# Patient Record
Sex: Female | Born: 1955 | Race: White | Hispanic: No | Marital: Married | Smoking: Former smoker
Health system: Southern US, Community
[De-identification: ages and names within clinical notes are randomized; demographics above are authoritative.]

## PROBLEM LIST (undated history)

## (undated) DIAGNOSIS — D219 Benign neoplasm of connective and other soft tissue, unspecified: Secondary | ICD-10-CM

## (undated) DIAGNOSIS — Z789 Other specified health status: Secondary | ICD-10-CM

## (undated) DIAGNOSIS — Z923 Personal history of irradiation: Secondary | ICD-10-CM

## (undated) DIAGNOSIS — Z808 Family history of malignant neoplasm of other organs or systems: Secondary | ICD-10-CM

## (undated) DIAGNOSIS — I10 Essential (primary) hypertension: Secondary | ICD-10-CM

## (undated) DIAGNOSIS — Z8049 Family history of malignant neoplasm of other genital organs: Secondary | ICD-10-CM

## (undated) DIAGNOSIS — Z800 Family history of malignant neoplasm of digestive organs: Secondary | ICD-10-CM

## (undated) DIAGNOSIS — R42 Dizziness and giddiness: Secondary | ICD-10-CM

## (undated) DIAGNOSIS — Z460 Encounter for fitting and adjustment of spectacles and contact lenses: Secondary | ICD-10-CM

## (undated) DIAGNOSIS — Z9889 Other specified postprocedural states: Secondary | ICD-10-CM

## (undated) DIAGNOSIS — R197 Diarrhea, unspecified: Secondary | ICD-10-CM

## (undated) DIAGNOSIS — R112 Nausea with vomiting, unspecified: Secondary | ICD-10-CM

## (undated) DIAGNOSIS — N83201 Unspecified ovarian cyst, right side: Secondary | ICD-10-CM

## (undated) DIAGNOSIS — C50911 Malignant neoplasm of unspecified site of right female breast: Secondary | ICD-10-CM

## (undated) HISTORY — PX: BLADDER SUSPENSION: SHX72

## (undated) HISTORY — PX: COLONOSCOPY: SHX174

## (undated) HISTORY — PX: TUBAL LIGATION: SHX77

## (undated) HISTORY — DX: Benign neoplasm of connective and other soft tissue, unspecified: D21.9

## (undated) HISTORY — DX: Diarrhea, unspecified: R19.7

## (undated) HISTORY — DX: Family history of malignant neoplasm of other organs or systems: Z80.8

## (undated) HISTORY — PX: CHOLECYSTECTOMY: SHX55

## (undated) HISTORY — DX: Family history of malignant neoplasm of digestive organs: Z80.0

## (undated) HISTORY — DX: Essential (primary) hypertension: I10

## (undated) HISTORY — DX: Unspecified ovarian cyst, right side: N83.201

## (undated) HISTORY — DX: Family history of malignant neoplasm of other genital organs: Z80.49

## (undated) HISTORY — DX: Dizziness and giddiness: R42

## (undated) HISTORY — PX: BREAST SURGERY: SHX581

---

## 1998-10-07 ENCOUNTER — Other Ambulatory Visit: Admission: RE | Admit: 1998-10-07 | Discharge: 1998-10-07 | Payer: Self-pay | Admitting: Obstetrics and Gynecology

## 1999-10-10 ENCOUNTER — Other Ambulatory Visit: Admission: RE | Admit: 1999-10-10 | Discharge: 1999-10-10 | Payer: Self-pay | Admitting: Obstetrics and Gynecology

## 1999-10-22 ENCOUNTER — Ambulatory Visit (HOSPITAL_COMMUNITY): Admission: RE | Admit: 1999-10-22 | Discharge: 1999-10-22 | Payer: Self-pay | Admitting: Obstetrics and Gynecology

## 2000-10-29 ENCOUNTER — Other Ambulatory Visit: Admission: RE | Admit: 2000-10-29 | Discharge: 2000-10-29 | Payer: Self-pay | Admitting: Obstetrics and Gynecology

## 2001-11-07 ENCOUNTER — Other Ambulatory Visit: Admission: RE | Admit: 2001-11-07 | Discharge: 2001-11-07 | Payer: Self-pay | Admitting: Obstetrics and Gynecology

## 2002-06-12 ENCOUNTER — Encounter: Admission: RE | Admit: 2002-06-12 | Discharge: 2002-06-12 | Payer: Self-pay | Admitting: Family Medicine

## 2002-06-12 ENCOUNTER — Encounter: Payer: Self-pay | Admitting: Family Medicine

## 2002-11-21 ENCOUNTER — Other Ambulatory Visit: Admission: RE | Admit: 2002-11-21 | Discharge: 2002-11-21 | Payer: Self-pay | Admitting: Obstetrics and Gynecology

## 2003-07-23 ENCOUNTER — Ambulatory Visit (HOSPITAL_BASED_OUTPATIENT_CLINIC_OR_DEPARTMENT_OTHER): Admission: RE | Admit: 2003-07-23 | Discharge: 2003-07-23 | Payer: Self-pay | Admitting: Urology

## 2003-07-23 ENCOUNTER — Observation Stay (HOSPITAL_COMMUNITY): Admission: EM | Admit: 2003-07-23 | Discharge: 2003-07-24 | Payer: Self-pay | Admitting: Urology

## 2003-08-13 ENCOUNTER — Emergency Department (HOSPITAL_COMMUNITY): Admission: EM | Admit: 2003-08-13 | Discharge: 2003-08-13 | Payer: Self-pay | Admitting: Emergency Medicine

## 2003-12-03 ENCOUNTER — Other Ambulatory Visit: Admission: RE | Admit: 2003-12-03 | Discharge: 2003-12-03 | Payer: Self-pay | Admitting: Obstetrics and Gynecology

## 2004-12-23 ENCOUNTER — Other Ambulatory Visit: Admission: RE | Admit: 2004-12-23 | Discharge: 2004-12-23 | Payer: Self-pay | Admitting: Obstetrics and Gynecology

## 2005-06-29 ENCOUNTER — Emergency Department (HOSPITAL_COMMUNITY): Admission: EM | Admit: 2005-06-29 | Discharge: 2005-06-30 | Payer: Self-pay | Admitting: Emergency Medicine

## 2005-06-29 IMAGING — CR DG FINGER THUMB 2+V*L*
3 series · 3 of 3 positions shown · non-contrast
Comparison: none

CLINICAL DATA: Horse bit thumb.
 LEFT THUMB ? 3 VIEW:
 There is no evidence of fracture or dislocation.  There is no evidence of arthropathy or other focal bone abnormality.  Soft tissues are unremarkable.

[view not recorded (1 of 3)]
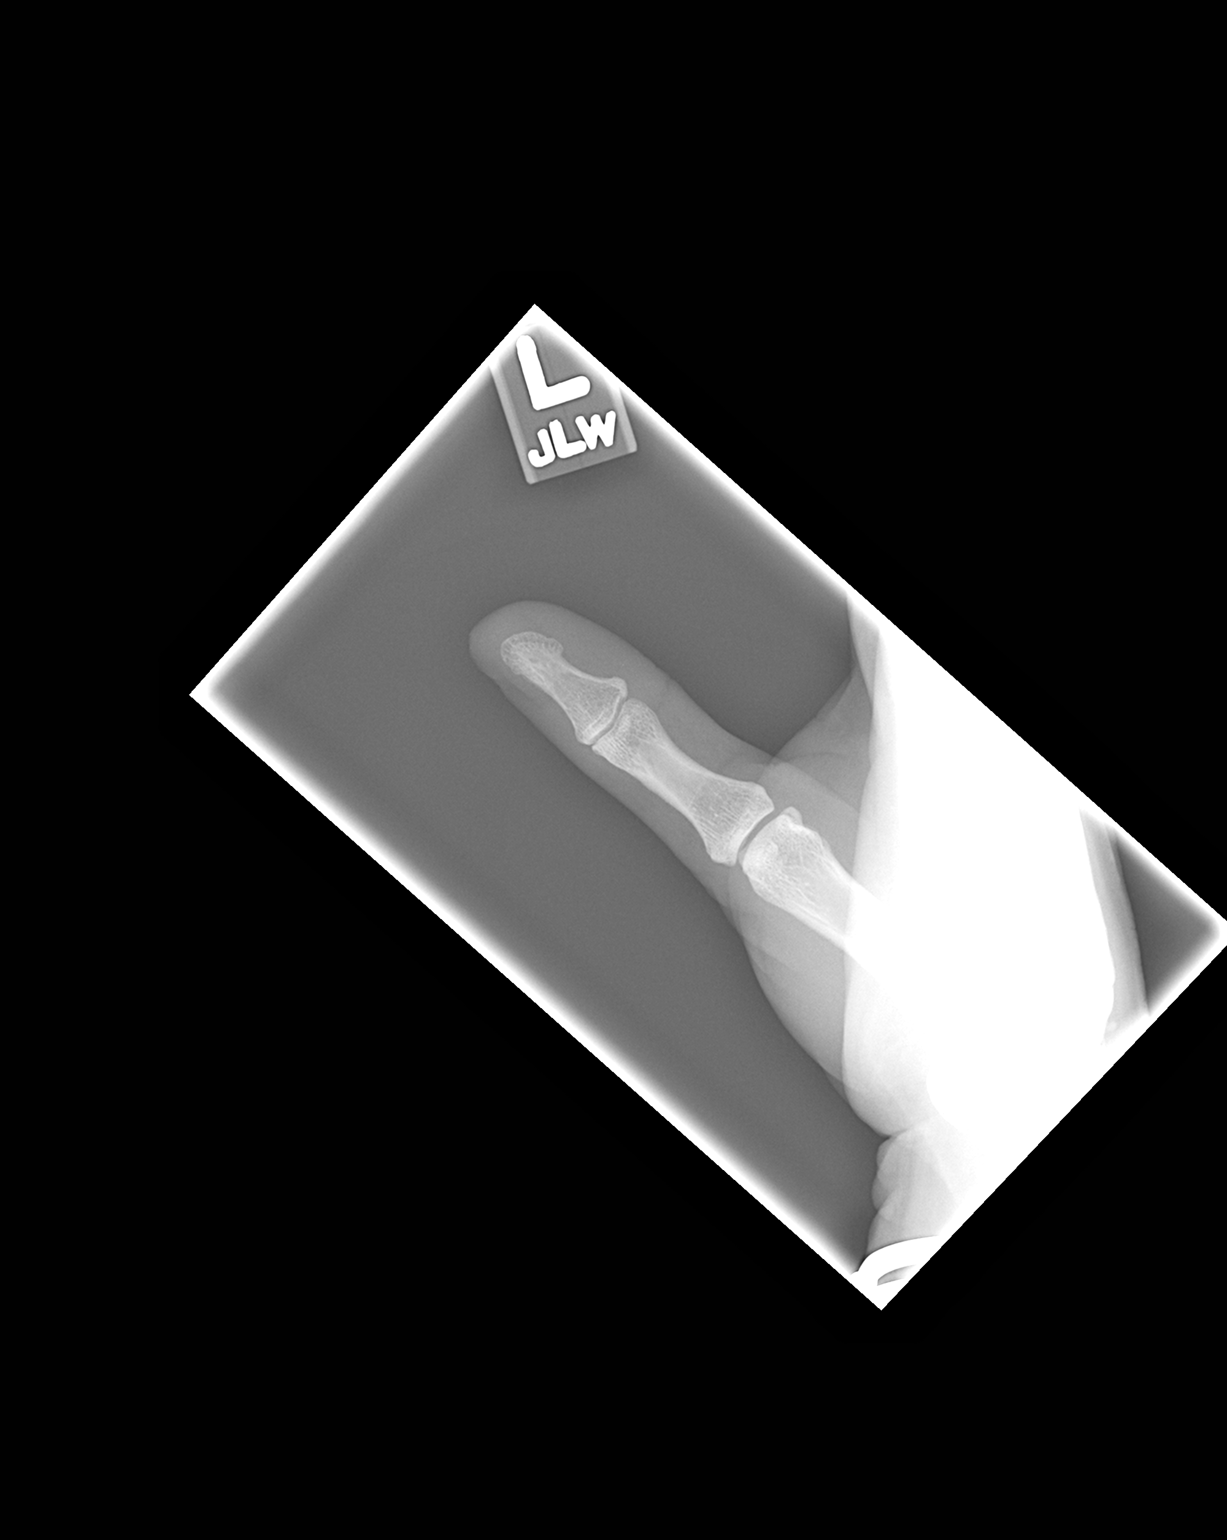

[view not recorded (2 of 3)]
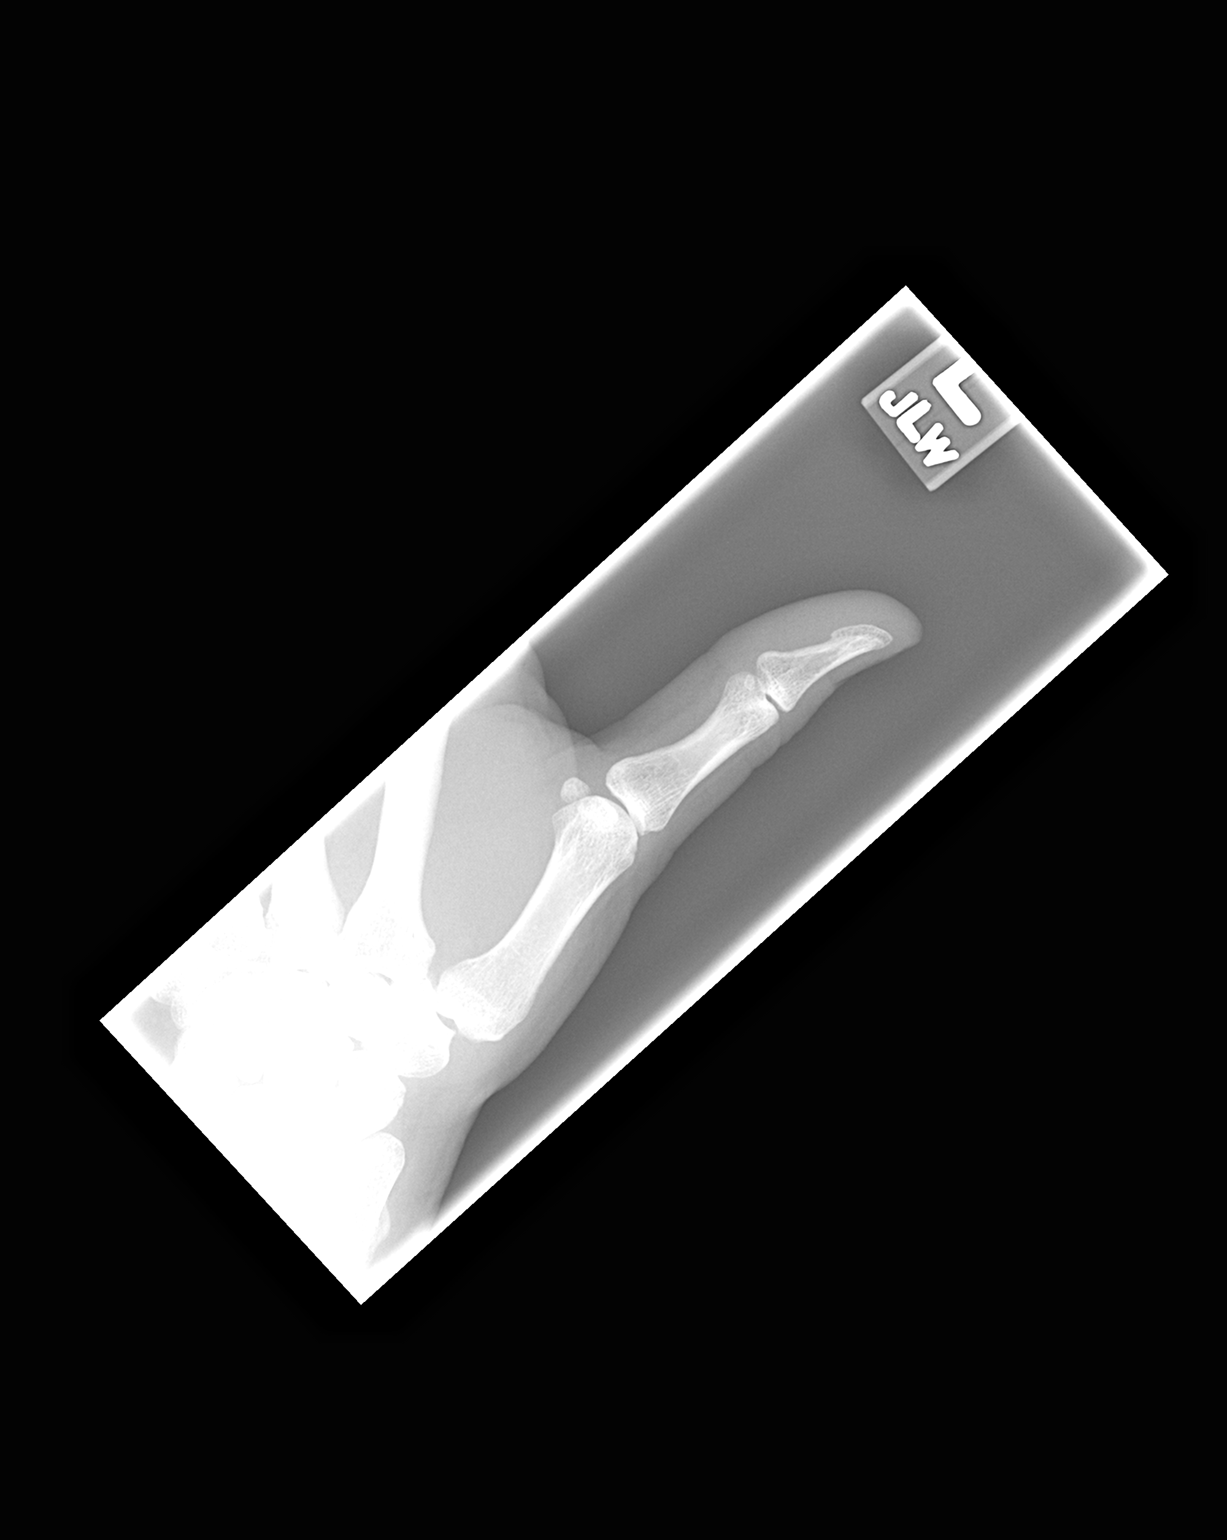

[view not recorded (3 of 3)]
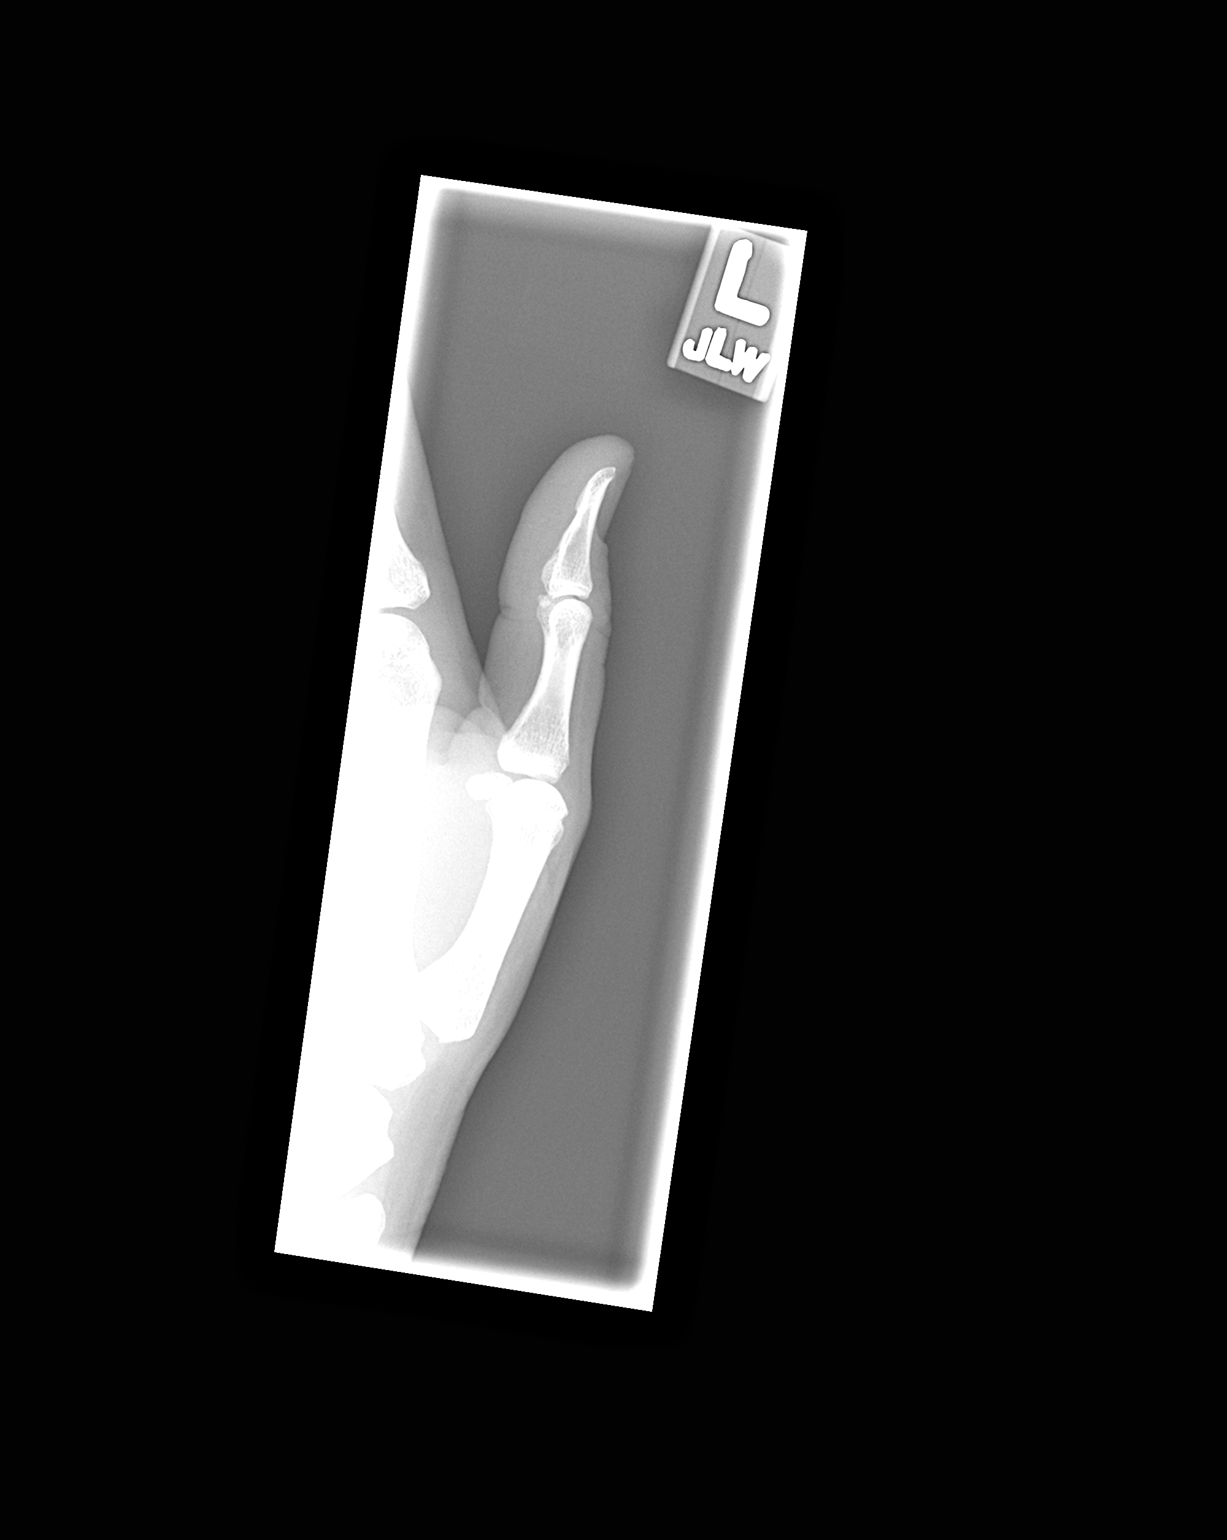

[3 of 3 positions shown; findings below may reference images not displayed]

IMPRESSION: Negative.

## 2006-12-13 ENCOUNTER — Ambulatory Visit: Payer: Self-pay | Admitting: Vascular Surgery

## 2007-01-18 ENCOUNTER — Ambulatory Visit: Payer: Self-pay | Admitting: Vascular Surgery

## 2007-01-25 ENCOUNTER — Ambulatory Visit: Payer: Self-pay | Admitting: Vascular Surgery

## 2007-12-01 ENCOUNTER — Ambulatory Visit: Payer: Self-pay | Admitting: Vascular Surgery

## 2008-12-19 IMAGING — CR DG HAND COMPLETE 3+V*R*
3 series · 3 of 3 positions shown · non-contrast
Comparison: None

CLINICAL DATA: Right hand injury.  Kicked by a horse.

RIGHT HAND - COMPLETE 3+ VIEW

[view not recorded (1 of 3)]
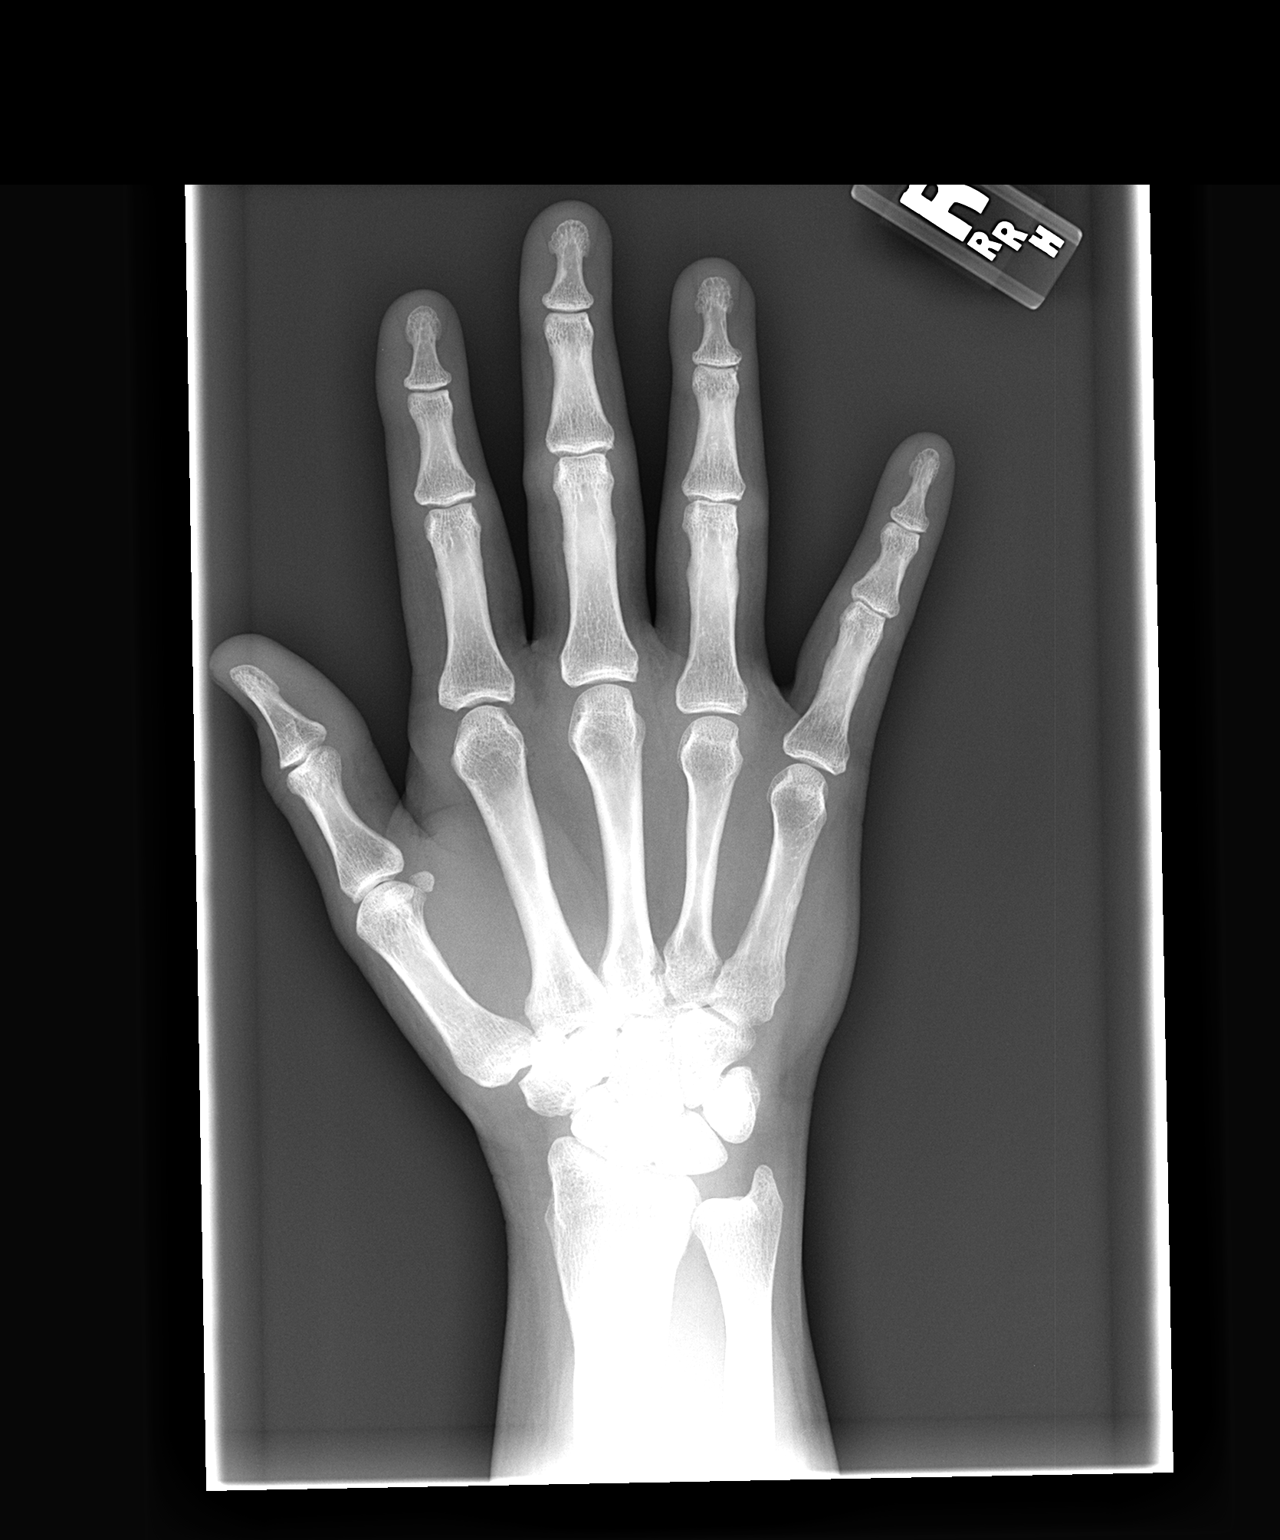

[view not recorded (2 of 3)]
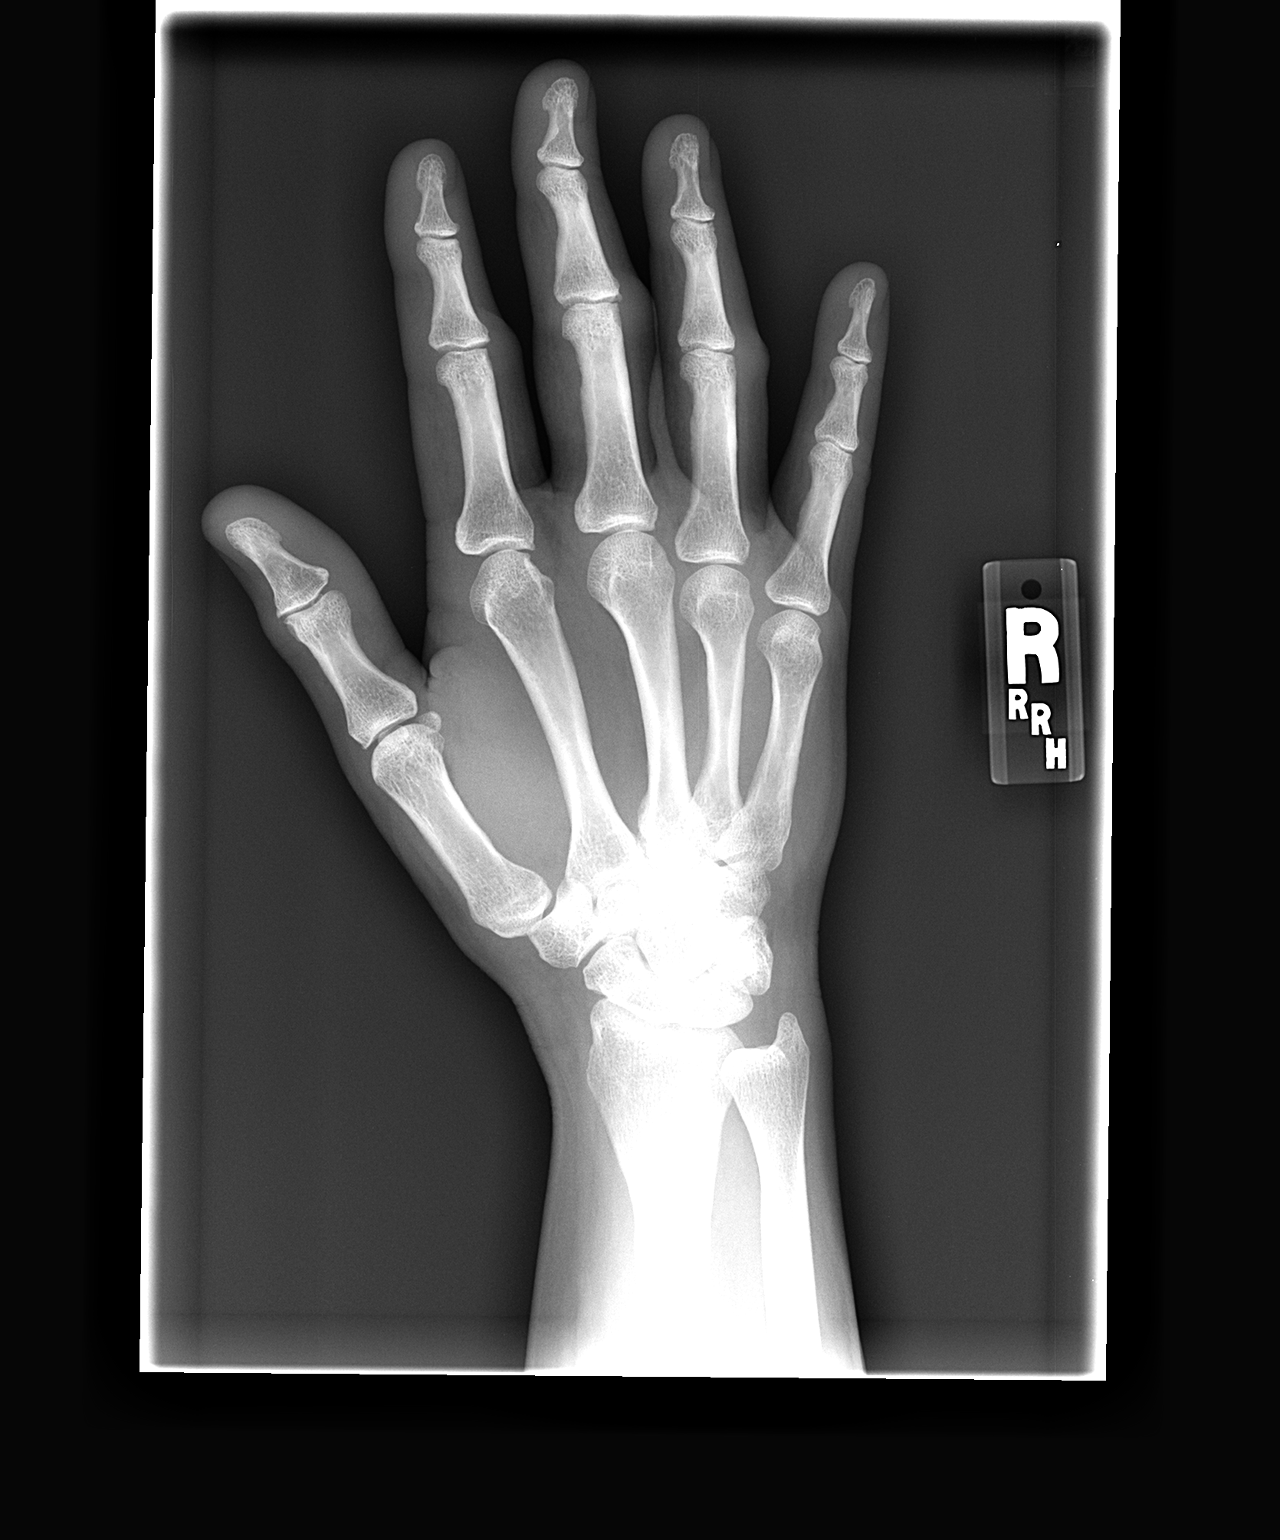

[view not recorded (3 of 3)]
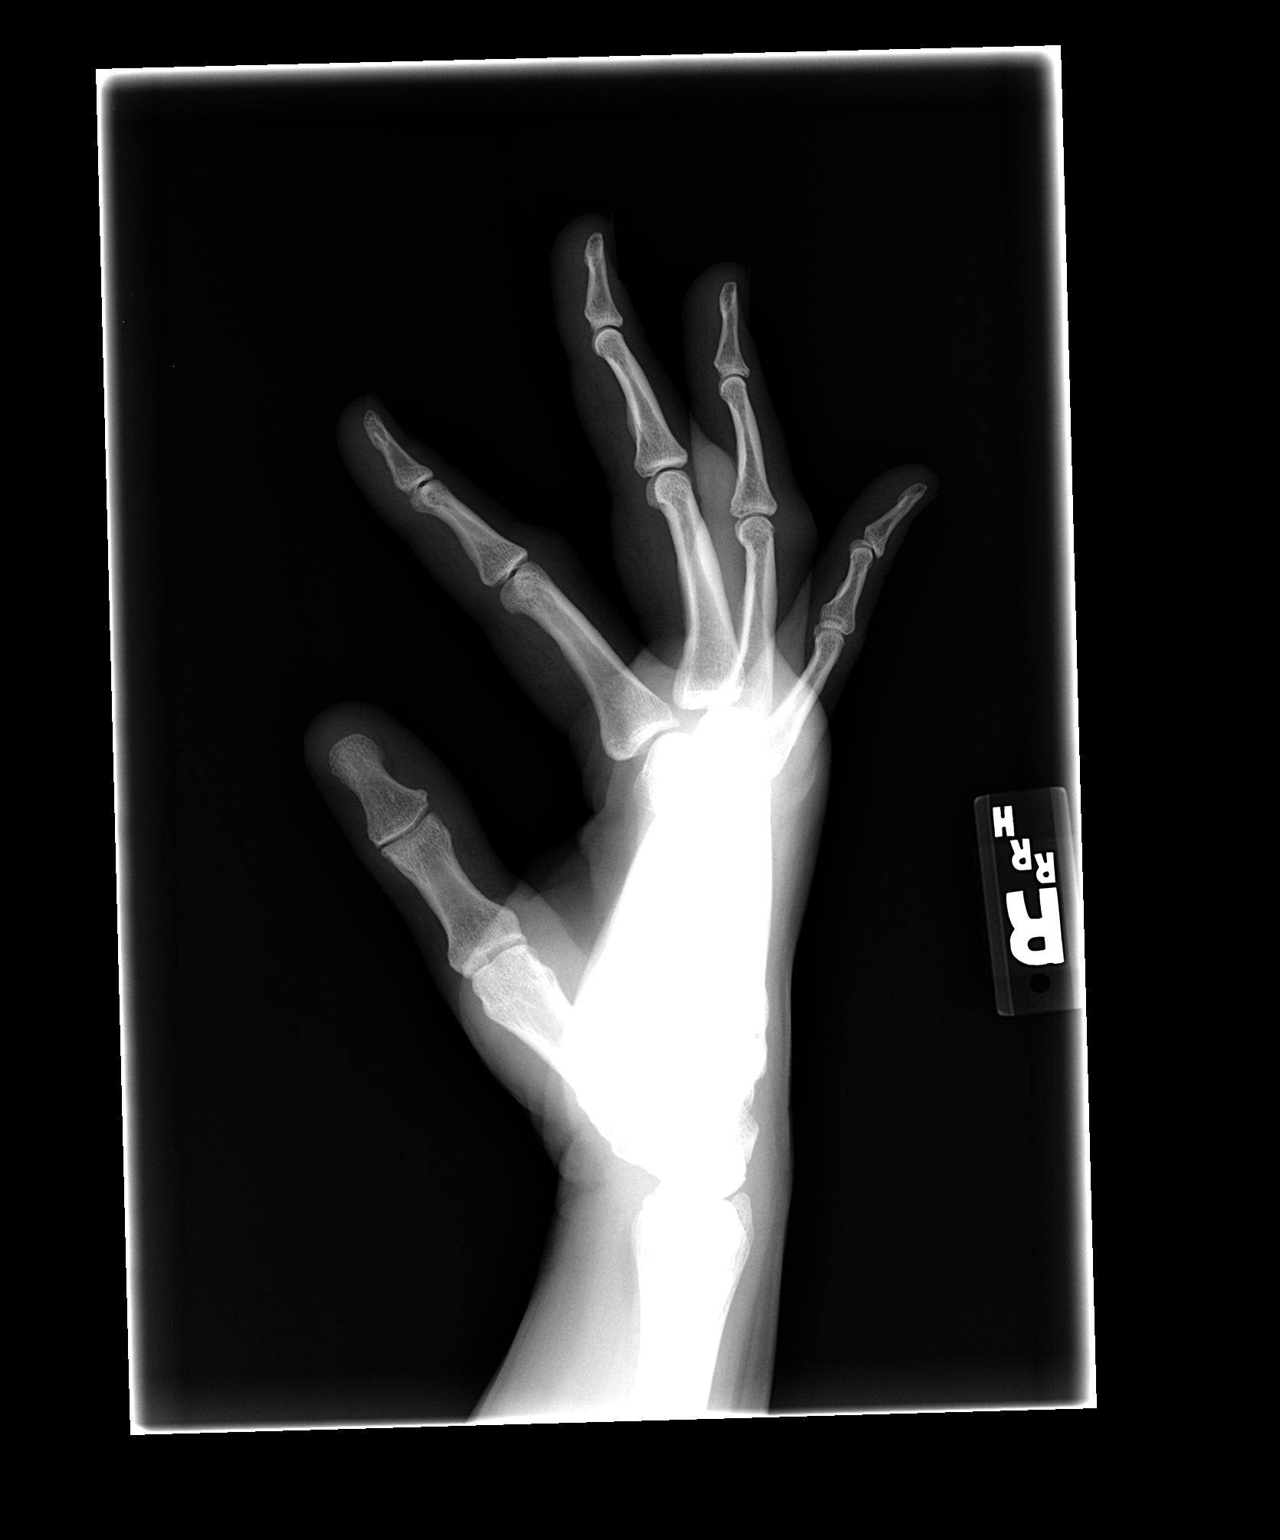

[3 of 3 positions shown; findings below may reference images not displayed]

FINDINGS: No fracture or subluxation.
IMPRESSION: Negative right hand.

## 2009-08-26 ENCOUNTER — Emergency Department (HOSPITAL_COMMUNITY): Admission: EM | Admit: 2009-08-26 | Discharge: 2009-08-26 | Payer: Self-pay | Admitting: Emergency Medicine

## 2011-01-16 NOTE — Op Note (Signed)
[facility]  Patient:    Theresa [name], Theresa [name]                        MRN: [id number] Proc. Date: 10/22/99 Adm. Date:  [id number] Attending:  Osborn Coho                           Operative Report  PREOPERATIVE DIAGNOSIS:       Patient desires permanent sterilization.  POSTOPERATIVE DIAGNOSIS:      Patient desires permanent sterilization.  OPERATION:                    Bilateral tubal ligation, application of Hulka clips.  SURGEON:                      Mark E. Dareen Piano, M.D.  ASSISTANT:  ANESTHESIA:                   General endotracheal anesthesia.  ANTIBIOTICS:                  Ancef 1 gram.  COMPLICATIONS:                None.  SPECIMENS:                    None.  ESTIMATED BLOOD LOSS:         Minimal.  FINDINGS:                     The patient has a normal appearing liver and gallbladder.  Appendix was not visualized.  There was no evidence of any adhesions or endometriosis in the pelvis or abdomen.  The patient had normal fallopian tubes and ovaries bilaterally.  Anterior and posterior cul-de-sac appeared to be normal. 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  DESCRIPTION OF PROCEDURE:     The patient was taken to the operating room where she was placed in the dorsal supine position.  A general anesthesia was administered without complications.  She was then placed in the dorsal lithotomy position and prepped with Hibiclens.  Her bladder was drained with a red rubber catheter.  A  Hulka tenaculum was applied to the anterior cervical lip.  The patient was then  draped in the usual fashion for this procedure.  The umbilicus was then injected with 0.25% Marcaine.  A vertical skin incision was made in the umbilicus.  The Veress needle was placed in the peritoneal cavity and 2 liters of carbon dioxide used to insufflate the abdomen.  The 12 mm trocar was the placed in the peritoneal cavity.  The scope  was then placed.  Examination of the abdominal and pelvic contents was then undertaken with findings as noted above.  A Hulka clip was then applied to the isthmic portion of the right fallopian tube.  The entire tube appeared to be within the clasp.  The clasp appeared to be perpendicular to the  tube.  A similar procedure was performed on the opposite side.  At this point, he Kleppinger was then placed through the scope and base of the pedunculated fibroids all cauterized.  At this point the procedure was concluded.  The instruments were removed, the pneumoperitoneum released.  The fascia was closed with interrupted 0 Vicryl suture.  The skin incision was closed with 4-0 Vicryl suture in  interrupted fashion.  The patient tolerated the procedure well.  She was taken to the recovery room in stable condition.  Sponge, needle, and instrument counts were correct x 1. DD:  10/22/99 TD:  10/22/99 Job: [id number]

## 2011-01-16 NOTE — Op Note (Signed)
NAME:  Theresa [name], Theresa [name]                           ACCOUNT NO.:  [id number]   MEDICAL RECORD NO.:  [id number]                   PATIENT TYPE:  AMB   LOCATION:  [facility]                                 FACILITY:  [facility]   PHYSICIAN:  Maretta Bees. Vonita Moss, M.D.             DATE OF BIRTH:  1956-03-22   DATE OF PROCEDURE:  07/23/2003  DATE OF DISCHARGE:                                 OPERATIVE REPORT   PREOPERATIVE DIAGNOSIS:  Stress urinary incontinence.   POSTOPERATIVE DIAGNOSIS:  Stress urinary incontinence.   PROCEDURE:  SPARC sling insertion.   SURGEON:  Maretta Bees. Vonita Moss, M.D.   ANESTHESIA:  General.   INDICATIONS:  This 55 year old white female has had a long history of stress  incontinence after delivery of a 7-pound boy when she was 55 years old.  She  wears pads and gets incontinence with exercise and it interferes with her  activities.  Urodynamics showed no uninhibited bladder contraction.  She had  high-pressure leakage consistent with SUI.  She is not in need of a  hysterectomy and has no significant cystocele or urethrocele.  She was  counseled about the therapeutic options including the [facility] sling, and  advised about the risks of hemorrhage, infection, erosion, or retention.   DESCRIPTION OF PROCEDURE:  The patient was brought to the operating room and  placed in lithotomy position.  External genitalia were prepped and draped in  the usual fashion.  The lower abdomen was also prepped.  A Foley catheter  was inserted and the bladder drained.  Xylocaine with epinephrine was  injected suburethrally after the Foley catheter was placed.  A midline  incision was made in the midurethra.  The vaginal mucosa was elevated on  each side of the urethra to allow dissection out to the endopelvic fascia.  She did have more bleeding from this area than usual and did require some  fulguration of small bleeders and some pressure put on the wound.  Suprapubic incisions were made on each side of  the midline and the SPARC  needle was passed, marching back down the back of the symphysis pubis on  each side of the bladder neck laterally.  The needle came out the endopelvic  fascia and brought it alongside the urethra.  She was then cystoscoped and  there was no evidence of injury or damage to the bladder.  The sling was  then placed in position by snapping it on the needles and bring it up  through the retropubic area.  With the Foley catheter back in place the  sling was placed in midurethra with a hemostat between the urethra and the  sling to put it in good position.  She had some more bleeding at this point  and I put some pelvic pressure on, which controlled that.  At this point  with hemostasis under control, the vaginal incision was closed with running  2-0 Vicryl.  Two cloth packs infiltrated with estrogen cream and antibiotic  ointment were placed in the vaginal canal, the Foley catheter was connected  to closed drainage, and the suprapubic wounds were covered with Ethibond.  I  feel appropriate leaving the pack overnight, and she lives [place] of the city  and has a child, and I think it best she stay overnight in 23-hour  observation for some decreased activity and ability and leaving the pack  overnight.                                               Maretta Bees. Vonita Moss, M.D.   LJP/MEDQ  D:  07/23/2003  T:  07/23/2003  Job:  [id number]

## 2012-08-29 ENCOUNTER — Ambulatory Visit (INDEPENDENT_AMBULATORY_CARE_PROVIDER_SITE_OTHER): Payer: BC Managed Care – PPO | Admitting: General Surgery

## 2012-08-29 ENCOUNTER — Encounter (INDEPENDENT_AMBULATORY_CARE_PROVIDER_SITE_OTHER): Payer: Self-pay | Admitting: General Surgery

## 2012-08-29 ENCOUNTER — Other Ambulatory Visit (INDEPENDENT_AMBULATORY_CARE_PROVIDER_SITE_OTHER): Payer: Self-pay | Admitting: General Surgery

## 2012-08-29 VITALS — BP 130/62 | HR 88 | Temp 97.6°F | Resp 16 | Ht 65.0 in | Wt 150.4 lb

## 2012-08-29 DIAGNOSIS — N63 Unspecified lump in unspecified breast: Secondary | ICD-10-CM

## 2012-08-29 NOTE — Progress Notes (Signed)
Patient ID: Theresa [name], female   DOB: [birth year], 56 y.o.   MRN: [id number]  Chief Complaint  Patient presents with  . New Evaluation    eval chest wall nodule    HPI Theresa [name] is a 56 y.o. female.   HPI  She is referred by Dr. Ouida Sills for evaluation of a left chest wall nodule. She is also noted a mass in the right lateral chest wall area. She noticed a mass in the superior aspect of her left breast/chest wall earlier this month. It has not changed in size. She also noticed a mass in the right lateral chest wall area that has not changed in size. These masses did not cause her any discomfort. She has had 2 benign breast biopsies in the past-fibrocystic changes. She is on hormone replacement therapy.  History reviewed. No pertinent past medical history.  Past Surgical History  Procedure Date  . Cholecystectomy   . Bladder suspension   . Breast surgery     lumpectomy x2    Family History  Problem Relation Age of Onset  . Cancer Father     colon    Social History History  Substance Use Topics  . Smoking status: Never Smoker   . Smokeless tobacco: Never Used  . Alcohol Use: No    No Known Allergies  Current Outpatient Prescriptions  Medication Sig Dispense Refill  . clonazePAM (KLONOPIN) 0.5 MG tablet       . OVER THE COUNTER MEDICATION Vitamin C, B complex & quercetin      . progesterone (PROMETRIUM) 100 MG capsule       . VIVELLE-DOT 0.05 MG/24HR         Review of Systems Review of Systems  Constitutional: Negative.   Respiratory: Negative.   Cardiovascular: Negative.   Gastrointestinal: Negative.   Genitourinary: Negative.   Neurological: Negative.   Hematological: Negative.     Blood pressure 130/62, pulse 88, temperature 97.6 F (36.4 C), temperature source Temporal, resp. rate 16, height 5\' 5"  (1.651 m), weight 150 lb 6.4 oz (68.221 kg).  Physical Exam Physical Exam  Constitutional: She appears well-developed and well-nourished. No distress.  HENT:   Head: Normocephalic and atraumatic.  Pulmonary/Chest:       Bilateral breast scars are present. At the 11:30 to 12:00 position of the left upper chest wall is 1.5 cm mass that is minimally mobile. No skin changes are present.   In the right lateral chest wall, there is a 3 cm soft mobile mass in the subcutaneous tissue.    Data Reviewed none  Assessment    1. Left breast/upper chest wall mass-by her report a mammogram done at [facility] was okay.  2. Newly discovered subcutaneous soft tissue mass right lateral chest wall    Plan    Obtain mammogram report from [place]. Monitor right lateral chest wall soft tissue mass at the become symptomatic or gets larger removal. Planned excision of left breast mass. The procedure and risks of the excision were explained to her and her husband. The risks include but are not but to bleeding, infection, wound healing problems, reaction to anesthesia. They seem to understand this and agree with the plan.       Theresa [name] 08/29/2012, 9:55 AM

## 2012-08-29 NOTE — Patient Instructions (Signed)
Monitor the size of the soft tissue mass in the right chest wall area.

## 2012-09-06 ENCOUNTER — Encounter (HOSPITAL_BASED_OUTPATIENT_CLINIC_OR_DEPARTMENT_OTHER): Payer: Self-pay | Admitting: *Deleted

## 2012-09-06 ENCOUNTER — Encounter (HOSPITAL_BASED_OUTPATIENT_CLINIC_OR_DEPARTMENT_OTHER)
Admission: RE | Admit: 2012-09-06 | Discharge: 2012-09-06 | Disposition: A | Discharge status: Home / Self Care | Payer: BC Managed Care – PPO | Source: Ambulatory Visit | Attending: General Surgery | Admitting: General Surgery

## 2012-09-06 LAB — COMPREHENSIVE METABOLIC PANEL
BUN: 10 mg/dL (ref 6–23)
Calcium: 9.2 mg/dL (ref 8.4–10.5)
GFR calc Af Amer: >90 mL/min (ref >90)
Glucose, Bld: 166 mg/dL — ABNORMAL HIGH (ref 70–99)
Sodium: 137 mEq/L (ref 135–145)
Total Protein: 6.9 g/dL (ref 6.0–8.3)

## 2012-09-06 LAB — CBC WITH DIFFERENTIAL/PLATELET
Eosinophils Absolute: 0.1 10*3/uL (ref 0.0–0.7)
Eosinophils Relative: 1 % (ref 0–5)
Lymphs Abs: 2.7 10*3/uL (ref 0.7–4.0)
MCH: 30.7 pg (ref 26.0–34.0)
MCHC: 33.3 g/dL (ref 30.0–36.0)
MCV: 92.2 fL (ref 78.0–100.0)
Platelets: 203 10*3/uL (ref 150–400)
RBC: 3.97 MIL/uL (ref 3.87–5.11)

## 2012-09-06 LAB — PROTIME-INR: Prothrombin Time: 12.6 seconds (ref 11.6–15.2)

## 2012-09-06 NOTE — Progress Notes (Signed)
To come in for [facility] lab orders

## 2012-09-08 ENCOUNTER — Ambulatory Visit (HOSPITAL_BASED_OUTPATIENT_CLINIC_OR_DEPARTMENT_OTHER)
Admission: RE | Admit: 2012-09-08 | Discharge: 2012-09-08 | Disposition: A | Discharge status: Home / Self Care | Payer: BC Managed Care – PPO | Source: Ambulatory Visit | Attending: General Surgery | Admitting: General Surgery

## 2012-09-08 ENCOUNTER — Encounter (HOSPITAL_BASED_OUTPATIENT_CLINIC_OR_DEPARTMENT_OTHER)
Admission: RE | Disposition: A | Discharge status: Home / Self Care | Payer: Self-pay | Source: Ambulatory Visit | Attending: General Surgery

## 2012-09-08 ENCOUNTER — Ambulatory Visit (HOSPITAL_BASED_OUTPATIENT_CLINIC_OR_DEPARTMENT_OTHER): Payer: BC Managed Care – PPO | Admitting: Anesthesiology

## 2012-09-08 ENCOUNTER — Encounter (HOSPITAL_BASED_OUTPATIENT_CLINIC_OR_DEPARTMENT_OTHER): Payer: Self-pay | Admitting: Anesthesiology

## 2012-09-08 ENCOUNTER — Encounter (HOSPITAL_BASED_OUTPATIENT_CLINIC_OR_DEPARTMENT_OTHER): Payer: Self-pay | Admitting: *Deleted

## 2012-09-08 DIAGNOSIS — F3289 Other specified depressive episodes: Secondary | ICD-10-CM | POA: Insufficient documentation

## 2012-09-08 DIAGNOSIS — N6009 Solitary cyst of unspecified breast: Secondary | ICD-10-CM | POA: Insufficient documentation

## 2012-09-08 DIAGNOSIS — N641 Fat necrosis of breast: Secondary | ICD-10-CM

## 2012-09-08 DIAGNOSIS — Z01812 Encounter for preprocedural laboratory examination: Secondary | ICD-10-CM | POA: Insufficient documentation

## 2012-09-08 DIAGNOSIS — F329 Major depressive disorder, single episode, unspecified: Secondary | ICD-10-CM | POA: Insufficient documentation

## 2012-09-08 HISTORY — PX: BREAST BIOPSY: SHX20

## 2012-09-08 HISTORY — DX: Other specified health status: Z78.9

## 2012-09-08 HISTORY — DX: Encounter for fitting and adjustment of spectacles and contact lenses: Z46.0

## 2012-09-08 LAB — POCT HEMOGLOBIN-HEMACUE: Hemoglobin: 12.5 g/dL (ref 12.0–15.0)

## 2012-09-08 SURGERY — BREAST BIOPSY
Anesthesia: General | Site: Breast | Laterality: Left | Wound class: Clean

## 2012-09-08 MED ORDER — OXYCODONE HCL 5 MG/5ML PO SOLN: 5.0000 mg | Freq: Once | ORAL | Status: DC | PRN

## 2012-09-08 MED ORDER — PROPOFOL 10 MG/ML IV BOLUS
INTRAVENOUS | Status: DC | PRN
  Administered 2012-09-08: 180 mg via INTRAVENOUS

## 2012-09-08 MED ORDER — ONDANSETRON HCL 4 MG/2ML IJ SOLN
INTRAMUSCULAR | Status: DC | PRN
  Administered 2012-09-08: 4 mg via INTRAVENOUS

## 2012-09-08 MED ORDER — FENTANYL CITRATE 0.05 MG/ML IJ SOLN: 50.0000 ug | Freq: Once | INTRAMUSCULAR | Status: DC

## 2012-09-08 MED ORDER — FENTANYL CITRATE 0.05 MG/ML IJ SOLN: 25.0000 ug | INTRAMUSCULAR | Status: DC | PRN

## 2012-09-08 MED ORDER — MIDAZOLAM HCL 5 MG/5ML IJ SOLN
INTRAMUSCULAR | Status: DC | PRN
  Administered 2012-09-08 (×2): 1 mg via INTRAVENOUS

## 2012-09-08 MED ORDER — PROMETHAZINE HCL 25 MG/ML IJ SOLN: 6.2500 mg | INTRAMUSCULAR | Status: DC | PRN

## 2012-09-08 MED ORDER — HYDROCODONE-ACETAMINOPHEN 5-325 MG PO TABS: 1.0000 | ORAL_TABLET | ORAL | Status: DC | PRN

## 2012-09-08 MED ORDER — CEFAZOLIN SODIUM-DEXTROSE 2-3 GM-% IV SOLR
2.0000 g | INTRAVENOUS | Status: AC
  Administered 2012-09-08: 2 g via INTRAVENOUS

## 2012-09-08 MED ORDER — SCOPOLAMINE 1 MG/3DAYS TD PT72
MEDICATED_PATCH | TRANSDERMAL | Status: DC | PRN
  Administered 2012-09-08: 1 via TRANSDERMAL

## 2012-09-08 MED ORDER — OXYCODONE HCL 5 MG PO TABS: 5.0000 mg | ORAL_TABLET | Freq: Once | ORAL | Status: DC | PRN

## 2012-09-08 MED ORDER — BUPIVACAINE HCL (PF) 0.5 % IJ SOLN
INTRAMUSCULAR | Status: DC | PRN
  Administered 2012-09-08: 9 mL

## 2012-09-08 MED ORDER — DEXAMETHASONE SODIUM PHOSPHATE 4 MG/ML IJ SOLN
INTRAMUSCULAR | Status: DC | PRN
  Administered 2012-09-08: 10 mg via INTRAVENOUS

## 2012-09-08 MED ORDER — MIDAZOLAM HCL 2 MG/2ML IJ SOLN: 1.0000 mg | INTRAMUSCULAR | Status: DC | PRN

## 2012-09-08 MED ORDER — FENTANYL CITRATE 0.05 MG/ML IJ SOLN
INTRAMUSCULAR | Status: DC | PRN
  Administered 2012-09-08: 50 ug via INTRAVENOUS
  Administered 2012-09-08 (×2): 25 ug via INTRAVENOUS

## 2012-09-08 MED ORDER — LACTATED RINGERS IV SOLN
INTRAVENOUS | Status: DC
  Administered 2012-09-08 (×2): via INTRAVENOUS

## 2012-09-08 MED ORDER — LIDOCAINE HCL (CARDIAC) 20 MG/ML IV SOLN
INTRAVENOUS | Status: DC | PRN
  Administered 2012-09-08: 60 mg via INTRAVENOUS

## 2012-09-08 SURGICAL SUPPLY — 44 items
BENZOIN TINCTURE PRP APPL 2/3 (GAUZE/BANDAGES/DRESSINGS) ×2 IMPLANT
BINDER BREAST LRG (GAUZE/BANDAGES/DRESSINGS) IMPLANT
BINDER BREAST MEDIUM (GAUZE/BANDAGES/DRESSINGS) IMPLANT
BINDER BREAST XLRG (GAUZE/BANDAGES/DRESSINGS) IMPLANT
BINDER BREAST XXLRG (GAUZE/BANDAGES/DRESSINGS) IMPLANT
BLADE SURG 10 STRL SS (BLADE) ×2 IMPLANT
CANISTER SUCTION 1200CC (MISCELLANEOUS) IMPLANT
CHLORAPREP W/TINT 26ML (MISCELLANEOUS) ×2 IMPLANT
CLOTH BEACON ORANGE TIMEOUT ST (SAFETY) ×2 IMPLANT
COVER MAYO STAND STRL (DRAPES) ×2 IMPLANT
COVER TABLE BACK 60X90 (DRAPES) ×2 IMPLANT
DECANTER SPIKE VIAL GLASS SM (MISCELLANEOUS) IMPLANT
DEVICE DUBIN W/COMP PLATE 8390 (MISCELLANEOUS) IMPLANT
DRAPE PED LAPAROTOMY (DRAPES) ×2 IMPLANT
DRAPE UTILITY XL STRL (DRAPES) ×2 IMPLANT
DRSG TEGADERM 4X4.75 (GAUZE/BANDAGES/DRESSINGS) ×2 IMPLANT
ELECT COATED BLADE 2.86 ST (ELECTRODE) ×2 IMPLANT
ELECT REM PT RETURN 9FT ADLT (ELECTROSURGICAL) ×2
ELECTRODE REM PT RTRN 9FT ADLT (ELECTROSURGICAL) ×1 IMPLANT
GAUZE SPONGE 4X4 12PLY STRL LF (GAUZE/BANDAGES/DRESSINGS) ×2 IMPLANT
GLOVE BIO SURGEON STRL SZ8 (GLOVE) ×2 IMPLANT
GLOVE BIOGEL PI IND STRL 8 (GLOVE) ×1 IMPLANT
GLOVE BIOGEL PI IND STRL 8.5 (GLOVE) ×2 IMPLANT
GLOVE BIOGEL PI INDICATOR 8 (GLOVE) ×1
GLOVE BIOGEL PI INDICATOR 8.5 (GLOVE) ×2
GLOVE ECLIPSE 8.0 STRL XLNG CF (GLOVE) ×4 IMPLANT
GOWN PREVENTION PLUS XLARGE (GOWN DISPOSABLE) ×4 IMPLANT
GOWN PREVENTION PLUS XXLARGE (GOWN DISPOSABLE) ×2 IMPLANT
NEEDLE HYPO 25X1 1.5 SAFETY (NEEDLE) ×2 IMPLANT
NS IRRIG 1000ML POUR BTL (IV SOLUTION) ×2 IMPLANT
PACK BASIN DAY SURGERY FS (CUSTOM PROCEDURE TRAY) ×2 IMPLANT
PENCIL BUTTON HOLSTER BLD 10FT (ELECTRODE) ×2 IMPLANT
SLEEVE SCD COMPRESS KNEE MED (MISCELLANEOUS) ×2 IMPLANT
SPONGE GAUZE 4X4 12PLY (GAUZE/BANDAGES/DRESSINGS) ×2 IMPLANT
STRIP CLOSURE SKIN 1/2X4 (GAUZE/BANDAGES/DRESSINGS) ×2 IMPLANT
SUT MON AB 4-0 PC3 18 (SUTURE) ×2 IMPLANT
SUT SILK 2 0 FS (SUTURE) ×2 IMPLANT
SUT VICRYL 3-0 CR8 SH (SUTURE) ×2 IMPLANT
SYR CONTROL 10ML LL (SYRINGE) ×2 IMPLANT
TOWEL OR 17X24 6PK STRL BLUE (TOWEL DISPOSABLE) ×4 IMPLANT
TOWEL OR NON WOVEN STRL DISP B (DISPOSABLE) ×2 IMPLANT
TUBE CONNECTING 20X1/4 (TUBING) IMPLANT
WATER STERILE IRR 1000ML POUR (IV SOLUTION) IMPLANT
YANKAUER SUCT BULB TIP NO VENT (SUCTIONS) IMPLANT

## 2012-09-08 NOTE — H&P (View-Only) (Signed)
Patient ID: [name], female   DOB: 02/15/1956, 56 y.o.   MRN: [id number]  Chief Complaint  Patient presents with  . New Evaluation    eval chest wall nodule    HPI Theresa [name] is a 56 y.o. female.   HPI  She is referred by Dr. Fagan for evaluation of a left chest wall nodule. She is also noted a mass in the right lateral chest wall area. She noticed a mass in the superior aspect of her left breast/chest wall earlier this month. It has not changed in size. She also noticed a mass in the right lateral chest wall area that has not changed in size. These masses did not cause her any discomfort. She has had 2 benign breast biopsies in the past-fibrocystic changes. She is on hormone replacement therapy.  History reviewed. No pertinent past medical history.  Past Surgical History  Procedure Date  . Cholecystectomy   . Bladder suspension   . Breast surgery     lumpectomy x2    Family History  Problem Relation Age of Onset  . Cancer Father     colon    Social History History  Substance Use Topics  . Smoking status: Never Smoker   . Smokeless tobacco: Never Used  . Alcohol Use: No    No Known Allergies  Current Outpatient Prescriptions  Medication Sig Dispense Refill  . clonazePAM (KLONOPIN) 0.5 MG tablet       . OVER THE COUNTER MEDICATION Vitamin C, B complex & quercetin      . progesterone (PROMETRIUM) 100 MG capsule       . VIVELLE-DOT 0.05 MG/24HR         Review of Systems Review of Systems  Constitutional: Negative.   Respiratory: Negative.   Cardiovascular: Negative.   Gastrointestinal: Negative.   Genitourinary: Negative.   Neurological: Negative.   Hematological: Negative.     Blood pressure 130/62, pulse 88, temperature 97.6 F (36.4 C), temperature source Temporal, resp. rate 16, height 5' 5" (1.651 m), weight 150 lb 6.4 oz (68.221 kg).  Physical Exam Physical Exam  Constitutional: She appears well-developed and well-nourished. No distress.  HENT:   Head: Normocephalic and atraumatic.  Pulmonary/Chest:       Bilateral breast scars are present. At the 11:30 to 12:00 position of the left upper chest wall is 1.5 cm mass that is minimally mobile. No skin changes are present.   In the right lateral chest wall, there is a 3 cm soft mobile mass in the subcutaneous tissue.    Data Reviewed none  Assessment    1. Left breast/upper chest wall mass-by her report a mammogram done at [facility] was okay.  2. Newly discovered subcutaneous soft tissue mass right lateral chest wall    Plan    Obtain mammogram report from [facility]. Monitor right lateral chest wall soft tissue mass at the become symptomatic or gets larger removal. Planned excision of left breast mass. The procedure and risks of the excision were explained to her and her husband. The risks include but are not but to bleeding, infection, wound healing problems, reaction to anesthesia. They seem to understand this and agree with the plan.       Lexus Shampine J 08/29/2012, 9:55 AM    

## 2012-09-08 NOTE — Anesthesia Procedure Notes (Signed)
Procedure Name: LMA Insertion Date/Time: 09/08/2012 7:41 AM Performed by: Katyana Trolinger D Pre-anesthesia Checklist: Patient identified, Emergency Drugs available, Suction available and Patient being monitored Patient Re-evaluated:Patient Re-evaluated prior to inductionOxygen Delivery Method: Circle System Utilized Preoxygenation: Pre-oxygenation with 100% oxygen Intubation Type: IV induction Ventilation: Mask ventilation without difficulty LMA: LMA inserted LMA Size: 4.0 Number of attempts: 1 Airway Equipment and Method: bite block Placement Confirmation: positive ETCO2 Tube secured with: Tape Dental Injury: Teeth and Oropharynx as per pre-operative assessment

## 2012-09-08 NOTE — Transfer of Care (Signed)
Immediate Anesthesia Transfer of Care Note  Patient: Theresa [name]  Procedure(s) Performed: Procedure(s) (LRB) with comments: BREAST BIOPSY (Left) - remove left breast mass  Patient Location: PACU  Anesthesia Type:General  Level of Consciousness: awake, alert , oriented and patient cooperative  Airway & Oxygen Therapy: Patient Spontanous Breathing and Patient connected to face mask oxygen  Post-op Assessment: Report given to PACU RN and Post -op Vital signs reviewed and stable  Post vital signs: Reviewed and stable  Complications: No apparent anesthesia complications

## 2012-09-08 NOTE — Anesthesia Preprocedure Evaluation (Signed)
Anesthesia Evaluation  Patient identified by MRN, date of birth, ID band Patient awake    Reviewed: Allergy & Precautions, H&P , NPO status , Patient's Chart, lab work & pertinent test results  Airway Mallampati: I TM Distance: >3 FB Neck ROM: Full    Dental   Pulmonary  breath sounds clear to auscultation        Cardiovascular Rhythm:Regular Rate:Normal     Neuro/Psych Depression    GI/Hepatic   Endo/Other    Renal/GU      Musculoskeletal   Abdominal   Peds  Hematology   Anesthesia Other Findings   Reproductive/Obstetrics                           Anesthesia Physical Anesthesia Plan  ASA: II  Anesthesia Plan: General   Post-op Pain Management:    Induction: Intravenous  Airway Management Planned: LMA  Additional Equipment:   Intra-op Plan:   Post-operative Plan: Extubation in OR  Informed Consent: I have reviewed the patients History and Physical, chart, labs and discussed the procedure including the risks, benefits and alternatives for the proposed anesthesia with the patient or authorized representative who has indicated his/her understanding and acceptance.     Plan Discussed with: CRNA and Surgeon  Anesthesia Plan Comments:         Anesthesia Quick Evaluation

## 2012-09-08 NOTE — Interval H&P Note (Signed)
History and Physical Interval Note:  09/08/2012 7:33 AM  Theresa [name]  has presented today for surgery, with the diagnosis of left breast mass  The various methods of treatment have been discussed with the patient and family. After consideration of risks, benefits and other options for treatment, the patient has consented to  Procedure(s) (LRB) with comments: BREAST BIOPSY (Left) - remove left breast mass as a surgical intervention .  The patient's history has been reviewed, patient examined, no change in status, stable for surgery.  I have reviewed the patient's chart and labs.  Questions were answered to the patient's satisfaction.     Manuella Blackson [place]la [place]

## 2012-09-08 NOTE — Op Note (Signed)
Operative Note  CELES DEDIC female 57 y.o. 09/08/2012  PREOPERATIVE DX:  Left breast mass  POSTOPERATIVE DX:  Same  PROCEDURE:  Excision of left breast mass         Surgeon: Adolph Pollack   Assistants: none  Anesthesia: General LMA anesthesia  Indications: This is a 57 year old female who developed a superior left breast mass that detectable by imaging studies but is present clinical exam. It is quite deep. She now presents for excisional biopsy. The procedure, risks, and aftercare were discussed with her preoperatively.    Procedure Detail:  She was seen in the holding area in the left breast marked with my initials. She is brought to the operating room placed supine on the operating table and a general anesthetic was administered. The left breast was sterilely prepped and draped. In the superior aspect of the breast a curvilinear incision was made through the skin and subcutaneous tissues. Skin flaps were raised in all directions. Using sharp dissection the mass was excised. Part of it was a cystic mass and some fluid escaped from the cyst capsule. Superior to the cystic mass was a solid mass. Both these areas were completely excised. The specimen was then sent to pathology.  The wound was inspected and bleeding was controlled with electrocautery. Once hemostasis was adequate local anesthetic consisting of 0.5% plain Marcaine was injected into the wound. The subcutaneous tissues were then reapproximated with interrupted 3-0 Vicryl sutures. The skin was closed with a 4-0 Monocryl subcuticular stitch. Steri-Strips and sterile dressings were applied.  She tolerated the procedure well without any apparent complications and was taken to the recovery room in satisfactory condition.    Findings: Solid and cystic mass  Estimated Blood Loss:  less than 100 mL         Drains: none  Blood Given: none          Specimens: Left breast mass        Complications:  * No complications  entered in OR log *         Disposition: PACU - hemodynamically stable.         Condition: stable

## 2012-09-08 NOTE — Progress Notes (Signed)
Patient states she was thrown off horse on Monday. Complains of pain on left side. Did not seek medical evaluation.

## 2012-09-08 NOTE — Anesthesia Postprocedure Evaluation (Signed)
  Anesthesia Post-op Note  Patient: Theresa [name]  Procedure(s) Performed: Procedure(s) (LRB) with comments: BREAST BIOPSY (Left) - remove left breast mass  Patient Location: PACU  Anesthesia Type:General  Level of Consciousness: awake and alert   Airway and Oxygen Therapy: Patient Spontanous Breathing  Post-op Pain: mild  Post-op Assessment: Post-op Vital signs reviewed, Patient's Cardiovascular Status Stable, Respiratory Function Stable, Patent Airway, No signs of Nausea or vomiting, Adequate PO intake and Pain level controlled  Post-op Vital Signs: stable  Complications: No apparent anesthesia complications

## 2012-09-09 ENCOUNTER — Encounter (HOSPITAL_BASED_OUTPATIENT_CLINIC_OR_DEPARTMENT_OTHER): Payer: Self-pay | Admitting: General Surgery

## 2012-09-09 ENCOUNTER — Encounter (INDEPENDENT_AMBULATORY_CARE_PROVIDER_SITE_OTHER): Payer: Self-pay | Admitting: General Surgery

## 2012-09-09 NOTE — Progress Notes (Signed)
Patient ID: Theresa [name], female   DOB: 1956/07/25, 57 y.o.   MRN: [id number] Her pathology demonstrates a benign cystic lesion with surrounding fatty necrosis. No evidence of malignancy. This was discussed with her today.

## 2012-09-26 ENCOUNTER — Ambulatory Visit (INDEPENDENT_AMBULATORY_CARE_PROVIDER_SITE_OTHER): Payer: BC Managed Care – PPO | Admitting: General Surgery

## 2012-09-26 ENCOUNTER — Encounter (INDEPENDENT_AMBULATORY_CARE_PROVIDER_SITE_OTHER): Payer: Self-pay | Admitting: General Surgery

## 2012-09-26 VITALS — BP 110/70 | HR 84 | Temp 98.3°F | Resp 18 | Ht 65.0 in | Wt 155.0 lb

## 2012-09-26 DIAGNOSIS — Z9889 Other specified postprocedural states: Secondary | ICD-10-CM

## 2012-09-26 NOTE — Patient Instructions (Signed)
Call if you have any problems with the incision. 

## 2012-09-26 NOTE — Progress Notes (Signed)
Procedure:  Excision of left breast mass  Date:  09/08/2012  Pathology:  Benign cystic lesion with fatty necrosis  History:  She is here for her first postoperative visit. She is aware of her pathology. She has had no problems with the incision.  Exam: General- Is in NAD. Left breast-incision is clean and intact.  Assessment:  Pathology is benign and the wound is healing well.  Plan:  Return visit as needed.

## 2013-03-13 ENCOUNTER — Encounter: Payer: Self-pay | Admitting: Obstetrics and Gynecology

## 2013-03-13 ENCOUNTER — Ambulatory Visit: Payer: Self-pay | Admitting: Obstetrics and Gynecology

## 2013-03-15 ENCOUNTER — Ambulatory Visit (INDEPENDENT_AMBULATORY_CARE_PROVIDER_SITE_OTHER): Payer: BC Managed Care – PPO | Admitting: Obstetrics and Gynecology

## 2013-03-15 ENCOUNTER — Encounter: Payer: Self-pay | Admitting: Obstetrics and Gynecology

## 2013-03-15 VITALS — BP 118/62 | Ht 63.75 in | Wt 158.0 lb

## 2013-03-15 DIAGNOSIS — Z0000 Encounter for general adult medical examination without abnormal findings: Secondary | ICD-10-CM

## 2013-03-15 DIAGNOSIS — N390 Urinary tract infection, site not specified: Secondary | ICD-10-CM

## 2013-03-15 DIAGNOSIS — Z01419 Encounter for gynecological examination (general) (routine) without abnormal findings: Secondary | ICD-10-CM

## 2013-03-15 LAB — POCT URINALYSIS DIPSTICK
Glucose, UA: NEGATIVE
Nitrite, UA: NEGATIVE
Urobilinogen, UA: NEGATIVE

## 2013-03-15 LAB — HEMOGLOBIN, FINGERSTICK: Hemoglobin, fingerstick: 12.8 g/dL (ref 12.0–16.0)

## 2013-03-15 MED ORDER — CLONAZEPAM 0.5 MG PO TABS: 0.5000 mg | ORAL_TABLET | Freq: Every evening | ORAL | Status: DC | PRN

## 2013-03-15 MED ORDER — PROGESTERONE MICRONIZED 100 MG PO CAPS: 100.0000 mg | ORAL_CAPSULE | Freq: Every day | ORAL | Status: DC

## 2013-03-15 MED ORDER — NITROFURANTOIN MONOHYD MACRO 100 MG PO CAPS: 100.0000 mg | ORAL_CAPSULE | Freq: Two times a day (BID) | ORAL | Status: DC

## 2013-03-15 MED ORDER — ESTRADIOL 0.05 MG/24HR TD PTTW: 1.0000 | MEDICATED_PATCH | TRANSDERMAL | Status: DC

## 2013-03-15 NOTE — Patient Instructions (Addendum)

## 2013-03-15 NOTE — Progress Notes (Signed)
57 y.o.   Married    Caucasian   female   G1P1001   here for annual exam.  Pt thinks she may have a UTI.  She has bladder spasms with urination and lower abd pain. Pt states she has them about once a year.  After sex, about 48 hours later, she sometimes will get a UTI.  She had sex about 6 days ago, and then sx's started last night.  Offered post coital prophylaxis and she accepted.  Pt states HRT has helped pain with sex, so now with post coital UTI, she doesn't want sex again.  Pt's sister has interstitial cystitis.    Patient's last menstrual period was 09/01/2007.          Sexually active: yes  The current method of family planning is tubal ligation.    Exercising: Horse back riding 5 days a  week, yoga 1 day a week Last mammogram:  03/02/12 neg Last pap smear:03/02/12 neg History of abnormal pap: no Smoking: quit  Smoking 17 years ago Alcohol: occ wine or beer Last colonoscopy: 05/2012 (2) polyps, repeat in 3 years. Tubulovillous adenoma Last Bone Density:  never Last tetanus shot:2010 Last cholesterol check: 2012 normal  Hgb:   12.8             Urine :Positive for tr pro and small leuk   Family History  Problem Relation Age of Onset  . Cancer Father     colon  . Hypertension Father   . Depression Mother   . Thyroid disease Sister     Patient Active Problem List   Diagnosis Date Noted  . Breast mass in female-left at 12:00-benign 08/29/2012    Past Medical History  Diagnosis Date  . No pertinent past medical history   . Contact lens/glasses fitting     wears contacts or glasses    Past Surgical History  Procedure Laterality Date  . Cholecystectomy    . Bladder suspension    . Tubal ligation    . Colonoscopy    . Breast surgery      lumpectomy x2  . Breast biopsy  09/08/2012    Procedure: BREAST BIOPSY;  Surgeon: Adolph Pollack, MD;  Location: [facility];  Service: General;  Laterality: Left;  remove left breast mass    Allergies: Review of  patient's allergies indicates no known allergies.  Current Outpatient Prescriptions  Medication Sig Dispense Refill  . clonazePAM (KLONOPIN) 0.5 MG tablet 0.5 mg at bedtime as needed.       Marland Kitchen OVER THE COUNTER MEDICATION Vitamin C, B complex & quercetin      . progesterone (PROMETRIUM) 100 MG capsule daily.       Marland Kitchen VIVELLE-DOT 0.05 MG/24HR 2 (two) times a week.        No current facility-administered medications for this visit.    ROS: Pertinent items are noted in HPI.  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    Exam:    BP 118/62  Ht 5' 3.75" (1.619 m)  Wt 158 lb (71.668 kg)  BMI 27.34 kg/m2  LMP 09/01/2007  Ht down 1 inch and wt up 2 pounds from last year Wt Readings from Last 3 Encounters:  03/15/13 158 lb (71.668 kg)  09/26/12 155 lb (70.308 kg)  09/08/12 155 lb 2 oz (70.364 kg)     Ht Readings from Last 3 Encounters:  03/15/13 5' 3.75" (1.619 m)  09/26/12  5\' 5"  (1.651 m)  09/08/12 5\' 5"  (1.651 m)    General appearance: alert, cooperative and appears stated age Head: Normocephalic, without obvious abnormality, atraumatic Neck: no adenopathy, supple, symmetrical, trachea midline and thyroid not enlarged, symmetric, no tenderness/mass/nodules Lungs: clear to auscultation bilaterally Breasts: Inspection negative, No nipple retraction or dimpling, No nipple discharge or bleeding, No axillary or supraclavicular adenopathy, Normal to palpation without dominant masses Heart: regular rate and rhythm Abdomen: soft, non-tender; bowel sounds normal; no masses,  no organomegaly Extremities: extremities normal, atraumatic, no cyanosis or edema Skin: Skin color, texture, turgor normal. No rashes or lesions Lymph nodes: Cervical, supraclavicular, and axillary nodes normal. No abnormal inguinal nodes palpated Neurologic: Grossly normal   Pelvic: External genitalia:  no lesions              Urethra:  normal appearing urethra with no  masses, tenderness or lesions              Bartholins and Skenes: normal                 Vagina: normal appearing vagina with normal color and discharge, no lesions              Cervix: normal appearance              Pap taken: no        Bimanual Exam:  Uterus:  uterus is normal size, shape, consistency and nontender                                      Adnexa: normal adnexa in size, nontender and no masses                                      Rectovaginal: Confirms                                      Anus:  normal sphincter tone, no lesions  A: normal menopausal exam, on HRT     Fh colon cancer in father     S/p three benign breast biopsies     P: mammogram counseled on breast self exam, mammography screening, adequate intake of calcium and vitamin D, diet and exercise return annually or prn       Pap due 2015 Discussed with pt that having 3 breast biopsies does put her at increased risk for breast cancer, and that the HRT may also put her at increased risk, and she states that the HRT helps her feel so much better, she'd rather accept the risk than have to stop the HRT.  RX's refilled. Discussed UTI and instructed in taking macrobid bid for 7 days, the one post coitally.     An After Visit Summary was printed and given to the patient.

## 2013-03-17 LAB — URINE CULTURE: Colony Count: NO GROWTH

## 2013-03-31 ENCOUNTER — Other Ambulatory Visit: Payer: Self-pay | Admitting: Certified Nurse Midwife

## 2013-10-16 ENCOUNTER — Other Ambulatory Visit: Payer: Self-pay | Admitting: Obstetrics and Gynecology

## 2013-10-16 NOTE — Telephone Encounter (Signed)
Last refilled: 03/15/13 #30/5 refills  Last AEX: 03/15/13  No current AEX scheduled  Okay to refill?  Please Advise.

## 2013-10-19 ENCOUNTER — Other Ambulatory Visit: Payer: Self-pay | Admitting: Obstetrics and Gynecology

## 2013-12-13 ENCOUNTER — Encounter: Payer: Self-pay | Admitting: Obstetrics and Gynecology

## 2014-03-07 ENCOUNTER — Other Ambulatory Visit: Payer: Self-pay | Admitting: Obstetrics & Gynecology

## 2014-03-08 NOTE — Telephone Encounter (Signed)
Last AEX: 03/15/13 Last refill: 10/16/2013 #30, 2 refill Current AEX: 03/30/14  Please advise

## 2014-03-12 ENCOUNTER — Other Ambulatory Visit: Payer: Self-pay | Admitting: *Deleted

## 2014-03-12 NOTE — Telephone Encounter (Signed)
I need pt's paper chart.  Doesn't look like she's been here in at least 18 months.

## 2014-03-12 NOTE — Telephone Encounter (Signed)
Can you call pharmacy and see who did last RF.  I can't see where it was [place] and the RF says not to exceed three before 8/15.  Thanks.

## 2014-03-12 NOTE — Telephone Encounter (Signed)
Incoming fax from [facility] requesting Clonazepam 0.5 mg.  - E-scribe encounter created by pharmacy routed to provider Dr. Sabra Heck. Rx still pending.

## 2014-03-14 NOTE — Telephone Encounter (Signed)
Rx faxed to [facility] Encounter closed

## 2014-03-14 NOTE — Telephone Encounter (Signed)
[name] last refill was on 11/28/13. The rx was written by Dr. Sabra Heck on 10/16/13. Theresa [name] stated they don't have the message stating not to exceed 3 before 04/14/14.

## 2014-03-16 ENCOUNTER — Ambulatory Visit: Payer: BC Managed Care – PPO | Admitting: Obstetrics and Gynecology

## 2014-03-22 ENCOUNTER — Telehealth: Payer: Self-pay | Admitting: Obstetrics and Gynecology

## 2014-03-22 NOTE — Telephone Encounter (Signed)
Need to reschedule patients appt

## 2014-03-22 NOTE — Telephone Encounter (Signed)
Patient called back and was upset when i told her we had to reschedule her appt. She stated she have rescheduled her appt multiple times already. i apologized to the reschedules. i got her scheduled to her next available time 04/27/14 at 11:00

## 2014-03-22 NOTE — Telephone Encounter (Signed)
Thank you I understand

## 2014-03-22 NOTE — Telephone Encounter (Signed)
I don't understand why the patient's appointment for 03/30/14 was rescheduled. Can you help me to understand?

## 2014-03-22 NOTE — Telephone Encounter (Signed)
We were told to reschedule a lot of the aex for that day by sally

## 2014-03-30 ENCOUNTER — Ambulatory Visit: Payer: BC Managed Care – PPO | Admitting: Obstetrics and Gynecology

## 2014-04-03 ENCOUNTER — Telehealth: Payer: Self-pay | Admitting: *Deleted

## 2014-04-03 MED ORDER — ESTRADIOL 0.05 MG/24HR TD PTTW: 1.0000 | MEDICATED_PATCH | TRANSDERMAL | Status: DC

## 2014-04-03 NOTE — Telephone Encounter (Signed)
[facility].05 mg  Last Refilled: 03/15/13 #8 patches with 12 refills by Dr. Joan Flores Last Mammogram; 05/09/13 Bi-rads 2 Aex Scheduled: 04/27/14 with Dr. Quincy Simmonds  Estradiol 0.05 mg #8 patches with no refills sent to pharmacy to last patient until AEX.  Routed to provider for review, encounter closed.

## 2014-04-05 ENCOUNTER — Other Ambulatory Visit: Payer: Self-pay | Admitting: *Deleted

## 2014-04-05 MED ORDER — PROGESTERONE MICRONIZED 100 MG PO CAPS: 100.0000 mg | ORAL_CAPSULE | Freq: Every day | ORAL | Status: DC

## 2014-04-05 NOTE — Telephone Encounter (Signed)
Faxed refill request received from [facility] for PROGESTERONE Last filled by MD on 03/15/13, #30 X 12 Last AEX - 03/15/13 Last MMG - 05/09/13, normal Next AEX - 04/27/14 RX sent until next AEX.

## 2014-04-20 ENCOUNTER — Telehealth: Payer: Self-pay | Admitting: Obstetrics and Gynecology

## 2014-04-20 NOTE — Telephone Encounter (Signed)
Confirming pts appt °

## 2014-04-27 ENCOUNTER — Ambulatory Visit (INDEPENDENT_AMBULATORY_CARE_PROVIDER_SITE_OTHER): Payer: BC Managed Care – PPO | Admitting: Obstetrics and Gynecology

## 2014-04-27 ENCOUNTER — Encounter: Payer: Self-pay | Admitting: Obstetrics and Gynecology

## 2014-04-27 VITALS — BP 140/80 | HR 70 | Resp 14 | Ht 64.5 in | Wt 155.4 lb

## 2014-04-27 DIAGNOSIS — Z01419 Encounter for gynecological examination (general) (routine) without abnormal findings: Secondary | ICD-10-CM

## 2014-04-27 DIAGNOSIS — Z0000 Encounter for general adult medical examination without abnormal findings: Secondary | ICD-10-CM

## 2014-04-27 DIAGNOSIS — N841 Polyp of cervix uteri: Secondary | ICD-10-CM

## 2014-04-27 LAB — POCT URINALYSIS DIPSTICK
BILIRUBIN UA: NEGATIVE
Blood, UA: NEGATIVE
GLUCOSE UA: NEGATIVE
Ketones, UA: NEGATIVE
LEUKOCYTES UA: NEGATIVE
NITRITE UA: NEGATIVE
PH UA: 5
Protein, UA: NEGATIVE
Urobilinogen, UA: NEGATIVE

## 2014-04-27 MED ORDER — CLONAZEPAM 0.5 MG PO TABS: ORAL_TABLET | ORAL | Status: DC

## 2014-04-27 MED ORDER — ESTRADIOL 0.05 MG/24HR TD PTTW: 1.0000 | MEDICATED_PATCH | TRANSDERMAL | Status: DC

## 2014-04-27 MED ORDER — PROGESTERONE MICRONIZED 100 MG PO CAPS: 100.0000 mg | ORAL_CAPSULE | Freq: Every day | ORAL | Status: DC

## 2014-04-27 NOTE — Progress Notes (Signed)
Patient ID: Theresa [name], female   DOB: 02/16/1956, 58 y.o.   MRN: [id number] GYNECOLOGY VISIT  PCP:   [name], MD  Referring provider:   HPI: 58 y.o.   Married  Caucasian  female   G1P1001 with Patient's last menstrual period was 09/01/2007.   here for   AEX.  Decreased libido.  Occasional hot flash.  Taking HRT Vivelle Dot and Prometrium.  Has fibrocystic breasts.   Has post coital UTIs and takes Macrobid for it.  Still has several at home. Will call if runs out.  Not sexually active very often.    Having vertigo and saw PCP.  Received Rx for Diazepam.  Never took it.  Having left ear pain.  Does not mix diazepam and clonazepam together.  Takes clonazepam for sleep prn.   Has plantar fascitis. Difficult to exercise.   Dr. Collene Mares treating diarrhea.   Not enjoying retirement.   Hgb:     PCP Urine:   Neg  GYNECOLOGIC HISTORY: Patient's last menstrual period was 09/01/2007. Sexually active:  yes Partner preference: female Contraception: Tubal   Menopausal hormone therapy:  Vivelle Dot 0.05mg  and Prometrium. DES exposure:  no  Blood transfusions:  no  Sexually transmitted diseases:  no  GYN procedures and prior surgeries:  Tubal ligation, left breast lumpectomy-benign, bladder suspension Last mammogram:  05-04-13 dense breasts, otherwise normal:[facility].  Scheduled for Sept. 4, 2015.           Last pap and high risk HPV testing: 03-02-12 wnl  And negative HR HPV.  History of abnormal pap smear:  no   OB History   Grav Para Term Preterm Abortions TAB SAB Ect Mult Living   1 1 1       1        LIFESTYLE: Exercise:     Walking,yoga           OTHER HEALTH MAINTENANCE: Tetanus/TDap: 2010 HPV:                 n/a Influenza:          never   Bone density:   n/a Colonoscopy:   05/2012 polyps.  Next colonoscopy due10/2016 with Dr. Collene Mares.  Cholesterol check: 2years ago normal with PCP  Family History  Problem Relation Age of Onset  . Cancer Father     colon  .  Hypertension Father   . Depression Mother   . Thyroid disease Sister     Patient Active Problem List   Diagnosis Date Noted  . Breast mass in female-left at 12:00-benign 08/29/2012   Past Medical History  Diagnosis Date  . No pertinent past medical history   . Contact lens/glasses fitting     wears contacts or glasses    Past Surgical History  Procedure Laterality Date  . Cholecystectomy    . Bladder suspension    . Tubal ligation    . Colonoscopy    . Breast surgery      lumpectomy x2  . Breast biopsy  09/08/2012    Procedure: BREAST BIOPSY;  Surgeon: Odis Hollingshead, MD;  Location: [facility];  Service: General;  Laterality: Left;  remove left breast mass    ALLERGIES: Review of patient's allergies indicates no known allergies.  Current Outpatient Prescriptions  Medication Sig Dispense Refill  . clonazePAM (KLONOPIN) 0.5 MG tablet TAKE 1 TABLET BY MOUTH AT BEDTIME AS NEEDED  30 tablet  1  . estradiol (VIVELLE-DOT) 0.05 MG/24HR patch Place 1  patch (0.05 mg total) onto the skin 2 (two) times a week.  8 patch  0  . nitrofurantoin, macrocrystal-monohydrate, (MACROBID) 100 MG capsule Take 1 capsule (100 mg total) by mouth 2 (two) times daily. Then 1 po after sex.  30 capsule  1  . OVER THE COUNTER MEDICATION Vitamin C, B complex & quercetin      . progesterone (PROMETRIUM) 100 MG capsule Take 1 capsule (100 mg total) by mouth daily.  30 capsule  0   No current facility-administered medications for this visit.     ROS:  Pertinent items are noted in HPI.  History   Social History  . Marital Status: Married    Spouse Name: N/A    Number of Children: N/A  . Years of Education: N/A   Occupational History  . Not on file.   Social History Main Topics  . Smoking status: Former Smoker    Types: Cigarettes    Quit date: 03/15/1996  . Smokeless tobacco: Never Used  . Alcohol Use: Yes  . Drug Use: No  . Sexual Activity: Yes    Partners: Male    Birth  Control/ Protection: Post-menopausal, Surgical     Comment: BTL   Other Topics Concern  . Not on file   Social History Narrative  . No narrative on file    PHYSICAL EXAMINATION:    BP 140/80  Pulse 70  Resp 14  Ht 5' 4.5" (1.638 m)  Wt 155 lb 6.4 oz (70.489 kg)  BMI 26.27 kg/m2  LMP 09/01/2007   Wt Readings from Last 3 Encounters:  04/27/14 155 lb 6.4 oz (70.489 kg)  03/15/13 158 lb (71.668 kg)  09/26/12 155 lb (70.308 kg)     Ht Readings from Last 3 Encounters:  04/27/14 5' 4.5" (1.638 m)  03/15/13 5' 3.75" (1.619 m)  09/26/12 5\' 5"  (1.651 m)    General appearance: alert, cooperative and appears stated age Head: Normocephalic, without obvious abnormality, atraumatic Neck: no adenopathy, supple, symmetrical, trachea midline and thyroid not enlarged, symmetric, no tenderness/mass/nodules Lungs: clear to auscultation bilaterally Breasts: Inspection negative on right, left breast scattered scars, No nipple retraction or dimpling, No nipple discharge or bleeding, No axillary or supraclavicular adenopathy, Normal to palpation without dominant masses Heart: regular rate and rhythm Abdomen: soft, non-tender; no masses,  no organomegaly Extremities: extremities normal, atraumatic, no cyanosis or edema Skin: Skin color, texture, turgor normal. No rashes or lesions Lymph nodes: Cervical, supraclavicular, and axillary nodes normal. No abnormal inguinal nodes palpated Neurologic: Grossly normal  Pelvic: External genitalia:  no lesions              Urethra:  normal appearing urethra with no masses, tenderness or lesions              Bartholins and Skenes: normal                 Vagina: normal appearing vagina with normal color and discharge, no lesions              Cervix: normal appearance. Cervical polyp noted and removed with ring forceps after verbal permission. Sent to pathology.  Minimal EBL.  No complications.               Pap and high risk HPV testing done: No.         Bimanual Exam:  Uterus:  uterus is retroverted and 8 week size, shape, consistency and nontender  Adnexa: normal adnexa in size, nontender and no masses                                      Rectovaginal:  Yes.                                        Confirms above.                                      Anus:  normal sphincter tone, no lesions  ASSESSMENT  Normal gynecologic exam. Uterine fibroids.  HRT patient.  Cervical poly removed.  Vertigo and left ear pain.  Sleep disturbance.  Post coital UTIs.  PLAN  Mammogram recommended yearly starting at age 65. Pap smear and high risk HPV testing as above. Counseled on self breast exam, Calcium and vitamin D intake, exercise. See lab orders: Yes.  polyp to pathology.  Refill Vivelle Dot and Prometrium.  See orders.  Discussed risks of DTV, PE, MI, stroke, and breast cancer.  Refill clonazepam #30, RF 1. Patient will contact her PCP to have referral to ENT.  Continue Macrobid post coital.  Has Rx.  Return annually or prn   An After Visit Summary was printed and given to the patient.

## 2014-04-27 NOTE — Patient Instructions (Signed)

## 2014-05-01 LAB — IPS OTHER TISSUE BIOPSY

## 2014-07-02 ENCOUNTER — Encounter: Payer: Self-pay | Admitting: Obstetrics and Gynecology

## 2014-07-31 ENCOUNTER — Other Ambulatory Visit: Payer: Self-pay | Admitting: Obstetrics and Gynecology

## 2014-07-31 NOTE — Telephone Encounter (Signed)
Patient is asking for a refill of clonazePAM (KLONOPIN) 0.5 MG tablet. Pharmacy on file.

## 2014-07-31 NOTE — Telephone Encounter (Signed)
Last AEX and refill  04/27/14 #30/ 1Refills Next appt 05/02/15  Please advise.

## 2014-08-02 MED ORDER — CLONAZEPAM 0.5 MG PO TABS: ORAL_TABLET | ORAL | Status: DC

## 2014-08-02 NOTE — Telephone Encounter (Signed)
Phone note closed in error. Patient calling to check on status of refill. Please advise?

## 2014-08-02 NOTE — Telephone Encounter (Signed)
rx has been faxed 

## 2014-12-11 ENCOUNTER — Other Ambulatory Visit: Payer: Self-pay | Admitting: Obstetrics and Gynecology

## 2014-12-11 NOTE — Telephone Encounter (Signed)
Medication refill request: Klonopin Last AEX:  04/27/14 Dr. Quincy Simmonds Next AEX: 05/02/15 Dr. Quincy Simmonds Last MMG (if hormonal medication request): 05/11/14 BIRADS2:Benign  Refill authorized: 08/02/14 #30/1R. Today please advise.  Routed to Dr. Sabra Heck

## 2014-12-13 ENCOUNTER — Telehealth: Payer: Self-pay | Admitting: Obstetrics and Gynecology

## 2014-12-13 NOTE — Telephone Encounter (Signed)
Pt checking on refill request

## 2014-12-14 ENCOUNTER — Telehealth: Payer: Self-pay | Admitting: Cardiovascular Disease

## 2014-12-14 NOTE — Telephone Encounter (Signed)
Spoke to patient to advise refill was faxed on 12/13/14.  She states she received automated call from pharmacy yesterday to let her know RX was ready.

## 2014-12-18 NOTE — Telephone Encounter (Signed)
4/16 - Patient returned call and verified appointment.

## 2014-12-19 NOTE — Progress Notes (Signed)
Cardiology Office Note   Date:  12/19/2014   ID:  Theresa [name], DOB 1955/12/28, MRN [id number]  PCP:  [name], MD  Cardiologist:   Jenkins Rouge, MD   No chief complaint on file.     History of Present Illness: Theresa [name] is a 59 y.o. female who presents for abnormal ECG and need for clearance for job.  Previous smoker quit in 97  Previous left breast biopsy And excision of benign mass by Dr Zella Richer in 2014  No HTN, DM and Cholesterol status unknown Denies cardiac history Reviewed ECG from physical And it was poor quality with lots of artifact and read as possible old anterior MI just due to poor R wave progression  She is active retired Garment/textile technologist.  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  Has horses at home and is very active with no cardiac symptoms   Labs reviewed 10/21/14  LDL 67  K 4.7  Cr .61  Hct 40.4  HDL 60      Past Medical History  Diagnosis Date  . No pertinent past medical history   . Contact lens/glasses fitting     wears contacts or glasses  . Vertigo   . Diarrhea     --post gallbladder surgery    Past Surgical History  Procedure Laterality Date  . Cholecystectomy    . Bladder suspension    . Tubal ligation    . Colonoscopy    . Breast surgery      lumpectomy x2  . Breast biopsy  09/08/2012    Procedure: BREAST BIOPSY;  Surgeon: Odis Hollingshead, MD;  Location: [facility];  Service: General;  Laterality: Left;  remove left breast mass     Current Outpatient Prescriptions  Medication Sig Dispense Refill  . cholestyramine (QUESTRAN) 4 G packet     . clonazePAM (KLONOPIN) 0.5 MG tablet TAKE 1 TABLET BY MOUTH AT BEDTIME AS NEEDED 30 tablet 1  . diazepam (VALIUM) 2 MG tablet     . estradiol (VIVELLE-DOT) 0.05 MG/24HR patch Place 1 patch (0.05 mg total) onto the skin 2 (two) times a week. 8 patch 11  . nitrofurantoin, macrocrystal-monohydrate, (MACROBID) 100 MG capsule Take 1 capsule (100 mg total) by mouth 2 (two) times daily.  Then 1 po after sex. 30 capsule 1  . OVER THE COUNTER MEDICATION Vitamin C, B complex & quercetin    . progesterone (PROMETRIUM) 100 MG capsule Take 1 capsule (100 mg total) by mouth daily. 30 capsule 11   No current facility-administered medications for this visit.    Allergies:   Review of patient's allergies indicates no known allergies.    Social History:  The patient  reports that she quit smoking about 18 years ago. Her smoking use included Cigarettes. She has never used smokeless tobacco. She reports that she drinks alcohol. She reports that she does not use illicit drugs.   Family History:  The patient's family history includes Cancer in her father; Depression in her mother; Hypertension in her father; Thyroid disease in her sister.    ROS:  Please see the history of present illness.   Otherwise, review of systems are positive for none.   All other systems are reviewed and negative.    PHYSICAL EXAM: VS:  LMP 09/01/2007 , BMI There is no weight on file to calculate BMI. Affect appropriate Healthy:  appears stated age 72: normal Neck supple with no adenopathy JVP normal no bruits no thyromegaly  Lungs clear with no wheezing and good diaphragmatic motion Heart:  S1/S2 no murmur, no rub, gallop or click PMI normal Abdomen: benighn, BS positve, no tenderness, no AAA no bruit.  No HSM or HJR Distal pulses intact with no bruits No edema Neuro non-focal Skin warm and dry No muscular weakness    EKG:  SR artifact poor R wave progression 10/12/14  Repeat today 12/20/14 no charge to patient  SR     Recent Labs: No results found for requested labs within last 365 days.    Lipid Panel No results found for: CHOL, TRIG, HDL, CHOLHDL, VLDL, LDLCALC, LDLDIRECT    Wt Readings from Last 3 Encounters:  04/27/14 155 lb 6.4 oz (70.489 kg)  03/15/13 158 lb (71.668 kg)  09/26/12 155 lb (70.308 kg)      Other studies Reviewed: Additional studies/ records that were reviewed  today include: Dr Willey Blade office notes.    ASSESSMENT AND PLAN:  1.  Abnormal ECG:  Repeat ECG today essentially normal with no artifact and better R wave progression due to better lead placement.  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  The patient does not have concerns regarding medicines.  The following changes have been made:  no change  Labs/ tests ordered today include: Echo  No orders of the defined types were placed in this encounter.     Disposition:   FU with [facility] PRN       Signed, Jenkins Rouge, MD  12/19/2014 9:46 PM    [facility] [address] [facility], [place], [place]  [address] Phone: [phone]; Fax: [phone]

## 2014-12-20 ENCOUNTER — Ambulatory Visit (INDEPENDENT_AMBULATORY_CARE_PROVIDER_SITE_OTHER): Payer: BLUE CROSS/BLUE SHIELD | Admitting: Cardiovascular Disease

## 2014-12-20 ENCOUNTER — Encounter: Payer: Self-pay | Admitting: Cardiovascular Disease

## 2014-12-20 VITALS — BP 136/80 | HR 88 | Ht 65.0 in | Wt 156.0 lb

## 2014-12-20 DIAGNOSIS — Z136 Encounter for screening for cardiovascular disorders: Secondary | ICD-10-CM | POA: Diagnosis not present

## 2014-12-20 NOTE — Patient Instructions (Signed)
Your physician recommends that you schedule a follow-up appointment in: As needed  Your physician recommends that you continue on your current medications as directed. Please refer to the Current Medication list given to you today.  Your physician has requested that you have an echocardiogram. Echocardiography is a painless test that uses sound waves to create images of your heart. It provides your doctor with information about the size and shape of your heart and how well your heart's chambers and valves are working. This procedure takes approximately one hour. There are no restrictions for this procedure.   Thank you for choosing [facility]!

## 2014-12-21 ENCOUNTER — Ambulatory Visit (HOSPITAL_COMMUNITY)
Admission: RE | Admit: 2014-12-21 | Discharge: 2014-12-21 | Disposition: A | Discharge status: Home / Self Care | Payer: BLUE CROSS/BLUE SHIELD | Source: Ambulatory Visit | Attending: Cardiovascular Disease | Admitting: Cardiovascular Disease

## 2014-12-21 DIAGNOSIS — Z136 Encounter for screening for cardiovascular disorders: Secondary | ICD-10-CM | POA: Insufficient documentation

## 2014-12-21 DIAGNOSIS — I340 Nonrheumatic mitral (valve) insufficiency: Secondary | ICD-10-CM | POA: Diagnosis not present

## 2014-12-21 NOTE — Progress Notes (Signed)
  Echocardiogram 2D Echocardiogram has been performed.  Samuel Germany 12/21/2014, 11:26 AM

## 2014-12-25 ENCOUNTER — Telehealth: Payer: Self-pay | Admitting: Cardiovascular Disease

## 2014-12-25 NOTE — Telephone Encounter (Signed)
Will forward to Dr.Nishan. 

## 2014-12-25 NOTE — Telephone Encounter (Signed)
Results of echo and needs a "fit to workBarista  / tg

## 2014-12-25 NOTE — Telephone Encounter (Signed)
Patient notified, will provide patient with this note

## 2014-12-25 NOTE — Telephone Encounter (Signed)
Ok to write note that she is clear for any job with no restriction Echo normal

## 2014-12-25 NOTE — Telephone Encounter (Signed)
PT ALREADY  AWARE  OF  RESPONSE PER  PT  Theresa [name]  HAD  CALLED .Theresa [name]

## 2015-04-08 ENCOUNTER — Other Ambulatory Visit: Payer: Self-pay | Admitting: Obstetrics and Gynecology

## 2015-04-08 NOTE — Telephone Encounter (Signed)
Medication refill request: Macrobid  Last AEX:  04-27-14 Next AEX: 05-02-15 Last MMG (if hormonal medication request): 05-01-14 WNL  Refill authorized: please advise

## 2015-04-18 ENCOUNTER — Other Ambulatory Visit: Payer: Self-pay | Admitting: Obstetrics and Gynecology

## 2015-04-18 ENCOUNTER — Other Ambulatory Visit: Payer: Self-pay | Admitting: Obstetrics & Gynecology

## 2015-04-18 NOTE — Telephone Encounter (Signed)
Medication refill request: Estradiol 0.05 mg  Last AEX:  04/27/14 with BS Next AEX: 05/12/2015 with BS  Last MMG (if hormonal medication request): 05/11/14 breast density c; bi-rads 2; benign Refill authorized: #8/1 rfs

## 2015-04-19 NOTE — Telephone Encounter (Signed)
Medication refill request: Clonazepam 0.5 mg  Last AEX:  04/27/14 with BS Next AEX: 05/02/2015 with BS Last refilled: 12/03/14 #30/1 rfs by Dr. Sabra Heck Refill authorized: #30/1 rfs?

## 2015-04-22 MED ORDER — CLONAZEPAM 0.5 MG PO TABS: 0.5000 mg | ORAL_TABLET | Freq: Every evening | ORAL | Status: DC | PRN

## 2015-04-22 NOTE — Addendum Note (Signed)
Addended by: Alfonzo Feller on: 04/22/2015 04:40 PM   Modules accepted: Orders

## 2015-04-22 NOTE — Telephone Encounter (Signed)
[facility] faxed to [facility].  

## 2015-05-02 ENCOUNTER — Other Ambulatory Visit: Payer: Self-pay | Admitting: Obstetrics and Gynecology

## 2015-05-02 ENCOUNTER — Encounter: Payer: Self-pay | Admitting: Obstetrics and Gynecology

## 2015-05-02 ENCOUNTER — Ambulatory Visit (INDEPENDENT_AMBULATORY_CARE_PROVIDER_SITE_OTHER): Payer: BLUE CROSS/BLUE SHIELD | Admitting: Obstetrics and Gynecology

## 2015-05-02 VITALS — BP 118/70 | HR 70 | Resp 16 | Ht 64.5 in | Wt 156.6 lb

## 2015-05-02 DIAGNOSIS — Z01419 Encounter for gynecological examination (general) (routine) without abnormal findings: Secondary | ICD-10-CM | POA: Diagnosis not present

## 2015-05-02 DIAGNOSIS — R300 Dysuria: Secondary | ICD-10-CM | POA: Diagnosis not present

## 2015-05-02 DIAGNOSIS — Z0000 Encounter for general adult medical examination without abnormal findings: Secondary | ICD-10-CM | POA: Diagnosis not present

## 2015-05-02 LAB — POCT URINALYSIS DIPSTICK
BILIRUBIN UA: NEGATIVE
GLUCOSE UA: NEGATIVE
KETONES UA: NEGATIVE
NITRITE UA: NEGATIVE
PH UA: 5
Protein, UA: NEGATIVE
Urobilinogen, UA: NEGATIVE

## 2015-05-02 MED ORDER — PROGESTERONE MICRONIZED 100 MG PO CAPS: 100.0000 mg | ORAL_CAPSULE | Freq: Every day | ORAL | Status: DC

## 2015-05-02 MED ORDER — ESTRADIOL 0.05 MG/24HR TD PTTW: 1.0000 | MEDICATED_PATCH | TRANSDERMAL | Status: DC

## 2015-05-02 MED ORDER — ESTROGENS, CONJUGATED 0.625 MG/GM VA CREA: TOPICAL_CREAM | VAGINAL | Status: DC

## 2015-05-02 NOTE — Patient Instructions (Signed)

## 2015-05-02 NOTE — Progress Notes (Signed)
Patient ID: Theresa [name], female   DOB: 1955/11/14, 59 y.o.   MRN: [id number] 59 y.o. G54P1001 Married Caucasian female here for annual exam.    Working part time again.  Is a [employer]arry manager.   Patient is on HRT.  Vivelle Dot and Prometrium.  Has dyspareunia and dryness of vagina.  Patient recently treated for UTI by her PCP and she is on day 7 of Bactrim.  Patient states feeling some better, but still with some lower pressure. This was a postcoital UTI.  Patient is feeling better now but still has pressure in the urethra. This occurs when she has cystitis.   Does labs with PCP.   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  Told the EKG was abnormal when it was OK  Saw Dr. Cecile Sheerer, cardiology, who did an ECHO which was normal.   PCP:  [name], MD   Patient's last menstrual period was 09/01/2007.          Sexually active: Yes.  female  The current method of family planning is tubal ligation--postmenopausal.    Exercising: Yes.    Barn work,ride horses, yoga and walking. Smoker:  former  Health Maintenance: Pap:  03-02-12 Neg:Neg HR HPV History of abnormal Pap:  no MMG:  05-11-14 Density Cat.C/benign nodule Left breast.  No significant change/BiRads2:[facility] Colonoscopy:  05/2012 polyps with Dr. Collene Mares.  Patient due 06/2015. BMD:   N/a  Result  n/a TDaP:  2010 Screening Labs:  Hb today: PCP, Urine today: 1+WBCs,Tr RBCs -- patient currently on Bactrim for UTI(day 7)   reports that she quit smoking about 19 years ago. Her smoking use included Cigarettes. She has never used smokeless tobacco. She reports that she does not drink alcohol or use illicit drugs.  Past Medical History  Diagnosis Date  . No pertinent past medical history   . Contact lens/glasses fitting     wears contacts or glasses  . Vertigo   . Diarrhea     --post gallbladder surgery    Past Surgical History  Procedure Laterality Date  . Cholecystectomy    . Bladder suspension    . Tubal ligation    . Colonoscopy     . Breast surgery      lumpectomy x2  . Breast biopsy  09/08/2012    Procedure: BREAST BIOPSY;  Surgeon: Odis Hollingshead, MD;  Location: [facility];  Service: General;  Laterality: Left;  remove left breast mass    Current Outpatient Prescriptions  Medication Sig Dispense Refill  . cholestyramine (QUESTRAN) 4 G packet     . clonazePAM (KLONOPIN) 0.5 MG tablet Take 1 tablet (0.5 mg total) by mouth at bedtime as needed. 30 tablet 0  . estradiol (VIVELLE-DOT) 0.05 MG/24HR patch PLACE 1 PATCH (0.05 MG TOTAL) ONTO THE SKIN 2 (TWO) TIMES A WEEK. 8 patch 0  . nitrofurantoin, macrocrystal-monohydrate, (MACROBID) 100 MG capsule Take 1 capsule (100 mg total) by mouth once. Take with intercourse. 30 capsule 0  . OVER THE COUNTER MEDICATION Vitamin C, B complex & quercetin    . progesterone (PROMETRIUM) 100 MG capsule Take 1 capsule (100 mg total) by mouth daily. 30 capsule 11  . sulfamethoxazole-trimethoprim (BACTRIM DS,SEPTRA DS) 800-160 MG per tablet Take 1 tablet by mouth 2 (two) times daily.  0   No current facility-administered medications for this visit.    Family History  Problem Relation Age of Onset  . Cancer Father     colon  .  Hypertension Father   . Depression Mother   . Thyroid disease Sister     ROS:  Pertinent items are noted in HPI.  Otherwise, a comprehensive ROS was negative.  Exam:   BP 118/70 mmHg  Pulse 70  Resp 16  Ht 5' 4.5" (1.638 m)  Wt 156 lb 9.6 oz (71.033 kg)  BMI 26.47 kg/m2  LMP 09/01/2007    General appearance: alert, cooperative and appears stated age Head: Normocephalic, without obvious abnormality, atraumatic Neck: no adenopathy, supple, symmetrical, trachea midline and thyroid normal to inspection and palpation Lungs: clear to auscultation bilaterally Breasts: normal appearance, no masses or tenderness, Inspection negative, No nipple retraction or dimpling, No nipple discharge or bleeding, No axillary or supraclavicular  adenopathy Heart: regular rate and rhythm Abdomen: soft, non-tender; bowel sounds normal; no masses,  no organomegaly Extremities: extremities normal, atraumatic, no cyanosis or edema Skin: Skin color, texture, turgor normal. No rashes or lesions Lymph nodes: Cervical, supraclavicular, and axillary nodes normal. No abnormal inguinal nodes palpated Neurologic: Grossly normal  Pelvic: External genitalia:  no lesions              Urethra:  normal appearing urethra with no masses, tenderness or lesions              Bartholins and Skenes: normal                 Vagina: normal appearing vagina with normal color and discharge, no lesions              Cervix: no lesions.  Sessile polyp.  Bleeds with pap.               Pap taken: Yes.   Bimanual Exam:  Uterus:  normal size, contour, position, consistency, mobility, non-tender.  Retroverted uterus.               Adnexa: normal adnexa and no mass, fullness, tenderness              Rectovaginal: Yes.  .  Confirms.              Anus:  normal sphincter tone, no lesions  Chaperone was present for exam.  Assessment:   Well woman visit with normal exam. Post coital UTIs.  Recent treatment with some continuation of symptoms. Atrophic vaginal symptoms. Small sessile polyp of cervix.  HRT patient.   Plan: Yearly mammogram recommended after age 89.  Recommended self breast exam.  Pap and HR HPV as above. Discussed Calcium, Vitamin D, regular exercise program including cardiovascular and weight bearing exercise. Labs performed.  Yes.  .   See orders.  UC sent.  Refills given on medications.  Yes.  .  See orders. Vivelle Dot and Prometrium.  Will start Premarin vaginal cream 1/2 gram pv at hs for 2 weeks and then twice per week.  See orders.   I discussed risks of DVT, PE, MI, stroke, and breast cancer.  Discussed lubricants - H20 based and oil based. Follow up annually and prn.     After visit summary provided.

## 2015-05-03 LAB — URINE CULTURE
COLONY COUNT: NO GROWTH
Organism ID, Bacteria: NO GROWTH

## 2015-05-04 ENCOUNTER — Other Ambulatory Visit: Payer: Self-pay | Admitting: Obstetrics and Gynecology

## 2015-05-07 LAB — IPS PAP TEST WITH HPV

## 2015-05-07 NOTE — Telephone Encounter (Signed)
Medication refill request: Progesterone Last AEX:  05-02-15  Next AEX: 05-15-16 Last MMG (if hormonal medication request): 05-11-14 WNL  Refill authorized: please advise

## 2015-05-08 ENCOUNTER — Telehealth: Payer: Self-pay | Admitting: Emergency Medicine

## 2015-05-08 NOTE — Telephone Encounter (Signed)
-----   Message from Nunzio Cobbs, MD sent at 05/05/2015  1:18 PM EDT ----- Please report negative urine culture to patient.   Cc- Marisa Sprinkles

## 2015-05-08 NOTE — Telephone Encounter (Signed)
Patient returned call.  She is given message from Dr. Quincy Simmonds and reports she does feel improved.   She states she has concerns about cost of Premarin cream, $185.00 for 60 grams. Patient does have coverage for Premarin cream with her [employer]. Patient is advised to request that only one 30 gram tube be dispensed from pharmacy and that should last at least 3 months. Also advised of savings card, patient will try to download online first, if cannot get card to work she will call back.   Routing to provider for final review. Patient agreeable to disposition. Will close encounter.

## 2015-05-08 NOTE — Telephone Encounter (Signed)
Message left to return call to Beronica Lansdale at [phone].    

## 2015-08-09 ENCOUNTER — Other Ambulatory Visit: Payer: Self-pay | Admitting: Obstetrics & Gynecology

## 2015-08-09 NOTE — Telephone Encounter (Signed)
Medication refill request: Klonopin 0.5 mg Last AEX:  05/02/2015 Dr. Quincy Simmonds Next AEX: 05/15/2016 Dr. Quincy Simmonds Last MMG (if hormonal medication request):05/11/2014 Refill authorized: 04/22/2015 Klonopin 0.5 mg #30 tabs 0 Refills  Today: Same?

## 2015-10-11 ENCOUNTER — Other Ambulatory Visit: Payer: Self-pay | Admitting: Obstetrics and Gynecology

## 2015-10-11 NOTE — Telephone Encounter (Signed)
Medication refill request: klonopin  Last AEX:  05/02/15 Dr. Quincy Simmonds Next AEX: 05/15/16 Dr. Quincy Simmonds  Last MMG (if hormonal medication request): 09/20/15 BIRADS1 : neg Refill authorized: 08/09/15 #30tabs/0R. Today please advise.

## 2015-10-11 NOTE — Telephone Encounter (Signed)
Faxed Rx for Clonazepam 0.5mg  #30, 1[facility].

## 2016-01-30 HISTORY — PX: BUNIONECTOMY: SHX129

## 2016-05-09 ENCOUNTER — Other Ambulatory Visit: Payer: Self-pay | Admitting: Obstetrics and Gynecology

## 2016-05-11 ENCOUNTER — Other Ambulatory Visit: Payer: Self-pay | Admitting: Obstetrics and Gynecology

## 2016-05-11 NOTE — Telephone Encounter (Signed)
Medication refill request: Estradiol (Vivelle-Dot) Last AEX:  05/02/15 BS Next AEX: [id number] BS Last MMG (if hormonal medication request): 09/20/15 3D BIRADS1, Density C, [facility] Refill authorized: 05/02/15 #8 Patch 11R. Please advise. Thank you.

## 2016-05-11 NOTE — Telephone Encounter (Signed)
Medication refill request: Progesterone (Prometrium) Last AEX:  05/02/15 BS Next AEX: 05/15/16 BS Last MMG (if hormonal medication request): 09/20/15 BIRADS1, Density C, 3D-[facility] Refill authorized: 05/07/15 #30 11R. Please advise. Thank you

## 2016-05-14 NOTE — Progress Notes (Signed)
60 y.o. G27P1001 Married Caucasian female here for annual exam.    Patient is on HRT. No hot flashes.  Wants to continue.   Not sexually active due to ED of partner.   Had foot surgery for a hammar toe. Gained weight from inactivity.   Asking for Klonipin.  Mother did not recognize her yesterday at nursing home.  This was the first time.  Has dementia.  Patient is very upset.   PCP:   Dr. Willey Blade.    Patient's last menstrual period was 09/01/2007.           Sexually active: No. - occasional The current method of family planning is tubal ligation/postmenopausal.    Exercising: Yes.    walking Smoker:  former  Health Maintenance: Pap:  05-02-15 Neg:Neg HR HPV History of abnormal Pap:  no MMG:  09-20-15 3D/Density C/Neg/BiRads1:[facility] Colonoscopy:2016;normal per patient;  [id number] polyps with Dr. Collene Mares. BMD:   n/a  Result  n/a TDaP:  2010 Gardasil:   N/A HIV:  With PCP. Hep C:  With PCP.  Screening Labs:  Hb today: PCP, Urine today: 2+ WBCs; 8.0pH - patient states that she has no urinary or vaginal symptoms   reports that she quit smoking about 20 years ago. Her smoking use included Cigarettes. She has never used smokeless tobacco. She reports that she does not drink alcohol or use drugs.  Past Medical History:  Diagnosis Date  . Contact lens/glasses fitting    wears contacts or glasses  . Diarrhea    --post gallbladder surgery  . No pertinent past medical history   . Vertigo     Past Surgical History:  Procedure Laterality Date  . BLADDER SUSPENSION    . BREAST BIOPSY  09/08/2012   Procedure: BREAST BIOPSY;  Surgeon: Odis Hollingshead, MD;  Location: [facility];  Service: General;  Laterality: Left;  remove left breast mass  . BREAST SURGERY     lumpectomy x2  . CHOLECYSTECTOMY    . COLONOSCOPY    . TUBAL LIGATION      Current Outpatient Prescriptions  Medication Sig Dispense Refill  . cholestyramine (QUESTRAN) 4 G packet     . clonazePAM (KLONOPIN)  0.5 MG tablet TAKE 1 TABLET AT BEDTIME AS NEEDED 30 tablet 1  . conjugated estrogens (PREMARIN) vaginal cream Use 1/2 g vaginally every night at bed time for the first 2 weeks, then use 1/2 g vaginally two or three times per week. 60 g 3  . estradiol (VIVELLE-DOT) 0.05 MG/24HR patch PLACE 1 PATCH (0.05 MG TOTAL) ONTO THE SKIN 2 (TWO) TIMES A WEEK. 8 patch 0  . nitrofurantoin, macrocrystal-monohydrate, (MACROBID) 100 MG capsule Take 1 capsule (100 mg total) by mouth once. Take with intercourse. 30 capsule 0  . OVER THE COUNTER MEDICATION Vitamin C, B complex & quercetin    . progesterone (PROMETRIUM) 100 MG capsule TAKE ONE CAPSULE BY MOUTH EVERY DAY 30 capsule 0  . sulfamethoxazole-trimethoprim (BACTRIM DS,SEPTRA DS) 800-160 MG per tablet Take 1 tablet by mouth 2 (two) times daily.  0   No current facility-administered medications for this visit.     Family History  Problem Relation Age of Onset  . Cancer Father     colon  . Hypertension Father   . Depression Mother   . Thyroid disease Sister     ROS:  Pertinent items are noted in HPI.  Otherwise, a comprehensive ROS was negative.  Exam:   LMP 09/01/2007  General appearance: alert, cooperative and appears stated age Head: Normocephalic, without obvious abnormality, atraumatic Neck: no adenopathy, supple, symmetrical, trachea midline and thyroid normal to inspection and palpation Lungs: clear to auscultation bilaterally Breasts: normal appearance, no masses or tenderness, No nipple retraction or dimpling, No nipple discharge or bleeding, No axillary or supraclavicular adenopathy Heart: regular rate and rhythm Abdomen: soft, non-tender; no masses, no organomegaly Extremities: extremities normal, atraumatic, no cyanosis or edema Skin: Skin color, texture, turgor normal. No rashes or lesions Lymph nodes: Cervical, supraclavicular, and axillary nodes normal. No abnormal inguinal nodes palpated Neurologic: Grossly normal  Pelvic:  External genitalia:  no lesions              Urethra:  normal appearing urethra with no masses, tenderness or lesions              Bartholins and Skenes: normal                 Vagina: normal appearing vagina with normal color and discharge, no lesions              Cervix: no lesions              Pap taken: No. Bimanual Exam:  Uterus:  normal size, contour, position, consistency, mobility, non-tender              Adnexa: no mass, fullness, tenderness              Rectal exam: Yes.  .  Confirms.              Anus:  normal sphincter tone, no lesions  Chaperone was present for exam.  Assessment:   Well woman visit with normal exam. Situational stress.  HRT.   Plan: Yearly mammogram recommended after age 77.  Recommended self breast exam.  Pap and HR HPV as above. Discussed Calcium, Vitamin D, regular exercise program including cardiovascular and weight bearing exercise. Rx for Vivelle Dot 0.05 mg twice weekly and Prometrium 100 md daily for one year.  Discussed WHI and benefits and risks of HRT.  Risks include DVT, PE, MI, stroke, and breast cancer.  Patient wishes to continue. Limited Rx for Klonopin 0.5 mg tablet at bed time prn.   We talked about medication for prevention of anxiety if she has need for ongoing Rx for Klonopin.  I am not planning to refill this chronically.  We discussed the addictive nature of benzodiazipines.  Follow up annually and prn.       After visit summary provided.

## 2016-05-15 ENCOUNTER — Ambulatory Visit (INDEPENDENT_AMBULATORY_CARE_PROVIDER_SITE_OTHER): Payer: BLUE CROSS/BLUE SHIELD | Admitting: Obstetrics and Gynecology

## 2016-05-15 ENCOUNTER — Encounter: Payer: Self-pay | Admitting: Obstetrics and Gynecology

## 2016-05-15 VITALS — BP 122/70 | HR 72 | Resp 16 | Ht 65.0 in | Wt 164.0 lb

## 2016-05-15 DIAGNOSIS — Z658 Other specified problems related to psychosocial circumstances: Secondary | ICD-10-CM

## 2016-05-15 DIAGNOSIS — Z79890 Hormone replacement therapy (postmenopausal): Secondary | ICD-10-CM

## 2016-05-15 DIAGNOSIS — Z0000 Encounter for general adult medical examination without abnormal findings: Secondary | ICD-10-CM | POA: Diagnosis not present

## 2016-05-15 DIAGNOSIS — Z01419 Encounter for gynecological examination (general) (routine) without abnormal findings: Secondary | ICD-10-CM

## 2016-05-15 DIAGNOSIS — F439 Reaction to severe stress, unspecified: Secondary | ICD-10-CM

## 2016-05-15 LAB — POCT URINALYSIS DIPSTICK
BILIRUBIN UA: NEGATIVE
GLUCOSE UA: NEGATIVE
Ketones, UA: NEGATIVE
NITRITE UA: NEGATIVE
Protein, UA: NEGATIVE
RBC UA: NEGATIVE
UROBILINOGEN UA: NEGATIVE
pH, UA: 8

## 2016-05-15 MED ORDER — CLONAZEPAM 0.5 MG PO TABS: 0.5000 mg | ORAL_TABLET | Freq: Every evening | ORAL | 0 refills | Status: DC | PRN

## 2016-05-15 MED ORDER — PROGESTERONE MICRONIZED 100 MG PO CAPS: 100.0000 mg | ORAL_CAPSULE | Freq: Every day | ORAL | 11 refills | Status: DC

## 2016-05-15 MED ORDER — ESTRADIOL 0.05 MG/24HR TD PTTW: 1.0000 | MEDICATED_PATCH | TRANSDERMAL | 11 refills | Status: DC

## 2016-05-15 NOTE — Patient Instructions (Signed)

## 2016-10-16 DIAGNOSIS — M5127 Other intervertebral disc displacement, lumbosacral region: Secondary | ICD-10-CM | POA: Diagnosis not present

## 2016-10-16 DIAGNOSIS — M25571 Pain in right ankle and joints of right foot: Secondary | ICD-10-CM | POA: Diagnosis not present

## 2016-10-16 DIAGNOSIS — M25572 Pain in left ankle and joints of left foot: Secondary | ICD-10-CM | POA: Diagnosis not present

## 2016-10-16 DIAGNOSIS — M9901 Segmental and somatic dysfunction of cervical region: Secondary | ICD-10-CM | POA: Diagnosis not present

## 2016-10-20 DIAGNOSIS — M25572 Pain in left ankle and joints of left foot: Secondary | ICD-10-CM | POA: Diagnosis not present

## 2016-10-20 DIAGNOSIS — M9901 Segmental and somatic dysfunction of cervical region: Secondary | ICD-10-CM | POA: Diagnosis not present

## 2016-10-20 DIAGNOSIS — M25571 Pain in right ankle and joints of right foot: Secondary | ICD-10-CM | POA: Diagnosis not present

## 2016-10-20 DIAGNOSIS — M5127 Other intervertebral disc displacement, lumbosacral region: Secondary | ICD-10-CM | POA: Diagnosis not present

## 2016-11-02 DIAGNOSIS — G4700 Insomnia, unspecified: Secondary | ICD-10-CM | POA: Diagnosis not present

## 2016-11-02 DIAGNOSIS — Z79899 Other long term (current) drug therapy: Secondary | ICD-10-CM | POA: Diagnosis not present

## 2016-11-10 DIAGNOSIS — Z0001 Encounter for general adult medical examination with abnormal findings: Secondary | ICD-10-CM | POA: Diagnosis not present

## 2016-11-10 DIAGNOSIS — J069 Acute upper respiratory infection, unspecified: Secondary | ICD-10-CM | POA: Diagnosis not present

## 2016-11-10 DIAGNOSIS — Z6828 Body mass index (BMI) 28.0-28.9, adult: Secondary | ICD-10-CM | POA: Diagnosis not present

## 2016-11-10 DIAGNOSIS — R197 Diarrhea, unspecified: Secondary | ICD-10-CM | POA: Diagnosis not present

## 2017-02-10 DIAGNOSIS — M79672 Pain in left foot: Secondary | ICD-10-CM | POA: Diagnosis not present

## 2017-02-10 DIAGNOSIS — M9903 Segmental and somatic dysfunction of lumbar region: Secondary | ICD-10-CM | POA: Diagnosis not present

## 2017-02-10 DIAGNOSIS — M9902 Segmental and somatic dysfunction of thoracic region: Secondary | ICD-10-CM | POA: Diagnosis not present

## 2017-02-10 DIAGNOSIS — M79671 Pain in right foot: Secondary | ICD-10-CM | POA: Diagnosis not present

## 2017-02-10 DIAGNOSIS — M9904 Segmental and somatic dysfunction of sacral region: Secondary | ICD-10-CM | POA: Diagnosis not present

## 2017-02-10 DIAGNOSIS — M722 Plantar fascial fibromatosis: Secondary | ICD-10-CM | POA: Diagnosis not present

## 2017-02-12 DIAGNOSIS — M9904 Segmental and somatic dysfunction of sacral region: Secondary | ICD-10-CM | POA: Diagnosis not present

## 2017-02-12 DIAGNOSIS — M9903 Segmental and somatic dysfunction of lumbar region: Secondary | ICD-10-CM | POA: Diagnosis not present

## 2017-02-12 DIAGNOSIS — M722 Plantar fascial fibromatosis: Secondary | ICD-10-CM | POA: Diagnosis not present

## 2017-02-12 DIAGNOSIS — M9902 Segmental and somatic dysfunction of thoracic region: Secondary | ICD-10-CM | POA: Diagnosis not present

## 2017-02-17 DIAGNOSIS — M9903 Segmental and somatic dysfunction of lumbar region: Secondary | ICD-10-CM | POA: Diagnosis not present

## 2017-02-17 DIAGNOSIS — M9904 Segmental and somatic dysfunction of sacral region: Secondary | ICD-10-CM | POA: Diagnosis not present

## 2017-02-17 DIAGNOSIS — M9902 Segmental and somatic dysfunction of thoracic region: Secondary | ICD-10-CM | POA: Diagnosis not present

## 2017-02-17 DIAGNOSIS — M722 Plantar fascial fibromatosis: Secondary | ICD-10-CM | POA: Diagnosis not present

## 2017-02-24 DIAGNOSIS — M9903 Segmental and somatic dysfunction of lumbar region: Secondary | ICD-10-CM | POA: Diagnosis not present

## 2017-02-24 DIAGNOSIS — M9902 Segmental and somatic dysfunction of thoracic region: Secondary | ICD-10-CM | POA: Diagnosis not present

## 2017-02-24 DIAGNOSIS — M722 Plantar fascial fibromatosis: Secondary | ICD-10-CM | POA: Diagnosis not present

## 2017-02-24 DIAGNOSIS — M9904 Segmental and somatic dysfunction of sacral region: Secondary | ICD-10-CM | POA: Diagnosis not present

## 2017-06-06 ENCOUNTER — Other Ambulatory Visit: Payer: Self-pay | Admitting: Obstetrics and Gynecology

## 2017-06-07 NOTE — Progress Notes (Signed)
61 y.o. G41P1001 Married Caucasian female here for annual exam.    On HRT for 5 years.  Not always good about changing her patch.  Has varicose veins.   Occasionally has brown discharge once every moon.  No red or pink. Hx fibroids.   ROS - night time urination. Drinks coffee, tea, and carbonated beverages.  Diarrhea following GB surgery.   PCP:  [name], MD    Patient's last menstrual period was 09/01/2007 (exact date).           Sexually active: No.  The current method of family planning is tubal ligation.    Exercising: Yes.    walking Smoker:  Former  Health Maintenance: Pap: 05-02-15 Neg:Neg HR HPV History of abnormal Pap:  no MMG: 06-04-17 Denstiy C/Neg/Birads1:[facility] Colonoscopy: :2016;normal per patient; [id number] polyps with Dr. Collene Mares. BMD:   n/a  Result  n/a TDaP: 2010 Gardasil:   no MEQ:ASTMHD Hep C: Unsure Screening Labs:  Hb today: PCP, Urine today: not done   reports that she quit smoking about 21 years ago. Her smoking use included Cigarettes. She has never used smokeless tobacco. She reports that she does not drink alcohol or use drugs.  Past Medical History:  Diagnosis Date  . Contact lens/glasses fitting    wears contacts or glasses  . Diarrhea    --post gallbladder surgery  . No pertinent past medical history   . Vertigo     Past Surgical History:  Procedure Laterality Date  . BLADDER SUSPENSION    . BREAST BIOPSY  09/08/2012   Procedure: BREAST BIOPSY;  Surgeon: Odis Hollingshead, MD;  Location: [facility];  Service: General;  Laterality: Left;  remove left breast mass  . BREAST SURGERY     lumpectomy x2  . BUNIONECTOMY Right 01/2016  . CHOLECYSTECTOMY    . COLONOSCOPY    . TUBAL LIGATION      Current Outpatient Prescriptions  Medication Sig Dispense Refill  . estradiol (VIVELLE-DOT) 0.05 MG/24HR patch Place 1 patch (0.05 mg total) onto the skin 2 (two) times a week. 8 patch 11  . OVER THE COUNTER MEDICATION Vitamin C, B  complex & quercetin    . progesterone (PROMETRIUM) 100 MG capsule Take 1 capsule (100 mg total) by mouth daily. 30 capsule 11  . conjugated estrogens (PREMARIN) vaginal cream Use 1/2 g vaginally every night at bed time for the first 2 weeks, then use 1/2 g vaginally two or three times per week. (Patient not taking: Reported on 06/09/2017) 60 g 3   No current facility-administered medications for this visit.     Family History  Problem Relation Age of Onset  . Cancer Father        colon  . Hypertension Father   . Depression Mother   . Dementia Mother   . Thyroid disease Sister   . Heart disease Sister     ROS:  Pertinent items are noted in HPI.  Otherwise, a comprehensive ROS was negative.  Exam:   BP 140/82 (BP Location: Right Arm, Patient Position: Sitting, Cuff Size: Normal)   Pulse 76   Resp 14   Ht 5\' 5"  (1.651 m)   Wt 165 lb 9.6 oz (75.1 kg)   LMP 09/01/2007 (Exact Date)   BMI 27.56 kg/m     General appearance: alert, cooperative and appears stated age Head: Normocephalic, without obvious abnormality, atraumatic Neck: no adenopathy, supple, symmetrical, trachea midline and thyroid normal to inspection and palpation Lungs: clear to  auscultation bilaterally Breasts: normal appearance, no masses or tenderness, No nipple retraction or dimpling, No nipple discharge or bleeding, No axillary or supraclavicular adenopathy Heart: regular rate and rhythm Abdomen: soft, non-tender; no masses, no organomegaly Extremities: extremities normal, atraumatic, no cyanosis or edema Skin: Skin color, texture, turgor normal. No rashes or lesions Lymph nodes: Cervical, supraclavicular, and axillary nodes normal. No abnormal inguinal nodes palpated Neurologic: Grossly normal  Pelvic: External genitalia:  no lesions              Urethra:  normal appearing urethra with no masses, tenderness or lesions              Bartholins and Skenes: normal                 Vagina: normal appearing vagina  with normal color and discharge, no lesions              Cervix: no lesions.  Small sessile polyp noted.               Pap taken: Yes.   Bimanual Exam:  Uterus:   6 week size retroverted uterus.               Adnexa: no mass, fullness, tenderness              Rectal exam: Yes.  .  Confirms.              Anus:  normal sphincter tone, no lesions  Chaperone was present for exam.  Assessment:   Well woman visit with normal exam. HRT.  Sessile cervical polyp. Postmenopausal bleeding.  Hx fibroids. Urinary frequency.   Plan: Mammogram screening discussed. Recommended self breast awareness. Pap and HR HPV as above. Guidelines for Calcium, Vitamin D, regular exercise program including cardiovascular and weight bearing exercise. Will wean off HRT.  Will reduce to Minivelle 0.0375 mg twice weekly and Prometrium 100 mg daily for 3 moths and then stop. Discussed WHI and increased risks of stroke, MI, DVT, PE, and breast cancer. Labs with PCP.  Reduce bladder irritants. Follow up annually and prn.   Additional counseling given.  Yes.  . __10_____ minutes face to face time of which over 50% was spent in counseling regarding postmenopausal bleeding.  We reviewed etiologies including HRT, polyps, infection and cancer.  I discussed sonohysterogram and endometrial biopsy for which she will return to complete.  After visit summary provided.

## 2017-06-07 NOTE — Telephone Encounter (Signed)
Medication refill request: Vivelle dot  Last AEX:  05/15/16 Dr. Quincy Simmonds  Next AEX: 06/09/17 Dr. Quincy Simmonds  Last MMG (if hormonal medication request): 09/20/15 BIRADS1:neg  Refill authorized: 05/18/16 #8patch/11R. Today please advise.

## 2017-06-08 ENCOUNTER — Other Ambulatory Visit: Payer: Self-pay | Admitting: Obstetrics and Gynecology

## 2017-06-08 NOTE — Telephone Encounter (Signed)
Medication refill request: progesterone  Last AEX:  05/15/16 Dr. Quincy Simmonds  Next AEX: tomorrow  Last MMG (if hormonal medication request): 09/20/15  Refill authorized: 05/18/16 #8patch/11R.  Called patient states she has enough refills to last until her appt tomorrow. Also, she had MMG done last Friday. [facility], they will fax report.

## 2017-06-09 ENCOUNTER — Encounter: Payer: Self-pay | Admitting: Obstetrics and Gynecology

## 2017-06-09 ENCOUNTER — Ambulatory Visit (INDEPENDENT_AMBULATORY_CARE_PROVIDER_SITE_OTHER): Payer: Commercial Managed Care - PPO | Admitting: Obstetrics and Gynecology

## 2017-06-09 ENCOUNTER — Other Ambulatory Visit (HOSPITAL_COMMUNITY)
Admission: RE | Admit: 2017-06-09 | Discharge: 2017-06-09 | Disposition: A | Discharge status: Home / Self Care | Payer: Commercial Managed Care - PPO | Source: Ambulatory Visit | Attending: Obstetrics and Gynecology | Admitting: Obstetrics and Gynecology

## 2017-06-09 VITALS — BP 140/82 | HR 76 | Resp 14 | Ht 65.0 in | Wt 165.6 lb

## 2017-06-09 DIAGNOSIS — Z01419 Encounter for gynecological examination (general) (routine) without abnormal findings: Secondary | ICD-10-CM | POA: Diagnosis not present

## 2017-06-09 DIAGNOSIS — N950 Postmenopausal bleeding: Secondary | ICD-10-CM | POA: Diagnosis not present

## 2017-06-09 MED ORDER — PROGESTERONE MICRONIZED 100 MG PO CAPS: 100.0000 mg | ORAL_CAPSULE | Freq: Every day | ORAL | 2 refills | Status: DC

## 2017-06-09 MED ORDER — ESTRADIOL 0.0375 MG/24HR TD PTTW: 1.0000 | MEDICATED_PATCH | TRANSDERMAL | 2 refills | Status: DC

## 2017-06-09 NOTE — Patient Instructions (Addendum)
EXERCISE AND DIET:  We recommended that you start or continue a regular exercise program for good health. Regular exercise means any activity that makes your heart beat faster and makes you sweat.  We recommend exercising at least 30 minutes per day at least 3 days a week, preferably 4 or 5.  We also recommend a diet low in fat and sugar.  Inactivity, poor dietary choices and obesity can cause diabetes, heart attack, stroke, and kidney damage, among others.    ALCOHOL AND SMOKING:  Women should limit their alcohol intake to no more than 7 drinks/beers/glasses of wine (combined, not each!) per week. Moderation of alcohol intake to this level decreases your risk of breast cancer and liver damage. And of course, no recreational drugs are part of a healthy lifestyle.  And absolutely no smoking or even second hand smoke. Most people know smoking can cause heart and lung diseases, but did you know it also contributes to weakening of your bones? Aging of your skin?  Yellowing of your teeth and nails?  CALCIUM AND VITAMIN D:  Adequate intake of calcium and Vitamin D are recommended.  The recommendations for exact amounts of these supplements seem to change often, but generally speaking 600 mg of calcium (either carbonate or citrate) and 800 units of Vitamin D per day seems prudent. Certain women may benefit from higher intake of Vitamin D.  If you are among these women, your doctor will have told you during your visit.    PAP SMEARS:  Pap smears, to check for cervical cancer or precancers,  have traditionally been done yearly, although recent scientific advances have shown that most women can have pap smears less often.  However, every woman still should have a physical exam from her gynecologist every year. It will include a breast check, inspection of the vulva and vagina to check for abnormal growths or skin changes, a visual exam of the cervix, and then an exam to evaluate the size and shape of the uterus and  ovaries.  And after 60 years of age, a rectal exam is indicated to check for rectal cancers. We will also provide age appropriate advice regarding health maintenance, like when you should have certain vaccines, screening for sexually transmitted diseases, bone density testing, colonoscopy, mammograms, etc.   MAMMOGRAMS:  All women over 40 years old should have a yearly mammogram. Many facilities now offer a "3D" mammogram, which may cost around $50 extra out of pocket. If possible,  we recommend you accept the option to have the 3D mammogram performed.  It both reduces the number of women who will be called back for extra views which then turn out to be normal, and it is better than the routine mammogram at detecting truly abnormal areas.    COLONOSCOPY:  Colonoscopy to screen for colon cancer is recommended for all women at age 50.  We know, you hate the idea of the prep.  We agree, BUT, having colon cancer and not knowing it is worse!!  Colon cancer so often starts as a polyp that can be seen and removed at colonscopy, which can quite literally save your life!  And if your first colonoscopy is normal and you have no family history of colon cancer, most women don't have to have it again for 10 years.  Once every ten years, you can do something that may end up saving your life, right?  We will be happy to help you get it scheduled when you are ready.    Be sure to check your insurance coverage so you understand how much it will cost.  It may be covered as a preventative service at no cost, but you should check your particular policy.     Postmenopausal Bleeding Postmenopausal bleeding is any bleeding a woman has after she has entered into menopause. Menopause is the end of a woman's fertile years. After menopause, a woman no longer ovulates or has menstrual periods. Postmenopausal bleeding can be caused by various things. Any type of postmenopausal bleeding, even if it appears to be a typical menstrual  period, is concerning. This should be evaluated by your health care provider. Any treatment will depend on the cause of the bleeding. Follow these instructions at home: Monitor your condition for any changes. The following actions may help to alleviate any discomfort you are experiencing:  Avoid the use of tampons and douches as directed by your health care provider.  Change your pads frequently.  Get regular pelvic exams and Pap tests.  Keep all follow-up appointments for diagnostic tests as directed by your health care provider.  Contact a health care provider if:  Your bleeding lasts more than 1 week.  You have abdominal pain.  You have bleeding with sexual intercourse. Get help right away if:  You have a fever, chills, headache, dizziness, muscle aches, and bleeding.  You have severe pain with bleeding.  You are passing blood clots.  You have bleeding and need more than 1 pad an hour.  You feel faint. This information is not intended to replace advice given to you by your health care provider. Make sure you discuss any questions you have with your health care provider. Document Released: 11/25/2005 Document Revised: 01/23/2016 Document Reviewed: 03/16/2013 Elsevier Interactive Patient Education  [employer].

## 2017-06-14 LAB — CYTOLOGY - PAP
Diagnosis: NEGATIVE
HPV (WINDOPATH): NOT DETECTED

## 2017-06-15 ENCOUNTER — Encounter: Payer: Self-pay | Admitting: Obstetrics and Gynecology

## 2017-06-21 ENCOUNTER — Telehealth: Payer: Self-pay | Admitting: Obstetrics and Gynecology

## 2017-06-21 NOTE — Telephone Encounter (Signed)
Patient cancelled ultrasound for 12/28/2022. Her mother passed away this morning and she will call to reschedule.

## 2017-06-22 NOTE — Telephone Encounter (Signed)
Please keep patient in Imaging Hold.  She needs an ultrasound for postmenopausal bleeding.  We will need to contact her back in 2 weeks to get her scheduled.  I am so sorry to hear of her mother's passing.   Cc- Lamont Snowball, Para March

## 2017-06-22 NOTE — Telephone Encounter (Signed)
Will defer for two weeks and call patient if she has not called.  CC: Theresa [name]. Please defer order in work que for two weeks.  Encounter closed.

## 2017-06-24 ENCOUNTER — Other Ambulatory Visit: Payer: Commercial Managed Care - PPO | Admitting: Obstetrics and Gynecology

## 2017-06-24 ENCOUNTER — Other Ambulatory Visit: Payer: Commercial Managed Care - PPO

## 2017-07-07 ENCOUNTER — Telehealth: Payer: Self-pay | Admitting: Obstetrics and Gynecology

## 2017-07-07 NOTE — Telephone Encounter (Signed)
Call placed to patient to reschedule sonohysterogram and endometrial biopsy, that was cancelled due to the patients mother passing. Patient advised she is at work and will need to call back to our office to schedule.   cc: Dr Quincy Simmonds  cc: Lamont Snowball, RN

## 2017-07-13 NOTE — Telephone Encounter (Signed)
Call placed to patient to follow up on rescheduling recommended sonohysterogram and endometrial biopsy. Patient scheduled 08/05/17 with Dr Quincy Simmonds. Patient had questions regarding the procedures and past lab results. Call routed to  Reesa Chew, RN to address questions.  Routing to [employer], RN  cc: Dr Quincy Simmonds

## 2017-07-13 NOTE — Telephone Encounter (Signed)
Please reach out to patient regarding recommended evaluation for postmenopausal bleeding on HRT.  I would at recommend starting at least with a pelvic ultrasound.  She cancelled her appointment due to her mother's death.  I am so sorry for her loss.  I do want to be sure she is getting her proper GYN care.   Cc- Lerry Liner

## 2017-07-13 NOTE — Telephone Encounter (Signed)
Spoke with patient. All questions answered. Appointment for [facility] and EMB scheduled for 08/05/2017 at 9:30 am with 10 am consult with Dr.Silva. Patient is agreeable to date and time. Advised to take 800 mg of Ibuprofen 1 hour before her appointment to reduce cramping with EMB. Patient is agreeable.  Routing to provider for final review. Patient agreeable to disposition. Will close encounter.

## 2017-08-05 ENCOUNTER — Other Ambulatory Visit: Payer: Self-pay

## 2017-08-05 ENCOUNTER — Other Ambulatory Visit: Payer: Self-pay | Admitting: Obstetrics and Gynecology

## 2017-08-05 ENCOUNTER — Ambulatory Visit (INDEPENDENT_AMBULATORY_CARE_PROVIDER_SITE_OTHER): Payer: Commercial Managed Care - PPO | Admitting: Obstetrics and Gynecology

## 2017-08-05 ENCOUNTER — Encounter: Payer: Self-pay | Admitting: Obstetrics and Gynecology

## 2017-08-05 ENCOUNTER — Ambulatory Visit: Payer: Commercial Managed Care - PPO

## 2017-08-05 VITALS — BP 122/66 | HR 70 | Ht 65.0 in | Wt 163.0 lb

## 2017-08-05 DIAGNOSIS — N950 Postmenopausal bleeding: Secondary | ICD-10-CM

## 2017-08-05 DIAGNOSIS — Z79890 Hormone replacement therapy (postmenopausal): Secondary | ICD-10-CM

## 2017-08-05 DIAGNOSIS — N760 Acute vaginitis: Secondary | ICD-10-CM

## 2017-08-05 DIAGNOSIS — D219 Benign neoplasm of connective and other soft tissue, unspecified: Secondary | ICD-10-CM | POA: Diagnosis not present

## 2017-08-05 IMAGING — US US SONOHYSTEROGRAM
1 series · 14 of 19 positions shown · non-contrast
Comparison: none

[Series 1: us sonohysterogram · 0.32mm/px · 14 of 19 slices shown]
[im 1/19]
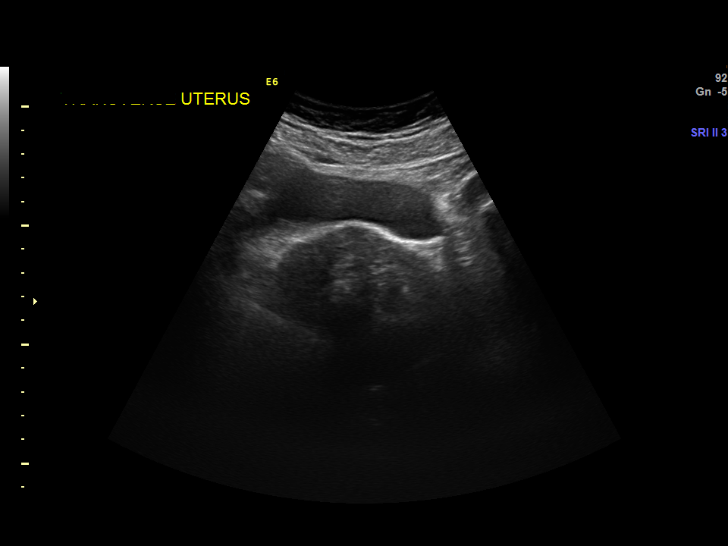
[im 3/19]
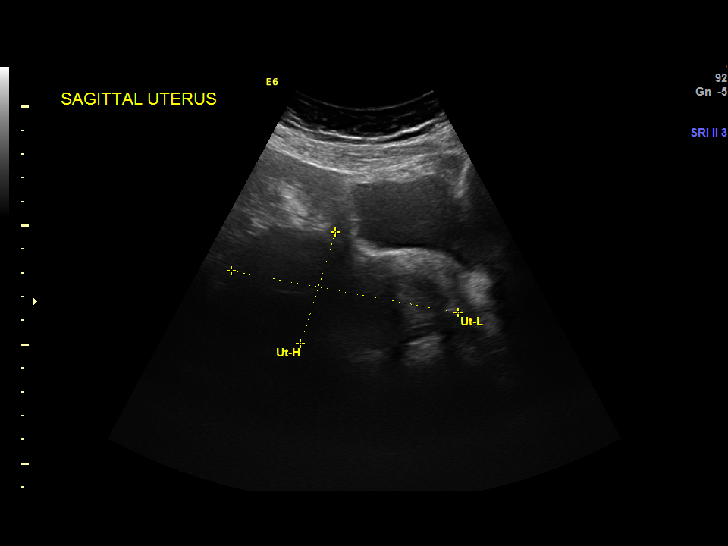
[im 4/19]
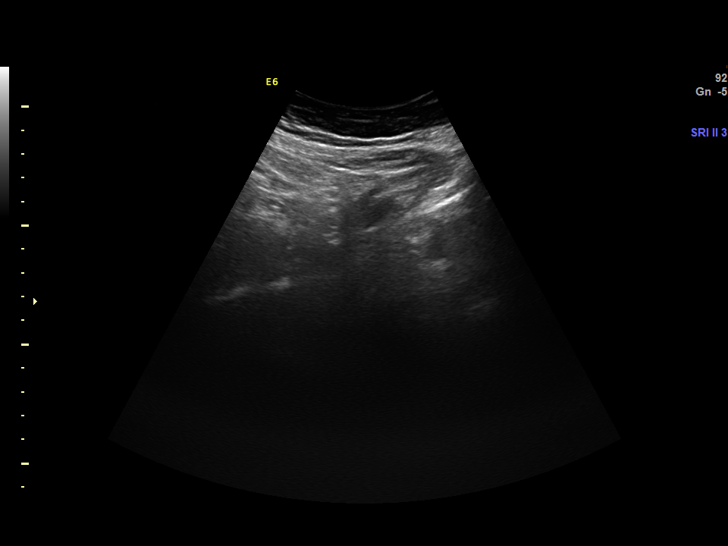
[im 5/19]
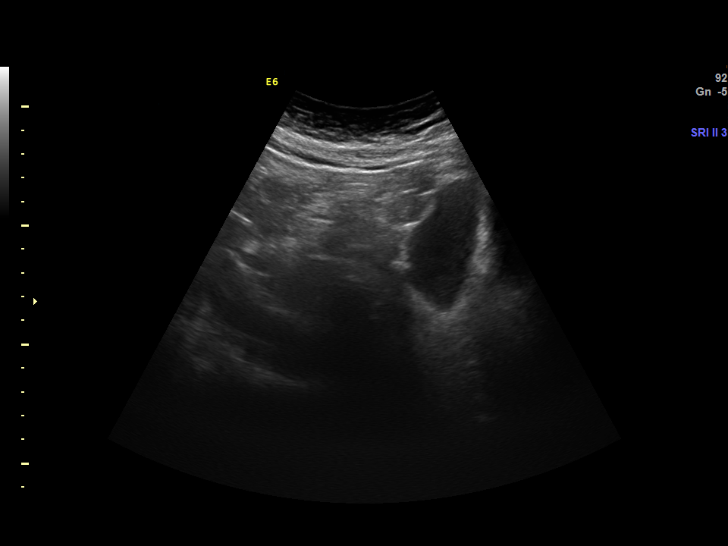
[im 7/19]
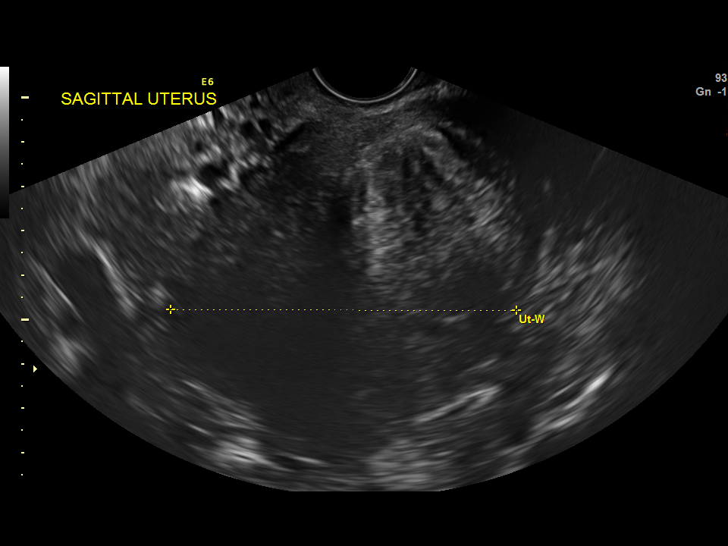
[im 8/19]
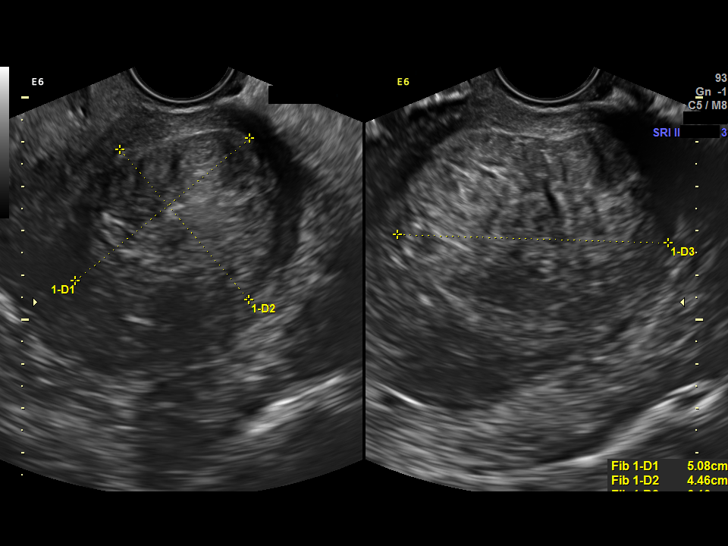
[im 9/19]
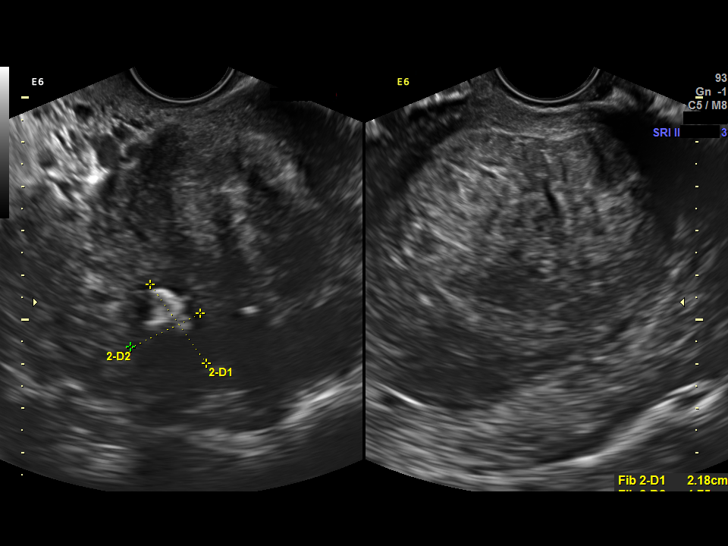
[im 11/19]
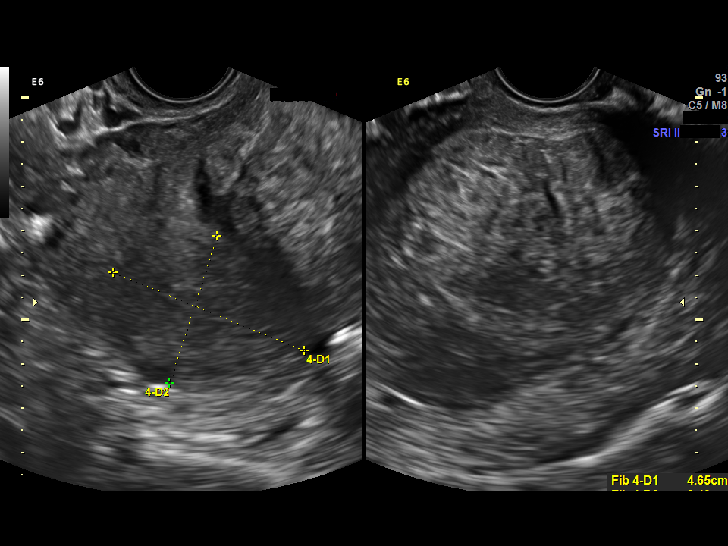
[im 12/19]
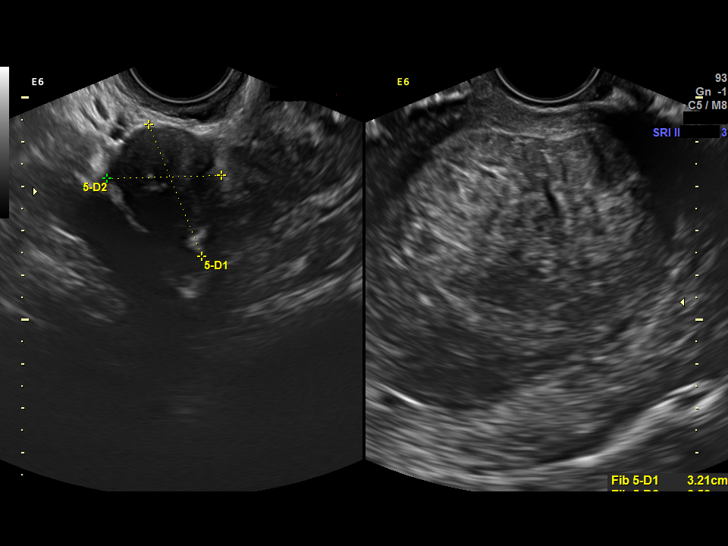
[im 13/19]
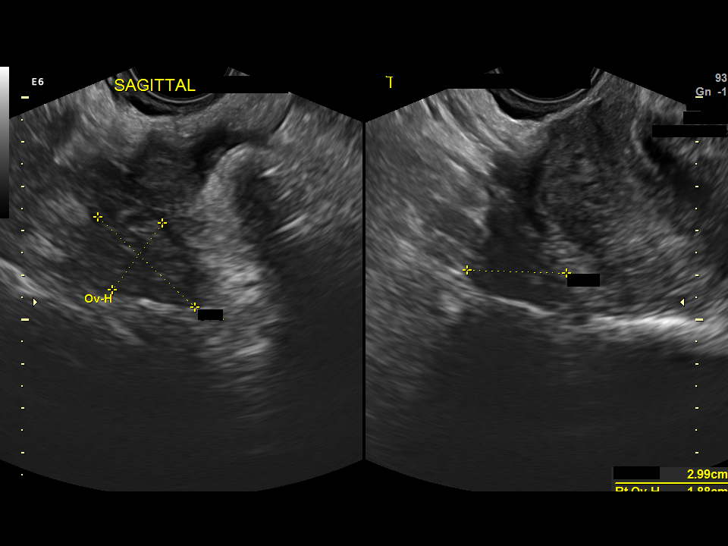
[im 15/19]
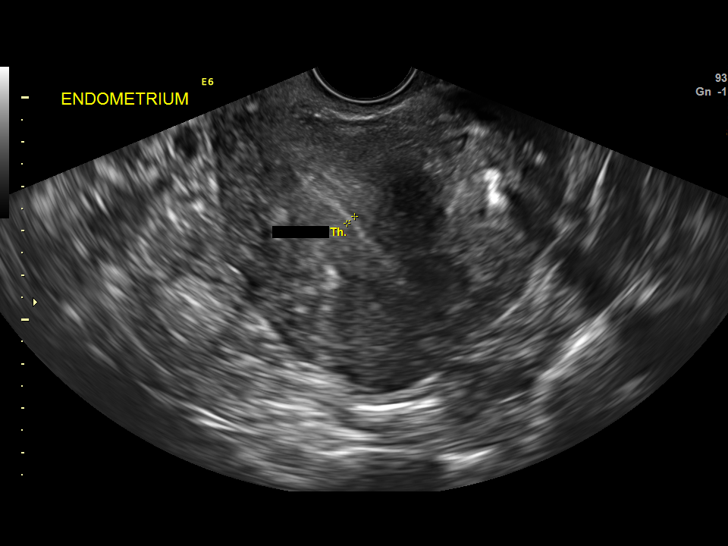
[im 16/19]
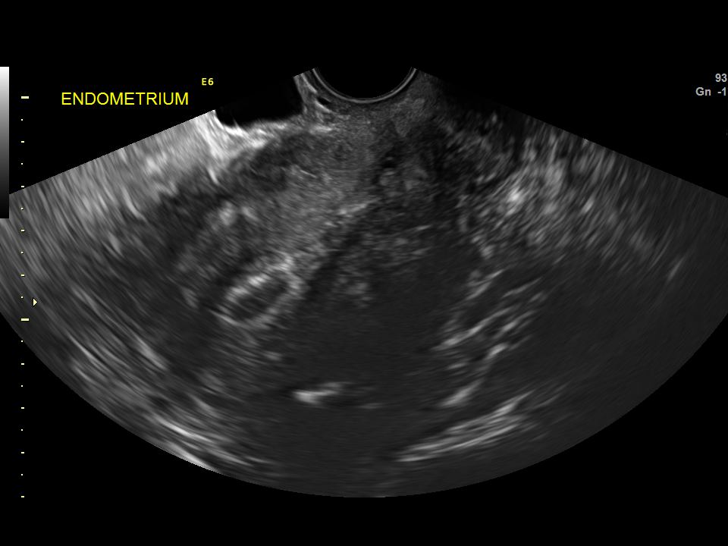
[im 17/19]
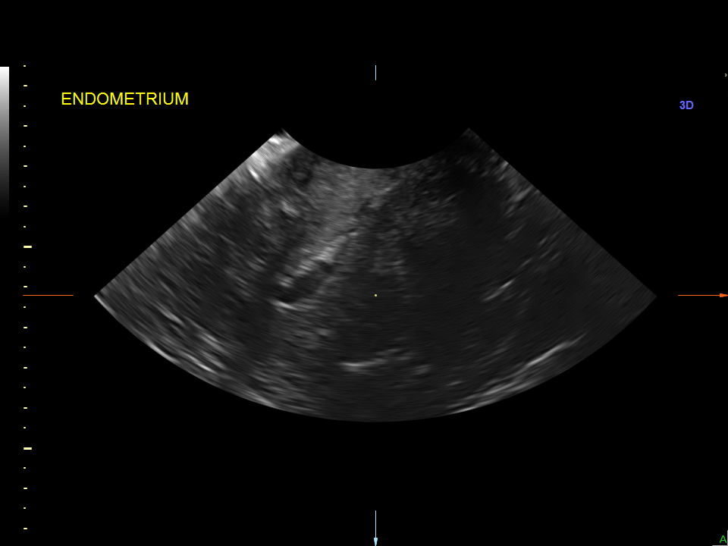
[im 19/19]
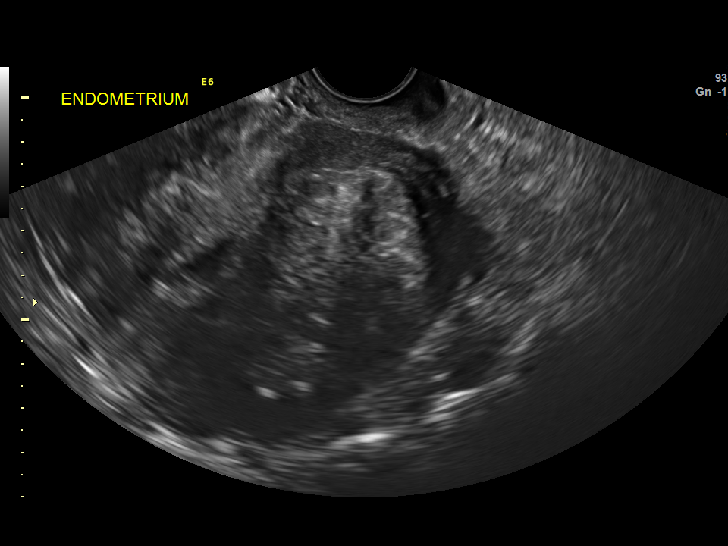

[14 of 19 positions shown; findings below may reference images not displayed]

Canned report from images found in remote index.

Refer to host system for actual result text.

## 2017-08-05 MED ORDER — FLUCONAZOLE 150 MG PO TABS: 150.0000 mg | ORAL_TABLET | Freq: Once | ORAL | 0 refills | Status: AC

## 2017-08-05 NOTE — Progress Notes (Signed)
Patient ID: Theresa [name], female   DOB: [birth year], 61 y.o.   MRN: [id number] GYNECOLOGY  VISIT   HPI: 61 y.o.   Married  Caucasian  female   G1P1001 with Patient's last menstrual period was 09/01/2007 (exact date).   here for pelvic ultrasound for postmenopausal bleeding.    Does forget to change her estrogen patch.  Experiences postmenopausal bleeding "once in a blue moon."  Reducing HRT dosage.  Some vulvar itching and vaginal discharge.   GYNECOLOGIC HISTORY: Patient's last menstrual period was 09/01/2007 (exact date). Contraception: Tubal Menopausal hormone therapy: Minivelle and Premarin cream, Prometrium.  Last mammogram:  06-04-17 Denstiy C/Neg/Birads1:[facility] Last pap smear:  06-09-17 Neg:Neg HR HPV, 05-02-15 Neg:Neg HR HPV        OB History    Gravida Para Term Preterm AB Living   1 1 1     1    SAB TAB Ectopic Multiple Live Births                     Patient Active Problem List   Diagnosis Date Noted  . Breast mass in female-left at 12:00-benign 08/29/2012    Past Medical History:  Diagnosis Date  . Contact lens/glasses fitting    wears contacts or glasses  . Diarrhea    --post gallbladder surgery  . No pertinent past medical history   . Vertigo     Past Surgical History:  Procedure Laterality Date  . BLADDER SUSPENSION    . BREAST BIOPSY  09/08/2012   Procedure: BREAST BIOPSY;  Surgeon: Odis Hollingshead, MD;  Location: [facility];  Service: General;  Laterality: Left;  remove left breast mass  . BREAST SURGERY     lumpectomy x2  . BUNIONECTOMY Right 01/2016  . CHOLECYSTECTOMY    . COLONOSCOPY    . TUBAL LIGATION      Current Outpatient Medications  Medication Sig Dispense Refill  . conjugated estrogens (PREMARIN) vaginal cream Use 1/2 g vaginally every night at bed time for the first 2 weeks, then use 1/2 g vaginally two or three times per week. 60 g 3  . estradiol (MINIVELLE) 0.0375 MG/24HR Place 1 patch onto the skin 2 (two) times a  week. Apply anywhere on lower abdomen. 8 patch 2  . OVER THE COUNTER MEDICATION Vitamin C, B complex & quercetin    . progesterone (PROMETRIUM) 100 MG capsule Take 1 capsule (100 mg total) by mouth daily. 30 capsule 2  . diclofenac (VOLTAREN) 75 MG EC tablet Take 1 tablet by mouth daily.  0  . fluconazole (DIFLUCAN) 150 MG tablet Take 1 tablet (150 mg total) by mouth once for 1 dose. Take one tablet.  Repeat in 72 hours if symptoms are not completely resolved. 2 tablet 0  . methylPREDNISolone (MEDROL DOSEPAK) 4 MG TBPK tablet See admin instructions.  0   No current facility-administered medications for this visit.      ALLERGIES: Patient has no known allergies.  Family History  Problem Relation Age of Onset  . Cancer Father        colon  . Hypertension Father   . Depression Mother   . Dementia Mother   . Thyroid disease Sister   . Heart disease Sister     Social History   Socioeconomic History  . Marital status: Married    Spouse name: Not on file  . Number of children: Not on file  . Years of education: Not on file  . Highest  education level: Not on file  Social Needs  . Financial resource strain: Not on file  . Food insecurity - worry: Not on file  . Food insecurity - inability: Not on file  . Transportation needs - medical: Not on file  . Transportation needs - non-medical: Not on file  Occupational History  . Not on file  Tobacco Use  . Smoking status: Former Smoker    Types: Cigarettes    Last attempt to quit: 03/15/1996    Years since quitting: 21.4  . Smokeless tobacco: Never Used  Substance and Sexual Activity  . Alcohol use: No    Alcohol/week: 0.0 oz  . Drug use: No  . Sexual activity: Not Currently    Partners: Male    Birth control/protection: Post-menopausal, Surgical    Comment: BTL  Other Topics Concern  . Not on file  Social History Narrative  . Not on file    ROS:  Pertinent items are noted in HPI.  PHYSICAL EXAMINATION:    BP 122/66 (BP  Location: Right Arm, Patient Position: Sitting, Cuff Size: Normal)   Pulse 70   Ht 5\' 5"  (1.651 m)   Wt 163 lb (73.9 kg)   LMP 09/01/2007 (Exact Date)   BMI 27.12 kg/m     General appearance: alert, cooperative and appears stated age   Pelvic US: 5 fibroids - 2 - 5.2 cm.  Large fibroid is adjacent to endometrium but not encroaching.  EMS 2.3 mm. Normal ovaries.  No free fluid.  Sonohysterogram/EMB: Consent for procedures.  Mild erythema of the vulva. Clumpy white discharge noted in the vagina. Sterile prep with Hibiclens.  Cannula passed with the assistance of ring forceps and tenaculum on anterior cervical lip. Sterile NS injected.  No filling defects.  Reprep with Hibiclens.  Pipelle passed to 8 cm x 2.  Tissue to pathology.  Minimal EBL.  No complications.   Chaperone was present for exam.  ASSESSMENT  Fibroids.  Postmenopausal bleeding.   Vulvovaginitis.   PLAN  Discussed fibroids and natural life history.  Follow up EMB results.  Instructions and precautions given.  OK to continue on HRT.  She is working to change her transdermal estrogen in a regular manner. Diflucan 150 ng po x 1.  May repeat in 72 hours.   An After Visit Summary was printed and given to the patient.  __15____ minutes face to face time of which over 50% was spent in counseling.

## 2017-08-05 NOTE — Progress Notes (Signed)
Encounter reviewed by Dr. Adrijana Haros Amundson C. Silva.  

## 2017-08-05 NOTE — Patient Instructions (Signed)

## 2017-08-06 DIAGNOSIS — D219 Benign neoplasm of connective and other soft tissue, unspecified: Secondary | ICD-10-CM | POA: Insufficient documentation

## 2017-09-07 ENCOUNTER — Other Ambulatory Visit: Payer: Self-pay | Admitting: Obstetrics and Gynecology

## 2017-09-07 NOTE — Telephone Encounter (Signed)
Message left to return call to Aliahna Statzer at [phone].    

## 2017-09-07 NOTE — Telephone Encounter (Signed)
Patient returned call. Message given to patient as seen below from Dr. Talbert Nan. Patient states after her discussion with Dr. Quincy Simmonds at last OV, patient plans to stop all HRT. States she has been taking the prometrium daily.   Routing to provider for final review. Patient agreeable to disposition. Will close encounter.

## 2017-09-07 NOTE — Telephone Encounter (Signed)
At the patient's annual exam she and Dr Quincy Simmonds discussed her coming off HRT. She had a negative evaluation in 12/18 for PMP bleeding. At her appointment in 12/18 it sounded like she was going to HRT. Please check on the patient's plans, if she is planning to continue the hrt until her next annual with Dr Quincy Simmonds, then please send in a refill to get her through to that appointment (check if she wants 1 or 3 months at a time). She should theoretically be due for a new prometrium script as well, please make sure she is taking the prometrium daily. Thanks

## 2017-09-07 NOTE — Telephone Encounter (Signed)
Medication refill request: minivelle  Last AEX:  06/09/17 BS  Next AEX: 06/17/18  Last MMG (if hormonal medication request): 06/04/17 BIRADS 1 negative  Refill authorized: 06/10/17 #8, 2 RF. Today, please advise.   Routing to covering provider.

## 2017-09-09 ENCOUNTER — Other Ambulatory Visit: Payer: Self-pay | Admitting: Obstetrics and Gynecology

## 2017-09-16 ENCOUNTER — Other Ambulatory Visit: Payer: Self-pay | Admitting: Obstetrics and Gynecology

## 2017-09-16 MED ORDER — ESTRADIOL 0.0375 MG/24HR TD PTTW: 1.0000 | MEDICATED_PATCH | TRANSDERMAL | 0 refills | Status: DC

## 2017-09-16 MED ORDER — PROGESTERONE MICRONIZED 100 MG PO CAPS: 100.0000 mg | ORAL_CAPSULE | Freq: Every day | ORAL | 0 refills | Status: DC

## 2017-09-16 NOTE — Telephone Encounter (Signed)
Patient is asking for 1 refill of her hormone prescription. Confirmed pharmacy on file.

## 2017-09-16 NOTE — Telephone Encounter (Signed)
Patient requesting refill on Vivelle Dot and Prometrium.  Spoke with patient and she states Dr.Silva had wanted her to try to go off HRT. Patient states she is going on vacation to [place] and would like 1 more refill and then will go off of medications. She is requesting refill on Vivelle Dot 0.0375mg  and Progesterone 100mg . Routed to [facility].  Last AEX 06-09-17 Next AEX 06-17-18 Last MMG 06-04-17 Neg, BiRads1

## 2017-10-12 ENCOUNTER — Other Ambulatory Visit: Payer: Self-pay | Admitting: Obstetrics and Gynecology

## 2017-11-09 ENCOUNTER — Telehealth: Payer: Self-pay | Admitting: Obstetrics and Gynecology

## 2017-11-09 NOTE — Telephone Encounter (Signed)
Left message to call Kaitlyn at [phone]. 

## 2017-11-09 NOTE — Telephone Encounter (Signed)
Patient called requesting to speak with the nurse. She said she is not doing HRT anymore and she is experiencing some symptoms she has questions about.

## 2017-11-12 NOTE — Telephone Encounter (Signed)
Patient returned call to Kaitlyn. °

## 2017-11-12 NOTE — Telephone Encounter (Signed)
Return call to patient. Left message to ask for triage nurse.

## 2017-11-12 NOTE — Telephone Encounter (Signed)
Left message to call Vonda Harth at [phone]. 

## 2017-11-15 NOTE — Telephone Encounter (Signed)
Spoke with patient. Patient states that since stopping HRT she has been having increase hot flashes, night sweats, vaginal irritation and itching. Vaginal irritation and itching are external. Used Monistat 1 with relief for 1 week. Reports symptoms returned 11/12/2017. Used Premarin cream 1/2 gram vaginally Friday, Saturday, and Sunday. Patient is concerned with ongoing symptoms. Patient is available for an appointment today before 2:30 pm and Wednesday morning. Advised will review with Dr.Silva and return call.  Dr.Silva, please review schedule as there are no appointment openings.

## 2017-11-16 NOTE — Telephone Encounter (Signed)
Spoke with patient. Offered appointment for 3/20, 3/21, and 3/22. Appointment scheduled for 3/21 at 10 am with Dr.Silva. Patient is agreeable to date and time. Will close encounter.

## 2017-11-16 NOTE — Telephone Encounter (Signed)
How at 12:00 on 11/19/17 for an appointment with me?

## 2017-11-18 ENCOUNTER — Encounter: Payer: Self-pay | Admitting: Obstetrics and Gynecology

## 2017-11-18 ENCOUNTER — Other Ambulatory Visit: Payer: Self-pay

## 2017-11-18 ENCOUNTER — Ambulatory Visit (INDEPENDENT_AMBULATORY_CARE_PROVIDER_SITE_OTHER): Payer: Commercial Managed Care - PPO | Admitting: Obstetrics and Gynecology

## 2017-11-18 VITALS — BP 118/70 | HR 80 | Resp 16 | Ht 65.0 in | Wt 167.0 lb

## 2017-11-18 DIAGNOSIS — N760 Acute vaginitis: Secondary | ICD-10-CM

## 2017-11-18 MED ORDER — ESTROGENS, CONJUGATED 0.625 MG/GM VA CREA: TOPICAL_CREAM | VAGINAL | 2 refills | Status: DC

## 2017-11-18 MED ORDER — TRIAMCINOLONE ACETONIDE 0.025 % EX OINT: 1.0000 "application " | TOPICAL_OINTMENT | Freq: Two times a day (BID) | CUTANEOUS | 0 refills | Status: DC

## 2017-11-18 MED ORDER — NYSTATIN 100000 UNIT/GM EX OINT: 1.0000 "application " | TOPICAL_OINTMENT | Freq: Two times a day (BID) | CUTANEOUS | 0 refills | Status: DC

## 2017-11-18 NOTE — Patient Instructions (Signed)

## 2017-11-18 NOTE — Progress Notes (Signed)
GYNECOLOGY  VISIT   HPI: 62 y.o.   Married  Caucasian  female   G1P1001 with Patient's last menstrual period was 09/01/2007 (exact date).   here for vaginal irritation since November 01, 2017; patient has used Monistat cream as well as Premarin Cream since March 15. Patient states that premarin has helped.  Stopped HRT end of Jan/beginning of Feb.  Hot flashes and night sweats returned.   Did have intercourse and developed vulvar irritation 2 -3 weeks later.  No problem with intercourse. Some burning and itching.   Monistat helped.   Outside vulvar area is raw again.   Now using Premarin cream on vulva 1/2 gram every night for 2 weeks. Has completed only one month.  Symptoms are 85 - 90% better.   Wearing a pad constantly.   Working part time now.    GYNECOLOGIC HISTORY: Patient's last menstrual period was 09/01/2007 (exact date). Contraception:  Tuabl ligation Menopausal hormone therapy:  Minivelle and premarin, Prometrium Last mammogram:  10-5-18Den[facility] C/Neg/Birads1:[facility] Last pap smear:   06-09-17 Neg:Neg HR HPV, 05-02-15 Neg:Neg HR HPV         OB History    Gravida  1   Para  1   Term  1   Preterm      AB      Living  1     SAB      TAB      Ectopic      Multiple      Live Births                 Patient Active Problem List   Diagnosis Date Noted  . Fibroids 08/06/2017  . Breast mass in female-left at 12:00-benign 08/29/2012    Past Medical History:  Diagnosis Date  . Contact lens/glasses fitting    wears contacts or glasses  . Diarrhea    --post gallbladder surgery  . No pertinent past medical history   . Vertigo     Past Surgical History:  Procedure Laterality Date  . BLADDER SUSPENSION    . BREAST BIOPSY  09/08/2012   Procedure: BREAST BIOPSY;  Surgeon: Odis Hollingshead, MD;  Location: [facility];  Service: General;  Laterality: Left;  remove left breast mass  . BREAST SURGERY     lumpectomy x2  . BUNIONECTOMY  Right 01/2016  . CHOLECYSTECTOMY    . COLONOSCOPY    . TUBAL LIGATION      Current Outpatient Medications  Medication Sig Dispense Refill  . conjugated estrogens (PREMARIN) vaginal cream Use 1/2 g vaginally every night at bed time for the first 2 weeks, then use 1/2 g vaginally two or three times per week. 60 g 3  . OVER THE COUNTER MEDICATION Vitamin C, B complex & quercetin     No current facility-administered medications for this visit.      ALLERGIES: Patient has no known allergies.  Family History  Problem Relation Age of Onset  . Cancer Father        colon  . Hypertension Father   . Depression Mother   . Dementia Mother   . Thyroid disease Sister   . Heart disease Sister     Social History   Socioeconomic History  . Marital status: Married    Spouse name: Not on file  . Number of children: Not on file  . Years of education: Not on file  . Highest education level: Not on file  Occupational History  .  Not on file  Social Needs  . Financial resource strain: Not on file  . Food insecurity:    Worry: Not on file    Inability: Not on file  . Transportation needs:    Medical: Not on file    Non-medical: Not on file  Tobacco Use  . Smoking status: Former Smoker    Types: Cigarettes    Last attempt to quit: 03/15/1996    Years since quitting: 21.6  . Smokeless tobacco: Never Used  Substance and Sexual Activity  . Alcohol use: No    Alcohol/week: 0.0 oz  . Drug use: No  . Sexual activity: Yes    Partners: Male    Birth control/protection: Post-menopausal, Surgical    Comment: BTL  Lifestyle  . Physical activity:    Days per week: Not on file    Minutes per session: Not on file  . Stress: Not on file  Relationships  . Social connections:    Talks on phone: Not on file    Gets together: Not on file    Attends religious service: Not on file    Active member of club or organization: Not on file    Attends meetings of clubs or organizations: Not on file     Relationship status: Not on file  . Intimate partner violence:    Fear of current or ex partner: Not on file    Emotionally abused: Not on file    Physically abused: Not on file    Forced sexual activity: Not on file  Other Topics Concern  . Not on file  Social History Narrative  . Not on file    ROS:  Pertinent items are noted in HPI.  PHYSICAL EXAMINATION:    BP 118/70 (BP Location: Right Arm, Patient Position: Sitting, Cuff Size: Normal)   Pulse 80   Resp 16   Wt 167 lb (75.8 kg)   LMP 09/01/2007 (Exact Date)   BMI 27.79 kg/m     General appearance: alert, cooperative and appears stated age  Pelvic: External genitalia:  Rosy pink change to the entire vulva.              Urethra:  normal appearing urethra with no masses, tenderness or lesions              Bartholins and Skenes: normal                 Vagina: normal appearing vagina with normal color and discharge, no lesions.  Some cream noted.               Cervix: no lesions                Bimanual Exam:  Uterus:  8 week size.              Adnexa: no mass, fullness, tenderness           Chaperone was present for exam.  ASSESSMENT  Vulvovaginitis.  Probably multifactorial.  Pad use, atrophy, possible yeast.  Recent DC of HRT.  Known fibroids.  PLAN  Affirm.  Triamcinolone and Nystatin ointment 1/1 Mix bid x 7 day.  Premarin vaginal cream.  Coupon given.  Avoid pad use.  I discussed SSRIs and SNRIs for vasomotor symptoms if needed. Follow up prn.    An After Visit Summary was printed and given to the patient.  ___15___ minutes face to face time of which over 50% was spent in counseling.

## 2017-11-19 LAB — VAGINITIS/VAGINOSIS, DNA PROBE
Candida Species: NEGATIVE
Gardnerella vaginalis: POSITIVE — AB
Trichomonas vaginosis: NEGATIVE

## 2017-11-22 ENCOUNTER — Other Ambulatory Visit: Payer: Self-pay | Admitting: *Deleted

## 2017-11-22 MED ORDER — METRONIDAZOLE 500 MG PO TABS: 500.0000 mg | ORAL_TABLET | Freq: Two times a day (BID) | ORAL | 0 refills | Status: DC

## 2018-01-03 ENCOUNTER — Telehealth: Payer: Self-pay | Admitting: Obstetrics and Gynecology

## 2018-01-03 NOTE — Telephone Encounter (Signed)
Returned patient's call. She wanted name of antibiotic given to treat BV in March. Advised patient she was given Flagyl.

## 2018-01-03 NOTE — Telephone Encounter (Signed)
Patient would like to know the name of the antibiotic dr Quincy Simmonds prescribed for her in March. Ok to leave a detailed message on cell voicemail.

## 2018-03-24 DIAGNOSIS — C44319 Basal cell carcinoma of skin of other parts of face: Secondary | ICD-10-CM | POA: Diagnosis not present

## 2018-04-26 DIAGNOSIS — C4401 Basal cell carcinoma of skin of lip: Secondary | ICD-10-CM | POA: Diagnosis not present

## 2018-06-16 NOTE — Progress Notes (Signed)
62 y.o. G57P1001 Married Caucasian female here for annual exam.    No further vaginal bleeding.   Stopped her HRT.  Having hot flashes.  Not sleeping well. Taking black cohosh and Estroven.  Is still using Premarin cream.  Saw Dr. Collene Mares for diarrhea after she took Flagyl. She was treated for C difficile.  Labs with PCP.   PCP:  [name], MD    Patient's last menstrual period was 09/01/2007 (exact date).           Sexually active: No.  The current method of family planning is tubal ligation.    Exercising: Yes.    cleans horses and stall Smoker:  Former  Health Maintenance: Pap:06-09-17 Neg:Neg HR HPV, 05-02-15 Neg:Neg HR HPV History of abnormal Pap:  no MMG:  06-04-17 3D/Neg/Density C/BiRads1 Colonoscopy: 2016 normal per pt., 05/2012 polyps Dr.Mann BMD:  n/a  Result  n/a TDaP:  2010 Gardasil:   no HIV: recommended.  Thinks she may have done this in the past.  Hep C: recommended Screening Labs:  ---   reports that she quit smoking about 22 years ago. Her smoking use included cigarettes. She has never used smokeless tobacco. She reports that she does not drink alcohol or use drugs.  Past Medical History:  Diagnosis Date  . Contact lens/glasses fitting    wears contacts or glasses  . Diarrhea    --post gallbladder surgery  . No pertinent past medical history   . Vertigo     Past Surgical History:  Procedure Laterality Date  . BLADDER SUSPENSION    . BREAST BIOPSY  09/08/2012   Procedure: BREAST BIOPSY;  Surgeon: Odis Hollingshead, MD;  Location: [place];  Service: General;  Laterality: Left;  remove left breast mass  . BREAST SURGERY     lumpectomy x2  . BUNIONECTOMY Right 01/2016  . CHOLECYSTECTOMY    . COLONOSCOPY    . TUBAL LIGATION      Current Outpatient Medications  Medication Sig Dispense Refill  . conjugated estrogens (PREMARIN) vaginal cream Use 1/2 g vaginally every night at bed time for the first 2 weeks, then use 1/2 g vaginally  two or three times per week. 30 g 2  . nystatin ointment (MYCOSTATIN) Apply 1 application topically 2 (two) times daily. Apply to affected area for up to 7 days. 30 g 0  . OVER THE COUNTER MEDICATION Vitamin C, B complex & quercetin    . triamcinolone (KENALOG) 0.025 % ointment Apply 1 application topically 2 (two) times daily. Use for one week. 30 g 0   No current facility-administered medications for this visit.     Family History  Problem Relation Age of Onset  . Cancer Father        colon  . Hypertension Father   . Depression Mother   . Dementia Mother   . Thyroid disease Sister   . Heart disease Sister     Review of Systems  All other systems reviewed and are negative.   Exam:   BP (!) 148/90   Pulse 75   Wt 166 lb (75.3 kg)   LMP 09/01/2007 (Exact Date)   SpO2 98%   BMI 27.62 kg/m     General appearance: alert, cooperative and appears stated age Head: Normocephalic, without obvious abnormality, atraumatic Neck: no adenopathy, supple, symmetrical, trachea midline and thyroid normal to inspection and palpation Lungs: clear to auscultation bilaterally Breasts: normal appearance, no masses or tenderness, No nipple retraction or dimpling,  No nipple discharge or bleeding, No axillary or supraclavicular adenopathy Heart: regular rate and rhythm Abdomen: soft, non-tender; no masses, no organomegaly Extremities: extremities normal, atraumatic, no cyanosis or edema Skin: Skin color, texture, turgor normal. No rashes or lesions Lymph nodes: Cervical, supraclavicular, and axillary nodes normal. No abnormal inguinal nodes palpated Neurologic: Grossly normal  Pelvic: External genitalia:  no lesions              Urethra:  normal appearing urethra with no masses, tenderness or lesions              Bartholins and Skenes: normal                 Vagina: normal appearing vagina with normal color and discharge, no lesions              Cervix: no lesions              Pap taken:  No. Bimanual Exam:  Uterus:  6 - 7 week size.              Adnexa: no mass, fullness, tenderness              Rectal exam: Yes.  .  Confirms.              Anus:  normal sphincter tone, no lesions  Chaperone was present for exam.  Assessment:   Well woman visit with normal exam. Fibroids. On HRT. Hx C Difficile following Flagyl. FH colon cancer.   Plan: Mammogram screening. Recommended self breast awareness. Pap and HR HPV as above. Guidelines for Calcium, Vitamin D, regular exercise program including cardiovascular and weight bearing exercise. Refills of vaginal estrogen.   We discussed potential effect on breast cancer.  She will check in her next colonoscopy date.  Flu vaccine recommended.  She will check her BP at work periodically.  Follow up annually and prn.   After visit summary provided.

## 2018-06-17 ENCOUNTER — Encounter: Payer: Self-pay | Admitting: Obstetrics and Gynecology

## 2018-06-17 ENCOUNTER — Ambulatory Visit (INDEPENDENT_AMBULATORY_CARE_PROVIDER_SITE_OTHER): Payer: Commercial Managed Care - PPO | Admitting: Obstetrics and Gynecology

## 2018-06-17 VITALS — BP 148/90 | HR 75 | Wt 166.0 lb

## 2018-06-17 DIAGNOSIS — Z01419 Encounter for gynecological examination (general) (routine) without abnormal findings: Secondary | ICD-10-CM

## 2018-06-17 DIAGNOSIS — Z8619 Personal history of other infectious and parasitic diseases: Secondary | ICD-10-CM | POA: Insufficient documentation

## 2018-06-17 MED ORDER — ESTROGENS, CONJUGATED 0.625 MG/GM VA CREA: TOPICAL_CREAM | VAGINAL | 0 refills | Status: DC

## 2018-06-17 NOTE — Patient Instructions (Signed)

## 2019-06-23 ENCOUNTER — Other Ambulatory Visit: Payer: Self-pay

## 2019-06-23 ENCOUNTER — Ambulatory Visit (INDEPENDENT_AMBULATORY_CARE_PROVIDER_SITE_OTHER): Payer: Commercial Managed Care - PPO | Admitting: Obstetrics and Gynecology

## 2019-06-23 ENCOUNTER — Encounter: Payer: Self-pay | Admitting: Obstetrics and Gynecology

## 2019-06-23 ENCOUNTER — Other Ambulatory Visit (HOSPITAL_COMMUNITY)
Admission: RE | Admit: 2019-06-23 | Discharge: 2019-06-23 | Disposition: A | Discharge status: Home / Self Care | Payer: Commercial Managed Care - PPO | Source: Ambulatory Visit | Attending: Obstetrics and Gynecology | Admitting: Obstetrics and Gynecology

## 2019-06-23 VITALS — BP 132/70 | HR 76 | Temp 97.6°F | Resp 12 | Ht 64.0 in | Wt 141.2 lb

## 2019-06-23 DIAGNOSIS — Z01419 Encounter for gynecological examination (general) (routine) without abnormal findings: Secondary | ICD-10-CM | POA: Diagnosis not present

## 2019-06-23 DIAGNOSIS — N950 Postmenopausal bleeding: Secondary | ICD-10-CM

## 2019-06-23 DIAGNOSIS — N898 Other specified noninflammatory disorders of vagina: Secondary | ICD-10-CM

## 2019-06-23 NOTE — Patient Instructions (Signed)

## 2019-06-23 NOTE — Progress Notes (Signed)
63 y.o. G37P1001 Married Caucasian female here for annual exam.    States she has dark brown spotting periodically, a couple of times a week and then disappears. No pain or odor associated with this.   She had evaluation for PMB in December 2018.  She has fibroids.  Sonohysterergram showed no filling defects. EMB was benign atrophy.   Off HRT.  Having hot flashes which are tolerable. Using black cohosh.   Saw a urologist for "recurrent UTIs" but her cultures were negative. She went to pelvic floor therapy.  Feeling better now.  No pain.   Has lost weight.  Lost her horse and her dog this spring.   PCP: [name], MD     Patient's last menstrual period was 09/01/2007 (exact date).           Sexually active: No.  The current method of family planning is tubal ligation.    Exercising: Yes.    walking Smoker:  Former  Health Maintenance: Pap:  06-09-17 Neg:Neg HR HPV History of abnormal Pap:  no MMG:  06/14/19 -- [facility] to fax report -- patient to have right breast biopsy July 03, 2019 Colonoscopy:  2016 normal per pt., 05/2012 polyps Dr.Mann BMD:   none  Result  none TDaP:  2010 Gardasil:   no HIV: unsure if done in the past Hep C: unsure if done in the past Screening Labs: PCP   reports that she quit smoking about 23 years ago. Her smoking use included cigarettes. She has never used smokeless tobacco. She reports that she does not drink alcohol or use drugs.  Past Medical History:  Diagnosis Date  . Contact lens/glasses fitting    wears contacts or glasses  . Diarrhea    --post gallbladder surgery  . No pertinent past medical history   . Vertigo     Past Surgical History:  Procedure Laterality Date  . BLADDER SUSPENSION    . BREAST BIOPSY  09/08/2012   Procedure: BREAST BIOPSY;  Surgeon: Odis Hollingshead, MD;  Location: [place] [facility];  Service: General;  Laterality: Left;  remove left breast mass  . BREAST SURGERY     lumpectomy x2  .  BUNIONECTOMY Right 01/2016  . CHOLECYSTECTOMY    . COLONOSCOPY    . TUBAL LIGATION      Current Outpatient Medications  Medication Sig Dispense Refill  . BLACK COHOSH EXTRACT PO Take by mouth.    . conjugated estrogens (PREMARIN) vaginal cream Use 1/2 g vaginally two or three times per week. 30 g 0  . CRANBERRY PO Take by mouth.    . nystatin ointment (MYCOSTATIN) Apply 1 application topically 2 (two) times daily. Apply to affected area for up to 7 days. 30 g 0  . OVER THE COUNTER MEDICATION Vitamin C, B complex & quercetin    . triamcinolone (KENALOG) 0.025 % ointment Apply 1 application topically 2 (two) times daily. Use for one week. 30 g 0  . valsartan (DIOVAN) 80 MG tablet Take 80 mg by mouth at bedtime.     No current facility-administered medications for this visit.     Family History  Problem Relation Age of Onset  . Cancer Father        colon  . Hypertension Father   . Depression Mother   . Dementia Mother   . Thyroid disease Sister   . Heart disease Sister     Review of Systems  Constitutional: Negative.   HENT: Negative.  Eyes: Negative.   Respiratory: Negative.   Cardiovascular: Negative.   Gastrointestinal: Negative.   Endocrine: Negative.   Genitourinary: Positive for vaginal discharge.  Musculoskeletal: Negative.   Skin: Negative.   Allergic/Immunologic: Negative.   Neurological: Negative.   Hematological: Negative.   Psychiatric/Behavioral: Negative.     Exam:   BP 132/70 (BP Location: Right Arm, Patient Position: Sitting, Cuff Size: Normal)   Pulse 76   Temp 97.6 F (36.4 C) (Temporal)   Resp 12   Ht 5\' 4"  (1.626 m)   Wt 141 lb 3.2 oz (64 kg)   LMP 09/01/2007 (Exact Date)   BMI 24.24 kg/m     General appearance: alert, cooperative and appears stated age Head: normocephalic, without obvious abnormality, atraumatic Neck: no adenopathy, supple, symmetrical, trachea midline and thyroid normal to inspection and palpation Lungs: clear to  auscultation bilaterally Breasts: normal appearance, no masses or tenderness, No nipple retraction or dimpling, No nipple discharge or bleeding, No axillary adenopathy Heart: regular rate and rhythm Abdomen: soft, non-tender; no masses, no organomegaly Extremities: extremities normal, atraumatic, no cyanosis or edema Skin: skin color, texture, turgor normal. No rashes or lesions Lymph nodes: cervical, supraclavicular, and axillary nodes normal. Neurologic: grossly normal  Pelvic: External genitalia:  no lesions              No abnormal inguinal nodes palpated.              Urethra:  normal appearing urethra with no masses, tenderness or lesions              Bartholins and Skenes: normal                 Vagina: normal appearing vagina with normal color and discharge, no lesions              Cervix: no lesions              Pap taken: Yes.   Bimanual Exam:  Uterus:  6 - 7 week size with posterior LUS fibroid noted.              Adnexa: no mass, fullness, tenderness              Rectal exam: Yes.  .  Confirms.              Anus:  normal sphincter tone, no lesions  Chaperone was present for exam.  Assessment:   Well woman visit with normal exam. Right breast mass.  Fibroids. Postmenopausal bleeding.  Recurrent.  ?vaginal discharge? Using vaginal estrogen cream.  Hx C Difficile following Flagyl. FH colon cancer.   Plan: Mammogram screening discussed. Self breast awareness reviewed. Right breast bx planned. Pap and HR HPV as above. Guidelines for Calcium, Vitamin D, regular exercise program including cardiovascular and weight bearing exercise. Stop vaginal estrogen.  Nuswab sent.  We discussed postmenopausal bleeding and potential dx of atrophy, fibroids, polyp and cancer.  Return for sonohysterogram and EMB. Follow up annually and prn.   After visit summary provided.

## 2019-06-25 LAB — NUSWAB VAGINITIS PLUS (VG+)
Candida albicans, NAA: NEGATIVE
Candida glabrata, NAA: NEGATIVE
Chlamydia trachomatis, NAA: NEGATIVE
Neisseria gonorrhoeae, NAA: NEGATIVE
Trich vag by NAA: NEGATIVE

## 2019-06-27 LAB — CYTOLOGY - PAP
Comment: NEGATIVE
Diagnosis: NEGATIVE
Diagnosis: REACTIVE
High risk HPV: NEGATIVE

## 2019-06-28 ENCOUNTER — Other Ambulatory Visit: Payer: Self-pay | Admitting: *Deleted

## 2019-06-28 DIAGNOSIS — N950 Postmenopausal bleeding: Secondary | ICD-10-CM

## 2019-07-03 ENCOUNTER — Other Ambulatory Visit: Payer: Self-pay | Admitting: Radiology

## 2019-07-03 ENCOUNTER — Encounter: Payer: Self-pay | Admitting: Obstetrics and Gynecology

## 2019-07-04 ENCOUNTER — Other Ambulatory Visit: Payer: Self-pay

## 2019-07-06 ENCOUNTER — Ambulatory Visit (INDEPENDENT_AMBULATORY_CARE_PROVIDER_SITE_OTHER): Payer: Commercial Managed Care - PPO | Admitting: Obstetrics and Gynecology

## 2019-07-06 ENCOUNTER — Encounter: Payer: Self-pay | Admitting: *Deleted

## 2019-07-06 ENCOUNTER — Other Ambulatory Visit: Payer: Self-pay | Admitting: Obstetrics and Gynecology

## 2019-07-06 ENCOUNTER — Telehealth: Payer: Self-pay | Admitting: Hematology

## 2019-07-06 ENCOUNTER — Other Ambulatory Visit: Payer: Self-pay

## 2019-07-06 ENCOUNTER — Encounter: Payer: Self-pay | Admitting: Obstetrics and Gynecology

## 2019-07-06 ENCOUNTER — Ambulatory Visit (INDEPENDENT_AMBULATORY_CARE_PROVIDER_SITE_OTHER): Payer: Commercial Managed Care - PPO

## 2019-07-06 ENCOUNTER — Other Ambulatory Visit: Payer: Self-pay | Admitting: *Deleted

## 2019-07-06 VITALS — BP 146/78 | HR 76 | Temp 97.8°F | Ht 64.0 in | Wt 139.4 lb

## 2019-07-06 DIAGNOSIS — Z170 Estrogen receptor positive status [ER+]: Secondary | ICD-10-CM | POA: Insufficient documentation

## 2019-07-06 DIAGNOSIS — N83201 Unspecified ovarian cyst, right side: Secondary | ICD-10-CM

## 2019-07-06 DIAGNOSIS — N950 Postmenopausal bleeding: Secondary | ICD-10-CM

## 2019-07-06 DIAGNOSIS — D219 Benign neoplasm of connective and other soft tissue, unspecified: Secondary | ICD-10-CM | POA: Diagnosis not present

## 2019-07-06 DIAGNOSIS — C50311 Malignant neoplasm of lower-inner quadrant of right female breast: Secondary | ICD-10-CM | POA: Insufficient documentation

## 2019-07-06 NOTE — Telephone Encounter (Signed)
Spoke with patient to confirm afternoon [facility] appointment for 11/11, packet mailed to patient

## 2019-07-06 NOTE — Progress Notes (Signed)
GYNECOLOGY  VISIT   HPI: 63 y.o.   Married  Caucasian  female   G1P1001 with Patient's last menstrual period was 09/01/2007 (exact date).   here for pelvic ultrasound due to periodic dark brown vaginal spotting.   She had evaluation for PMB in December 2018.  She has fibroids.  Sonohysterergram showed no filling defects. EMB was benign atrophy.   New dx right breast cancer.   GYNECOLOGIC HISTORY: Patient's last menstrual period was 09/01/2007 (exact date). Contraception: Tubal Menopausal hormone therapy: none Last mammogram:  06-14-19   Last pap smear:06-09-17 Neg:Neg HR HPV,05-02-15 Neg:Neg HR Neg        OB History    Gravida  1   Para  1   Term  1   Preterm      AB      Living  1     SAB      TAB      Ectopic      Multiple      Live Births                 Patient Active Problem List   Diagnosis Date Noted  . Malignant neoplasm of lower-inner quadrant of right breast of female, estrogen receptor positive ([place]) 07/06/2019  . History of Clostridioides difficile infection 06/17/2018  . Fibroids 08/06/2017  . Breast mass in female-left at 12:00-benign 08/29/2012    Past Medical History:  Diagnosis Date  . Contact lens/glasses fitting    wears contacts or glasses  . Diarrhea    --post gallbladder surgery  . No pertinent past medical history   . Vertigo     Past Surgical History:  Procedure Laterality Date  . BLADDER SUSPENSION    . BREAST BIOPSY  09/08/2012   Procedure: BREAST BIOPSY;  Surgeon: Odis Hollingshead, MD;  Location: [place];  Service: General;  Laterality: Left;  remove left breast mass  . BREAST SURGERY     lumpectomy x2  . BUNIONECTOMY Right 01/2016  . CHOLECYSTECTOMY    . COLONOSCOPY    . TUBAL LIGATION      Current Outpatient Medications  Medication Sig Dispense Refill  . BLACK COHOSH EXTRACT PO Take by mouth.    . CRANBERRY PO Take by mouth.    . mupirocin ointment (BACTROBAN) 2 % APPLY TO AFFECTED AREA  EVERY DAY    . nystatin ointment (MYCOSTATIN) Apply 1 application topically 2 (two) times daily. Apply to affected area for up to 7 days. 30 g 0  . OVER THE COUNTER MEDICATION Vitamin C, B complex & quercetin    . triamcinolone (KENALOG) 0.025 % ointment Apply 1 application topically 2 (two) times daily. Use for one week. 30 g 0  . valsartan (DIOVAN) 80 MG tablet Take 80 mg by mouth at bedtime.     No current facility-administered medications for this visit.      ALLERGIES: Flagyl [metronidazole]  Family History  Problem Relation Age of Onset  . Cancer Father        colon  . Hypertension Father   . Depression Mother   . Dementia Mother   . Thyroid disease Sister   . Heart disease Sister     Social History   Socioeconomic History  . Marital status: Married    Spouse name: Not on file  . Number of children: Not on file  . Years of education: Not on file  . Highest education level: Not on file  Occupational History  .  Not on file  Social Needs  . Financial resource strain: Not on file  . Food insecurity    Worry: Not on file    Inability: Not on file  . Transportation needs    Medical: Not on file    Non-medical: Not on file  Tobacco Use  . Smoking status: Former Smoker    Types: Cigarettes    Quit date: 03/15/1996    Years since quitting: 23.3  . Smokeless tobacco: Never Used  Substance and Sexual Activity  . Alcohol use: No    Alcohol/week: 0.0 standard drinks  . Drug use: No  . Sexual activity: Not Currently    Partners: Male    Birth control/protection: Post-menopausal, Surgical    Comment: BTL  Lifestyle  . Physical activity    Days per week: Not on file    Minutes per session: Not on file  . Stress: Not on file  Relationships  . Social Herbalist on phone: Not on file    Gets together: Not on file    Attends religious service: Not on file    Active member of club or organization: Not on file    Attends meetings of clubs or organizations:  Not on file    Relationship status: Not on file  . Intimate partner violence    Fear of current or ex partner: Not on file    Emotionally abused: Not on file    Physically abused: Not on file    Forced sexual activity: Not on file  Other Topics Concern  . Not on file  Social History Narrative  . Not on file    Review of Systems  All other systems reviewed and are negative.   PHYSICAL EXAMINATION:    BP (!) 146/78   Pulse 76   Temp 97.8 F (36.6 C) (Temporal)   Ht 5\' 4"  (1.626 m)   Wt 139 lb 6.4 oz (63.2 kg)   LMP 09/01/2007 (Exact Date)   BMI 23.93 kg/m     General appearance: alert, cooperative and appears stated age   Pelvic US Uterus with fibroids - largest 49 mm.  EMS 2.90.  Distorted due to fibroids.   Left ovary normal. Right ovary 27 x 23 mm cyst with calcifications and shadowing.  No abnormal blood flow.  No free fluid.    Sonohysterogram/EMB Consent for procedures.  Sterile prep with Hibiclens.  Tenaculum to anterior cervical lip.  Cannula placed and and sterile NS injected.  No filling defects.  Repeat sterile prep of cervix.  EMB performed to 7 cm x 2.  Tissue to pathology.  No complications.  Minimal EBL.  Chaperone was present for exam.  ASSESSMENT  Postmenopausal bleeding.  Uterine fibroids.  Right ovarian cyst.  Right breast cancer.    PLAN  We discussed postmenopausal bleeding and potential etiologies.  We reviewed fibroids and ovarian cysts.  Will check CA125.  Post procedure instructions given. Support for new dx of breast cancer.    An After Visit Summary was printed and given to the patient.  ___25___ minutes face to face time of which over 50% was spent in counseling.

## 2019-07-06 NOTE — Patient Instructions (Signed)

## 2019-07-07 LAB — CA 125: Cancer Antigen (CA) 125: 7.8 U/mL (ref 0.0–38.1)

## 2019-07-09 DIAGNOSIS — N83201 Unspecified ovarian cyst, right side: Secondary | ICD-10-CM | POA: Insufficient documentation

## 2019-07-09 DIAGNOSIS — N950 Postmenopausal bleeding: Secondary | ICD-10-CM | POA: Insufficient documentation

## 2019-07-11 ENCOUNTER — Telehealth: Payer: Self-pay | Admitting: *Deleted

## 2019-07-11 NOTE — Telephone Encounter (Signed)
Received message that patient's husband just received positive COVID results and she is scheduled for clinic tomorrow 11/11.  I spoke with the patient and she was thinking she was going to die in the next 6 months and was planning her funeral. After a long discussion I was able to ease her anxiety and fears concerning her diagnosis and treatment.  [facility] physicians are willing to do virtual visits tomorrow but patient may want to be seen in person on 11/25 after her quarantine is over.  I informed her I would call her after tumor board tomorrow morning with a proposed plan and then she would make a decision on what to do.

## 2019-07-12 ENCOUNTER — Other Ambulatory Visit: Payer: Self-pay

## 2019-07-12 ENCOUNTER — Inpatient Hospital Stay: Payer: Commercial Managed Care - PPO

## 2019-07-12 ENCOUNTER — Inpatient Hospital Stay: Payer: Commercial Managed Care - PPO | Admitting: Hematology

## 2019-07-12 ENCOUNTER — Ambulatory Visit: Payer: Commercial Managed Care - PPO | Admitting: Physical Therapy

## 2019-07-12 ENCOUNTER — Ambulatory Visit
Admission: RE | Admit: 2019-07-12 | Discharge: 2019-07-12 | Disposition: A | Discharge status: Home / Self Care | Payer: Commercial Managed Care - PPO | Source: Ambulatory Visit | Attending: Radiation Oncology | Admitting: Radiation Oncology

## 2019-07-12 ENCOUNTER — Telehealth: Payer: Self-pay | Admitting: *Deleted

## 2019-07-12 DIAGNOSIS — Z20822 Contact with and (suspected) exposure to covid-19: Secondary | ICD-10-CM

## 2019-07-12 NOTE — Progress Notes (Signed)
error 

## 2019-07-12 NOTE — Telephone Encounter (Signed)
Spoke with patient about her [facility] appointment for today.  Informed patient of proposed plan.  Patient states her husband is feeling worse from his COVID and she feels like she could not grasp all of the information trying to take care of him and she is also not computer user friendly.  She will be getting tested today as well.  She would prefer to wait to a later date for clinic when she is cleared or go to see the surgeon 1st.  She will call me with her COVID results and then we can make appointments when is appropriate.

## 2019-07-13 ENCOUNTER — Encounter: Payer: Self-pay | Admitting: Obstetrics and Gynecology

## 2019-07-13 NOTE — Addendum Note (Signed)
Addended by: Yisroel Ramming, Dietrich Pates E on: 07/13/2019 04:38 PM   Modules accepted: Orders

## 2019-07-15 LAB — NOVEL CORONAVIRUS, NAA: SARS-CoV-2, NAA: NOT DETECTED

## 2019-07-18 ENCOUNTER — Telehealth: Payer: Self-pay | Admitting: *Deleted

## 2019-07-18 NOTE — Telephone Encounter (Signed)
Spoke with patient to follow up regarding [place] appt. For 11/25.  She states she tested negative for COVID and I do see negative results in Epic.  She states she has been quarantined at home and staying away from her husband as well who she states is doing much better.  I doubled checked policy regarding her coming to clinic.  Per Dr. Tammi Klippel patient can come to clinic on 11/25.  Confirmed appointment with patient for 8:15am.  Instructions given.

## 2019-07-19 ENCOUNTER — Other Ambulatory Visit: Payer: Self-pay | Admitting: *Deleted

## 2019-07-19 DIAGNOSIS — C50311 Malignant neoplasm of lower-inner quadrant of right female breast: Secondary | ICD-10-CM

## 2019-07-25 NOTE — Progress Notes (Signed)
[name]  [phone] [phone]     ID: AMELIYAH SARNO DOB: [birth year]  MR#: [id number]  SNK#:[id number]  Patient Care Team: [name], MD as PCP - General (Internal Medicine) Mauro Kaufmann, RN as Oncology Nurse Navigator Rockwell Germany, RN as Oncology Nurse Navigator Stark Klein, MD as Consulting Physician (General Surgery) Magrinat, Virgie Dad, MD as Consulting Physician (Oncology) Eppie Gibson, MD as Attending Physician (Radiation Oncology) Nunzio Cobbs, MD as Consulting Physician (Obstetrics and Gynecology) Rolm Bookbinder, MD as Consulting Physician (Dermatology) Ardis Hughs, MD as Consulting Physician (Urology) Juanita Craver, MD as Consulting Physician (Gastroenterology) Chauncey Cruel, MD OTHER MD:  CHIEF COMPLAINT: Estrogen receptor positive breast cancer  CURRENT TREATMENT: Awaiting definitive surgery   HISTORY OF CURRENT ILLNESS: Theresa [name] has a history of prior left breast excision on 09/08/2012. Pathology from this ([id number]) showed: cyst with features of rupture and associated fat necrosis; no evidence of malignancy.  She had routine screening mammography on 06/14/2019 showing a possible abnormality in the right breast. She then underwent right diagnostic mammography with tomography and right breast ultrasonography at [facility] on 06/21/2019 showing: breast density category C; 1.1 cm irregular mass in the right breast at 3:30.; adjacent smaller 6 mm mass in the right breast at 3:30; no suspicious right axillary lymph nodes.  Accordingly on 07/03/2019 she proceeded to biopsy of the right breast areas in question. The pathology from this procedure ([id number]) showed: invasive mammary carcinoma, grade 2, e-cadherin positive. Prognostic indicators significant for: estrogen receptor, 95% positive with strong staining intensity, progesterone receptor, 0% negative. Proliferation marker Ki67 at 5%. HER2 equivocal by  immunohistochemistry (2+), but negative by fluorescent in situ hybridization with a signals ratio 1.58 and number per cell 2.60.  The patient's subsequent history is as detailed below.   INTERVAL HISTORY: Theresa [name] was evaluated in the multidisciplinary breast cancer clinic on 07/26/2019 .  Her husband Theresa [name] participated via speaker phone.  Her case was also presented at the multidisciplinary breast cancer conference on the same day. At that time a preliminary plan was proposed: Given breast density and possible multifocality, breast MRI; Oncotype testing, adjuvant radiation, antiestrogens  She recently underwent coronavirus testing on 07/12/2019, which was positive.   REVIEW OF SYSTEMS: Theresa [name] reports frequent night sweats and breast pain related to her biopsy. There were no specific symptoms leading to the original mammogram, which was routinely scheduled. The patient denies unusual headaches, visual changes, nausea, vomiting, stiff neck, dizziness, or gait imbalance. There has been no cough, phlegm production, or pleurisy, no chest pain or pressure, and no change in bowel or bladder habits. The patient denies fever, rash, bleeding, unexplained fatigue or unexplained weight loss.  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  A detailed review of systems was otherwise negative.   PAST MEDICAL HISTORY: Past Medical History:  Diagnosis Date  . Contact lens/glasses fitting    wears contacts or glasses  . Diarrhea    --post gallbladder surgery  . Family history of bone cancer   . Family history of brain cancer   . Family history of stomach cancer   . Family history of uterine cancer   . Fibroids   . Hypertension   . No pertinent past medical history   . Right ovarian cyst   . 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   PAST SURGICAL HISTORY: Past Surgical History:  Procedure Laterality Date  .  BLADDER SUSPENSION    . BREAST BIOPSY  09/08/2012    Procedure: BREAST BIOPSY;  Surgeon: Theresa Hollingshead, MD;  Location: [facility];  Service: General;  Laterality: Left;  remove left breast mass  . BREAST SURGERY     lumpectomy x2  . BUNIONECTOMY Right 01/2016  . CHOLECYSTECTOMY    . COLONOSCOPY    . TUBAL LIGATION      FAMILY HISTORY: Family History  Problem Relation Age of Onset  . Hypertension Father   . Stomach cancer Father        may have been colon, diagnosed in his 62s  . Depression Mother   . Dementia Mother   . Thyroid disease Sister   . Heart disease Sister   . Brain cancer Maternal Aunt 80  . Cancer Maternal Grandmother        undetermined type, diagnosed in her 62s  . Bone cancer Paternal Grandfather 56  . Uterine cancer Paternal Aunt 76  . Uterine cancer Cousin        paternal 1st cousin, diagnosed in her late 62s  . Uterine cancer Cousin 52       paternal 28st cousin   Patient's father was 77 years old when he died.  He had "stomach cancer", she thinks possibly colon.  Patient's mother died from complications from a fall at age 33. The patient denies a family hx of breast or ovarian cancer. She had no brothers, 2 sisters, 1 of whom died from sepsis.  1 grandmother, a former Armed forces logistics/support/administrative officer, died from what may have been lung cancer.  She also reports uterine cancer in a cousin and possibly an aunt.   GYNECOLOGIC HISTORY:  Patient's last menstrual period was 09/01/2007 (exact date). Menarche: 63 years old Age at first live birth: 63 years old (the patient has a retroverted uterus, likely explaining her difficulty in getting pregnant) [place] P 1 LMP around age 60 Contraceptive used from [id number], no complications HRT used for 5 years, 2013 until 01 /2202 , no complications Hysterectomy? no BSO? no Of note, she has a history of postmenopausal bleeding, and is status post several benign cervical polyps and benign endometrial biopsies; she has a right ovarian cyst followed by Dr. Quincy Simmonds.  Most recent  endometrial biopsy 07/06/2019 was benign, as was CA-125.  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 Her husband Theresa [name] is a Librarian, academic for an Geophysicist/field seismologist. She lives at home with husband Theresa [name]. Son Luisa Hart, age 59, is studying criminal justice at [employer] in [place].     ADVANCED DIRECTIVES: In the absence of any documentation to the contrary, the patient's spouse is their HCPOA.   HEALTH MAINTENANCE: Social History   Tobacco Use  . Smoking status: Former Smoker    Types: Cigarettes    Quit date: 03/15/1996    Years since quitting: 23.3  . Smokeless tobacco: Never Used  Substance Use Topics  . Alcohol use: No    Alcohol/week: 0.0 standard drinks  . Drug use: No     Colonoscopy: date unsure, possibly 2014  PAP: 06/2019, negative  Bone density: never done   Allergies  Allergen Reactions  . Flagyl [Metronidazole]     C-Diff    Current Outpatient Medications  Medication Sig Dispense Refill  . BLACK COHOSH EXTRACT PO Take by mouth.    . CRANBERRY PO Take by mouth.    . Lactobacillus Rhamnosus, GG, (CULTURELLE PO) Take by mouth.    Marland Kitchen  Magnesium 100 MG TABS Take by mouth.    . Melatonin 10 MG CAPS Take by mouth.    Marland Kitchen OVER THE COUNTER MEDICATION Vitamin C, B complex & quercetin    . Pumpkin Seed-Soy Germ (AZO BLADDER CONTROL/GO-LESS PO) Take by mouth.    Marland Kitchen UNABLE TO FIND Tumero XL    . valsartan (DIOVAN) 80 MG tablet Take 80 mg by mouth at bedtime.    . mupirocin ointment (BACTROBAN) 2 % APPLY TO AFFECTED AREA EVERY DAY    . nystatin ointment (MYCOSTATIN) Apply 1 application topically 2 (two) times daily. Apply to affected area for up to 7 days. (Patient not taking: Reported on 07/26/2019) 30 g 0  . tamoxifen (NOLVADEX) 20 MG tablet Take 1 tablet (20 mg total) by mouth daily. 90 tablet 12  . triamcinolone (KENALOG) 0.025 % ointment Apply 1 application topically 2 (two) times daily. Use for one week. (Patient not taking: Reported on  07/26/2019) 30 g 0  . venlafaxine XR (EFFEXOR-XR) 37.5 MG 24 hr capsule Take 1 capsule (37.5 mg total) by mouth daily with breakfast. 90 capsule 4   No current facility-administered medications for this visit.     OBJECTIVE: Middle-aged white woman who appears younger than stated age  36:   07/26/19 0902  BP: (!) 150/72  Pulse: 76  Resp: 17  Temp: 97.8 F (36.6 C)  SpO2: 100%     Body mass index is 23.98 kg/m.   Wt Readings from Last 3 Encounters:  07/26/19 139 lb 11.2 oz (63.4 kg)  07/06/19 139 lb 6.4 oz (63.2 kg)  06/23/19 141 lb 3.2 oz (64 kg)      ECOG FS:1 - Symptomatic but completely ambulatory  Ocular: Sclerae unicteric, pupils round and equal Ear-nose-throat: Wearing a mask Lymphatic: No cervical or supraclavicular adenopathy Lungs no rales or rhonchi Heart regular rate and rhythm Abd soft, nontender, positive bowel sounds MSK no focal spinal tenderness, no joint edema Neuro: non-focal, well-oriented, appropriate affect Breasts: The right breast is status post recent biopsy.  There is a moderate ecchymosis.  I do not palpate a mass.  There are no other skin or nipple changes of concern.  The left breast is benign.  Both axillae are benign.   LAB RESULTS:  CMP     Component Value Date/Time   NA 140 07/26/2019 0830   K 4.3 07/26/2019 0830   CL 104 07/26/2019 0830   CO2 26 07/26/2019 0830   GLUCOSE 107 (H) 07/26/2019 0830   BUN 16 07/26/2019 0830   CREATININE 0.73 07/26/2019 0830   CALCIUM 9.5 07/26/2019 0830   PROT 7.0 07/26/2019 0830   ALBUMIN 4.4 07/26/2019 0830   AST 17 07/26/2019 0830   ALT 19 07/26/2019 0830   ALKPHOS 49 07/26/2019 0830   BILITOT 0.6 07/26/2019 0830   GFRNONAA >60 07/26/2019 0830   GFRAA >60 07/26/2019 0830    No results found for: TOTALPROTELP, ALBUMINELP, A1GS, A2GS, BETS, BETA2SER, GAMS, MSPIKE, SPEI  No results found for: KPAFRELGTCHN, LAMBDASER, KAPLAMBRATIO  Lab Results  Component Value Date   WBC 7.9 07/26/2019    NEUTROABS 4.9 07/26/2019   HGB 13.2 07/26/2019   HCT 39.9 07/26/2019   MCV 92.8 07/26/2019   PLT 230 07/26/2019    _0 @  No results found for: LABCA2  No components found for: ELFYBO175  No results for input(s): INR in the last 168 hours.  No results found for: LABCA2  No results found for: ZWC585  Lab Results  Component  Value Date   CAN125 7.8 07/06/2019    No results found for: TRZ735  No results found for: CA2729  No components found for: HGQUANT  No results found for: CEA1 / No results found for: CEA1   No results found for: AFPTUMOR  No results found for: CHROMOGRNA  No results found for: PSA1  Appointment on 07/26/2019  Component Date Value Ref Range Status  . WBC Count 07/26/2019 7.9  4.0 - 10.5 K/uL Final  . RBC 07/26/2019 4.30  3.87 - 5.11 MIL/uL Final  . Hemoglobin 07/26/2019 13.2  12.0 - 15.0 g/dL Final  . HCT 07/26/2019 39.9  36.0 - 46.0 % Final  . MCV 07/26/2019 92.8  80.0 - 100.0 fL Final  . MCH 07/26/2019 30.7  26.0 - 34.0 pg Final  . MCHC 07/26/2019 33.1  30.0 - 36.0 g/dL Final  . RDW 07/26/2019 12.3  11.5 - 15.5 % Final  . Platelet Count 07/26/2019 230  150 - 400 K/uL Final  . nRBC 07/26/2019 0.0  0.0 - 0.2 % Final  . Neutrophils Relative % 07/26/2019 62  % Final  . Neutro Abs 07/26/2019 4.9  1.7 - 7.7 K/uL Final  . Lymphocytes Relative 07/26/2019 29  % Final  . Lymphs Abs 07/26/2019 2.3  0.7 - 4.0 K/uL Final  . Monocytes Relative 07/26/2019 8  % Final  . Monocytes Absolute 07/26/2019 0.6  0.1 - 1.0 K/uL Final  . Eosinophils Relative 07/26/2019 1  % Final  . Eosinophils Absolute 07/26/2019 0.1  0.0 - 0.5 K/uL Final  . Basophils Relative 07/26/2019 0  % Final  . Basophils Absolute 07/26/2019 0.0  0.0 - 0.1 K/uL Final  . Immature Granulocytes 07/26/2019 0  % Final  . Abs Immature Granulocytes 07/26/2019 0.02  0.00 - 0.07 K/uL Final   Performed at [facility], [place] [address], [place], [place] [address]   . Sodium 07/26/2019 140  135 - 145 mmol/L Final  . Potassium 07/26/2019 4.3  3.5 - 5.1 mmol/L Final  . Chloride 07/26/2019 104  98 - 111 mmol/L Final  . CO2 07/26/2019 26  22 - 32 mmol/L Final  . Glucose, Bld 07/26/2019 107* 70 - 99 mg/dL Final  . BUN 07/26/2019 16  8 - 23 mg/dL Final  . Creatinine 07/26/2019 0.73  0.44 - 1.00 mg/dL Final  . Calcium 07/26/2019 9.5  8.9 - 10.3 mg/dL Final  . Total Protein 07/26/2019 7.0  6.5 - 8.1 g/dL Final  . Albumin 07/26/2019 4.4  3.5 - 5.0 g/dL Final  . AST 07/26/2019 17  15 - 41 U/L Final  . ALT 07/26/2019 19  0 - 44 U/L Final  . Alkaline Phosphatase 07/26/2019 49  38 - 126 U/L Final  . Total Bilirubin 07/26/2019 0.6  0.3 - 1.2 mg/dL Final  . GFR, Est Non Af Am 07/26/2019 >60  >60 mL/min Final  . GFR, Est AFR Am 07/26/2019 >60  >60 mL/min Final  . Anion gap 07/26/2019 10  5 - 15 Final   Performed at [facility], [place] [address] [place],  [address]    (this displays the last labs from the last 3 days)  No results found for: TOTALPROTELP, ALBUMINELP, A1GS, A2GS, BETS, BETA2SER, GAMS, MSPIKE, SPEI (this displays SPEP labs)  No results found for: KPAFRELGTCHN, LAMBDASER, KAPLAMBRATIO (kappa/lambda light chains)  No results found for: HGBA, HGBA2QUANT, HGBFQUANT, HGBSQUAN (Hemoglobinopathy evaluation)   No results found for: LDH  No results found for:  IRON, TIBC, IRONPCTSAT (Iron and TIBC)  No results found for: FERRITIN  Urinalysis    Component Value Date/Time   BILIRUBINUR n 05/15/2016 0902   PROTEINUR n 05/15/2016 0902   UROBILINOGEN negative 05/15/2016 0902   NITRITE n 05/15/2016 0902   LEUKOCYTESUR moderate (2+) (A) 05/15/2016 0902     STUDIES: US Pelvis Complete  Result Date: 07/06/2019 SEE PROGRESS NOTE  [place] Sonohysterogram  Result Date: 07/06/2019 SEE PROGRESS NOTE   ELIGIBLE FOR AVAILABLE RESEARCH PROTOCOL: no  ASSESSMENT: 63 y.o. [place] woman status post right lower inner  quadrant biopsy 07/03/2019 for a clinicaly multiple T1c N0, stage IA invasive ductal carcinoma, grade 2, E-cadherin positive, strongly estrogen receptor positive but progesterone receptor negative and HER-2 not amplified, with an MIB-1 of 5%  (1) tamoxifen started neoadjuvantly 07/26/2019  (2) definitive surgery pending  (3) Oncotype to be obtained from the definitive surgical sample  (4) adjuvant radiation as appropriate  PLAN: I spent approximately 60 minutes face to face with Tirza with more than 50% of that time spent in counseling and coordination of care. Specifically we reviewed the biology of the patient's diagnosis and the specifics of her situation.  We first reviewed the fact that cancer is not one disease but more than 100 different diseases and that it is important to keep them separate-- otherwise when friends and relatives discuss their own cancer experiences with Phil confusion can result. Similarly we explained that if breast cancer spreads to the bone or liver, the patient would not have bone cancer or liver cancer, but breast cancer in the bone and breast cancer in the liver: one cancer in three places-- not 3 different cancers which otherwise would have to be treated in 3 different ways.  We discussed the difference between local and systemic therapy. In terms of loco-regional treatment, lumpectomy plus radiation is equivalent to mastectomy as far as survival is concerned. For this reason, and because the cosmetic results are generally superior, we recommend breast conserving surgery assuming this will be cosmetically feasible.  Amaya is being scheduled for a breast MRI to evaluate this further.  We also noted that in terms of sequencing of treatments, whether systemic therapy or surgery is done first does not affect the ultimate outcome.  We are starting tamoxifen neoadjuvantly as discussed below in light of possible surgical delays.  We then discussed the rationale for systemic  therapy. There is some risk that this cancer may have already spread to other parts of her body. Patients frequently ask at this point about bone scans, CAT scans and PET scans to find out if they have occult breast cancer somewhere else. The problem is that in early stage disease we are much more likely to find false positives then true cancers and this would expose the patient to unnecessary procedures as well as unnecessary radiation. Scans cannot answer the question the patient really would like to know, which is whether she has microscopic disease elsewhere in her body. For those reasons we do not recommend them.  Of course we would proceed to aggressive evaluation of any symptoms that might suggest metastatic disease, but that is not the case here.  Next we went over the options for systemic therapy which are anti-estrogens, anti-HER-2 immunotherapy, and chemotherapy. Ebany does not meet criteria for anti-HER-2 immunotherapy. She is a good candidate for anti-estrogens.  The question of chemotherapy is more complicated. Chemotherapy is most effective in rapidly growing, aggressive tumors. It is much less effective in low-grade, slow growing  cancers, like Anaia 's. For that reason we are going to request an Oncotype from the definitive surgical sample, as suggested by NCCN guidelines. That will help [place] make a definitive decision regarding chemotherapy in this case.  Given the fact that the holidays are coming on, we have an MRI pending, and then I may need to consider mastectomy and reconstruction options, all of which may delay her definitive treatment, we discussed starting tamoxifen now.  She has a good understanding of the possible toxicities side effects and complications of this agent as well as the possible benefits.  She is very concerned about hot flashes which are already a nuisance to her even before starting tamoxifen.  We discussed venlafaxine and I went ahead and placed a prescription for  her however I asked her to start the tamoxifen first and then only if the hot flashes become more of an issue add the venlafaxine.  Ayshia has a good understanding of the overall plan. She agrees with it. She knows the goal of treatment in her case is cure. She will call with any problems that may develop before her next visit here.   Chauncey Cruel, MD   07/27/2019 11:12 AM Medical Oncology and Hematology [facility] [address] [place], [place] [address] Tel. [phone]    Fax. [phone]   This document serves as a record of services personally performed by Lurline Del, MD. It was created on his behalf by Wilburn Mylar, a trained medical scribe. The creation of this record is based on the scribe's personal observations and the provider's statements to them.   I, Lurline Del MD, have reviewed the above documentation for accuracy and completeness, and I agree with the above.

## 2019-07-26 ENCOUNTER — Encounter: Payer: Self-pay | Admitting: Oncology

## 2019-07-26 ENCOUNTER — Ambulatory Visit (HOSPITAL_BASED_OUTPATIENT_CLINIC_OR_DEPARTMENT_OTHER): Payer: Commercial Managed Care - PPO | Admitting: Genetic Counselor

## 2019-07-26 ENCOUNTER — Encounter: Payer: Self-pay | Admitting: Radiation Oncology

## 2019-07-26 ENCOUNTER — Ambulatory Visit
Admission: RE | Admit: 2019-07-26 | Discharge: 2019-07-26 | Disposition: A | Discharge status: Home / Self Care | Payer: Commercial Managed Care - PPO | Source: Ambulatory Visit | Attending: Radiation Oncology | Admitting: Radiation Oncology

## 2019-07-26 ENCOUNTER — Other Ambulatory Visit: Payer: Self-pay

## 2019-07-26 ENCOUNTER — Inpatient Hospital Stay: Payer: Commercial Managed Care - PPO | Attending: Oncology | Admitting: Oncology

## 2019-07-26 ENCOUNTER — Inpatient Hospital Stay: Payer: Commercial Managed Care - PPO

## 2019-07-26 ENCOUNTER — Encounter: Payer: Self-pay | Admitting: Genetic Counselor

## 2019-07-26 ENCOUNTER — Other Ambulatory Visit: Payer: Self-pay | Admitting: *Deleted

## 2019-07-26 VITALS — BP 150/72 | HR 76 | Temp 97.8°F | Resp 17 | Ht 64.0 in | Wt 139.7 lb

## 2019-07-26 DIAGNOSIS — C50311 Malignant neoplasm of lower-inner quadrant of right female breast: Secondary | ICD-10-CM | POA: Diagnosis present

## 2019-07-26 DIAGNOSIS — Z808 Family history of malignant neoplasm of other organs or systems: Secondary | ICD-10-CM

## 2019-07-26 DIAGNOSIS — Z79899 Other long term (current) drug therapy: Secondary | ICD-10-CM

## 2019-07-26 DIAGNOSIS — Z8049 Family history of malignant neoplasm of other genital organs: Secondary | ICD-10-CM | POA: Diagnosis not present

## 2019-07-26 DIAGNOSIS — Z800 Family history of malignant neoplasm of digestive organs: Secondary | ICD-10-CM | POA: Diagnosis not present

## 2019-07-26 DIAGNOSIS — Z170 Estrogen receptor positive status [ER+]: Secondary | ICD-10-CM

## 2019-07-26 DIAGNOSIS — Z8249 Family history of ischemic heart disease and other diseases of the circulatory system: Secondary | ICD-10-CM | POA: Diagnosis not present

## 2019-07-26 DIAGNOSIS — Z79810 Long term (current) use of selective estrogen receptor modulators (SERMs): Secondary | ICD-10-CM | POA: Diagnosis not present

## 2019-07-26 DIAGNOSIS — I10 Essential (primary) hypertension: Secondary | ICD-10-CM | POA: Insufficient documentation

## 2019-07-26 DIAGNOSIS — Z87891 Personal history of nicotine dependence: Secondary | ICD-10-CM

## 2019-07-26 LAB — CBC WITH DIFFERENTIAL (CANCER CENTER ONLY)
Abs Immature Granulocytes: 0.02 10*3/uL (ref 0.00–0.07)
Basophils Absolute: 0 10*3/uL (ref 0.0–0.1)
Basophils Relative: 0 %
Eosinophils Absolute: 0.1 10*3/uL (ref 0.0–0.5)
Eosinophils Relative: 1 %
HCT: 39.9 % (ref 36.0–46.0)
Hemoglobin: 13.2 g/dL (ref 12.0–15.0)
Immature Granulocytes: 0 %
Lymphocytes Relative: 29 %
Lymphs Abs: 2.3 10*3/uL (ref 0.7–4.0)
MCH: 30.7 pg (ref 26.0–34.0)
MCHC: 33.1 g/dL (ref 30.0–36.0)
MCV: 92.8 fL (ref 80.0–100.0)
Monocytes Absolute: 0.6 10*3/uL (ref 0.1–1.0)
Monocytes Relative: 8 %
Neutro Abs: 4.9 10*3/uL (ref 1.7–7.7)
Neutrophils Relative %: 62 %
Platelet Count: 230 10*3/uL (ref 150–400)
RBC: 4.3 MIL/uL (ref 3.87–5.11)
RDW: 12.3 % (ref 11.5–15.5)
WBC Count: 7.9 10*3/uL (ref 4.0–10.5)
nRBC: 0 % (ref 0.0–0.2)

## 2019-07-26 LAB — CMP (CANCER CENTER ONLY)
ALT: 19 U/L (ref 0–44)
AST: 17 U/L (ref 15–41)
Albumin: 4.4 g/dL (ref 3.5–5.0)
Alkaline Phosphatase: 49 U/L (ref 38–126)
Anion gap: 10 (ref 5–15)
BUN: 16 mg/dL (ref 8–23)
CO2: 26 mmol/L (ref 22–32)
Calcium: 9.5 mg/dL (ref 8.9–10.3)
Chloride: 104 mmol/L (ref 98–111)
Creatinine: 0.73 mg/dL (ref 0.44–1.00)
GFR, Est AFR Am: >60 mL/min (ref >60)
GFR, Estimated: >60 mL/min (ref >60)
Glucose, Bld: 107 mg/dL — ABNORMAL HIGH (ref 70–99)
Potassium: 4.3 mmol/L (ref 3.5–5.1)
Sodium: 140 mmol/L (ref 135–145)
Total Bilirubin: 0.6 mg/dL (ref 0.3–1.2)
Total Protein: 7 g/dL (ref 6.5–8.1)

## 2019-07-26 MED ORDER — VENLAFAXINE HCL ER 37.5 MG PO CP24: 37.5000 mg | ORAL_CAPSULE | Freq: Every day | ORAL | 4 refills | Status: DC

## 2019-07-26 MED ORDER — TAMOXIFEN CITRATE 20 MG PO TABS: 20.0000 mg | ORAL_TABLET | Freq: Every day | ORAL | 12 refills | Status: AC

## 2019-07-26 NOTE — Progress Notes (Signed)
REFERRING PROVIDER: Chauncey Cruel, MD [address] [place],  [place] [address]  PRIMARY PROVIDER:  [name], MD  PRIMARY REASON FOR VISIT:  1. Malignant neoplasm of lower-inner quadrant of right breast of female, estrogen receptor positive ([place])   2. Family history of uterine cancer   3. Family history of stomach cancer   4. Family history of bone cancer   5. Family history of brain cancer      I connected with [name] on 07/26/2019 at 11:20 am EDT by Webex video conference and verified that I am speaking with the correct person using two identifiers.   Patient location: clinic Provider location: office  HISTORY OF PRESENT ILLNESS:   [name], a 63 y.o. female, was seen for a Theresa [name] cancer genetics consultation at the request of Dr. Jana [name] due to a personal history of breast cancer and a family history of stomach and uterine cancer.  [name] presents to clinic today to discuss the possibility of a hereditary predisposition to cancer, genetic testing, and to further clarify her future cancer risks, as well as potential cancer risks for family members.   In 2020, at the age of 66, 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  CANCER HISTORY:  Oncology History   No history exists.     RISK FACTORS:  Menarche was at age 60.  First live birth at age 43.  OCP use for approximately 15-16 years.  Ovaries intact: yes.  Hysterectomy: no.  Menopausal status: postmenopausal, 53.  HRT use: 5-6 years. Colonoscopy: yes; 1-2 polyps, per patient. Mammogram within the last year: yes. Number of breast biopsies: 3-4. Any excessive radiation exposure in the past: no  Past Medical History:  Diagnosis Date  . Contact lens/glasses fitting    wears contacts or glasses  . Diarrhea    --post gallbladder surgery  . Family history of bone cancer   . Family history of brain cancer   . Family history of  stomach cancer   . Family history of uterine cancer   . Fibroids   . Hypertension   . No pertinent past medical history   . Right ovarian cyst   . Vertigo     Past Surgical History:  Procedure Laterality Date  . BLADDER SUSPENSION    . BREAST BIOPSY  09/08/2012   Procedure: BREAST BIOPSY;  Surgeon: Odis Hollingshead, MD;  Location: [facility];  Service: General;  Laterality: Left;  remove left breast mass  . BREAST SURGERY     lumpectomy x2  . BUNIONECTOMY Right 01/2016  . CHOLECYSTECTOMY    . COLONOSCOPY    . TUBAL LIGATION      Social History   Socioeconomic History  . Marital status: Married    Spouse name: Not on file  . Number of children: Not on file  . Years of education: Not on file  . Highest education level: Not on file  Occupational History  . Not on file  Social Needs  . Financial resource strain: Not on file  . Food insecurity    Worry: Not on file    Inability: Not on file  . Transportation needs    Medical: Not on file    Non-medical: Not on file  Tobacco Use  . Smoking status: Former Smoker    Types: Cigarettes    Quit date: 03/15/1996    Years since quitting: 23.3  . Smokeless  tobacco: Never Used  Substance and Sexual Activity  . Alcohol use: No    Alcohol/week: 0.0 standard drinks  . Drug use: No  . Sexual activity: Not Currently    Partners: Male    Birth control/protection: Post-menopausal, Surgical    Comment: BTL  Lifestyle  . Physical activity    Days per week: Not on file    Minutes per session: Not on file  . Stress: Not on file  Relationships  . Social Herbalist on phone: Not on file    Gets together: Not on file    Attends religious service: Not on file    Active member of club or organization: Not on file    Attends meetings of clubs or organizations: Not on file    Relationship status: Not on file  Other Topics Concern  . Not on file  Social History Narrative  . Not on file     FAMILY HISTORY:   We obtained a detailed, 4-generation family history.  Significant diagnoses are listed below: Family History  Problem Relation Age of Onset  . Hypertension Father   . Stomach cancer Father        may have been colon, diagnosed in his 32s  . Depression Mother   . Dementia Mother   . Thyroid disease Sister   . Heart disease Sister   . Brain cancer Maternal Aunt 80  . Cancer Maternal Grandmother        undetermined type, diagnosed in her 63s  . Bone cancer Paternal Grandfather 61  . Uterine cancer Paternal Aunt 36  . Uterine cancer Cousin        paternal 1st cousin, diagnosed in her late 91s  . Uterine cancer Cousin 39       paternal 1st cousin   [name] has one son who is currently 38. She has two sisters, one of whom passed away at the age of 77. None of these individuals have had cancer.  [name] mother died at the age of 22 and did not have cancer. She had one maternal aunt who died from brain cancer in her 63s. Her maternal grandmother died of an unspecified type of cancer at age 45, and her grandfather died in his 32s from pneumonia. She does not know if any cousins on the maternal side of the family have had cancer.  [name] father is deceased and had a history of either stomach cancer or colon cancer diagnosed in his 61s. She had five paternal uncles and five paternal aunts. One of her aunts had uterine cancer diagnosed when she was 54. She also has two paternal first cousins who have had uterine cancer - one diagnosed in her late 41s and one diagnosed around age 61. Her paternal grandmother died at age 26, and her paternal grandfather died at age 73 from bone cancer.  [name] is unaware of previous family history of genetic testing for hereditary cancer risks.   GENETIC COUNSELING ASSESSMENT: [name] is a 63 y.o. female with a family history of stomach and uterine cancer, which is somewhat suggestive of a hereditary cancer syndrome and predisposition to cancer.  We, therefore, discussed and recommended the following at today's visit.   DISCUSSION: We discussed that approximately 5-10% of all cancer is hereditary.  Most cases of hereditary uterine cancer are associated with Lynch syndrome, a genetic condition that increases the risk for colon and uterine cancer, among other cancers (including stomach cancer).  There  are hereditary breast cancer syndromes as well, although Ms. Suchy's personal and family history of cancer is not suggestive of a hereditary breast cancer syndrome.  We discussed that testing is beneficial for several reasons including knowing about other cancer risks, identifying potential screening and risk-reduction options that may be appropriate, and to understand if other family members could be at risk for cancer and allow them to undergo genetic testing.   We reviewed the characteristics, features and inheritance patterns of hereditary cancer syndromes. We also discussed genetic testing, including the appropriate family members to test, the process of testing, insurance coverage and turn-around-time for results. We discussed the implications of a negative, positive and/or variant of uncertain significant result. In order to get genetic test results in a timely manner so that Ms. Ranker can use these genetic test results for surgical decisions, we recommended [name] pursue genetic testing for the [facility] Breast Cancer STAT panel. Once complete, we recommend [name] pursue reflex genetic testing to the Common Hereditary Cancers panel.   The STAT Breast cancer panel offered by [facility] includes sequencing and rearrangement analysis for the following 9 genes:  ATM, BRCA1, BRCA2, CDH1, CHEK2, PALB2, PTEN, STK11 and TP53.     The Common Hereditary Cancers Panel offered by [facility] includes sequencing and/or deletion duplication testing of the following 48 genes: APC, ATM, AXIN2, BARD1, BMPR1A, BRCA1, BRCA2, BRIP1, CDH1, CDK4, CDKN2A (p14ARF),  CDKN2A (p16INK4a), CHEK2, CTNNA1, DICER1, EPCAM (Deletion/duplication testing only), GREM1 (promoter region deletion/duplication testing only), KIT, MEN1, MLH1, MSH2, MSH3, MSH6, MUTYH, NBN, NF1, NHTL1, PALB2, PDGFRA, PMS2, POLD1, POLE, PTEN, RAD50, RAD51C, RAD51D, RNF43, SDHB, SDHC, SDHD, SMAD4, SMARCA4. STK11, TP53, TSC1, TSC2, and VHL.  The following genes were evaluated for sequence changes only: SDHA and HOXB13 c.251G>A variant only.   Based on Ms. Viereck's family history of cancer, she meets medical criteria for genetic testing. Despite that she meets criteria, she may still have an out of pocket cost.   PLAN: After considering the risks, benefits, and limitations, [name] provided informed consent to pursue genetic testing and the blood sample was sent to [facility] for analysis of the Breast Cancer STAT panel + Common Hereditary Cancers panel. Results should be available within approximately one-two weeks' time, at which point they will be disclosed by telephone to Ms. Barrientez, as will any additional recommendations warranted by these results. Ms. Tisby will receive a summary of her genetic counseling visit and a copy of her results once available. This information will also be available in Epic.   Ms. Mentel questions were answered to her satisfaction today. Our contact information was provided should additional questions or concerns arise. Thank you for the referral and allowing [place] to share in the care of your patient.   Clint Guy, MS, [facility] Certified Genetic Counselor [email or web address] Phone: [phone]  The patient was seen for a total of 25 minutes in face-to-face genetic counseling.  This patient was discussed with Drs. Magrinat, Lindi Adie and/or Burr Medico who agrees with the above.    _______________________________________________________________________ For Office Staff:  Number of people involved in session: 1 Was an Intern/ student involved with case: no

## 2019-07-26 NOTE — Progress Notes (Signed)
Radiation Oncology         ([phone]) [phone] ________________________________  Initial outpatient Consultation in person  Name: Theresa [name] MRN: [id number]  Date: 07/26/2019  DOB: [birth year]  [id number], Carloyn Manner, MD  Stark Klein, MD   REFERRING PHYSICIAN: Stark Klein, MD  DIAGNOSIS:    ICD-10-CM   1. Malignant neoplasm of lower-inner quadrant of right breast of female, estrogen receptor positive ([place])  C50.311    Z17.0      Cancer Staging Malignant neoplasm of lower-inner quadrant of right breast of female, estrogen receptor positive ([place]) Staging form: Breast, AJCC 8th Edition - Clinical stage from 07/26/2019: Stage IA (cT1c, cN0, cM0, G2, ER+, PR-, HER2-) - Unsigned   CHIEF COMPLAINT: Here to discuss management of right breast cancer  HISTORY OF PRESENT ILLNESS::Theresa [name] is a 63 y.o. female who is being seen weeks after previously scheduled due to previous positive Covid-19 testing in her husband.  She has remained healthy while caring for him.  She presented with breast abnormality on the following imaging: screening mammogram on the date of 06/14/2019.  Symptoms, if any, at that time, were: none.   Ultrasound of breast on 06/21/2019 revealed: 1.1 cm right breast mass at 3:30; 6 mm mass adjacent to 1.1 cm mass.   Biopsy on date of 07/03/2019 showed invasive and in situ mammary carcinoma in both masses, e-cadherin positive.  ER status: positive; PR status negative, Her2 status negative by FISH; Grade 2.  MRI breasts pending.  Genetic testing pending.  She rides her horse to decompress from stress.  PREVIOUS RADIATION THERAPY: No  PAST MEDICAL HISTORY:  has a past medical history of Contact lens/glasses fitting, Diarrhea, Family history of bone cancer, Family history of brain cancer, Family history of stomach cancer, Family history of uterine cancer, Fibroids, Hypertension, No pertinent past medical history, Right ovarian cyst, and Vertigo.    PAST SURGICAL HISTORY: Past  Surgical History:  Procedure Laterality Date  . BLADDER SUSPENSION    . BREAST BIOPSY  09/08/2012   Procedure: BREAST BIOPSY;  Surgeon: Odis Hollingshead, MD;  Location: [facility];  Service: General;  Laterality: Left;  remove left breast mass  . BREAST SURGERY     lumpectomy x2  . BUNIONECTOMY Right 01/2016  . CHOLECYSTECTOMY    . COLONOSCOPY    . TUBAL LIGATION      FAMILY HISTORY: family history includes Bone cancer (age of onset: 42) in her paternal grandfather; Brain cancer (age of onset: 25) in her maternal aunt; Cancer in her maternal grandmother; Dementia in her mother; Depression in her mother; Heart disease in her sister; Hypertension in her father; Stomach cancer in her father; Thyroid disease in her sister; Uterine cancer in her cousin; Uterine cancer (age of onset: 13) in her cousin; Uterine cancer (age of onset: 25) in her paternal aunt.  SOCIAL HISTORY:  reports that she quit smoking about 23 years ago. Her smoking use included cigarettes. She has never used smokeless tobacco. She reports that she does not drink alcohol or use drugs.  ALLERGIES: Flagyl [metronidazole]  MEDICATIONS:  Current Outpatient Medications  Medication Sig Dispense Refill  . BLACK COHOSH EXTRACT PO Take by mouth.    . CRANBERRY PO Take by mouth.    . Lactobacillus Rhamnosus, GG, (CULTURELLE PO) Take by mouth.    . Magnesium 100 MG TABS Take by mouth.    . Melatonin 10 MG CAPS Take by mouth.    . mupirocin ointment (BACTROBAN) 2 %  APPLY TO AFFECTED AREA EVERY DAY    . nystatin ointment (MYCOSTATIN) Apply 1 application topically 2 (two) times daily. Apply to affected area for up to 7 days. (Patient not taking: Reported on 07/26/2019) 30 g 0  . OVER THE COUNTER MEDICATION Vitamin C, B complex & quercetin    . Pumpkin Seed-Soy Germ (AZO BLADDER CONTROL/GO-LESS PO) Take by mouth.    . tamoxifen (NOLVADEX) 20 MG tablet Take 1 tablet (20 mg total) by mouth daily. 90 tablet 12  .  triamcinolone (KENALOG) 0.025 % ointment Apply 1 application topically 2 (two) times daily. Use for one week. (Patient not taking: Reported on 07/26/2019) 30 g 0  . UNABLE TO FIND Tumero XL    . valsartan (DIOVAN) 80 MG tablet Take 80 mg by mouth at bedtime.    Marland Kitchen venlafaxine XR (EFFEXOR-XR) 37.5 MG 24 hr capsule Take 1 capsule (37.5 mg total) by mouth daily with breakfast. 90 capsule 4   No current facility-administered medications for this encounter.     REVIEW OF SYSTEMS: As above   PHYSICAL EXAM:   Vitals with BMI 07/26/2019 07/06/2019 06/23/2019  Height 5' 4"  5' 4"  5' 4"   Weight 139 lbs 11 oz 139 lbs 6 oz 141 lbs 3 oz  BMI 23.97 69.62 95.28  Systolic 413 244 010  Diastolic 72 78 70  Pulse 76 76 76   General: Alert and oriented, tearful at times. Skin: No concerning lesions. Musculoskeletal: symmetric strength and muscle tone throughout. Neurologic: Cranial nerves II through XII are grossly intact. No obvious focalities. Speech is fluent. Coordination is intact. Psychiatric: Judgment and insight are intact. Affect is appropriate. Tearful at times Breasts: post biopsy firmness, 2cm, 4:00 right breast. Skin w/ biopsy bruising in this region . No other palpable masses appreciated in the breasts or axillae bilaterally  .   ECOG = 0  0 - Asymptomatic (Fully active, able to carry on all predisease activities without restriction)  1 - Symptomatic but completely ambulatory (Restricted in physically strenuous activity but ambulatory and able to carry out work of a light or sedentary nature. For example, light housework, office work)  2 - Symptomatic, <50% in bed during the day (Ambulatory and capable of all self care but unable to carry out any work activities. Up and about more than 50% of waking hours)  3 - Symptomatic, >50% in bed, but not bedbound (Capable of only limited self-care, confined to bed or chair 50% or more of waking hours)  4 - Bedbound (Completely disabled. Cannot  carry on any self-care. Totally confined to bed or chair)  5 - Death   Eustace Pen MM, Creech RH, Tormey DC, et al. [phone]). "Toxicity and response criteria of the [facility]". [place] Oncol. 5 (6): 649-55   LABORATORY DATA:  Lab Results  Component Value Date   WBC 7.9 07/26/2019   HGB 13.2 07/26/2019   HCT 39.9 07/26/2019   MCV 92.8 07/26/2019   PLT 230 07/26/2019   CMP     Component Value Date/Time   NA 140 07/26/2019 0830   K 4.3 07/26/2019 0830   CL 104 07/26/2019 0830   CO2 26 07/26/2019 0830   GLUCOSE 107 (H) 07/26/2019 0830   BUN 16 07/26/2019 0830   CREATININE 0.73 07/26/2019 0830   CALCIUM 9.5 07/26/2019 0830   PROT 7.0 07/26/2019 0830   ALBUMIN 4.4 07/26/2019 0830   AST 17 07/26/2019 0830   ALT 19 07/26/2019 0830   ALKPHOS 49  07/26/2019 0830   BILITOT 0.6 07/26/2019 0830   GFRNONAA >60 07/26/2019 0830   GFRAA >60 07/26/2019 0830         RADIOGRAPHY: US Pelvis Complete  Result Date: 07/06/2019 SEE PROGRESS NOTE  [place] Sonohysterogram  Result Date: 07/06/2019 SEE PROGRESS NOTE     IMPRESSION/PLAN: Right breast cancer   MRI of breasts  and genetic testing are pending.  She is interested in breast conserving surgery.  It was a pleasure meeting the patient today. We discussed the risks, benefits, and side effects of radiotherapy. I recommend radiotherapy to the right breast after breast conserving surgery to reduce her risk of locoregional recurrence by 2/3.  We discussed that radiation would take approximately 4 weeks to complete and that I would give the patient a few weeks to heal following surgery before starting treatment planning.  If chemotherapy were to be given, this would precede radiotherapy. We spoke about acute effects including skin irritation and fatigue as well as much less common late effects including internal organ injury or irritation. We spoke about the latest technology that is used to minimize the risk of late effects  for patients undergoing radiotherapy to the breast or chest wall. No guarantees of treatment were given. The patient is enthusiastic about proceeding with treatment. I look forward to participating in the patient's care.  I will await her referral back to me for postoperative follow-up and eventual CT simulation/treatment planning.  I wished her the best until I see her for follow-up.   __________________________________________   Eppie Gibson, MD   This document serves as a record of services personally performed by Eppie Gibson, MD. It was created on her behalf by Wilburn Mylar, a trained medical scribe. The creation of this record is based on the scribe's personal observations and the provider's statements to them. This document has been checked and approved by the attending provider.

## 2019-07-28 ENCOUNTER — Telehealth: Payer: Self-pay | Admitting: Oncology

## 2019-07-28 LAB — GENETIC SCREENING ORDER

## 2019-07-28 NOTE — Telephone Encounter (Signed)
I talk with patient regarding schedule  

## 2019-08-03 ENCOUNTER — Telehealth: Payer: Self-pay | Admitting: *Deleted

## 2019-08-03 NOTE — Telephone Encounter (Signed)
Spoke with patient from [facility] to assess navigation needs.  She is anxious about upcoming MRI and she's been reading a lot and it's making her more anxious.  Encouraged her to try to just take one thing at a time and not get overwhelmed and start jumping to conclusions.  She verbalized understanding.

## 2019-08-04 ENCOUNTER — Ambulatory Visit (HOSPITAL_COMMUNITY): Admission: RE | Admit: 2019-08-04 | Payer: Commercial Managed Care - PPO | Source: Ambulatory Visit

## 2019-08-04 DIAGNOSIS — Z1379 Encounter for other screening for genetic and chromosomal anomalies: Secondary | ICD-10-CM | POA: Insufficient documentation

## 2019-08-07 ENCOUNTER — Ambulatory Visit: Payer: Self-pay | Admitting: Genetic Counselor

## 2019-08-07 ENCOUNTER — Encounter: Payer: Self-pay | Admitting: Genetic Counselor

## 2019-08-07 ENCOUNTER — Telehealth: Payer: Self-pay | Admitting: Genetic Counselor

## 2019-08-07 ENCOUNTER — Ambulatory Visit (HOSPITAL_COMMUNITY)
Admission: RE | Admit: 2019-08-07 | Discharge: 2019-08-07 | Disposition: A | Discharge status: Home / Self Care | Payer: Commercial Managed Care - PPO | Source: Ambulatory Visit | Attending: General Surgery | Admitting: General Surgery

## 2019-08-07 ENCOUNTER — Other Ambulatory Visit: Payer: Self-pay

## 2019-08-07 DIAGNOSIS — C50311 Malignant neoplasm of lower-inner quadrant of right female breast: Secondary | ICD-10-CM

## 2019-08-07 DIAGNOSIS — Z170 Estrogen receptor positive status [ER+]: Secondary | ICD-10-CM | POA: Insufficient documentation

## 2019-08-07 DIAGNOSIS — Z1379 Encounter for other screening for genetic and chromosomal anomalies: Secondary | ICD-10-CM

## 2019-08-07 IMAGING — MR MR BREAST BILAT WO/W CM
6 of 14 series · 18 of 48 positions shown · IV contrast (Yes)
Comparison: Previous exam(s).

CLINICAL DATA: 63-year-old female with 2 sites of newly diagnosed
right breast invasive mammary carcinoma.

LABS:  None performed today and site.
EXAM:
BILATERAL BREAST MRI WITH AND WITHOUT CONTRAST
TECHNIQUE: Multiplanar, multisequence MR images of both breasts were obtained
prior to and following the intravenous administration of 6 ml of
Gadavist.

[Series 1: 3 plane loc · axial · 7.0mm · 1.56mm/px · 1 of 25 slices shown]
[im 1/25]
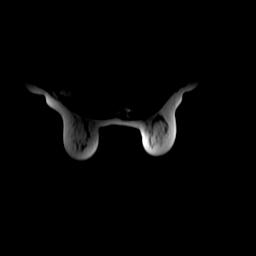

[Series 3: T1 · axial · non-contrast · 1.8mm · 0.59mm/px · z∈[-144,+75]mm · 5 of 244 slices shown]
[im 1/244]
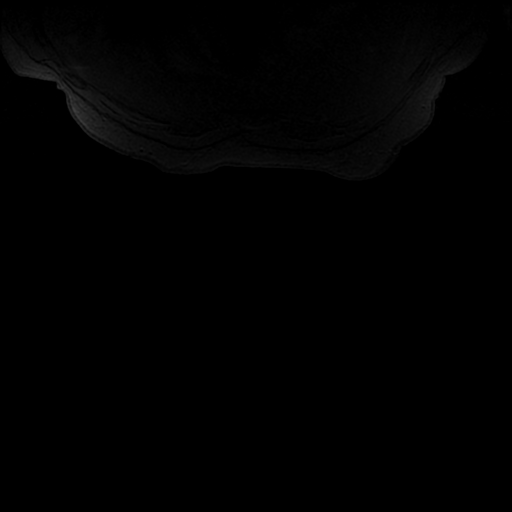
[im 61/244]
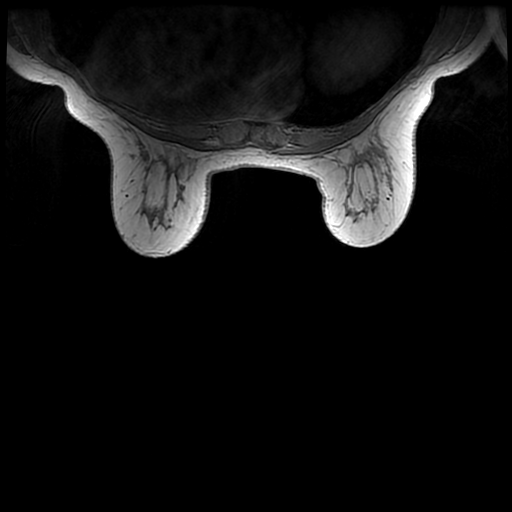
[im 122/244]
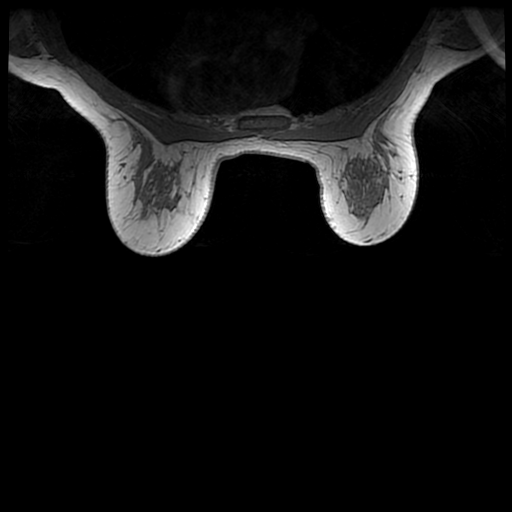
[im 183/244]
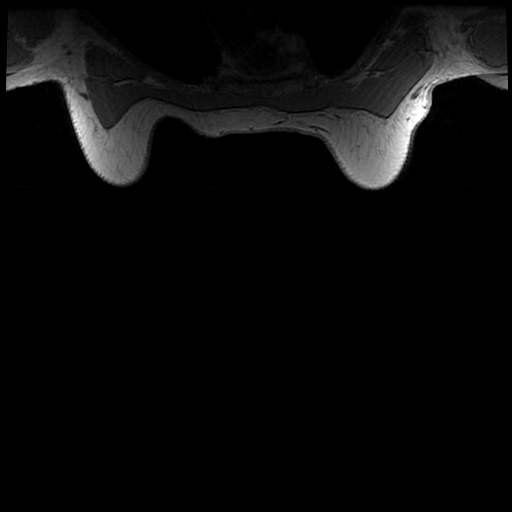
[im 244/244]
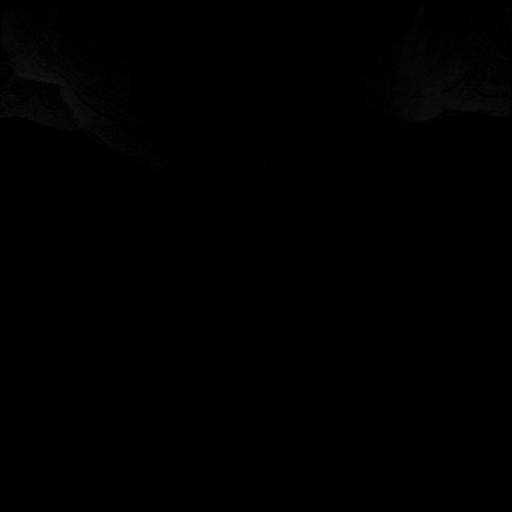

[Series 6: T2 · axial · 3.0mm · 0.59mm/px · 1 of 74 slices shown]
[im 1/74]
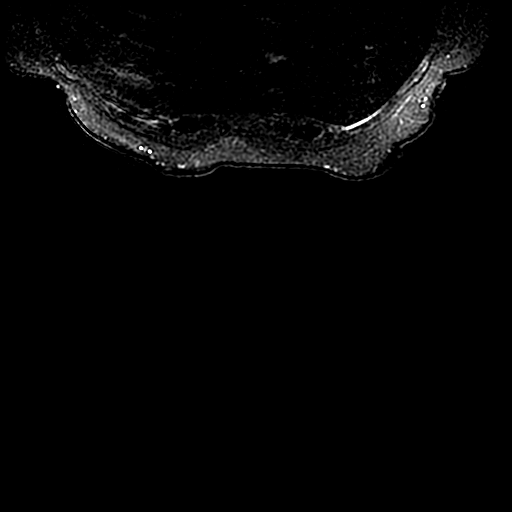

[Series 800: T1 fat-sat · axial · 1.8mm · 0.59mm/px · z∈[-144,+75]mm · 5 of 244 slices shown (1 of 3)]
[im 1/244]
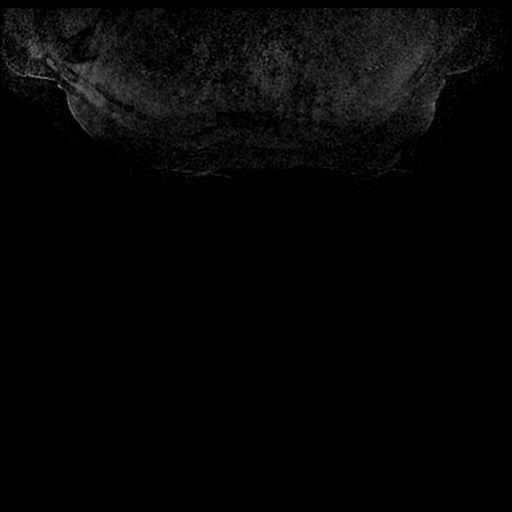
[im 61/244]
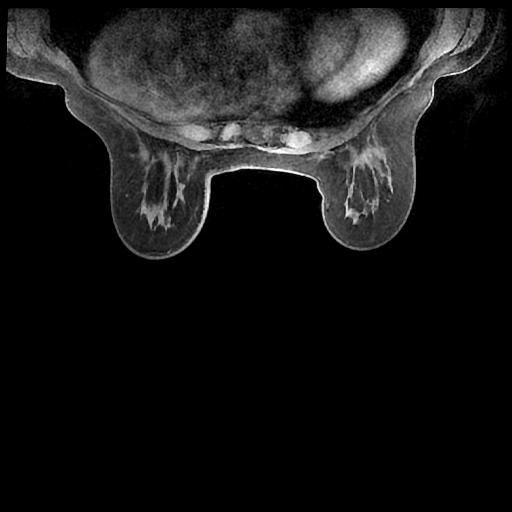
[im 122/244]
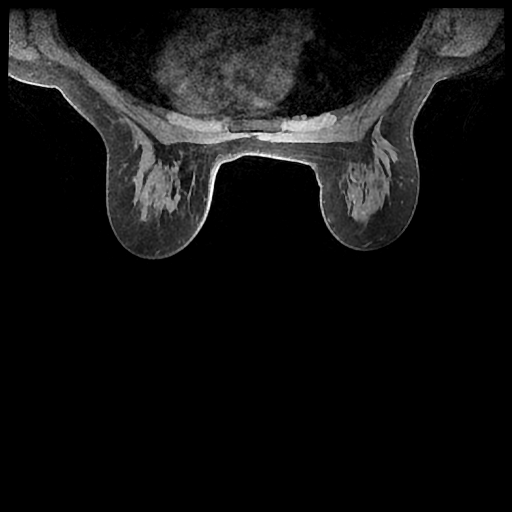
[im 183/244]
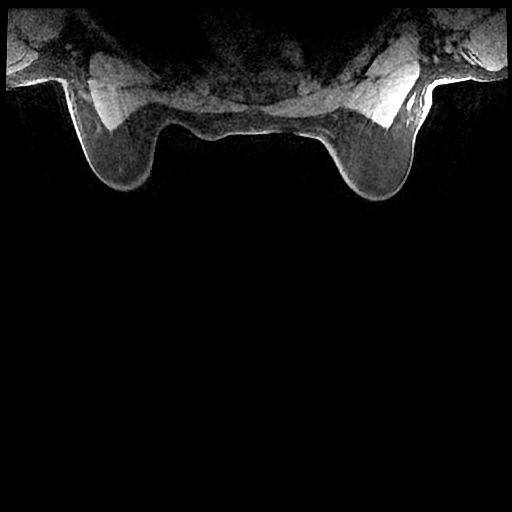
[im 244/244]
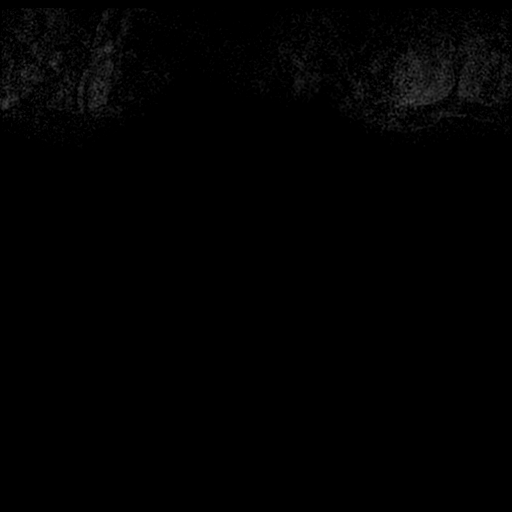

[Series 801: T1 fat-sat · axial · 1.8mm · 0.59mm/px · z∈[-144,+75]mm · 5 of 244 slices shown (2 of 3)]
[im 1/244]
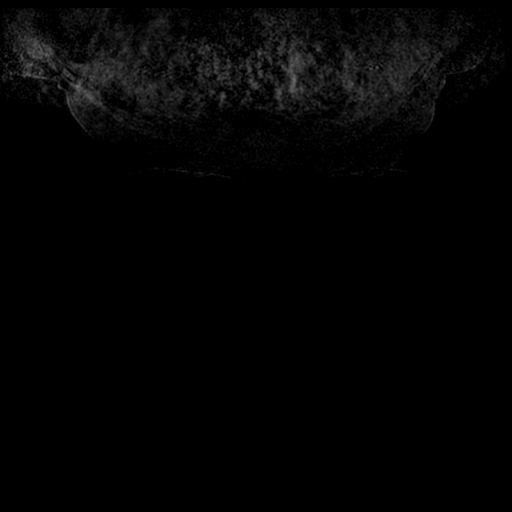
[im 61/244]
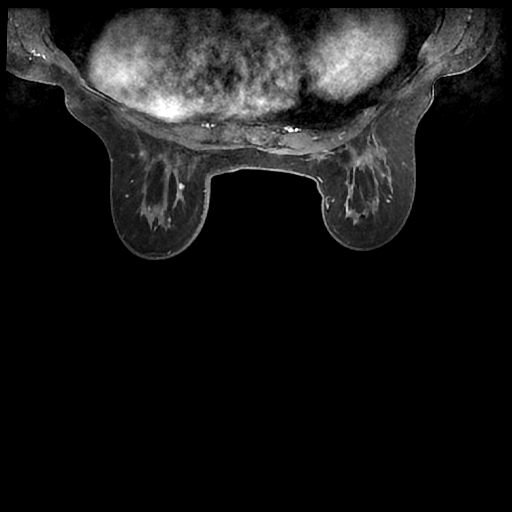
[im 122/244]
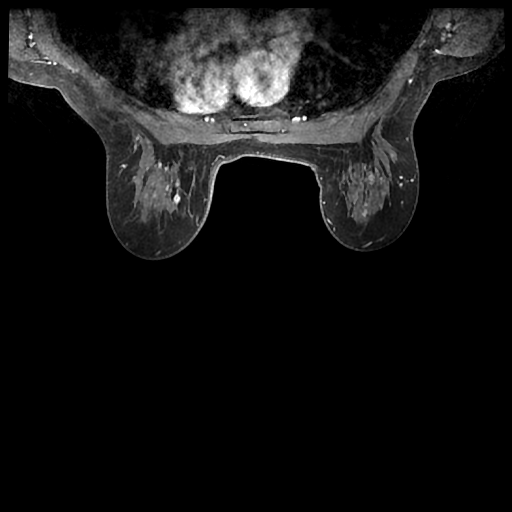
[im 183/244]
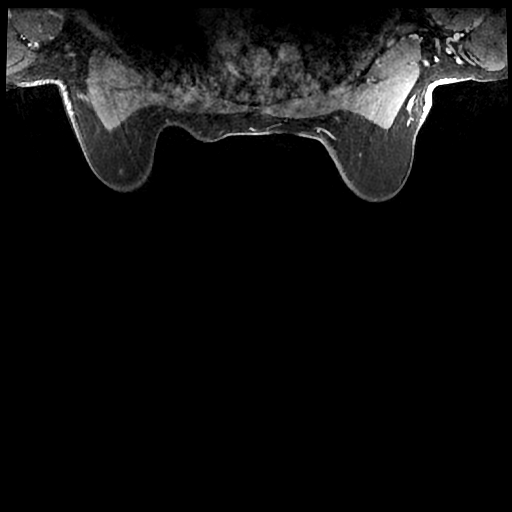
[im 244/244]
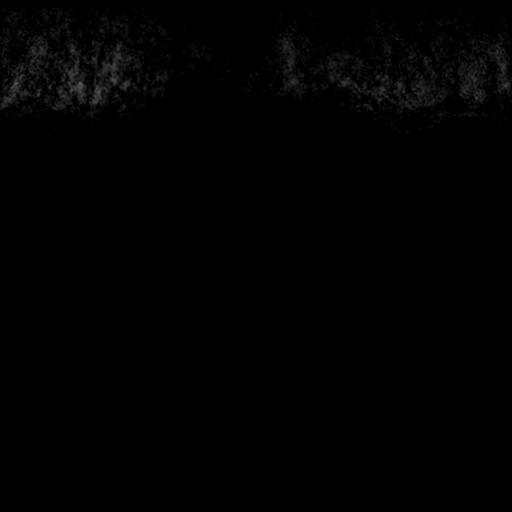

[Series 802: T1 fat-sat · axial · 1.8mm · 0.59mm/px · 1 of 244 slices shown (3 of 3)]
[im 1/244]
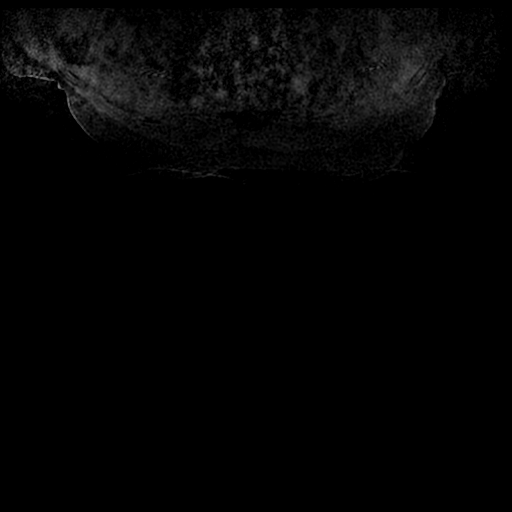

[18 of 48 positions shown; findings below may reference images not displayed]

Three-dimensional MR images were rendered by post-processing of the
original MR data on an independent workstation. The
three-dimensional MR images were interpreted, and findings are
reported in the following complete MRI report for this study. Three
dimensional images were evaluated at the independent [employer]
workstation
FINDINGS: Breast composition: c. Heterogeneous fibroglandular tissue.

Background parenchymal enhancement: Mild.

Right breast: Susceptibility artifact from 2 post biopsy clips is
demonstrated in the lower inner right breast at posterior depth
(series [K8], image 169/244). These are consistent with the
patient's 2 sites of biopsy-proven malignancy. There is an
irregular, enhancing mass in association with the 2 post biopsy
clips with areas of surrounding non mass enhancement. Overall, the
entire enhancing conglomerate measures approximately 3.1 x 1.7 x
cm (AP by transverse by craniocaudal dimensions). The non mass
enhancement extends approximately 2 cm anterior to the anterior-most
biopsy clip.

An additional irregular, enhancing mass is identified in the
superior central aspect at far posterior depth (series [K8], image
130/244). It measures 6 x 5 x 7 mm.

Left breast: An enhancing mass is identified in the upper inner
quadrant at middle depth (series [K8], image 122/244). It measures
6 x 5 x 5 mm. No other mass or abnormal enhancement.

Lymph nodes: No abnormal appearing lymph nodes.

Ancillary findings:  None.
IMPRESSION: 1. Irregular, enhancing mass in the lower inner quadrant of the
right breast consistent with the patient's biopsy-proven sites of
malignancy. There is surrounding non mass enhancement, with the
entire conglomerate measuring approximately 3.1 x 1.7 x 2.7 cm. Non
mass enhancement extends approximately 2 cm anterior to the
anterior-most biopsy clip. Recommendation is for additional MRI
biopsy of the anterior extent of non mass enhancement if breast
conservation therapy is a consideration.
2. Suspicious, 7 mm enhancing mass in the superior central right
breast at far posterior depth. Recommendation is for MRI guided
biopsy if breast conservation therapy is a consideration.
3. Indeterminate 6 mm enhancing mass in the upper inner left breast
at middle depth. Recommendation is for MRI guided biopsy.
4. No suspicious lymphadenopathy.

RECOMMENDATION:
1. Two area MRI guided biopsy of the right breast if breast
conservation therapy is a consideration.
2. Single area MRI guided biopsy of the left breast.

BI-RADS CATEGORY  4: Suspicious.

## 2019-08-07 MED ORDER — GADOBUTROL 1 MMOL/ML IV SOLN
6.0000 mL | Freq: Once | INTRAVENOUS | Status: AC | PRN
  Administered 2019-08-07: 6 mL via INTRAVENOUS

## 2019-08-07 NOTE — Progress Notes (Signed)
Please let patient know we will need to get several additional MRI biopsies.  Then order 2 MR guided right breast biopsies and one left breast MR guided biopsy.

## 2019-08-07 NOTE — Telephone Encounter (Signed)
Revealed negative genetic testing.  Discussed that we do not know why she has breast cancer or why there is cancer in the family.  It could be due to a different gene that we are not testing, or our current technology may not be able detect something.  It is also possible that there is a hereditary cause for the colon and uterine cancer on her father's side of the family that she did not inherit.  It will be a good idea for her to keep in contact with genetics to keep up with whether additional testing may be appropriate in the future.

## 2019-08-07 NOTE — Telephone Encounter (Signed)
LVM that her genetic test results are available and requested that she call back to discuss them.  

## 2019-08-07 NOTE — Progress Notes (Signed)
HPI:  [name] was previously seen in the [place] clinic due to a personal history of breast cancer as well as a family history of colon and uterine cancer, and concerns regarding a hereditary predisposition to cancer. Please refer to our prior cancer genetics clinic note for more information regarding our discussion, assessment and recommendations, at the time. [name] recent genetic test results were disclosed to her, as were recommendations warranted by these results. These results and recommendations are discussed in more detail below.  CANCER HISTORY:  Oncology History  Malignant neoplasm of lower-inner quadrant of right breast of female, estrogen receptor positive ([place])  07/06/2019 Initial Diagnosis   Malignant neoplasm of lower-inner quadrant of right breast of female, estrogen receptor positive ([place])   08/04/2019 Genetic Testing   Negative genetic testing:  No pathogenic variants detected on the [facility] Breast Cancer STAT panel or the Common Hereditary Cancers panel. The report date is 08/04/2019.  The STAT Breast cancer panel offered by [facility] includes sequencing and rearrangement analysis for the following 9 genes:  ATM, BRCA1, BRCA2, CDH1, CHEK2, PALB2, PTEN, STK11 and TP53.  The Common Hereditary Cancers Panel offered by [facility] includes sequencing and/or deletion duplication testing of the following 48 genes: APC, ATM, AXIN2, BARD1, BMPR1A, BRCA1, BRCA2, BRIP1, CDH1, CDK4, CDKN2A (p14ARF), CDKN2A (p16INK4a), CHEK2, CTNNA1, DICER1, EPCAM (Deletion/duplication testing only), GREM1 (promoter region deletion/duplication testing only), KIT, MEN1, MLH1, MSH2, MSH3, MSH6, MUTYH, NBN, NF1, NHTL1, PALB2, PDGFRA, PMS2, POLD1, POLE, PTEN, RAD50, RAD51C, RAD51D, RNF43, SDHB, SDHC, SDHD, SMAD4, SMARCA4. STK11, TP53, TSC1, TSC2, and VHL.  The following genes were evaluated for sequence changes only: SDHA and HOXB13 c.251G>A variant only.      FAMILY HISTORY:  We obtained a  detailed, 4-generation family history.  Significant diagnoses are listed below: Family History  Problem Relation Age of Onset  . Hypertension Father   . Stomach cancer Father        may have been colon, diagnosed in his 16s  . Depression Mother   . Dementia Mother   . Thyroid disease Sister   . Heart disease Sister   . Brain cancer Maternal Aunt 80  . Cancer Maternal Grandmother        undetermined type, diagnosed in her 62s  . Bone cancer Paternal Grandfather 59  . Uterine cancer Paternal Aunt 23  . Uterine cancer Cousin        paternal 1st cousin, diagnosed in her late 49s  . Uterine cancer Cousin 62       paternal 1st cousin    [name] has one son who is currently 59. She has two sisters, one of whom passed away at the age of 66. None of these individuals have had cancer.  [name] mother died at the age of 28 and did not have cancer. She had one maternal aunt who died from brain cancer in her 6s. Her maternal grandmother died of an unspecified type of cancer at age 71, and her grandfather died in his 60s from pneumonia. She does not know if any cousins on the maternal side of the family have had cancer.  [name] father is deceased and had a history of either stomach cancer or colon cancer diagnosed in his 63s. She had five paternal uncles and five paternal aunts. One of her aunts had uterine cancer diagnosed when she was 54. She also has two paternal first cousins who have had uterine cancer - one diagnosed in her late 64s and  one diagnosed around age 58. Her paternal grandmother died at age 39, and her paternal grandfather died at age 88 from bone cancer.  [name] is unaware of previous family history of genetic testing for hereditary cancer risks.   GENETIC TEST RESULTS: Genetic testing reported out on 08/04/2019 through the [facility] Breast Cancer STAT panel + Common Hereditary Cancers panel, which detected no pathogenic variants.  The STAT Breast cancer panel  offered by [facility] includes sequencing and rearrangement analysis for the following 9 genes:  ATM, BRCA1, BRCA2, CDH1, CHEK2, PALB2, PTEN, STK11 and TP53.  The Common Hereditary Cancers Panel offered by [facility] includes sequencing and/or deletion duplication testing of the following 48 genes: APC, ATM, AXIN2, BARD1, BMPR1A, BRCA1, BRCA2, BRIP1, CDH1, CDK4, CDKN2A (p14ARF), CDKN2A (p16INK4a), CHEK2, CTNNA1, DICER1, EPCAM (Deletion/duplication testing only), GREM1 (promoter region deletion/duplication testing only), KIT, MEN1, MLH1, MSH2, MSH3, MSH6, MUTYH, NBN, NF1, NHTL1, PALB2, PDGFRA, PMS2, POLD1, POLE, PTEN, RAD50, RAD51C, RAD51D, RNF43, SDHB, SDHC, SDHD, SMAD4, SMARCA4. STK11, TP53, TSC1, TSC2, and VHL.  The following genes were evaluated for sequence changes only: SDHA and HOXB13 c.251G>A variant only. The test report will be scanned into EPIC and located under the Molecular Pathology section of the Results Review tab.  A portion of the result report is included below for reference.     We discussed with [name] that because current genetic testing is not perfect, it is possible there may be a gene mutation in one of these genes that current testing cannot detect, but that chance is small.  We also discussed, that there could be another gene that has not yet been discovered, or that we have not yet tested, that is responsible for the cancer diagnoses in the family. It is also possible there is a hereditary cause for the cancer in the family that [name] did not inherit and therefore was not identified in her testing.  Therefore, it is important to remain in touch with cancer genetics in the future so that we can continue to offer [name] the most up to date genetic testing.   CANCER SCREENING RECOMMENDATIONS: [name] test result is considered negative (normal).  This means that we have not identified a hereditary cause for her personal and family history of cancer at this time. Most cancers  happen by chance and this negative test suggests that her personal and family of cancer may fall into this category.    While reassuring, this does not definitively rule out a hereditary predisposition to cancer. It is still possible that there could be genetic mutations that are undetectable by current technology. There could be genetic mutations in genes that have not been tested or identified to increase cancer risk.  Therefore, it is recommended she continue to follow the cancer management and screening guidelines provided by her oncology and primary healthcare provider.   An individual's cancer risk and medical management are not determined by genetic test results alone. Overall cancer risk assessment incorporates additional factors, including personal medical history, family history, and any available genetic information that may result in a personalized plan for cancer prevention and surveillance.  RECOMMENDATIONS FOR FAMILY MEMBERS:  Individuals in this family might be at some increased risk of developing cancer, over the general population risk, simply due to the family history of cancer.  We recommended women in this family have a yearly mammogram beginning at age 46, or 60 years younger than the earliest onset of cancer, an annual clinical breast exam, and perform  monthly breast self-exams. Women in this family should also have a gynecological exam as recommended by their primary provider. All family members should have a colonoscopy by age 25.  FOLLOW-UP: Lastly, we discussed with [name] that cancer genetics is a rapidly advancing field and it is possible that new genetic tests will be appropriate for her and/or her family members in the future. We encouraged her to remain in contact with cancer genetics on an annual basis so we can update her personal and family histories and let her know of advances in cancer genetics that may benefit this family.   Our contact number was provided. Ms.  Theresa [name] questions were answered to her satisfaction, and she knows she is welcome to call us at anytime with additional questions or concerns.   Clint Guy, MS, [facility] Certified Genetic Counselor [email or web address] Phone: [phone]

## 2019-08-08 ENCOUNTER — Telehealth: Payer: Self-pay | Admitting: *Deleted

## 2019-08-08 NOTE — Telephone Encounter (Signed)
Received call from patient in a panic about a call she received yesterday stating she needs additional biopsies.  Discussed the reasons needed for additional biopsies and patient felt better after explanation and she now understands.  Encouraged her to call with any other questions or concerns.

## 2019-08-09 ENCOUNTER — Other Ambulatory Visit: Payer: Self-pay | Admitting: General Surgery

## 2019-08-09 ENCOUNTER — Telehealth: Payer: Self-pay

## 2019-08-09 DIAGNOSIS — R9389 Abnormal findings on diagnostic imaging of other specified body structures: Secondary | ICD-10-CM

## 2019-08-09 NOTE — Telephone Encounter (Signed)
Nutrition Assessment  Reason for Assessment:  Pt attended Breast Clinic on 07/26/2019 and was given by nurse navigator.   ASSESSMENT:   63 year old female with new diagnosis of breast cancer.  Patient has undergone MRI and needing more biopsies at this time.  Surgery to be determined and oncotype, followed by radiation and antiestrogens.    Spoke with patient via phone this am to introduce self and service at [facility].  Patient reports that she is feeling overwhelmed with everything going on.   Medications:  reviewed  Labs: reviewed  Anthropometrics:   Height: 64 inches Weight: 139 lb 11.2 oz BMI: 23   NUTRITION DIAGNOSIS: Food and nutrition related knowledge deficit related to new diagnosis of breast cancer as evidenced by no prior need for nutrition related information.  INTERVENTION:  Offered support services of [place] and Education officer, museum to patient and she declined at this time.  Discussed and provided packet of information regarding nutritional tips for breast cancer patients.  No questions at this time. Contact information provided and patient knows to contact me with questions/concerns.    MONITORING, EVALUATION, and GOAL: Pt will consume a healthy plant based diet to maintain lean body mass throughout treatment.   Theresa [name] B. Theresa [name], [place], [place] Registered Dietitian [phone] (pager)

## 2019-08-21 ENCOUNTER — Ambulatory Visit
Admission: RE | Admit: 2019-08-21 | Discharge: 2019-08-21 | Disposition: A | Discharge status: Home / Self Care | Payer: Commercial Managed Care - PPO | Source: Ambulatory Visit | Attending: General Surgery | Admitting: General Surgery

## 2019-08-21 ENCOUNTER — Other Ambulatory Visit: Payer: Self-pay

## 2019-08-21 DIAGNOSIS — R9389 Abnormal findings on diagnostic imaging of other specified body structures: Secondary | ICD-10-CM

## 2019-08-21 IMAGING — MR MR BREAST BX W LOC DEV 1ST LESION IMAGE BX SPEC MR GUIDE*L*
7 of 10 series · 29 of 48 positions shown · IV contrast (6 ml Gadavist)
Comparison: Previous exams.
COMPARISON: Previous exams.

Addendum:
CLINICAL DATA: 63-year-old female with recently diagnosed right
breast cancer presents for MRI guided biopsy of an enhancing mass in
the upper inner quadrant of the left breast, an enhancing mass in
the central posterior right breast and linear non mass enhancement
extending anteriorly from the dominant mass in the lower inner right
breast.

EXAM:
MRI GUIDED CORE NEEDLE BIOPSY OF THE BILATERAL BREAST
TECHNIQUE: Multiplanar, multisequence MR imaging of the bilateral breasts was
performed both before and after administration of intravenous
contrast.
CONTRAST:  6 mL Gadavist.

[Series 3: fiducial bilateral · sagittal · 2.0mm · 1.33mm/px · 4 of 144 slices shown]
[im 1/144]
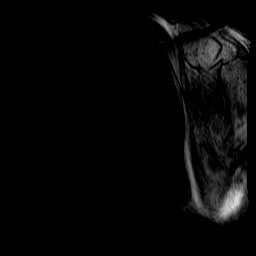
[im 48/144]
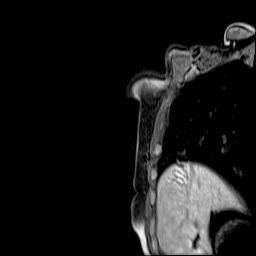
[im 96/144]
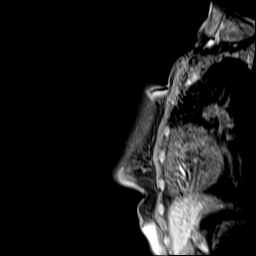
[im 144/144]
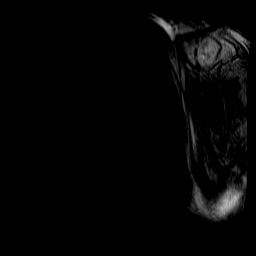

[Series 5: dynamic pre · axial · non-contrast · 1.3mm · 0.73mm/px · z∈[-78,+108]mm · 4 of 144 slices shown]
[im 1/144]
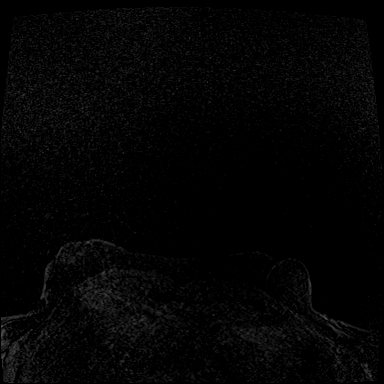
[im 48/144]
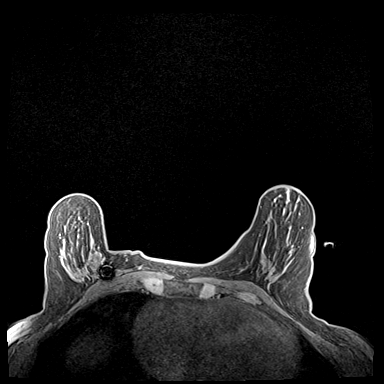
[im 96/144]
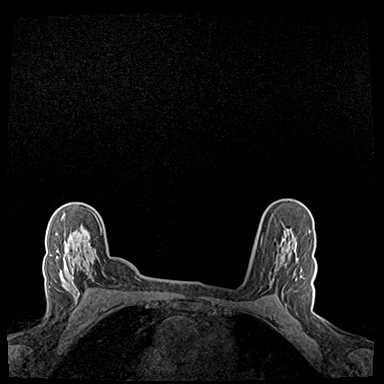
[im 144/144]
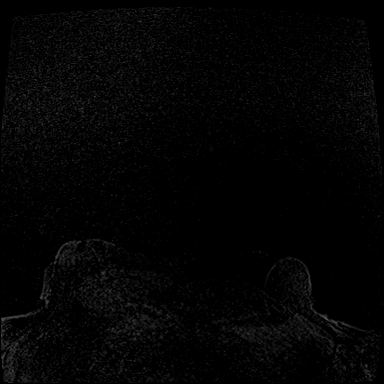

[Series 6: dynamic post 20 · axial · 1.3mm · 0.73mm/px · z∈[-78,+108]mm · 5 of 144 slices shown (1 of 2)]
[im 1/144]
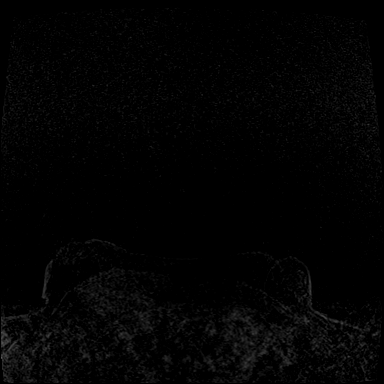
[im 36/144]
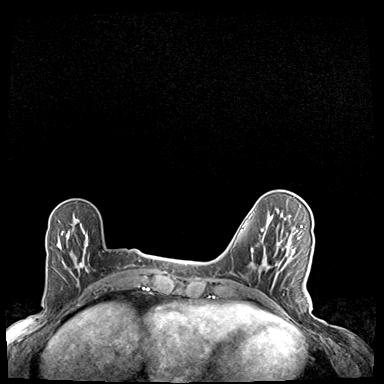
[im 72/144]
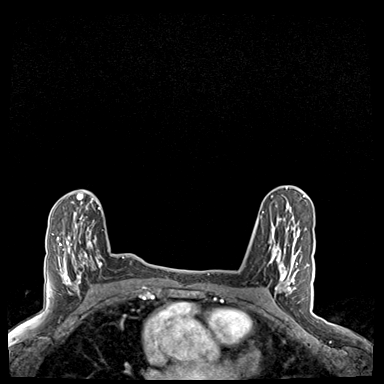
[im 108/144]
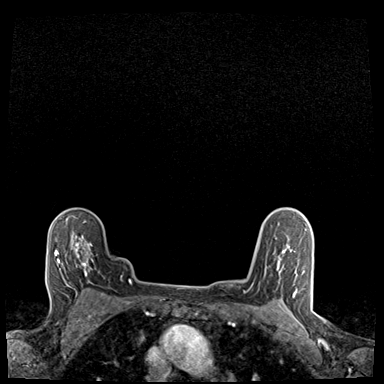
[im 144/144]
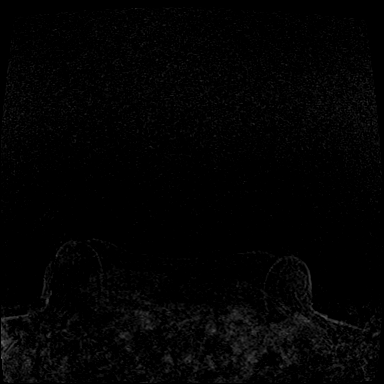

[Series 7: dynamic post 20 · axial · 1.3mm · 0.73mm/px · z∈[-78,+108]mm · 5 of 144 slices shown (2 of 2)]
[im 1/144]
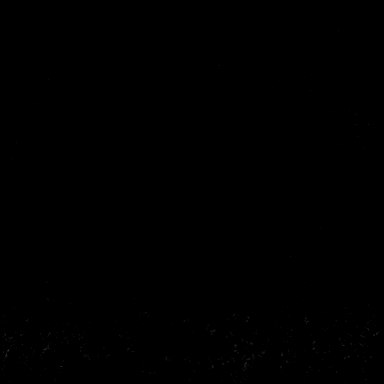
[im 36/144]
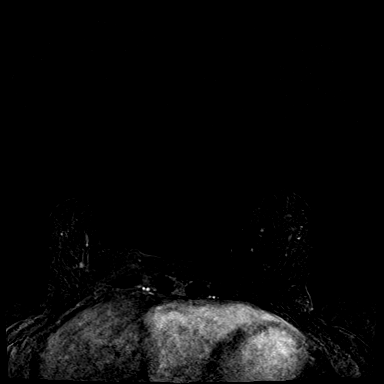
[im 72/144]
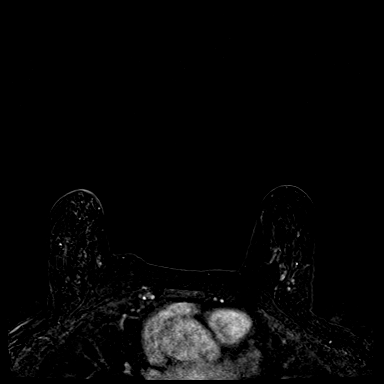
[im 108/144]
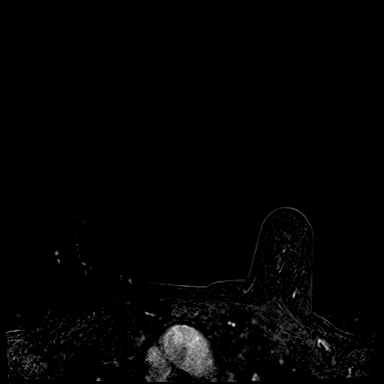
[im 144/144]
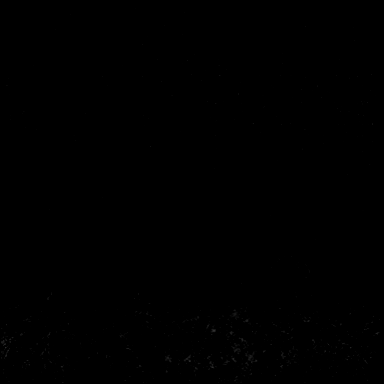

[Series 8: dynamic post 3 · axial · 1.3mm · 0.73mm/px · z∈[-78,+108]mm · 5 of 144 slices shown (1 of 2)]
[im 1/144]
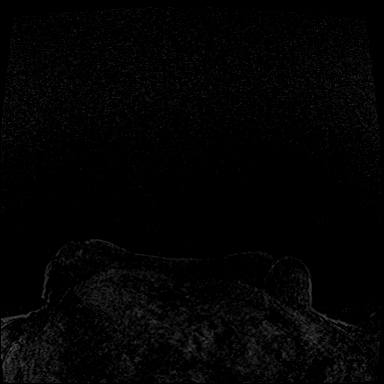
[im 36/144]
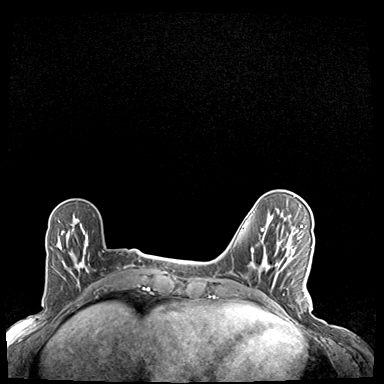
[im 72/144]
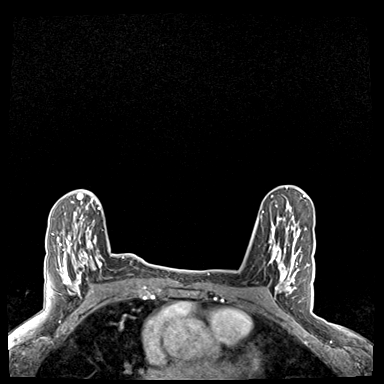
[im 108/144]
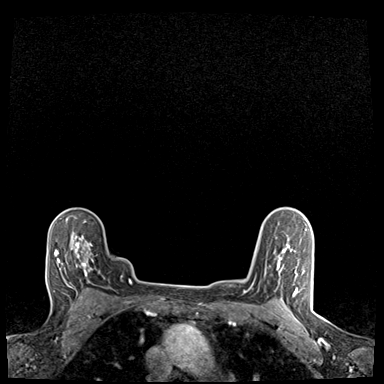
[im 144/144]
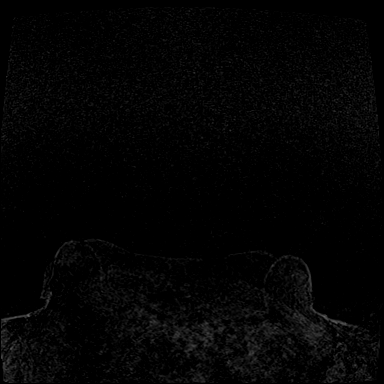

[Series 9: dynamic post 3 · axial · 1.3mm · 0.73mm/px · z∈[-78,+108]mm · 5 of 144 slices shown (2 of 2)]
[im 1/144]
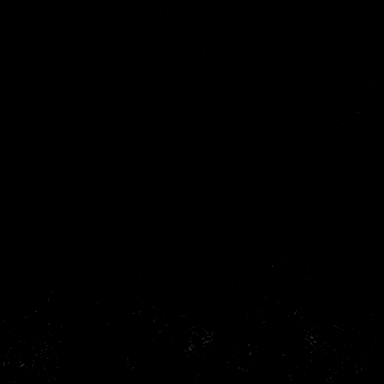
[im 36/144]
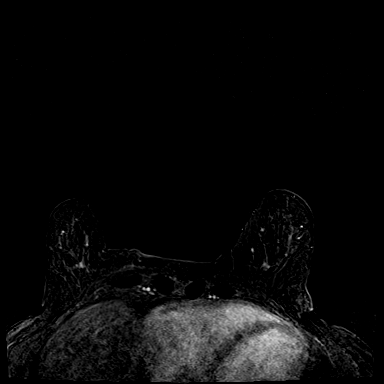
[im 72/144]
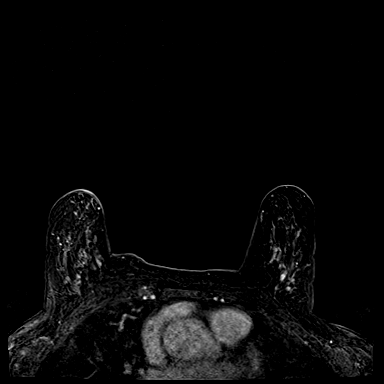
[im 108/144]
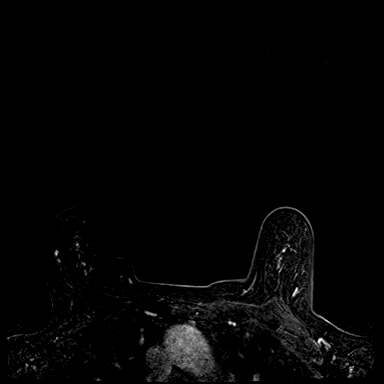
[im 144/144]
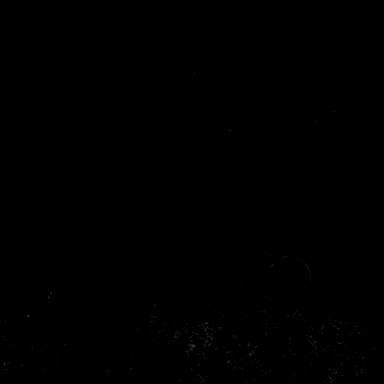

[Series 10: needle confirmation · axial · 1.3mm · 0.73mm/px · 1 of 144 slices shown]
[im 1/144]
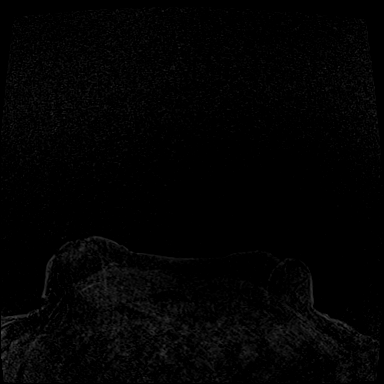

[29 of 48 positions shown; findings below may reference images not displayed]

FINDINGS: I met with the patient, and we discussed the procedure of MRI guided
biopsy, including risks, benefits, and alternatives. Specifically,
we discussed the risks of infection, bleeding, tissue injury, clip
migration, and inadequate sampling. Informed, written consent was
given. The usual time out protocol was performed immediately prior
to the procedure.

SITE 1: LEFT BREAST UPPER INNER QUADRANT: ENHANCING MASS: Using
sterile technique, 1% Lidocaine, MRI guidance, and a 9 gauge vacuum
assisted device, biopsy was performed of the enhancing mass in the
upper inner quadrant of the left breast using a lateral to medial
approach. At the conclusion of the procedure, a dumbbell shaped
tissue marker clip was deployed into the biopsy cavity. Follow-up
2-view mammogram was performed and dictated separately.

SITE 2: RIGHT BREAST UPPER CENTRAL POSTERIOR: ENHANCING MASS: Using
sterile technique, 1% Lidocaine, MRI guidance, and a 9 gauge vacuum
assisted device, biopsy was performed of enhancing mass in the upper
central posterior right breast using a lateral to medial approach.
At the conclusion of the procedure, a dumbbell shaped tissue marker
clip was deployed into the biopsy cavity. Follow-up 2-view mammogram
was performed and dictated separately.

SITE 3: RIGHT BREAST LOWER INNER NON MASS ENHANCEMENT: Using sterile
technique, 1% Lidocaine, MRI guidance, and a 9 gauge vacuum assisted
device, biopsy was performed of the non mass enhancement in the
lower inner right breast extending anteriorly from the dominant mass
using a lateral to medial approach. At the conclusion of the
procedure, a cylindrical shaped tissue marker clip was deployed into
the biopsy cavity. Follow-up 2-view mammogram was performed and
dictated separately.
IMPRESSION: 1. MRI guided biopsy of the enhancing mass in the upper inner
quadrant of the left breast, at site of dumbbell shaped biopsy
marking clip.

2. MRI guided biopsy of the enhancing mass in the upper central
posterior right breast, at site of dumbbell shaped biopsy marking
clip.

3. MRI guided biopsy of the non mass enhancement in the lower inner
quadrant of the right mass, at site of cylindrical shaped biopsy
marking clip.

ADDENDUM:
Pathology revealed FIBROCYSTIC CHANGES WITH USUAL DUCTAL HYPERPLASIA
AND CALCIFICATIONS of the LEFT breast, upper inner quadrant. This
was found to be concordant by Dr. LUECK.

Pathology revealed FIBROCYSTIC CHANGES WITH USUAL DUCTAL HYPERPLASIA
AND CALCIFICATIONS. FIBROADENOMA of the RIGHT breast, upper central
posterior. This was found to be concordant by Dr. LUECK.

Pathology revealed FIBROCYSTIC CHANGES of the RIGHT breast, lower
inner non mass enhancement. This was found to be discordant by Dr.
LUECK, with excision recommended.

Pathology results were discussed with the patient by telephone. The
patient reported doing well after the biopsies with tenderness at
the sites. Post biopsy instructions and care were reviewed and
questions were answered. The patient was encouraged to call The

Dr. LUECK Nurse LUECK was made aware of these results. She will
communicate Dr. LUECK instructions with patient.

Pathology results reported by LUECK, RN on [DATE].

*** End of Addendum ***
FINDINGS: I met with the patient, and we discussed the procedure of MRI guided
biopsy, including risks, benefits, and alternatives. Specifically,
we discussed the risks of infection, bleeding, tissue injury, clip
migration, and inadequate sampling. Informed, written consent was
given. The usual time out protocol was performed immediately prior
to the procedure.

SITE 1: LEFT BREAST UPPER INNER QUADRANT: ENHANCING MASS: Using
sterile technique, 1% Lidocaine, MRI guidance, and a 9 gauge vacuum
assisted device, biopsy was performed of the enhancing mass in the
upper inner quadrant of the left breast using a lateral to medial
approach. At the conclusion of the procedure, a dumbbell shaped
tissue marker clip was deployed into the biopsy cavity. Follow-up
2-view mammogram was performed and dictated separately.

SITE 2: RIGHT BREAST UPPER CENTRAL POSTERIOR: ENHANCING MASS: Using
sterile technique, 1% Lidocaine, MRI guidance, and a 9 gauge vacuum
assisted device, biopsy was performed of enhancing mass in the upper
central posterior right breast using a lateral to medial approach.
At the conclusion of the procedure, a dumbbell shaped tissue marker
clip was deployed into the biopsy cavity. Follow-up 2-view mammogram
was performed and dictated separately.

SITE 3: RIGHT BREAST LOWER INNER NON MASS ENHANCEMENT: Using sterile
technique, 1% Lidocaine, MRI guidance, and a 9 gauge vacuum assisted
device, biopsy was performed of the non mass enhancement in the
lower inner right breast extending anteriorly from the dominant mass
using a lateral to medial approach. At the conclusion of the
procedure, a cylindrical shaped tissue marker clip was deployed into
the biopsy cavity. Follow-up 2-view mammogram was performed and
dictated separately.
IMPRESSION: 1. MRI guided biopsy of the enhancing mass in the upper inner
quadrant of the left breast, at site of dumbbell shaped biopsy
marking clip.

2. MRI guided biopsy of the enhancing mass in the upper central
posterior right breast, at site of dumbbell shaped biopsy marking
clip.

3. MRI guided biopsy of the non mass enhancement in the lower inner
quadrant of the right mass, at site of cylindrical shaped biopsy
marking clip.

## 2019-08-21 IMAGING — MR MR BREAST BX W/ LOC DEV EA ADD LESION IMAGE BX SPEC MR GUIDE*R*
7 of 10 series · 29 of 48 positions shown · IV contrast (6 ml Gadavist)
Comparison: Previous exams.
COMPARISON: Previous exams.

Addendum:
CLINICAL DATA: 63-year-old female with recently diagnosed right
breast cancer presents for MRI guided biopsy of an enhancing mass in
the upper inner quadrant of the left breast, an enhancing mass in
the central posterior right breast and linear non mass enhancement
extending anteriorly from the dominant mass in the lower inner right
breast.

EXAM:
MRI GUIDED CORE NEEDLE BIOPSY OF THE BILATERAL BREAST
TECHNIQUE: Multiplanar, multisequence MR imaging of the bilateral breasts was
performed both before and after administration of intravenous
contrast.
CONTRAST:  6 mL Gadavist.

[Series 3: fiducial bilateral · sagittal · 2.0mm · 1.33mm/px · 4 of 144 slices shown]
[im 1/144]
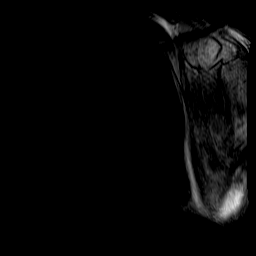
[im 48/144]
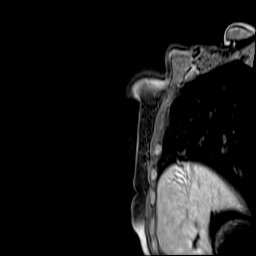
[im 96/144]
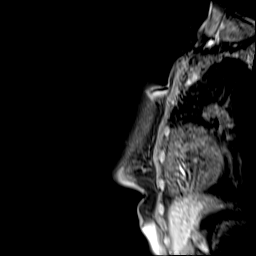
[im 144/144]
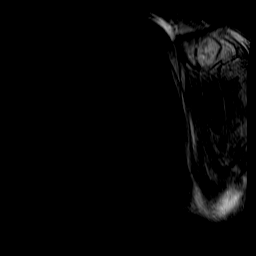

[Series 4: dynamic pre · axial · non-contrast · 1.3mm · 0.73mm/px · z∈[-78,+108]mm · 4 of 144 slices shown (1 of 2)]
[im 1/144]
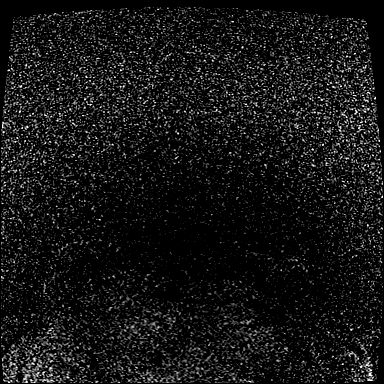
[im 48/144]
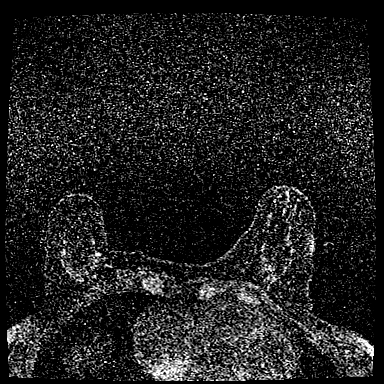
[im 96/144]
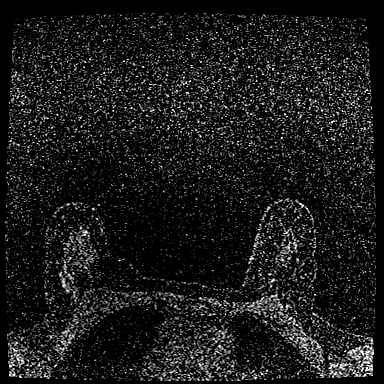
[im 144/144]
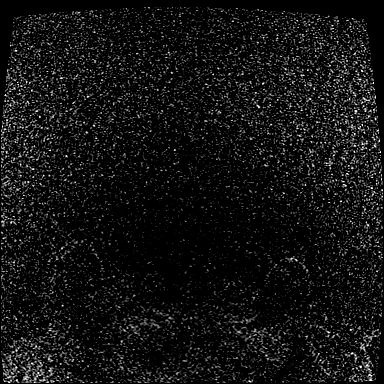

[Series 5: dynamic pre · axial · non-contrast · 1.3mm · 0.73mm/px · z∈[-78,+108]mm · 5 of 144 slices shown (2 of 2)]
[im 1/144]
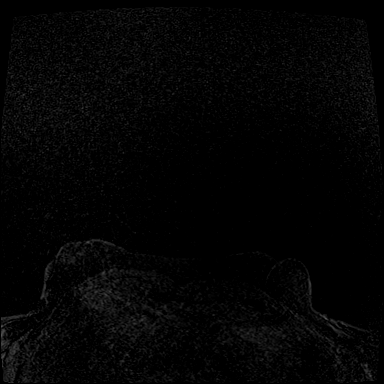
[im 36/144]
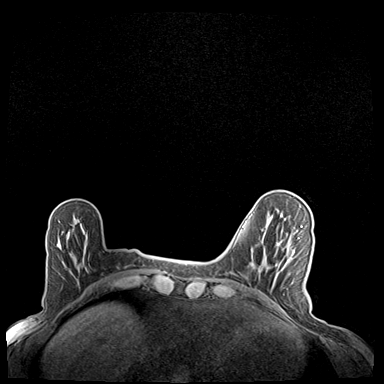
[im 72/144]
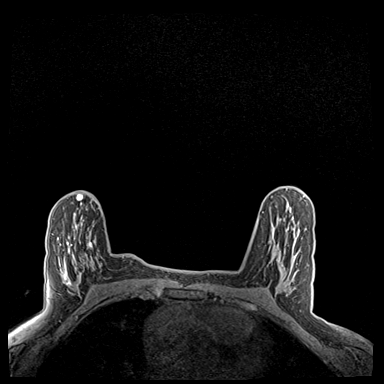
[im 108/144]
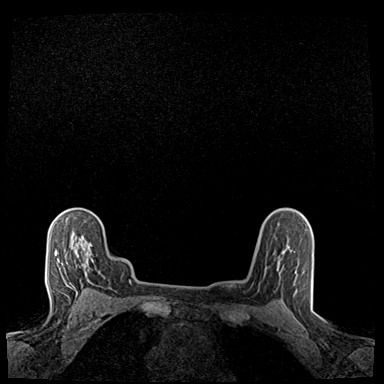
[im 144/144]
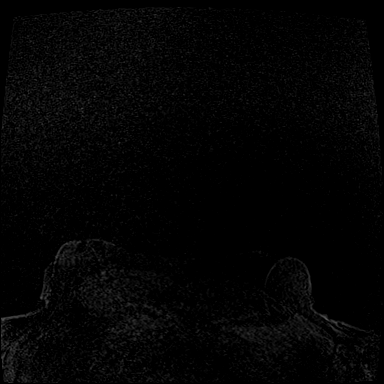

[Series 6: dynamic post 20 · axial · 1.3mm · 0.73mm/px · z∈[-78,+108]mm · 5 of 144 slices shown (1 of 2)]
[im 1/144]
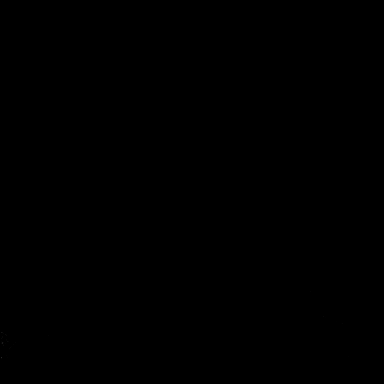
[im 36/144]
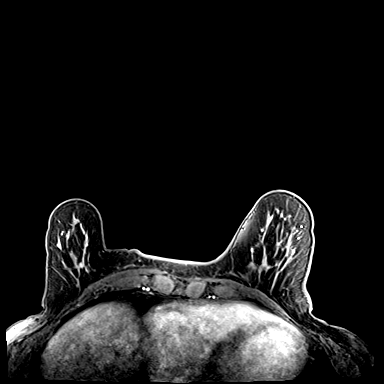
[im 72/144]
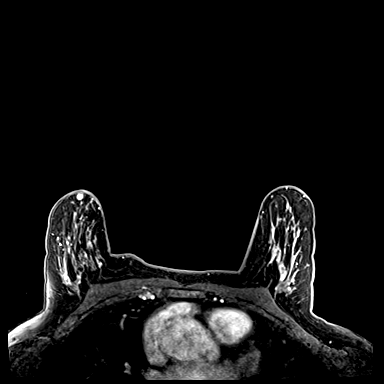
[im 108/144]
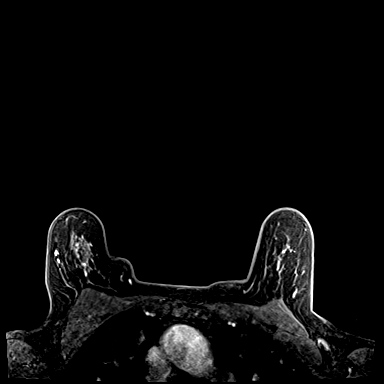
[im 144/144]
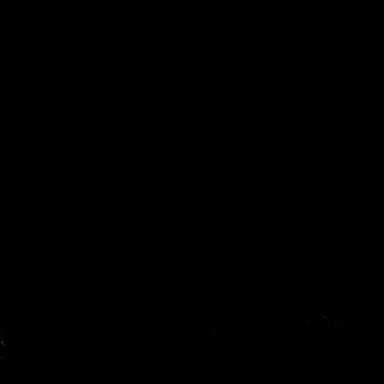

[Series 7: dynamic post 20 · axial · 1.3mm · 0.73mm/px · z∈[-78,+108]mm · 5 of 144 slices shown (2 of 2)]
[im 1/144]
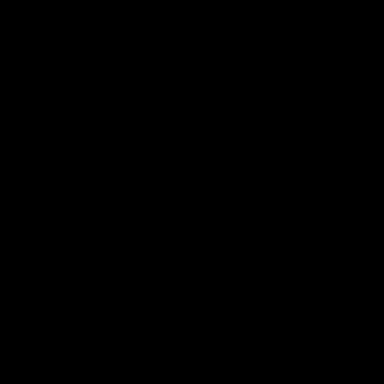
[im 36/144]
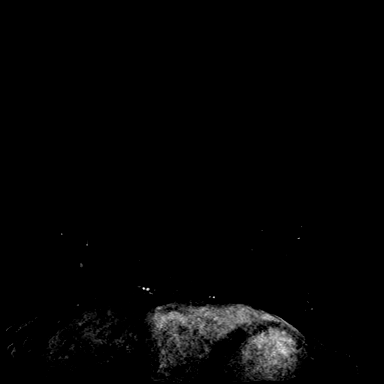
[im 72/144]
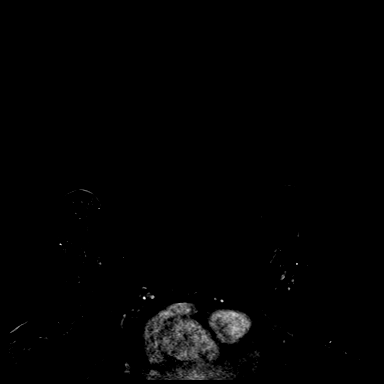
[im 108/144]
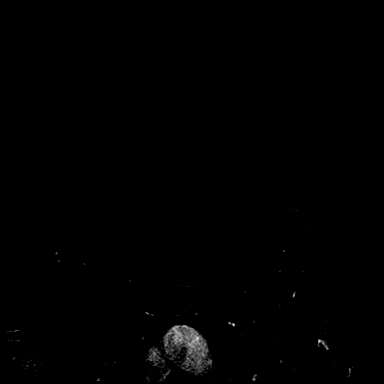
[im 144/144]
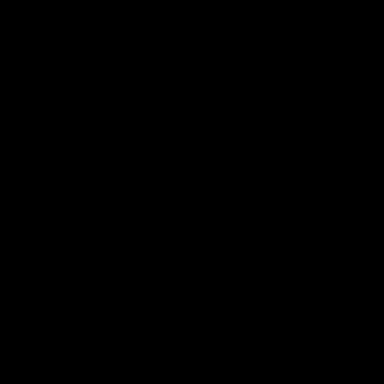

[Series 8: dynamic post 3 · axial · 1.3mm · 0.73mm/px · z∈[-78,+108]mm · 5 of 144 slices shown (1 of 2)]
[im 1/144]
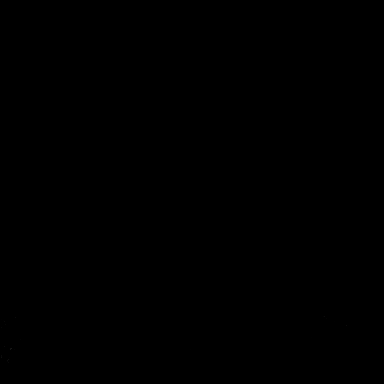
[im 36/144]
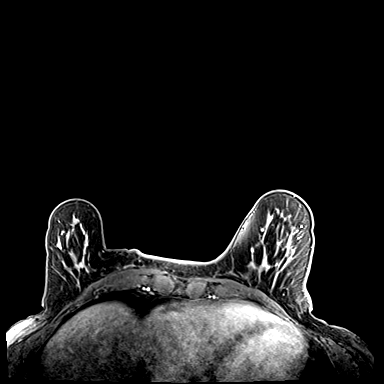
[im 72/144]
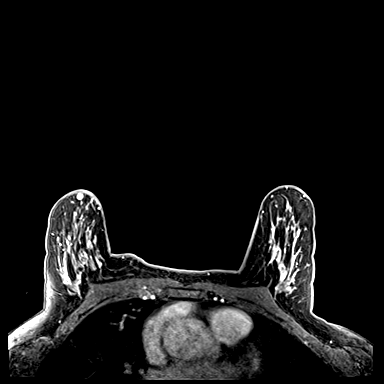
[im 108/144]
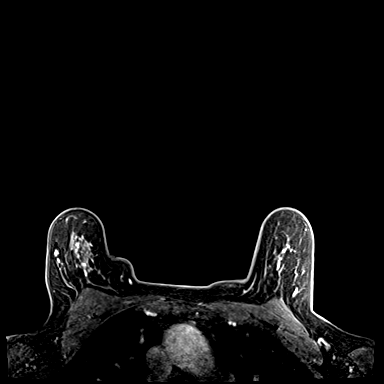
[im 144/144]
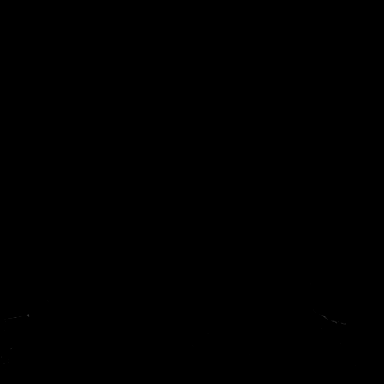

[Series 9: dynamic post 3 · axial · 1.3mm · 0.73mm/px · 1 of 144 slices shown (2 of 2)]
[im 1/144]
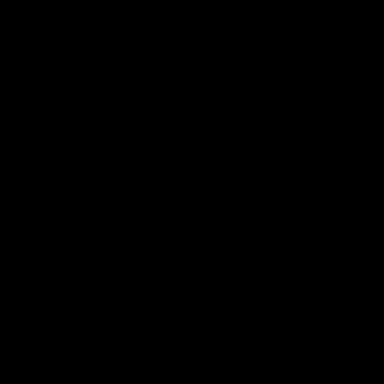

[29 of 48 positions shown; findings below may reference images not displayed]

FINDINGS: I met with the patient, and we discussed the procedure of MRI guided
biopsy, including risks, benefits, and alternatives. Specifically,
we discussed the risks of infection, bleeding, tissue injury, clip
migration, and inadequate sampling. Informed, written consent was
given. The usual time out protocol was performed immediately prior
to the procedure.

SITE 1: LEFT BREAST UPPER INNER QUADRANT: ENHANCING MASS: Using
sterile technique, 1% Lidocaine, MRI guidance, and a 9 gauge vacuum
assisted device, biopsy was performed of the enhancing mass in the
upper inner quadrant of the left breast using a lateral to medial
approach. At the conclusion of the procedure, a dumbbell shaped
tissue marker clip was deployed into the biopsy cavity. Follow-up
2-view mammogram was performed and dictated separately.

SITE 2: RIGHT BREAST UPPER CENTRAL POSTERIOR: ENHANCING MASS: Using
sterile technique, 1% Lidocaine, MRI guidance, and a 9 gauge vacuum
assisted device, biopsy was performed of enhancing mass in the upper
central posterior right breast using a lateral to medial approach.
At the conclusion of the procedure, a dumbbell shaped tissue marker
clip was deployed into the biopsy cavity. Follow-up 2-view mammogram
was performed and dictated separately.

SITE 3: RIGHT BREAST LOWER INNER NON MASS ENHANCEMENT: Using sterile
technique, 1% Lidocaine, MRI guidance, and a 9 gauge vacuum assisted
device, biopsy was performed of the non mass enhancement in the
lower inner right breast extending anteriorly from the dominant mass
using a lateral to medial approach. At the conclusion of the
procedure, a cylindrical shaped tissue marker clip was deployed into
the biopsy cavity. Follow-up 2-view mammogram was performed and
dictated separately.
IMPRESSION: 1. MRI guided biopsy of the enhancing mass in the upper inner
quadrant of the left breast, at site of dumbbell shaped biopsy
marking clip.

2. MRI guided biopsy of the enhancing mass in the upper central
posterior right breast, at site of dumbbell shaped biopsy marking
clip.

3. MRI guided biopsy of the non mass enhancement in the lower inner
quadrant of the right mass, at site of cylindrical shaped biopsy
marking clip.

ADDENDUM:
Pathology revealed FIBROCYSTIC CHANGES WITH USUAL DUCTAL HYPERPLASIA
AND CALCIFICATIONS of the LEFT breast, upper inner quadrant. This
was found to be concordant by Dr. LUECK.

Pathology revealed FIBROCYSTIC CHANGES WITH USUAL DUCTAL HYPERPLASIA
AND CALCIFICATIONS. FIBROADENOMA of the RIGHT breast, upper central
posterior. This was found to be concordant by Dr. LUECK.

Pathology revealed FIBROCYSTIC CHANGES of the RIGHT breast, lower
inner non mass enhancement. This was found to be discordant by Dr.
LUECK, with excision recommended.

Pathology results were discussed with the patient by telephone. The
patient reported doing well after the biopsies with tenderness at
the sites. Post biopsy instructions and care were reviewed and
questions were answered. The patient was encouraged to call The

Dr. LUECK Nurse LUECK was made aware of these results. She will
communicate Dr. LUECK instructions with patient.

Pathology results reported by LUECK, RN on [DATE].

*** End of Addendum ***
FINDINGS: I met with the patient, and we discussed the procedure of MRI guided
biopsy, including risks, benefits, and alternatives. Specifically,
we discussed the risks of infection, bleeding, tissue injury, clip
migration, and inadequate sampling. Informed, written consent was
given. The usual time out protocol was performed immediately prior
to the procedure.

SITE 1: LEFT BREAST UPPER INNER QUADRANT: ENHANCING MASS: Using
sterile technique, 1% Lidocaine, MRI guidance, and a 9 gauge vacuum
assisted device, biopsy was performed of the enhancing mass in the
upper inner quadrant of the left breast using a lateral to medial
approach. At the conclusion of the procedure, a dumbbell shaped
tissue marker clip was deployed into the biopsy cavity. Follow-up
2-view mammogram was performed and dictated separately.

SITE 2: RIGHT BREAST UPPER CENTRAL POSTERIOR: ENHANCING MASS: Using
sterile technique, 1% Lidocaine, MRI guidance, and a 9 gauge vacuum
assisted device, biopsy was performed of enhancing mass in the upper
central posterior right breast using a lateral to medial approach.
At the conclusion of the procedure, a dumbbell shaped tissue marker
clip was deployed into the biopsy cavity. Follow-up 2-view mammogram
was performed and dictated separately.

SITE 3: RIGHT BREAST LOWER INNER NON MASS ENHANCEMENT: Using sterile
technique, 1% Lidocaine, MRI guidance, and a 9 gauge vacuum assisted
device, biopsy was performed of the non mass enhancement in the
lower inner right breast extending anteriorly from the dominant mass
using a lateral to medial approach. At the conclusion of the
procedure, a cylindrical shaped tissue marker clip was deployed into
the biopsy cavity. Follow-up 2-view mammogram was performed and
dictated separately.
IMPRESSION: 1. MRI guided biopsy of the enhancing mass in the upper inner
quadrant of the left breast, at site of dumbbell shaped biopsy
marking clip.

2. MRI guided biopsy of the enhancing mass in the upper central
posterior right breast, at site of dumbbell shaped biopsy marking
clip.

3. MRI guided biopsy of the non mass enhancement in the lower inner
quadrant of the right mass, at site of cylindrical shaped biopsy
marking clip.

## 2019-08-21 IMAGING — MG MM BREAST LOCALIZATION CLIP
4 series · 4 of 12 positions shown · non-contrast
Comparison: Previous exam(s).

CLINICAL DATA: Post MRI guided biopsy of an enhancing mass in the
upper inner quadrant of the left breast, an enhancing mass in the
upper central posterior right breast and non mass enhancement in the
lower inner right breast.

EXAM:
DIAGNOSTIC BILATERAL MAMMOGRAM POST MRI BIOPSY

[L ML synth-2D]
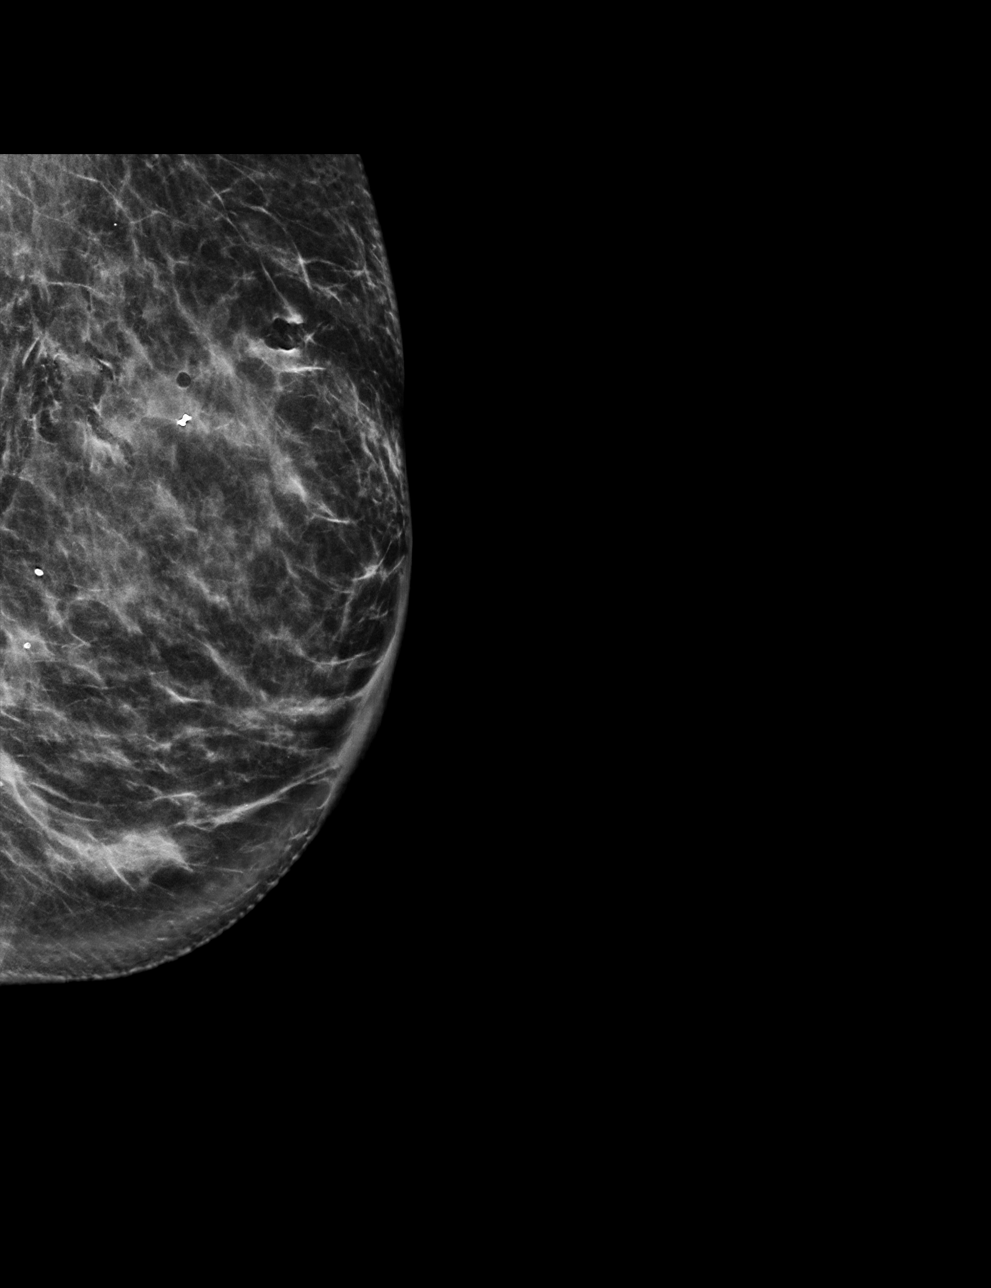

[L CC synth-2D]
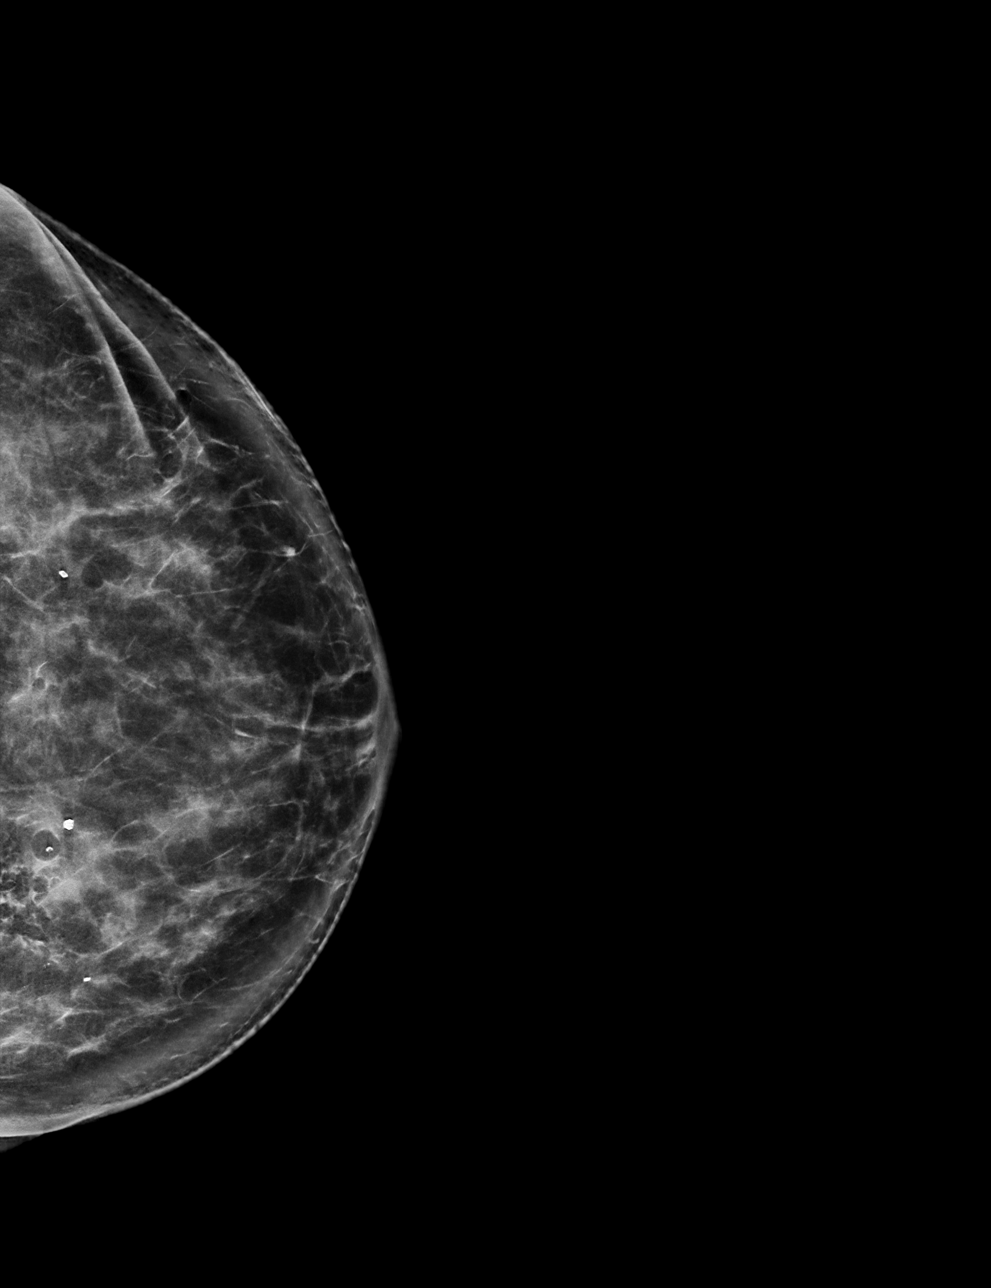

[L CC tomo · tomo slice 39/76.0]
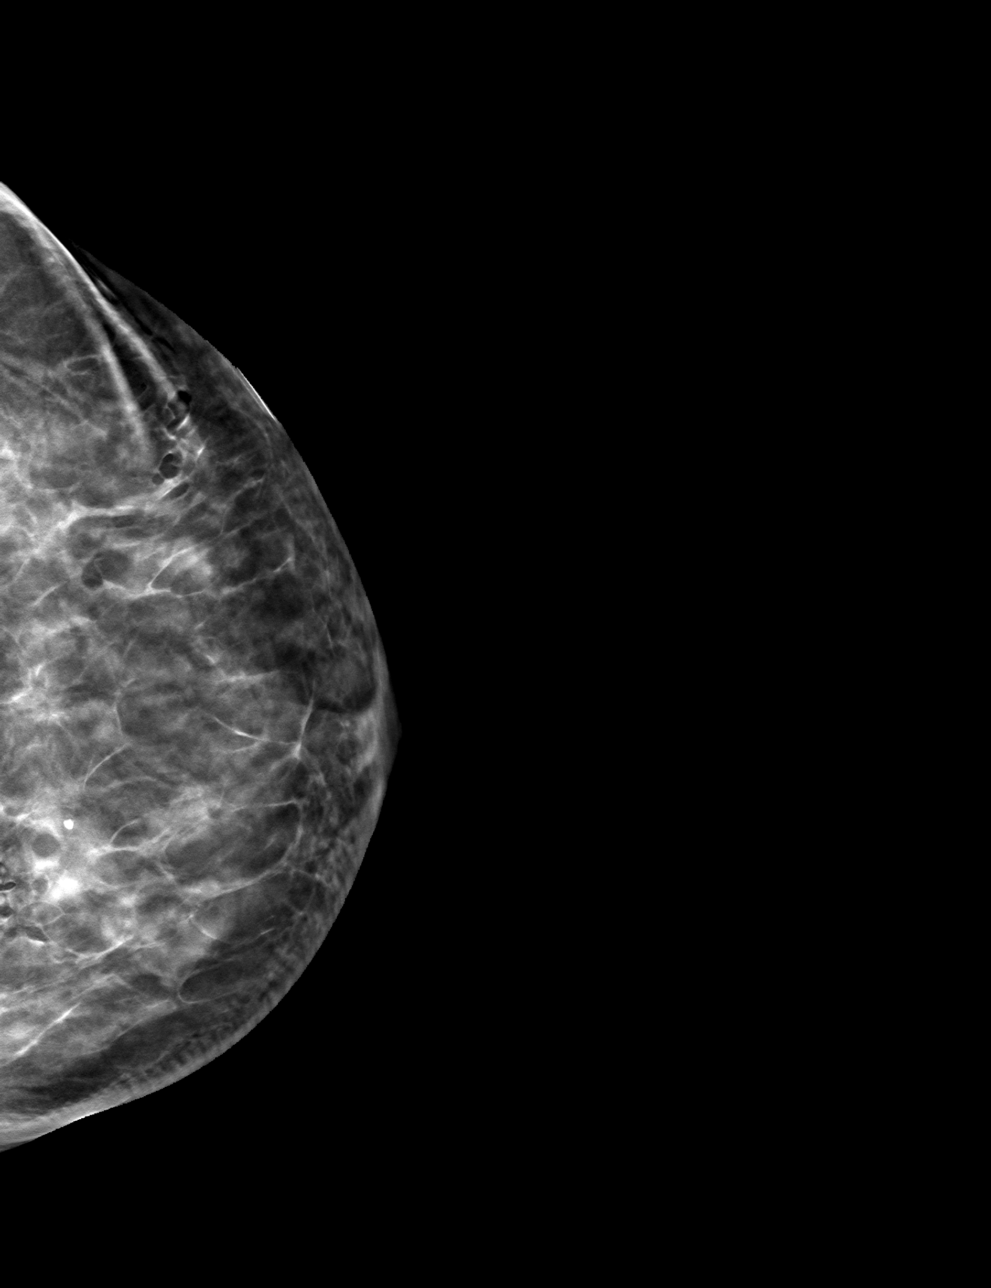

[L ML tomo · tomo slice 38/75.0]
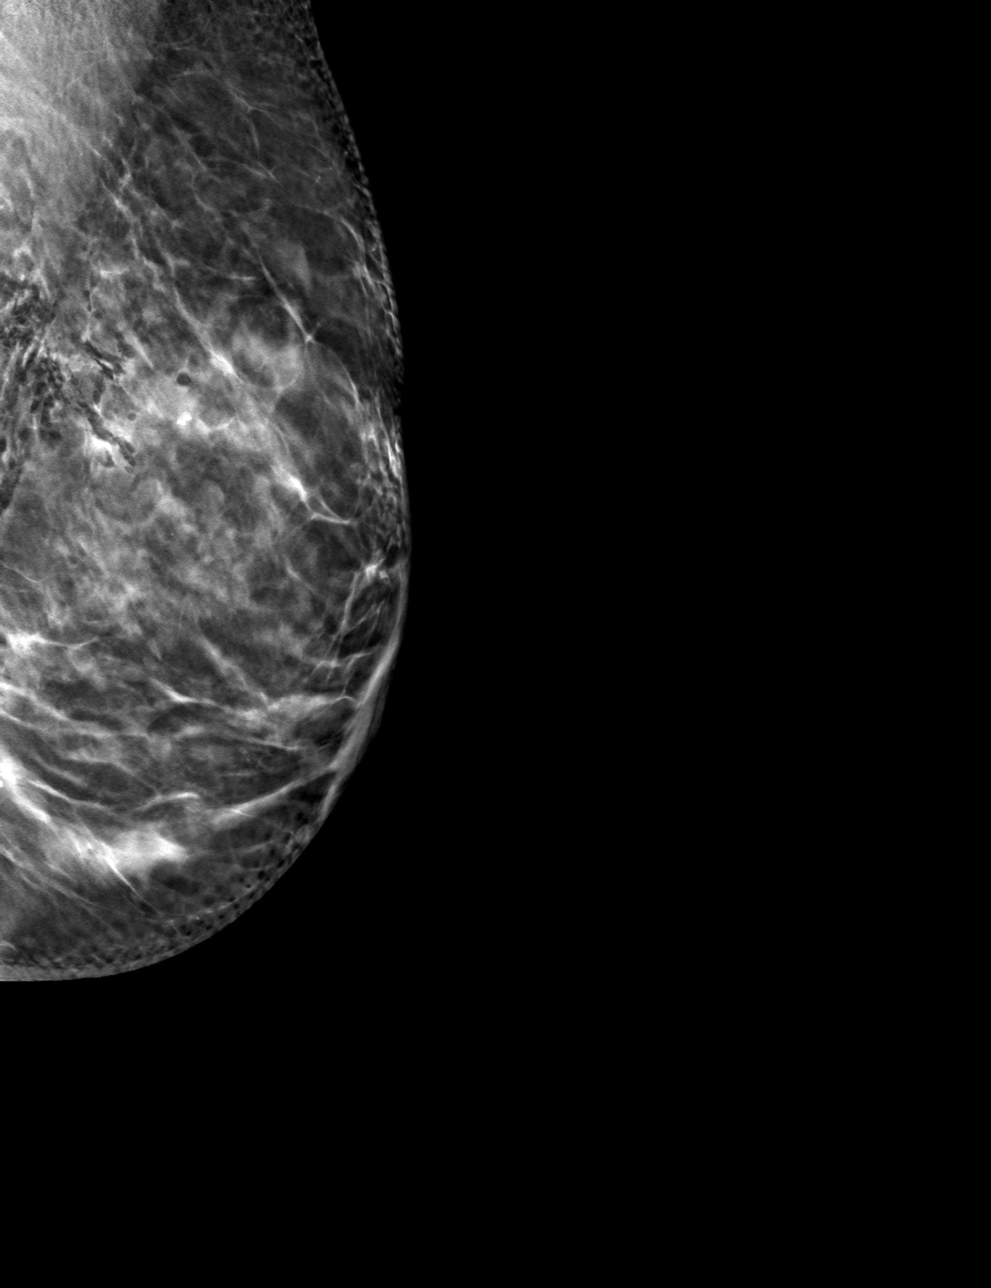

[4 of 12 positions shown; findings below may reference images not displayed]

FINDINGS: Mammographic images were obtained following MRI guided biopsy of an
enhancing mass in the upper inner quadrant of the left breast, an
enhancing mass in the upper central posterior right breast and non
mass enhancement in the lower inner right breast. A dumbbell shaped
biopsy marking clip is present at the site of the biopsied enhancing
mass in the upper central posterior right breast. A cylindrical
shaped biopsy marking clip is present at the site of the non mass
enhancement within the lower inner right breast. A dumbbell shaped
biopsy marking clip is present at the site of the biopsied enhancing
mass in the upper inner left breast.
IMPRESSION: 1. Dumbbell shaped biopsy marking clip at site of biopsied enhancing
mass in the upper inner left breast.

2. Dumbbell shaped biopsy marking clip at site of biopsied enhancing
mass in the upper central posterior right breast.

3. Cylindrical shaped biopsy marking clip at site of biopsied non
mass enhancement in the lower inner right breast.

Final Assessment: Post Procedure Mammograms for Marker Placement

## 2019-08-21 IMAGING — MR MR BREAST BX W/ LOC DEV 1ST LEASION IMAGE BX SPEC MR GUIDE*R*
7 of 11 series · 29 of 48 positions shown · IV contrast (6 ml Gadavist)
Comparison: Previous exams.
COMPARISON: Previous exams.

Addendum:
CLINICAL DATA: 63-year-old female with recently diagnosed right
breast cancer presents for MRI guided biopsy of an enhancing mass in
the upper inner quadrant of the left breast, an enhancing mass in
the central posterior right breast and linear non mass enhancement
extending anteriorly from the dominant mass in the lower inner right
breast.

EXAM:
MRI GUIDED CORE NEEDLE BIOPSY OF THE BILATERAL BREAST
TECHNIQUE: Multiplanar, multisequence MR imaging of the bilateral breasts was
performed both before and after administration of intravenous
contrast.
CONTRAST:  6 mL Gadavist.

[Series 3: fiducial bilateral · sagittal · 2.0mm · 1.33mm/px · 5 of 144 slices shown]
[im 1/144]
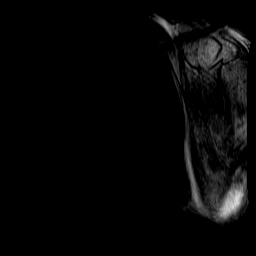
[im 36/144]
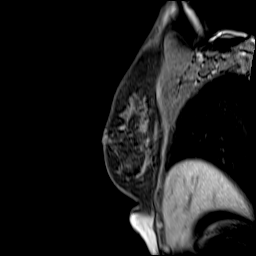
[im 72/144]
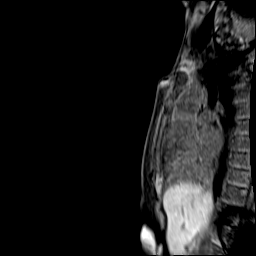
[im 108/144]
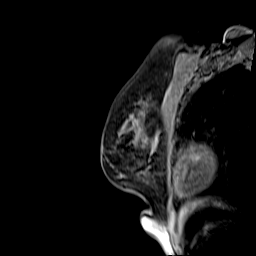
[im 144/144]
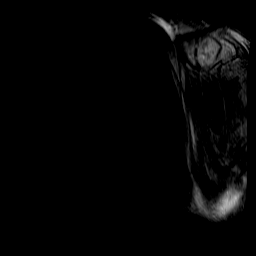

[Series 4: dynamic pre · axial · non-contrast · 1.3mm · 0.73mm/px · z∈[-78,+108]mm · 5 of 144 slices shown (1 of 2)]
[im 1/144]
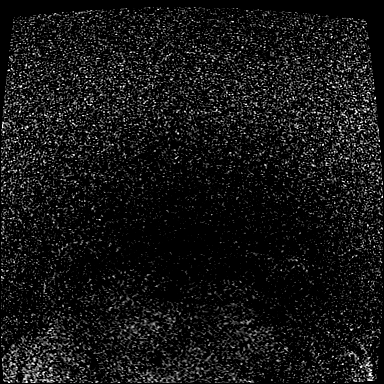
[im 36/144]
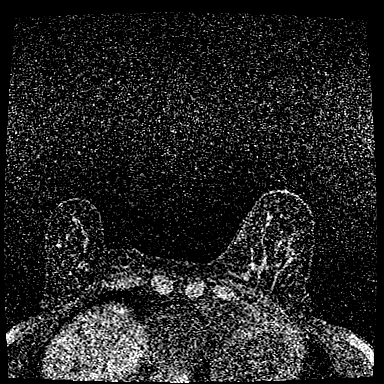
[im 72/144]
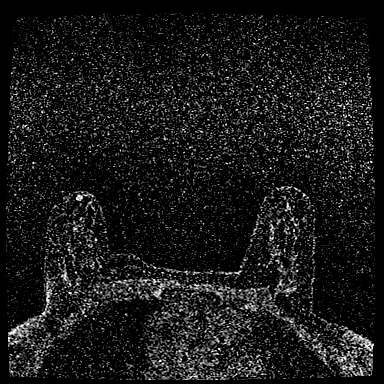
[im 108/144]
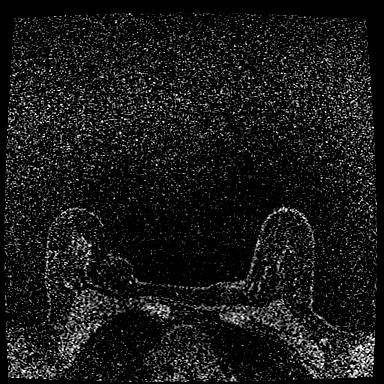
[im 144/144]
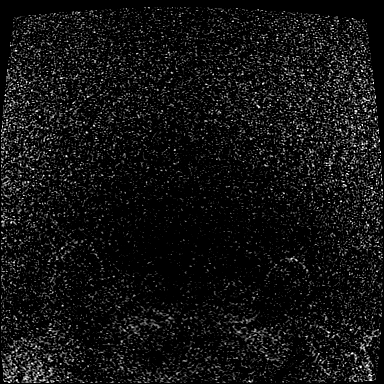

[Series 5: dynamic pre · axial · non-contrast · 1.3mm · 0.73mm/px · z∈[-78,+108]mm · 5 of 144 slices shown (2 of 2)]
[im 1/144]
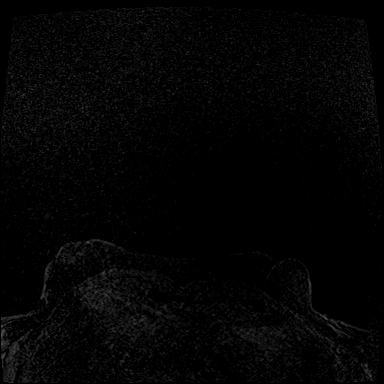
[im 36/144]
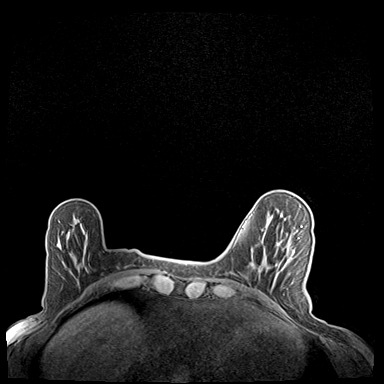
[im 72/144]
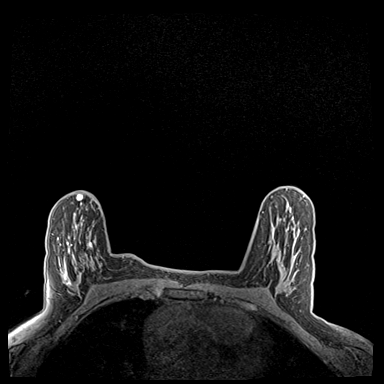
[im 108/144]
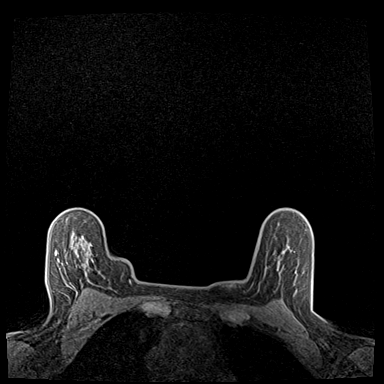
[im 144/144]
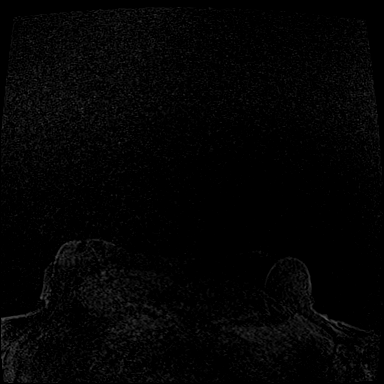

[Series 6: dynamic post 20 · axial · 1.3mm · 0.73mm/px · z∈[-78,+108]mm · 5 of 144 slices shown (1 of 2)]
[im 1/144]
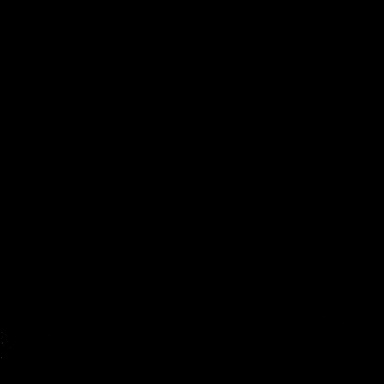
[im 36/144]
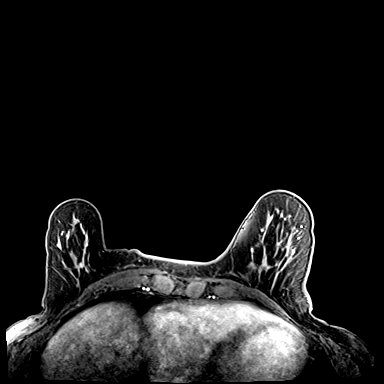
[im 72/144]
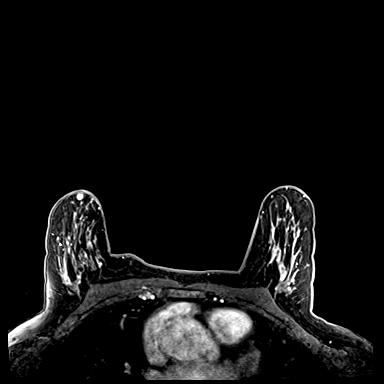
[im 108/144]
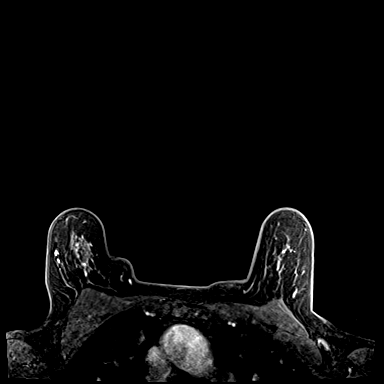
[im 144/144]
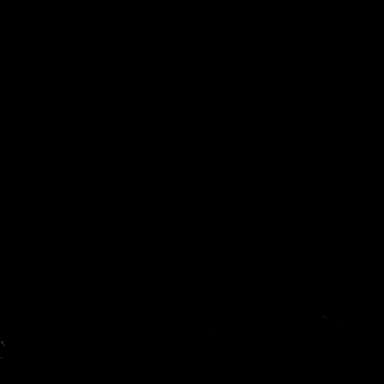

[Series 7: dynamic post 20 · axial · 1.3mm · 0.73mm/px · z∈[-78,+108]mm · 4 of 144 slices shown (2 of 2)]
[im 1/144]
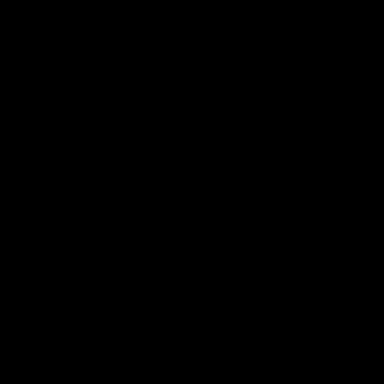
[im 48/144]
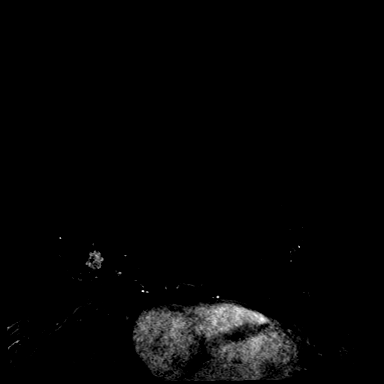
[im 96/144]
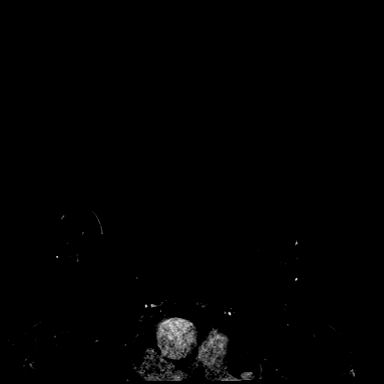
[im 144/144]
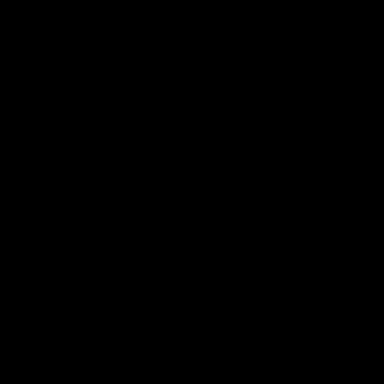

[Series 8: dynamic post 3 · axial · 1.3mm · 0.73mm/px · z∈[-78,+108]mm · 4 of 144 slices shown (1 of 2)]
[im 1/144]
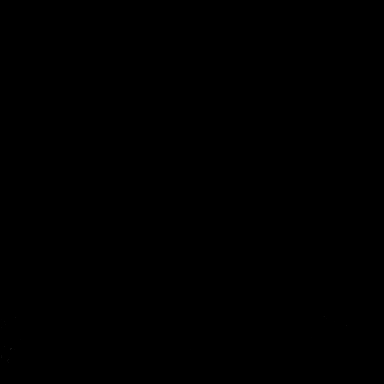
[im 48/144]
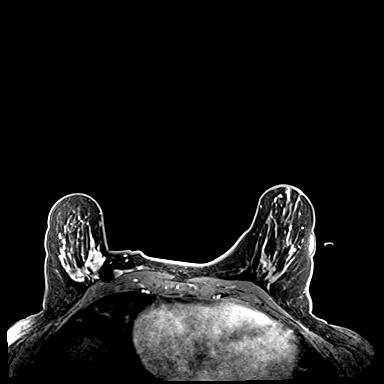
[im 96/144]
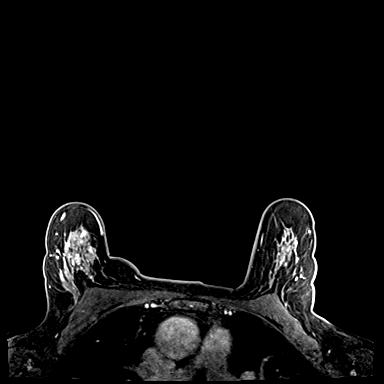
[im 144/144]
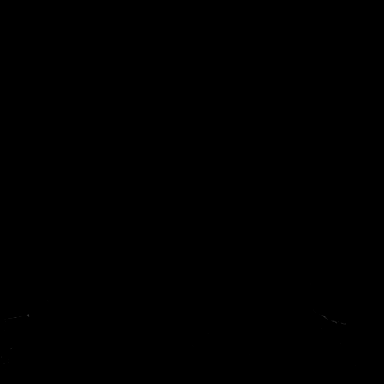

[Series 9: dynamic post 3 · axial · 1.3mm · 0.73mm/px · 1 of 144 slices shown (2 of 2)]
[im 1/144]
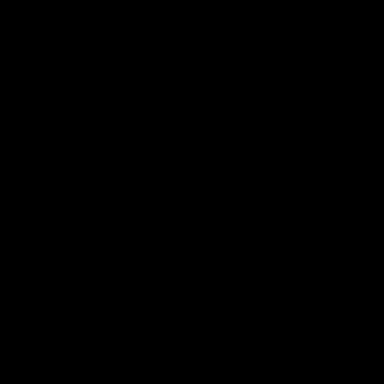

[29 of 48 positions shown; findings below may reference images not displayed]

FINDINGS: I met with the patient, and we discussed the procedure of MRI guided
biopsy, including risks, benefits, and alternatives. Specifically,
we discussed the risks of infection, bleeding, tissue injury, clip
migration, and inadequate sampling. Informed, written consent was
given. The usual time out protocol was performed immediately prior
to the procedure.

SITE 1: LEFT BREAST UPPER INNER QUADRANT: ENHANCING MASS: Using
sterile technique, 1% Lidocaine, MRI guidance, and a 9 gauge vacuum
assisted device, biopsy was performed of the enhancing mass in the
upper inner quadrant of the left breast using a lateral to medial
approach. At the conclusion of the procedure, a dumbbell shaped
tissue marker clip was deployed into the biopsy cavity. Follow-up
2-view mammogram was performed and dictated separately.

SITE 2: RIGHT BREAST UPPER CENTRAL POSTERIOR: ENHANCING MASS: Using
sterile technique, 1% Lidocaine, MRI guidance, and a 9 gauge vacuum
assisted device, biopsy was performed of enhancing mass in the upper
central posterior right breast using a lateral to medial approach.
At the conclusion of the procedure, a dumbbell shaped tissue marker
clip was deployed into the biopsy cavity. Follow-up 2-view mammogram
was performed and dictated separately.

SITE 3: RIGHT BREAST LOWER INNER NON MASS ENHANCEMENT: Using sterile
technique, 1% Lidocaine, MRI guidance, and a 9 gauge vacuum assisted
device, biopsy was performed of the non mass enhancement in the
lower inner right breast extending anteriorly from the dominant mass
using a lateral to medial approach. At the conclusion of the
procedure, a cylindrical shaped tissue marker clip was deployed into
the biopsy cavity. Follow-up 2-view mammogram was performed and
dictated separately.
IMPRESSION: 1. MRI guided biopsy of the enhancing mass in the upper inner
quadrant of the left breast, at site of dumbbell shaped biopsy
marking clip.

2. MRI guided biopsy of the enhancing mass in the upper central
posterior right breast, at site of dumbbell shaped biopsy marking
clip.

3. MRI guided biopsy of the non mass enhancement in the lower inner
quadrant of the right mass, at site of cylindrical shaped biopsy
marking clip.

ADDENDUM:
Pathology revealed FIBROCYSTIC CHANGES WITH USUAL DUCTAL HYPERPLASIA
AND CALCIFICATIONS of the LEFT breast, upper inner quadrant. This
was found to be concordant by Dr. LUECK.

Pathology revealed FIBROCYSTIC CHANGES WITH USUAL DUCTAL HYPERPLASIA
AND CALCIFICATIONS. FIBROADENOMA of the RIGHT breast, upper central
posterior. This was found to be concordant by Dr. LUECK.

Pathology revealed FIBROCYSTIC CHANGES of the RIGHT breast, lower
inner non mass enhancement. This was found to be discordant by Dr.
LUECK, with excision recommended.

Pathology results were discussed with the patient by telephone. The
patient reported doing well after the biopsies with tenderness at
the sites. Post biopsy instructions and care were reviewed and
questions were answered. The patient was encouraged to call The

Dr. LUECK Nurse LUECK was made aware of these results. She will
communicate Dr. LUECK instructions with patient.

Pathology results reported by LUECK, RN on [DATE].

*** End of Addendum ***
FINDINGS: I met with the patient, and we discussed the procedure of MRI guided
biopsy, including risks, benefits, and alternatives. Specifically,
we discussed the risks of infection, bleeding, tissue injury, clip
migration, and inadequate sampling. Informed, written consent was
given. The usual time out protocol was performed immediately prior
to the procedure.

SITE 1: LEFT BREAST UPPER INNER QUADRANT: ENHANCING MASS: Using
sterile technique, 1% Lidocaine, MRI guidance, and a 9 gauge vacuum
assisted device, biopsy was performed of the enhancing mass in the
upper inner quadrant of the left breast using a lateral to medial
approach. At the conclusion of the procedure, a dumbbell shaped
tissue marker clip was deployed into the biopsy cavity. Follow-up
2-view mammogram was performed and dictated separately.

SITE 2: RIGHT BREAST UPPER CENTRAL POSTERIOR: ENHANCING MASS: Using
sterile technique, 1% Lidocaine, MRI guidance, and a 9 gauge vacuum
assisted device, biopsy was performed of enhancing mass in the upper
central posterior right breast using a lateral to medial approach.
At the conclusion of the procedure, a dumbbell shaped tissue marker
clip was deployed into the biopsy cavity. Follow-up 2-view mammogram
was performed and dictated separately.

SITE 3: RIGHT BREAST LOWER INNER NON MASS ENHANCEMENT: Using sterile
technique, 1% Lidocaine, MRI guidance, and a 9 gauge vacuum assisted
device, biopsy was performed of the non mass enhancement in the
lower inner right breast extending anteriorly from the dominant mass
using a lateral to medial approach. At the conclusion of the
procedure, a cylindrical shaped tissue marker clip was deployed into
the biopsy cavity. Follow-up 2-view mammogram was performed and
dictated separately.
IMPRESSION: 1. MRI guided biopsy of the enhancing mass in the upper inner
quadrant of the left breast, at site of dumbbell shaped biopsy
marking clip.

2. MRI guided biopsy of the enhancing mass in the upper central
posterior right breast, at site of dumbbell shaped biopsy marking
clip.

3. MRI guided biopsy of the non mass enhancement in the lower inner
quadrant of the right mass, at site of cylindrical shaped biopsy
marking clip.

## 2019-08-21 IMAGING — MG MM BREAST LOCALIZATION CLIP
4 series · 4 of 12 positions shown · non-contrast
Comparison: Previous exam(s).

CLINICAL DATA: Post MRI guided biopsy of an enhancing mass in the
upper inner quadrant of the left breast, an enhancing mass in the
upper central posterior right breast and non mass enhancement in the
lower inner right breast.

EXAM:
DIAGNOSTIC BILATERAL MAMMOGRAM POST MRI BIOPSY

[R CC synth-2D]
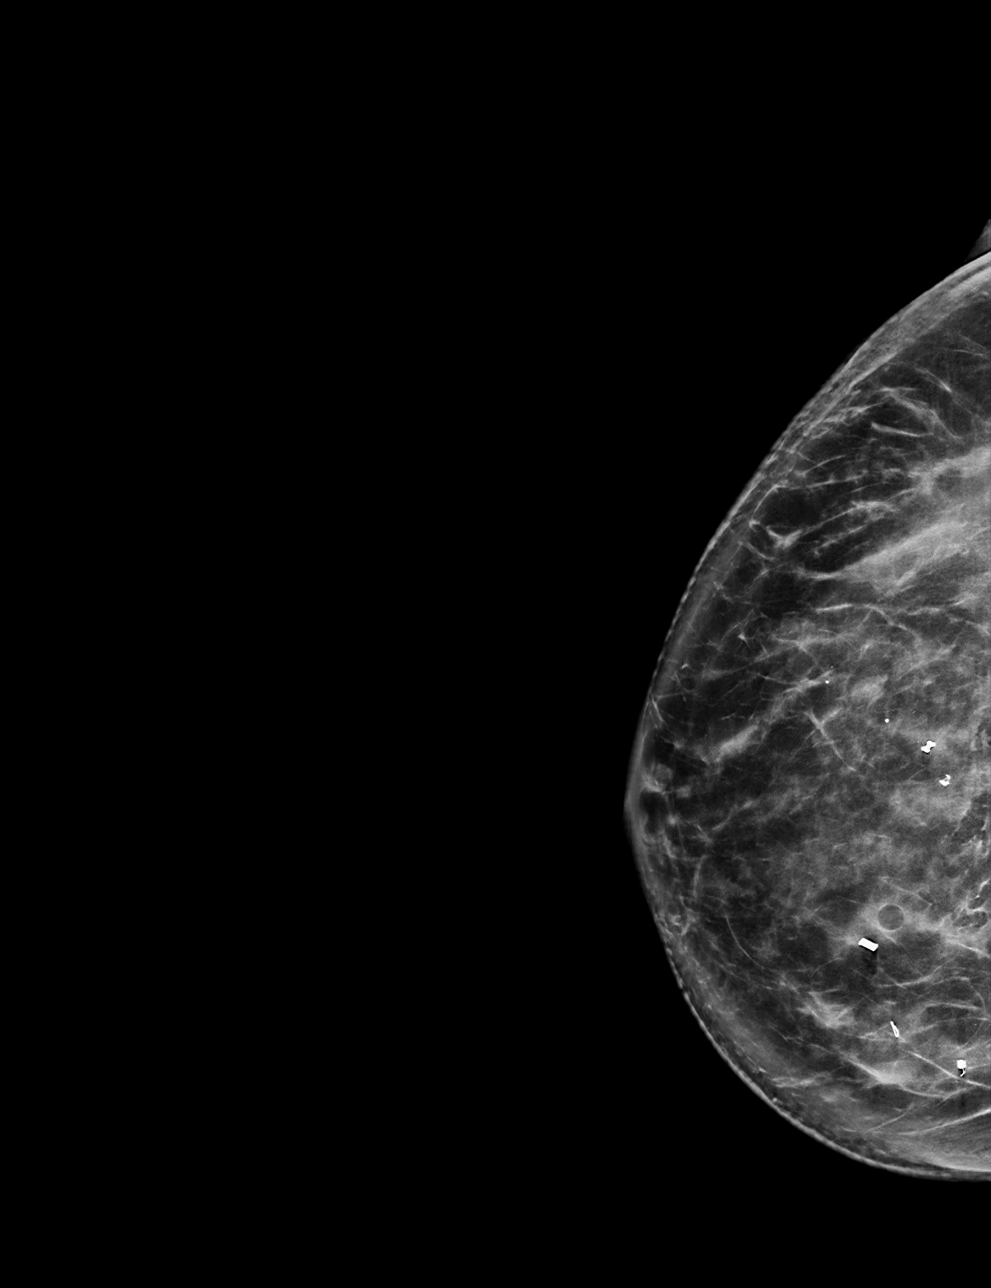

[R ML synth-2D]
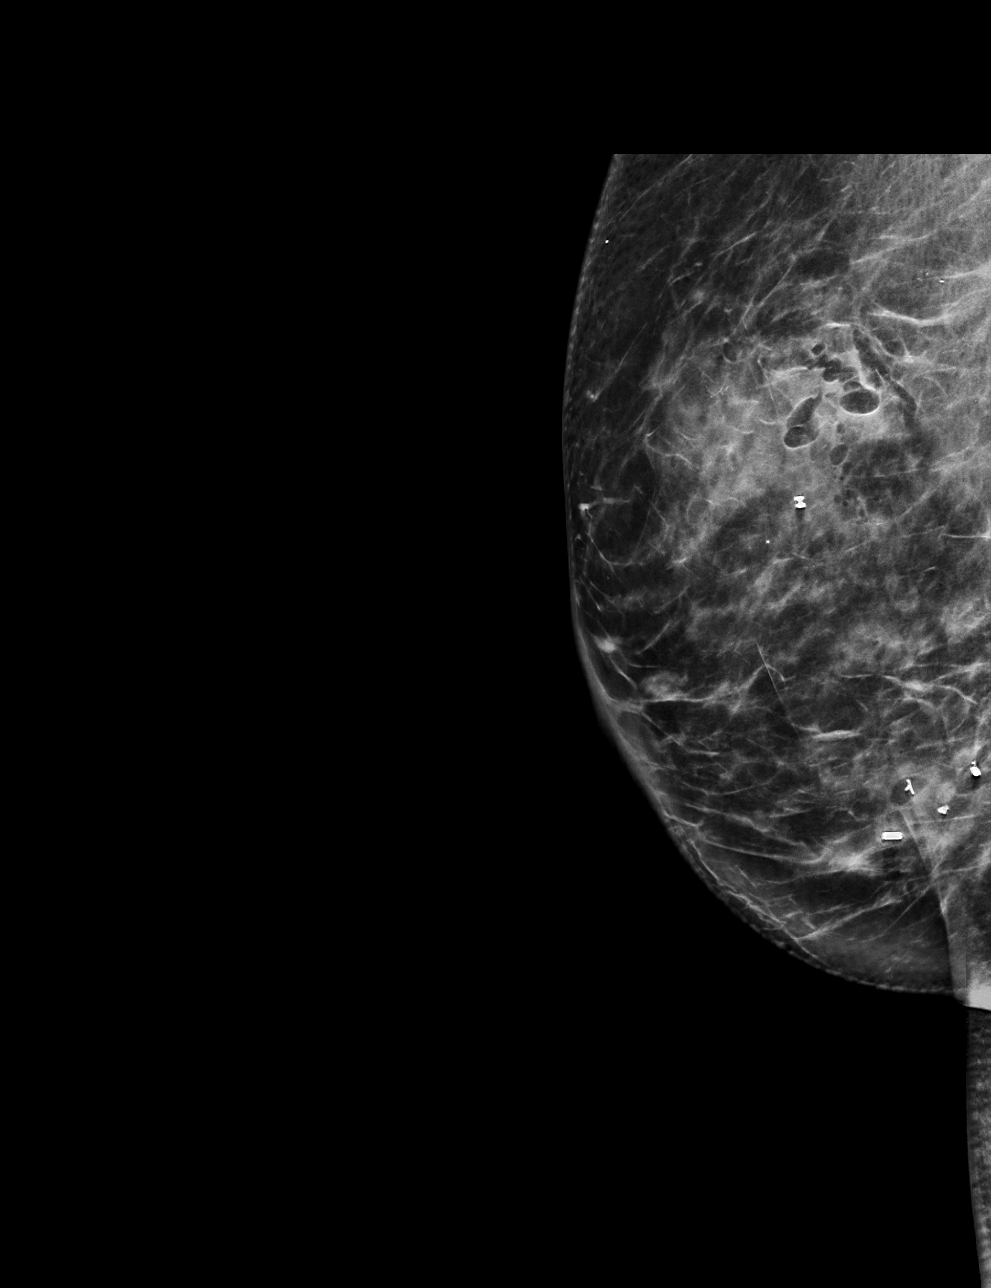

[R CC tomo · tomo slice 43/84.0]
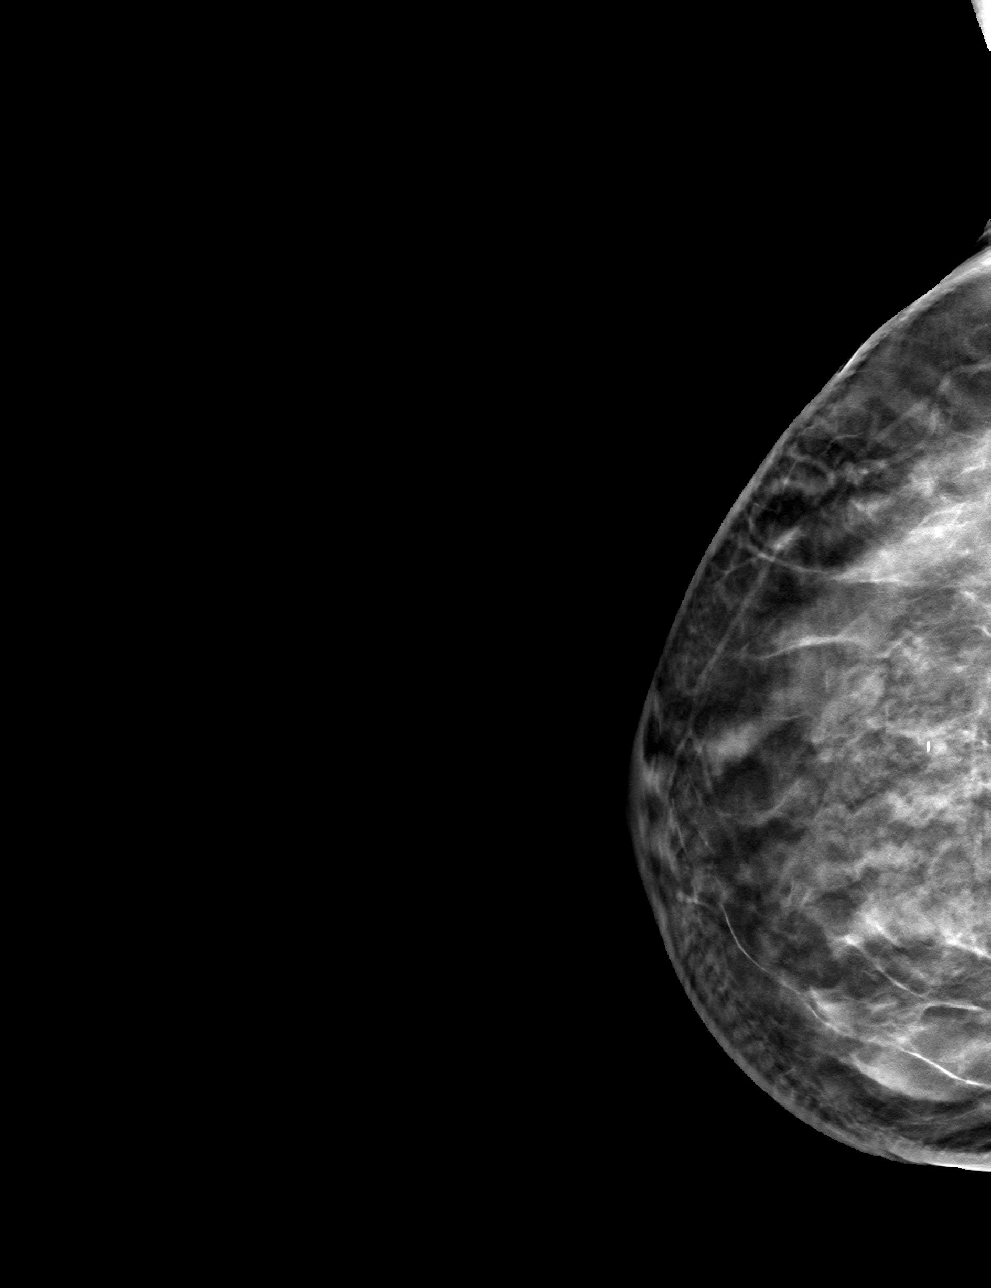

[R ML tomo · tomo slice 38/75.0]
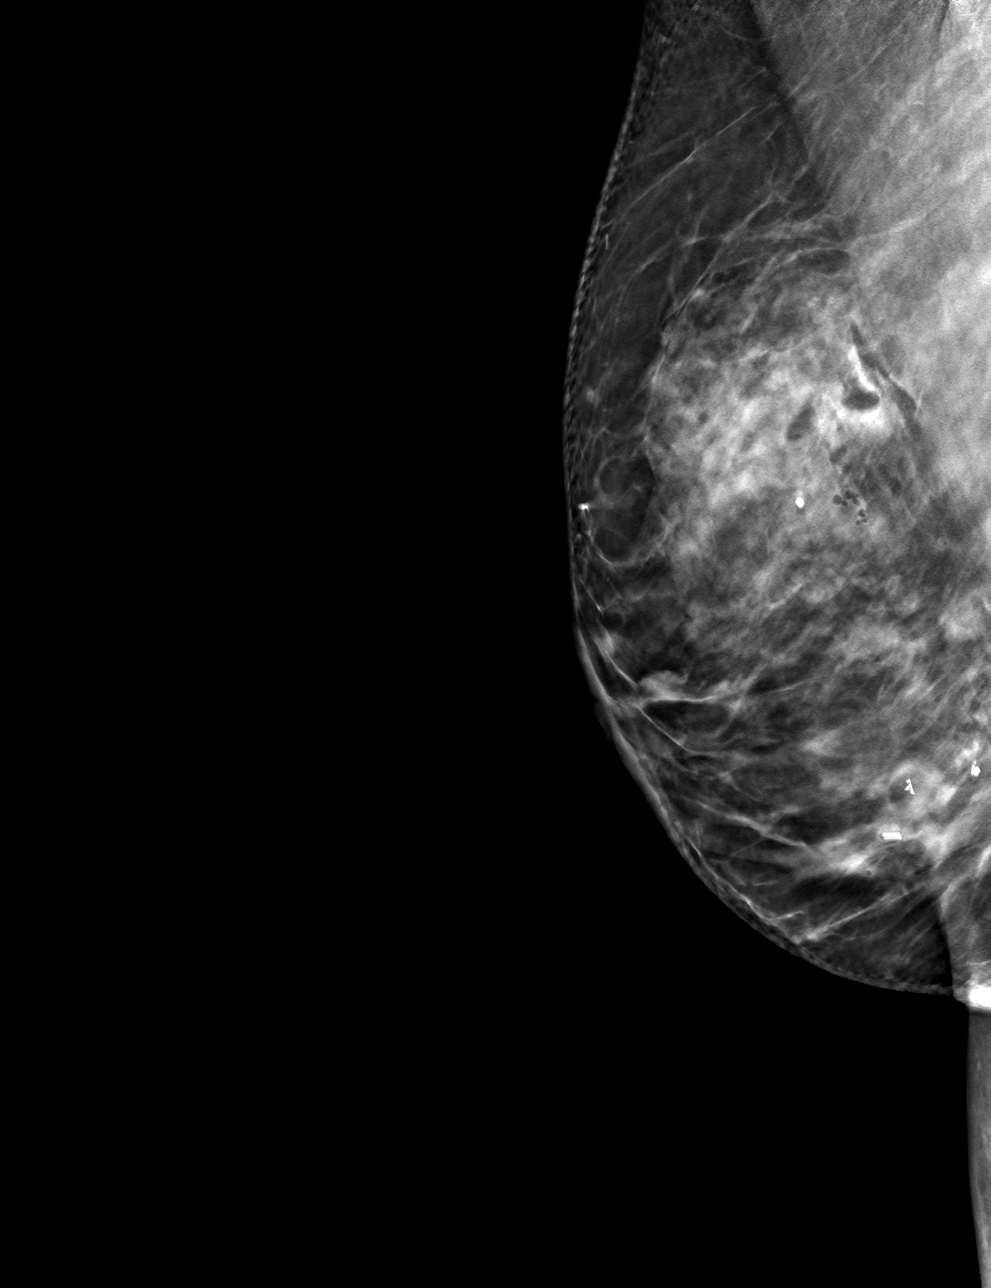

[4 of 12 positions shown; findings below may reference images not displayed]

FINDINGS: Mammographic images were obtained following MRI guided biopsy of an
enhancing mass in the upper inner quadrant of the left breast, an
enhancing mass in the upper central posterior right breast and non
mass enhancement in the lower inner right breast. A dumbbell shaped
biopsy marking clip is present at the site of the biopsied enhancing
mass in the upper central posterior right breast. A cylindrical
shaped biopsy marking clip is present at the site of the non mass
enhancement within the lower inner right breast. A dumbbell shaped
biopsy marking clip is present at the site of the biopsied enhancing
mass in the upper inner left breast.
IMPRESSION: 1. Dumbbell shaped biopsy marking clip at site of biopsied enhancing
mass in the upper inner left breast.

2. Dumbbell shaped biopsy marking clip at site of biopsied enhancing
mass in the upper central posterior right breast.

3. Cylindrical shaped biopsy marking clip at site of biopsied non
mass enhancement in the lower inner right breast.

Final Assessment: Post Procedure Mammograms for Marker Placement

## 2019-08-21 MED ORDER — GADOBUTROL 1 MMOL/ML IV SOLN
6.0000 mL | Freq: Once | INTRAVENOUS | Status: AC | PRN
  Administered 2019-08-21: 6 mL via INTRAVENOUS

## 2019-08-28 ENCOUNTER — Other Ambulatory Visit: Payer: Self-pay | Admitting: General Surgery

## 2019-08-28 DIAGNOSIS — C50311 Malignant neoplasm of lower-inner quadrant of right female breast: Secondary | ICD-10-CM

## 2019-08-28 DIAGNOSIS — R928 Other abnormal and inconclusive findings on diagnostic imaging of breast: Secondary | ICD-10-CM

## 2019-09-05 ENCOUNTER — Encounter: Payer: Self-pay | Admitting: *Deleted

## 2019-09-06 ENCOUNTER — Encounter: Payer: Self-pay | Admitting: *Deleted

## 2019-09-06 ENCOUNTER — Telehealth: Payer: Self-pay | Admitting: General Surgery

## 2019-09-06 NOTE — Telephone Encounter (Signed)
-----   Message from April A Staton sent at 09/05/2019 10:34 AM EST ----- Regarding: update Tonia Ghent & spoke with Hinton Dyer today. We will have her talk with Dr. Barry Dienes tomorrow around 1:30 by phone, after discussion we can see how to move forward.   Dr. Barry Dienes,   I will have this added to your outlook and to allscripts. You have provided sx orders for right breast seed bracketed lumpectomy, right breast seed loc ex biopsy, right SLN bx. Just keep me in the loop after your phone call.  Thanks ladies! April

## 2019-09-06 NOTE — Telephone Encounter (Signed)
Reviewed films with radiology.  We could still get this with two seeds and total amount would be around 3.5 cm of tissue for around 20% of breast.    Will refer to plastics to review what options are available if she has significantly smaller affected breast or if we have significant positive margins and want to transition to mastectomy.

## 2019-09-07 ENCOUNTER — Ambulatory Visit: Payer: Commercial Managed Care - PPO | Admitting: Obstetrics and Gynecology

## 2019-09-07 ENCOUNTER — Other Ambulatory Visit: Payer: Self-pay

## 2019-09-07 ENCOUNTER — Encounter: Payer: Self-pay | Admitting: Obstetrics and Gynecology

## 2019-09-07 ENCOUNTER — Ambulatory Visit (INDEPENDENT_AMBULATORY_CARE_PROVIDER_SITE_OTHER): Payer: Commercial Managed Care - PPO

## 2019-09-07 VITALS — BP 120/76 | HR 70 | Temp 97.3°F | Ht 64.0 in | Wt 138.6 lb

## 2019-09-07 DIAGNOSIS — N83201 Unspecified ovarian cyst, right side: Secondary | ICD-10-CM | POA: Diagnosis not present

## 2019-09-07 DIAGNOSIS — D219 Benign neoplasm of connective and other soft tissue, unspecified: Secondary | ICD-10-CM

## 2019-09-07 NOTE — Progress Notes (Signed)
Encounter reviewed by Dr. Molly Savarino Amundson C. Silva.  

## 2019-09-07 NOTE — Progress Notes (Signed)
GYNECOLOGY  VISIT   HPI: 64 y.o.   Married  Caucasian  female   G1P1001 with Patient's last menstrual period was 09/01/2007 (exact date).   here for follow up pelvic ultrasound.   She has a 27 x 23 mm calcified right ovarian cyst noted at her [name] on 07/06/19, done for postmenopausal bleeding occurring off and on. Her left ovary was normal. She also had multiple fibroids, the largest 49 mm.  Her EMS was 2.9 mm. Saline [name] showed no filling defects.   CA125 was 7.8 on 07/06/19.  Her EMB showed benign atrophy.  She is facing lumpectomy surgery for a new dx of right breast cancer.  It is not scheduled yet.  She is feeling quite overwhelmed about all of the options for her breast cancer care. She already started on Tamoxifen.   She has not started Effexor XR.  GYNECOLOGIC HISTORY: Patient's last menstrual period was 09/01/2007 (exact date). Contraception:  Tubal Menopausal hormone therapy: none Last mammogram: 08-07-19 MRI --see Epic Last pap smear: 06-09-17 Neg:Neg HR HPV,05-02-15 Neg:Neg HR Neg        OB History    Gravida  1   Para  1   Term  1   Preterm      AB      Living  1     SAB      TAB      Ectopic      Multiple      Live Births                 Patient Active Problem List   Diagnosis Date Noted  . Genetic testing 08/04/2019  . Family history of uterine cancer   . Family history of stomach cancer   . Family history of bone cancer   . Family history of brain cancer   . Postmenopausal bleeding 07/09/2019  . Right ovarian cyst 07/09/2019  . Malignant neoplasm of lower-inner quadrant of right breast of female, estrogen receptor positive ([place]) 07/06/2019  . History of Clostridioides difficile infection 06/17/2018  . Fibroids 08/06/2017  . Breast mass in female-left at 12:00-benign 08/29/2012    Past Medical History:  Diagnosis Date  . Contact lens/glasses fitting    wears contacts or glasses  . Diarrhea    --post gallbladder surgery  . Family  history of bone cancer   . Family history of brain cancer   . Family history of stomach cancer   . Family history of uterine cancer   . Fibroids   . Hypertension   . No pertinent past medical history   . Right ovarian cyst   . Vertigo     Past Surgical History:  Procedure Laterality Date  . BLADDER SUSPENSION    . BREAST BIOPSY  09/08/2012   Procedure: BREAST BIOPSY;  Surgeon: Odis Hollingshead, MD;  Location: [facility];  Service: General;  Laterality: Left;  remove left breast mass  . BREAST SURGERY     lumpectomy x2  . BUNIONECTOMY Right 01/2016  . CHOLECYSTECTOMY    . COLONOSCOPY    . TUBAL LIGATION      Current Outpatient Medications  Medication Sig Dispense Refill  . BLACK COHOSH EXTRACT PO Take by mouth.    . CRANBERRY PO Take by mouth.    . Lactobacillus Rhamnosus, GG, (CULTURELLE PO) Take by mouth.    . Magnesium 100 MG TABS Take by mouth.    . Melatonin 10 MG CAPS Take by mouth.    Marland Kitchen  mupirocin ointment (BACTROBAN) 2 % APPLY TO AFFECTED AREA EVERY DAY    . nystatin ointment (MYCOSTATIN) Apply 1 application topically 2 (two) times daily. Apply to affected area for up to 7 days. 30 g 0  . OVER THE COUNTER MEDICATION Vitamin C, B complex & quercetin    . Pumpkin Seed-Soy Germ (AZO BLADDER CONTROL/GO-LESS PO) Take by mouth.    . tamoxifen (NOLVADEX) 20 MG tablet Take 20 mg by mouth daily.    Marland Kitchen triamcinolone (KENALOG) 0.025 % ointment Apply 1 application topically 2 (two) times daily. Use for one week. 30 g 0  . UNABLE TO FIND Tumero XL    . valsartan (DIOVAN) 80 MG tablet Take 80 mg by mouth at bedtime.    Marland Kitchen venlafaxine XR (EFFEXOR-XR) 37.5 MG 24 hr capsule Take 1 capsule (37.5 mg total) by mouth daily with breakfast. (Patient not taking: Reported on 09/07/2019) 90 capsule 4   No current facility-administered medications for this visit.     ALLERGIES: Flagyl [metronidazole]  Family History  Problem Relation Age of Onset  . Hypertension Father   .  Stomach cancer Father        may have been colon, diagnosed in his 14s  . Depression Mother   . Dementia Mother   . Thyroid disease Sister   . Heart disease Sister   . Brain cancer Maternal Aunt 80  . Cancer Maternal Grandmother        undetermined type, diagnosed in her 7s  . Bone cancer Paternal Grandfather 59  . Uterine cancer Paternal Aunt 42  . Uterine cancer Cousin        paternal 1st cousin, diagnosed in her late 64s  . Uterine cancer Cousin 61       paternal 1st cousin    Social History   Socioeconomic History  . Marital status: Married    Spouse name: Not on file  . Number of children: Not on file  . Years of education: Not on file  . Highest education level: Not on file  Occupational History  . Not on file  Tobacco Use  . Smoking status: Former Smoker    Types: Cigarettes    Quit date: 03/15/1996    Years since quitting: 23.4  . Smokeless tobacco: Never Used  Substance and Sexual Activity  . Alcohol use: No    Alcohol/week: 0.0 standard drinks  . Drug use: No  . Sexual activity: Not Currently    Partners: Male    Birth control/protection: Post-menopausal, Surgical    Comment: BTL  Other Topics Concern  . Not on file  Social History Narrative  . Not on file   Social Determinants of Health   Financial Resource Strain:   . Difficulty of Paying Living Expenses: Not on file  Food Insecurity:   . Worried About Charity fundraiser in the Last Year: Not on file  . Ran Out of Food in the Last Year: Not on file  Transportation Needs:   . Lack of Transportation (Medical): Not on file  . Lack of Transportation (Non-Medical): Not on file  Physical Activity:   . Days of Exercise per Week: Not on file  . Minutes of Exercise per Session: Not on file  Stress:   . Feeling of Stress : Not on file  Social Connections:   . Frequency of Communication with Friends and Family: Not on file  . Frequency of Social Gatherings with Friends and Family: Not on file  .  Attends  Religious Services: Not on file  . Active Member of Clubs or Organizations: Not on file  . Attends Archivist Meetings: Not on file  . Marital Status: Not on file  Intimate Partner Violence:   . Fear of Current or Ex-Partner: Not on file  . Emotionally Abused: Not on file  . Physically Abused: Not on file  . Sexually Abused: Not on file    Review of Systems  All other systems reviewed and are negative.   PHYSICAL EXAMINATION:    BP 120/76   Pulse 70   Temp (!) 97.3 F (36.3 C) (Temporal)   Ht 5\' 4"  (1.626 m)   Wt 138 lb 9.6 oz (62.9 kg)   LMP 09/01/2007 (Exact Date)   BMI 23.79 kg/m     General appearance: alert, cooperative and appears stated age.  Tearful at times during the visit.   Pelvic [name] Multiple fibroids, largest 4.93 cm.  EMS 3.61 mm.  Right ovary with calcifications and shadowing - ovarian fibroma versus dermoid cyst.  No change from previous [name].  Left ovary normal.  No free fluid.  ASSESSMENT  Uterine fibroids.  Right ovarian dermoid versus fibroma.  Normal CA125.  Recurrent postmenopausal bleeding.  Atrophy on EMB. Breast cancer.  Negative genetic testing.  On Tamoxifen.  PLAN  We discussed her right ovarian cyst and indication for laparoscopic bilateral salpingo-oophorectomy with pelvic washings versus laparoscopic hysterectomy with bilateral salpingo-oophorectomy with pelvic washings.  We also reviewed Tamoxifen therapy and risk of endometrial cancer.  She opts for hysterectomy in addition to removal of tubes and ovaries.   She would like to coordinate care with her breast surgeon if possible.   ACOG HO on hysterectomy to patient.    An After Visit Summary was printed and given to the patient.  __25____ minutes face to face time of which over 50% was spent in counseling.

## 2019-09-08 ENCOUNTER — Other Ambulatory Visit: Payer: Self-pay | Admitting: General Surgery

## 2019-09-08 ENCOUNTER — Telehealth: Payer: Self-pay | Admitting: *Deleted

## 2019-09-08 NOTE — Telephone Encounter (Signed)
Spoke with patient to follow up from her appointment with Dr. Iran Planas.  She states she does not want any type of reconstruction at this time.  She is ready to move forward with lumpectomy. I will let Dr. Marlowe Aschoff office know.  She did see herr OB/GYN as well and has some fibroids and will have a total hysterectomy.  Dr. Elza Rafter office is getting in touch with Dr. Barry Dienes to see if surgeries can be done at the same time.  I will send a message to Dr. Marlowe Aschoff office as well.

## 2019-09-11 ENCOUNTER — Telehealth: Payer: Self-pay | Admitting: Obstetrics and Gynecology

## 2019-09-11 NOTE — Telephone Encounter (Signed)
Left message to call Hardin Hardenbrook at [phone]. 

## 2019-09-11 NOTE — Telephone Encounter (Signed)
Patient calling to speak with surgery scheduler regarding surgery with [facility].

## 2019-09-12 NOTE — Telephone Encounter (Signed)
Spoke with patient. Advised patient we are working with [facility] to try to coordinate surgery with Dr.Byerly and Dr.Silva on the same day. Patient states that she would like to proceed with combined case if this can be completed before March. If not would like to separate the cases and only proceed with breast surgery at this time. Advised will speak with [facility] and return call with further information. Patient is agreeable.

## 2019-09-12 NOTE — Telephone Encounter (Signed)
Patient returned call

## 2019-09-13 ENCOUNTER — Other Ambulatory Visit: Payer: Self-pay | Admitting: General Surgery

## 2019-09-13 ENCOUNTER — Encounter: Payer: Self-pay | Admitting: *Deleted

## 2019-09-13 DIAGNOSIS — C50311 Malignant neoplasm of lower-inner quadrant of right female breast: Secondary | ICD-10-CM

## 2019-09-13 NOTE — Telephone Encounter (Signed)
Spoke with patient. Advised of message as seen below from [place]. Patient is requesting that our surgery be performed before her surgery with Dr.Byerly on 10/04/2019. Asking if she could have this done potentially in January. Has many concerns about if our surgery is put on hold depending on her breast care after surgery on 10/04/2019 that this will further extend her recovery. Advised will review with Dr.Silva and review surgery scheduling and return call. Patient is agreeable.

## 2019-09-13 NOTE — Telephone Encounter (Signed)
Spoke with patient. Patient is aware I have spoken with [facility]. Unable to coordinate combined surgery. [facility] will call to schedule the patient at this time to proceed with her breast surgery scheduling as this is highest priority. Aware we will proceed with our surgery following her completion of her breast surgery which is scheduled on 10/04/2019. Patient is understandable and agreeable. Patient would like to know how long she will need to wait between her breast surgery and proceeding with surgery with Dr.Silva. Advised will review with Dr.Silva and return call.

## 2019-09-13 NOTE — Telephone Encounter (Signed)
We will need to let her oncology team define her post operative care before we can proceed with her gynecologic surgery.  For example, if she needs daily radiation therapy, this may to be completed first.

## 2019-09-14 ENCOUNTER — Telehealth: Payer: Self-pay | Admitting: Oncology

## 2019-09-14 NOTE — Telephone Encounter (Signed)
Scheduled per 1/13 sch msg. Called and left msg. Mailing printout  °

## 2019-09-14 NOTE — Telephone Encounter (Signed)
Spoke with patient. Advised I have spoken with Dr.Silva who does not recommend that we proceed with her [place] prior to her breast surgery. We do not want to risk any complications that may delay her breast surgery and care. Ensured the patient that while we understand this is very difficult it will be temporary and that we will do everything we can to help. Reassured patient. Patient verbalizes understanding and will be in touch following her breast surgery once a plan has been made with radiology and oncology.  Routing to provider and will close encounter.

## 2019-09-14 NOTE — Telephone Encounter (Signed)
Patient is asking to talk with [name] again regarding her surgery.

## 2019-09-18 ENCOUNTER — Encounter: Payer: Self-pay | Admitting: *Deleted

## 2019-09-19 ENCOUNTER — Telehealth: Payer: Self-pay | Admitting: Obstetrics and Gynecology

## 2019-09-19 NOTE — Telephone Encounter (Signed)
Spoke with patient. Patient states that her surgery with Dr.Byerly is set to proceed on 10/04/2019. Patient is unable to see oncology until 10/26/2019. Patient spoke with Dr.Byerly's nurse April who advised the patient that Dr.Byerly is okay with the patient proceeding with surgery with Dr.Silva following her breast surgery on 10/04/2019 and before her appointment with oncology. Patient would like to proceed in the 3 weeks between her surgery with Dr.Byerly and her consult with oncology so that she is not further delaying her care for 3 more weeks. Advised will review with Dr.Silva and return call. Patient is agreeable.

## 2019-09-19 NOTE — Telephone Encounter (Signed)
Ok to proceed with precerting and scheduling a total laparoscopic hysterectomy with bilateral salpingo-oophorectomy and collection of pelvic washings.   She has postmenopausal bleeding, fibroids, and a right ovarian cyst.   She will need a preop visit with me.

## 2019-09-19 NOTE — Telephone Encounter (Signed)
Thank you for the update!

## 2019-09-19 NOTE — Telephone Encounter (Signed)
Left detailed message for patient, okay per ROI. Advised reviewed with Dr.Silva and will proceed with precert for surgery. Advised she will receive a call to discuss and then we will proceed with scheduling. Advised to return call with any additional questions.

## 2019-09-19 NOTE — Telephone Encounter (Signed)
Patient calling to discuss surgery.

## 2019-09-21 ENCOUNTER — Ambulatory Visit: Payer: Commercial Managed Care - PPO | Admitting: Oncology

## 2019-09-21 ENCOUNTER — Other Ambulatory Visit: Payer: Commercial Managed Care - PPO

## 2019-09-21 NOTE — Telephone Encounter (Signed)
Call placed to patient to review benefits for recommended surgery. Patient acknowledges understanding of information presented. Patient is aware benefits presented are the professional benefits only, once surgery is scheduled she will be contacted by hospital with their benefit information. Patient is aware of the surgery cancellation policy. Patient is ready to moved forward with scheduled. Fowarding to Conservation officer, historic buildings for scheduling.  Routing to [employer], Therapist, sports

## 2019-09-21 NOTE — Telephone Encounter (Signed)
Spoke with patient. Advised reviewed surgery scheduling with Breast Navigator for [facility] who states proceeding with [facility] BSO will not delay breast care. Patient would like to proceed on 10/23/2019. Advised will request surgery at [facility] on 10/23/2019 and will return call with confirmation and surgery instructions. Patient is agreeable.

## 2019-09-22 NOTE — Telephone Encounter (Signed)
Spoke with patient. Advised surgery is scheduled for 10/23/2019 at [facility] 0730. Pre op scheduled for 10/12/2019 at 11:30 am with Dr.Silva. 1 week post op scheduled for 10/30/2019 at 2 pm with Dr.Silva. 6 week post op scheduled for 12/04/2019 at 2 pm with Dr.Silva. COVID testing scheduled for 10/19/2019 at 11 am at [facility] location. Patient is aware she will need to quarantine after COVID testing. Patient is agreeable to all dates and times. Surgery instructions reviewed and mailed to patient. Patient verbalizes understanding.  Cc: Lerry Liner  Routing to provider and will close encounter.

## 2019-09-26 ENCOUNTER — Other Ambulatory Visit: Payer: Self-pay

## 2019-09-26 ENCOUNTER — Encounter (HOSPITAL_BASED_OUTPATIENT_CLINIC_OR_DEPARTMENT_OTHER): Payer: Self-pay | Admitting: General Surgery

## 2019-09-27 ENCOUNTER — Encounter (HOSPITAL_BASED_OUTPATIENT_CLINIC_OR_DEPARTMENT_OTHER)
Admission: RE | Admit: 2019-09-27 | Discharge: 2019-09-27 | Disposition: A | Discharge status: Home / Self Care | Payer: Commercial Managed Care - PPO | Source: Ambulatory Visit | Attending: General Surgery | Admitting: General Surgery

## 2019-09-27 ENCOUNTER — Other Ambulatory Visit: Payer: Self-pay

## 2019-09-27 DIAGNOSIS — Z01812 Encounter for preprocedural laboratory examination: Secondary | ICD-10-CM | POA: Diagnosis not present

## 2019-09-27 NOTE — Progress Notes (Signed)

## 2019-09-27 NOTE — Progress Notes (Signed)
EKG from 2016 and today presented to Dr Lissa Hoard for overview. Will proceed with surgery as planned.

## 2019-09-30 ENCOUNTER — Other Ambulatory Visit (HOSPITAL_COMMUNITY)
Admission: RE | Admit: 2019-09-30 | Discharge: 2019-09-30 | Disposition: A | Discharge status: Home / Self Care | Payer: Commercial Managed Care - PPO | Source: Ambulatory Visit | Attending: General Surgery | Admitting: General Surgery

## 2019-09-30 DIAGNOSIS — Z01812 Encounter for preprocedural laboratory examination: Secondary | ICD-10-CM | POA: Insufficient documentation

## 2019-09-30 DIAGNOSIS — Z20822 Contact with and (suspected) exposure to covid-19: Secondary | ICD-10-CM | POA: Insufficient documentation

## 2019-09-30 LAB — SARS CORONAVIRUS 2 (TAT 6-24 HRS): SARS Coronavirus 2: NEGATIVE

## 2019-10-02 HISTORY — PX: BREAST LUMPECTOMY: SHX2
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
## 2019-10-03 ENCOUNTER — Ambulatory Visit
Admission: RE | Admit: 2019-10-03 | Discharge: 2019-10-03 | Disposition: A | Discharge status: Home / Self Care | Payer: Commercial Managed Care - PPO | Source: Ambulatory Visit | Attending: General Surgery | Admitting: General Surgery

## 2019-10-03 ENCOUNTER — Other Ambulatory Visit: Payer: Self-pay

## 2019-10-03 DIAGNOSIS — C50311 Malignant neoplasm of lower-inner quadrant of right female breast: Secondary | ICD-10-CM

## 2019-10-03 DIAGNOSIS — R928 Other abnormal and inconclusive findings on diagnostic imaging of breast: Secondary | ICD-10-CM

## 2019-10-03 IMAGING — MG MM PLC BREAST LOC DEV 1ST LESION INC*R*
8 of 9 series · 8 of 9 positions shown · non-contrast
Comparison: Previous exam(s).

CLINICAL DATA: Patient presents for seed localization of the RIGHT
breast prior to lumpectomy.

EXAM:
MAMMOGRAPHIC GUIDED RADIOACTIVE SEED LOCALIZATION OF THE RIGHT
BREAST x3 site

[R CC (1 of 3)]
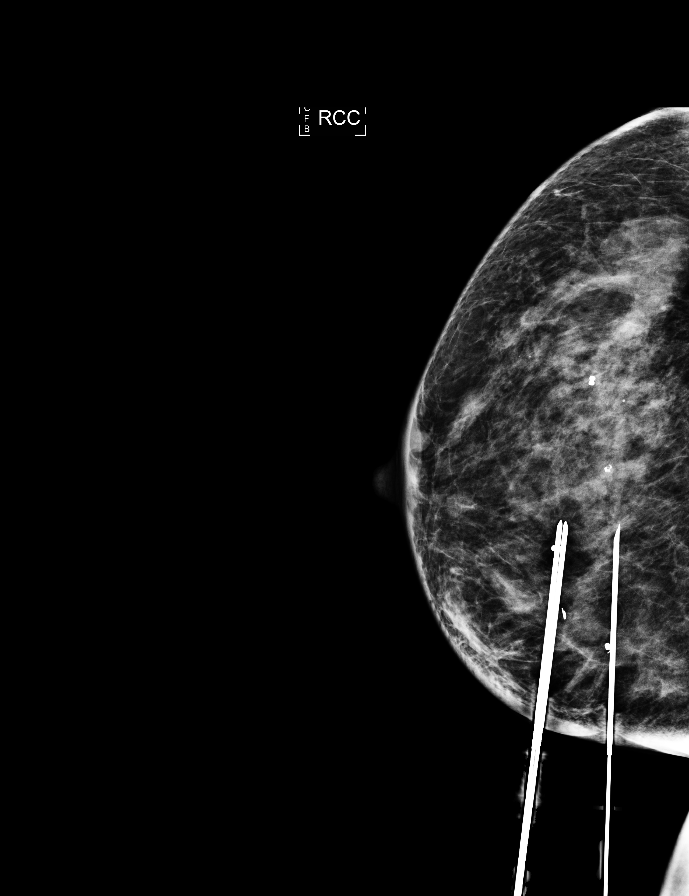

[R ML (1 of 5)]
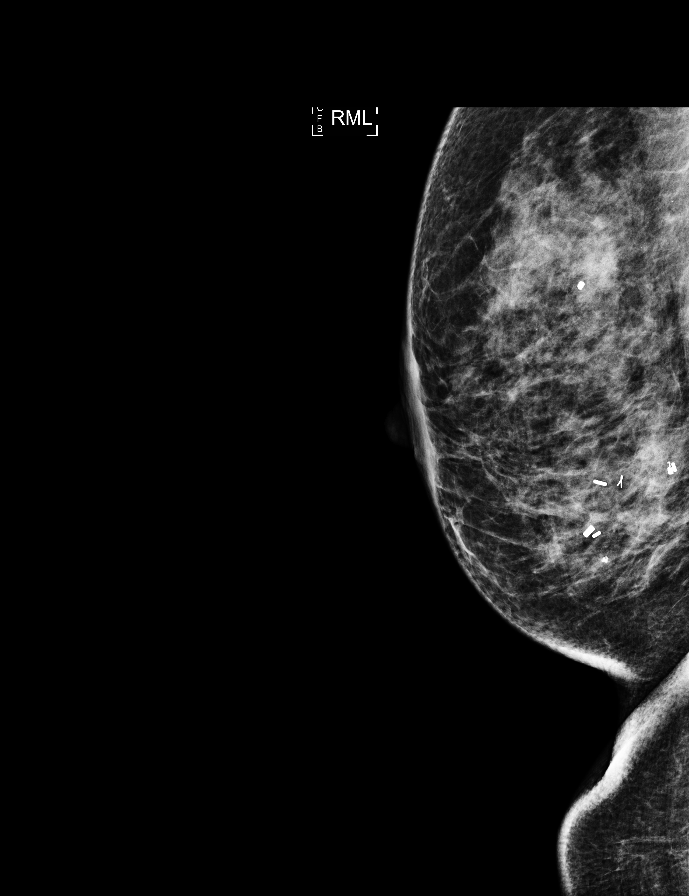

[R ML (2 of 5)]
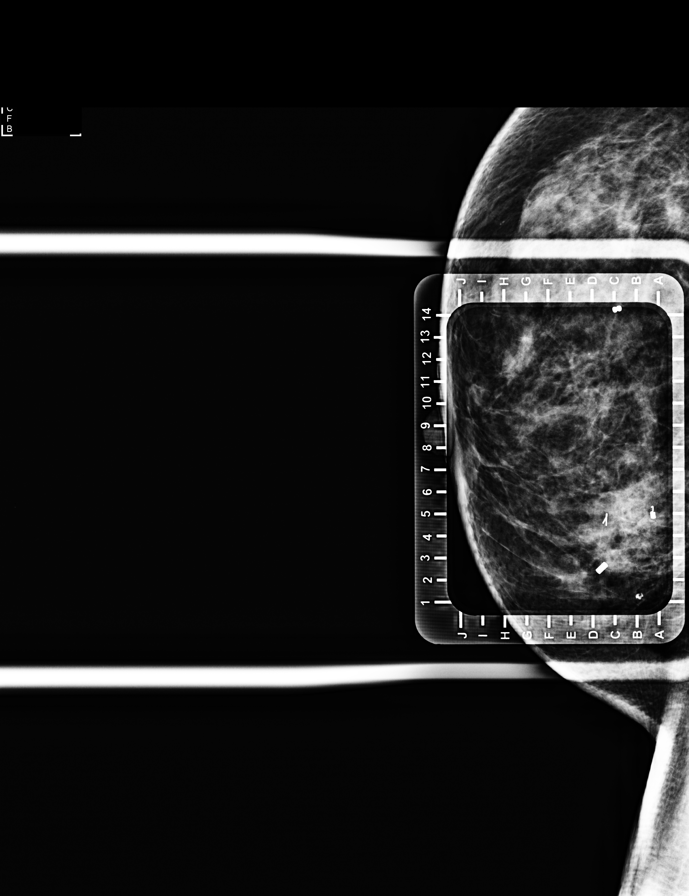

[R ML (3 of 5)]
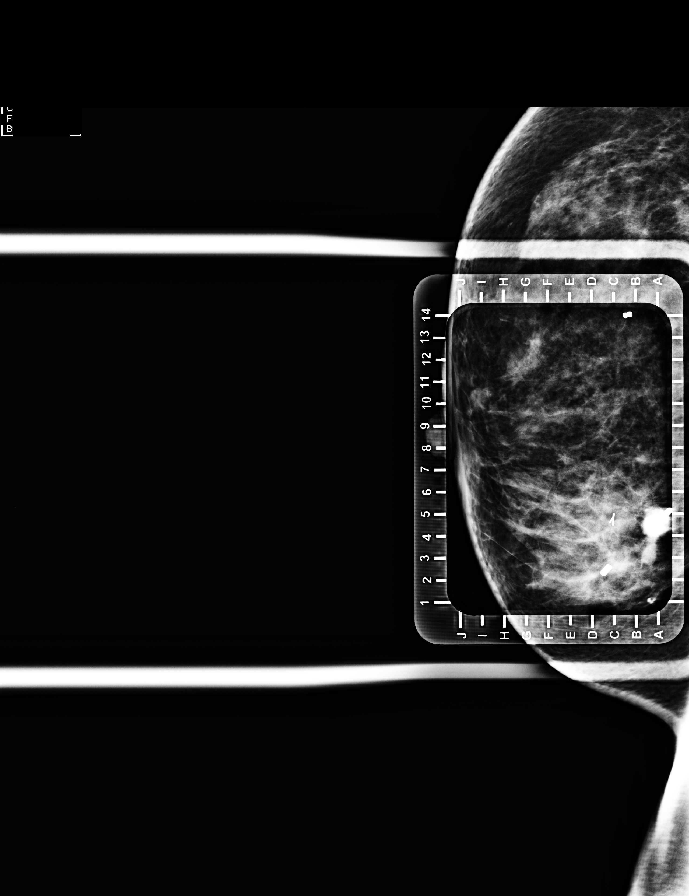

[R ML (4 of 5)]
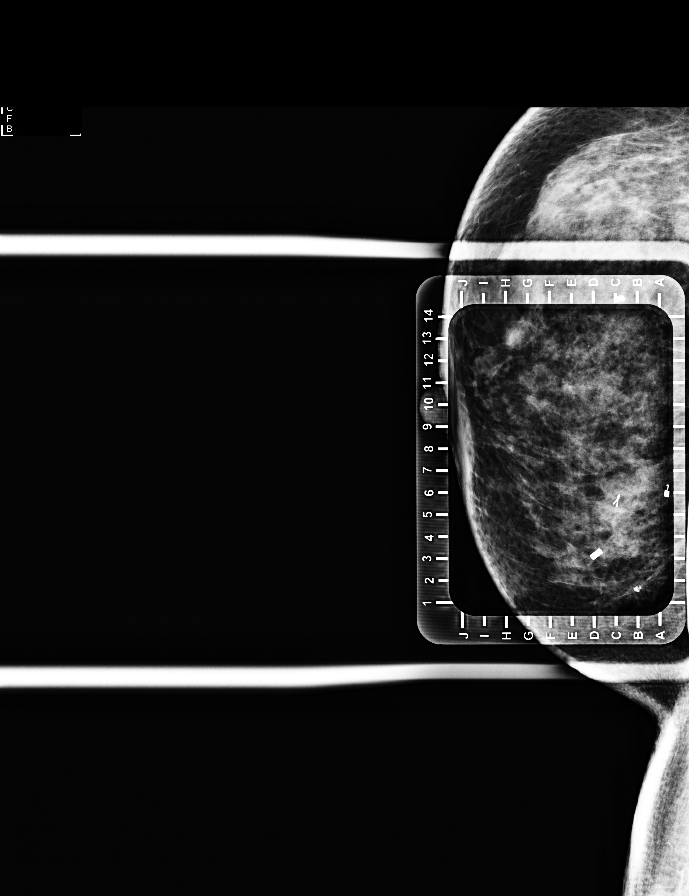

[R CC (2 of 3)]
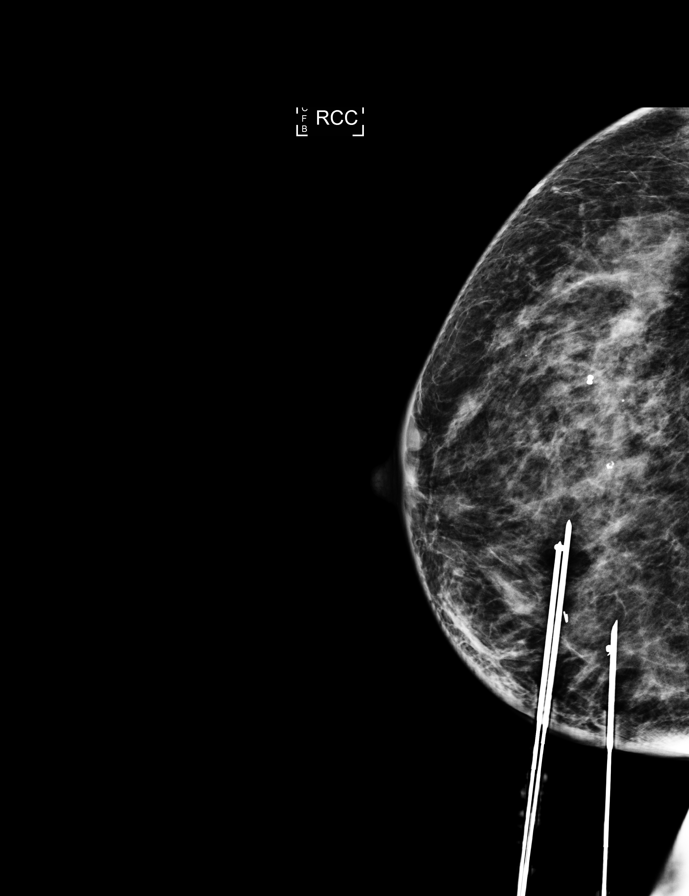

[R ML (5 of 5)]
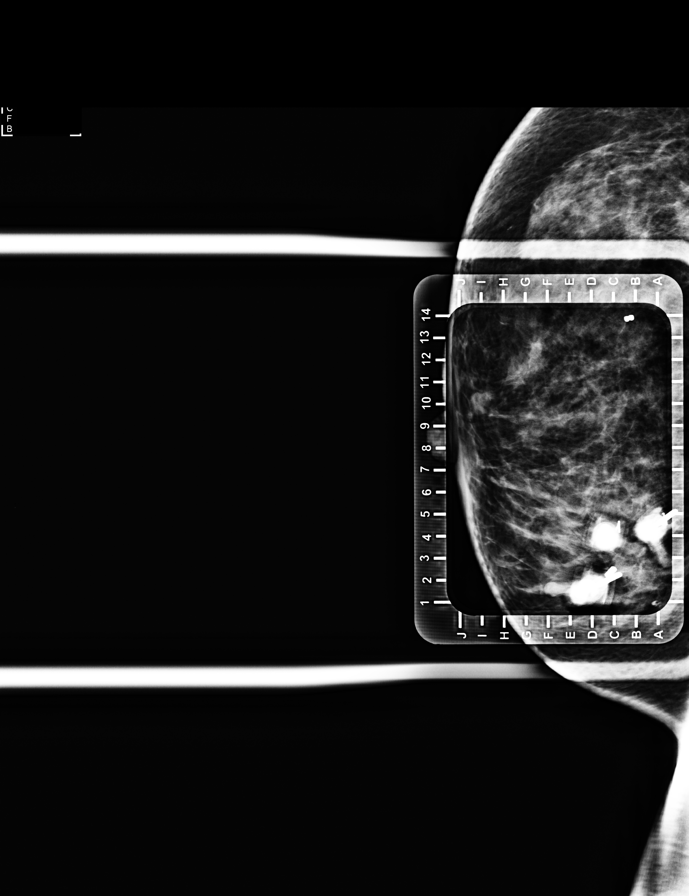

[R CC (3 of 3)]
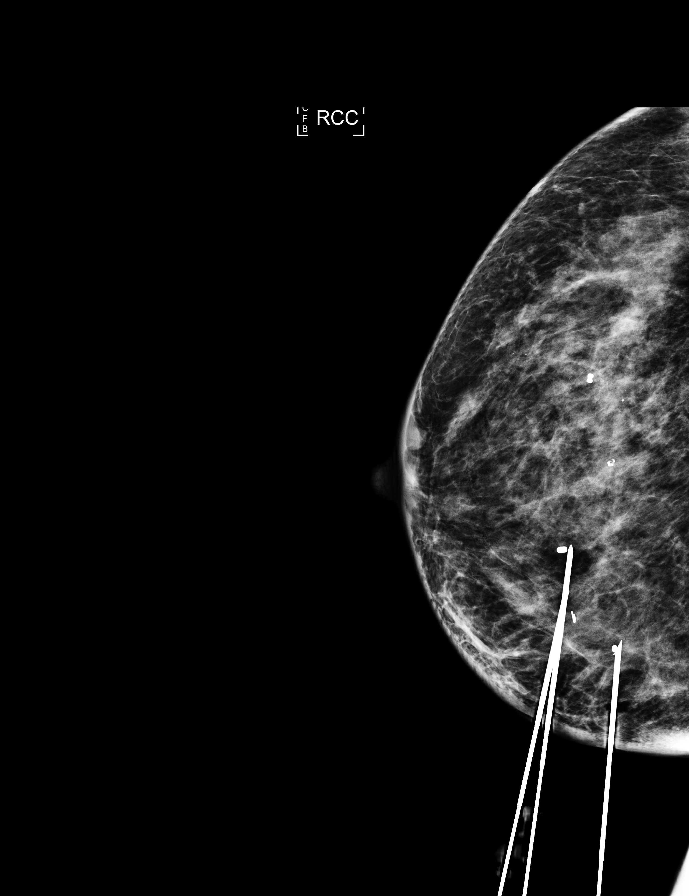

[8 of 9 positions shown; findings below may reference images not displayed]

FINDINGS: Patient presents for radioactive seed localization prior to
lumpectomy. I met with the patient and we discussed the procedure of
seed localization including benefits and alternatives. We discussed
the high likelihood of a successful procedure. We discussed the
risks of the procedure including infection, bleeding, tissue injury
and further surgery. We discussed the low dose of radioactivity
involved in the procedure. Informed, written consent was given.

The usual time-out protocol was performed immediately prior to the
procedure.

Using mammographic guidance, sterile technique, 1% lidocaine and an
[6I] radioactive seed, the coil shaped clip in the posterior LOWER
INNER QUADRANT of the RIGHT breast was localized using a MEDIAL to
LATERAL approach.

Follow-up survey of the patient confirms presence of the radioactive
seed.

Order number of [6I] seed:  [PHONE_NUMBER].

Total activity:  0.243 millicuries reference Date: [DATE]

Using mammographic guidance, sterile technique, 1% lidocaine and an
[6I] radioactive seed, ribbon shaped clip in the posterior LOWER
INNER QUADRANT of the RIGHT breast was localized using a MEDIAL to
LATERAL approach.

Follow-up survey of the patient confirms presence of the radioactive
seed.

Order number of [6I] seed:  [PHONE_NUMBER].

Total activity:  0.243 millicuries reference Date: [DATE]

Using mammographic guidance, sterile technique, 1% lidocaine and an
[6I] radioactive seed, the cylinder-shaped clip in the LOWER INNER
QUADRANT of the RIGHT breast was localized using a MEDIAL to LATERAL
approach.

Follow-up survey of the patient confirms presence of the radioactive
seed.

Order number of [6I] seed:  [PHONE_NUMBER].

Total activity:  0.243 millicuries reference Date: [DATE]

The follow-up mammogram images confirm the seed in the expected
location and were marked for Dr. CIS.

The patient tolerated the procedure well and was released from the
[REDACTED]. She was given instructions regarding seed removal.
IMPRESSION: Radioactive seed localization of 3 sites in the right breast. No
apparent complications.

## 2019-10-04 ENCOUNTER — Ambulatory Visit
Admission: RE | Admit: 2019-10-04 | Discharge: 2019-10-04 | Disposition: A | Discharge status: Home / Self Care | Payer: Commercial Managed Care - PPO | Source: Ambulatory Visit | Attending: General Surgery | Admitting: General Surgery

## 2019-10-04 ENCOUNTER — Encounter (HOSPITAL_BASED_OUTPATIENT_CLINIC_OR_DEPARTMENT_OTHER)
Admission: RE | Disposition: A | Discharge status: Home / Self Care | Payer: Self-pay | Source: Home / Self Care | Attending: General Surgery

## 2019-10-04 ENCOUNTER — Encounter (HOSPITAL_BASED_OUTPATIENT_CLINIC_OR_DEPARTMENT_OTHER): Payer: Self-pay | Admitting: General Surgery

## 2019-10-04 ENCOUNTER — Ambulatory Visit (HOSPITAL_BASED_OUTPATIENT_CLINIC_OR_DEPARTMENT_OTHER): Payer: Commercial Managed Care - PPO | Admitting: Certified Registered"

## 2019-10-04 ENCOUNTER — Ambulatory Visit (HOSPITAL_COMMUNITY)
Admission: RE | Admit: 2019-10-04 | Discharge: 2019-10-04 | Disposition: A | Discharge status: Home / Self Care | Payer: Commercial Managed Care - PPO | Source: Ambulatory Visit | Attending: General Surgery | Admitting: General Surgery

## 2019-10-04 ENCOUNTER — Ambulatory Visit (HOSPITAL_BASED_OUTPATIENT_CLINIC_OR_DEPARTMENT_OTHER)
Admission: RE | Admit: 2019-10-04 | Discharge: 2019-10-04 | Disposition: A | Discharge status: Home / Self Care | Payer: Commercial Managed Care - PPO | Attending: General Surgery | Admitting: General Surgery

## 2019-10-04 ENCOUNTER — Encounter: Payer: Self-pay | Admitting: Oncology

## 2019-10-04 DIAGNOSIS — C50311 Malignant neoplasm of lower-inner quadrant of right female breast: Secondary | ICD-10-CM

## 2019-10-04 DIAGNOSIS — C773 Secondary and unspecified malignant neoplasm of axilla and upper limb lymph nodes: Secondary | ICD-10-CM | POA: Diagnosis not present

## 2019-10-04 DIAGNOSIS — I10 Essential (primary) hypertension: Secondary | ICD-10-CM | POA: Insufficient documentation

## 2019-10-04 DIAGNOSIS — Z170 Estrogen receptor positive status [ER+]: Secondary | ICD-10-CM | POA: Insufficient documentation

## 2019-10-04 DIAGNOSIS — Z800 Family history of malignant neoplasm of digestive organs: Secondary | ICD-10-CM | POA: Diagnosis not present

## 2019-10-04 DIAGNOSIS — Z85820 Personal history of malignant melanoma of skin: Secondary | ICD-10-CM | POA: Insufficient documentation

## 2019-10-04 DIAGNOSIS — Z87891 Personal history of nicotine dependence: Secondary | ICD-10-CM | POA: Diagnosis not present

## 2019-10-04 DIAGNOSIS — I491 Atrial premature depolarization: Secondary | ICD-10-CM | POA: Diagnosis not present

## 2019-10-04 DIAGNOSIS — Z79899 Other long term (current) drug therapy: Secondary | ICD-10-CM | POA: Diagnosis not present

## 2019-10-04 DIAGNOSIS — Z86010 Personal history of colonic polyps: Secondary | ICD-10-CM | POA: Diagnosis not present

## 2019-10-04 HISTORY — DX: Nausea with vomiting, unspecified: R11.2

## 2019-10-04 HISTORY — DX: Other specified postprocedural states: Z98.890

## 2019-10-04 HISTORY — PX: BREAST LUMPECTOMY WITH RADIOACTIVE SEED AND SENTINEL LYMPH NODE BIOPSY: SHX6550

## 2019-10-04 HISTORY — PX: MASTOPEXY: SHX5358

## 2019-10-04 IMAGING — DX MM BREAST SURGICAL SPECIMEN
1 series · 2 of 2 positions shown · non-contrast
Comparison: Previous exam(s).

CLINICAL DATA: Status post seed localized lumpectomy of 3 sites in
the RIGHT breast.

EXAM:
SPECIMEN RADIOGRAPH OF THE RIGHT BREAST

[Series 2: specimen digital x-ray, derived · right · 0.10mm/px · 2 of 2 slices shown]
[im 1/2]
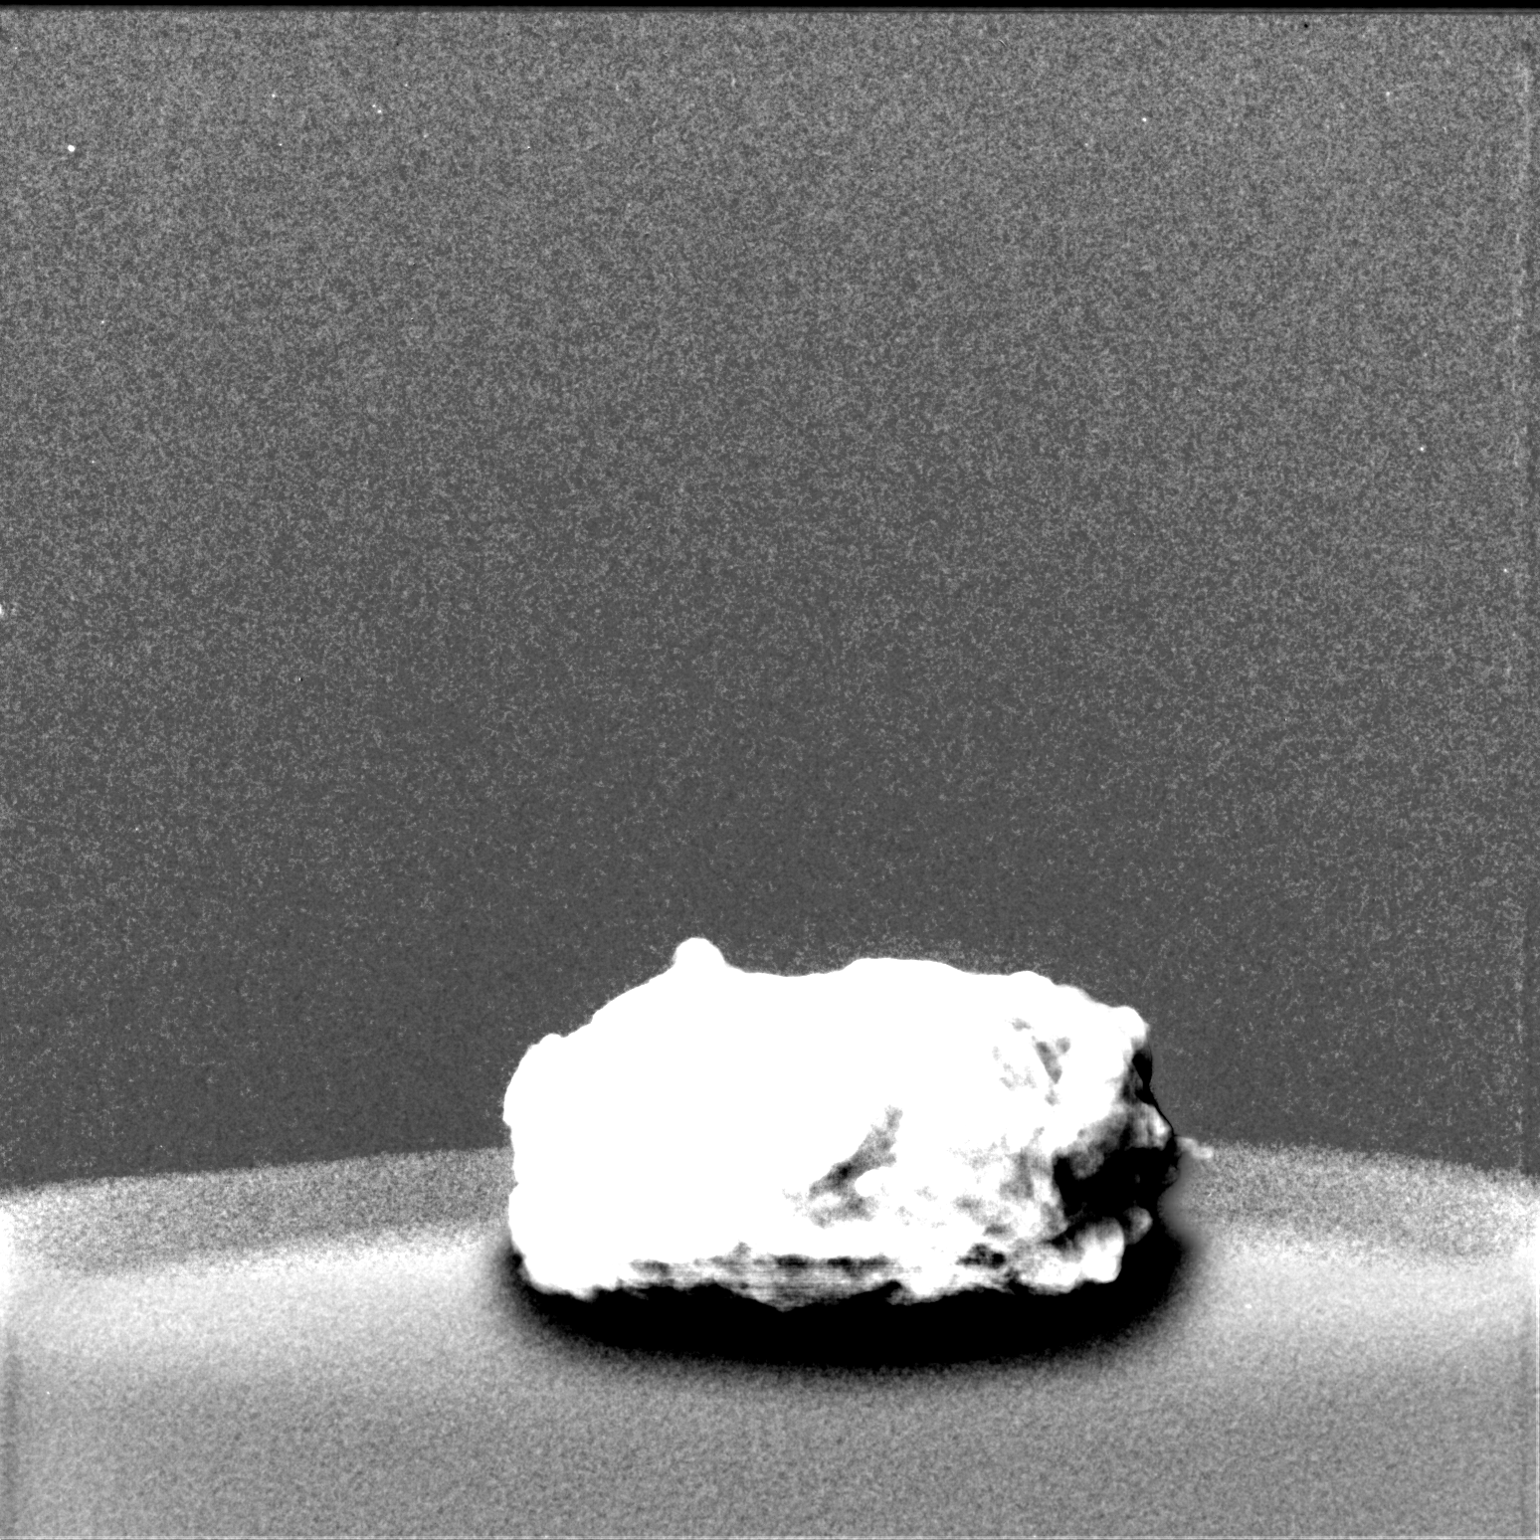
[im 2/2]
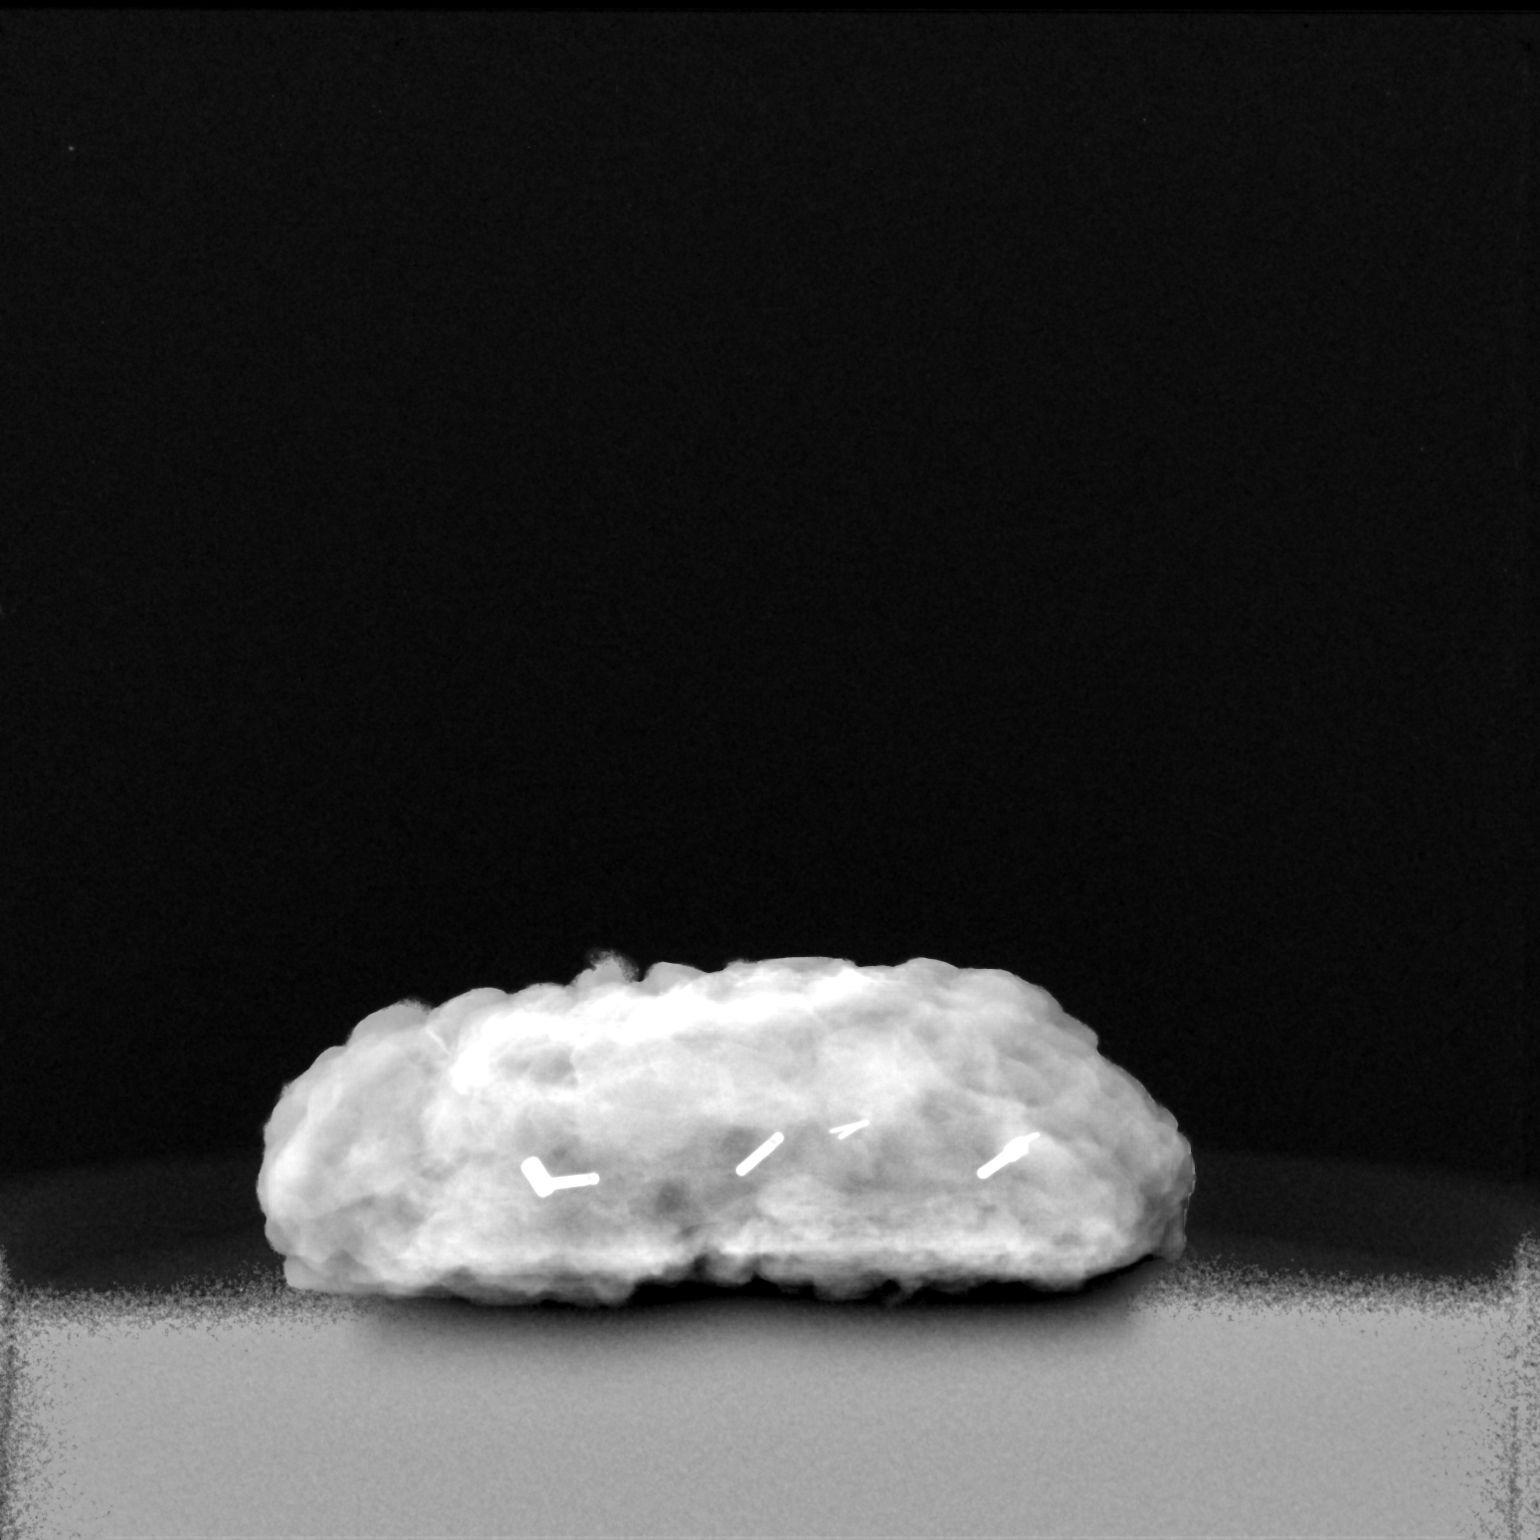

[2 of 2 positions shown; findings below may reference images not displayed]

FINDINGS: Status post excision of the right breast. Three radioactive seeds
are present. Cylinder, coil, and ribbon shaped clips are present.
The findings are discussed with the operating room nurse at the time
of interpretation.
IMPRESSION: Specimen radiograph of the right breast.

## 2019-10-04 SURGERY — BREAST LUMPECTOMY WITH RADIOACTIVE SEED AND SENTINEL LYMPH NODE BIOPSY
Anesthesia: General | Site: Breast | Laterality: Right

## 2019-10-04 MED ORDER — FENTANYL CITRATE (PF) 100 MCG/2ML IJ SOLN
INTRAMUSCULAR | Status: AC
  Filled 2019-10-04: qty 2

## 2019-10-04 MED ORDER — PROPOFOL 10 MG/ML IV BOLUS
INTRAVENOUS | Status: DC | PRN
  Administered 2019-10-04: 150 mg via INTRAVENOUS

## 2019-10-04 MED ORDER — OXYCODONE HCL 5 MG/5ML PO SOLN: 5.0000 mg | Freq: Once | ORAL | Status: AC | PRN

## 2019-10-04 MED ORDER — SCOPOLAMINE 1 MG/3DAYS TD PT72
MEDICATED_PATCH | TRANSDERMAL | Status: AC
  Filled 2019-10-04: qty 1

## 2019-10-04 MED ORDER — BUPIVACAINE HCL (PF) 0.25 % IJ SOLN
INTRAMUSCULAR | Status: DC | PRN
  Administered 2019-10-04: 15 mL

## 2019-10-04 MED ORDER — LACTATED RINGERS IV SOLN: INTRAVENOUS | Status: DC

## 2019-10-04 MED ORDER — ACETAMINOPHEN 500 MG PO TABS
ORAL_TABLET | ORAL | Status: AC
  Filled 2019-10-04: qty 2

## 2019-10-04 MED ORDER — ONDANSETRON HCL 4 MG/2ML IJ SOLN
INTRAMUSCULAR | Status: AC
  Filled 2019-10-04: qty 2

## 2019-10-04 MED ORDER — LIDOCAINE-EPINEPHRINE 0.5 %-1:200000 IJ SOLN
INTRAMUSCULAR | Status: DC | PRN
  Administered 2019-10-04: 15 mL

## 2019-10-04 MED ORDER — OXYCODONE HCL 5 MG PO TABS
5.0000 mg | ORAL_TABLET | Freq: Once | ORAL | Status: AC | PRN
  Administered 2019-10-04: 5 mg via ORAL

## 2019-10-04 MED ORDER — GABAPENTIN 100 MG PO CAPS
ORAL_CAPSULE | ORAL | Status: AC
  Filled 2019-10-04: qty 1

## 2019-10-04 MED ORDER — METHYLENE BLUE 0.5 % INJ SOLN
INTRAVENOUS | Status: AC
  Filled 2019-10-04: qty 10

## 2019-10-04 MED ORDER — CHLORHEXIDINE GLUCONATE CLOTH 2 % EX PADS: 6.0000 | MEDICATED_PAD | Freq: Once | CUTANEOUS | Status: DC

## 2019-10-04 MED ORDER — DEXAMETHASONE SODIUM PHOSPHATE 10 MG/ML IJ SOLN
INTRAMUSCULAR | Status: DC | PRN
  Administered 2019-10-04: 5 mg via INTRAVENOUS

## 2019-10-04 MED ORDER — PHENYLEPHRINE 40 MCG/ML (10ML) SYRINGE FOR IV PUSH (FOR BLOOD PRESSURE SUPPORT)
PREFILLED_SYRINGE | INTRAVENOUS | Status: DC | PRN
  Administered 2019-10-04: 40 ug via INTRAVENOUS
  Administered 2019-10-04: 80 ug via INTRAVENOUS

## 2019-10-04 MED ORDER — GABAPENTIN 100 MG PO CAPS
100.0000 mg | ORAL_CAPSULE | ORAL | Status: AC
  Administered 2019-10-04: 100 mg via ORAL

## 2019-10-04 MED ORDER — CEFAZOLIN SODIUM-DEXTROSE 2-4 GM/100ML-% IV SOLN
2.0000 g | INTRAVENOUS | Status: AC
  Administered 2019-10-04: 2 g via INTRAVENOUS

## 2019-10-04 MED ORDER — SCOPOLAMINE 1 MG/3DAYS TD PT72
1.0000 | MEDICATED_PATCH | TRANSDERMAL | Status: DC
  Administered 2019-10-04: 1.5 mg via TRANSDERMAL

## 2019-10-04 MED ORDER — OXYCODONE HCL 5 MG PO TABS
ORAL_TABLET | ORAL | Status: AC
  Filled 2019-10-04: qty 1

## 2019-10-04 MED ORDER — FENTANYL CITRATE (PF) 100 MCG/2ML IJ SOLN: 25.0000 ug | INTRAMUSCULAR | Status: DC | PRN

## 2019-10-04 MED ORDER — CEFAZOLIN SODIUM-DEXTROSE 2-4 GM/100ML-% IV SOLN
INTRAVENOUS | Status: AC
  Filled 2019-10-04: qty 100

## 2019-10-04 MED ORDER — ONDANSETRON HCL 4 MG/2ML IJ SOLN
INTRAMUSCULAR | Status: DC | PRN
  Administered 2019-10-04: 4 mg via INTRAVENOUS

## 2019-10-04 MED ORDER — LIDOCAINE 2% (20 MG/ML) 5 ML SYRINGE
INTRAMUSCULAR | Status: DC | PRN
  Administered 2019-10-04: 1000 mg via INTRAVENOUS

## 2019-10-04 MED ORDER — ENSURE PRE-SURGERY PO LIQD: 296.0000 mL | Freq: Once | ORAL | Status: DC

## 2019-10-04 MED ORDER — BUPIVACAINE-EPINEPHRINE (PF) 0.5% -1:200000 IJ SOLN
INTRAMUSCULAR | Status: DC | PRN
  Administered 2019-10-04: 30 mL

## 2019-10-04 MED ORDER — OXYCODONE HCL 5 MG PO TABS: 5.0000 mg | ORAL_TABLET | Freq: Four times a day (QID) | ORAL | 0 refills | Status: DC | PRN

## 2019-10-04 MED ORDER — MIDAZOLAM HCL 2 MG/2ML IJ SOLN
INTRAMUSCULAR | Status: AC
  Filled 2019-10-04: qty 2

## 2019-10-04 MED ORDER — FENTANYL CITRATE (PF) 100 MCG/2ML IJ SOLN
50.0000 ug | INTRAMUSCULAR | Status: DC | PRN
  Administered 2019-10-04: 25 ug via INTRAVENOUS
  Administered 2019-10-04: 50 ug via INTRAVENOUS

## 2019-10-04 MED ORDER — PROPOFOL 500 MG/50ML IV EMUL
INTRAVENOUS | Status: DC | PRN
  Administered 2019-10-04: 25 ug/kg/min via INTRAVENOUS

## 2019-10-04 MED ORDER — ACETAMINOPHEN 500 MG PO TABS
1000.0000 mg | ORAL_TABLET | ORAL | Status: AC
  Administered 2019-10-04: 1000 mg via ORAL

## 2019-10-04 MED ORDER — EPHEDRINE SULFATE 50 MG/ML IJ SOLN
INTRAMUSCULAR | Status: DC | PRN
  Administered 2019-10-04: 15 mg via INTRAVENOUS
  Administered 2019-10-04: 5 mg via INTRAVENOUS

## 2019-10-04 MED ORDER — PROMETHAZINE HCL 25 MG/ML IJ SOLN: 6.2500 mg | INTRAMUSCULAR | Status: DC | PRN

## 2019-10-04 MED ORDER — MIDAZOLAM HCL 2 MG/2ML IJ SOLN
1.0000 mg | INTRAMUSCULAR | Status: DC | PRN
  Administered 2019-10-04: 2 mg via INTRAVENOUS

## 2019-10-04 MED ORDER — FENTANYL CITRATE (PF) 100 MCG/2ML IJ SOLN
25.0000 ug | INTRAMUSCULAR | Status: DC | PRN
  Administered 2019-10-04: 50 ug via INTRAVENOUS

## 2019-10-04 MED ORDER — SODIUM CHLORIDE (PF) 0.9 % IJ SOLN
INTRAMUSCULAR | Status: AC
  Filled 2019-10-04: qty 10

## 2019-10-04 SURGICAL SUPPLY — 62 items
BINDER BREAST LRG (GAUZE/BANDAGES/DRESSINGS) IMPLANT
BINDER BREAST MEDIUM (GAUZE/BANDAGES/DRESSINGS) IMPLANT
BLADE SURG 10 STRL SS (BLADE) ×2 IMPLANT
BLADE SURG 15 STRL LF DISP TIS (BLADE) ×2 IMPLANT
BLADE SURG 15 STRL SS (BLADE) ×2
BNDG COHESIVE 4X5 TAN STRL (GAUZE/BANDAGES/DRESSINGS) ×2 IMPLANT
CANISTER SUC SOCK COL 7IN (MISCELLANEOUS) IMPLANT
CANISTER SUCT 1200ML W/VALVE (MISCELLANEOUS) ×2 IMPLANT
CHLORAPREP W/TINT 26 (MISCELLANEOUS) ×2 IMPLANT
CLIP VESOCCLUDE LG 6/CT (CLIP) ×2 IMPLANT
CLIP VESOCCLUDE MED 6/CT (CLIP) ×2 IMPLANT
COVER BACK TABLE 60X90IN (DRAPES) ×2 IMPLANT
COVER MAYO STAND STRL (DRAPES) ×4 IMPLANT
COVER PROBE W GEL 5X96 (DRAPES) ×2 IMPLANT
DECANTER SPIKE VIAL GLASS SM (MISCELLANEOUS) IMPLANT
DERMABOND ADVANCED (GAUZE/BANDAGES/DRESSINGS) ×1
DERMABOND ADVANCED .7 DNX12 (GAUZE/BANDAGES/DRESSINGS) ×1 IMPLANT
DRAPE UTILITY XL STRL (DRAPES) ×2 IMPLANT
DRSG PAD ABDOMINAL 8X10 ST (GAUZE/BANDAGES/DRESSINGS) IMPLANT
ELECT BLADE 4.0 EZ CLEAN MEGAD (MISCELLANEOUS) ×2
ELECT REM PT RETURN 9FT ADLT (ELECTROSURGICAL) ×2
ELECTRODE BLDE 4.0 EZ CLN MEGD (MISCELLANEOUS) ×1 IMPLANT
ELECTRODE REM PT RTRN 9FT ADLT (ELECTROSURGICAL) ×1 IMPLANT
GAUZE SPONGE 4X4 12PLY STRL LF (GAUZE/BANDAGES/DRESSINGS) IMPLANT
GLOVE BIO SURGEON STRL SZ 6 (GLOVE) ×2 IMPLANT
GLOVE BIO SURGEON STRL SZ 6.5 (GLOVE) ×4 IMPLANT
GLOVE BIOGEL PI IND STRL 6.5 (GLOVE) ×3 IMPLANT
GLOVE BIOGEL PI INDICATOR 6.5 (GLOVE) ×3
GOWN STRL REUS W/ TWL LRG LVL3 (GOWN DISPOSABLE) ×2 IMPLANT
GOWN STRL REUS W/TWL 2XL LVL3 (GOWN DISPOSABLE) ×2 IMPLANT
GOWN STRL REUS W/TWL LRG LVL3 (GOWN DISPOSABLE) ×2
KIT MARKER MARGIN INK (KITS) ×2 IMPLANT
LIGHT WAVEGUIDE WIDE FLAT (MISCELLANEOUS) ×2 IMPLANT
NDL SAFETY ECLIPSE 18X1.5 (NEEDLE) IMPLANT
NEEDLE HYPO 18GX1.5 SHARP (NEEDLE)
NEEDLE HYPO 25X1 1.5 SAFETY (NEEDLE) ×2 IMPLANT
NS IRRIG 1000ML POUR BTL (IV SOLUTION) ×2 IMPLANT
PACK BASIN DAY SURGERY FS (CUSTOM PROCEDURE TRAY) ×2 IMPLANT
PACK UNIVERSAL I (CUSTOM PROCEDURE TRAY) ×2 IMPLANT
PENCIL SMOKE EVACUATOR (MISCELLANEOUS) ×2 IMPLANT
SLEEVE SCD COMPRESS KNEE MED (MISCELLANEOUS) ×2 IMPLANT
SPONGE LAP 18X18 RF (DISPOSABLE) ×2 IMPLANT
STAPLER VISISTAT 35W (STAPLE) IMPLANT
STOCKINETTE IMPERVIOUS LG (DRAPES) ×2 IMPLANT
STRIP CLOSURE SKIN 1/2X4 (GAUZE/BANDAGES/DRESSINGS) IMPLANT
SUT ETHILON 2 0 FS 18 (SUTURE) IMPLANT
SUT MNCRL AB 4-0 PS2 18 (SUTURE) ×4 IMPLANT
SUT MON AB 4-0 PC3 18 (SUTURE) IMPLANT
SUT MON AB 5-0 PS2 18 (SUTURE) IMPLANT
SUT SILK 2 0 SH (SUTURE) IMPLANT
SUT VIC AB 2-0 SH 18 (SUTURE) IMPLANT
SUT VIC AB 2-0 SH 27 (SUTURE)
SUT VIC AB 2-0 SH 27XBRD (SUTURE) IMPLANT
SUT VIC AB 3-0 SH 27 (SUTURE)
SUT VIC AB 3-0 SH 27X BRD (SUTURE) IMPLANT
SUT VICRYL 3-0 CR8 SH (SUTURE) ×4 IMPLANT
SYR BULB 3OZ (MISCELLANEOUS) IMPLANT
SYR CONTROL 10ML LL (SYRINGE) ×2 IMPLANT
TOWEL GREEN STERILE FF (TOWEL DISPOSABLE) ×2 IMPLANT
TRAY FAXITRON CT DISP (TRAY / TRAY PROCEDURE) ×2 IMPLANT
TUBE CONNECTING 20X1/4 (TUBING) ×2 IMPLANT
YANKAUER SUCT BULB TIP NO VENT (SUCTIONS) ×2 IMPLANT

## 2019-10-04 NOTE — Anesthesia Procedure Notes (Signed)
Procedure Name: LMA Insertion Date/Time: 10/04/2019 10:44 AM Performed by: Gwyndolyn Saxon, CRNA Pre-anesthesia Checklist: Patient identified, Emergency Drugs available, Suction available and Patient being monitored Patient Re-evaluated:Patient Re-evaluated prior to induction Oxygen Delivery Method: Circle System Utilized Preoxygenation: Pre-oxygenation with 100% oxygen Induction Type: IV induction Ventilation: Mask ventilation without difficulty LMA: LMA inserted LMA Size: 4.0 Number of attempts: 1 Airway Equipment and Method: Bite block Placement Confirmation: positive ETCO2 Tube secured with: Tape Dental Injury: Teeth and Oropharynx as per pre-operative assessment

## 2019-10-04 NOTE — Anesthesia Postprocedure Evaluation (Signed)
Anesthesia Post Note  Patient: ZERELDA PTACEK  Procedure(s) Performed: RIGHT BREAST LUMPECTOMY WITH BRACKETED RADIOACTIVE SEEDS, RIGHT BREAST RADIOACTIVE SEED GUIDED EXCISION BIOPSY, AND RIGHT SENTINEL LYMPH NODE BIOPSY (Right Breast) RIGHT BREAST MASTOPEXY (Right Breast)     Patient location during evaluation: PACU Anesthesia Type: General Level of consciousness: awake and alert and oriented Pain management: pain level controlled Vital Signs Assessment: post-procedure vital signs reviewed and stable Respiratory status: spontaneous breathing, nonlabored ventilation and respiratory function stable Cardiovascular status: blood pressure returned to baseline Postop Assessment: no apparent nausea or vomiting Anesthetic complications: no    Last Vitals:  Vitals:   10/04/19 1245 10/04/19 1300  BP: 111/65 113/65  Pulse: 70 62  Resp: 14 14  Temp:    SpO2: 94% 96%    Last Pain:  Vitals:   10/04/19 1300  TempSrc:   PainSc: 3                  Brennan Bailey

## 2019-10-04 NOTE — Anesthesia Preprocedure Evaluation (Signed)
Anesthesia Evaluation  Patient identified by MRN, date of birth, ID band Patient awake    Reviewed: Allergy & Precautions, NPO status , Patient's Chart, lab work & pertinent test results  History of Anesthesia Complications (+) PONV  Airway Mallampati: I  TM Distance: >3 FB Neck ROM: Full    Dental   Pulmonary former smoker,    Pulmonary exam normal        Cardiovascular hypertension, Pt. on medications Normal cardiovascular exam     Neuro/Psych    GI/Hepatic   Endo/Other    Renal/GU      Musculoskeletal   Abdominal   Peds  Hematology   Anesthesia Other Findings   Reproductive/Obstetrics                             Anesthesia Physical Anesthesia Plan  ASA: II  Anesthesia Plan: General   Post-op Pain Management:  Regional for Post-op pain   Induction: Intravenous  PONV Risk Score and Plan: Ondansetron, Dexamethasone, Midazolam and Scopolamine patch - Pre-op  Airway Management Planned: LMA  Additional Equipment:   Intra-op Plan:   Post-operative Plan: Extubation in OR  Informed Consent: I have reviewed the patients History and Physical, chart, labs and discussed the procedure including the risks, benefits and alternatives for the proposed anesthesia with the patient or authorized representative who has indicated his/her understanding and acceptance.       Plan Discussed with: CRNA and Surgeon  Anesthesia Plan Comments:         Anesthesia Quick Evaluation

## 2019-10-04 NOTE — Op Note (Signed)
Right Breast Radioactive seed bracketed lumpectomy, mastopexy, right breast seed localized excisional biopsy and sentinel lymph node biopsy  Indications: This patient presents with history of right breast cancer, and abnormal right breast MR with discordant biopsy  Pre-operative Diagnosis: right breast cancer, cT1-2 N0, LIQ, invasive mammary carcinoma (ductal phenotype), +/-/-, Ki 67 5%.  Mammogram/ultrasound demonstrated two cancers close together, but on MRI, the masses appeared confluent  Post-operative Diagnosis: right breast cancer, as above  Surgeon: Stark Klein   Asst:  Carlena Hurl, PA-C  Anesthesia: General endotracheal anesthesia  ASA Class: 2  Procedure Details  The patient was seen in the Holding Room. The risks, benefits, complications, treatment options, and expected outcomes were discussed with the patient. The possibilities of bleeding, infection, the need for additional procedures, failure to diagnose a condition, and creating a complication requiring transfusion or operation were discussed with the patient. The patient concurred with the proposed plan, giving informed consent.  The site of surgery properly noted/marked. The patient was taken to Operating Room # 8, identified, and the procedure verified as right breast seed bracketed lumpectomy, right breast seed localized excisional biopsy, and sentinel lymph node biopsy. A Time Out was held and the above information confirmed.  The right arm, breast, and chest were prepped and draped in standard fashion. The lumpectomy was performed by creating an inferomedial circumareolar incision near the previously placed radioactive seeds.  Dissection was carried down to around the point of maximum signal intensity. The cautery was used to perform the dissection.  All three seeds were close enough to take together.  Hemostasis was achieved with cautery. The edges of the cavity were marked with large clips.   The specimen was inked with the  margin marker paint kit.    Specimen radiography confirmed inclusion of the mammographic lesion, the clip, and the seed.  The background signal in the breast was zero.  She had a significant defect in the breast medially where it was quite apparent.  A mastopexy was performed by rearranging the adjacent breast tissue to help fill in the defect.  This was performed with cautery and then 2-0 vicryl sutures.  The wound was irrigated and closed with 3-0 vicryl in layers and 4-0 monocryl subcuticular suture.    Using a hand-held gamma probe, right axillary sentinel nodes were identified transcutaneously.  An oblique incision was created below the axillary hairline.  Dissection was carried through the clavipectoral fascia.  Three deep  level 2 axillary sentinel nodes were removed.  Counts per second were listed below.    The background count was 5 cps.  The wound was irrigated.  Hemostasis was achieved with cautery.  The axillary incision was closed with a 3-0 vicryl deep dermal interrupted sutures and a 4-0 monocryl subcuticular closure.    Sterile dressings were applied. At the end of the operation, all sponge, instrument, and needle counts were correct.  Findings: grossly clear surgical margins and no adenopathy.  Anterior margin is skin, posterior margin is pectoralis, medial margin is sternal border, inferior margin is inframammary fold.  SLN #1 cps 2700, SLN #2 cps 61, SLN #2 cps 150.  Background count is 5.    Estimated Blood Loss:  min         Specimens: right breast lumpectomy and three right axillary sentinel lymph nodes.             Complications:  None; patient tolerated the procedure well.         Disposition: PACU - hemodynamically  stable.         Condition: stable

## 2019-10-04 NOTE — Transfer of Care (Signed)
Immediate Anesthesia Transfer of Care Note  Patient: Theresa [name]  Procedure(s) Performed: RIGHT BREAST LUMPECTOMY WITH BRACKETED RADIOACTIVE SEEDS, RIGHT BREAST RADIOACTIVE SEED GUIDED EXCISION BIOPSY, AND RIGHT SENTINEL LYMPH NODE BIOPSY (Right Breast) RIGHT BREAST MASTOPEXY (Right Breast)  Patient Location: PACU  Anesthesia Type:General and Regional  Level of Consciousness: drowsy  Airway & Oxygen Therapy: Patient Spontanous Breathing and Patient connected to face mask oxygen  Post-op Assessment: Report given to RN and Post -op Vital signs reviewed and stable  Post vital signs: Reviewed and stable  Last Vitals:  Vitals Value Taken Time  BP 104/63 10/04/19 1206  Temp    Pulse 74 10/04/19 1209  Resp 19 10/04/19 1209  SpO2 100 % 10/04/19 1209  Vitals shown include unvalidated device data.  Last Pain:  Vitals:   10/04/19 0917  TempSrc: Temporal  PainSc: 0-No pain         Complications: No apparent anesthesia complications

## 2019-10-04 NOTE — Discharge Instructions (Addendum)
[place] Office Phone Number [phone]  BREAST BIOPSY/ PARTIAL MASTECTOMY: POST OP INSTRUCTIONS  Always review your discharge instruction sheet given to you by the facility where your surgery was performed.  IF YOU HAVE DISABILITY OR FAMILY LEAVE FORMS, YOU MUST BRING THEM TO THE OFFICE FOR PROCESSING.  DO NOT GIVE THEM TO YOUR DOCTOR.  1. A prescription for pain medication may be given to you upon discharge.  Take your pain medication as prescribed, if needed.  If narcotic pain medicine is not needed, then you may take acetaminophen (Tylenol) or ibuprofen (Advil) as needed. 2. Take your usually prescribed medications unless otherwise directed 3. If you need a refill on your pain medication, please contact your pharmacy.  They will contact our office to request authorization.  Prescriptions will not be filled after 5pm or on week-ends. 4. You should eat very light the first 24 hours after surgery, such as soup, crackers, pudding, etc.  Resume your normal diet the day after surgery. 5. Most patients will experience some swelling and bruising in the breast.  Ice packs and a good support bra will help.  Swelling and bruising can take several days to resolve.  6. It is common to experience some constipation if taking pain medication after surgery.  Increasing fluid intake and taking a stool softener will usually help or prevent this problem from occurring.  A mild laxative (Milk of Magnesia or Miralax) should be taken according to package directions if there are no bowel movements after 48 hours. 7. Unless discharge instructions indicate otherwise, you may remove your bandages 48 hours after surgery, and you may shower at that time.  You may have steri-strips (small skin tapes) in place directly over the incision.  These strips should be left on the skin for 7-10 days.   Any sutures or staples will be removed at the office during your follow-up visit. 8. ACTIVITIES:  You may resume  regular daily activities (gradually increasing) beginning the next day.  Wearing a good support bra or sports bra (or the breast binder) minimizes pain and swelling.  You may have sexual intercourse when it is comfortable. a. You may drive when you no longer are taking prescription pain medication, you can comfortably wear a seatbelt, and you can safely maneuver your car and apply brakes. b. RETURN TO WORK:  __________1 week_______________ 9. You should see your doctor in the office for a follow-up appointment approximately two weeks after your surgery.  Your doctors nurse will typically make your follow-up appointment when she calls you with your pathology report.  Expect your pathology report 2-3 business days after your surgery.  You may call to check if you do not hear from [place] after three days.   WHEN TO CALL YOUR DOCTOR: 1. Fever over 101.0 2. Nausea and/or vomiting. 3. Extreme swelling or bruising. 4. Continued bleeding from incision. 5. Increased pain, redness, or drainage from the incision.  The clinic staff is available to answer your questions during regular business hours.  Please dont hesitate to call and ask to speak to one of the nurses for clinical concerns.  If you have a medical emergency, go to the nearest emergency room or call 911.  A surgeon from [facility] is always on call at the hospital.  For further questions, please visit [email or web address]   No Tylenol until 4pm   Post Anesthesia Home Care Instructions  Activity: Get plenty of rest for the remainder of the day. A responsible individual must  stay with you for 24 hours following the procedure.  For the next 24 hours, DO NOT: -Drive a car -Paediatric nurse -Drink alcoholic beverages -Take any medication unless instructed by your physician -Make any legal decisions or sign important papers.  Meals: Start with liquid foods such as gelatin or soup. Progress to regular foods as tolerated.  Avoid greasy, spicy, heavy foods. If nausea and/or vomiting occur, drink only clear liquids until the nausea and/or vomiting subsides. Call your physician if vomiting continues.  Special Instructions/Symptoms: Your throat may feel dry or sore from the anesthesia or the breathing tube placed in your throat during surgery. If this causes discomfort, gargle with warm salt water. The discomfort should disappear within 24 hours.  If you had a scopolamine patch placed behind your ear for the management of post- operative nausea and/or vomiting:  1. The medication in the patch is effective for 72 hours, after which it should be removed.  Wrap patch in a tissue and discard in the trash. Wash hands thoroughly with soap and water. 2. You may remove the patch earlier than 72 hours if you experience unpleasant side effects which may include dry mouth, dizziness or visual disturbances. 3. Avoid touching the patch. Wash your hands with soap and water after contact with the patch.

## 2019-10-04 NOTE — Interval H&P Note (Signed)
History and Physical Interval Note:  10/04/2019 9:24 AM  Theresa [name]  has presented today for surgery, with the diagnosis of RIGHT BREAST CANCER AND ABNORMAL RIGHT BREAST MRI.  The various methods of treatment have been discussed with the patient and family. After consideration of risks, benefits and other options for treatment, the patient has consented to  Procedure(s): RIGHT BREAST LUMPECTOMY WITH BRACKETED RADIOACTIVE SEEDS AND RIGHT SENTINEL LYMPH NODE BIOPSY (Right) RADIOACTIVE SEED GUIDED EXCISIONAL RIGHT BREAST BIOPSY (Right) as a surgical intervention.  The patient's history has been reviewed, patient examined, no change in status, stable for surgery.  I have reviewed the patient's chart and labs.  Questions were answered to the patient's satisfaction.     Stark Klein

## 2019-10-04 NOTE — Progress Notes (Signed)
Assisted Dr. Ossey with right, ultrasound guided, pectoralis block. Side rails up, monitors on throughout procedure. See vital signs in flow sheet. Tolerated Procedure well. 

## 2019-10-04 NOTE — Anesthesia Procedure Notes (Signed)
Anesthesia Regional Block: Pectoralis block   Pre-Anesthetic Checklist: ,, timeout performed, Correct Patient, Correct Site, Correct Laterality, Correct Procedure, Correct Position, site marked, Risks and benefits discussed,  Surgical consent,  Pre-op evaluation,  At surgeon's request and post-op pain management  Laterality: Right  Prep: chloraprep       Needles:  Injection technique: Single-shot     Needle Length: 9cm  Needle Gauge: 21     Additional Needles:   Narrative:  Start time: 10/04/2019 10:06 AM End time: 10/04/2019 10:16 AM Injection made incrementally with aspirations every 5 mL.  Performed by: Personally  Anesthesiologist: Lillia Abed, MD  Additional Notes: Monitors applied. Patient sedated. Sterile prep and drape,hand hygiene and sterile gloves were used. Relevant anatomy identified.Needle position confirmed.Local anesthetic injected incrementally after negative aspiration. Local anesthetic spread visualized. Vascular puncture avoided. No complications. Image printed for medical record.The patient tolerated the procedure well.

## 2019-10-10 ENCOUNTER — Other Ambulatory Visit: Payer: Self-pay | Admitting: General Surgery

## 2019-10-10 ENCOUNTER — Telehealth: Payer: Self-pay | Admitting: General Surgery

## 2019-10-10 ENCOUNTER — Encounter: Payer: Self-pay | Admitting: *Deleted

## 2019-10-10 NOTE — Telephone Encounter (Signed)
Discussed pathology with patient.   Will need reexcision of superior margin. Have messaged oncology about mammaprint/oncotype.  Will set up after this result is back in order to also place port if needed.

## 2019-10-11 ENCOUNTER — Telehealth: Payer: Self-pay | Admitting: *Deleted

## 2019-10-11 ENCOUNTER — Other Ambulatory Visit: Payer: Self-pay

## 2019-10-11 NOTE — Progress Notes (Signed)
GYNECOLOGY  VISIT   HPI: 64 y.o.   Married  Caucasian  female   G1P1001 with Patient's last menstrual period was 09/01/2007 (exact date).   here for surgical consult.   She has a 27 x 23 mm calcified right ovarian cyst noted at her [place] on 07/06/19, done for postmenopausal bleeding occurring off and on. Her left ovary was normal. She also had multiple fibroids, the largest 49 mm.  Her EMS was 2.9 mm. Saline US showed no filling defects.   CA125 was 7.8 on 07/06/19.  Her EMB showed benign atrophy.  New dx of invasive breast cancer with positive margins and positive lymph nodes. She was started on tamoxifen prior to her breast surgery.  She is expecting future surgery and potential port placement.  She has an appointment with medical oncology on 10/26/19. She is asking me to check her incisions on her breast and axillary region.  Patient is feeling sad with the diagnosis of her breast cancer.   GYNECOLOGIC HISTORY: Patient's last menstrual period was 09/01/2007 (exact date). Contraception:  Tubal Menopausal hormone therapy:  none Last mammogram: 08-07-19 MRI --see Epic Last pap smear: 06-23-19 Neg:Neg HR HPV,06-09-17 Neg:Neg HR HPV,05-02-15 Neg:Neg HRNeg        OB History    Gravida  1   Para  1   Term  1   Preterm      AB      Living  1     SAB      TAB      Ectopic      Multiple      Live Births                 Patient Active Problem List   Diagnosis Date Noted  . Genetic testing 08/04/2019  . Family history of uterine cancer   . Family history of stomach cancer   . Family history of bone cancer   . Family history of brain cancer   . Postmenopausal bleeding 07/09/2019  . Right ovarian cyst 07/09/2019  . Malignant neoplasm of lower-inner quadrant of right breast of female, estrogen receptor positive ([place]) 07/06/2019  . History of Clostridioides difficile infection 06/17/2018  . Fibroids 08/06/2017  . Breast mass in female-left at 12:00-benign  08/29/2012    Past Medical History:  Diagnosis Date  . Contact lens/glasses fitting    wears contacts or glasses  . Diarrhea    --post gallbladder surgery  . Family history of bone cancer   . Family history of brain cancer   . Family history of stomach cancer   . Family history of uterine cancer   . Fibroids   . Hypertension   . No pertinent past medical history   . PONV (postoperative nausea and vomiting)   . Right ovarian cyst   . Vertigo     Past Surgical History:  Procedure Laterality Date  . BLADDER SUSPENSION    . BREAST BIOPSY  09/08/2012   Procedure: BREAST BIOPSY;  Surgeon: Odis Hollingshead, MD;  Location: [place];  Service: General;  Laterality: Left;  remove left breast mass  . BREAST LUMPECTOMY WITH RADIOACTIVE SEED AND SENTINEL LYMPH NODE BIOPSY Right 10/04/2019   Procedure: RIGHT BREAST LUMPECTOMY WITH BRACKETED RADIOACTIVE SEEDS, RIGHT BREAST RADIOACTIVE SEED GUIDED EXCISION BIOPSY, AND RIGHT SENTINEL LYMPH NODE BIOPSY;  Surgeon: Stark Klein, MD;  Location: [place];  Service: General;  Laterality: Right;  . BREAST SURGERY     lumpectomy  x2  . BUNIONECTOMY Right 01/2016  . CHOLECYSTECTOMY    . COLONOSCOPY    . MASTOPEXY Right 10/04/2019   Procedure: RIGHT BREAST MASTOPEXY;  Surgeon: Stark Klein, MD;  Location: [place];  Service: General;  Laterality: Right;  . TUBAL LIGATION      Current Outpatient Medications  Medication Sig Dispense Refill  . CRANBERRY PO Take by mouth.    . Lactobacillus Rhamnosus, GG, (CULTURELLE PO) Take by mouth.    . loperamide (IMODIUM) 2 MG capsule Take 2 mg by mouth daily.    . Magnesium 100 MG TABS Take by mouth.    . Melatonin 10 MG CAPS Take by mouth.    . mupirocin ointment (BACTROBAN) 2 % APPLY TO AFFECTED AREA EVERY DAY    . nystatin ointment (MYCOSTATIN) Apply 1 application topically 2 (two) times daily. Apply to affected area for up to 7 days. 30 g 0  . OVER THE COUNTER  MEDICATION Vitamin C, B complex & quercetin    . Pumpkin Seed-Soy Germ (AZO BLADDER CONTROL/GO-LESS PO) Take by mouth.    . tamoxifen (NOLVADEX) 20 MG tablet Take 20 mg by mouth daily.    Marland Kitchen triamcinolone (KENALOG) 0.025 % ointment Apply 1 application topically 2 (two) times daily. Use for one week. 30 g 0  . UNABLE TO FIND Turmeric XL Liquid: takes daily    . valsartan (DIOVAN) 80 MG tablet Take 80 mg by mouth at bedtime.    Marland Kitchen VITAMIN D PO Take by mouth.     No current facility-administered medications for this visit.     ALLERGIES: Flagyl [metronidazole]  Family History  Problem Relation Age of Onset  . Hypertension Father   . Stomach cancer Father        may have been colon, diagnosed in his 78s  . Depression Mother   . Dementia Mother   . Thyroid disease Sister   . Heart disease Sister   . Brain cancer Maternal Aunt 80  . Cancer Maternal Grandmother        undetermined type, diagnosed in her 31s  . Bone cancer Paternal Grandfather 57  . Uterine cancer Paternal Aunt 74  . Uterine cancer Cousin        paternal 1st cousin, diagnosed in her late 47s  . Uterine cancer Cousin 76       paternal 1st cousin    Social History   Socioeconomic History  . Marital status: Married    Spouse name: Not on file  . Number of children: Not on file  . Years of education: Not on file  . Highest education level: Not on file  Occupational History  . Not on file  Tobacco Use  . Smoking status: Former Smoker    Types: Cigarettes    Quit date: 03/15/1996    Years since quitting: 23.5  . Smokeless tobacco: Never Used  Substance and Sexual Activity  . Alcohol use: No    Alcohol/week: 0.0 standard drinks  . Drug use: No  . Sexual activity: Not Currently    Partners: Male    Birth control/protection: Post-menopausal, Surgical    Comment: BTL  Other Topics Concern  . Not on file  Social History Narrative  . Not on file   Social Determinants of Health   Financial Resource Strain:    . Difficulty of Paying Living Expenses: Not on file  Food Insecurity:   . Worried About Charity fundraiser in the Last Year: Not on  file  . [place] in the Last Year: Not on file  Transportation Needs:   . Lack of Transportation (Medical): Not on file  . Lack of Transportation (Non-Medical): Not on file  Physical Activity:   . Days of Exercise per Week: Not on file  . Minutes of Exercise per Session: Not on file  Stress:   . Feeling of Stress : Not on file  Social Connections:   . Frequency of Communication with Friends and Family: Not on file  . Frequency of Social Gatherings with Friends and Family: Not on file  . Attends Religious Services: Not on file  . Active Member of Clubs or Organizations: Not on file  . Attends Archivist Meetings: Not on file  . Marital Status: Not on file  Intimate Partner Violence:   . Fear of Current or Ex-Partner: Not on file  . Emotionally Abused: Not on file  . Physically Abused: Not on file  . Sexually Abused: Not on file    Review of Systems  All other systems reviewed and are negative.   PHYSICAL EXAMINATION:    BP 120/78   Pulse 64   Temp (!) 97.5 F (36.4 C) (Temporal)   Ht 5\' 4"  (1.626 m)   Wt 140 lb 9.6 oz (63.8 kg)   LMP 09/01/2007 (Exact Date)   BMI 24.13 kg/m     General appearance: alert, cooperative and appears stated age Head: Normocephalic, without obvious abnormality, atraumatic Neck: no adenopathy, supple, symmetrical, trachea midline and thyroid normal to inspection and palpation Lungs: clear to auscultation bilaterally Breasts: right breast with scar healing.  Right axillary area with incision showing very slight separation at posterior apex. Dermabond peeling off.   Heart: regular rate and rhythm Abdomen: soft, non-tender, no masses,  no organomegaly Extremities: extremities normal, atraumatic, no cyanosis or edema Skin: Skin color, texture, turgor normal. No rashes or lesions Lymph nodes:  Cervical, supraclavicular, and axillary nodes normal. No abnormal inguinal nodes palpated Neurologic: Grossly normal  Pelvic: External genitalia:  no lesions              Urethra:  normal appearing urethra with no masses, tenderness or lesions              Bartholins and Skenes: normal                 Vagina: normal appearing vagina with normal color and discharge, no lesions              Cervix: no lesions                Bimanual Exam:  Uterus:  9 week size and nontender.              Adnexa: no mass, fullness, tenderness             Chaperone was present for exam.  ASSESSMENT  Hx recurrent postmenopausal bleeding.  Uterine fibroids.  Benign endometrial biopsy.  Right ovarian cyst.  Normal CA125.  Status post recent lumpectomy of right breast with positive margin and positive nodes.   PLAN  I discussed total laparoscopic hysterectomy with bilateral salpingo-oophorectomy.  We discussed vaginal morcellation in an Alexis bag.   I reviewed risks, benefits, and alternatives.  Risks include but are not limited to bleeding, infection, damage to surrounding organs, pneumonia, reaction to anesthesia, DVT, PE, death, need for reoperation, hernia formation, vaginal cuff dehiscence, neuropathy, need to convert to a traditional laparotomy incision to complete  the procedure.   She will receive Lovenox for DVT/PE prophylaxis. Stop herbal treatments and vitamin supplement.   Surgical expectations and recovery discussed.    The patient wishes very much to proceed with surgery, but we discussed the importance of reaching out to her breast surgeon prior to proceeding forward.  Her breast cancer care is our priority for her right now.  I have reassured her that we do not think she has malignancy of her reproductive organs.  Her hysterectomy can be delayed if necessary.   Emotional support given.   __30____ minutes face to face time of which over 50% was spent in counseling.

## 2019-10-11 NOTE — Telephone Encounter (Signed)
Received order for Mammaprint testing. Requisition faxed to pathology and [facility]. Received by Varney Biles.

## 2019-10-12 ENCOUNTER — Telehealth: Payer: Self-pay | Admitting: Obstetrics and Gynecology

## 2019-10-12 ENCOUNTER — Ambulatory Visit (INDEPENDENT_AMBULATORY_CARE_PROVIDER_SITE_OTHER): Payer: Commercial Managed Care - PPO | Admitting: Obstetrics and Gynecology

## 2019-10-12 ENCOUNTER — Encounter: Payer: Self-pay | Admitting: Obstetrics and Gynecology

## 2019-10-12 VITALS — BP 120/78 | HR 64 | Temp 97.5°F | Ht 64.0 in | Wt 140.6 lb

## 2019-10-12 DIAGNOSIS — N83201 Unspecified ovarian cyst, right side: Secondary | ICD-10-CM | POA: Diagnosis not present

## 2019-10-12 DIAGNOSIS — D219 Benign neoplasm of connective and other soft tissue, unspecified: Secondary | ICD-10-CM

## 2019-10-12 NOTE — Telephone Encounter (Signed)
Spoke with Kennyth Lose nurse in triage at Dr.Byerly's office. Kennyth Lose is sending a message to [facility] and will return call directly to the office to discuss proceeding with surgery on 10/23/2019.

## 2019-10-12 NOTE — Telephone Encounter (Signed)
Please contact Dr. Marlowe Aschoff office to see if it is ok for me to proceed with the patient's total laparoscopic hysterectomy with bilateral salpingectomy on 10/23/19.   I understand she may be needing additional excisional surgery on her breast due to her cancer.

## 2019-10-13 NOTE — Telephone Encounter (Signed)
Thank you for the update!

## 2019-10-13 NOTE — Telephone Encounter (Signed)
Routing to Kaitlyn Sprague, RN °

## 2019-10-13 NOTE — Telephone Encounter (Signed)
Call to Dr.Byerly's office. Nurse to review with Dr.Byerly and return call.

## 2019-10-13 NOTE — Telephone Encounter (Signed)
Patient is calling to speak with nurse about surgery. Sh is scheduled to have another breast surgery a few days after.

## 2019-10-13 NOTE — Telephone Encounter (Signed)
Spoke with April at Dr.Byerly's office who states Dr.Byerly will not be in the office until 10/17/2019 and she will return call to the office then to discuss.  Spoke with the patient who has decided she would rather cancel surgery with Dr.Silva at this time to be safe. Does not want to risk delay in future breast surgery.  Advised I will cancel her surgery and we can make future plans for rescheduling after she meets with the Breast Care team following her second surgery with Dr.Byerly. Advised her breast care comes first. Patient is agreeable.  Call to Dr.Byerly's office. Spoke with April advised surgery for 10/23/2019 has been cancelled after speaking with the patient. April verbalizes understanding.  Patient will reach out to the office after making plans with her breast care team following surgery on 10/31/2019 with Dr.Byerly.

## 2019-10-13 NOTE — Telephone Encounter (Signed)
Spoke with patient. Patient states she is scheduled for additional surgery with Dr.Byerly on 10/31/2019. Advised I have reached out to Dr.Byerly's office to review surgery dates and concerns.  Patient is aware surgery on 10/23/2019 may need to be cancelled as her breast care is the top priority at this time. Advised will contact Dr.Byerly's office to obtain an update and return call to her as soon as possible.

## 2019-10-18 ENCOUNTER — Encounter (HOSPITAL_COMMUNITY): Admission: RE | Admit: 2019-10-18 | Payer: Commercial Managed Care - PPO | Source: Ambulatory Visit

## 2019-10-19 ENCOUNTER — Other Ambulatory Visit (HOSPITAL_COMMUNITY): Payer: Self-pay

## 2019-10-19 ENCOUNTER — Encounter (HOSPITAL_COMMUNITY): Payer: Commercial Managed Care - PPO

## 2019-10-20 ENCOUNTER — Telehealth: Payer: Self-pay | Admitting: *Deleted

## 2019-10-20 DIAGNOSIS — Z170 Estrogen receptor positive status [ER+]: Secondary | ICD-10-CM

## 2019-10-20 DIAGNOSIS — C50311 Malignant neoplasm of lower-inner quadrant of right female breast: Secondary | ICD-10-CM

## 2019-10-20 NOTE — Telephone Encounter (Signed)
Received mammaprint results of low risk.  Patient is aware and overwhelmed with joy.  Referral placed for an appointment with Dr. Isidore Moos.

## 2019-10-23 ENCOUNTER — Other Ambulatory Visit: Payer: Self-pay

## 2019-10-23 ENCOUNTER — Ambulatory Visit (HOSPITAL_BASED_OUTPATIENT_CLINIC_OR_DEPARTMENT_OTHER): Admit: 2019-10-23 | Payer: Commercial Managed Care - PPO | Admitting: Obstetrics and Gynecology

## 2019-10-23 ENCOUNTER — Encounter (HOSPITAL_BASED_OUTPATIENT_CLINIC_OR_DEPARTMENT_OTHER): Payer: Self-pay

## 2019-10-23 ENCOUNTER — Encounter (HOSPITAL_BASED_OUTPATIENT_CLINIC_OR_DEPARTMENT_OTHER): Payer: Self-pay | Admitting: General Surgery

## 2019-10-23 SURGERY — HYSTERECTOMY, TOTAL, LAPAROSCOPIC, WITH BILATERAL SALPINGO-OOPHORECTOMY
Anesthesia: General

## 2019-10-24 ENCOUNTER — Encounter: Payer: Self-pay | Admitting: Oncology

## 2019-10-25 ENCOUNTER — Other Ambulatory Visit: Payer: Self-pay | Admitting: *Deleted

## 2019-10-25 DIAGNOSIS — N63 Unspecified lump in unspecified breast: Secondary | ICD-10-CM

## 2019-10-25 DIAGNOSIS — C50311 Malignant neoplasm of lower-inner quadrant of right female breast: Secondary | ICD-10-CM

## 2019-10-25 LAB — SURGICAL PATHOLOGY

## 2019-10-25 NOTE — Progress Notes (Signed)
[facility]  [phone] [phone]     ID: Theresa [name] DOB: 1961-08-29  MR#: [id number]  UVO#:[id number]  Patient Care Team: [name], MD as PCP - General (Internal Medicine) Mauro Kaufmann, RN as Oncology Nurse Navigator Rockwell Germany, RN as Oncology Nurse Navigator Stark Klein, MD as Consulting Physician (General Surgery) Chidiebere Wynn, Virgie Dad, MD as Consulting Physician (Oncology) Eppie Gibson, MD as Attending Physician (Radiation Oncology) Nunzio Cobbs, MD as Consulting Physician (Obstetrics and Gynecology) Rolm Bookbinder, MD as Consulting Physician (Dermatology) Ardis Hughs, MD as Consulting Physician (Urology) Juanita Craver, MD as Consulting Physician (Gastroenterology) Chauncey Cruel, MD OTHER MD:  CHIEF COMPLAINT: Estrogen receptor positive breast cancer  CURRENT TREATMENT:    INTERVAL HISTORY: Angline returns today for follow up of her estrogen receptor positive breast cancer. She was evaluated in the multidisciplinary breast cancer clinic on 07/26/2019.  Her genetics testing returned negative.  Since consultation, she underwent breast MRI on 08/07/2019 showing: breast composition C; biopsy-proven malignancy in lower-inner right breast, noted with surrounding non-mass enhancement for a total measurement of 3.1 cm, which extends 2 cm anterior to the anterior-most biopsy clip; suspicious 7 mm enhancing mass in superior central right breast; indeterminate 6 mm enhancing mass in upper-inner left breast; no suspicious lymphadenopathy.  She proceeded to biopsies of the three areas in question on 08/21/2019. Pathology from the procedure ([id number]) showed no malignancy in all areas.  She opted to proceed with right lumpectomy and right sentinel lymph node biopsy on 10/04/2019 under Dr. Barry Dienes. Pathology from the procedure ([id number]) revealed: invasive ductal carcinoma, grade 2, 2.8 cm, extends to inked superior  margin; lymphovascular space invasion present; fibrocystic changes with calcifications.  Out of the three biopsied lymph nodes, two returned positive for metastatic carcinoma (2/3).  1 of visit was in micro deposit.  Mammaprint was ordered on the final surgical sample and showed low risk.  Her case was represented on 10/18/2019 at the multidisciplinary breast cancer clinic.  At that point it was decided that she would have additional surgery and radiation.  She is scheduled for re-excision of the positive margin on 10/31/2019.  She will start her radiation 3 or 4 weeks afterwards.  Of note, she is also planning to undergo hysterectomy with BSO under Dr. Quincy Simmonds in the near future for uterine fibroids, postmenopausal bleeding, and right ovarian cyst. The breast cancer is taking priority, and since it is likely the hysterectomy will show benign pathology, this has been delayed.   REVIEW OF SYSTEMS: Rania feels like she is gone through an incredible ordeal and once she heard that she had positive lymph node she started planning her funeral.  She of course needs to have that margin cleared and that is going to happen next week.  She was already scheduled to have her hysterectomy and bilateral salpingo-oophorectomy next week as well but she is postponed that.  She is having some burning feelings in the right axilla from the prior surgery, but she has had no bleeding or frank pain.  She is tolerating the tamoxifen without any side effects that she is aware of.  She says she was having hot flashes before starting it and they really are not worse although there may be a little bit more frequent on tamoxifen.  A detailed review of systems was otherwise stable.   HISTORY OF CURRENT ILLNESS: From the original intake note:  Theresa [name] has a history of prior left breast excision on 09/08/2012.  Pathology from this ([id number]) showed: cyst with features of rupture and associated fat necrosis; no evidence of malignancy.  She  had routine screening mammography on 06/14/2019 showing a possible abnormality in the right breast. She then underwent right diagnostic mammography with tomography and right breast ultrasonography at [facility] on 06/21/2019 showing: breast density category C; 1.1 cm irregular mass in the right breast at 3:30.; adjacent smaller 6 mm mass in the right breast at 3:30; no suspicious right axillary lymph nodes.  Accordingly on 07/03/2019 she proceeded to biopsy of the right breast areas in question. The pathology from this procedure ([id number]) showed: invasive mammary carcinoma, grade 2, e-cadherin positive. Prognostic indicators significant for: estrogen receptor, 95% positive with strong staining intensity, progesterone receptor, 0% negative. Proliferation marker Ki67 at 5%. HER2 equivocal by immunohistochemistry (2+), but negative by fluorescent in situ hybridization with a signals ratio 1.58 and number per cell 2.60.  The patient's subsequent history is as detailed below.   PAST MEDICAL HISTORY: Past Medical History:  Diagnosis Date  . Contact lens/glasses fitting    wears contacts or glasses  . Diarrhea    --post gallbladder surgery  . Family history of bone cancer   . Family history of brain cancer   . Family history of stomach cancer   . Family history of uterine cancer   . Fibroids   . Hypertension   . No pertinent past medical history   . PONV (postoperative nausea and vomiting)   . Right ovarian cyst   . 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   PAST SURGICAL HISTORY: Past Surgical History:  Procedure Laterality Date  . BLADDER SUSPENSION    . BREAST BIOPSY  09/08/2012   Procedure: BREAST BIOPSY;  Surgeon: Odis Hollingshead, MD;  Location: [place];  Service: General;  Laterality: Left;  remove left breast mass  . BREAST LUMPECTOMY WITH RADIOACTIVE SEED AND SENTINEL LYMPH NODE BIOPSY Right 10/04/2019   Procedure: RIGHT BREAST LUMPECTOMY WITH BRACKETED RADIOACTIVE  SEEDS, RIGHT BREAST RADIOACTIVE SEED GUIDED EXCISION BIOPSY, AND RIGHT SENTINEL LYMPH NODE BIOPSY;  Surgeon: Stark Klein, MD;  Location: [facility];  Service: General;  Laterality: Right;  . BREAST SURGERY     lumpectomy x2  . BUNIONECTOMY Right 01/2016  . CHOLECYSTECTOMY    . COLONOSCOPY    . MASTOPEXY Right 10/04/2019   Procedure: RIGHT BREAST MASTOPEXY;  Surgeon: Stark Klein, MD;  Location: [facility];  Service: General;  Laterality: Right;  . TUBAL LIGATION      FAMILY HISTORY: Family History  Problem Relation Age of Onset  . Hypertension Father   . Stomach cancer Father        may have been colon, diagnosed in his 25s  . Depression Mother   . Dementia Mother   . Thyroid disease Sister   . Heart disease Sister   . Brain cancer Maternal Aunt 80  . Cancer Maternal Grandmother        undetermined type, diagnosed in her 35s  . Bone cancer Paternal Grandfather 65  . Uterine cancer Paternal Aunt 25  . Uterine cancer Cousin        paternal 1st cousin, diagnosed in her late 22s  . Uterine cancer Cousin 54       paternal 48st cousin   Patient's father was 73 years old when he died.  He had "stomach cancer", she thinks possibly colon.  Patient's mother died from complications from a fall at age 22.  The patient denies a family hx of breast or ovarian cancer. She had no brothers, 2 sisters, 1 of whom died from sepsis.  1 grandmother, a former Armed forces logistics/support/administrative officer, died from what may have been lung cancer.  She also reports uterine cancer in a cousin and possibly an aunt.   GYNECOLOGIC HISTORY:  Patient's last menstrual period was 09/01/2007 (exact date). Menarche: 64 years old Age at first live birth: 64 years old (the patient has a retroverted uterus, likely explaining her difficulty in getting pregnant) [place] P 1 LMP around age 76 Contraceptive used from 8413-2440, no complications HRT used for 5 years, 2013 until 01 /1027 , no complications Hysterectomy? no  BSO? no Of note, she has a history of postmenopausal bleeding, and is status post several benign cervical polyps and benign endometrial biopsies; she has a right ovarian cyst followed by Dr. Quincy Simmonds.  Most recent endometrial biopsy 07/06/2019 was benign, as was CA-125.   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 Her husband Coralyn Pear is a Librarian, academic for an Geophysicist/field seismologist. She lives at home with husband Daryl.  They have some horses.  Son Luisa Hart, age 83, is studying criminal justice at [employer] in [place].     ADVANCED DIRECTIVES: In the absence of any documentation to the contrary, the patient's spouse is their HCPOA.   HEALTH MAINTENANCE: Social History   Tobacco Use  . Smoking status: Former Smoker    Types: Cigarettes    Quit date: 03/15/1996    Years since quitting: 23.6  . Smokeless tobacco: Never Used  Substance Use Topics  . Alcohol use: No    Alcohol/week: 0.0 standard drinks  . Drug use: No     Colonoscopy: date unsure, possibly 2014  PAP: 06/2019, negative  Bone density: never done   Allergies  Allergen Reactions  . Flagyl [Metronidazole]     C-Diff  . Other     Dermabond - redness and burning    Current Outpatient Medications  Medication Sig Dispense Refill  . CRANBERRY PO Take by mouth.    . Lactobacillus Rhamnosus, GG, (CULTURELLE PO) Take by mouth.    . loperamide (IMODIUM) 2 MG capsule Take 2 mg by mouth daily.    . Magnesium 100 MG TABS Take by mouth.    . Melatonin 10 MG CAPS Take by mouth.    . mupirocin ointment (BACTROBAN) 2 % APPLY TO AFFECTED AREA EVERY DAY    . nystatin ointment (MYCOSTATIN) Apply 1 application topically 2 (two) times daily. Apply to affected area for up to 7 days. 30 g 0  . OVER THE COUNTER MEDICATION Vitamin C, B complex & quercetin    . Pumpkin Seed-Soy Germ (AZO BLADDER CONTROL/GO-LESS PO) Take by mouth.    . tamoxifen (NOLVADEX) 20 MG tablet Take 20 mg by mouth daily.    Marland Kitchen  triamcinolone (KENALOG) 0.025 % ointment Apply 1 application topically 2 (two) times daily. Use for one week. 30 g 0  . UNABLE TO FIND Turmeric XL Liquid: takes daily    . valsartan (DIOVAN) 80 MG tablet Take 80 mg by mouth at bedtime.    Marland Kitchen VITAMIN D PO Take by mouth.     No current facility-administered medications for this visit.    OBJECTIVE: Middle-aged white woman who appears younger than stated age  64:   10/26/19 1310  BP: (!) 148/71  Pulse: 78  Resp: 20  Temp: 98.5 F (36.9 C)  SpO2: 100%  Body mass index is 23.82 kg/m.   Wt Readings from Last 3 Encounters:  10/26/19 138 lb 12.8 oz (63 kg)  10/12/19 140 lb 9.6 oz (63.8 kg)  10/04/19 138 lb 14.2 oz (63 kg)      ECOG FS:1 - Symptomatic but completely ambulatory  Sclerae unicteric, EOMs intact Wearing a mask No cervical or supraclavicular adenopathy Lungs no rales or rhonchi Heart regular rate and rhythm Abd soft, nontender, positive bowel sounds MSK no focal spinal tenderness, no upper extremity lymphedema Neuro: nonfocal, well oriented, appropriate affect Breasts: The right breast is status post recent lumpectomy.  The cosmetic result is excellent.  There is some erythema associated with both scars and some scar tissue associated with the axillary scare but no dehiscence and no swelling.  Left breast benign.  Left axilla is benign.   LAB RESULTS:  CMP     Component Value Date/Time   NA 139 10/26/2019 1245   K 4.4 10/26/2019 1245   CL 103 10/26/2019 1245   CO2 28 10/26/2019 1245   GLUCOSE 107 (H) 10/26/2019 1245   BUN 12 10/26/2019 1245   CREATININE 0.72 10/26/2019 1245   CALCIUM 9.2 10/26/2019 1245   PROT 6.9 10/26/2019 1245   ALBUMIN 4.0 10/26/2019 1245   AST 17 10/26/2019 1245   ALT 14 10/26/2019 1245   ALKPHOS 45 10/26/2019 1245   BILITOT 0.3 10/26/2019 1245   GFRNONAA >60 10/26/2019 1245   GFRAA >60 10/26/2019 1245    No results found for: TOTALPROTELP, ALBUMINELP, A1GS, A2GS, BETS,  BETA2SER, GAMS, MSPIKE, SPEI  No results found for: KPAFRELGTCHN, LAMBDASER, KAPLAMBRATIO  Lab Results  Component Value Date   WBC 7.1 10/26/2019   NEUTROABS 3.5 10/26/2019   HGB 12.0 10/26/2019   HCT 36.9 10/26/2019   MCV 94.4 10/26/2019   PLT 219 10/26/2019    No results found for: LABCA2  No components found for: NMMHWK088  No results for input(s): INR in the last 168 hours.  No results found for: LABCA2  No results found for: CAN199  Lab Results  Component Value Date   CAN125 7.8 07/06/2019    No results found for: PJS315  No results found for: CA2729  No components found for: HGQUANT  No results found for: CEA1 / No results found for: CEA1   No results found for: AFPTUMOR  No results found for: CHROMOGRNA  No results found for: HGBA, HGBA2QUANT, HGBFQUANT, HGBSQUAN (Hemoglobinopathy evaluation)   No results found for: LDH  No results found for: IRON, TIBC, IRONPCTSAT (Iron and TIBC)  No results found for: FERRITIN  Urinalysis    Component Value Date/Time   BILIRUBINUR n 05/15/2016 0902   PROTEINUR n 05/15/2016 0902   UROBILINOGEN negative 05/15/2016 0902   NITRITE n 05/15/2016 0902   LEUKOCYTESUR moderate (2+) (A) 05/15/2016 0902    STUDIES: NM Sentinel Node Inj-No Rpt (Breast)  Result Date: 10/04/2019 Sulfur colloid was injected by the nuclear medicine technologist for melanoma sentinel node.   MM Breast Surgical Specimen  Result Date: 10/04/2019 CLINICAL DATA:  Status post seed localized lumpectomy of 3 sites in the RIGHT breast. EXAM: SPECIMEN RADIOGRAPH OF THE RIGHT BREAST COMPARISON:  Previous exam(s). FINDINGS: Status post excision of the right breast. Three radioactive seeds are present. Cylinder, coil, and ribbon shaped clips are present. The findings are discussed with the operating room nurse at the time of interpretation. IMPRESSION: Specimen radiograph of the right breast. Electronically Signed   By: Nolon Nations M.D.   On:  10/04/2019 11:18   MM RT RADIOACTIVE SEED LOC MAMMO GUIDE  Result Date: 10/03/2019 CLINICAL DATA:  Patient presents for seed localization of the RIGHT breast prior to lumpectomy. EXAM: MAMMOGRAPHIC GUIDED RADIOACTIVE SEED LOCALIZATION OF THE RIGHT BREAST x3 site COMPARISON:  Previous exam(s). FINDINGS: Patient presents for radioactive seed localization prior to lumpectomy. I met with the patient and we discussed the procedure of seed localization including benefits and alternatives. We discussed the high likelihood of a successful procedure. We discussed the risks of the procedure including infection, bleeding, tissue injury and further surgery. We discussed the low dose of radioactivity involved in the procedure. Informed, written consent was given. The usual time-out protocol was performed immediately prior to the procedure. Using mammographic guidance, sterile technique, 1% lidocaine and an I-125 radioactive seed, the coil shaped clip in the posterior LOWER INNER QUADRANT of the RIGHT breast was localized using a MEDIAL to LATERAL approach. Follow-up survey of the patient confirms presence of the radioactive seed. Order number of I-125 seed:  [id number]. Total activity:  9.485 millicuries reference Date: 08/23/2019 Using mammographic guidance, sterile technique, 1% lidocaine and an I-125 radioactive seed, ribbon shaped clip in the posterior LOWER INNER QUADRANT of the RIGHT breast was localized using a MEDIAL to LATERAL approach. Follow-up survey of the patient confirms presence of the radioactive seed. Order number of I-125 seed:  [id number]. Total activity:  9.381 millicuries reference Date: 08/23/2019 Using mammographic guidance, sterile technique, 1% lidocaine and an I-125 radioactive seed, the cylinder-shaped clip in the [place] of the RIGHT breast was localized using a MEDIAL to LATERAL approach. Follow-up survey of the patient confirms presence of the radioactive seed. Order number of I-125  seed:  [id number]. Total activity:  6.789 millicuries reference Date: 08/23/2019 The follow-up mammogram images confirm the seed in the expected location and were marked for Dr. Barry Dienes. The patient tolerated the procedure well and was released from the Breast Center. She was given instructions regarding seed removal. IMPRESSION: Radioactive seed localization of 3 sites in the right breast. No apparent complications. Electronically Signed   By: Nolon Nations M.D.   On: 10/03/2019 14:15   MM RT RADIO SEED EA ADD LESION LOC MAMMO  Result Date: 10/03/2019 CLINICAL DATA:  Patient presents for seed localization of the RIGHT breast prior to lumpectomy. EXAM: MAMMOGRAPHIC GUIDED RADIOACTIVE SEED LOCALIZATION OF THE RIGHT BREAST x3 site COMPARISON:  Previous exam(s). FINDINGS: Patient presents for radioactive seed localization prior to lumpectomy. I met with the patient and we discussed the procedure of seed localization including benefits and alternatives. We discussed the high likelihood of a successful procedure. We discussed the risks of the procedure including infection, bleeding, tissue injury and further surgery. We discussed the low dose of radioactivity involved in the procedure. Informed, written consent was given. The usual time-out protocol was performed immediately prior to the procedure. Using mammographic guidance, sterile technique, 1% lidocaine and an I-125 radioactive seed, the coil shaped clip in the posterior LOWER INNER QUADRANT of the RIGHT breast was localized using a MEDIAL to LATERAL approach. Follow-up survey of the patient confirms presence of the radioactive seed. Order number of I-125 seed:  [id number]. Total activity:  2.585 millicuries reference Date: 08/23/2019 Using mammographic guidance, sterile technique, 1% lidocaine and an I-125 radioactive seed, ribbon shaped clip in the posterior LOWER INNER QUADRANT of the RIGHT breast was localized using a MEDIAL to LATERAL approach. Follow-up  survey of the patient confirms presence of the radioactive seed. Order number of I-125  seed:  [id number]. Total activity:  6.659 millicuries reference Date: 08/23/2019 Using mammographic guidance, sterile technique, 1% lidocaine and an I-125 radioactive seed, the cylinder-shaped clip in the [place] of the RIGHT breast was localized using a MEDIAL to LATERAL approach. Follow-up survey of the patient confirms presence of the radioactive seed. Order number of I-125 seed:  [id number]. Total activity:  3.903 millicuries reference Date: 08/23/2019 The follow-up mammogram images confirm the seed in the expected location and were marked for Dr. Barry Dienes. The patient tolerated the procedure well and was released from the Breast Center. She was given instructions regarding seed removal. IMPRESSION: Radioactive seed localization of 3 sites in the right breast. No apparent complications. Electronically Signed   By: Nolon Nations M.D.   On: 10/03/2019 14:15   MM RT RADIO SEED EA ADD LESION LOC MAMMO  Result Date: 10/03/2019 CLINICAL DATA:  Patient presents for seed localization of the RIGHT breast prior to lumpectomy. EXAM: MAMMOGRAPHIC GUIDED RADIOACTIVE SEED LOCALIZATION OF THE RIGHT BREAST x3 site COMPARISON:  Previous exam(s). FINDINGS: Patient presents for radioactive seed localization prior to lumpectomy. I met with the patient and we discussed the procedure of seed localization including benefits and alternatives. We discussed the high likelihood of a successful procedure. We discussed the risks of the procedure including infection, bleeding, tissue injury and further surgery. We discussed the low dose of radioactivity involved in the procedure. Informed, written consent was given. The usual time-out protocol was performed immediately prior to the procedure. Using mammographic guidance, sterile technique, 1% lidocaine and an I-125 radioactive seed, the coil shaped clip in the posterior LOWER INNER QUADRANT  of the RIGHT breast was localized using a MEDIAL to LATERAL approach. Follow-up survey of the patient confirms presence of the radioactive seed. Order number of I-125 seed:  [id number]. Total activity:  6.226 millicuries reference Date: 08/23/2019 Using mammographic guidance, sterile technique, 1% lidocaine and an I-125 radioactive seed, ribbon shaped clip in the posterior LOWER INNER QUADRANT of the RIGHT breast was localized using a MEDIAL to LATERAL approach. Follow-up survey of the patient confirms presence of the radioactive seed. Order number of I-125 seed:  [id number]. Total activity:  6.389 millicuries reference Date: 08/23/2019 Using mammographic guidance, sterile technique, 1% lidocaine and an I-125 radioactive seed, the cylinder-shaped clip in the [place] of the RIGHT breast was localized using a MEDIAL to LATERAL approach. Follow-up survey of the patient confirms presence of the radioactive seed. Order number of I-125 seed:  [id number]. Total activity:  1.157 millicuries reference Date: 08/23/2019 The follow-up mammogram images confirm the seed in the expected location and were marked for Dr. Barry Dienes. The patient tolerated the procedure well and was released from the Breast Center. She was given instructions regarding seed removal. IMPRESSION: Radioactive seed localization of 3 sites in the right breast. No apparent complications. Electronically Signed   By: Nolon Nations M.D.   On: 10/03/2019 14:15    ELIGIBLE FOR AVAILABLE RESEARCH PROTOCOL: no  ASSESSMENT: 64 y.o. [place] woman status post right lower inner quadrant biopsy 07/03/2019 for a clinicaly multiple T1c N0, stage IA invasive ductal carcinoma, grade 2, E-cadherin positive, strongly estrogen receptor positive but progesterone receptor negative and HER-2 not amplified, with an MIB-1 of 5%  (a) breast MRI 08/07/2019 showed 2 additional suspicious areas in the right breast and 1 in the left breast   (b) biopsy of all 3 areas  08/21/2019 showed no malignancy  (1) tamoxifen started neoadjuvantly 07/26/2019  (2) right lumpectomy and  sentinel lymph node sampling 10/04/2019 showed a pT2 pN1, stage IIB invasive ductal carcinoma, grade 2, with positive lymphovascular invasion and a positive superior margin  (a) 3 sentinel lymph nodes removed, one with macro metastatic deposit, 1 with a micrometastatic deposit  (b) additional surgery for margin clearance  (3) MammaPrint on the 10/04/2019 sample shows a low risk luminal A predicting a 5-year metastasis free survival of 96% without chemotherapy, and a chemotherapy benefit of less than 1.5%  (4) adjuvant radiation pending  (5) genetics testing 08/04/2019 through the STAT Breast cancer panel offered by [facility] found no deleterious mutations in ATM, BRCA1, BRCA2, CDH1, CHEK2, PALB2, PTEN, STK11 and TP53.  The Common Hereditary Cancers Panel offered by [facility] includes sequencing and/or deletion duplication testing of the following 48 genes: APC, ATM, AXIN2, BARD1, BMPR1A, BRCA1, BRCA2, BRIP1, CDH1, CDK4, CDKN2A (p14ARF), CDKN2A (p16INK4a), CHEK2, CTNNA1, DICER1, EPCAM (Deletion/duplication testing only), GREM1 (promoter region deletion/duplication testing only), KIT, MEN1, MLH1, MSH2, MSH3, MSH6, MUTYH, NBN, NF1, NHTL1, PALB2, PDGFRA, PMS2, POLD1, POLE, PTEN, RAD50, RAD51C, RAD51D, RNF43, SDHB, SDHC, SDHD, SMAD4, SMARCA4. STK11, TP53, TSC1, TSC2, and VHL.  The following genes were evaluated for sequence changes only: SDHA and HOXB13 c.251G>A variant only.   (6) hysterectomy and bilateral salpingo-oophorectomy planned   (a) endometrial biopsy 07/06/2019, shows no malignancy  PLAN: Katti was understandably concerned that the news of nodal positivity and certainly not that many years ago she would have been content to chemotherapy because of that anatomy.  However now that we have the MammaPrint a good half to two thirds of patients like her can safely avoid chemotherapy.  I explained  to her that her tumor has a luminal a signature and that these tumors generally do well long-term and did not benefit from chemotherapy.  She understands given her node positivity if she does not receive chemo but only tamoxifen for 5 years she has a 96% chance of being alive with no evidence of metastatic disease 5 years from now.  By the time 5 years past we will have longer-term data from the original study on which MammaPrint dispensed.  At this point though we are planning on tamoxifen for 10 years.  I have encouraged her to exercise regularly.  She will have her margins clear next week.  She will proceed to radiation after that.  She can have her Covid vaccine whenever she can obtain it.  She will then have her oncologic surgery most likely late May or early June.  Note that her insurance turns over in July so she really would want it to be before then.  She will see me again in late June.  She knows to call for any other issue that may develop before that visit.  Total encounter time 30 minutes.Chauncey Cruel, MD   10/26/2019 1:41 PM Medical Oncology and Hematology [facility] [place], [place] [address] Tel. [phone]    Fax. [phone]   This document serves as a record of services personally performed by Lurline Del, MD. It was created on his behalf by Wilburn Mylar, a trained medical scribe. The creation of this record is based on the scribe's personal observations and the provider's statements to them.   I, Lurline Del MD, have reviewed the above documentation for accuracy and completeness, and I agree with the above.   *Total Encounter Time as defined by the Centers for Medicare and Medicaid Services includes, in addition to the face-to-face time of a patient  visit (documented in the note above) non-face-to-face time: obtaining and reviewing outside history, ordering and reviewing medications, tests or procedures, care coordination  (communications with other health care professionals or caregivers) and documentation in the medical record.

## 2019-10-26 ENCOUNTER — Inpatient Hospital Stay: Payer: Commercial Managed Care - PPO

## 2019-10-26 ENCOUNTER — Inpatient Hospital Stay: Payer: Commercial Managed Care - PPO | Attending: Oncology | Admitting: Oncology

## 2019-10-26 ENCOUNTER — Other Ambulatory Visit: Payer: Self-pay

## 2019-10-26 VITALS — BP 148/71 | HR 78 | Temp 98.5°F | Resp 20 | Ht 64.0 in | Wt 138.8 lb

## 2019-10-26 DIAGNOSIS — Z79810 Long term (current) use of selective estrogen receptor modulators (SERMs): Secondary | ICD-10-CM | POA: Insufficient documentation

## 2019-10-26 DIAGNOSIS — Z8349 Family history of other endocrine, nutritional and metabolic diseases: Secondary | ICD-10-CM | POA: Insufficient documentation

## 2019-10-26 DIAGNOSIS — Z8049 Family history of malignant neoplasm of other genital organs: Secondary | ICD-10-CM | POA: Insufficient documentation

## 2019-10-26 DIAGNOSIS — Z8249 Family history of ischemic heart disease and other diseases of the circulatory system: Secondary | ICD-10-CM | POA: Insufficient documentation

## 2019-10-26 DIAGNOSIS — Z170 Estrogen receptor positive status [ER+]: Secondary | ICD-10-CM | POA: Diagnosis not present

## 2019-10-26 DIAGNOSIS — I10 Essential (primary) hypertension: Secondary | ICD-10-CM | POA: Insufficient documentation

## 2019-10-26 DIAGNOSIS — N83201 Unspecified ovarian cyst, right side: Secondary | ICD-10-CM

## 2019-10-26 DIAGNOSIS — Z79899 Other long term (current) drug therapy: Secondary | ICD-10-CM | POA: Diagnosis not present

## 2019-10-26 DIAGNOSIS — Z87891 Personal history of nicotine dependence: Secondary | ICD-10-CM | POA: Diagnosis not present

## 2019-10-26 DIAGNOSIS — N63 Unspecified lump in unspecified breast: Secondary | ICD-10-CM

## 2019-10-26 DIAGNOSIS — Z800 Family history of malignant neoplasm of digestive organs: Secondary | ICD-10-CM | POA: Diagnosis not present

## 2019-10-26 DIAGNOSIS — C50311 Malignant neoplasm of lower-inner quadrant of right female breast: Secondary | ICD-10-CM

## 2019-10-26 DIAGNOSIS — Z808 Family history of malignant neoplasm of other organs or systems: Secondary | ICD-10-CM | POA: Diagnosis not present

## 2019-10-26 LAB — CMP (CANCER CENTER ONLY)
ALT: 14 U/L (ref 0–44)
AST: 17 U/L (ref 15–41)
Albumin: 4 g/dL (ref 3.5–5.0)
Alkaline Phosphatase: 45 U/L (ref 38–126)
Anion gap: 8 (ref 5–15)
BUN: 12 mg/dL (ref 8–23)
CO2: 28 mmol/L (ref 22–32)
Calcium: 9.2 mg/dL (ref 8.9–10.3)
Chloride: 103 mmol/L (ref 98–111)
Creatinine: 0.72 mg/dL (ref 0.44–1.00)
GFR, Est AFR Am: >60 mL/min (ref >60)
GFR, Estimated: >60 mL/min (ref >60)
Glucose, Bld: 107 mg/dL — ABNORMAL HIGH (ref 70–99)
Potassium: 4.4 mmol/L (ref 3.5–5.1)
Sodium: 139 mmol/L (ref 135–145)
Total Bilirubin: 0.3 mg/dL (ref 0.3–1.2)
Total Protein: 6.9 g/dL (ref 6.5–8.1)

## 2019-10-26 LAB — CBC WITH DIFFERENTIAL (CANCER CENTER ONLY)
Abs Immature Granulocytes: 0.01 10*3/uL (ref 0.00–0.07)
Basophils Absolute: 0.1 10*3/uL (ref 0.0–0.1)
Basophils Relative: 1 %
Eosinophils Absolute: 0 10*3/uL (ref 0.0–0.5)
Eosinophils Relative: 1 %
HCT: 36.9 % (ref 36.0–46.0)
Hemoglobin: 12 g/dL (ref 12.0–15.0)
Immature Granulocytes: 0 %
Lymphocytes Relative: 42 %
Lymphs Abs: 3 10*3/uL (ref 0.7–4.0)
MCH: 30.7 pg (ref 26.0–34.0)
MCHC: 32.5 g/dL (ref 30.0–36.0)
MCV: 94.4 fL (ref 80.0–100.0)
Monocytes Absolute: 0.5 10*3/uL (ref 0.1–1.0)
Monocytes Relative: 7 %
Neutro Abs: 3.5 10*3/uL (ref 1.7–7.7)
Neutrophils Relative %: 49 %
Platelet Count: 219 10*3/uL (ref 150–400)
RBC: 3.91 MIL/uL (ref 3.87–5.11)
RDW: 12.2 % (ref 11.5–15.5)
WBC Count: 7.1 10*3/uL (ref 4.0–10.5)
nRBC: 0 % (ref 0.0–0.2)

## 2019-10-27 ENCOUNTER — Telehealth: Payer: Self-pay | Admitting: Oncology

## 2019-10-27 ENCOUNTER — Other Ambulatory Visit (HOSPITAL_COMMUNITY)
Admission: RE | Admit: 2019-10-27 | Discharge: 2019-10-27 | Disposition: A | Discharge status: Home / Self Care | Payer: Commercial Managed Care - PPO | Source: Ambulatory Visit | Attending: General Surgery | Admitting: General Surgery

## 2019-10-27 DIAGNOSIS — Z01812 Encounter for preprocedural laboratory examination: Secondary | ICD-10-CM | POA: Diagnosis not present

## 2019-10-27 DIAGNOSIS — Z20822 Contact with and (suspected) exposure to covid-19: Secondary | ICD-10-CM | POA: Diagnosis not present

## 2019-10-27 LAB — SARS CORONAVIRUS 2 (TAT 6-24 HRS): SARS Coronavirus 2: NEGATIVE

## 2019-10-27 NOTE — Telephone Encounter (Signed)
I talk with patient regarding schedule  

## 2019-10-27 NOTE — Progress Notes (Signed)

## 2019-10-30 ENCOUNTER — Ambulatory Visit: Payer: Commercial Managed Care - PPO | Admitting: Obstetrics and Gynecology

## 2019-10-31 ENCOUNTER — Ambulatory Visit (HOSPITAL_BASED_OUTPATIENT_CLINIC_OR_DEPARTMENT_OTHER)
Admission: RE | Admit: 2019-10-31 | Discharge: 2019-10-31 | Disposition: A | Discharge status: Home / Self Care | Payer: Commercial Managed Care - PPO | Attending: General Surgery | Admitting: General Surgery

## 2019-10-31 ENCOUNTER — Encounter (HOSPITAL_BASED_OUTPATIENT_CLINIC_OR_DEPARTMENT_OTHER): Payer: Self-pay | Admitting: General Surgery

## 2019-10-31 ENCOUNTER — Encounter (HOSPITAL_BASED_OUTPATIENT_CLINIC_OR_DEPARTMENT_OTHER)
Admission: RE | Disposition: A | Discharge status: Home / Self Care | Payer: Self-pay | Source: Home / Self Care | Attending: General Surgery

## 2019-10-31 ENCOUNTER — Ambulatory Visit (HOSPITAL_BASED_OUTPATIENT_CLINIC_OR_DEPARTMENT_OTHER): Payer: Commercial Managed Care - PPO | Admitting: Certified Registered"

## 2019-10-31 ENCOUNTER — Other Ambulatory Visit: Payer: Self-pay

## 2019-10-31 DIAGNOSIS — Z79899 Other long term (current) drug therapy: Secondary | ICD-10-CM | POA: Insufficient documentation

## 2019-10-31 DIAGNOSIS — Z87891 Personal history of nicotine dependence: Secondary | ICD-10-CM | POA: Insufficient documentation

## 2019-10-31 DIAGNOSIS — I10 Essential (primary) hypertension: Secondary | ICD-10-CM | POA: Insufficient documentation

## 2019-10-31 DIAGNOSIS — C50911 Malignant neoplasm of unspecified site of right female breast: Secondary | ICD-10-CM | POA: Insufficient documentation

## 2019-10-31 HISTORY — PX: RE-EXCISION OF BREAST LUMPECTOMY: SHX6048

## 2019-10-31 SURGERY — EXCISION, LESION, BREAST
Anesthesia: General | Site: Breast | Laterality: Right

## 2019-10-31 MED ORDER — CHLORHEXIDINE GLUCONATE CLOTH 2 % EX PADS: 6.0000 | MEDICATED_PAD | Freq: Once | CUTANEOUS | Status: DC

## 2019-10-31 MED ORDER — ACETAMINOPHEN 500 MG PO TABS
ORAL_TABLET | ORAL | Status: AC
  Filled 2019-10-31: qty 2

## 2019-10-31 MED ORDER — DEXAMETHASONE SODIUM PHOSPHATE 10 MG/ML IJ SOLN
INTRAMUSCULAR | Status: AC
  Filled 2019-10-31: qty 1

## 2019-10-31 MED ORDER — MIDAZOLAM HCL 2 MG/2ML IJ SOLN: 0.5000 mg | INTRAMUSCULAR | Status: DC | PRN

## 2019-10-31 MED ORDER — BUPIVACAINE HCL (PF) 0.25 % IJ SOLN
INTRAMUSCULAR | Status: DC | PRN
  Administered 2019-10-31: 10 mL

## 2019-10-31 MED ORDER — CEFAZOLIN SODIUM-DEXTROSE 2-4 GM/100ML-% IV SOLN
INTRAVENOUS | Status: AC
  Filled 2019-10-31: qty 100

## 2019-10-31 MED ORDER — ACETAMINOPHEN 500 MG PO TABS
1000.0000 mg | ORAL_TABLET | ORAL | Status: AC
Start: 2019-10-31 — End: 2019-10-31
  Administered 2019-10-31: 1000 mg via ORAL

## 2019-10-31 MED ORDER — OXYCODONE HCL 5 MG PO TABS
5.0000 mg | ORAL_TABLET | Freq: Once | ORAL | Status: AC
  Administered 2019-10-31: 5 mg via ORAL

## 2019-10-31 MED ORDER — MIDAZOLAM HCL 5 MG/5ML IJ SOLN
INTRAMUSCULAR | Status: DC | PRN
  Administered 2019-10-31: 2 mg via INTRAVENOUS

## 2019-10-31 MED ORDER — ONDANSETRON HCL 4 MG/2ML IJ SOLN
INTRAMUSCULAR | Status: AC
  Filled 2019-10-31: qty 2

## 2019-10-31 MED ORDER — OXYCODONE HCL 5 MG PO TABS
ORAL_TABLET | ORAL | Status: AC
  Filled 2019-10-31: qty 1

## 2019-10-31 MED ORDER — LIDOCAINE-EPINEPHRINE 0.5 %-1:200000 IJ SOLN
INTRAMUSCULAR | Status: DC | PRN
Start: 2019-10-31 — End: 2019-10-31
  Administered 2019-10-31: 10 mL

## 2019-10-31 MED ORDER — FENTANYL CITRATE (PF) 100 MCG/2ML IJ SOLN
INTRAMUSCULAR | Status: AC
  Filled 2019-10-31: qty 2

## 2019-10-31 MED ORDER — MIDAZOLAM HCL 2 MG/2ML IJ SOLN
INTRAMUSCULAR | Status: AC
  Filled 2019-10-31: qty 2

## 2019-10-31 MED ORDER — SCOPOLAMINE 1 MG/3DAYS TD PT72
1.0000 | MEDICATED_PATCH | Freq: Once | TRANSDERMAL | Status: DC
  Administered 2019-10-31: 1.5 mg via TRANSDERMAL

## 2019-10-31 MED ORDER — ACETAMINOPHEN 500 MG PO TABS
ORAL_TABLET | ORAL | Status: AC
  Filled 2019-10-31: qty 1

## 2019-10-31 MED ORDER — MIDAZOLAM HCL 2 MG/2ML IJ SOLN: 1.0000 mg | INTRAMUSCULAR | Status: DC | PRN

## 2019-10-31 MED ORDER — SCOPOLAMINE 1 MG/3DAYS TD PT72
MEDICATED_PATCH | TRANSDERMAL | Status: AC
  Filled 2019-10-31: qty 1

## 2019-10-31 MED ORDER — PROPOFOL 10 MG/ML IV BOLUS
INTRAVENOUS | Status: DC | PRN
  Administered 2019-10-31: 170 mg via INTRAVENOUS

## 2019-10-31 MED ORDER — FENTANYL CITRATE (PF) 250 MCG/5ML IJ SOLN
INTRAMUSCULAR | Status: DC | PRN
  Administered 2019-10-31 (×2): 25 ug via INTRAVENOUS
  Administered 2019-10-31: 50 ug via INTRAVENOUS

## 2019-10-31 MED ORDER — ONDANSETRON HCL 4 MG/2ML IJ SOLN
INTRAMUSCULAR | Status: DC | PRN
  Administered 2019-10-31: 4 mg via INTRAVENOUS

## 2019-10-31 MED ORDER — CEFAZOLIN SODIUM-DEXTROSE 2-4 GM/100ML-% IV SOLN
2.0000 g | INTRAVENOUS | Status: AC
  Administered 2019-10-31: 2 g via INTRAVENOUS

## 2019-10-31 MED ORDER — LACTATED RINGERS IV SOLN: INTRAVENOUS | Status: DC

## 2019-10-31 MED ORDER — LIDOCAINE 2% (20 MG/ML) 5 ML SYRINGE
INTRAMUSCULAR | Status: DC | PRN
  Administered 2019-10-31: 80 mg via INTRAVENOUS

## 2019-10-31 MED ORDER — EPHEDRINE SULFATE-NACL 50-0.9 MG/10ML-% IV SOSY
PREFILLED_SYRINGE | INTRAVENOUS | Status: DC | PRN
  Administered 2019-10-31 (×2): 10 mg via INTRAVENOUS

## 2019-10-31 SURGICAL SUPPLY — 48 items
BINDER BREAST 3XL (GAUZE/BANDAGES/DRESSINGS) IMPLANT
BINDER BREAST LRG (GAUZE/BANDAGES/DRESSINGS) ×1 IMPLANT
BINDER BREAST MEDIUM (GAUZE/BANDAGES/DRESSINGS) IMPLANT
BINDER BREAST XLRG (GAUZE/BANDAGES/DRESSINGS) IMPLANT
BINDER BREAST XXLRG (GAUZE/BANDAGES/DRESSINGS) IMPLANT
BLADE SURG 10 STRL SS (BLADE) ×3 IMPLANT
BLADE SURG 15 STRL LF DISP TIS (BLADE) ×2 IMPLANT
BLADE SURG 15 STRL SS (BLADE) ×3
CANISTER SUCT 1200ML W/VALVE (MISCELLANEOUS) ×3 IMPLANT
CHLORAPREP W/TINT 26 (MISCELLANEOUS) ×3 IMPLANT
CLIP VESOCCLUDE LG 6/CT (CLIP) ×3 IMPLANT
COVER BACK TABLE 60X90IN (DRAPES) ×3 IMPLANT
COVER MAYO STAND STRL (DRAPES) ×3 IMPLANT
DRAPE C-ARM 42X72 X-RAY (DRAPES) ×3 IMPLANT
DRAPE LAPAROSCOPIC ABDOMINAL (DRAPES) ×3 IMPLANT
DRAPE UTILITY XL STRL (DRAPES) ×3 IMPLANT
DRSG TEGADERM 4X4.75 (GAUZE/BANDAGES/DRESSINGS) IMPLANT
ELECT COATED BLADE 2.86 ST (ELECTRODE) ×3 IMPLANT
ELECT REM PT RETURN 9FT ADLT (ELECTROSURGICAL) ×3
ELECTRODE REM PT RTRN 9FT ADLT (ELECTROSURGICAL) ×2 IMPLANT
GAUZE SPONGE 4X4 12PLY STRL (GAUZE/BANDAGES/DRESSINGS) IMPLANT
GAUZE SPONGE 4X4 12PLY STRL LF (GAUZE/BANDAGES/DRESSINGS) ×3 IMPLANT
GLOVE BIO SURGEON STRL SZ 6 (GLOVE) ×3 IMPLANT
GLOVE BIO SURGEON STRL SZ 6.5 (GLOVE) ×2 IMPLANT
GLOVE BIOGEL PI IND STRL 6.5 (GLOVE) ×2 IMPLANT
GLOVE BIOGEL PI IND STRL 7.0 (GLOVE) IMPLANT
GLOVE BIOGEL PI INDICATOR 6.5 (GLOVE) ×1
GLOVE BIOGEL PI INDICATOR 7.0 (GLOVE) ×2
GOWN STRL REUS W/ TWL LRG LVL3 (GOWN DISPOSABLE) ×2 IMPLANT
GOWN STRL REUS W/TWL 2XL LVL3 (GOWN DISPOSABLE) ×3 IMPLANT
GOWN STRL REUS W/TWL LRG LVL3 (GOWN DISPOSABLE) ×6
KIT MARKER MARGIN INK (KITS) ×3 IMPLANT
NDL HYPO 25X1 1.5 SAFETY (NEEDLE) ×2 IMPLANT
NEEDLE HYPO 25X1 1.5 SAFETY (NEEDLE) ×3 IMPLANT
NS IRRIG 1000ML POUR BTL (IV SOLUTION) ×3 IMPLANT
PACK BASIN DAY SURGERY FS (CUSTOM PROCEDURE TRAY) ×3 IMPLANT
PENCIL SMOKE EVACUATOR (MISCELLANEOUS) ×3 IMPLANT
SLEEVE SCD COMPRESS KNEE MED (MISCELLANEOUS) ×3 IMPLANT
SPONGE LAP 18X18 RF (DISPOSABLE) ×3 IMPLANT
STRIP CLOSURE SKIN 1/2X4 (GAUZE/BANDAGES/DRESSINGS) ×3 IMPLANT
SUT MNCRL AB 4-0 PS2 18 (SUTURE) ×3 IMPLANT
SUT VIC AB 3-0 SH 27 (SUTURE) ×3
SUT VIC AB 3-0 SH 27X BRD (SUTURE) ×2 IMPLANT
SYR BULB 3OZ (MISCELLANEOUS) ×3 IMPLANT
SYR CONTROL 10ML LL (SYRINGE) ×3 IMPLANT
TOWEL GREEN STERILE FF (TOWEL DISPOSABLE) ×3 IMPLANT
TUBE CONNECTING 20X1/4 (TUBING) ×3 IMPLANT
YANKAUER SUCT BULB TIP NO VENT (SUCTIONS) ×3 IMPLANT

## 2019-10-31 NOTE — Anesthesia Procedure Notes (Signed)
Procedure Name: LMA Insertion Date/Time: 10/31/2019 9:59 AM Performed by: Myna Bright, CRNA Pre-anesthesia Checklist: Patient identified, Emergency Drugs available, Suction available and Patient being monitored Patient Re-evaluated:Patient Re-evaluated prior to induction Oxygen Delivery Method: Circle system utilized Preoxygenation: Pre-oxygenation with 100% oxygen Induction Type: IV induction Ventilation: Mask ventilation without difficulty LMA: LMA inserted LMA Size: 4.0 Tube type: Oral Number of attempts: 1 Placement Confirmation: positive ETCO2 and breath sounds checked- equal and bilateral Tube secured with: Tape Dental Injury: Teeth and Oropharynx as per pre-operative assessment

## 2019-10-31 NOTE — Anesthesia Preprocedure Evaluation (Addendum)
Anesthesia Evaluation  Patient identified by MRN, date of birth, ID band  Reviewed: Allergy & Precautions, NPO status , Patient's Chart, lab work & pertinent test results  History of Anesthesia Complications (+) PONV  Airway Mallampati: II  TM Distance: >3 FB     Dental   Pulmonary former smoker,    breath sounds clear to auscultation       Cardiovascular hypertension,  Rhythm:Regular Rate:Normal     Neuro/Psych    GI/Hepatic negative GI ROS, Neg liver ROS,   Endo/Other  negative endocrine ROS  Renal/GU negative Renal ROS     Musculoskeletal   Abdominal   Peds  Hematology   Anesthesia Other Findings   Reproductive/Obstetrics                             Anesthesia Physical Anesthesia Plan  ASA: II  Anesthesia Plan: General   Post-op Pain Management:    Induction: Intravenous  PONV Risk Score and Plan: 4 or greater and Ondansetron, Dexamethasone and Midazolam  Airway Management Planned: LMA  Additional Equipment:   Intra-op Plan:   Post-operative Plan:   Informed Consent: I have reviewed the patients History and Physical, chart, labs and discussed the procedure including the risks, benefits and alternatives for the proposed anesthesia with the patient or authorized representative who has indicated his/her understanding and acceptance.     Dental advisory given  Plan Discussed with: CRNA and Anesthesiologist  Anesthesia Plan Comments:         Anesthesia Quick Evaluation

## 2019-10-31 NOTE — Anesthesia Postprocedure Evaluation (Signed)
Anesthesia Post Note  Patient: Theresa [name]  Procedure(s) Performed: RIGHT RE-EXCISION OF BREAST LUMPECTOMY (Right Breast)     Patient location during evaluation: PACU Anesthesia Type: General Level of consciousness: awake Pain management: pain level controlled Vital Signs Assessment: post-procedure vital signs reviewed and stable Respiratory status: spontaneous breathing Cardiovascular status: stable Postop Assessment: no apparent nausea or vomiting Anesthetic complications: no    Last Vitals:  Vitals:   10/31/19 1100 10/31/19 1220  BP: 109/60 123/64  Pulse: 66 68  Resp: 14   Temp:  36.6 C  SpO2: 100% 100%    Last Pain:  Vitals:   10/31/19 1220  TempSrc: Oral  PainSc: 2                  Philamena Kramar

## 2019-10-31 NOTE — Interval H&P Note (Signed)
History and Physical Interval Note:  10/31/2019 9:36 AM  Theresa [name]  has presented today for surgery, with the diagnosis of RIGHT BREAST CANCER.  The various methods of treatment have been discussed with the patient and family. After consideration of risks, benefits and other options for treatment, the patient has consented to  Procedure(s): RIGHT RE-EXCISION OF BREAST LUMPECTOMY (Right) as a surgical intervention.  The patient's history has been reviewed, patient examined, no change in status, stable for surgery.  I have reviewed the patient's chart and labs.  Questions were answered to the patient's satisfaction.     Stark Klein

## 2019-10-31 NOTE — Discharge Instructions (Addendum)
[place] Office Phone Number [phone]  BREAST BIOPSY/ PARTIAL MASTECTOMY: POST OP INSTRUCTIONS  Always review your discharge instruction sheet given to you by the facility where your surgery was performed.  IF YOU HAVE DISABILITY OR FAMILY LEAVE FORMS, YOU MUST BRING THEM TO THE OFFICE FOR PROCESSING.  DO NOT GIVE THEM TO YOUR DOCTOR.  1. A prescription for pain medication may be given to you upon discharge.  Take your pain medication as prescribed, if needed.  If narcotic pain medicine is not needed, then you may take acetaminophen (Tylenol) or ibuprofen (Advil) as needed. 2. Take your usually prescribed medications unless otherwise directed 3. If you need a refill on your pain medication, please contact your pharmacy.  They will contact our office to request authorization.  Prescriptions will not be filled after 5pm or on week-ends. 4. You should eat very light the first 24 hours after surgery, such as soup, crackers, pudding, etc.  Resume your normal diet the day after surgery. 5. Most patients will experience some swelling and bruising in the breast.  Ice packs and a good support bra will help.  Swelling and bruising can take several days to resolve.  6. It is common to experience some constipation if taking pain medication after surgery.  Increasing fluid intake and taking a stool softener will usually help or prevent this problem from occurring.  A mild laxative (Milk of Magnesia or Miralax) should be taken according to package directions if there are no bowel movements after 48 hours. 7. Unless discharge instructions indicate otherwise, you may remove your bandages 48 hours after surgery, and you may shower at that time.  You may have steri-strips (small skin tapes) in place directly over the incision.  These strips should be left on the skin for 7-10 days.   Any sutures or staples will be removed at the office during your follow-up visit. 8. ACTIVITIES:  You may resume  regular daily activities (gradually increasing) beginning the next day.  Wearing a good support bra or sports bra (or the breast binder) minimizes pain and swelling.  You may have sexual intercourse when it is comfortable. a. You may drive when you no longer are taking prescription pain medication, you can comfortably wear a seatbelt, and you can safely maneuver your car and apply brakes. b. RETURN TO WORK:  __________1 week_______________ 9. You should see your doctor in the office for a follow-up appointment approximately two weeks after your surgery.  Your doctor's nurse will typically make your follow-up appointment when she calls you with your pathology report.  Expect your pathology report 2-3 business days after your surgery.  You may call to check if you do not hear from [place] after three days.   WHEN TO CALL YOUR DOCTOR: 1. Fever over 101.0 2. Nausea and/or vomiting. 3. Extreme swelling or bruising. 4. Continued bleeding from incision. 5. Increased pain, redness, or drainage from the incision.  The clinic staff is available to answer your questions during regular business hours.  Please don't hesitate to call and ask to speak to one of the nurses for clinical concerns.  If you have a medical emergency, go to the nearest emergency room or call 911.  A surgeon from [facility] is always on call at the hospital.  For further questions, please visit [email or web address]     Post Anesthesia Home Care Instructions  Activity: Get plenty of rest for the remainder of the day. A responsible individual must stay with you for  24 hours following the procedure.  For the next 24 hours, DO NOT: -Drive a car -Paediatric nurse -Drink alcoholic beverages -Take any medication unless instructed by your physician -Make any legal decisions or sign important papers.  Meals: Start with liquid foods such as gelatin or soup. Progress to regular foods as tolerated. Avoid greasy, spicy,  heavy foods. If nausea and/or vomiting occur, drink only clear liquids until the nausea and/or vomiting subsides. Call your physician if vomiting continues.  Special Instructions/Symptoms: Your throat may feel dry or sore from the anesthesia or the breathing tube placed in your throat during surgery. If this causes discomfort, gargle with warm salt water. The discomfort should disappear within 24 hours.  If you had a scopolamine patch placed behind your ear for the management of post- operative nausea and/or vomiting:  1. The medication in the patch is effective for 72 hours, after which it should be removed.  Wrap patch in a tissue and discard in the trash. Wash hands thoroughly with soap and water. 2. You may remove the patch earlier than 72 hours if you experience unpleasant side effects which may include dry mouth, dizziness or visual disturbances. 3. Avoid touching the patch. Wash your hands with soap and water after contact with the patch.  No tylenol until after 3:00pm today.

## 2019-10-31 NOTE — Transfer of Care (Signed)
Immediate Anesthesia Transfer of Care Note  Patient: Theresa [name]  Procedure(s) Performed: RIGHT RE-EXCISION OF BREAST LUMPECTOMY (Right Breast)  Patient Location: PACU  Anesthesia Type:General  Level of Consciousness: sedated and responds to stimulation  Airway & Oxygen Therapy: Patient Spontanous Breathing and Patient connected to face mask oxygen  Post-op Assessment: Report given to RN, Post -op Vital signs reviewed and stable and Patient moving all extremities  Post vital signs: Reviewed and stable  Last Vitals:  Vitals Value Taken Time  BP    Temp    Pulse 71 10/31/19 1037  Resp 13 10/31/19 1037  SpO2 100 % 10/31/19 1037  Vitals shown include unvalidated device data.  Last Pain:  Vitals:   10/31/19 0904  TempSrc: Oral  PainSc: 0-No pain      Patients Stated Pain Goal: 3 ([id number])  Complications: No apparent anesthesia complications

## 2019-10-31 NOTE — Op Note (Signed)
Re-excisional right Breast Lumpectomy   Indications: This patient presents with history of positive superior margins after partial mastectomy for right breast cancer   Pre-operative Diagnosis: right breast cancer   Post-operative Diagnosis: right breast cancer   Surgeon: Stark Klein   Assistants: n/a   Anesthesia: General anesthesia with local  ASA Class: 2   Procedure Details  The patient was seen in the Holding Room. The risks, benefits, complications, treatment options, and expected outcomes were discussed with the patient. The possibilities of reaction to medication, pulmonary aspiration, bleeding, infection, the need for additional procedures, failure to diagnose a condition, and creating a complication requiring transfusion or operation were discussed with the patient. The patient concurred with the proposed plan, giving informed consent. The site of surgery properly noted/marked. The patient was taken to Operating Room # 8, identified, and the procedure verified as re-excision of right breast cancer.  After induction of anesthesia, the right breast and chest were prepped and draped in standard fashion.  The lumpectomy was performed by reopening the prior incision. Seroma was aspirated. The mastopexy sutures were removed. Additional margins were taken at the superior borders of the partial mastectomy cavity. Dissection was carried down to the pectoral fascia. Orientation sutures were placed in the specimens. Hemostasis was achieved with cautery. The wound was irrigated and closed with a 3-0 Vicryl deep dermal interrupted and a 4-0 Monocryl subcuticular closure in layers. Local anesthetic was administered.    Sterile dressings were applied. At the end of the operation, all sponge, instrument, and needle counts were correct.   Findings:  grossly clear surgical margins, posterior margin remains pectoralis; inferior, anterior and medial margins are skin  Estimated Blood Loss: Minimal    Drains: none   Specimens: additional superior margins.   Complications: None; patient tolerated the procedure well.   Disposition: PACU - hemodynamically stable.   Condition: stable

## 2019-11-01 LAB — SURGICAL PATHOLOGY

## 2019-11-06 ENCOUNTER — Encounter: Payer: Self-pay | Admitting: *Deleted

## 2019-11-15 NOTE — Progress Notes (Signed)
Location of Breast Cancer: Right Breast  Histology per Pathology Report:  112/20 Diagnosis 1. Breast, right, needle core biopsy, 0.6 cm mass at 3:30 o'clock, 5 cm fn - INVASIVE MAMMARY CARCINOMA, SEE COMMENT. - MAMMARY CARCINOMA IN SITU. 2. Breast, right, needle core biopsy, 1.1 cm mass at 3:30 o'clock, 4 cm fn - INVASIVE MAMMARY CARCINOMA, SEE COMMENT. - MAMMARY CARCINOMA IN SITU.  Receptor Status: ER(95%), PR (NEG), Her2-neu (NEG), Ki-(5%)  10/04/19 FINAL MICROSCOPIC DIAGNOSIS: A. BREAST, RIGHT, LUMPECTOMY: - Invasive ductal carcinoma, Nottingham grade 2 of 3, 2.8 cm - Carcinoma extends to the inked superior margin - Lymphovascular space invasion present - Previous biopsy site changes present (x3) - Fibrocystic changes with calcifications - See oncology table and comment below B. LYMPH NODE, RIGHT AXILLARY #1, SENTINEL, BIOPSY: - Metastatic carcinoma involving one lymph node (1/1) C. LYMPH NODE, RIGHT AXILLARY #2, SENTINEL, BIOPSY: - Metastatic carcinoma involving one lymph node (1/1) D. LYMPH NODE, RIGHT AXILLARY #3, SENTINEL, BIOPSY: - No carcinoma identified in one lymph node (0/1)  10/31/19 FINAL MICROSCOPIC DIAGNOSIS: A. RIGHT BREAST, RE-EXCISION OF SUPERIOR MARGIN: - Prior procedure site changes in otherwise benign breast tissue. - No carcinoma identified.  Did patient present with symptoms or was this found on screening mammography?: It was found on a screening mammogram.   Past/Anticipated interventions by surgeon, if any: 10/04/19 Dr. Barry Dienes Right Breast Radioactive seed bracketed lumpectomy, mastopexy, right breast seed localized excisional biopsy and sentinel lymph node biopsy  10/31/19 Dr. Barry Dienes Re-excisional right Breast Lumpectomy   Past/Anticipated interventions by medical oncology, if any:  10/26/19 Dr. Jana Hakim ASSESSMENT: 64 y.o. [place] woman status post right lower inner quadrant biopsy 07/03/2019 for a clinicaly multiple T1c N0, stage IA invasive  ductal carcinoma, grade 2, E-cadherin positive, strongly estrogen receptor positive but progesterone receptor negative and HER-2 not amplified, with an MIB-1 of 5%             (a) breast MRI 08/07/2019 showed 2 additional suspicious areas in the right breast and 1 in the left breast              (b) biopsy of all 3 areas 08/21/2019 showed no malignancy (1) tamoxifen started neoadjuvantly 07/26/2019 (2) right lumpectomy and sentinel lymph node sampling 10/04/2019 showed a pT2 pN1, stage IIB invasive ductal carcinoma, grade 2, with positive lymphovascular invasion and a positive superior margin             (a) 3 sentinel lymph nodes removed, one with macro metastatic deposit, 1 with a micrometastatic deposit             (b) additional surgery for margin clearance (3) MammaPrint on the 10/04/2019 sample shows a low risk luminal A predicting a 5-year metastasis free survival of 96% without chemotherapy, and a chemotherapy benefit of less than 1.5% (4) adjuvant radiation pending  Lymphedema issues, if any:  She denies. She has good arm mobility.   Pain issues, if any:  She reports occasional nerve pain to her surgery site.   SAFETY ISSUES:  Prior radiation? No  Pacemaker/ICD? No  Possible current pregnancy? No  Is the patient on methotrexate? No  Current Complaints / other details:    BP (!) 115/49 (BP Location: Left Arm, Patient Position: Sitting)   Pulse 78   Temp 98.7 F (37.1 C) (Temporal)   Resp 18   Ht 5' 4"  (1.626 m)   Wt 142 lb 6 oz (64.6 kg)   LMP 09/01/2007 (Exact Date)   SpO2 99%  BMI 24.44 kg/m     Damarcus Reggio, Stephani Police, RN 11/15/2019,1:47 PM

## 2019-11-20 ENCOUNTER — Encounter: Payer: Self-pay | Admitting: General Surgery

## 2019-11-20 NOTE — Progress Notes (Signed)
Radiation Oncology         [phone] ________________________________  Name: Theresa [name] MRN: [id number]  Date: 11/21/2019  DOB: [birth year]  Follow-Up Visit Note in person  Outpatient  CC: [name], MD  Magrinat, Virgie Dad, MD  Diagnosis:      ICD-10-CM   1. Malignant neoplasm of lower-inner quadrant of right breast of female, estrogen receptor positive ([place])  C50.311    Z17.0      Cancer Staging Malignant neoplasm of lower-inner quadrant of right breast of female, estrogen receptor positive ([place]) Staging form: Breast, AJCC 8th Edition - Clinical stage from 07/26/2019: Stage IA (cT1c, cN0, cM0, G2, ER+, PR-, HER2-) - Unsigned - Pathologic stage from 10/04/2019: Stage IIB (pT2, pN1a(sn), cM0, G2, ER+, PR-, HER2-) - Signed by Gardenia Phlegm, NP on 10/18/2019   CHIEF COMPLAINT: Here to discuss management of right breast cancer  Narrative:  The patient returns today for follow-up to discuss radiation treatment options. She was seen in the multidisciplinary breast clinic on 07/26/2019.     Since consultation date, she underwent breast MRI on 08/07/2019 revealing: non-mass enhancement surrounding biopsy-proven sites of malignancy in the right breast, measuring 3.1 cm; suspicious 7 mm enhancing superior central right breast mass; indeterminate 6 mm upper-inner left breast mass.    She proceeded to biopsies of the three suspicious areas seen on MRI on 08/21/2019. Pathology from all three areas was negative for malignancy. A fibroadenoma was noted in the upper-central right breast.  She opted to proceed with right lumpectomy and sentinel lymph node biopsy on date of 10/04/2019 with pathology report revealing: tumor size of 2.8 cm; histology of ductal carcinoma; margin status to invasive disease of positive (superior); nodal status of positive (2/3, one with macro and one with micro); Grade 2.  Mammaprint testing was performed on the final surgical sample. Results showed low  risk.  She underwent re-excision of the positive margin on 10/31/2019, which showed benign pathology.  Symptomatically, the patient reports: She is doing well.  She has received her first Covid shot.  She is healing well from surgery.  She has occasional nerve pain at her surgical site.  She has good arm mobility and denies lymphedema.         ALLERGIES:  is allergic to flagyl [metronidazole] and other.  Meds: Current Outpatient Medications  Medication Sig Dispense Refill  . CRANBERRY PO Take by mouth.    . Lactobacillus Rhamnosus, GG, (CULTURELLE PO) Take by mouth.    . loperamide (IMODIUM) 2 MG capsule Take 2 mg by mouth daily.    . Magnesium 100 MG TABS Take by mouth.    . Melatonin 10 MG CAPS Take by mouth.    . mupirocin ointment (BACTROBAN) 2 % APPLY TO AFFECTED AREA EVERY DAY    . nystatin ointment (MYCOSTATIN) Apply 1 application topically 2 (two) times daily. Apply to affected area for up to 7 days. 30 g 0  . OVER THE COUNTER MEDICATION Vitamin C, B complex & quercetin    . Pumpkin Seed-Soy Germ (AZO BLADDER CONTROL/GO-LESS PO) Take by mouth.    . tamoxifen (NOLVADEX) 20 MG tablet Take 20 mg by mouth daily.    Marland Kitchen triamcinolone (KENALOG) 0.025 % ointment Apply 1 application topically 2 (two) times daily. Use for one week. 30 g 0  . UNABLE TO FIND Turmeric XL Liquid: takes daily    . valsartan (DIOVAN) 80 MG tablet Take 80 mg by mouth at bedtime.    Marland Kitchen  VITAMIN D PO Take by mouth.     No current facility-administered medications for this encounter.    Physical Findings:  height is 5' 4"  (1.626 m) and weight is 142 lb 6 oz (64.6 kg). Her temporal temperature is 98.7 F (37.1 C). Her blood pressure is 115/49 (abnormal) and her pulse is 78. Her respiration is 18 and oxygen saturation is 99%. .     General: Alert and oriented, in no acute distress  Musculoskeletal: Good range of motion in right shoulder  Psychiatric: Judgment and insight are intact. Affect is appropriate. Breast  exam reveals excellent healing at axillary and right lumpectomy scars.  There is some moderate concavity and convexity to her breast contour at the lumpectomy site  Lab Findings: Lab Results  Component Value Date   WBC 7.1 10/26/2019   HGB 12.0 10/26/2019   HCT 36.9 10/26/2019   MCV 94.4 10/26/2019   PLT 219 10/26/2019    Radiographic Findings: No results found.  Impression/Plan: This is a lovely 64 year old woman with a history of right breast cancer   We discussed adjuvant radiotherapy today.  I recommend 6 weeks directed at the right breast and regional lymph nodes in order to reduce the risk of locoregional recurrence by 2/3.  The risks, benefits and side effects of this treatment were discussed in detail.  She understands that radiotherapy is associated with skin irritation and fatigue in the acute setting. Late effects can include lymphedema, cosmetic changes and rare injury to internal organs.  She is enthusiastic about proceeding with treatment. A consent form has been signed and placed in her chart.  We will proceed with CT simulation today and start her treatment next week.  On date of service, in total, I spent 30 minutes on this encounter. _____________________________________   Eppie Gibson, MD   This document serves as a record of services personally performed by Eppie Gibson, MD. It was created on her behalf by Wilburn Mylar, a trained medical scribe. The creation of this record is based on the scribe's personal observations and the provider's statements to them. This document has been checked and approved by the attending provider.

## 2019-11-21 ENCOUNTER — Ambulatory Visit
Admission: RE | Admit: 2019-11-21 | Discharge: 2019-11-21 | Disposition: A | Discharge status: Home / Self Care | Payer: Commercial Managed Care - PPO | Source: Ambulatory Visit | Attending: Radiation Oncology | Admitting: Radiation Oncology

## 2019-11-21 ENCOUNTER — Other Ambulatory Visit: Payer: Self-pay

## 2019-11-21 ENCOUNTER — Encounter: Payer: Self-pay | Admitting: Radiation Oncology

## 2019-11-21 VITALS — BP 115/49 | HR 78 | Temp 98.7°F | Resp 18 | Ht 64.0 in | Wt 142.4 lb

## 2019-11-21 DIAGNOSIS — C50311 Malignant neoplasm of lower-inner quadrant of right female breast: Secondary | ICD-10-CM

## 2019-11-21 DIAGNOSIS — Z510 Encounter for antineoplastic radiation therapy: Secondary | ICD-10-CM | POA: Insufficient documentation

## 2019-11-21 DIAGNOSIS — Z79899 Other long term (current) drug therapy: Secondary | ICD-10-CM | POA: Diagnosis not present

## 2019-11-21 DIAGNOSIS — Z170 Estrogen receptor positive status [ER+]: Secondary | ICD-10-CM

## 2019-11-27 ENCOUNTER — Ambulatory Visit
Admission: RE | Admit: 2019-11-27 | Discharge: 2019-11-27 | Disposition: A | Discharge status: Home / Self Care | Payer: Commercial Managed Care - PPO | Source: Ambulatory Visit | Attending: Radiation Oncology | Admitting: Radiation Oncology

## 2019-11-27 ENCOUNTER — Other Ambulatory Visit: Payer: Self-pay

## 2019-11-27 DIAGNOSIS — C50311 Malignant neoplasm of lower-inner quadrant of right female breast: Secondary | ICD-10-CM

## 2019-11-27 DIAGNOSIS — Z510 Encounter for antineoplastic radiation therapy: Secondary | ICD-10-CM | POA: Diagnosis not present

## 2019-11-27 MED ORDER — ALRA NON-METALLIC DEODORANT (RAD-ONC)
1.0000 "application " | Freq: Once | TOPICAL | Status: AC
  Administered 2019-11-27: 1 via TOPICAL

## 2019-11-27 MED ORDER — SONAFINE EX EMUL
1.0000 "application " | Freq: Two times a day (BID) | CUTANEOUS | Status: DC
  Administered 2019-11-27: 1 via TOPICAL

## 2019-11-27 NOTE — Addendum Note (Signed)
Encounter addended by: Amparo Bristol, LPN on: [id number] [id number] PM  Actions taken: Patient Education assessment filed

## 2019-11-27 NOTE — Addendum Note (Signed)
Encounter addended by: Amparo Bristol, LPN on: 624THL [id number] PM  Actions taken: [facility] administration accepted

## 2019-11-28 ENCOUNTER — Encounter: Payer: Self-pay | Admitting: *Deleted

## 2019-11-28 ENCOUNTER — Ambulatory Visit
Admission: RE | Admit: 2019-11-28 | Discharge: 2019-11-28 | Disposition: A | Discharge status: Home / Self Care | Payer: Commercial Managed Care - PPO | Source: Ambulatory Visit | Attending: Radiation Oncology | Admitting: Radiation Oncology

## 2019-11-28 DIAGNOSIS — Z510 Encounter for antineoplastic radiation therapy: Secondary | ICD-10-CM | POA: Diagnosis not present

## 2019-11-29 ENCOUNTER — Telehealth: Payer: Self-pay

## 2019-11-29 ENCOUNTER — Ambulatory Visit
Admission: RE | Admit: 2019-11-29 | Discharge: 2019-11-29 | Disposition: A | Discharge status: Home / Self Care | Payer: Commercial Managed Care - PPO | Source: Ambulatory Visit | Attending: Radiation Oncology | Admitting: Radiation Oncology

## 2019-11-29 ENCOUNTER — Other Ambulatory Visit: Payer: Self-pay

## 2019-11-29 DIAGNOSIS — Z510 Encounter for antineoplastic radiation therapy: Secondary | ICD-10-CM | POA: Diagnosis not present

## 2019-11-29 NOTE — Telephone Encounter (Signed)
I have sent a staff message to Dr. Isidore Moos, the patient's radiation oncologist, regarding the plan for hysterectomy on 01/15/20.   I am waiting for a response back.

## 2019-11-29 NOTE — Telephone Encounter (Signed)
Patient called to discuss rescheduling hysterectomy surgery.

## 2019-11-29 NOTE — Telephone Encounter (Signed)
-----   Message from Eppie Gibson, MD sent at 11/29/2019  4:12 PM EDT ----- Regarding: RE: planning for hysterectomy That should be fine. She may still have some fatigue for a few weeks after RT, but if you'd like to do this surgery ASAP, that sounds realistic.  Thanks, Sarah ----- Message ----- From: Nunzio Cobbs, MD Sent: 11/29/2019  12:44 PM EDT To: Eppie Gibson, MD Subject: planning for hysterectomy                      Hello Dr. Isidore Moos,   I am the gynecologist caring for Harrold Donath.   I am trying to plan for her laparoscopic hysterectomy for recurrent postmenopausal bleeding. I expect her pathology report to be benign.  I understand she is doing radiation for her breast cancer and will complete her care on 01/05/20.  Will it be appropriate to proceed with her hysterectomy on 01/15/20?  Thank you,   Josefa Half, MD [facility]

## 2019-11-29 NOTE — Telephone Encounter (Signed)
Spoke with patient. Patient will be done with radiation on 01/05/2020. Oncologist advised patient may proceed with TLH/BSO/collection of pelvic washings/CYSTO starting 01/15/2020. Surgery scheduled for 01/16/2020 at [facility]. Pre op scheduled for 12/25/2019 at 1 pm with Dr.Silva. COVID test scheduled for 01/12/2020 at 2:35 pm. Aware to quarantine after test until surgery. 1 week post op scheduled for 01/23/2020 at 1 pm with Dr.Silva. 6 week post op scheduled for 02/27/2020 at 4 pm with Dr.Silva. Patient is agreeable to all dates and times. Surgery instructions reviewed and mailed to patient's verified home address on file.  Cc: Hayley Carder  Routing to provider for review.

## 2019-11-30 ENCOUNTER — Other Ambulatory Visit: Payer: Self-pay

## 2019-11-30 ENCOUNTER — Ambulatory Visit
Admission: RE | Admit: 2019-11-30 | Discharge: 2019-11-30 | Disposition: A | Discharge status: Home / Self Care | Payer: Commercial Managed Care - PPO | Source: Ambulatory Visit | Attending: Radiation Oncology | Admitting: Radiation Oncology

## 2019-11-30 DIAGNOSIS — C50311 Malignant neoplasm of lower-inner quadrant of right female breast: Secondary | ICD-10-CM | POA: Insufficient documentation

## 2019-11-30 DIAGNOSIS — Z510 Encounter for antineoplastic radiation therapy: Secondary | ICD-10-CM | POA: Diagnosis present

## 2019-11-30 DIAGNOSIS — Z170 Estrogen receptor positive status [ER+]: Secondary | ICD-10-CM | POA: Diagnosis present

## 2019-11-30 NOTE — Telephone Encounter (Signed)
Spoke with patient. Advised of message as seen below from Dr.Squire. Patient verbalizes understanding and would like to proceed with surgery as scheduled for 01/16/2020.  Routing to provider and will close encounter.

## 2019-12-01 ENCOUNTER — Ambulatory Visit
Admission: RE | Admit: 2019-12-01 | Discharge: 2019-12-01 | Disposition: A | Discharge status: Home / Self Care | Payer: Commercial Managed Care - PPO | Source: Ambulatory Visit | Attending: Radiation Oncology | Admitting: Radiation Oncology

## 2019-12-01 ENCOUNTER — Other Ambulatory Visit: Payer: Self-pay

## 2019-12-01 DIAGNOSIS — C50311 Malignant neoplasm of lower-inner quadrant of right female breast: Secondary | ICD-10-CM | POA: Diagnosis not present

## 2019-12-04 ENCOUNTER — Ambulatory Visit: Payer: Commercial Managed Care - PPO | Admitting: Obstetrics and Gynecology

## 2019-12-04 ENCOUNTER — Other Ambulatory Visit: Payer: Self-pay

## 2019-12-04 ENCOUNTER — Ambulatory Visit
Admission: RE | Admit: 2019-12-04 | Discharge: 2019-12-04 | Disposition: A | Discharge status: Home / Self Care | Payer: Commercial Managed Care - PPO | Source: Ambulatory Visit | Attending: Radiation Oncology | Admitting: Radiation Oncology

## 2019-12-04 DIAGNOSIS — C50311 Malignant neoplasm of lower-inner quadrant of right female breast: Secondary | ICD-10-CM | POA: Diagnosis not present

## 2019-12-05 ENCOUNTER — Ambulatory Visit
Admission: RE | Admit: 2019-12-05 | Discharge: 2019-12-05 | Disposition: A | Discharge status: Home / Self Care | Payer: Commercial Managed Care - PPO | Source: Ambulatory Visit | Attending: Radiation Oncology | Admitting: Radiation Oncology

## 2019-12-05 ENCOUNTER — Other Ambulatory Visit: Payer: Self-pay

## 2019-12-05 DIAGNOSIS — C50311 Malignant neoplasm of lower-inner quadrant of right female breast: Secondary | ICD-10-CM | POA: Diagnosis not present

## 2019-12-05 MED ORDER — SONAFINE EX EMUL
1.0000 "application " | Freq: Two times a day (BID) | CUTANEOUS | Status: DC
  Administered 2019-12-05: 1 via TOPICAL

## 2019-12-06 ENCOUNTER — Ambulatory Visit
Admission: RE | Admit: 2019-12-06 | Discharge: 2019-12-06 | Disposition: A | Discharge status: Home / Self Care | Payer: Commercial Managed Care - PPO | Source: Ambulatory Visit | Attending: Radiation Oncology | Admitting: Radiation Oncology

## 2019-12-06 ENCOUNTER — Other Ambulatory Visit: Payer: Self-pay

## 2019-12-06 DIAGNOSIS — C50311 Malignant neoplasm of lower-inner quadrant of right female breast: Secondary | ICD-10-CM | POA: Diagnosis not present

## 2019-12-07 ENCOUNTER — Ambulatory Visit
Admission: RE | Admit: 2019-12-07 | Discharge: 2019-12-07 | Disposition: A | Discharge status: Home / Self Care | Payer: Commercial Managed Care - PPO | Source: Ambulatory Visit | Attending: Radiation Oncology | Admitting: Radiation Oncology

## 2019-12-07 ENCOUNTER — Other Ambulatory Visit: Payer: Self-pay

## 2019-12-07 DIAGNOSIS — C50311 Malignant neoplasm of lower-inner quadrant of right female breast: Secondary | ICD-10-CM | POA: Diagnosis not present

## 2019-12-08 ENCOUNTER — Ambulatory Visit
Admission: RE | Admit: 2019-12-08 | Discharge: 2019-12-08 | Disposition: A | Discharge status: Home / Self Care | Payer: Commercial Managed Care - PPO | Source: Ambulatory Visit | Attending: Radiation Oncology | Admitting: Radiation Oncology

## 2019-12-08 ENCOUNTER — Other Ambulatory Visit: Payer: Self-pay

## 2019-12-08 DIAGNOSIS — C50311 Malignant neoplasm of lower-inner quadrant of right female breast: Secondary | ICD-10-CM | POA: Diagnosis not present

## 2019-12-11 ENCOUNTER — Other Ambulatory Visit: Payer: Self-pay

## 2019-12-11 ENCOUNTER — Other Ambulatory Visit: Payer: Self-pay | Admitting: Radiation Oncology

## 2019-12-11 ENCOUNTER — Ambulatory Visit
Admission: RE | Admit: 2019-12-11 | Discharge: 2019-12-11 | Disposition: A | Discharge status: Home / Self Care | Payer: Commercial Managed Care - PPO | Source: Ambulatory Visit | Attending: Radiation Oncology | Admitting: Radiation Oncology

## 2019-12-11 DIAGNOSIS — Z170 Estrogen receptor positive status [ER+]: Secondary | ICD-10-CM

## 2019-12-11 DIAGNOSIS — C50311 Malignant neoplasm of lower-inner quadrant of right female breast: Secondary | ICD-10-CM | POA: Diagnosis not present

## 2019-12-11 MED ORDER — HYDROCORTISONE 2.5 % EX CREA: TOPICAL_CREAM | Freq: Three times a day (TID) | CUTANEOUS | 3 refills | Status: DC | PRN

## 2019-12-12 ENCOUNTER — Other Ambulatory Visit: Payer: Self-pay

## 2019-12-12 ENCOUNTER — Ambulatory Visit
Admission: RE | Admit: 2019-12-12 | Discharge: 2019-12-12 | Disposition: A | Discharge status: Home / Self Care | Payer: Commercial Managed Care - PPO | Source: Ambulatory Visit | Attending: Radiation Oncology | Admitting: Radiation Oncology

## 2019-12-12 DIAGNOSIS — C50311 Malignant neoplasm of lower-inner quadrant of right female breast: Secondary | ICD-10-CM | POA: Diagnosis not present

## 2019-12-13 ENCOUNTER — Other Ambulatory Visit: Payer: Self-pay

## 2019-12-13 ENCOUNTER — Ambulatory Visit
Admission: RE | Admit: 2019-12-13 | Discharge: 2019-12-13 | Disposition: A | Discharge status: Home / Self Care | Payer: Commercial Managed Care - PPO | Source: Ambulatory Visit | Attending: Radiation Oncology | Admitting: Radiation Oncology

## 2019-12-13 DIAGNOSIS — C50311 Malignant neoplasm of lower-inner quadrant of right female breast: Secondary | ICD-10-CM | POA: Diagnosis not present

## 2019-12-14 ENCOUNTER — Ambulatory Visit
Admission: RE | Admit: 2019-12-14 | Discharge: 2019-12-14 | Disposition: A | Discharge status: Home / Self Care | Payer: Commercial Managed Care - PPO | Source: Ambulatory Visit | Attending: Radiation Oncology | Admitting: Radiation Oncology

## 2019-12-14 ENCOUNTER — Other Ambulatory Visit: Payer: Self-pay

## 2019-12-14 DIAGNOSIS — C50311 Malignant neoplasm of lower-inner quadrant of right female breast: Secondary | ICD-10-CM | POA: Diagnosis not present

## 2019-12-15 ENCOUNTER — Ambulatory Visit
Admission: RE | Admit: 2019-12-15 | Discharge: 2019-12-15 | Disposition: A | Discharge status: Home / Self Care | Payer: Commercial Managed Care - PPO | Source: Ambulatory Visit | Attending: Radiation Oncology | Admitting: Radiation Oncology

## 2019-12-15 DIAGNOSIS — C50311 Malignant neoplasm of lower-inner quadrant of right female breast: Secondary | ICD-10-CM | POA: Diagnosis not present

## 2019-12-18 ENCOUNTER — Other Ambulatory Visit: Payer: Self-pay

## 2019-12-18 ENCOUNTER — Ambulatory Visit
Admission: RE | Admit: 2019-12-18 | Discharge: 2019-12-18 | Disposition: A | Discharge status: Home / Self Care | Payer: Commercial Managed Care - PPO | Source: Ambulatory Visit | Attending: Radiation Oncology | Admitting: Radiation Oncology

## 2019-12-18 DIAGNOSIS — C50311 Malignant neoplasm of lower-inner quadrant of right female breast: Secondary | ICD-10-CM | POA: Diagnosis not present

## 2019-12-19 ENCOUNTER — Ambulatory Visit
Admission: RE | Admit: 2019-12-19 | Discharge: 2019-12-19 | Disposition: A | Discharge status: Home / Self Care | Payer: Commercial Managed Care - PPO | Source: Ambulatory Visit | Attending: Radiation Oncology | Admitting: Radiation Oncology

## 2019-12-19 ENCOUNTER — Other Ambulatory Visit: Payer: Self-pay

## 2019-12-19 DIAGNOSIS — C50311 Malignant neoplasm of lower-inner quadrant of right female breast: Secondary | ICD-10-CM | POA: Diagnosis not present

## 2019-12-20 ENCOUNTER — Ambulatory Visit
Admission: RE | Admit: 2019-12-20 | Discharge: 2019-12-20 | Disposition: A | Discharge status: Home / Self Care | Payer: Commercial Managed Care - PPO | Source: Ambulatory Visit | Attending: Radiation Oncology | Admitting: Radiation Oncology

## 2019-12-20 ENCOUNTER — Other Ambulatory Visit: Payer: Self-pay

## 2019-12-20 DIAGNOSIS — C50311 Malignant neoplasm of lower-inner quadrant of right female breast: Secondary | ICD-10-CM | POA: Diagnosis not present

## 2019-12-21 ENCOUNTER — Other Ambulatory Visit: Payer: Self-pay

## 2019-12-21 ENCOUNTER — Ambulatory Visit
Admission: RE | Admit: 2019-12-21 | Discharge: 2019-12-21 | Disposition: A | Discharge status: Home / Self Care | Payer: Commercial Managed Care - PPO | Source: Ambulatory Visit | Attending: Radiation Oncology | Admitting: Radiation Oncology

## 2019-12-21 DIAGNOSIS — C50311 Malignant neoplasm of lower-inner quadrant of right female breast: Secondary | ICD-10-CM | POA: Diagnosis not present

## 2019-12-22 ENCOUNTER — Ambulatory Visit
Admission: RE | Admit: 2019-12-22 | Discharge: 2019-12-22 | Disposition: A | Discharge status: Home / Self Care | Payer: Commercial Managed Care - PPO | Source: Ambulatory Visit | Attending: Radiation Oncology | Admitting: Radiation Oncology

## 2019-12-22 ENCOUNTER — Other Ambulatory Visit: Payer: Self-pay

## 2019-12-22 DIAGNOSIS — C50311 Malignant neoplasm of lower-inner quadrant of right female breast: Secondary | ICD-10-CM | POA: Diagnosis not present

## 2019-12-25 ENCOUNTER — Other Ambulatory Visit: Payer: Self-pay

## 2019-12-25 ENCOUNTER — Ambulatory Visit
Admission: RE | Admit: 2019-12-25 | Discharge: 2019-12-25 | Disposition: A | Discharge status: Home / Self Care | Payer: Commercial Managed Care - PPO | Source: Ambulatory Visit | Attending: Radiation Oncology | Admitting: Radiation Oncology

## 2019-12-25 ENCOUNTER — Encounter: Payer: Self-pay | Admitting: Obstetrics and Gynecology

## 2019-12-25 ENCOUNTER — Ambulatory Visit (INDEPENDENT_AMBULATORY_CARE_PROVIDER_SITE_OTHER): Payer: Commercial Managed Care - PPO | Admitting: Obstetrics and Gynecology

## 2019-12-25 ENCOUNTER — Ambulatory Visit: Payer: Commercial Managed Care - PPO | Admitting: Radiation Oncology

## 2019-12-25 VITALS — BP 118/82 | HR 70 | Temp 97.0°F | Ht 64.0 in | Wt 145.4 lb

## 2019-12-25 DIAGNOSIS — N83201 Unspecified ovarian cyst, right side: Secondary | ICD-10-CM

## 2019-12-25 DIAGNOSIS — D219 Benign neoplasm of connective and other soft tissue, unspecified: Secondary | ICD-10-CM

## 2019-12-25 DIAGNOSIS — C50311 Malignant neoplasm of lower-inner quadrant of right female breast: Secondary | ICD-10-CM | POA: Diagnosis not present

## 2019-12-25 DIAGNOSIS — N950 Postmenopausal bleeding: Secondary | ICD-10-CM

## 2019-12-25 NOTE — Progress Notes (Signed)
GYNECOLOGY  VISIT   HPI: 64 y.o.   Married  Caucasian  female   G1P1001 with Patient's last menstrual period was 09/01/2007 (exact date).   here for surgical consult for recurrent postmenopausal bleeding, uterine fibroids, and a right ovarian cyst.   Patient did have vaginal bleeding which was brown blood 2 days ago.   On her pelvic US on 07/06/19, she has mutliple fibroids, the largest being 49 mm. Her EMS was 2.9 mm. She has a 27 x 23 mm calcified right ovarian cyst noted  Her left ovary was normal. Saline US showed no filling defects.  CA125 was 7.8 on 07/06/19.  Her EMB showed benign atrophy on 07/06/19.   She was scheduled for hysterectomy surgery but this was cancelled due to need to complete care first for right breast cancer.   She had lumpectomy with positive margins and positive lymph nodes. She was started on Tamoxifen in November, 2020.  She then had re-excision of the positive margin and is now finishing radiation therapy in 9 days.   She had negative genetic testing.   She is taking Effexor for menopausal vasomotor symptoms.   She had a bladder sling for urinary incontinence by Dr. Terance Hart at [facility] in early 2000.   She completed her Covid vaccination.   GYNECOLOGIC HISTORY: Patient's last menstrual period was 09/01/2007 (exact date). Contraception:  Tubal Menopausal hormone therapy:  none Last mammogram: 08-07-19 MRI --see Epic Last pap smear: 06-23-19 Neg:Neg HR HPV,06-09-17 Neg:Neg HR HPV,05-02-15 Neg:Neg HRNeg        OB History    Gravida  1   Para  1   Term  1   Preterm      AB      Living  1     SAB      TAB      Ectopic      Multiple      Live Births                 Patient Active Problem List   Diagnosis Date Noted  . Genetic testing 08/04/2019  . Family history of uterine cancer   . Family history of stomach cancer   . Family history of bone cancer   . Family history of brain cancer   . Postmenopausal  bleeding 07/09/2019  . Right ovarian cyst 07/09/2019  . Malignant neoplasm of lower-inner quadrant of right breast of female, estrogen receptor positive ([place]) 07/06/2019  . History of Clostridioides difficile infection 06/17/2018  . Fibroids 08/06/2017  . Breast mass in female-left at 12:00-benign 08/29/2012    Past Medical History:  Diagnosis Date  . Contact lens/glasses fitting    wears contacts or glasses  . Diarrhea    --post gallbladder surgery  . Family history of bone cancer   . Family history of brain cancer   . Family history of stomach cancer   . Family history of uterine cancer   . Fibroids   . Hypertension   . No pertinent past medical history   . PONV (postoperative nausea and vomiting)   . Right ovarian cyst   . Vertigo     Past Surgical History:  Procedure Laterality Date  . BLADDER SUSPENSION    . BREAST BIOPSY  09/08/2012   Procedure: BREAST BIOPSY;  Surgeon: Odis Hollingshead, MD;  Location: [place];  Service: General;  Laterality: Left;  remove left breast mass  . BREAST LUMPECTOMY WITH RADIOACTIVE SEED AND SENTINEL LYMPH  NODE BIOPSY Right 10/04/2019   Procedure: RIGHT BREAST LUMPECTOMY WITH BRACKETED RADIOACTIVE SEEDS, RIGHT BREAST RADIOACTIVE SEED GUIDED EXCISION BIOPSY, AND RIGHT SENTINEL LYMPH NODE BIOPSY;  Surgeon: Stark Klein, MD;  Location: [place];  Service: General;  Laterality: Right;  . BREAST SURGERY     lumpectomy x2  . BUNIONECTOMY Right 01/2016  . CHOLECYSTECTOMY    . COLONOSCOPY    . MASTOPEXY Right 10/04/2019   Procedure: RIGHT BREAST MASTOPEXY;  Surgeon: Stark Klein, MD;  Location: [place];  Service: General;  Laterality: Right;  . RE-EXCISION OF BREAST LUMPECTOMY Right 10/31/2019   Procedure: RIGHT RE-EXCISION OF BREAST LUMPECTOMY;  Surgeon: Stark Klein, MD;  Location: [place];  Service: General;  Laterality: Right;  . TUBAL LIGATION      Current Outpatient  Medications  Medication Sig Dispense Refill  . CRANBERRY PO Take by mouth.    . hydrocortisone 2.5 % cream Apply topically 3 (three) times daily as needed. 28 g 3  . Lactobacillus Rhamnosus, GG, (CULTURELLE PO) Take by mouth.    . loperamide (IMODIUM) 2 MG capsule Take 2 mg by mouth daily.    . Magnesium 100 MG TABS Take by mouth.    . Melatonin 10 MG CAPS Take by mouth.    . mupirocin ointment (BACTROBAN) 2 % APPLY TO AFFECTED AREA EVERY DAY    . nystatin ointment (MYCOSTATIN) Apply 1 application topically 2 (two) times daily. Apply to affected area for up to 7 days. 30 g 0  . OVER THE COUNTER MEDICATION Vitamin C, B complex & quercetin    . Pumpkin Seed-Soy Germ (AZO BLADDER CONTROL/GO-LESS PO) Take by mouth.    . tamoxifen (NOLVADEX) 20 MG tablet Take 20 mg by mouth daily.    Marland Kitchen triamcinolone (KENALOG) 0.025 % ointment Apply 1 application topically 2 (two) times daily. Use for one week. 30 g 0  . UNABLE TO FIND Turmeric XL Liquid: takes daily    . valsartan (DIOVAN) 80 MG tablet Take 80 mg by mouth at bedtime.    Marland Kitchen venlafaxine XR (EFFEXOR-XR) 37.5 MG 24 hr capsule Take 37.5 mg by mouth daily.    Marland Kitchen VITAMIN D PO Take by mouth.     No current facility-administered medications for this visit.     ALLERGIES: Flagyl [metronidazole] and Other  Family History  Problem Relation Age of Onset  . Hypertension Father   . Stomach cancer Father        may have been colon, diagnosed in his 85s  . Depression Mother   . Dementia Mother   . Thyroid disease Sister   . Heart disease Sister   . Brain cancer Maternal Aunt 80  . Cancer Maternal Grandmother        undetermined type, diagnosed in her 68s  . Bone cancer Paternal Grandfather 22  . Uterine cancer Paternal Aunt 52  . Uterine cancer Cousin        paternal 1st cousin, diagnosed in her late 27s  . Uterine cancer Cousin 48       paternal 1st cousin    Social History   Socioeconomic History  . Marital status: Married    Spouse name:  Daryl   . Number of children: Not on file  . Years of education: Not on file  . Highest education level: Not on file  Occupational History  . Not on file  Tobacco Use  . Smoking status: Former Smoker    Types: Cigarettes  Quit date: 03/15/1996    Years since quitting: 23.7  . Smokeless tobacco: Never Used  Substance and Sexual Activity  . Alcohol use: No    Alcohol/week: 0.0 standard drinks  . Drug use: No  . Sexual activity: Not Currently    Partners: Male    Birth control/protection: Post-menopausal, Surgical    Comment: BTL  Other Topics Concern  . Not on file  Social History Narrative  . Not on file   Social Determinants of Health   Financial Resource Strain:   . Difficulty of Paying Living Expenses:   Food Insecurity:   . Worried About Charity fundraiser in the Last Year:   . Arboriculturist in the Last Year:   Transportation Needs:   . Film/video editor (Medical):   Marland Kitchen Lack of Transportation (Non-Medical):   Physical Activity:   . Days of Exercise per Week:   . Minutes of Exercise per Session:   Stress:   . Feeling of Stress :   Social Connections:   . Frequency of Communication with Friends and Family:   . Frequency of Social Gatherings with Friends and Family:   . Attends Religious Services:   . Active Member of Clubs or Organizations:   . Attends Archivist Meetings:   Marland Kitchen Marital Status:   Intimate Partner Violence:   . Fear of Current or Ex-Partner:   . Emotionally Abused:   Marland Kitchen Physically Abused:   . Sexually Abused:     Review of Systems  All other systems reviewed and are negative.   PHYSICAL EXAMINATION:    BP 118/82   Pulse 70 Comment: irregular  Temp (!) 97 F (36.1 C) (Temporal)   Ht 5\' 4"  (1.626 m)   Wt 145 lb 6.4 oz (66 kg)   LMP 09/01/2007 (Exact Date)   BMI 24.96 kg/m     General appearance: alert, cooperative and appears stated age Head: Normocephalic, without obvious abnormality, atraumatic Neck: no adenopathy,  supple, symmetrical, trachea midline and thyroid normal to inspection and palpation Lungs: clear to auscultation bilaterally Heart: regular rate and rhythm Abdomen: soft, non-tender, no masses,  no organomegaly Extremities: extremities normal, atraumatic, no cyanosis or edema Skin: Skin color, texture, turgor normal. No rashes or lesions. No abnormal inguinal nodes palpated Neurologic: Grossly normal  Pelvic: External genitalia:  no lesions              Urethra:  normal appearing urethra with no masses, tenderness or lesions              Bartholins and Skenes: normal                 Vagina: normal appearing vagina with normal color and discharge, no lesions              Cervix: no lesions                Bimanual Exam:  Uterus:  9 week size, nontenderr.              Adnexa: no mass, fullness, tenderness        Chaperone was present for exam.  ASSESSMENT  Hx recurrent postmenopausal bleeding.  Uterine fibroids.  Benign endometrial biopsy.  Right ovarian cyst.  Normal CA125.  Status post lumpectomy of right breast x 2 with positive nodes.  On Tamoxifen.  Hx bladder suspension.   PLAN  Return for pelvic US and EMB due to recurrence of bleeding, now on Tamoxifen.  Will expect to proceed with total laparoscopic hysterectomy with bilateral salpingo-oophorectomy, collection of pelvic washings. We discussed vaginal morcellation in an Alexis bag. Risks, benefits, and alternatives reviewed with the patient who wishes to proceed.  Surgical expectations and recovery discussed.   An After Visit Summary was printed and given to the patient.

## 2019-12-25 NOTE — Progress Notes (Signed)
Patient scheduled while on office for PUS with EMB on 12/28/19 at [place], consult to follow with Dr. Quincy Simmonds. Advised patient to take Motrin 800 mg with food and water one hour before procedure. Order placed for precert. Patient verbalizes understanding and is agreeable.

## 2019-12-26 ENCOUNTER — Other Ambulatory Visit: Payer: Self-pay

## 2019-12-26 ENCOUNTER — Ambulatory Visit
Admission: RE | Admit: 2019-12-26 | Discharge: 2019-12-26 | Disposition: A | Discharge status: Home / Self Care | Payer: Commercial Managed Care - PPO | Source: Ambulatory Visit | Attending: Radiation Oncology | Admitting: Radiation Oncology

## 2019-12-26 DIAGNOSIS — C50311 Malignant neoplasm of lower-inner quadrant of right female breast: Secondary | ICD-10-CM | POA: Diagnosis not present

## 2019-12-27 ENCOUNTER — Ambulatory Visit
Admission: RE | Admit: 2019-12-27 | Discharge: 2019-12-27 | Disposition: A | Discharge status: Home / Self Care | Payer: Commercial Managed Care - PPO | Source: Ambulatory Visit | Attending: Radiation Oncology | Admitting: Radiation Oncology

## 2019-12-27 ENCOUNTER — Ambulatory Visit: Payer: Commercial Managed Care - PPO | Admitting: Radiation Oncology

## 2019-12-27 ENCOUNTER — Other Ambulatory Visit: Payer: Self-pay

## 2019-12-27 DIAGNOSIS — C50311 Malignant neoplasm of lower-inner quadrant of right female breast: Secondary | ICD-10-CM | POA: Diagnosis not present

## 2019-12-28 ENCOUNTER — Other Ambulatory Visit: Payer: Self-pay

## 2019-12-28 ENCOUNTER — Encounter: Payer: Self-pay | Admitting: Obstetrics and Gynecology

## 2019-12-28 ENCOUNTER — Ambulatory Visit (INDEPENDENT_AMBULATORY_CARE_PROVIDER_SITE_OTHER): Payer: Commercial Managed Care - PPO | Admitting: Obstetrics and Gynecology

## 2019-12-28 ENCOUNTER — Ambulatory Visit
Admission: RE | Admit: 2019-12-28 | Discharge: 2019-12-28 | Disposition: A | Discharge status: Home / Self Care | Payer: Commercial Managed Care - PPO | Source: Ambulatory Visit | Attending: Radiation Oncology | Admitting: Radiation Oncology

## 2019-12-28 ENCOUNTER — Other Ambulatory Visit (HOSPITAL_COMMUNITY)
Admission: RE | Admit: 2019-12-28 | Discharge: 2019-12-28 | Disposition: A | Discharge status: Home / Self Care | Payer: Commercial Managed Care - PPO | Source: Ambulatory Visit | Attending: Obstetrics and Gynecology | Admitting: Obstetrics and Gynecology

## 2019-12-28 ENCOUNTER — Ambulatory Visit (INDEPENDENT_AMBULATORY_CARE_PROVIDER_SITE_OTHER): Payer: Commercial Managed Care - PPO

## 2019-12-28 VITALS — BP 130/72 | HR 80 | Temp 97.7°F | Ht 64.0 in | Wt 143.8 lb

## 2019-12-28 DIAGNOSIS — N950 Postmenopausal bleeding: Secondary | ICD-10-CM | POA: Insufficient documentation

## 2019-12-28 DIAGNOSIS — Z79810 Long term (current) use of selective estrogen receptor modulators (SERMs): Secondary | ICD-10-CM | POA: Insufficient documentation

## 2019-12-28 DIAGNOSIS — C50311 Malignant neoplasm of lower-inner quadrant of right female breast: Secondary | ICD-10-CM | POA: Diagnosis not present

## 2019-12-28 NOTE — Progress Notes (Signed)
GYNECOLOGY  VISIT   HPI: 64 y.o.   Married  Caucasian  female   G1P1001 with Patient's last menstrual period was 09/01/2007 (exact date).   here for  Recurrent postmenopausal bleeding.   Patient is on Tamoxifen for breast cancer treatment.   Her last EMB was benign in November 2020.   Hysterectomy is planned for recurrent postmenopausal bleeding, fibroids, and right ovarian cyst.   Patient is very tearful today about having another biopsy and about her increased medical care needs.   GYNECOLOGIC HISTORY: Patient's last menstrual period was 09/01/2007 (exact date). Contraception:  Postmenopausal Menopausal hormone therapy:  none Last mammogram:  08-07-19 MRI --see Epic Last pap smear:   06-23-19 Neg:Neg HR HPV,06-09-17 Neg:Neg HR HPV,05-02-15 Neg:Neg HRNeg        OB History    Gravida  1   Para  1   Term  1   Preterm      AB      Living  1     SAB      TAB      Ectopic      Multiple      Live Births                 Patient Active Problem List   Diagnosis Date Noted  . Genetic testing 08/04/2019  . Family history of uterine cancer   . Family history of stomach cancer   . Family history of bone cancer   . Family history of brain cancer   . Postmenopausal bleeding 07/09/2019  . Right ovarian cyst 07/09/2019  . Malignant neoplasm of lower-inner quadrant of right breast of female, estrogen receptor positive ([name]) 07/06/2019  . History of Clostridioides difficile infection 06/17/2018  . Fibroids 08/06/2017  . Breast mass in female-left at 12:00-benign 08/29/2012    Past Medical History:  Diagnosis Date  . Contact lens/glasses fitting    wears contacts or glasses  . Diarrhea    --post gallbladder surgery  . Family history of bone cancer   . Family history of brain cancer   . Family history of stomach cancer   . Family history of uterine cancer   . Fibroids   . Hypertension   . No pertinent past medical history   . PONV (postoperative nausea and  vomiting)   . Right ovarian cyst   . Vertigo     Past Surgical History:  Procedure Laterality Date  . BLADDER SUSPENSION    . BREAST BIOPSY  09/08/2012   Procedure: BREAST BIOPSY;  Surgeon: Odis Hollingshead, MD;  Location: [place];  Service: General;  Laterality: Left;  remove left breast mass  . BREAST LUMPECTOMY WITH RADIOACTIVE SEED AND SENTINEL LYMPH NODE BIOPSY Right 10/04/2019   Procedure: RIGHT BREAST LUMPECTOMY WITH BRACKETED RADIOACTIVE SEEDS, RIGHT BREAST RADIOACTIVE SEED GUIDED EXCISION BIOPSY, AND RIGHT SENTINEL LYMPH NODE BIOPSY;  Surgeon: Stark Klein, MD;  Location: [place];  Service: General;  Laterality: Right;  . BREAST SURGERY     lumpectomy x2  . BUNIONECTOMY Right 01/2016  . CHOLECYSTECTOMY    . COLONOSCOPY    . MASTOPEXY Right 10/04/2019   Procedure: RIGHT BREAST MASTOPEXY;  Surgeon: Stark Klein, MD;  Location: [place];  Service: General;  Laterality: Right;  . RE-EXCISION OF BREAST LUMPECTOMY Right 10/31/2019   Procedure: RIGHT RE-EXCISION OF BREAST LUMPECTOMY;  Surgeon: Stark Klein, MD;  Location: [place];  Service: General;  Laterality: Right;  .  TUBAL LIGATION      Current Outpatient Medications  Medication Sig Dispense Refill  . Ascorbic Acid (VITAMIN C) 100 MG tablet Take 100 mg by mouth daily.    . Cholecalciferol (VITAMIN D) 10 MCG/ML LIQD Take 1 drop by mouth in the morning and at bedtime.     Marland Kitchen CRANBERRY PO Take 1 tablet by mouth in the morning and at bedtime.     . hydrocortisone 2.5 % cream Apply topically 3 (three) times daily as needed. 28 g 3  . Lactobacillus Rhamnosus, GG, (CULTURELLE PO) Take 1 capsule by mouth daily.     Marland Kitchen loperamide (IMODIUM) 2 MG capsule Take 2 mg by mouth in the morning and at bedtime.     . Magnesium 100 MG TABS Take 100 mg by mouth daily.     . Melatonin 10 MG CAPS Take 10 mg by mouth at bedtime.     . Pumpkin Seed-Soy Germ (AZO BLADDER CONTROL/GO-LESS  PO) Take 1 tablet by mouth in the morning and at bedtime.     Marland Kitchen QUERCETIN PO Take 1 tablet by mouth in the morning and at bedtime.    . tamoxifen (NOLVADEX) 20 MG tablet Take 20 mg by mouth daily.    . TURMERIC PO Take 5 mLs by mouth daily.    . valsartan (DIOVAN) 80 MG tablet Take 80 mg by mouth at bedtime.    Marland Kitchen venlafaxine XR (EFFEXOR-XR) 37.5 MG 24 hr capsule Take 37.5 mg by mouth daily.    . vitamin E 180 MG (400 UNITS) capsule Take 400 Units by mouth daily.    Marland Kitchen nystatin ointment (MYCOSTATIN) Apply 1 application topically 2 (two) times daily. Apply to affected area for up to 7 days. (Patient not taking: Reported on 12/26/2019) 30 g 0  . triamcinolone (KENALOG) 0.025 % ointment Apply 1 application topically 2 (two) times daily. Use for one week. (Patient not taking: Reported on 12/26/2019) 30 g 0   No current facility-administered medications for this visit.     ALLERGIES: Flagyl [metronidazole] and Other  Family History  Problem Relation Age of Onset  . Hypertension Father   . Stomach cancer Father        may have been colon, diagnosed in his 77s  . Depression Mother   . Dementia Mother   . Thyroid disease Sister   . Heart disease Sister   . Brain cancer Maternal Aunt 80  . Cancer Maternal Grandmother        undetermined type, diagnosed in her 22s  . Bone cancer Paternal Grandfather 68  . Uterine cancer Paternal Aunt 66  . Uterine cancer Cousin        paternal 1st cousin, diagnosed in her late 9s  . Uterine cancer Cousin 94       paternal 1st cousin    Social History   Socioeconomic History  . Marital status: Married    Spouse name: Daryl   . Number of children: Not on file  . Years of education: Not on file  . Highest education level: Not on file  Occupational History  . Not on file  Tobacco Use  . Smoking status: Former Smoker    Types: Cigarettes    Quit date: 03/15/1996    Years since quitting: 23.8  . Smokeless tobacco: Never Used  Substance and Sexual  Activity  . Alcohol use: No    Alcohol/week: 0.0 standard drinks  . Drug use: No  . Sexual activity: Not Currently  Partners: Male    Birth control/protection: Post-menopausal, Surgical    Comment: BTL  Other Topics Concern  . Not on file  Social History Narrative  . Not on file   Social Determinants of Health   Financial Resource Strain:   . Difficulty of Paying Living Expenses:   Food Insecurity:   . Worried About Charity fundraiser in the Last Year:   . Arboriculturist in the Last Year:   Transportation Needs:   . Film/video editor (Medical):   Marland Kitchen Lack of Transportation (Non-Medical):   Physical Activity:   . Days of Exercise per Week:   . Minutes of Exercise per Session:   Stress:   . Feeling of Stress :   Social Connections:   . Frequency of Communication with Friends and Family:   . Frequency of Social Gatherings with Friends and Family:   . Attends Religious Services:   . Active Member of Clubs or Organizations:   . Attends Archivist Meetings:   Marland Kitchen Marital Status:   Intimate Partner Violence:   . Fear of Current or Ex-Partner:   . Emotionally Abused:   Marland Kitchen Physically Abused:   . Sexually Abused:     Review of Systems  Constitutional: Negative.   HENT: Negative.   Eyes: Negative.   Respiratory: Negative.   Cardiovascular: Negative.   Gastrointestinal: Negative.   Endocrine: Negative.   Genitourinary: Negative.   Musculoskeletal: Negative.   Skin: Negative.   Allergic/Immunologic: Negative.   Neurological: Negative.   Hematological: Negative.   Psychiatric/Behavioral: Negative.     PHYSICAL EXAMINATION:    BP 130/72 (BP Location: Left Arm, Patient Position: Sitting, Cuff Size: Normal)   Pulse 80   Temp 97.7 F (36.5 C) (Temporal)   Ht 5\' 4"  (1.626 m)   Wt 143 lb 12.8 oz (65.2 kg)   LMP 09/01/2007 (Exact Date)   BMI 24.68 kg/m     General appearance: alert, cooperative and appears stated age   Pelvic US Uterus with multiple  fibroids.  No change. EMS 5.24.   Right ovary with calcified cyst.  No change. Normal left ovary.  No free fluid.   Endometrial biopsy Consent for procedure.  Sterile prep with Hibiclens.  Paracervical block with 10 cc 1% lidocaine, lot [id number], exp 07/31/20. Pipelle passed to 7 cm x 2 .  Tissue to pathology.  Minimal EBL.  No complications.   Chaperone was present for exam.  ASSESSMENT  Recurrent postmenopausal bleeding.  Fibroids.  Right ovarian cyst.  Stress due to medical diagnoses.  PLAN  FU EMB. I expect a benign dx and told the patient this.  Will be proceeding with her surgery as scheduled unless there is an expected dx of endometrial cancer.  Ibuprofen given to patient following biopsy.  I recommended she ask for counseling support through the [facility].    An After Visit Summary was printed and given to the patient.

## 2019-12-28 NOTE — Progress Notes (Signed)
Encounter reviewed by Dr. Rane Dumm Amundson C. Silva.  

## 2019-12-29 ENCOUNTER — Other Ambulatory Visit: Payer: Self-pay

## 2019-12-29 ENCOUNTER — Telehealth: Payer: Self-pay | Admitting: *Deleted

## 2019-12-29 ENCOUNTER — Ambulatory Visit
Admission: RE | Admit: 2019-12-29 | Discharge: 2019-12-29 | Disposition: A | Discharge status: Home / Self Care | Payer: Commercial Managed Care - PPO | Source: Ambulatory Visit | Attending: Radiation Oncology | Admitting: Radiation Oncology

## 2019-12-29 DIAGNOSIS — C50311 Malignant neoplasm of lower-inner quadrant of right female breast: Secondary | ICD-10-CM | POA: Diagnosis not present

## 2019-12-29 LAB — SURGICAL PATHOLOGY

## 2019-12-29 NOTE — Telephone Encounter (Signed)
Message left to return call to Triage Nurse at [phone].    

## 2019-12-29 NOTE — Telephone Encounter (Signed)
-----   Message from Nunzio Cobbs, MD sent at 12/29/2019  2:35 PM EDT ----- Please inform patient of her benign endometrial biopsy.  There is no hyperplasia or malignancy.  Ok to proceed with surgery with me as planned.

## 2020-01-01 ENCOUNTER — Ambulatory Visit
Admission: RE | Admit: 2020-01-01 | Discharge: 2020-01-01 | Disposition: A | Discharge status: Home / Self Care | Payer: Commercial Managed Care - PPO | Source: Ambulatory Visit | Attending: Radiation Oncology | Admitting: Radiation Oncology

## 2020-01-01 ENCOUNTER — Other Ambulatory Visit: Payer: Self-pay

## 2020-01-01 DIAGNOSIS — Z510 Encounter for antineoplastic radiation therapy: Secondary | ICD-10-CM | POA: Diagnosis not present

## 2020-01-01 DIAGNOSIS — C50311 Malignant neoplasm of lower-inner quadrant of right female breast: Secondary | ICD-10-CM | POA: Insufficient documentation

## 2020-01-01 DIAGNOSIS — Z170 Estrogen receptor positive status [ER+]: Secondary | ICD-10-CM | POA: Diagnosis present

## 2020-01-01 NOTE — Telephone Encounter (Signed)
Spoke with patient, advised of results as seen below per Dr. Quincy Simmonds. Patient verbalizes understanding and is agreeable.   Encounter closed.

## 2020-01-02 ENCOUNTER — Other Ambulatory Visit: Payer: Self-pay

## 2020-01-02 ENCOUNTER — Ambulatory Visit
Admission: RE | Admit: 2020-01-02 | Discharge: 2020-01-02 | Disposition: A | Discharge status: Home / Self Care | Payer: Commercial Managed Care - PPO | Source: Ambulatory Visit | Attending: Radiation Oncology | Admitting: Radiation Oncology

## 2020-01-02 DIAGNOSIS — Z510 Encounter for antineoplastic radiation therapy: Secondary | ICD-10-CM | POA: Diagnosis not present

## 2020-01-03 ENCOUNTER — Other Ambulatory Visit: Payer: Self-pay

## 2020-01-03 ENCOUNTER — Ambulatory Visit
Admission: RE | Admit: 2020-01-03 | Discharge: 2020-01-03 | Disposition: A | Discharge status: Home / Self Care | Payer: Commercial Managed Care - PPO | Source: Ambulatory Visit | Attending: Radiation Oncology | Admitting: Radiation Oncology

## 2020-01-03 DIAGNOSIS — Z510 Encounter for antineoplastic radiation therapy: Secondary | ICD-10-CM | POA: Diagnosis not present

## 2020-01-04 ENCOUNTER — Other Ambulatory Visit: Payer: Self-pay

## 2020-01-04 ENCOUNTER — Ambulatory Visit
Admission: RE | Admit: 2020-01-04 | Discharge: 2020-01-04 | Disposition: A | Discharge status: Home / Self Care | Payer: Commercial Managed Care - PPO | Source: Ambulatory Visit | Attending: Radiation Oncology | Admitting: Radiation Oncology

## 2020-01-04 ENCOUNTER — Encounter: Payer: Self-pay | Admitting: *Deleted

## 2020-01-04 DIAGNOSIS — Z510 Encounter for antineoplastic radiation therapy: Secondary | ICD-10-CM | POA: Diagnosis not present

## 2020-01-05 ENCOUNTER — Encounter: Payer: Self-pay | Admitting: Radiation Oncology

## 2020-01-05 ENCOUNTER — Ambulatory Visit
Admission: RE | Admit: 2020-01-05 | Discharge: 2020-01-05 | Disposition: A | Discharge status: Home / Self Care | Payer: Commercial Managed Care - PPO | Source: Ambulatory Visit | Attending: Radiation Oncology | Admitting: Radiation Oncology

## 2020-01-05 ENCOUNTER — Other Ambulatory Visit: Payer: Self-pay

## 2020-01-05 DIAGNOSIS — Z510 Encounter for antineoplastic radiation therapy: Secondary | ICD-10-CM | POA: Diagnosis not present

## 2020-01-08 ENCOUNTER — Telehealth: Payer: Self-pay | Admitting: Obstetrics and Gynecology

## 2020-01-08 NOTE — Telephone Encounter (Signed)
Spoke with patient. Patient is asking when her appointments are for PAT with [facility]. States she received a call and did not understand where she needed to be as she was driving at the time. Advised will have a phone call visit with the nurse 01/09/2020 at 10 am. Will have PAT labs on 01/09/2020 at 1:30 pm. Patient is agreeable and verbalizes understanding.  Routing to provider and will close encounter.

## 2020-01-08 NOTE — Telephone Encounter (Signed)
Patient received call from hospital for testing before surgery but is not sure where she is going.

## 2020-01-08 NOTE — Patient Instructions (Signed)
DUE TO COVID-19 ONLY ONE VISITOR IS ALLOWED IN WAITING ROOM (VISITOR WILL HAVE A TEMPERATURE CHECK ON ARRIVAL AND MUST WEAR A FACE MASK THE ENTIRE TIME.)  ONCE YOU ARE ADMITTED TO YOUR PRIVATE ROOM, THE SAME ONE VISITOR IS ALLOWED TO VISIT DURING VISITING HOURS ONLY.  Your COVID swab testing is scheduled for Friday, Jan 12, 2020 at 2:35PM , You must self quarantine after your testing per handout given to you at the testing site.  ([facility] up testing enter pre-surgical testing line)    Your procedure is scheduled on: Tuesday, Jan 16, 2020  Report to [facility] AT  5:30 A. M.   Call this number if you have problems the morning of surgery:  [phone].   OUR ADDRESS IS [address].  WE ARE LOCATED IN THE [place]                                   [facility].                                     REMEMBER:  DO NOT EAT FOOD  AFTER MIDNIGHT .    MAY HAVE LIQUIDS UNTIL 4:30 AM DAY OF SURGERY  CLEAR LIQUID DIET  Foods Allowed                                                                     Foods Excluded  Water, Black Coffee and tea, regular and decaf                             liquids that you cannot  Plain Jell-O in any flavor  (No red)                                           see through such as: Fruit ices (not with fruit pulp)                                     milk, soups, orange juice  Iced Popsicles (No red)                                    All solid food Carbonated beverages, regular and diet                                    Apple juices Sports drinks like Gatorade (No red) Lightly seasoned clear broth or consume(fat free) Sugar, honey syrup  Sample Menu Breakfast  Lunch                                     Supper Cranberry juice                    Beef broth                            Chicken broth Jell-O                                     Grape juice                            Apple juice Coffee or tea                        Jell-O                                      Popsicle                                                Coffee or tea                        Coffee or tea  BRUSH YOUR TEETH THE MORNING OF SURGERY.  TAKE THESE MEDICATIONS MORNING OF SURGERY WITH A SIP OF WATER:  VENLAFAXINE, TAMOXIFEN  DO NOT WEAR JEWERLY, MAKE UP, OR NAIL POLISH.  DO NOT WEAR LOTIONS, POWDERS, PERFUMES/COLOGNE OR DEODORANT.  DO NOT SHAVE FOR 24 HOURS PRIOR TO DAY OF SURGERY.  CONTACTS, GLASSES, OR DENTURES MAY NOT BE WORN TO SURGERY.                                    [facility] IS NOT RESPONSIBLE  FOR ANY BELONGINGS.          BRING ALL PRESCRIPTION MEDICATIONS WITH YOU THE DAY OF SURGERY IN ORIGINAL CONTAINERS                                                               [facility] - Preparing for Surgery Before surgery, you can play an important role.  Because skin is not sterile, your skin needs to be as free of germs as possible.  You can reduce the number of germs on your skin by washing with CHG (chlorahexidine gluconate) soap before surgery.  CHG is an antiseptic cleaner which kills germs and bonds with the skin to continue killing germs even after washing. Please DO NOT use if you have an allergy to CHG or antibacterial soaps.  If your skin becomes reddened/irritated stop using the CHG and inform your nurse when you arrive at Short Stay. Do not  shave (including legs and underarms) for at least 48 hours prior to the first CHG shower.  You may shave your face/neck.  Please follow these instructions carefully:  1.  Shower with CHG Soap the night before surgery and the  morning of surgery.  2.  If you choose to wash your hair, wash your hair first as usual with your normal  shampoo.  3.  After you shampoo, rinse your hair and body thoroughly to remove the shampoo.                             4.  Use CHG as you would any other liquid soap.  You can  apply chg directly to the skin and wash.  Gently with a scrungie or clean washcloth.  5.  Apply the CHG Soap to your body ONLY FROM THE NECK DOWN.   Do   not use on face/ open                           Wound or open sores. Avoid contact with eyes, ears mouth and   genitals (private parts).                       Wash face,  Genitals (private parts) with your normal soap.             6.  Wash thoroughly, paying special attention to the area where your    surgery  will be performed.  7.  Thoroughly rinse your body with warm water from the neck down.  8.  DO NOT shower/wash with your normal soap after using and rinsing off the CHG Soap.                9.  Pat yourself dry with a clean towel.            10.  Wear clean pajamas.            11.  Place clean sheets on your bed the night of your first shower and do not  sleep with pets. Day of Surgery : Do not apply any lotions/deodorants the morning of surgery.  Please wear clean clothes to the hospital/surgery center.  FAILURE TO FOLLOW THESE INSTRUCTIONS MAY RESULT IN THE CANCELLATION OF YOUR SURGERY  PATIENT SIGNATURE_________________________________  NURSE SIGNATURE__________________________________  ________________________________________________________________________  WHAT IS A BLOOD TRANSFUSION? Blood Transfusion Information  A transfusion is the replacement of blood or some of its parts. Blood is made up of multiple cells which provide different functions.  Red blood cells carry oxygen and are used for blood loss replacement.  White blood cells fight against infection.  Platelets control bleeding.  Plasma helps clot blood.  Other blood products are available for specialized needs, such as hemophilia or other clotting disorders. BEFORE THE TRANSFUSION  Who gives blood for transfusions?   Healthy volunteers who are fully evaluated to make sure their blood is safe. This is blood bank blood. Transfusion therapy is the safest it  has ever been in the practice of medicine. Before blood is taken from a donor, a complete history is taken to make sure that person has no history of diseases nor engages in risky social behavior (examples are intravenous drug use or sexual activity with multiple partners). The donor's travel history is screened to minimize risk of transmitting infections, such as malaria. The donated blood is  tested for signs of infectious diseases, such as HIV and hepatitis. The blood is then tested to be sure it is compatible with you in order to minimize the chance of a transfusion reaction. If you or a relative donates blood, this is often done in anticipation of surgery and is not appropriate for emergency situations. It takes many days to process the donated blood. RISKS AND COMPLICATIONS Although transfusion therapy is very safe and saves many lives, the main dangers of transfusion include:   Getting an infectious disease.  Developing a transfusion reaction. This is an allergic reaction to something in the blood you were given. Every precaution is taken to prevent this. The decision to have a blood transfusion has been considered carefully by your caregiver before blood is given. Blood is not given unless the benefits outweigh the risks. AFTER THE TRANSFUSION  Right after receiving a blood transfusion, you will usually feel much better and more energetic. This is especially true if your red blood cells have gotten low (anemic). The transfusion raises the level of the red blood cells which carry oxygen, and this usually causes an energy increase.  The nurse administering the transfusion will monitor you carefully for complications. HOME CARE INSTRUCTIONS  No special instructions are needed after a transfusion. You may find your energy is better. Speak with your caregiver about any limitations on activity for underlying diseases you may have. SEEK MEDICAL CARE IF:   Your condition is not improving after your  transfusion.  You develop redness or irritation at the intravenous (IV) site. SEEK IMMEDIATE MEDICAL CARE IF:  Any of the following symptoms occur over the next 12 hours:  Shaking chills.  You have a temperature by mouth above 102 F (38.9 C), not controlled by medicine.  Chest, back, or muscle pain.  People around you feel you are not acting correctly or are confused.  Shortness of breath or difficulty breathing.  Dizziness and fainting.  You get a rash or develop hives.  You have a decrease in urine output.  Your urine turns a dark color or changes to pink, red, or brown. Any of the following symptoms occur over the next 10 days:  You have a temperature by mouth above 102 F (38.9 C), not controlled by medicine.  Shortness of breath.  Weakness after normal activity.  The white part of the eye turns yellow (jaundice).  You have a decrease in the amount of urine or are urinating less often.  Your urine turns a dark color or changes to pink, red, or brown. Document Released: 08/14/2000 Document Revised: 11/09/2011 Document Reviewed: 04/02/2008 [facility] Patient Information 2014 [place], [place].  _______________________________________________________________________

## 2020-01-09 ENCOUNTER — Ambulatory Visit: Payer: Commercial Managed Care - PPO | Admitting: Obstetrics and Gynecology

## 2020-01-09 ENCOUNTER — Encounter (HOSPITAL_COMMUNITY)
Admission: RE | Admit: 2020-01-09 | Discharge: 2020-01-09 | Disposition: A | Discharge status: Home / Self Care | Payer: Commercial Managed Care - PPO | Source: Ambulatory Visit | Attending: Obstetrics and Gynecology | Admitting: Obstetrics and Gynecology

## 2020-01-09 ENCOUNTER — Other Ambulatory Visit: Payer: Self-pay

## 2020-01-09 ENCOUNTER — Encounter (HOSPITAL_COMMUNITY): Payer: Self-pay

## 2020-01-09 DIAGNOSIS — Z01812 Encounter for preprocedural laboratory examination: Secondary | ICD-10-CM | POA: Diagnosis not present

## 2020-01-09 HISTORY — DX: Personal history of irradiation: Z92.3

## 2020-01-09 HISTORY — DX: Malignant neoplasm of unspecified site of right female breast: C50.911

## 2020-01-09 LAB — CBC WITH DIFFERENTIAL/PLATELET
Abs Immature Granulocytes: 0.02 10*3/uL (ref 0.00–0.07)
Basophils Absolute: 0 10*3/uL (ref 0.0–0.1)
Basophils Relative: 1 %
Eosinophils Absolute: 0.1 10*3/uL (ref 0.0–0.5)
Eosinophils Relative: 2 %
HCT: 37 % (ref 36.0–46.0)
Hemoglobin: 11.9 g/dL — ABNORMAL LOW (ref 12.0–15.0)
Immature Granulocytes: 0 %
Lymphocytes Relative: 26 %
Lymphs Abs: 1.3 10*3/uL (ref 0.7–4.0)
MCH: 31 pg (ref 26.0–34.0)
MCHC: 32.2 g/dL (ref 30.0–36.0)
MCV: 96.4 fL (ref 80.0–100.0)
Monocytes Absolute: 0.5 10*3/uL (ref 0.1–1.0)
Monocytes Relative: 10 %
Neutro Abs: 3.1 10*3/uL (ref 1.7–7.7)
Neutrophils Relative %: 61 %
Platelets: 201 10*3/uL (ref 150–400)
RBC: 3.84 MIL/uL — ABNORMAL LOW (ref 3.87–5.11)
RDW: 13 % (ref 11.5–15.5)
WBC: 5.1 10*3/uL (ref 4.0–10.5)
nRBC: 0 % (ref 0.0–0.2)

## 2020-01-09 LAB — BASIC METABOLIC PANEL
Anion gap: 9 (ref 5–15)
BUN: 12 mg/dL (ref 8–23)
CO2: 27 mmol/L (ref 22–32)
Calcium: 8.9 mg/dL (ref 8.9–10.3)
Chloride: 102 mmol/L (ref 98–111)
Creatinine, Ser: 0.65 mg/dL (ref 0.44–1.00)
GFR calc Af Amer: >60 mL/min (ref >60)
GFR calc non Af Amer: >60 mL/min (ref >60)
Glucose, Bld: 157 mg/dL — ABNORMAL HIGH (ref 70–99)
Potassium: 4.2 mmol/L (ref 3.5–5.1)
Sodium: 138 mmol/L (ref 135–145)

## 2020-01-09 NOTE — Progress Notes (Signed)
COVID 19 VACCINE SERIES COMPLETED  PCP - Dr. Salena Saner Cardiologist - Dr. Edmonia James not since 2016  Chest x-ray - greater than 1 year EKG - 09/27/19 in epic Stress Test - greater than 2 years ECHO - greater than 2 years Cardiac Cath - N/A  Sleep Study -  N/A CPAP -  N/A  Fasting Blood Sugar -  N/A Checks Blood Sugar __ N/A___ times a day  Blood Thinner Instructions:  N/A Aspirin Instructions:  N/A Last Dose:  N/A  Anesthesia review:  N/A  Patient denies shortness of breath, fever, cough and chest pain at PAT appointment   Patient verbalized understanding of instructions that were given to them at the PAT appointment. Patient was also instructed that they will need to review over the PAT instructions again at home before surgery.

## 2020-01-10 LAB — ABO/RH: ABO/RH(D): A POS

## 2020-01-11 ENCOUNTER — Other Ambulatory Visit: Payer: Self-pay | Admitting: *Deleted

## 2020-01-11 DIAGNOSIS — R739 Hyperglycemia, unspecified: Secondary | ICD-10-CM

## 2020-01-12 ENCOUNTER — Other Ambulatory Visit (HOSPITAL_COMMUNITY)
Admission: RE | Admit: 2020-01-12 | Discharge: 2020-01-12 | Disposition: A | Discharge status: Home / Self Care | Payer: Commercial Managed Care - PPO | Source: Ambulatory Visit | Attending: Obstetrics and Gynecology | Admitting: Obstetrics and Gynecology

## 2020-01-12 ENCOUNTER — Other Ambulatory Visit (INDEPENDENT_AMBULATORY_CARE_PROVIDER_SITE_OTHER): Payer: Commercial Managed Care - PPO

## 2020-01-12 ENCOUNTER — Other Ambulatory Visit: Payer: Self-pay

## 2020-01-12 DIAGNOSIS — Z20822 Contact with and (suspected) exposure to covid-19: Secondary | ICD-10-CM | POA: Diagnosis not present

## 2020-01-12 DIAGNOSIS — Z01812 Encounter for preprocedural laboratory examination: Secondary | ICD-10-CM | POA: Insufficient documentation

## 2020-01-12 DIAGNOSIS — R739 Hyperglycemia, unspecified: Secondary | ICD-10-CM

## 2020-01-12 LAB — SARS CORONAVIRUS 2 (TAT 6-24 HRS): SARS Coronavirus 2: NEGATIVE

## 2020-01-13 LAB — HEMOGLOBIN A1C
Est. average glucose Bld gHb Est-mCnc: 114 mg/dL
Hgb A1c MFr Bld: 5.6 % (ref 4.8–5.6)

## 2020-01-16 ENCOUNTER — Ambulatory Visit (HOSPITAL_BASED_OUTPATIENT_CLINIC_OR_DEPARTMENT_OTHER): Payer: Commercial Managed Care - PPO | Admitting: Anesthesiology

## 2020-01-16 ENCOUNTER — Other Ambulatory Visit: Payer: Self-pay

## 2020-01-16 ENCOUNTER — Observation Stay (HOSPITAL_BASED_OUTPATIENT_CLINIC_OR_DEPARTMENT_OTHER)
Admission: RE | Admit: 2020-01-16 | Discharge: 2020-01-17 | Disposition: A | Discharge status: Home / Self Care | Payer: Commercial Managed Care - PPO | Attending: Obstetrics and Gynecology | Admitting: Obstetrics and Gynecology

## 2020-01-16 ENCOUNTER — Encounter (HOSPITAL_BASED_OUTPATIENT_CLINIC_OR_DEPARTMENT_OTHER): Payer: Self-pay | Admitting: Obstetrics and Gynecology

## 2020-01-16 ENCOUNTER — Encounter (HOSPITAL_BASED_OUTPATIENT_CLINIC_OR_DEPARTMENT_OTHER)
Admission: RE | Disposition: A | Discharge status: Home / Self Care | Payer: Self-pay | Source: Home / Self Care | Attending: Obstetrics and Gynecology

## 2020-01-16 DIAGNOSIS — Z90710 Acquired absence of both cervix and uterus: Secondary | ICD-10-CM

## 2020-01-16 DIAGNOSIS — N950 Postmenopausal bleeding: Secondary | ICD-10-CM | POA: Diagnosis not present

## 2020-01-16 DIAGNOSIS — Z79899 Other long term (current) drug therapy: Secondary | ICD-10-CM | POA: Insufficient documentation

## 2020-01-16 DIAGNOSIS — D219 Benign neoplasm of connective and other soft tissue, unspecified: Secondary | ICD-10-CM | POA: Diagnosis not present

## 2020-01-16 DIAGNOSIS — Z79810 Long term (current) use of selective estrogen receptor modulators (SERMs): Secondary | ICD-10-CM | POA: Insufficient documentation

## 2020-01-16 DIAGNOSIS — I10 Essential (primary) hypertension: Secondary | ICD-10-CM | POA: Insufficient documentation

## 2020-01-16 DIAGNOSIS — N800 Endometriosis of uterus: Secondary | ICD-10-CM | POA: Insufficient documentation

## 2020-01-16 DIAGNOSIS — D649 Anemia, unspecified: Secondary | ICD-10-CM | POA: Diagnosis not present

## 2020-01-16 DIAGNOSIS — Z853 Personal history of malignant neoplasm of breast: Secondary | ICD-10-CM | POA: Insufficient documentation

## 2020-01-16 DIAGNOSIS — N83201 Unspecified ovarian cyst, right side: Secondary | ICD-10-CM | POA: Diagnosis not present

## 2020-01-16 DIAGNOSIS — D259 Leiomyoma of uterus, unspecified: Principal | ICD-10-CM | POA: Insufficient documentation

## 2020-01-16 DIAGNOSIS — Z87891 Personal history of nicotine dependence: Secondary | ICD-10-CM | POA: Insufficient documentation

## 2020-01-16 DIAGNOSIS — N736 Female pelvic peritoneal adhesions (postinfective): Secondary | ICD-10-CM | POA: Diagnosis not present

## 2020-01-16 HISTORY — PX: TOTAL LAPAROSCOPIC HYSTERECTOMY WITH BILATERAL SALPINGO OOPHORECTOMY: SHX6845

## 2020-01-16 HISTORY — PX: CYSTOSCOPY: SHX5120

## 2020-01-16 LAB — CBC
HCT: 31.8 % — ABNORMAL LOW (ref 36.0–46.0)
Hemoglobin: 10.3 g/dL — ABNORMAL LOW (ref 12.0–15.0)
MCH: 31.8 pg (ref 26.0–34.0)
MCHC: 32.4 g/dL (ref 30.0–36.0)
MCV: 98.1 fL (ref 80.0–100.0)
Platelets: 165 10*3/uL (ref 150–400)
RBC: 3.24 MIL/uL — ABNORMAL LOW (ref 3.87–5.11)
RDW: 12.9 % (ref 11.5–15.5)
WBC: 9.6 10*3/uL (ref 4.0–10.5)
nRBC: 0 % (ref 0.0–0.2)

## 2020-01-16 LAB — TYPE AND SCREEN
ABO/RH(D): A POS
Antibody Screen: NEGATIVE

## 2020-01-16 SURGERY — HYSTERECTOMY, TOTAL, LAPAROSCOPIC, WITH BILATERAL SALPINGO-OOPHORECTOMY
Anesthesia: General | Site: Bladder

## 2020-01-16 MED ORDER — FENTANYL CITRATE (PF) 100 MCG/2ML IJ SOLN
INTRAMUSCULAR | Status: AC
  Filled 2020-01-16: qty 2

## 2020-01-16 MED ORDER — ENOXAPARIN SODIUM 40 MG/0.4ML ~~LOC~~ SOLN
SUBCUTANEOUS | Status: AC
  Filled 2020-01-16: qty 0.4

## 2020-01-16 MED ORDER — MIDAZOLAM HCL 2 MG/2ML IJ SOLN
INTRAMUSCULAR | Status: AC
  Filled 2020-01-16: qty 2

## 2020-01-16 MED ORDER — FENTANYL CITRATE (PF) 100 MCG/2ML IJ SOLN
25.0000 ug | INTRAMUSCULAR | Status: DC | PRN
  Administered 2020-01-16: 25 ug via INTRAVENOUS

## 2020-01-16 MED ORDER — SODIUM CHLORIDE 0.9 % IV SOLN
2.0000 g | INTRAVENOUS | Status: AC
  Administered 2020-01-16: 2 g via INTRAVENOUS

## 2020-01-16 MED ORDER — SUGAMMADEX SODIUM 200 MG/2ML IV SOLN
INTRAVENOUS | Status: DC | PRN
  Administered 2020-01-16: 140 mg via INTRAVENOUS

## 2020-01-16 MED ORDER — MORPHINE SULFATE (PF) 4 MG/ML IV SOLN: 1.0000 mg | INTRAVENOUS | Status: DC | PRN

## 2020-01-16 MED ORDER — ONDANSETRON HCL 4 MG/2ML IJ SOLN: 4.0000 mg | Freq: Once | INTRAMUSCULAR | Status: DC | PRN

## 2020-01-16 MED ORDER — SODIUM CHLORIDE 0.9 % IR SOLN
Status: DC | PRN
  Administered 2020-01-16: 1000 mL

## 2020-01-16 MED ORDER — DEXAMETHASONE SODIUM PHOSPHATE 10 MG/ML IJ SOLN
INTRAMUSCULAR | Status: DC | PRN
  Administered 2020-01-16: 5 mg via INTRAVENOUS

## 2020-01-16 MED ORDER — PROPOFOL 10 MG/ML IV BOLUS
INTRAVENOUS | Status: AC
  Filled 2020-01-16: qty 40

## 2020-01-16 MED ORDER — DEXAMETHASONE SODIUM PHOSPHATE 10 MG/ML IJ SOLN
INTRAMUSCULAR | Status: AC
  Filled 2020-01-16: qty 1

## 2020-01-16 MED ORDER — OXYCODONE-ACETAMINOPHEN 5-325 MG PO TABS
ORAL_TABLET | ORAL | Status: AC
  Filled 2020-01-16: qty 1

## 2020-01-16 MED ORDER — FENTANYL CITRATE (PF) 250 MCG/5ML IJ SOLN
INTRAMUSCULAR | Status: AC
  Filled 2020-01-16: qty 5

## 2020-01-16 MED ORDER — SODIUM CHLORIDE 0.9 % IV SOLN
INTRAVENOUS | Status: AC
  Filled 2020-01-16: qty 2

## 2020-01-16 MED ORDER — HYDROCODONE-ACETAMINOPHEN 5-325 MG PO TABS
ORAL_TABLET | ORAL | Status: AC
  Filled 2020-01-16: qty 1

## 2020-01-16 MED ORDER — KETOROLAC TROMETHAMINE 30 MG/ML IJ SOLN
INTRAMUSCULAR | Status: AC
  Filled 2020-01-16: qty 1

## 2020-01-16 MED ORDER — PHENYLEPHRINE 40 MCG/ML (10ML) SYRINGE FOR IV PUSH (FOR BLOOD PRESSURE SUPPORT)
PREFILLED_SYRINGE | INTRAVENOUS | Status: DC | PRN
  Administered 2020-01-16: 80 ug via INTRAVENOUS
  Administered 2020-01-16 (×2): 120 ug via INTRAVENOUS
  Administered 2020-01-16: 80 ug via INTRAVENOUS

## 2020-01-16 MED ORDER — KETOROLAC TROMETHAMINE 30 MG/ML IJ SOLN
INTRAMUSCULAR | Status: DC | PRN
  Administered 2020-01-16: 30 mg via INTRAVENOUS

## 2020-01-16 MED ORDER — KETOROLAC TROMETHAMINE 15 MG/ML IJ SOLN
15.0000 mg | Freq: Four times a day (QID) | INTRAMUSCULAR | Status: DC
  Administered 2020-01-16 – 2020-01-17 (×3): 15 mg via INTRAVENOUS

## 2020-01-16 MED ORDER — ONDANSETRON HCL 4 MG/2ML IJ SOLN
INTRAMUSCULAR | Status: AC
  Filled 2020-01-16: qty 2

## 2020-01-16 MED ORDER — ROCURONIUM BROMIDE 10 MG/ML (PF) SYRINGE
PREFILLED_SYRINGE | INTRAVENOUS | Status: DC | PRN
  Administered 2020-01-16: 30 mg via INTRAVENOUS
  Administered 2020-01-16: 50 mg via INTRAVENOUS
  Administered 2020-01-16: 20 mg via INTRAVENOUS

## 2020-01-16 MED ORDER — LACTATED RINGERS IV SOLN: INTRAVENOUS | Status: DC

## 2020-01-16 MED ORDER — ROCURONIUM BROMIDE 10 MG/ML (PF) SYRINGE
PREFILLED_SYRINGE | INTRAVENOUS | Status: AC
  Filled 2020-01-16: qty 10

## 2020-01-16 MED ORDER — ONDANSETRON HCL 4 MG/2ML IJ SOLN: 4.0000 mg | Freq: Four times a day (QID) | INTRAMUSCULAR | Status: DC | PRN

## 2020-01-16 MED ORDER — LIDOCAINE HCL (CARDIAC) PF 100 MG/5ML IV SOSY
PREFILLED_SYRINGE | INTRAVENOUS | Status: DC | PRN
  Administered 2020-01-16: 60 mg via INTRAVENOUS

## 2020-01-16 MED ORDER — PROPOFOL 10 MG/ML IV BOLUS
INTRAVENOUS | Status: DC | PRN
  Administered 2020-01-16: 130 mg via INTRAVENOUS

## 2020-01-16 MED ORDER — ENOXAPARIN SODIUM 40 MG/0.4ML ~~LOC~~ SOLN
40.0000 mg | SUBCUTANEOUS | Status: AC
  Administered 2020-01-16: 40 mg via SUBCUTANEOUS

## 2020-01-16 MED ORDER — ONDANSETRON HCL 4 MG/2ML IJ SOLN
INTRAMUSCULAR | Status: DC | PRN
  Administered 2020-01-16: 4 mg via INTRAVENOUS

## 2020-01-16 MED ORDER — MENTHOL 3 MG MT LOZG: 1.0000 | LOZENGE | OROMUCOSAL | Status: DC | PRN

## 2020-01-16 MED ORDER — ONDANSETRON HCL 4 MG PO TABS: 4.0000 mg | ORAL_TABLET | Freq: Four times a day (QID) | ORAL | Status: DC | PRN

## 2020-01-16 MED ORDER — SODIUM CHLORIDE 0.9 % IV SOLN
INTRAVENOUS | Status: DC | PRN
  Administered 2020-01-16: 60 mL

## 2020-01-16 MED ORDER — PHENYLEPHRINE 40 MCG/ML (10ML) SYRINGE FOR IV PUSH (FOR BLOOD PRESSURE SUPPORT)
PREFILLED_SYRINGE | INTRAVENOUS | Status: AC
  Filled 2020-01-16: qty 10

## 2020-01-16 MED ORDER — OXYCODONE HCL 5 MG/5ML PO SOLN: 5.0000 mg | Freq: Once | ORAL | Status: DC | PRN

## 2020-01-16 MED ORDER — BUPIVACAINE HCL (PF) 0.25 % IJ SOLN
INTRAMUSCULAR | Status: DC | PRN
  Administered 2020-01-16: 6 mL

## 2020-01-16 MED ORDER — KETOROLAC TROMETHAMINE 15 MG/ML IJ SOLN
INTRAMUSCULAR | Status: AC
  Filled 2020-01-16: qty 1

## 2020-01-16 MED ORDER — FENTANYL CITRATE (PF) 250 MCG/5ML IJ SOLN
INTRAMUSCULAR | Status: DC | PRN
  Administered 2020-01-16: 25 ug via INTRAVENOUS
  Administered 2020-01-16 (×2): 50 ug via INTRAVENOUS
  Administered 2020-01-16: 25 ug via INTRAVENOUS
  Administered 2020-01-16: 100 ug via INTRAVENOUS
  Administered 2020-01-16: 50 ug via INTRAVENOUS

## 2020-01-16 MED ORDER — MIDAZOLAM HCL 2 MG/2ML IJ SOLN
INTRAMUSCULAR | Status: DC | PRN
  Administered 2020-01-16: 2 mg via INTRAVENOUS

## 2020-01-16 MED ORDER — LIDOCAINE 2% (20 MG/ML) 5 ML SYRINGE
INTRAMUSCULAR | Status: AC
  Filled 2020-01-16: qty 5

## 2020-01-16 MED ORDER — OXYCODONE-ACETAMINOPHEN 5-325 MG PO TABS
1.0000 | ORAL_TABLET | ORAL | Status: DC | PRN
  Administered 2020-01-16 (×4): 1 via ORAL
  Administered 2020-01-17 (×2): 2 via ORAL

## 2020-01-16 MED ORDER — EPHEDRINE SULFATE-NACL 50-0.9 MG/10ML-% IV SOSY
PREFILLED_SYRINGE | INTRAVENOUS | Status: DC | PRN
  Administered 2020-01-16 (×2): 10 mg via INTRAVENOUS
  Administered 2020-01-16 (×3): 5 mg via INTRAVENOUS

## 2020-01-16 MED ORDER — SODIUM CHLORIDE 0.9 % IR SOLN
Status: DC | PRN
  Administered 2020-01-16: 1000 mL via INTRAVESICAL

## 2020-01-16 MED ORDER — OXYCODONE HCL 5 MG PO TABS: 5.0000 mg | ORAL_TABLET | Freq: Once | ORAL | Status: DC | PRN

## 2020-01-16 MED ORDER — EPHEDRINE 5 MG/ML INJ
INTRAVENOUS | Status: AC
  Filled 2020-01-16: qty 10

## 2020-01-16 SURGICAL SUPPLY — 68 items
APPLICATOR ARISTA FLEXITIP XL (MISCELLANEOUS) ×3 IMPLANT
BARRIER ADHS 3X4 INTERCEED (GAUZE/BANDAGES/DRESSINGS) IMPLANT
BLADE SURG 10 STRL SS (BLADE) ×12 IMPLANT
CABLE HIGH FREQUENCY MONO STRZ (ELECTRODE) IMPLANT
CANISTER SUCT 3000ML PPV (MISCELLANEOUS) ×3 IMPLANT
CELL SAVER LIPIGURD (MISCELLANEOUS) ×2 IMPLANT
COVER BACK TABLE 60X90IN (DRAPES) ×3 IMPLANT
COVER MAYO STAND STRL (DRAPES) ×3 IMPLANT
COVER SURGICAL LIGHT HANDLE (MISCELLANEOUS) ×3 IMPLANT
COVER WAND RF STERILE (DRAPES) ×3 IMPLANT
DECANTER SPIKE VIAL GLASS SM (MISCELLANEOUS) IMPLANT
DERMABOND ADVANCED (GAUZE/BANDAGES/DRESSINGS)
DERMABOND ADVANCED .7 DNX12 (GAUZE/BANDAGES/DRESSINGS) IMPLANT
DRSG OPSITE POSTOP 3X4 (GAUZE/BANDAGES/DRESSINGS) ×6 IMPLANT
DURAPREP 26ML APPLICATOR (WOUND CARE) ×3 IMPLANT
EXTRT SYSTEM ALEXIS 14CM (MISCELLANEOUS) ×3
EXTRT SYSTEM ALEXIS 17CM (MISCELLANEOUS)
GAUZE 4X4 16PLY RFD (DISPOSABLE) ×3 IMPLANT
GLOVE BIO SURGEON STRL SZ 6.5 (GLOVE) ×6 IMPLANT
GLOVE BIOGEL PI IND STRL 7.0 (GLOVE) ×6 IMPLANT
GLOVE BIOGEL PI INDICATOR 7.0 (GLOVE) ×3
GOWN STRL REUS W/TWL LRG LVL3 (GOWN DISPOSABLE) ×12 IMPLANT
HEMOSTAT ARISTA ABSORB 3G PWDR (HEMOSTASIS) ×3 IMPLANT
HOLDER FOLEY CATH W/STRAP (MISCELLANEOUS) ×3 IMPLANT
IV NS 1000ML (IV SOLUTION) ×3
IV NS 1000ML BAXH (IV SOLUTION) ×2 IMPLANT
IV NS IRRIG 3000ML ARTHROMATIC (IV SOLUTION) ×3 IMPLANT
LEGGING LITHOTOMY PAIR STRL (DRAPES) ×3 IMPLANT
LIGASURE VESSEL 5MM BLUNT TIP (ELECTROSURGICAL) ×3 IMPLANT
NEEDLE INSUFFLATION 120MM (ENDOMECHANICALS) ×3 IMPLANT
NS IRRIG 500ML POUR BTL (IV SOLUTION) ×3 IMPLANT
OCCLUDER COLPOPNEUMO (BALLOONS) ×3 IMPLANT
PACK LAPAROSCOPY BASIN (CUSTOM PROCEDURE TRAY) ×3 IMPLANT
PACK TRENDGUARD 450 HYBRID PRO (MISCELLANEOUS) ×2 IMPLANT
POUCH LAPAROSCOPIC INSTRUMENT (MISCELLANEOUS) ×3 IMPLANT
PROTECTOR NERVE ULNAR (MISCELLANEOUS) ×6 IMPLANT
RETRACTOR WOUND ALXS 19CM XSML (INSTRUMENTS) IMPLANT
RTRCTR WOUND ALEXIS 19CM XSML (INSTRUMENTS)
SCISSORS LAP 5X35 DISP (ENDOMECHANICALS) IMPLANT
SET IRRIG Y TYPE TUR BLADDER L (SET/KITS/TRAYS/PACK) ×3 IMPLANT
SET SUCTION IRRIG HYDROSURG (IRRIGATION / IRRIGATOR) ×3 IMPLANT
SET TRI-LUMEN FLTR TB AIRSEAL (TUBING) ×3 IMPLANT
SHEARS HARMONIC ACE PLUS 36CM (ENDOMECHANICALS) ×3 IMPLANT
SLEEVE ADV FIXATION 5X100MM (TROCAR) ×3 IMPLANT
STRIP CLOSURE SKIN 1/2X4 (GAUZE/BANDAGES/DRESSINGS) IMPLANT
STRIP CLOSURE SKIN 1/4X4 (GAUZE/BANDAGES/DRESSINGS) ×3 IMPLANT
SUT VIC AB 0 CT1 27 (SUTURE) ×6
SUT VIC AB 0 CT1 27XBRD ANBCTR (SUTURE) ×4 IMPLANT
SUT VICRYL 0 UR6 27IN ABS (SUTURE) ×3 IMPLANT
SUT VICRYL 4-0 PS2 18IN ABS (SUTURE) ×6 IMPLANT
SUT VLOC 180 0 9IN  GS21 (SUTURE) ×3
SUT VLOC 180 0 9IN GS21 (SUTURE) ×2 IMPLANT
SYR 10ML LL (SYRINGE) ×6 IMPLANT
SYR 50ML LL SCALE MARK (SYRINGE) ×6 IMPLANT
SYSTEM CARTER THOMASON II (TROCAR) ×3 IMPLANT
SYSTEM CONTND EXTRCTN KII BLLN (MISCELLANEOUS) IMPLANT
TIP RUMI ORANGE 6.7MMX12CM (TIP) IMPLANT
TIP UTERINE 5.1X6CM LAV DISP (MISCELLANEOUS) IMPLANT
TIP UTERINE 6.7X10CM GRN DISP (MISCELLANEOUS) ×3 IMPLANT
TIP UTERINE 6.7X6CM WHT DISP (MISCELLANEOUS) IMPLANT
TIP UTERINE 6.7X8CM BLUE DISP (MISCELLANEOUS) IMPLANT
TOWEL OR 17X26 10 PK STRL BLUE (TOWEL DISPOSABLE) ×3 IMPLANT
TRAY FOLEY W/BAG SLVR 14FR (SET/KITS/TRAYS/PACK) ×6 IMPLANT
TRENDGUARD 450 HYBRID PRO PACK (MISCELLANEOUS) ×3
TROCAR ADV FIXATION 5X100MM (TROCAR) ×3 IMPLANT
TROCAR BLADELESS OPT 5 100 (ENDOMECHANICALS) ×3 IMPLANT
TROCAR PORT AIRSEAL 8X120 (TROCAR) ×3 IMPLANT
WARMER LAPAROSCOPE (MISCELLANEOUS) ×3 IMPLANT

## 2020-01-16 NOTE — Anesthesia Preprocedure Evaluation (Signed)
Anesthesia Evaluation  Patient identified by MRN, date of birth, ID band Patient awake    Reviewed: Allergy & Precautions, NPO status , Patient's Chart, lab work & pertinent test results  History of Anesthesia Complications (+) PONVNegative for: history of anesthetic complications  Airway Mallampati: II  TM Distance: >3 FB Neck ROM: Full    Dental  (+) Teeth Intact   Pulmonary neg pulmonary ROS, former smoker,    Pulmonary exam normal        Cardiovascular hypertension, Pt. on medications Normal cardiovascular exam     Neuro/Psych negative neurological ROS  negative psych ROS   GI/Hepatic negative GI ROS, Neg liver ROS,   Endo/Other  negative endocrine ROS  Renal/GU negative Renal ROS  negative genitourinary   Musculoskeletal negative musculoskeletal ROS (+)   Abdominal   Peds  Hematology  (+) anemia , Hgb 11.9   Anesthesia Other Findings  Breast cancer s/p lumpectomy, XRT, tamoxifen  Echo 2016: EF 55%, mild MR  Reproductive/Obstetrics Fibroids, post menopausal bleeding                            Anesthesia Physical Anesthesia Plan  ASA: III  Anesthesia Plan: General   Post-op Pain Management:    Induction: Intravenous  PONV Risk Score and Plan: 4 or greater and Ondansetron, Dexamethasone, Treatment may vary due to age or medical condition and Midazolam  Airway Management Planned: Oral ETT  Additional Equipment: None  Intra-op Plan:   Post-operative Plan: Extubation in OR  Informed Consent: I have reviewed the patients History and Physical, chart, labs and discussed the procedure including the risks, benefits and alternatives for the proposed anesthesia with the patient or authorized representative who has indicated his/her understanding and acceptance.     Dental advisory given  Plan Discussed with:   Anesthesia Plan Comments:         Anesthesia Quick  Evaluation

## 2020-01-16 NOTE — Transfer of Care (Signed)
Immediate Anesthesia Transfer of Care Note  Patient: [name]  Procedure(s) Performed: TOTAL LAPAROSCOPIC HYSTERECTOMY WITH BILATERAL SALPINGO OOPHORECTOMY/COLLECTION OF PELVIC WASHINGS, LYSIS OF ADHESIONS (N/A Abdomen) CYSTOSCOPY (N/A Bladder)  Patient Location: PACU  Anesthesia Type:General  Level of Consciousness: awake, alert , oriented, drowsy and patient cooperative  Airway & Oxygen Therapy: Patient Spontanous Breathing and Patient connected to face mask oxygen  Post-op Assessment: Report given to RN and Post -op Vital signs reviewed and stable  Post vital signs: Reviewed and stable  Last Vitals:  Vitals Value Taken Time  BP 96/57 01/16/20 1027  Temp    Pulse 78 01/16/20 1030  Resp 13 01/16/20 1030  SpO2 100 % 01/16/20 1030  Vitals shown include unvalidated device data.  Last Pain:  Vitals:   01/16/20 0539  TempSrc: Oral         Complications: No apparent anesthesia complications

## 2020-01-16 NOTE — Progress Notes (Signed)
Upon checking post-op vital signs at 30 minutes and 1 hour, patient's HR noted to initially be in the low 40s, dropping as low as 37 BPM. HR would then increase slowly from 40s to 50s to 60s to 70s with a maximum of 80. B/P remains 102/58. Radial pulse palpated and found to be consistent with low HR in the 40s, rate irregular. Patient denies palpitations, SOB, dyspnea. Patient has no noted cardiac history. Dr. Christella Hartigan notified, no new orders obtained. Will continue to monitor.

## 2020-01-16 NOTE — Anesthesia Postprocedure Evaluation (Signed)
Anesthesia Post Note  Patient: Theresa [name]  Procedure(s) Performed: TOTAL LAPAROSCOPIC HYSTERECTOMY WITH BILATERAL SALPINGO OOPHORECTOMY/COLLECTION OF PELVIC WASHINGS, LYSIS OF ADHESIONS (N/A Abdomen) CYSTOSCOPY (N/A Bladder)     Patient location during evaluation: PACU Anesthesia Type: General Level of consciousness: awake and alert Pain management: pain level controlled Vital Signs Assessment: post-procedure vital signs reviewed and stable Respiratory status: spontaneous breathing, nonlabored ventilation and respiratory function stable Cardiovascular status: blood pressure returned to baseline and stable Postop Assessment: no apparent nausea or vomiting Anesthetic complications: no    Last Vitals:  Vitals:   01/16/20 1130 01/16/20 1145  BP: 108/66 (!) 106/57  Pulse: 79 83  Resp: 18 16  Temp:  36.9 C  SpO2: 98% 95%    Last Pain:  Vitals:   01/16/20 1145  TempSrc:   PainSc: 5                  Lidia Collum

## 2020-01-16 NOTE — Progress Notes (Signed)
Day of Surgery Procedure(s) (LRB): TOTAL LAPAROSCOPIC HYSTERECTOMY WITH BILATERAL SALPINGO OOPHORECTOMY/COLLECTION OF PELVIC WASHINGS, LYSIS OF ADHESIONS (N/A) CYSTOSCOPY (N/A)  Subjective: Patient reports incisional pain, tolerating PO, + flatus and no problems voiding.   Right lower quadrant incision is sore.   Patient had bigeminy noted in PACU.  Anesthesia Ok'd patient to be in routine post op recovery setting and not in telemetry. She denies palpitations, shortness of breath or chest pain.   Objective: I have reviewed patient's vital signs, intake and output and labs. Vitals:   01/16/20 1450 01/16/20 1700  BP:  (!) 104/52  Pulse: 83 80  Resp:  16  Temp:  97.7 F (36.5 C)  SpO2:  98%   CBC    Component Value Date/Time   WBC 9.6 01/16/2020 1551   RBC 3.24 (L) 01/16/2020 1551   HGB 10.3 (L) 01/16/2020 1551   HGB 12.0 10/26/2019 1245   HGB 12.8 03/15/2013 1434   HCT 31.8 (L) 01/16/2020 1551   PLT 165 01/16/2020 1551   PLT 219 10/26/2019 1245   MCV 98.1 01/16/2020 1551   MCH 31.8 01/16/2020 1551   MCHC 32.4 01/16/2020 1551   RDW 12.9 01/16/2020 1551   LYMPHSABS 1.3 01/09/2020 1345   MONOABS 0.5 01/09/2020 1345   EOSABS 0.1 01/09/2020 1345   BASOSABS 0.0 01/09/2020 1345   I/O - 2500 cc/1125 cc General: alert and cooperative Resp: clear to auscultation bilaterally Cardio: regular rate and rhythm, S1, S2 normal, no murmur, click, rub or gallop GI: soft, non-tender; bowel sounds normal; no masses,  no organomegaly and incision: clean, dry and intact Vaginal Bleeding: minimal  Ext:  PAS and Ted hose on.  Assessment: s/p Procedure(s) with comments: TOTAL LAPAROSCOPIC HYSTERECTOMY WITH BILATERAL SALPINGO OOPHORECTOMY/COLLECTION OF PELVIC WASHINGS, LYSIS OF ADHESIONS (N/A) - collection of pelvic washings CYSTOSCOPY (N/A): progressing well  Plan: Overnnight observation.  Will check CBC and BMP in am.  Percocet and Motrin for pain.   Has Morphine and Toradol for  breakthrough pain.  Surgical findings and procedure discussed.    LOS: 0 days    Arloa Koh 01/16/2020, 6:13 PM

## 2020-01-16 NOTE — H&P (Signed)
Office Visit  12/25/2019 Murphy Silva, Everardo All, MD Obstetrics and Gynecology  Postmenopausal bleeding +2 more Dx  Surgical Consult ; Referred by [name], MD Reason for Visit  Additional Documentation  Vitals:    BP 118/82  Pulse 70   Temp 97 F (36.1 C)  (Temporal)  Ht 5\' 4"  (1.626 m)  Wt 66 kg  LMP 09/01/2007 (Exact Date)  BMI 24.96 kg/m  BSA 1.73 m    More Vitals  Flowsheets:    MEWS Score,  Anthropometrics,  NEWS,  Method of Visit    Encounter Info:    Billing Info,  History,  Allergies,  Detailed Report    All Notes   Progress Notes by Burnice Logan, RN at 12/25/2019 1:00 PM Author: Burnice Logan, RN Author Type: Registered Nurse Filed: 12/28/2019  9:38 PM  Note Status: Signed Cosign: Cosign Not Required Encounter Date: 12/25/2019  Editor: Burnice Logan, RN (Registered Nurse)    Patient scheduled while on office for PUS with EMB on 12/28/19 at [facility], consult to follow with Dr. Quincy Simmonds. Advised patient to take Motrin 800 mg with food and water one hour before procedure. Order placed for precert. Patient verbalizes understanding and is agreeable.      Progress Notes by Nunzio Cobbs, MD at 12/25/2019 1:00 PM Author: Nunzio Cobbs, MD Author Type: Physician Filed: 12/28/2019  9:38 PM  Note Status: Signed Cosign: Cosign Not Required Encounter Date: 12/25/2019  Editor: Nunzio Cobbs, MD (Physician)  Prior Versions: 1. Lowella Fairy, CMA (Certified Medical Assistant) at 12/25/2019  1:01 PM - Sign when Signing Visit    GYNECOLOGY  VISIT   HPI: 64 y.o.   Married  Caucasian  female   G1P1001 with Patient's last menstrual period was 09/01/2007 (exact date).   here for surgical consult for recurrent postmenopausal bleeding, uterine fibroids, and a right ovarian cyst.    Patient did have vaginal bleeding which was brown blood 2 days ago.    On her pelvic US on 07/06/19, she has  mutliple fibroids, the largest being 49 mm. Her EMS was 2.9 mm. She has a 27 x 23 mm calcified right ovarian cyst noted  Her left ovary was normal. Saline US showed no filling defects.   CA125 was 7.8 on 07/06/19.   Her EMB showed benign atrophy on 07/06/19.    She was scheduled for hysterectomy surgery but this was cancelled due to need to complete care first for right breast cancer.   She had lumpectomy with positive margins and positive lymph nodes. She was started on Tamoxifen in November, 2020.  She then had re-excision of the positive margin and is now finishing radiation therapy in 9 days.    She had negative genetic testing.    She is taking Effexor for menopausal vasomotor symptoms.    She had a bladder sling for urinary incontinence by Dr. Terance Hart at [facility] in early 2000.    She completed her Covid vaccination.    GYNECOLOGIC HISTORY: Patient's last menstrual period was 09/01/2007 (exact date). Contraception:  Tubal Menopausal hormone therapy:  none Last mammogram: 08-07-19 MRI --see Epic Last pap smear: 06-23-19 Neg:Neg HR HPV,06-09-17 Neg:Neg HR HPV,05-02-15 Neg:Neg HR Neg                OB History     Gravida  1   Para  1   Term  1  Preterm      AB      Living  1      SAB      TAB      Ectopic      Multiple      Live Births                        Patient Active Problem List    Diagnosis Date Noted  . Genetic testing 08/04/2019  . Family history of uterine cancer    . Family history of stomach cancer    . Family history of bone cancer    . Family history of brain cancer    . Postmenopausal bleeding 07/09/2019  . Right ovarian cyst 07/09/2019  . Malignant neoplasm of lower-inner quadrant of right breast of female, estrogen receptor positive ([place]) 07/06/2019  . History of Clostridioides difficile infection 06/17/2018  . Fibroids 08/06/2017  . Breast mass in female-left at 12:00-benign 08/29/2012          Past Medical  History:  Diagnosis Date  . Contact lens/glasses fitting      wears contacts or glasses  . Diarrhea      --post gallbladder surgery  . Family history of bone cancer    . Family history of brain cancer    . Family history of stomach cancer    . Family history of uterine cancer    . Fibroids    . Hypertension    . No pertinent past medical history    . PONV (postoperative nausea and vomiting)    . Right ovarian cyst    . Vertigo             Past Surgical History:  Procedure Laterality Date  . BLADDER SUSPENSION      . BREAST BIOPSY   09/08/2012    Procedure: BREAST BIOPSY;  Surgeon: Odis Hollingshead, MD;  Location: [place];  Service: General;  Laterality: Left;  remove left breast mass  . BREAST LUMPECTOMY WITH RADIOACTIVE SEED AND SENTINEL LYMPH NODE BIOPSY Right 10/04/2019    Procedure: RIGHT BREAST LUMPECTOMY WITH BRACKETED RADIOACTIVE SEEDS, RIGHT BREAST RADIOACTIVE SEED GUIDED EXCISION BIOPSY, AND RIGHT SENTINEL LYMPH NODE BIOPSY;  Surgeon: Stark Klein, MD;  Location: [place];  Service: General;  Laterality: Right;  . BREAST SURGERY        lumpectomy x2  . BUNIONECTOMY Right 01/2016  . CHOLECYSTECTOMY      . COLONOSCOPY      . MASTOPEXY Right 10/04/2019    Procedure: RIGHT BREAST MASTOPEXY;  Surgeon: Stark Klein, MD;  Location: [place];  Service: General;  Laterality: Right;  . RE-EXCISION OF BREAST LUMPECTOMY Right 10/31/2019    Procedure: RIGHT RE-EXCISION OF BREAST LUMPECTOMY;  Surgeon: Stark Klein, MD;  Location: [place];  Service: General;  Laterality: Right;  . TUBAL LIGATION                Current Outpatient Medications  Medication Sig Dispense Refill  . CRANBERRY PO Take by mouth.      . hydrocortisone 2.5 % cream Apply topically 3 (three) times daily as needed. 28 g 3  . Lactobacillus Rhamnosus, GG, (CULTURELLE PO) Take by mouth.      . loperamide (IMODIUM) 2 MG capsule Take 2 mg by mouth  daily.      . Magnesium 100 MG TABS Take by mouth.      . Melatonin  10 MG CAPS Take by mouth.      . mupirocin ointment (BACTROBAN) 2 % APPLY TO AFFECTED AREA EVERY DAY      . nystatin ointment (MYCOSTATIN) Apply 1 application topically 2 (two) times daily. Apply to affected area for up to 7 days. 30 g 0  . OVER THE COUNTER MEDICATION Vitamin C, B complex & quercetin      . Pumpkin Seed-Soy Germ (AZO BLADDER CONTROL/GO-LESS PO) Take by mouth.      . tamoxifen (NOLVADEX) 20 MG tablet Take 20 mg by mouth daily.      Marland Kitchen triamcinolone (KENALOG) 0.025 % ointment Apply 1 application topically 2 (two) times daily. Use for one week. 30 g 0  . UNABLE TO FIND Turmeric XL Liquid: takes daily      . valsartan (DIOVAN) 80 MG tablet Take 80 mg by mouth at bedtime.      Marland Kitchen venlafaxine XR (EFFEXOR-XR) 37.5 MG 24 hr capsule Take 37.5 mg by mouth daily.      Marland Kitchen VITAMIN D PO Take by mouth.        No current facility-administered medications for this visit.      ALLERGIES: Flagyl [metronidazole] and Other        Family History  Problem Relation Age of Onset  . Hypertension Father    . Stomach cancer Father          may have been colon, diagnosed in his 44s  . Depression Mother    . Dementia Mother    . Thyroid disease Sister    . Heart disease Sister    . Brain cancer Maternal Aunt 80  . Cancer Maternal Grandmother          undetermined type, diagnosed in her 47s  . Bone cancer Paternal Grandfather 42  . Uterine cancer Paternal Aunt 71  . Uterine cancer Cousin          paternal 1st cousin, diagnosed in her late 63s  . Uterine cancer Cousin 14        paternal 1st cousin      Social History         Socioeconomic History  . Marital status: Married      Spouse name: Daryl   . Number of children: Not on file  . Years of education: Not on file  . Highest education level: Not on file  Occupational History  . Not on file  Tobacco Use  . Smoking status: Former Smoker      Types: Cigarettes       Quit date: 03/15/1996      Years since quitting: 23.7  . Smokeless tobacco: Never Used  Substance and Sexual Activity  . Alcohol use: No      Alcohol/week: 0.0 standard drinks  . Drug use: No  . Sexual activity: Not Currently      Partners: Male      Birth control/protection: Post-menopausal, Surgical      Comment: BTL  Other Topics Concern  . Not on file  Social History Narrative  . Not on file    Social Determinants of Health       Financial Resource Strain:   . Difficulty of Paying Living Expenses:   Food Insecurity:   . Worried About Charity fundraiser in the Last Year:   . Arboriculturist in the Last Year:   Transportation Needs:   . Film/video editor (Medical):   Marland Kitchen Lack of Transportation (Non-Medical):  Physical Activity:   . Days of Exercise per Week:   . Minutes of Exercise per Session:   Stress:   . Feeling of Stress :   Social Connections:   . Frequency of Communication with Friends and Family:   . Frequency of Social Gatherings with Friends and Family:   . Attends Religious Services:   . Active Member of Clubs or Organizations:   . Attends Archivist Meetings:   Marland Kitchen Marital Status:   Intimate Partner Violence:   . Fear of Current or Ex-Partner:   . Emotionally Abused:   Marland Kitchen Physically Abused:   . Sexually Abused:       Review of Systems  All other systems reviewed and are negative.     PHYSICAL EXAMINATION:     BP 118/82   Pulse 70 Comment: irregular  Temp (!) 97 F (36.1 C) (Temporal)   Ht 5\' 4"  (1.626 m)   Wt 145 lb 6.4 oz (66 kg)   LMP 09/01/2007 (Exact Date)   BMI 24.96 kg/m     General appearance: alert, cooperative and appears stated age Head: Normocephalic, without obvious abnormality, atraumatic Neck: no adenopathy, supple, symmetrical, trachea midline and thyroid normal to inspection and palpation Lungs: clear to auscultation bilaterally Heart: regular rate and rhythm Abdomen: soft, non-tender, no masses,  no  organomegaly Extremities: extremities normal, atraumatic, no cyanosis or edema Skin: Skin color, texture, turgor normal. No rashes or lesions. No abnormal inguinal nodes palpated Neurologic: Grossly normal   Pelvic: External genitalia:  no lesions              Urethra:  normal appearing urethra with no masses, tenderness or lesions              Bartholins and Skenes: normal                 Vagina: normal appearing vagina with normal color and discharge, no lesions              Cervix: no lesions                Bimanual Exam:  Uterus:  9 week size, nontenderr.              Adnexa: no mass, fullness, tenderness         Chaperone was present for exam.   ASSESSMENT   Hx recurrent postmenopausal bleeding.  Uterine fibroids.  Benign endometrial biopsy.  Right ovarian cyst.  Normal CA125.  Status post lumpectomy of right breast x 2 with positive nodes.  On Tamoxifen.  Hx bladder suspension.    PLAN   Return for pelvic US and EMB due to recurrence of bleeding, now on Tamoxifen. Will expect to proceed with total laparoscopic hysterectomy with bilateral salpingo-oophorectomy, collection of pelvic washings. We discussed vaginal morcellation in an Alexis bag. Risks, benefits, and alternatives reviewed with the patient who wishes to proceed.  Surgical expectations and recovery discussed.    An After Visit Summary was printed and given to the patient.           Addendum 01/16/20:  Follow up pelvic US on 12/28/19 showed unchanged fibroids, EMS 5.24 mm and unchanged calcified right ovarian cyst.   EMB the same day showed benign atrophic endometrium.

## 2020-01-16 NOTE — Progress Notes (Signed)
01/16/2020 1215 Dr. Quincy Simmonds paged and updated on findings of ventricular bigeminy in PACU. MD to defer to anesthesia recommendations. Pt. Updated on plan of care. Will continue to closely monitor patient.  Theresa [name], Arville Lime

## 2020-01-16 NOTE — Anesthesia Procedure Notes (Signed)
Procedure Name: Intubation Date/Time: 01/16/2020 7:38 AM Performed by: Raenette Rover, CRNA Pre-anesthesia Checklist: Patient identified, Emergency Drugs available, Suction available and Patient being monitored Patient Re-evaluated:Patient Re-evaluated prior to induction Oxygen Delivery Method: Circle system utilized Preoxygenation: Pre-oxygenation with 100% oxygen Induction Type: IV induction Ventilation: Mask ventilation without difficulty Laryngoscope Size: Mac and 3 Grade View: Grade I Tube type: Oral Tube size: 7.0 mm Number of attempts: 1 Airway Equipment and Method: Stylet Placement Confirmation: ETT inserted through vocal cords under direct vision,  positive ETCO2 and breath sounds checked- equal and bilateral Secured at: 22 cm Tube secured with: Tape Dental Injury: Teeth and Oropharynx as per pre-operative assessment

## 2020-01-16 NOTE — Op Note (Signed)
OPERATIVE REPORT   PREOPERATIVE DIAGNOSIS:    Recurrent postmenopausal bleeding,  fibroids, Tamoxifen use, right ovarian cyst.    POSTOPERATIVE DIAGNOSIS:    Recurrent postmenopausal bleeding,  fibroids, Tamoxifen use, right ovarian cyst.   PROCEDURES:  Total laparoscopic hysterectomy with bilateral salpingo-oophorectomy, lysis of adhesion, collection of pelvic washings, vaginal morcellation of specimen in an Alexis bag, and cystoscopy   SURGEON:  Lenard Galloway, M.D.   ASSISTANT:    Lyman Speller, M.D.   ANESTHESIA:  General endotracheal, intraperitoneal ropivicaine 30 mL diluted in 30 mL of normal saline, local with 0.25% Marcaine.   IVF: 1100 cc LR.   ESTIMATED BLOOD LOSS:  50  cc.   URINE OUTPUT:  300  cc.   COMPLICATIONS:  None.   INDICATIONS FOR THE PROCEDURE:      The patient is a 64 year old G67P1 Caucasian female, who presents with recurrent postmenopausal bleeding.  Pelvic ultrasound revealed uterine fibroids and a small calcified cyst of her right ovary.  Her Ca125 was normal as was her endometrial biopsy.  The patient planned for hysterectomy and was then diagnosed with right breast cancer for which she underwent treatment with lumpectomy and radiation therapy.  She started on Tamoxifen therapy as well.  She had additional vaginal bleeding once starting the Tamoxifen, and a repeat ultrasound of the pelvis was unchanged.  Her repeat endometrial biopsy showed benign atrophy.  A plan is made to proceed with a total laparoscopic hysterectomy with bilateral salpingo-oophorectomy, collection of pelvic washings, and cystoscopy after risks, benefits, and alternatives are reviewed.   FINDINGS:      Exam under anesthesia reveals a 10 week size irregular uterus.  No adnexal masses are noted.   Laparoscopy revealed an enlarged uterus with multiple fibroids.  The tubes were consistent with prior tubal ligation with Filschie clips.  Her ovaries looked unremarkable.  There was a  small adhesion of the left cul de sac peritoneum to the left lower uterine segment.  The upper abdomen demonstrated a normal liver.  The gall bladder was absent. The appendix was normal and short in length.     Cystoscopy at the termination of the procedure showed the bladder to be normal throughout 360 degrees including the bladder dome and trigone. There was no evidence of any foreign body in the bladder or the urethra.   Both of the ureters were noted to be patent bilaterally.   SPECIMENS:      The uterus, cervix, and bilateral tubes were sent to pathology separately from the pelvic washings.  The specimen weighed 281 grams.    DESCRIPTION OF PROCEDURE:     The patient was reidentified in the preoperative hold area.   She did receive Cefotetan for antibiotic prophylaxis.  She received Lovenox, TED hose, and PAS stockings for DVT prophylaxis.   In the operating room, the patient was placed in the dorsal lithotomy position on the operating room table.   Her legs were placed in the [place] stirrups and her arms were both tucked at her sides. The Trendguard was used to support her shoulders.  The patient received general endotracheal anesthesia. The abdomen and vagina were then sterilely prepped and she was sterilely draped.   A speculum was placed in the vagina and a single-tooth tenaculum was placed on the anterior cervical lip.  A figure-of-eight suture of 0 Vicryl was placed on each the anterior and the posterior cervical lips. The uterus was sounded to 9 cm.  The cervix was  then dilated with [facility] dilators.  A #10 RUMI tip with a KOH ring was then placed through the cervix and into the uterine cavity without difficulty.  The remaining vaginal instruments were then removed.  A Foley catheter was placed inside the bladder.   Attention was turned to the abdomen where the umbilical region was injected with 0.25% Marcaine and a small incision created.  A Veress needle was then used to insufflate the  abdomen with CO2 gas after a saline drop test was performed and the fluid flowed freely.  A 5 mm umbilical incision was created with a scalpel after the skin.  A 5 mm camera port was then placed using the Optiview.  5 mm incisions were then created in the left mid abdomen and the left lower abdomen after the skin was injected locally with 0.25% Marcaine.  The 5 mm trocars were then placed under visualization of the laparoscope.  An 8 mm Air Seal trocar was placed in the right lower quadrant after injecting with Marcaine and incising with a scalpel.  Pelvic washing were collected and sent to pathology.    Ropivicine 30 cc diluted in 30 cc of normal saline was placed inside the peritoneal cavity.    The patient was placed in Trendelenburg position.  An inspection of the abdomen and pelvis was performed. The findings are as noted above.    The bilateral ureters were identified.  The adhesion between the left lower uterine segment and the left cul de sac peritoneum was lysed with the Harmonic scalpel.    The left infundibulopelvic ligament was grasped, cauterized and cut with the Ligasure instrument. The left broad ligament and the left round ligaments were sequentially cauterized and cut with the same instrument.  Dissection was performed to the anterior and posterior leaves of the broad ligaments using the Harmonic scalpel.  The incision was carried across the anterior cul- de-sac along the vesicouterine fold and the bladder was dissected away from the cervix. The peritoneum was taken down posteriorly.  The left uterine artery was skeletonized with the Harmonic scalpel.  It was then cauterized and cut with the Ligasure instrument.   Attention was turned to the patient's right-hand side at this time.  The same procedure that was performed on the left side was repeated on the right side with respect to the isolation, cautery, and transection of the vessels and the bladder flap dissection.       The KOH  ring was nicely visible.  The colpotomy incision was performed with the Harmonic scalpel in a circumferential fashion.  The Rumi device was disconnected from the uterus.  An Alexis bag was placed inside the peritoneal cavity, and the specimen was then removed from the peritoneal cavity and brought into the vagina.  A weighted speculum and a deaver retractor were used to facilitate during morcellation of the specimen inside the Alexis bag.  The specimen pieces were sent to Pathology together.    The vaginal cuff was sutured at this time using a running suture of 0 V-Loc.  The vagina was closed from the patient's right hand side to the left hand side and then back 2 sutures towards the midline.  This provided good full-thickness closure of the vaginal cuff.  The laparoscopic needle for suturing was removed from the peritoneal cavity.   The pelvis was irrigated and suctioned.   The pneumoperitoneal was let down.  There was good hemostasis of the operative sites and pedicles.  Arista was placed over  the surgical field.   The 8 mm trocar was removed and the [employer] instrument was used to close the fascia with a 0/0 vicryl suture.    The CO2 pneumoperitoneum was released.  The patient received manual breaths to remove any remaining CO2 gas and all trocars were removed.   The patient's Foley catheter was removed and cystoscopy was performed and the findings are as noted above.  The Foley catheter was left out.    Final inspection of the vagina demonstrated good hemostasis of the vaginal cuff.   All trocar sites were closed with subcuticular sutures of 4-0 Vicryl. Steristips were used on all incisions. Op site dressings were placed over the umbilical and the right incisions    This concluded the patient's procedure.  She was extubated and escorted to the recovery room in stable and awake condition.  There were no complications to the procedure.  All needle, instrument, and sponge counts were  correct.   Lenard Galloway, M.D.

## 2020-01-17 ENCOUNTER — Telehealth: Payer: Self-pay | Admitting: Obstetrics and Gynecology

## 2020-01-17 DIAGNOSIS — D259 Leiomyoma of uterus, unspecified: Secondary | ICD-10-CM | POA: Diagnosis not present

## 2020-01-17 LAB — BASIC METABOLIC PANEL
Anion gap: 8 (ref 5–15)
BUN: 7 mg/dL — ABNORMAL LOW (ref 8–23)
CO2: 25 mmol/L (ref 22–32)
Calcium: 8 mg/dL — ABNORMAL LOW (ref 8.9–10.3)
Chloride: 105 mmol/L (ref 98–111)
Creatinine, Ser: 0.55 mg/dL (ref 0.44–1.00)
GFR calc Af Amer: >60 mL/min (ref >60)
GFR calc non Af Amer: >60 mL/min (ref >60)
Glucose, Bld: 115 mg/dL — ABNORMAL HIGH (ref 70–99)
Potassium: 3.7 mmol/L (ref 3.5–5.1)
Sodium: 138 mmol/L (ref 135–145)

## 2020-01-17 LAB — CBC
HCT: 29.7 % — ABNORMAL LOW (ref 36.0–46.0)
Hemoglobin: 9.4 g/dL — ABNORMAL LOW (ref 12.0–15.0)
MCH: 31.6 pg (ref 26.0–34.0)
MCHC: 31.6 g/dL (ref 30.0–36.0)
MCV: 100 fL (ref 80.0–100.0)
Platelets: 140 10*3/uL — ABNORMAL LOW (ref 150–400)
RBC: 2.97 MIL/uL — ABNORMAL LOW (ref 3.87–5.11)
RDW: 12.9 % (ref 11.5–15.5)
WBC: 6.1 10*3/uL (ref 4.0–10.5)
nRBC: 0 % (ref 0.0–0.2)

## 2020-01-17 LAB — CYTOLOGY - NON PAP

## 2020-01-17 LAB — SURGICAL PATHOLOGY

## 2020-01-17 MED ORDER — KETOROLAC TROMETHAMINE 15 MG/ML IJ SOLN
INTRAMUSCULAR | Status: AC
  Filled 2020-01-17: qty 1

## 2020-01-17 MED ORDER — OXYCODONE-ACETAMINOPHEN 5-325 MG PO TABS
ORAL_TABLET | ORAL | Status: AC
  Filled 2020-01-17: qty 2

## 2020-01-17 MED ORDER — IBUPROFEN 800 MG PO TABS
800.0000 mg | ORAL_TABLET | Freq: Three times a day (TID) | ORAL | 0 refills | Status: DC | PRN
Start: 2020-01-17 — End: 2020-02-27

## 2020-01-17 MED ORDER — OXYCODONE-ACETAMINOPHEN 5-325 MG PO TABS: 1.0000 | ORAL_TABLET | ORAL | 0 refills | Status: DC | PRN

## 2020-01-17 NOTE — Telephone Encounter (Signed)
Please place patient on my schedule for 01/19/20 at 8:30 am. This will be a post op check.  She is aware.

## 2020-01-17 NOTE — Progress Notes (Signed)
1 Day Post-Op Procedure(s) (LRB): TOTAL LAPAROSCOPIC HYSTERECTOMY WITH BILATERAL SALPINGO OOPHORECTOMY/COLLECTION OF PELVIC WASHINGS, LYSIS OF ADHESIONS (N/A) CYSTOSCOPY (N/A)  Subjective: Patient reports incisional pain, tolerating PO, + flatus and no problems voiding.   Ambulating.  Denies shortness of breath, palpitations.  No vaginal bleeding.   Objective: I have reviewed patient's vital signs, intake and output and labs. Vitals:   01/17/20 0215 01/17/20 0630  BP: (!) 115/58 102/63  Pulse: 77 73  Resp: 16 16  Temp: 99.2 F (37.3 C) 98.8 F (37.1 C)  SpO2: 96% 96%     CBC    Component Value Date/Time   WBC 6.1 01/17/2020 0506   RBC 2.97 (L) 01/17/2020 0506   HGB 9.4 (L) 01/17/2020 0506   HGB 12.0 10/26/2019 1245   HGB 12.8 03/15/2013 1434   HCT 29.7 (L) 01/17/2020 0506   PLT 140 (L) 01/17/2020 0506   PLT 219 10/26/2019 1245   MCV 100.0 01/17/2020 0506   MCH 31.6 01/17/2020 0506   MCHC 31.6 01/17/2020 0506   RDW 12.9 01/17/2020 0506   LYMPHSABS 1.3 01/09/2020 1345   MONOABS 0.5 01/09/2020 1345   EOSABS 0.1 01/09/2020 1345   BASOSABS 0.0 01/09/2020 1345   Glucose 115, calcium 8.0 I/O - 2500/1875 cc. General: alert and cooperative Resp: clear to auscultation bilaterally Cardio: regular rate and rhythm, S1, S2 normal, no murmur, click, rub or gallop GI: soft, non-tender; bowel sounds normal; no masses,  no organomegaly and incision: clean, dry and intact Extremities: Ted hose on.   Vagina:  No bleeding.   Assessment: s/p Procedure(s) with comments: TOTAL LAPAROSCOPIC HYSTERECTOMY WITH BILATERAL SALPINGO OOPHORECTOMY/COLLECTION OF PELVIC WASHINGS, LYSIS OF ADHESIONS (N/A) - collection of pelvic washings CYSTOSCOPY (N/A): progressing well and anemia  Asymptomatic anemia.    Plan: Discharge home Post op instructions reviewed in verbal and written form.   Will have patient follow up in the office in 2 days for recheck and CBC.  Anemia precautions given.  Rx  for Percocet and Motrin.    LOS: 0 days    Arloa Koh 01/17/2020, 7:45 AM

## 2020-01-17 NOTE — Progress Notes (Signed)
GYNECOLOGY  VISIT   HPI: 64 y.o.   Married  Caucasian  female   G1P1001 with Patient's last menstrual period was 09/01/2007 (exact date).   here for 2 day follow up  [facility] [place] OOPHORECTOMY/COLLECTION OF PELVIC WASHINGS, LYSIS OF ADHESIONS (N/A Abdomen)  CYSTOSCOPY (N/A Bladder).  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   Her discharge hemoglobin was 9.4 and platelets 140,000.  Pain control good other than discomfort with coughing.  No narcotic use since yesterday.  Using Motrin.  Doing some walking outside up to her barn.  Having BMs.  Voiding a lot.  No vaginal bleeding since arriving home.   Perineum is sore.   No dizziness or lightheadedness.  Tired yesterday but not excessive per patient.   Sleeping.   GYNECOLOGIC HISTORY: Patient's last menstrual period was 09/01/2007 (exact date). Contraception:  Hyst Menopausal hormone therapy:  None Last mammogram:  08-07-19 MRI --see Epic Last pap smear: 06-23-19 Neg:Neg HR HPV,06-09-17 Neg:Neg HR HPV,05-02-15 Neg:Neg HRNeg        OB History    Gravida  1   Para  1   Term  1   Preterm      AB      Living  1     SAB      TAB      Ectopic      Multiple      Live Births                 Patient Active Problem List   Diagnosis Date Noted  . Status post laparoscopic hysterectomy 01/16/2020  . Genetic testing 08/04/2019  . Family history of uterine cancer   . Family history of stomach cancer   . Family history of bone cancer   . Family history of brain cancer   . Postmenopausal bleeding 07/09/2019  . Right ovarian cyst 07/09/2019  . Malignant neoplasm of lower-inner quadrant of right breast of female, estrogen receptor positive ([place]) 07/06/2019  . History of Clostridioides difficile infection 06/17/2018  . Fibroids 08/06/2017  . Breast mass in female-left at 12:00-benign 08/29/2012    Past Medical History:  Diagnosis  Date  . Breast cancer, right ([place])   . Contact lens/glasses fitting    wears contacts or glasses  . Diarrhea    --post gallbladder surgery  . Family history of bone cancer   . Family history of brain cancer   . Family history of stomach cancer   . Family history of uterine cancer   . Fibroids   . History of radiation therapy   . Hypertension   . No pertinent past medical history   . PONV (postoperative nausea and vomiting)   . Right ovarian cyst   . Vertigo     Past Surgical History:  Procedure Laterality Date  . BLADDER SUSPENSION    . BREAST BIOPSY  09/08/2012   Procedure: BREAST BIOPSY;  Surgeon: Odis Hollingshead, MD;  Location: [place];  Service: General;  Laterality: Left;  remove left breast mass  . BREAST LUMPECTOMY WITH RADIOACTIVE SEED AND SENTINEL LYMPH NODE BIOPSY Right 10/04/2019   Procedure: RIGHT BREAST LUMPECTOMY WITH BRACKETED RADIOACTIVE SEEDS, RIGHT BREAST RADIOACTIVE SEED GUIDED EXCISION BIOPSY, AND RIGHT SENTINEL LYMPH NODE BIOPSY;  Surgeon: Stark Klein, MD;  Location: [facility];  Service: General;  Laterality: Right;  . BREAST SURGERY     lumpectomy x2  . BUNIONECTOMY Right 01/2016  .  CHOLECYSTECTOMY    . COLONOSCOPY    . CYSTOSCOPY N/A 01/16/2020   Procedure: CYSTOSCOPY;  Surgeon: Nunzio Cobbs, MD;  Location: [facility];  Service: Gynecology;  Laterality: N/A;  . MASTOPEXY Right 10/04/2019   Procedure: RIGHT BREAST MASTOPEXY;  Surgeon: Stark Klein, MD;  Location: [place];  Service: General;  Laterality: Right;  . RE-EXCISION OF BREAST LUMPECTOMY Right 10/31/2019   Procedure: RIGHT RE-EXCISION OF BREAST LUMPECTOMY;  Surgeon: Stark Klein, MD;  Location: [place];  Service: General;  Laterality: Right;  . TOTAL LAPAROSCOPIC HYSTERECTOMY WITH BILATERAL SALPINGO OOPHORECTOMY N/A 01/16/2020   Procedure: TOTAL LAPAROSCOPIC HYSTERECTOMY WITH BILATERAL SALPINGO  OOPHORECTOMY/COLLECTION OF PELVIC WASHINGS, LYSIS OF ADHESIONS;  Surgeon: Nunzio Cobbs, MD;  Location: [facility];  Service: Gynecology;  Laterality: N/A;  collection of pelvic washings  . TUBAL LIGATION      Current Outpatient Medications  Medication Sig Dispense Refill  . Ascorbic Acid (VITAMIN C) 100 MG tablet Take 100 mg by mouth daily.    . Cholecalciferol (VITAMIN D) 10 MCG/ML LIQD Take 1 drop by mouth in the morning and at bedtime.     Marland Kitchen CRANBERRY PO Take 1 tablet by mouth in the morning and at bedtime.     . hydrocortisone 2.5 % cream Apply topically 3 (three) times daily as needed. 28 g 3  . ibuprofen (ADVIL) 800 MG tablet Take 1 tablet (800 mg total) by mouth every 8 (eight) hours as needed for mild pain. 30 tablet 0  . Lactobacillus Rhamnosus, GG, (CULTURELLE PO) Take 1 capsule by mouth daily.     Marland Kitchen loperamide (IMODIUM) 2 MG capsule Take 2 mg by mouth in the morning and at bedtime.     . Magnesium 100 MG TABS Take 100 mg by mouth daily.     . Melatonin 10 MG CAPS Take 10 mg by mouth at bedtime.     Marland Kitchen nystatin ointment (MYCOSTATIN) Apply 1 application topically 2 (two) times daily. Apply to affected area for up to 7 days. 30 g 0  . oxyCODONE-acetaminophen (PERCOCET/ROXICET) 5-325 MG tablet Take 1-2 tablets by mouth every 4 (four) hours as needed for moderate pain. 20 tablet 0  . Pumpkin Seed-Soy Germ (AZO BLADDER CONTROL/GO-LESS PO) Take 1 tablet by mouth in the morning and at bedtime.     Marland Kitchen QUERCETIN PO Take 1 tablet by mouth in the morning and at bedtime.    . tamoxifen (NOLVADEX) 20 MG tablet Take 20 mg by mouth daily.    Marland Kitchen triamcinolone (KENALOG) 0.025 % ointment Apply 1 application topically 2 (two) times daily. Use for one week. 30 g 0  . TURMERIC PO Take 5 mLs by mouth daily.    . valsartan (DIOVAN) 80 MG tablet Take 80 mg by mouth at bedtime.    Marland Kitchen venlafaxine XR (EFFEXOR-XR) 37.5 MG 24 hr capsule Take 37.5 mg by mouth daily.    . vitamin E 180 MG  (400 UNITS) capsule Take 400 Units by mouth daily.     No current facility-administered medications for this visit.     ALLERGIES: Flagyl [metronidazole] and Other  Family History  Problem Relation Age of Onset  . Hypertension Father   . Stomach cancer Father        may have been colon, diagnosed in his 66s  . Depression Mother   . Dementia Mother   . Thyroid disease Sister   . Heart disease Sister   .  Brain cancer Maternal Aunt 80  . Cancer Maternal Grandmother        undetermined type, diagnosed in her 7s  . Bone cancer Paternal Grandfather 59  . Uterine cancer Paternal Aunt 49  . Uterine cancer Cousin        paternal 1st cousin, diagnosed in her late 55s  . Uterine cancer Cousin 40       paternal 1st cousin    Social History   Socioeconomic History  . Marital status: Married    Spouse name: Daryl   . Number of children: Not on file  . Years of education: Not on file  . Highest education level: Not on file  Occupational History  . Not on file  Tobacco Use  . Smoking status: Former Smoker    Types: Cigarettes    Quit date: 03/15/1996    Years since quitting: 23.8  . Smokeless tobacco: Never Used  Substance and Sexual Activity  . Alcohol use: Yes    Alcohol/week: 0.0 standard drinks    Comment: occ wine every 2 weeks  . Drug use: No  . Sexual activity: Not Currently    Partners: Male    Birth control/protection: Post-menopausal, Surgical    Comment: BTL  Other Topics Concern  . Not on file  Social History Narrative  . Not on file   Social Determinants of Health   Financial Resource Strain:   . Difficulty of Paying Living Expenses:   Food Insecurity:   . Worried About Charity fundraiser in the Last Year:   . Arboriculturist in the Last Year:   Transportation Needs:   . Film/video editor (Medical):   Marland Kitchen Lack of Transportation (Non-Medical):   Physical Activity:   . Days of Exercise per Week:   . Minutes of Exercise per Session:   Stress:   .  Feeling of Stress :   Social Connections:   . Frequency of Communication with Friends and Family:   . Frequency of Social Gatherings with Friends and Family:   . Attends Religious Services:   . Active Member of Clubs or Organizations:   . Attends Archivist Meetings:   Marland Kitchen Marital Status:   Intimate Partner Violence:   . Fear of Current or Ex-Partner:   . Emotionally Abused:   Marland Kitchen Physically Abused:   . Sexually Abused:     Review of Systems  Constitutional: Negative.   HENT: Negative.   Eyes: Negative.   Respiratory: Negative.   Cardiovascular: Negative.   Gastrointestinal: Negative.   Endocrine: Negative.   Genitourinary:       Perineal soreness  Musculoskeletal: Negative.   Skin: Negative.   Allergic/Immunologic: Negative.   Neurological: Negative.   Hematological: Negative.   Psychiatric/Behavioral: Negative.     PHYSICAL EXAMINATION:    BP 130/82 (BP Location: Right Arm, Patient Position: Sitting, Cuff Size: Normal)   Pulse 72   Temp (!) 97.3 F (36.3 C) (Skin)   Resp 14   Ht 5\' 5"  (1.651 m)   Wt 147 lb (66.7 kg)   LMP 09/01/2007 (Exact Date)   BMI 24.46 kg/m     General appearance: alert, cooperative and appears stated age   Abdomen: incisions are intact, soft, non-tender, no masses,  no organomegaly  Pelvic: External genitalia:  no lesions              Urethra:  normal appearing urethra with no masses, tenderness or lesions  Perineum with split in the skin.    Chaperone was present for exam.  ASSESSMENT  Status post total laparoscopic hysterectomy with BSO, collection of pelvic washings.  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   Post op anemia.  Perineal soreness.  PLAN  We discussed surgery findings, and pathology report.  I reviewed the information from Dr. Little Ishikawa ONC, with whom I communicated through a staff message about her Signa Kell tumor.  No follow up is needed. Lidocaine ointment to perineum.   Sitz baths.  Check stat CBC now.  Fu for 6 week post op visit.    An After Visit Summary was printed and given to the patient.

## 2020-01-17 NOTE — Telephone Encounter (Signed)
OV scheduled for 01/19/20 at 0830 with Dr. Quincy Simmonds.   Routing to Dr. Antony Blackbird.   Encounter closed.

## 2020-01-17 NOTE — Discharge Instructions (Signed)

## 2020-01-18 ENCOUNTER — Other Ambulatory Visit: Payer: Self-pay

## 2020-01-19 ENCOUNTER — Encounter: Payer: Self-pay | Admitting: Obstetrics and Gynecology

## 2020-01-19 ENCOUNTER — Telehealth: Payer: Self-pay | Admitting: Obstetrics and Gynecology

## 2020-01-19 ENCOUNTER — Telehealth: Payer: Self-pay

## 2020-01-19 ENCOUNTER — Ambulatory Visit (INDEPENDENT_AMBULATORY_CARE_PROVIDER_SITE_OTHER): Payer: Commercial Managed Care - PPO | Admitting: Obstetrics and Gynecology

## 2020-01-19 VITALS — BP 130/82 | HR 72 | Temp 97.3°F | Resp 14 | Ht 65.0 in | Wt 147.0 lb

## 2020-01-19 DIAGNOSIS — D649 Anemia, unspecified: Secondary | ICD-10-CM

## 2020-01-19 DIAGNOSIS — Z9889 Other specified postprocedural states: Secondary | ICD-10-CM

## 2020-01-19 LAB — CBC WITH DIFFERENTIAL/PLATELET
Basophils Absolute: 0 10*3/uL (ref 0.0–0.2)
Basos: 1 %
EOS (ABSOLUTE): 0.2 10*3/uL (ref 0.0–0.4)
Eos: 5 %
Hematocrit: 32.8 % — ABNORMAL LOW (ref 34.0–46.6)
Hemoglobin: 10.8 g/dL — ABNORMAL LOW (ref 11.1–15.9)
Lymphocytes Absolute: 0.8 10*3/uL (ref 0.7–3.1)
Lymphs: 22 %
MCH: 30.9 pg (ref 26.6–33.0)
MCHC: 32.9 g/dL (ref 31.5–35.7)
MCV: 94 fL (ref 79–97)
Monocytes Absolute: 0.5 10*3/uL (ref 0.1–0.9)
Monocytes: 12 %
Neutrophils Absolute: 2.3 10*3/uL (ref 1.4–7.0)
Neutrophils: 60 %
Platelets: 216 10*3/uL (ref 150–450)
RBC: 3.5 x10E6/uL — ABNORMAL LOW (ref 3.77–5.28)
RDW: 12.5 % (ref 11.7–15.4)
WBC: 3.8 10*3/uL (ref 3.4–10.8)

## 2020-01-19 MED ORDER — LIDOCAINE 5 % EX OINT
1.0000 | TOPICAL_OINTMENT | Freq: Three times a day (TID) | CUTANEOUS | 0 refills | Status: DC
Start: 2020-01-19 — End: 2020-02-27

## 2020-01-19 NOTE — Telephone Encounter (Signed)
Left message for pt to return call to triage RN. 

## 2020-01-19 NOTE — Telephone Encounter (Signed)
Patient is returning call.  °

## 2020-01-19 NOTE — Telephone Encounter (Signed)
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 Everything looks very reassuring.Although very rare tumors, the vast majority of these are benign and have an excellent prognosis with surgical resection.  Hope this helps! Let me know if I can provide any further assistance, Kat ----- Message ----- From: Nunzio Cobbs, MD Sent: 01/18/2020  11:03 AM EDT To: Lafonda Mosses, MD Subject: benign Brenner tumor of the ovary              Hello Dr. Berline Lopes,   I just received a pathology report for a hysterectomy patient of mine showing a benign Brenner tumor of the ovary.   My patient is a 64 year old G1P1 with recently treated right breast cancer who underwent a total laparoscopic hysterectomy with bilateral salpingo-oophorectomy, and collection of pelvic washings for recurrent postmenopausal bleeding and a small calcified right ovarian cyst.  She had a negative EMB prior to surgery.   Her pathology report showed fibroids adenomyosis, benign endometrium, benign cervix, benign tubes and a benign Brenner tumor of the ovary. Her pelvic washings were benign also.  Intraop findings were unremarkable.  Is this a patient you will need to see for any follow up care?  I hope you are doing well!  [place]

## 2020-01-19 NOTE — Telephone Encounter (Signed)
-----   Message from Nunzio Cobbs, MD sent at 01/19/2020  1:04 PM EDT ----- Please let patient know that her hemoglobin is improved and is now 10.8.  Taking a multivitamin daily with iron will help this to return to normal more quickly.  The iron can also constipate.

## 2020-01-19 NOTE — Telephone Encounter (Signed)
Spoke with pt. Pt given results of STAT CBC today and recommendations per Dr Quincy Simmonds. Pt agreeable and verbalized understanding and will start taking multivitamin with iron. Iron precautions reviewed. Pt agreeable.  Pt has 6 week post op appt.  Routing to Dr Quincy Simmonds.  Encounter closed.

## 2020-01-23 ENCOUNTER — Ambulatory Visit: Payer: Commercial Managed Care - PPO | Admitting: Obstetrics and Gynecology

## 2020-01-27 NOTE — Discharge Summary (Signed)
Physician Discharge Summary  Patient ID: Theresa [name] MRN: [id number] DOB/AGE: Aug 09, 1956 64 y.o.  Admit date: 01/16/2020 Discharge date:01/17/20  Admission Diagnoses: 1.  Recurrent postmenopausal bleeding 2.  Fibroids  3.  Tamoxifen use. 4.  Right ovarian cyst.   Discharge Diagnoses:  1.  Recurrent postmenopausal bleeding 2.  Fibroids  3.  Tamoxifen use. 4.  Right ovarian cyst. 5.  Status post  total laparoscopic hysterectomy with bilateral salpingo-oophorectomy,lysis of adhesion, collection of pelvic washings, vaginal morcellation of specimen in an Alexis bag, and cystoscopy  6.  Postop anemia.   Active Problems:   Status post laparoscopic hysterectomy   Discharged Condition: good  Hospital Course: The patient was admitted on 01/16/20 for a total laparoscopic hysterectomy with bilateral salpingo-oophorectomy,lysis of adhesion, collection of pelvic washings, vaginal morcellation of specimen in an Alexis bag, and cystoscopy which were performed without complication while under general anesthesia.  The patient's post op course was uneventful.  She had bigeminy noted in the PACU and was cleared by anesthesia to go to the short term stay until without telemetry.  She denied shortness of breath, chest pain, and palpitations.  She received morphine and Toradol for pain control initially, and this was converted over to Percocet and Motrin when the patient began taking po well.  She ambulated independently and wore PAS and Ted hose for DVT prophylaxis while in bed. She received Lovenox preop for DVT prophylaxis.  The patient voided spontaneously after surgery. The patient's vital signs remained stable and she demonstrated no signs of infection during her hospitalization.  The patient's post op day zero hemoglobin was 10.3 and her post op day one Hgb was 9.4.   She was tolerating the this well and demonstrated to signs of clinical anemia or an acute abdomen.  She had very minimal vaginal  bleeding, and her incision(s) demonstrated no signs of erythema or significant drainage.  She was found to be in good condition and ready for discharge on post op day one with office follow up planned for 01/19/20.  Consults: None  Significant Diagnostic Studies: labs: See Hospital Course  Treatments: surgery: Total laparoscopic hysterectomy with bilateral salpingo-oophorectomy,lysis of adhesion, collection of pelvic washings, vaginal morcellation of specimen in an Alexis bag, and cystoscopy  Discharge Exam: Blood pressure (!) 105/56, pulse 87, temperature 99.3 F (37.4 C), resp. rate 16, height 5\' 5"  (1.651 m), weight 65.3 kg, last menstrual period 09/01/2007, SpO2 96 %.  I/O - 2500/1875 cc.  General: alert and cooperative Resp: clear to auscultation bilaterally Cardio: regular rate and rhythm, S1, S2 normal, no murmur, click, rub or gallop GI: soft, non-tender; bowel sounds normal; no masses,  no organomegaly and incision: clean, dry and intact Extremities: Ted hose on.   Vagina:  No bleeding.   Disposition: Discharge disposition: 01-Home or Self Care     Discharge instructions were reviewed in both verbal and written form.   She was given anemia precautions.   Discharge Instructions    Discharge patient   Complete by: As directed    Discharge disposition: 01-Home or Self Care   Discharge patient date: 01/17/2020     Allergies as of 01/17/2020      Reactions   Flagyl [metronidazole]    C-Diff   Other    Dermabond - redness and burning, blistering      Medication List    TAKE these medications   AZO BLADDER CONTROL/GO-LESS PO Take 1 tablet by mouth in the morning and at bedtime.   CRANBERRY  PO Take 1 tablet by mouth in the morning and at bedtime.   CULTURELLE PO Take 1 capsule by mouth daily.   hydrocortisone 2.5 % cream Apply topically 3 (three) times daily as needed.   ibuprofen 800 MG tablet Commonly known as: ADVIL Take 1 tablet (800 mg total) by mouth  every 8 (eight) hours as needed for mild pain. Notes to patient: Next dose is due at 1 pm as needed for pain   loperamide 2 MG capsule Commonly known as: IMODIUM Take 2 mg by mouth in the morning and at bedtime.   Magnesium 100 MG Tabs Take 100 mg by mouth daily.   Melatonin 10 MG Caps Take 10 mg by mouth at bedtime.   nystatin ointment Commonly known as: MYCOSTATIN Apply 1 application topically 2 (two) times daily. Apply to affected area for up to 7 days.   oxyCODONE-acetaminophen 5-325 MG tablet Commonly known as: PERCOCET/ROXICET Take 1-2 tablets by mouth every 4 (four) hours as needed for moderate pain. Notes to patient: Next dose is due at noon as needed for pain   QUERCETIN PO Take 1 tablet by mouth in the morning and at bedtime.   tamoxifen 20 MG tablet Commonly known as: NOLVADEX Take 20 mg by mouth daily.   triamcinolone 0.025 % ointment Commonly known as: KENALOG Apply 1 application topically 2 (two) times daily. Use for one week.   TURMERIC PO Take 5 mLs by mouth daily.   valsartan 80 MG tablet Commonly known as: DIOVAN Take 80 mg by mouth at bedtime.   venlafaxine XR 37.5 MG 24 hr capsule Commonly known as: EFFEXOR-XR Take 37.5 mg by mouth daily.   vitamin C 100 MG tablet Take 100 mg by mouth daily.   Vitamin D 10 MCG/ML Liqd Take 1 drop by mouth in the morning and at bedtime.   vitamin E 180 MG (400 UNITS) capsule Take 400 Units by mouth daily.      Follow-up Information    Nunzio Cobbs, MD In 2 days.   Specialty: Obstetrics and Gynecology Contact information: [address] [place] [address] [phone]           Signed: Arloa Koh 01/27/2020, 2:44 PM

## 2020-02-06 ENCOUNTER — Ambulatory Visit: Payer: Commercial Managed Care - PPO | Admitting: Obstetrics and Gynecology

## 2020-02-06 ENCOUNTER — Telehealth: Payer: Self-pay

## 2020-02-06 NOTE — Telephone Encounter (Signed)
I called the patient today about her upcoming follow-up appointment in radiation oncology.   Given the state of the COVID-19 pandemic, concerning case numbers in our community, and guidance from [facility], I offered a phone assessment with the patient to determine if coming to the clinic was necessary. She accepted.  I let the patient know that I had spoken with Dr. Isidore Moos, and she wanted them to know the importance of washing their hands for at least 20 seconds at a time, especially after going out in public, and before they eat.  Limit going out in public whenever possible. Do not touch your face, unless your hands are clean, such as when bathing. Get plenty of rest, eat well, and stay hydrated. Patient verbalized understanding and agreement.  The patient denies any symptomatic concerns. She states she has her energy and appetite back, and over all "feels great". She mentioned she had a hysterectomy 2 weeks after she finished radiation, and tolerated the procedure without difficulty and has healed well. She denies any swelling to the breast or axilla, or issues with range of motion to the right arm. Specifically, she reports good healing of their skin in the radiation fields.  Skin is intact and almost completely back to her baseline coloring. I recommended that she continue skin care by applying oil or lotion with vitamin E to the skin in the radiation fields, BID, for 2 more months.  She verbalized understanding and agreement.  Continue follow-up with medical oncology - follow-up is scheduled on 03/08/2020 with Dr. Lurline Del.  I explained that yearly mammograms are important for patients with intact breast tissue, and physical exams are important after mastectomy for patients that cannot undergo mammography.  I encouraged her to call if she had further questions or concerns about her healing. Otherwise, she will follow-up PRN in radiation oncology. Patient is pleased with this plan,  and we will cancel her upcoming follow-up to reduce the risk of COVID-19 transmission.

## 2020-02-07 ENCOUNTER — Ambulatory Visit: Payer: Self-pay | Admitting: Radiation Oncology

## 2020-02-13 ENCOUNTER — Other Ambulatory Visit: Payer: Self-pay | Admitting: *Deleted

## 2020-02-15 ENCOUNTER — Other Ambulatory Visit: Payer: Self-pay | Admitting: *Deleted

## 2020-02-15 ENCOUNTER — Other Ambulatory Visit: Payer: Self-pay | Admitting: Oncology

## 2020-02-19 ENCOUNTER — Other Ambulatory Visit: Payer: Commercial Managed Care - PPO

## 2020-02-19 ENCOUNTER — Ambulatory Visit: Payer: Commercial Managed Care - PPO | Admitting: Oncology

## 2020-02-27 ENCOUNTER — Ambulatory Visit (INDEPENDENT_AMBULATORY_CARE_PROVIDER_SITE_OTHER): Payer: Commercial Managed Care - PPO | Admitting: Obstetrics and Gynecology

## 2020-02-27 ENCOUNTER — Encounter: Payer: Self-pay | Admitting: Obstetrics and Gynecology

## 2020-02-27 ENCOUNTER — Other Ambulatory Visit: Payer: Self-pay

## 2020-02-27 VITALS — BP 110/66 | HR 60 | Ht 64.0 in | Wt 148.0 lb

## 2020-02-27 DIAGNOSIS — Z9889 Other specified postprocedural states: Secondary | ICD-10-CM

## 2020-02-27 NOTE — Progress Notes (Signed)
GYNECOLOGY  VISIT   HPI: 64 y.o.   Married  Caucasian  female   [id number] with Patient's last menstrual period was 09/01/2007 (exact date).   here for 6 week status post TOTAL LAPAROSCOPIC HYSTERECTOMY WITH BILATERAL SALPINGO OOPHORECTOMY/COLLECTION OF PELVIC WASHINGS, LYSIS OF ADHESIONS (N/A Abdomen) CYSTOSCOPY (N/A Bladder).  No problems.  No pain.  She had some brown spotting a few weeks ago.  Bladder and bowel functioning well.  Good energy level.  Walking 16,000 steps a day.    GYNECOLOGIC HISTORY: Patient's last menstrual period was 09/01/2007 (exact date). Contraception: Tubal/Hyst Menopausal hormone therapy:  None Last mammogram: 08-07-19 MRI --see Epic Last pap smear: 06-23-19 Neg:Neg HR HPV,06-09-17 Neg:Neg HR HPV,05-02-15 Neg:Neg HRNeg        OB History    Gravida  1   Para  1   Term  1   Preterm      AB      Living  1     SAB      TAB      Ectopic      Multiple      Live Births                 Patient Active Problem List   Diagnosis Date Noted  . Status post laparoscopic hysterectomy 01/16/2020  . Genetic testing 08/04/2019  . Family history of uterine cancer   . Family history of stomach cancer   . Family history of bone cancer   . Family history of brain cancer   . Postmenopausal bleeding 07/09/2019  . Right ovarian cyst 07/09/2019  . Malignant neoplasm of lower-inner quadrant of right breast of female, estrogen receptor positive ([place]) 07/06/2019  . History of Clostridioides difficile infection 06/17/2018  . Fibroids 08/06/2017  . Breast mass in female-left at 12:00-benign 08/29/2012    Past Medical History:  Diagnosis Date  . Breast cancer, right ([place])   . Contact lens/glasses fitting    wears contacts or glasses  . Diarrhea    --post gallbladder surgery  . Family history of bone cancer   . Family history of brain cancer   . Family history of stomach cancer   . Family history of uterine cancer   . Fibroids   . History of  radiation therapy   . Hypertension   . No pertinent past medical history   . PONV (postoperative nausea and vomiting)   . Right ovarian cyst   . Vertigo     Past Surgical History:  Procedure Laterality Date  . BLADDER SUSPENSION    . BREAST BIOPSY  09/08/2012   Procedure: BREAST BIOPSY;  Surgeon: Odis Hollingshead, MD;  Location: [place];  Service: General;  Laterality: Left;  remove left breast mass  . BREAST LUMPECTOMY WITH RADIOACTIVE SEED AND SENTINEL LYMPH NODE BIOPSY Right 10/04/2019   Procedure: RIGHT BREAST LUMPECTOMY WITH BRACKETED RADIOACTIVE SEEDS, RIGHT BREAST RADIOACTIVE SEED GUIDED EXCISION BIOPSY, AND RIGHT SENTINEL LYMPH NODE BIOPSY;  Surgeon: Stark Klein, MD;  Location: [place];  Service: General;  Laterality: Right;  . BREAST SURGERY     lumpectomy x2  . BUNIONECTOMY Right 01/2016  . CHOLECYSTECTOMY    . COLONOSCOPY    . CYSTOSCOPY N/A 01/16/2020   Procedure: CYSTOSCOPY;  Surgeon: Nunzio Cobbs, MD;  Location[facility];  Service: Gynecology;  Laterality: N/A;  . MASTOPEXY Right 10/04/2019   Procedure: RIGHT BREAST MASTOPEXY;  Surgeon: Stark Klein, MD;  Location:  [place];  Service: General;  Laterality: Right;  . RE-EXCISION OF BREAST LUMPECTOMY Right 10/31/2019   Procedure: RIGHT RE-EXCISION OF BREAST LUMPECTOMY;  Surgeon: Stark Klein, MD;  Location: [place];  Service: General;  Laterality: Right;  . TOTAL LAPAROSCOPIC HYSTERECTOMY WITH BILATERAL SALPINGO OOPHORECTOMY N/A 01/16/2020   Procedure: TOTAL LAPAROSCOPIC HYSTERECTOMY WITH BILATERAL SALPINGO OOPHORECTOMY/COLLECTION OF PELVIC WASHINGS, LYSIS OF ADHESIONS;  Surgeon: Nunzio Cobbs, MD;  Location[facility];  Service: Gynecology;  Laterality: N/A;  collection of pelvic washings  . TUBAL LIGATION      Current Outpatient Medications  Medication Sig Dispense Refill  . Ascorbic Acid  (VITAMIN C) 100 MG tablet Take 100 mg by mouth daily.    . Cholecalciferol (VITAMIN D) 10 MCG/ML LIQD Take 1 drop by mouth in the morning and at bedtime.     Marland Kitchen CRANBERRY PO Take 1 tablet by mouth in the morning and at bedtime.     . hydrocortisone 2.5 % cream Apply topically 3 (three) times daily as needed. 28 g 3  . Lactobacillus Rhamnosus, GG, (CULTURELLE PO) Take 1 capsule by mouth daily.     Marland Kitchen loperamide (IMODIUM) 2 MG capsule Take 2 mg by mouth in the morning and at bedtime.     . Magnesium 100 MG TABS Take 100 mg by mouth daily.     . Melatonin 10 MG CAPS Take 10 mg by mouth at bedtime.     . meloxicam (MOBIC) 7.5 MG tablet Take 1 tablet by mouth daily.    . Pumpkin Seed-Soy Germ (AZO BLADDER CONTROL/GO-LESS PO) Take 1 tablet by mouth in the morning and at bedtime.     Marland Kitchen QUERCETIN PO Take 1 tablet by mouth in the morning and at bedtime.    . tamoxifen (NOLVADEX) 20 MG tablet Take 20 mg by mouth daily.    . TURMERIC PO Take 5 mLs by mouth daily.    . valsartan (DIOVAN) 80 MG tablet Take 80 mg by mouth at bedtime.    Marland Kitchen venlafaxine XR (EFFEXOR-XR) 37.5 MG 24 hr capsule Take 37.5 mg by mouth daily.    . vitamin E 180 MG (400 UNITS) capsule Take 400 Units by mouth daily.     No current facility-administered medications for this visit.     ALLERGIES: Flagyl [metronidazole], Other, and Adhesive [tape]  Family History  Problem Relation Age of Onset  . Hypertension Father   . Stomach cancer Father        may have been colon, diagnosed in his 50s  . Depression Mother   . Dementia Mother   . Thyroid disease Sister   . Heart disease Sister   . Brain cancer Maternal Aunt 80  . Cancer Maternal Grandmother        undetermined type, diagnosed in her 4s  . Bone cancer Paternal Grandfather 4  . Uterine cancer Paternal Aunt 72  . Uterine cancer Cousin        paternal 1st cousin, diagnosed in her late 41s  . Uterine cancer Cousin 39       paternal 1st cousin    Social History    Socioeconomic History  . Marital status: Married    Spouse name: Daryl   . Number of children: Not on file  . Years of education: Not on file  . Highest education level: Not on file  Occupational History  . Not on file  Tobacco Use  . Smoking status: Former  Smoker    Types: Cigarettes    Quit date: 03/15/1996    Years since quitting: 23.9  . Smokeless tobacco: Never Used  Vaping Use  . Vaping Use: Never used  Substance and Sexual Activity  . Alcohol use: Yes    Alcohol/week: 0.0 standard drinks    Comment: occ wine every 2 weeks  . Drug use: No  . Sexual activity: Not Currently    Partners: Male    Birth control/protection: Post-menopausal, Surgical    Comment: BTL  Other Topics Concern  . Not on file  Social History Narrative  . Not on file   Social Determinants of Health   Financial Resource Strain:   . Difficulty of Paying Living Expenses:   Food Insecurity:   . Worried About Charity fundraiser in the Last Year:   . Arboriculturist in the Last Year:   Transportation Needs:   . Film/video editor (Medical):   Marland Kitchen Lack of Transportation (Non-Medical):   Physical Activity:   . Days of Exercise per Week:   . Minutes of Exercise per Session:   Stress:   . Feeling of Stress :   Social Connections:   . Frequency of Communication with Friends and Family:   . Frequency of Social Gatherings with Friends and Family:   . Attends Religious Services:   . Active Member of Clubs or Organizations:   . Attends Archivist Meetings:   Marland Kitchen Marital Status:   Intimate Partner Violence:   . Fear of Current or Ex-Partner:   . Emotionally Abused:   Marland Kitchen Physically Abused:   . Sexually Abused:     Review of Systems  All other systems reviewed and are negative.   PHYSICAL EXAMINATION:    BP 110/66   Pulse 60   Ht 5\' 4"  (1.626 m)   Wt 148 lb (67.1 kg)   LMP 09/01/2007 (Exact Date)   BMI 25.40 kg/m     General appearance: alert, cooperative and appears stated  age   Abdomen: incisions intact, abdomen is soft, non-tender, no masses,  no organomegaly   Pelvic: External genitalia:  no lesions              Urethra:  normal appearing urethra with no masses, tenderness or lesions              Bartholins and Skenes: normal                 Vagina: normal appearing vagina with normal color and discharge, no lesions              Cervix: absent.  Suture line intact and vaginal mucosa has appearance of more recent surgical incision.                Bimanual Exam:  Uterus:  Absent              Adnexa: no mass, fullness, tenderness             Chaperone was present for exam.  ASSESSMENT  Status post TLH, BSO, LOA, cystoscopy.  Vaginal cuff still healing from surgery.  PLAN  No exercise or lifting for 4 more weeks.  No sexual activity for 12 weeks post op.  FU for recheck in 4 weeks.

## 2020-03-06 NOTE — Progress Notes (Signed)
  Patient Name: Theresa [name] MRN: [id number] DOB: Sep 21, 1955 Referring Physician: [name] (Profile Not Attached) Date of Service: 01/05/2020 [facility], [place]                                                        End Of Treatment Note  Diagnoses: C50.311-Malignant neoplasm of lower-inner quadrant of right female breast  Cancer Staging: Cancer Staging Malignant neoplasm of lower-inner quadrant of right breast of female, estrogen receptor positive ([place]) Staging form: Breast, AJCC 8th Edition - Clinical stage from 07/26/2019: Stage IA (cT1c, cN0, cM0, G2, ER+, PR-, HER2-) - Unsigned - Pathologic stage from 10/04/2019: Stage IIB (pT2, pN1a(sn), cM0, G2, ER+, PR-, HER2-) - Signed by Gardenia Phlegm, NP on 10/18/2019   Intent: Curative  Radiation Treatment Dates: 11/27/2019 through 01/05/2020 Site Technique Total Dose (Gy) Dose per Fx (Gy) Completed Fx Beam Energies  Breast, Right: Breast_Rt 3D 50/50 2 25/25 6X, 10X  Breast, Right: Breast_Rt_SCV_PAB 3D 50/50 2 25/25 6X, 10X  Breast, Right: Breast_Rt_Bst 3D 10/10 2 5/5 6X, 10X   Narrative: The patient tolerated radiation therapy relatively well.   Plan: The patient will follow-up with radiation oncology in 1 mo, or as needed. -----------------------------------  Eppie Gibson, MD

## 2020-03-07 ENCOUNTER — Other Ambulatory Visit: Payer: Self-pay

## 2020-03-07 DIAGNOSIS — Z170 Estrogen receptor positive status [ER+]: Secondary | ICD-10-CM

## 2020-03-07 DIAGNOSIS — C50311 Malignant neoplasm of lower-inner quadrant of right female breast: Secondary | ICD-10-CM

## 2020-03-07 NOTE — Progress Notes (Signed)
[place]  [phone] [phone]     ID: Theresa [name] DOB: [birth year]  MR#: [id number]  RCV#:[id number]  Patient Care Team: [name], MD as PCP - General (Internal Medicine) Mauro Kaufmann, RN as Oncology Nurse Navigator Rockwell Germany, RN as Oncology Nurse Navigator Stark Klein, MD as Consulting Physician (General Surgery) Gwynevere Lizana, Virgie Dad, MD as Consulting Physician (Oncology) Eppie Gibson, MD as Attending Physician (Radiation Oncology) Nunzio Cobbs, MD as Consulting Physician (Obstetrics and Gynecology) Rolm Bookbinder, MD as Consulting Physician (Dermatology) Ardis Hughs, MD as Consulting Physician (Urology) Juanita Craver, MD as Consulting Physician (Gastroenterology) Chauncey Cruel, MD OTHER MD:  CHIEF COMPLAINT: Estrogen receptor positive breast cancer  CURRENT TREATMENT: Tamoxifen   INTERVAL HISTORY: Theresa [name] returns today for follow up of her estrogen receptor positive breast cancer.   Since her last visit, she underwent re-excision of the positive margin on 10/31/2019. Pathology [id number]) showed no carcinoma.  She was referred back to Dr. Isidore Moos on 11/21/2019 to discuss radiation therapy. She received treatment from 11/27/2019 through 01/05/2020.   She also underwent hysterectomy with BSO on 01/16/2020 under Dr. Quincy Simmonds for uterine fibroids, postmenopausal bleeding, and right ovarian cyst. Pathology from the procedure ([id number]) was benign, as expected.   REVIEW OF SYSTEMS: Theresa [name] is a bit frustrated because she has not been able to get back on her horses, but she hopes after she sees Dr. Quincy Simmonds in about a month she will be released to do that.  She has had no urinary incontinence issues or unusual pain or bleeding from the surgery.  She was very reassured by the results.  She is walking about 14-18,000 steps a day because she is so busy with the animals.  She is still having some hot flashes but they are  better since she started the low-dose venlafaxine.  A detailed review of systems today was otherwise stable.  HISTORY OF CURRENT ILLNESS: From the original intake note:  Renesha has a history of prior left breast excision on 09/08/2012. Pathology from this ([id number]) showed: cyst with features of rupture and associated fat necrosis; no evidence of malignancy.  She had routine screening mammography on 06/14/2019 showing a possible abnormality in the right breast. She then underwent right diagnostic mammography with tomography and right breast ultrasonography at [facility] on 06/21/2019 showing: breast density category C; 1.1 cm irregular mass in the right breast at 3:30.; adjacent smaller 6 mm mass in the right breast at 3:30; no suspicious right axillary lymph nodes.  Accordingly on 07/03/2019 she proceeded to biopsy of the right breast areas in question. The pathology from this procedure ([id number]) showed: invasive mammary carcinoma, grade 2, e-cadherin positive. Prognostic indicators significant for: estrogen receptor, 95% positive with strong staining intensity, progesterone receptor, 0% negative. Proliferation marker Ki67 at 5%. HER2 equivocal by immunohistochemistry (2+), but negative by fluorescent in situ hybridization with a signals ratio 1.58 and number per cell 2.60.  The patient's subsequent history is as detailed below.   PAST MEDICAL HISTORY: Past Medical History:  Diagnosis Date  . Breast cancer, right ([place])   . Contact lens/glasses fitting    wears contacts or glasses  . Diarrhea    --post gallbladder surgery  . Family history of bone cancer   . Family history of brain cancer   . Family history of stomach cancer   . Family history of uterine cancer   . Fibroids   . History of radiation therapy   . Hypertension   .  No pertinent past medical history   . PONV (postoperative nausea and vomiting)   . Right ovarian cyst   . 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   PAST  SURGICAL HISTORY: Past Surgical History:  Procedure Laterality Date  . BLADDER SUSPENSION    . BREAST BIOPSY  09/08/2012   Procedure: BREAST BIOPSY;  Surgeon: Adolph Pollack, MD;  Location: [facility];  Service: General;  Laterality: Left;  remove left breast mass  . BREAST LUMPECTOMY WITH RADIOACTIVE SEED AND SENTINEL LYMPH NODE BIOPSY Right 10/04/2019   Procedure: RIGHT BREAST LUMPECTOMY WITH BRACKETED RADIOACTIVE SEEDS, RIGHT BREAST RADIOACTIVE SEED GUIDED EXCISION BIOPSY, AND RIGHT SENTINEL LYMPH NODE BIOPSY;  Surgeon: Almond Lint, MD;  Location: [facility];  Service: General;  Laterality: Right;  . BREAST SURGERY     lumpectomy x2  . BUNIONECTOMY Right 01/2016  . CHOLECYSTECTOMY    . COLONOSCOPY    . CYSTOSCOPY N/A 01/16/2020   Procedure: CYSTOSCOPY;  Surgeon: Patton Salles, MD;  Location: [facility];  Service: Gynecology;  Laterality: N/A;  . MASTOPEXY Right 10/04/2019   Procedure: RIGHT BREAST MASTOPEXY;  Surgeon: Almond Lint, MD;  Location: [facility];  Service: General;  Laterality: Right;  . RE-EXCISION OF BREAST LUMPECTOMY Right 10/31/2019   Procedure: RIGHT RE-EXCISION OF BREAST LUMPECTOMY;  Surgeon: Almond Lint, MD;  Location: [facility];  Service: General;  Laterality: Right;  . TOTAL LAPAROSCOPIC HYSTERECTOMY WITH BILATERAL SALPINGO OOPHORECTOMY N/A 01/16/2020   Procedure: TOTAL LAPAROSCOPIC HYSTERECTOMY WITH BILATERAL SALPINGO OOPHORECTOMY/COLLECTION OF PELVIC WASHINGS, LYSIS OF ADHESIONS;  Surgeon: Patton Salles, MD;  Location: [facility];  Service: Gynecology;  Laterality: N/A;  collection of pelvic washings  . TUBAL LIGATION      FAMILY HISTORY: Family History  Problem Relation Age of Onset  . Hypertension Father   . Stomach cancer Father        may have been colon, diagnosed in his 74s  . Depression Mother   . Dementia Mother   . Thyroid disease Sister    . Heart disease Sister   . Brain cancer Maternal Aunt 80  . Cancer Maternal Grandmother        undetermined type, diagnosed in her 25s  . Bone cancer Paternal Grandfather 66  . Uterine cancer Paternal Aunt 84  . Uterine cancer Cousin        paternal 1st cousin, diagnosed in her late 50s  . Uterine cancer Cousin 31       paternal 1st cousin   Patient's father was 73 years old when he died.  He had "stomach cancer", she thinks possibly colon.  Patient's mother died from complications from a fall at age 33. The patient denies a family hx of breast or ovarian cancer. She had no brothers, 2 sisters, 1 of whom died from sepsis.  1 grandmother, a former Education officer, environmental, died from what may have been lung cancer.  She also reports uterine cancer in a cousin and possibly an aunt.   GYNECOLOGIC HISTORY:  Patient's last menstrual period was 09/01/2007 (exact date). Menarche: 64 years old Age at first live birth: 64 years old (the patient has a retroverted uterus, likely explaining her difficulty in getting pregnant) GX P 1 LMP around age 19 Contraceptive used from 1987-1993, no complications HRT used for 5 years, 2013 until 01 /2018 , no complications Hysterectomy? yes BSO? yes Of note, she has a  history of postmenopausal bleeding, and is status post several benign cervical polyps and benign endometrial biopsies; she has a right ovarian cyst followed by Dr. Quincy Simmonds.  Most recent endometrial biopsy 07/06/2019 was benign, as was CA-125.   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 Her husband Coralyn Pear is a Librarian, academic for an Geophysicist/field seismologist. She lives at home with husband Daryl.  They have some horses.  Son Luisa Hart, age 14, is studying criminal justice at [employer] in [place].     ADVANCED DIRECTIVES: In the absence of any documentation to the contrary, the patient's spouse is their HCPOA.   HEALTH MAINTENANCE: Social History   Tobacco Use  . Smoking status:  Former Smoker    Types: Cigarettes    Quit date: 03/15/1996    Years since quitting: 23.9  . Smokeless tobacco: Never Used  Vaping Use  . Vaping Use: Never used  Substance Use Topics  . Alcohol use: Yes    Alcohol/week: 0.0 standard drinks    Comment: occ wine every 2 weeks  . Drug use: No     Colonoscopy: date unsure, possibly 2014  PAP: 06/2019, negative  Bone density: never done   Allergies  Allergen Reactions  . Flagyl [Metronidazole]     C-Diff  . Other     Dermabond - redness and burning, blistering  . Adhesive [Tape] Rash    "burning" skin    Current Outpatient Medications  Medication Sig Dispense Refill  . Ascorbic Acid (VITAMIN C) 100 MG tablet Take 100 mg by mouth daily.    . Cholecalciferol (VITAMIN D) 10 MCG/ML LIQD Take 1 drop by mouth in the morning and at bedtime.     Marland Kitchen CRANBERRY PO Take 1 tablet by mouth in the morning and at bedtime.     . hydrocortisone 2.5 % cream Apply topically 3 (three) times daily as needed. 28 g 3  . Lactobacillus Rhamnosus, GG, (CULTURELLE PO) Take 1 capsule by mouth daily.     Marland Kitchen loperamide (IMODIUM) 2 MG capsule Take 2 mg by mouth in the morning and at bedtime.     . Magnesium 100 MG TABS Take 100 mg by mouth daily.     . Melatonin 10 MG CAPS Take 10 mg by mouth at bedtime.     . meloxicam (MOBIC) 7.5 MG tablet Take 1 tablet by mouth daily.    . Pumpkin Seed-Soy Germ (AZO BLADDER CONTROL/GO-LESS PO) Take 1 tablet by mouth in the morning and at bedtime.     Marland Kitchen QUERCETIN PO Take 1 tablet by mouth in the morning and at bedtime.    . tamoxifen (NOLVADEX) 20 MG tablet Take 1 tablet (20 mg total) by mouth daily. 90 tablet 4  . TURMERIC PO Take 5 mLs by mouth daily.    . valsartan (DIOVAN) 80 MG tablet Take 80 mg by mouth at bedtime.    Marland Kitchen venlafaxine XR (EFFEXOR-XR) 37.5 MG 24 hr capsule Take 1 capsule (37.5 mg total) by mouth daily. 90 capsule 4  . vitamin E 180 MG (400 UNITS) capsule Take 400 Units by mouth daily.     No current  facility-administered medications for this visit.    OBJECTIVE:  white woman who appears younger than stated age  38:   03/08/20 1017  BP: 137/75  Pulse: 72  Resp: 20  Temp: 98.3 F (36.8 C)  SpO2: 100%     Body mass index is 25.23 kg/m.   Wt Readings  from Last 3 Encounters:  03/08/20 147 lb (66.7 kg)  02/27/20 148 lb (67.1 kg)  01/19/20 147 lb (66.7 kg)      ECOG FS:1 - Symptomatic but completely ambulatory  Sclerae unicteric, EOMs intact Wearing a mask No cervical or supraclavicular adenopathy Lungs no rales or rhonchi Heart regular rate and rhythm Abd soft, nontender, positive bowel sounds MSK no focal spinal tenderness, no upper extremity lymphedema Neuro: nonfocal, well oriented, appropriate affect Breasts: Status post right lumpectomy, with no evidence of local recurrence.  The cosmetic result is excellent.  There is some induration inferiorly which is expected and unremarkable.  Left breast is benign.  Both axillae are benign.   LAB RESULTS:  CMP     Component Value Date/Time   NA 137 03/08/2020 1001   K 5.0 03/08/2020 1001   CL 104 03/08/2020 1001   CO2 24 03/08/2020 1001   GLUCOSE 102 (H) 03/08/2020 1001   BUN 14 03/08/2020 1001   CREATININE 0.78 03/08/2020 1001   CALCIUM 9.2 03/08/2020 1001   PROT 6.6 03/08/2020 1001   ALBUMIN 3.9 03/08/2020 1001   AST 17 03/08/2020 1001   ALT 14 03/08/2020 1001   ALKPHOS 40 03/08/2020 1001   BILITOT 0.3 03/08/2020 1001   GFRNONAA >60 03/08/2020 1001   GFRAA >60 03/08/2020 1001    No results found for: TOTALPROTELP, ALBUMINELP, A1GS, A2GS, BETS, BETA2SER, GAMS, MSPIKE, SPEI  No results found for: KPAFRELGTCHN, LAMBDASER, KAPLAMBRATIO  Lab Results  Component Value Date   WBC 5.5 03/08/2020   NEUTROABS 3.0 03/08/2020   HGB 12.0 03/08/2020   HCT 36.9 03/08/2020   MCV 94.4 03/08/2020   PLT 178 03/08/2020    No results found for: LABCA2  No components found for: FKCLEX517  No results for input(s):  INR in the last 168 hours.  No results found for: LABCA2  No results found for: CAN199  Lab Results  Component Value Date   CAN125 7.8 07/06/2019    No results found for: GYF749  No results found for: CA2729  No components found for: HGQUANT  No results found for: CEA1 / No results found for: CEA1   No results found for: AFPTUMOR  No results found for: CHROMOGRNA  No results found for: HGBA, HGBA2QUANT, HGBFQUANT, HGBSQUAN (Hemoglobinopathy evaluation)   No results found for: LDH  No results found for: IRON, TIBC, IRONPCTSAT (Iron and TIBC)  No results found for: FERRITIN  Urinalysis    Component Value Date/Time   BILIRUBINUR n 05/15/2016 0902   PROTEINUR n 05/15/2016 0902   UROBILINOGEN negative 05/15/2016 0902   NITRITE n 05/15/2016 0902   LEUKOCYTESUR moderate (2+) (A) 05/15/2016 0902    STUDIES: No results found.   ELIGIBLE FOR AVAILABLE RESEARCH PROTOCOL: no  ASSESSMENT: 64 y.o. [place] woman status post right lower inner quadrant biopsy 07/03/2019 for a clinicaly multiple T1c N0, stage IA invasive ductal carcinoma, grade 2, E-cadherin positive, strongly estrogen receptor positive but progesterone receptor negative and HER-2 not amplified, with an MIB-1 of 5%  (a) breast MRI 08/07/2019 showed 2 additional suspicious areas in the right breast and 1 in the left breast   (b) biopsy of all 3 areas 08/21/2019 showed no malignancy  (1) tamoxifen started neoadjuvantly 07/26/2019  (2) right lumpectomy and sentinel lymph node sampling 10/04/2019 showed a pT2 pN1, stage IIB invasive ductal carcinoma, grade 2, with positive lymphovascular invasion and a positive superior margin  (a) 3 sentinel lymph nodes removed, one with macro metastatic deposit, 1 with  a micrometastatic deposit  (b) additional surgery for margin clearance   (3) MammaPrint on the 10/04/2019 sample shows a low risk luminal A tumor predicting a 5-year metastasis free survival of 96% without  chemotherapy, and a chemotherapy benefit of less than 1.5%  (4) adjuvant radiation 11/27/2019 through 01/05/2020 Site Technique Total Dose (Gy) Dose per Fx (Gy) Completed Fx Beam Energies  Breast, Right: Breast_Rt 3D 50/50 2 25/25 6X, 10X  Breast, Right: Breast_Rt_SCV_PAB 3D 50/50 2 25/25 6X, 10X  Breast, Right: Breast_Rt_Bst 3D 10/10 2 5/5 6X, 10X   (5) genetics testing 08/04/2019 through the STAT Breast cancer panel offered by [facility] found no deleterious mutations in ATM, BRCA1, BRCA2, CDH1, CHEK2, PALB2, PTEN, STK11 and TP53.  The Common Hereditary Cancers Panel offered by [facility] includes sequencing and/or deletion duplication testing of the following 48 genes: APC, ATM, AXIN2, BARD1, BMPR1A, BRCA1, BRCA2, BRIP1, CDH1, CDK4, CDKN2A (p14ARF), CDKN2A (p16INK4a), CHEK2, CTNNA1, DICER1, EPCAM (Deletion/duplication testing only), GREM1 (promoter region deletion/duplication testing only), KIT, MEN1, MLH1, MSH2, MSH3, MSH6, MUTYH, NBN, NF1, NHTL1, PALB2, PDGFRA, PMS2, POLD1, POLE, PTEN, RAD50, RAD51C, RAD51D, RNF43, SDHB, SDHC, SDHD, SMAD4, SMARCA4. STK11, TP53, TSC1, TSC2, and VHL.  The following genes were evaluated for sequence changes only: SDHA and HOXB13 c.251G>A variant only.   (6) tamoxifen started November 2020.  To be continued for 10 years  (a) status post hysterectomy and bilateral salpingo-oophorectomy 01/16/2020 with benign pathology    PLAN: Norell is now about 6 months out from definitive surgery for her breast cancer with no evidence of disease recurrence.  This is favorable.  She is tolerating tamoxifen well.  She is managing the hot flashes with very low dose venlafaxine and using a fan at night.  She did very well with her hysterectomy and hopefully she will be released to normal activity including horseback riding which is her passion later this month.  She will have a repeat mammography in October.  She will see Dr. Quincy Simmonds at that month and she will see Dr. Barry Dienes also in the  fall.  Accordingly she will see me again in April and from that point assuming all continues well I will start seeing her on a once a year basis.  Since she did not receive chemotherapy and did have nodal positivity I think it would be prudent for her to stay on tamoxifen a total of 10 years  Total encounter time 30 minutes.Chauncey Cruel, MD   03/08/2020 10:43 AM Medical Oncology and Hematology [facility] [place], [place] [address] Tel. [phone]    Fax. [phone]   This document serves as a record of services personally performed by Lurline Del, MD. It was created on his behalf by Wilburn Mylar, a trained medical scribe. The creation of this record is based on the scribe's personal observations and the provider's statements to them.   I, Lurline Del MD, have reviewed the above documentation for accuracy and completeness, and I agree with the above.   *Total Encounter Time as defined by the Centers for Medicare and Medicaid Services includes, in addition to the face-to-face time of a patient visit (documented in the note above) non-face-to-face time: obtaining and reviewing outside history, ordering and reviewing medications, tests or procedures, care coordination (communications with other health care professionals or caregivers) and documentation in the medical record.

## 2020-03-08 ENCOUNTER — Inpatient Hospital Stay: Payer: Commercial Managed Care - PPO | Attending: Oncology

## 2020-03-08 ENCOUNTER — Inpatient Hospital Stay (HOSPITAL_BASED_OUTPATIENT_CLINIC_OR_DEPARTMENT_OTHER): Payer: Commercial Managed Care - PPO | Admitting: Oncology

## 2020-03-08 ENCOUNTER — Telehealth: Payer: Self-pay | Admitting: Oncology

## 2020-03-08 ENCOUNTER — Other Ambulatory Visit: Payer: Self-pay

## 2020-03-08 VITALS — BP 137/75 | HR 72 | Temp 98.3°F | Resp 20 | Ht 64.0 in | Wt 147.0 lb

## 2020-03-08 DIAGNOSIS — Z79810 Long term (current) use of selective estrogen receptor modulators (SERMs): Secondary | ICD-10-CM | POA: Insufficient documentation

## 2020-03-08 DIAGNOSIS — Z791 Long term (current) use of non-steroidal anti-inflammatories (NSAID): Secondary | ICD-10-CM

## 2020-03-08 DIAGNOSIS — Z8049 Family history of malignant neoplasm of other genital organs: Secondary | ICD-10-CM | POA: Insufficient documentation

## 2020-03-08 DIAGNOSIS — Z800 Family history of malignant neoplasm of digestive organs: Secondary | ICD-10-CM | POA: Insufficient documentation

## 2020-03-08 DIAGNOSIS — Z8249 Family history of ischemic heart disease and other diseases of the circulatory system: Secondary | ICD-10-CM | POA: Diagnosis not present

## 2020-03-08 DIAGNOSIS — Z923 Personal history of irradiation: Secondary | ICD-10-CM | POA: Insufficient documentation

## 2020-03-08 DIAGNOSIS — Z79899 Other long term (current) drug therapy: Secondary | ICD-10-CM | POA: Diagnosis not present

## 2020-03-08 DIAGNOSIS — Z170 Estrogen receptor positive status [ER+]: Secondary | ICD-10-CM | POA: Diagnosis not present

## 2020-03-08 DIAGNOSIS — Z808 Family history of malignant neoplasm of other organs or systems: Secondary | ICD-10-CM | POA: Diagnosis not present

## 2020-03-08 DIAGNOSIS — I10 Essential (primary) hypertension: Secondary | ICD-10-CM | POA: Diagnosis not present

## 2020-03-08 DIAGNOSIS — Z90710 Acquired absence of both cervix and uterus: Secondary | ICD-10-CM | POA: Insufficient documentation

## 2020-03-08 DIAGNOSIS — Z9079 Acquired absence of other genital organ(s): Secondary | ICD-10-CM

## 2020-03-08 DIAGNOSIS — Z8349 Family history of other endocrine, nutritional and metabolic diseases: Secondary | ICD-10-CM | POA: Insufficient documentation

## 2020-03-08 DIAGNOSIS — Z90722 Acquired absence of ovaries, bilateral: Secondary | ICD-10-CM | POA: Diagnosis not present

## 2020-03-08 DIAGNOSIS — Z87891 Personal history of nicotine dependence: Secondary | ICD-10-CM

## 2020-03-08 DIAGNOSIS — C50311 Malignant neoplasm of lower-inner quadrant of right female breast: Secondary | ICD-10-CM

## 2020-03-08 LAB — CMP (CANCER CENTER ONLY)
ALT: 14 U/L (ref 0–44)
AST: 17 U/L (ref 15–41)
Albumin: 3.9 g/dL (ref 3.5–5.0)
Alkaline Phosphatase: 40 U/L (ref 38–126)
Anion gap: 9 (ref 5–15)
BUN: 14 mg/dL (ref 8–23)
CO2: 24 mmol/L (ref 22–32)
Calcium: 9.2 mg/dL (ref 8.9–10.3)
Chloride: 104 mmol/L (ref 98–111)
Creatinine: 0.78 mg/dL (ref 0.44–1.00)
GFR, Est AFR Am: >60 mL/min (ref >60)
GFR, Estimated: >60 mL/min (ref >60)
Glucose, Bld: 102 mg/dL — ABNORMAL HIGH (ref 70–99)
Potassium: 5 mmol/L (ref 3.5–5.1)
Sodium: 137 mmol/L (ref 135–145)
Total Bilirubin: 0.3 mg/dL (ref 0.3–1.2)
Total Protein: 6.6 g/dL (ref 6.5–8.1)

## 2020-03-08 LAB — CBC WITH DIFFERENTIAL (CANCER CENTER ONLY)
Abs Immature Granulocytes: 0.01 10*3/uL (ref 0.00–0.07)
Basophils Absolute: 0 10*3/uL (ref 0.0–0.1)
Basophils Relative: 1 %
Eosinophils Absolute: 0.1 10*3/uL (ref 0.0–0.5)
Eosinophils Relative: 2 %
HCT: 36.9 % (ref 36.0–46.0)
Hemoglobin: 12 g/dL (ref 12.0–15.0)
Immature Granulocytes: 0 %
Lymphocytes Relative: 31 %
Lymphs Abs: 1.7 10*3/uL (ref 0.7–4.0)
MCH: 30.7 pg (ref 26.0–34.0)
MCHC: 32.5 g/dL (ref 30.0–36.0)
MCV: 94.4 fL (ref 80.0–100.0)
Monocytes Absolute: 0.6 10*3/uL (ref 0.1–1.0)
Monocytes Relative: 11 %
Neutro Abs: 3 10*3/uL (ref 1.7–7.7)
Neutrophils Relative %: 55 %
Platelet Count: 178 10*3/uL (ref 150–400)
RBC: 3.91 MIL/uL (ref 3.87–5.11)
RDW: 12.8 % (ref 11.5–15.5)
WBC Count: 5.5 10*3/uL (ref 4.0–10.5)
nRBC: 0 % (ref 0.0–0.2)

## 2020-03-08 MED ORDER — TAMOXIFEN CITRATE 20 MG PO TABS: 20.0000 mg | ORAL_TABLET | Freq: Every day | ORAL | 4 refills | Status: DC

## 2020-03-08 MED ORDER — VENLAFAXINE HCL ER 37.5 MG PO CP24: 37.5000 mg | ORAL_CAPSULE | Freq: Every day | ORAL | 4 refills | Status: DC

## 2020-03-08 NOTE — Telephone Encounter (Signed)
Scheduled appts per 7/9 los. Gave pt a print out of appt calendar.

## 2020-03-12 ENCOUNTER — Ambulatory Visit: Payer: Commercial Managed Care - PPO | Admitting: Obstetrics and Gynecology

## 2020-03-18 ENCOUNTER — Telehealth: Payer: Self-pay | Admitting: Obstetrics and Gynecology

## 2020-03-18 NOTE — Telephone Encounter (Signed)
Encounter closed

## 2020-03-18 NOTE — Telephone Encounter (Signed)
[place] for appointment change.  You may close the encounter.

## 2020-03-18 NOTE — Telephone Encounter (Signed)
Patient would like to move post op visit up from 7/29.

## 2020-03-18 NOTE — Telephone Encounter (Signed)
Left message to call Sharee Pimple, RN at [facility].    01/16/20 S/P [facility] w/ BSO, LOA, Cysto

## 2020-03-18 NOTE — Telephone Encounter (Signed)
Spoke with patient. Patient is scheduled for 12 wk post op f/u on 7/29 at 4:30pm.  Patient reports she is doing well, no concerns post op. 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   Per review of 02/27/20, return in 4 wks, no exercise or lifting for 4 more weeks. No SA for 12 wks post op.   OV rescheduled to 7/27 at 1:30pm with Dr. Quincy Simmonds, advised this is wks from last OV. Will provide update to Dr. Quincy Simmonds and return call if any additional recommendations. Patient agreeable.   Routing to Dr. Quincy Simmonds for final review.

## 2020-03-18 NOTE — Telephone Encounter (Signed)
Patient is returning call. Patient states she would like to be called at home number [phone]

## 2020-03-25 NOTE — Progress Notes (Signed)
GYNECOLOGY  VISIT   HPI: 64 y.o.   Married  Caucasian  female   G1P1001 with Patient's last menstrual period was 09/01/2007 (exact date).   here for 10 weeks status post TOTAL LAPAROSCOPIC HYSTERECTOMY WITH BILATERAL SALPINGO OOPHORECTOMY/COLLECTION OF PELVIC WASHINGS, LYSIS OF ADHESIONS (N/A Abdomen) CYSTOSCOPY (N/A Bladder).  Doing 10,000 - 17,000 steps a day. Good energy level.  Anxious to get back to riding her horse.   She states her blood pressure is running low, so she is taking 1/2 Valsartan daily upon the advice of her PCP.   Wants triamcinolone cream or ointment for the vulva.  Having irritation.   Not itching.    GYNECOLOGIC HISTORY: Patient's last menstrual period was 09/01/2007 (exact date). Contraception: Hyst Menopausal hormone therapy: None Last mammogram: 08-07-19 MRI --see Epic Last pap smear:  06-23-19 Neg:Neg HR HPV,06-09-17 Neg:Neg HR HPV,05-02-15 Neg:Neg HRNeg                OB History    Gravida  1   Para  1   Term  1   Preterm      AB      Living  1     SAB      TAB      Ectopic      Multiple      Live Births                 Patient Active Problem List   Diagnosis Date Noted  . Status post laparoscopic hysterectomy 01/16/2020  . Genetic testing 08/04/2019  . Family history of uterine cancer   . Family history of stomach cancer   . Family history of bone cancer   . Family history of brain cancer   . Postmenopausal bleeding 07/09/2019  . Right ovarian cyst 07/09/2019  . Malignant neoplasm of lower-inner quadrant of right breast of female, estrogen receptor positive ([place]) 07/06/2019  . History of Clostridioides difficile infection 06/17/2018  . Fibroids 08/06/2017  . Breast mass in female-left at 12:00-benign 08/29/2012    Past Medical History:  Diagnosis Date  . Breast cancer, right ([place])   . Contact lens/glasses fitting    wears contacts or glasses  . Diarrhea    --post gallbladder surgery  . Family history of bone  cancer   . Family history of brain cancer   . Family history of stomach cancer   . Family history of uterine cancer   . Fibroids   . History of radiation therapy   . Hypertension   . No pertinent past medical history   . PONV (postoperative nausea and vomiting)   . Right ovarian cyst   . Vertigo     Past Surgical History:  Procedure Laterality Date  . BLADDER SUSPENSION    . BREAST BIOPSY  09/08/2012   Procedure: BREAST BIOPSY;  Surgeon: Odis Hollingshead, MD;  Location: [place];  Service: General;  Laterality: Left;  remove left breast mass  . BREAST LUMPECTOMY WITH RADIOACTIVE SEED AND SENTINEL LYMPH NODE BIOPSY Right 10/04/2019   Procedure: RIGHT BREAST LUMPECTOMY WITH BRACKETED RADIOACTIVE SEEDS, RIGHT BREAST RADIOACTIVE SEED GUIDED EXCISION BIOPSY, AND RIGHT SENTINEL LYMPH NODE BIOPSY;  Surgeon: Stark Klein, MD;  Location: [place];  Service: General;  Laterality: Right;  . BREAST SURGERY     lumpectomy x2  . BUNIONECTOMY Right 01/2016  . CHOLECYSTECTOMY    . COLONOSCOPY    . CYSTOSCOPY N/A 01/16/2020   Procedure: CYSTOSCOPY;  Surgeon:  Nunzio Cobbs, MD;  Location: [facility];  Service: Gynecology;  Laterality: N/A;  . MASTOPEXY Right 10/04/2019   Procedure: RIGHT BREAST MASTOPEXY;  Surgeon: Stark Klein, MD;  Location: [place];  Service: General;  Laterality: Right;  . RE-EXCISION OF BREAST LUMPECTOMY Right 10/31/2019   Procedure: RIGHT RE-EXCISION OF BREAST LUMPECTOMY;  Surgeon: Stark Klein, MD;  Location: [place];  Service: General;  Laterality: Right;  . TOTAL LAPAROSCOPIC HYSTERECTOMY WITH BILATERAL SALPINGO OOPHORECTOMY N/A 01/16/2020   Procedure: TOTAL LAPAROSCOPIC HYSTERECTOMY WITH BILATERAL SALPINGO OOPHORECTOMY/COLLECTION OF PELVIC WASHINGS, LYSIS OF ADHESIONS;  Surgeon: Nunzio Cobbs, MD;  Location: [facility];  Service: Gynecology;  Laterality:  N/A;  collection of pelvic washings  . TUBAL LIGATION      Current Outpatient Medications  Medication Sig Dispense Refill  . Ascorbic Acid (VITAMIN C) 100 MG tablet Take 100 mg by mouth daily.    . Cholecalciferol (VITAMIN D) 10 MCG/ML LIQD Take 1 drop by mouth in the morning and at bedtime.     Marland Kitchen CRANBERRY PO Take 1 tablet by mouth in the morning and at bedtime.     . hydrocortisone 2.5 % cream Apply topically 3 (three) times daily as needed. 28 g 3  . Lactobacillus Rhamnosus, GG, (CULTURELLE PO) Take 1 capsule by mouth daily.     Marland Kitchen loperamide (IMODIUM) 2 MG capsule Take 2 mg by mouth in the morning and at bedtime.     . Magnesium 100 MG TABS Take 100 mg by mouth daily.     . Melatonin 10 MG CAPS Take 10 mg by mouth at bedtime.     . meloxicam (MOBIC) 7.5 MG tablet Take 1 tablet by mouth daily.    . Pumpkin Seed-Soy Germ (AZO BLADDER CONTROL/GO-LESS PO) Take 1 tablet by mouth in the morning and at bedtime.     Marland Kitchen QUERCETIN PO Take 1 tablet by mouth in the morning and at bedtime.    . tamoxifen (NOLVADEX) 20 MG tablet Take 1 tablet (20 mg total) by mouth daily. 90 tablet 4  . TURMERIC PO Take 5 mLs by mouth daily.    . valsartan (DIOVAN) 80 MG tablet Take 80 mg by mouth at bedtime.    Marland Kitchen venlafaxine XR (EFFEXOR-XR) 37.5 MG 24 hr capsule Take 1 capsule (37.5 mg total) by mouth daily. 90 capsule 4  . vitamin E 180 MG (400 UNITS) capsule Take 400 Units by mouth daily.     No current facility-administered medications for this visit.     ALLERGIES: Flagyl [metronidazole], Other, and Adhesive [tape]  Family History  Problem Relation Age of Onset  . Hypertension Father   . Stomach cancer Father        may have been colon, diagnosed in his 30s  . Depression Mother   . Dementia Mother   . Thyroid disease Sister   . Heart disease Sister   . Brain cancer Maternal Aunt 80  . Cancer Maternal Grandmother        undetermined type, diagnosed in her 57s  . Bone cancer Paternal Grandfather 55  .  Uterine cancer Paternal Aunt 31  . Uterine cancer Cousin        paternal 1st cousin, diagnosed in her late 72s  . Uterine cancer Cousin 41       paternal 1st cousin    Social History   Socioeconomic History  . Marital status: Married  Spouse name: Daryl   . Number of children: Not on file  . Years of education: Not on file  . Highest education level: Not on file  Occupational History  . Not on file  Tobacco Use  . Smoking status: Former Smoker    Types: Cigarettes    Quit date: 03/15/1996    Years since quitting: 24.0  . Smokeless tobacco: Never Used  Vaping Use  . Vaping Use: Never used  Substance and Sexual Activity  . Alcohol use: Yes    Alcohol/week: 0.0 standard drinks    Comment: occ wine every 2 weeks  . Drug use: No  . Sexual activity: Not Currently    Partners: Male    Birth control/protection: Post-menopausal, Surgical    Comment: BTL  Other Topics Concern  . Not on file  Social History Narrative  . Not on file   Social Determinants of Health   Financial Resource Strain:   . Difficulty of Paying Living Expenses:   Food Insecurity:   . Worried About Charity fundraiser in the Last Year:   . Arboriculturist in the Last Year:   Transportation Needs:   . Film/video editor (Medical):   Marland Kitchen Lack of Transportation (Non-Medical):   Physical Activity:   . Days of Exercise per Week:   . Minutes of Exercise per Session:   Stress:   . Feeling of Stress :   Social Connections:   . Frequency of Communication with Friends and Family:   . Frequency of Social Gatherings with Friends and Family:   . Attends Religious Services:   . Active Member of Clubs or Organizations:   . Attends Archivist Meetings:   Marland Kitchen Marital Status:   Intimate Partner Violence:   . Fear of Current or Ex-Partner:   . Emotionally Abused:   Marland Kitchen Physically Abused:   . Sexually Abused:     Review of Systems  All other systems reviewed and are negative.   PHYSICAL  EXAMINATION:    BP 112/74   Pulse 60   Ht 5\' 4"  (1.626 m)   Wt 149 lb 6.4 oz (67.8 kg)   LMP 09/01/2007 (Exact Date)   BMI 25.64 kg/m     General appearance: alert, cooperative and appears stated age   Pelvic: External genitalia:  no lesions              Urethra:  normal appearing urethra with no masses, tenderness or lesions              Bartholins and Skenes: normal                 Vagina: normal appearing vagina with normal color and discharge, no lesions              Cervix: absent.  Cuff healing.  Suture visible.                Bimanual Exam:  Uterus:  Absent.  Suture palpable across the vaginal cuff.               Adnexa: no mass, fullness, tenderness            Chaperone was present for exam.  ASSESSMENT  Status post laparoscopic hysterectomy with BSO.  Vaginal cuff healing.   PLAN  Continue decreased activity.  FU for final post op visit around April 17, 2020.

## 2020-03-26 ENCOUNTER — Ambulatory Visit (INDEPENDENT_AMBULATORY_CARE_PROVIDER_SITE_OTHER): Payer: Commercial Managed Care - PPO | Admitting: Obstetrics and Gynecology

## 2020-03-26 ENCOUNTER — Encounter: Payer: Self-pay | Admitting: Obstetrics and Gynecology

## 2020-03-26 ENCOUNTER — Other Ambulatory Visit: Payer: Self-pay

## 2020-03-26 VITALS — BP 112/74 | HR 60 | Ht 64.0 in | Wt 149.4 lb

## 2020-03-26 DIAGNOSIS — Z9889 Other specified postprocedural states: Secondary | ICD-10-CM

## 2020-03-26 MED ORDER — TRIAMCINOLONE ACETONIDE 0.025 % EX OINT
1.0000 | TOPICAL_OINTMENT | Freq: Two times a day (BID) | CUTANEOUS | 0 refills | Status: DC
Start: 2020-03-26 — End: 2021-07-01

## 2020-03-28 ENCOUNTER — Ambulatory Visit: Payer: Commercial Managed Care - PPO | Admitting: Obstetrics and Gynecology

## 2020-04-10 NOTE — Progress Notes (Signed)
GYNECOLOGY  VISIT   HPI: 64 y.o.   Married  Caucasian  female   G1P1001 with Patient's last menstrual period was 09/01/2007 (exact date).   here for 12  weeks status post TOTAL LAPAROSCOPIC HYSTERECTOMY WITH BILATERAL SALPINGO OOPHORECTOMY/COLLECTION OF PELVIC WASHINGS, LYSIS OF ADHESIONS (N/A Abdomen) CYSTOSCOPY (N/A Bladder).  Feeling fine.  Working at the barn.  Wants to start riding again.   Saw Dr. Jana Hakim in July. She is due for mammogram in October.    GYNECOLOGIC HISTORY: Patient's last menstrual period was 09/01/2007 (exact date). Contraception:  Hysterectomy  Menopausal hormone therapy:  None  Last mammogram:   08-07-19 MRI --see Epic Last pap smear:  06-23-19 Neg:Neg HR HPV,06-09-17 Neg:Neg HR HPV,05-02-15 Neg:Neg HRNeg         OB History    Gravida  1   Para  1   Term  1   Preterm      AB      Living  1     SAB      TAB      Ectopic      Multiple      Live Births                 Patient Active Problem List   Diagnosis Date Noted   Status post laparoscopic hysterectomy 01/16/2020   Genetic testing 08/04/2019   Family history of uterine cancer    Family history of stomach cancer    Family history of bone cancer    Family history of brain cancer    Postmenopausal bleeding 07/09/2019   Right ovarian cyst 07/09/2019   Malignant neoplasm of lower-inner quadrant of right breast of female, estrogen receptor positive ([place]) 07/06/2019   History of Clostridioides difficile infection 06/17/2018   Fibroids 08/06/2017   Breast mass in female-left at 12:00-benign 08/29/2012    Past Medical History:  Diagnosis Date   Breast cancer, right ([place])    Contact lens/glasses fitting    wears contacts or glasses   Diarrhea    --post gallbladder surgery   Family history of bone cancer    Family history of brain cancer    Family history of stomach cancer    Family history of uterine cancer    Fibroids    History of radiation therapy     Hypertension    No pertinent past medical history    PONV (postoperative nausea and vomiting)    Right ovarian cyst    Vertigo     Past Surgical History:  Procedure Laterality Date   BLADDER SUSPENSION     BREAST BIOPSY  09/08/2012   Procedure: BREAST BIOPSY;  Surgeon: Odis Hollingshead, MD;  Location: [place];  Service: General;  Laterality: Left;  remove left breast mass   BREAST LUMPECTOMY WITH RADIOACTIVE SEED AND SENTINEL LYMPH NODE BIOPSY Right 10/04/2019   Procedure: RIGHT BREAST LUMPECTOMY WITH BRACKETED RADIOACTIVE SEEDS, RIGHT BREAST RADIOACTIVE SEED GUIDED EXCISION BIOPSY, AND RIGHT SENTINEL LYMPH NODE BIOPSY;  Surgeon: Stark Klein, MD;  Location: [place];  Service: General;  Laterality: Right;   BREAST SURGERY     lumpectomy x2   BUNIONECTOMY Right 01/2016   CHOLECYSTECTOMY     COLONOSCOPY     CYSTOSCOPY N/A 01/16/2020   Procedure: CYSTOSCOPY;  Surgeon: Nunzio Cobbs, MD;  Location[facility];  Service: Gynecology;  Laterality: N/A;   MASTOPEXY Right 10/04/2019   Procedure: RIGHT BREAST MASTOPEXY;  Surgeon: Barry Dienes,  Dorris Fetch, MD;  Location: [place];  Service: General;  Laterality: Right;   RE-EXCISION OF BREAST LUMPECTOMY Right 10/31/2019   Procedure: RIGHT RE-EXCISION OF BREAST LUMPECTOMY;  Surgeon: Stark Klein, MD;  Location: [place];  Service: General;  Laterality: Right;   TOTAL LAPAROSCOPIC HYSTERECTOMY WITH BILATERAL SALPINGO OOPHORECTOMY N/A 01/16/2020   Procedure: TOTAL LAPAROSCOPIC HYSTERECTOMY WITH BILATERAL SALPINGO OOPHORECTOMY/COLLECTION OF PELVIC WASHINGS, LYSIS OF ADHESIONS;  Surgeon: Nunzio Cobbs, MD;  Location: [facility];  Service: Gynecology;  Laterality: N/A;  collection of pelvic washings   TUBAL LIGATION      Current Outpatient Medications  Medication Sig Dispense Refill   Ascorbic Acid (VITAMIN C) 100 MG tablet  Take 100 mg by mouth daily.     Cholecalciferol (VITAMIN D) 10 MCG/ML LIQD Take 1 drop by mouth in the morning and at bedtime.      CRANBERRY PO Take 1 tablet by mouth in the morning and at bedtime.      hydrocortisone 2.5 % cream Apply topically 3 (three) times daily as needed. 28 g 3   Lactobacillus Rhamnosus, GG, (CULTURELLE PO) Take 1 capsule by mouth daily.      loperamide (IMODIUM) 2 MG capsule Take 2 mg by mouth in the morning and at bedtime.      Magnesium 100 MG TABS Take 100 mg by mouth daily.      Melatonin 10 MG CAPS Take 10 mg by mouth at bedtime.      Pumpkin Seed-Soy Germ (AZO BLADDER CONTROL/GO-LESS PO) Take 1 tablet by mouth in the morning and at bedtime.      QUERCETIN PO Take 1 tablet by mouth in the morning and at bedtime.     tamoxifen (NOLVADEX) 20 MG tablet Take 1 tablet (20 mg total) by mouth daily. 90 tablet 4   triamcinolone (KENALOG) 0.025 % ointment Apply 1 application topically 2 (two) times daily. 30 g 0   TURMERIC PO Take 5 mLs by mouth daily.     valsartan (DIOVAN) 80 MG tablet Take 80 mg by mouth at bedtime.     venlafaxine XR (EFFEXOR-XR) 37.5 MG 24 hr capsule Take 1 capsule (37.5 mg total) by mouth daily. 90 capsule 4   vitamin E 180 MG (400 UNITS) capsule Take 400 Units by mouth daily.     No current facility-administered medications for this visit.     ALLERGIES: Flagyl [metronidazole], Other, and Adhesive [tape]  Family History  Problem Relation Age of Onset   Hypertension Father    Stomach cancer Father        may have been colon, diagnosed in his 70s   Depression Mother    Dementia Mother    Thyroid disease Sister    Heart disease Sister    Brain cancer Maternal Aunt 54   Cancer Maternal Grandmother        undetermined type, diagnosed in her 29s   Bone cancer Paternal Grandfather 30   Uterine cancer Paternal Aunt 72   Uterine cancer Cousin        paternal 1st cousin, diagnosed in her late 46s   Uterine cancer  Cousin 26       paternal 1st cousin    Social History   Socioeconomic History   Marital status: Married    Spouse name: Daryl    Number of children: Not on file   Years of education: Not on file   Highest education level: Not on file  Occupational History   Not on file  Tobacco Use   Smoking status: Former Smoker    Types: Cigarettes    Quit date: 03/15/1996    Years since quitting: 24.1   Smokeless tobacco: Never Used  Vaping Use   Vaping Use: Never used  Substance and Sexual Activity   Alcohol use: Yes    Alcohol/week: 0.0 standard drinks    Comment: occ wine every 2 weeks   Drug use: No   Sexual activity: Not Currently    Partners: Male    Birth control/protection: Post-menopausal, Surgical    Comment: BTL  Other Topics Concern   Not on file  Social History Narrative   Not on file   Social Determinants of Health   Financial Resource Strain:    Difficulty of Paying Living Expenses:   Food Insecurity:    Worried About Charity fundraiser in the Last Year:    Arboriculturist in the Last Year:   Transportation Needs:    Film/video editor (Medical):    Lack of Transportation (Non-Medical):   Physical Activity:    Days of Exercise per Week:    Minutes of Exercise per Session:   Stress:    Feeling of Stress :   Social Connections:    Frequency of Communication with Friends and Family:    Frequency of Social Gatherings with Friends and Family:    Attends Religious Services:    Active Member of Clubs or Organizations:    Attends Music therapist:    Marital Status:   Intimate Partner Violence:    Fear of Current or Ex-Partner:    Emotionally Abused:    Physically Abused:    Sexually Abused:     Review of Systems  Constitutional: Positive for fatigue.  All other systems reviewed and are negative.   PHYSICAL EXAMINATION:    BP 140/72    Pulse 70    Ht 5\' 4"  (1.626 m)    Wt 149 lb 9.6 oz (67.9 kg)    LMP  09/01/2007 (Exact Date)    BMI 25.68 kg/m     General appearance: alert, cooperative and appears stated age    Pelvic: External genitalia:  no lesions              Urethra:  normal appearing urethra with no masses, tenderness or lesions              Bartholins and Skenes: normal                 Vagina: normal appearing vagina with normal color and discharge, no lesions              Cervix: absent.  Small pieces of V Lock suture still present.  Midline pieces removed with a Q-tip and a piece remains on each side of the vagina.                 Bimanual Exam:  Uterus:  absent              Adnexa: no mass, fullness, tenderness         Chaperone was present for exam.  ASSESSMENT  Doing well post op 12 weeks from laparoscopic hysterectomy.   PLAN  Ok to start riding horses.  No sexual activity for 1 - 2 weeks.  Fu for annual exam and prn.

## 2020-04-16 ENCOUNTER — Ambulatory Visit (INDEPENDENT_AMBULATORY_CARE_PROVIDER_SITE_OTHER): Payer: Commercial Managed Care - PPO | Admitting: Obstetrics and Gynecology

## 2020-04-16 ENCOUNTER — Other Ambulatory Visit: Payer: Self-pay

## 2020-04-16 ENCOUNTER — Encounter: Payer: Self-pay | Admitting: Obstetrics and Gynecology

## 2020-04-16 VITALS — BP 140/72 | HR 70 | Ht 64.0 in | Wt 149.6 lb

## 2020-04-16 DIAGNOSIS — Z9889 Other specified postprocedural states: Secondary | ICD-10-CM

## 2020-05-16 ENCOUNTER — Telehealth: Payer: Self-pay | Admitting: *Deleted

## 2020-05-16 NOTE — Telephone Encounter (Signed)
Returned call from a voicemail I received about when she will need a mammogram.  Left message for a return call back.

## 2020-06-17 ENCOUNTER — Other Ambulatory Visit: Payer: Self-pay

## 2020-06-17 ENCOUNTER — Ambulatory Visit
Admission: RE | Admit: 2020-06-17 | Discharge: 2020-06-17 | Disposition: A | Discharge status: Home / Self Care | Payer: Commercial Managed Care - PPO | Source: Ambulatory Visit | Attending: Oncology | Admitting: Oncology

## 2020-06-17 DIAGNOSIS — Z90710 Acquired absence of both cervix and uterus: Secondary | ICD-10-CM

## 2020-06-17 DIAGNOSIS — Z170 Estrogen receptor positive status [ER+]: Secondary | ICD-10-CM

## 2020-06-17 DIAGNOSIS — C50311 Malignant neoplasm of lower-inner quadrant of right female breast: Secondary | ICD-10-CM

## 2020-06-17 HISTORY — DX: Personal history of irradiation: Z92.3

## 2020-06-17 IMAGING — MG DIGITAL DIAGNOSTIC BILAT W/ TOMO W/ CAD
9 series · 9 of 25 positions shown · non-contrast
Comparison: Previous exam(s).

CLINICAL DATA: 64-year-old female for annual follow-up. History of
RIGHT breast cancer and lumpectomy with radiation in [QN].

EXAM:
DIGITAL DIAGNOSTIC BILATERAL MAMMOGRAM WITH CAD AND TOMO

[R CC]
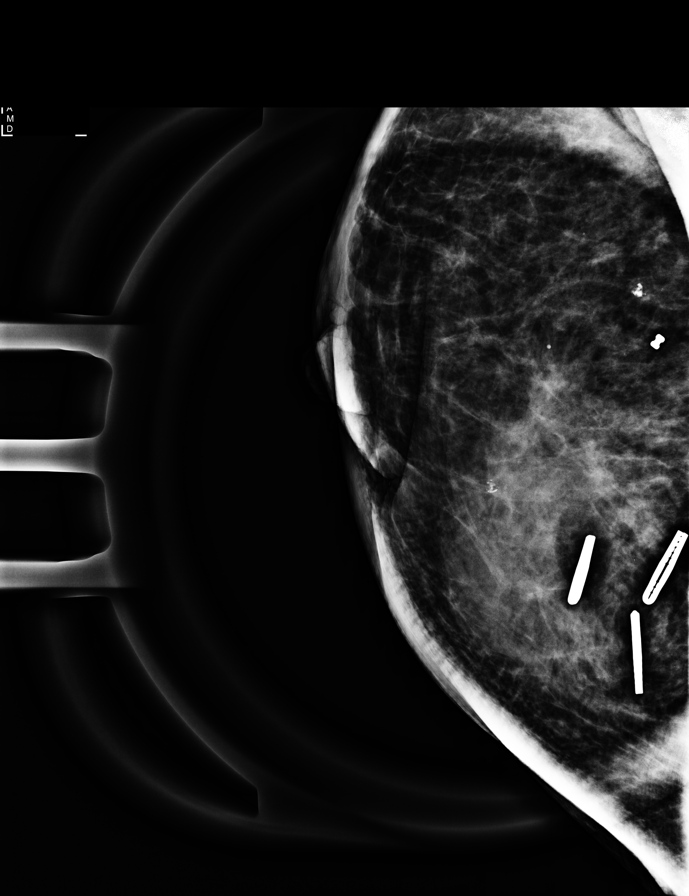

[L CC synth-2D]
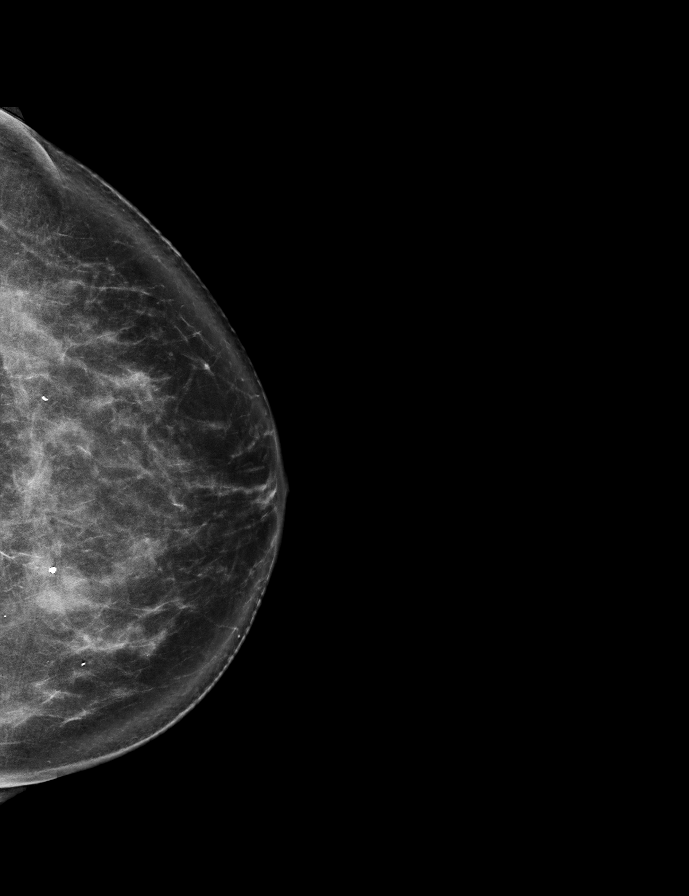

[R MLO synth-2D]
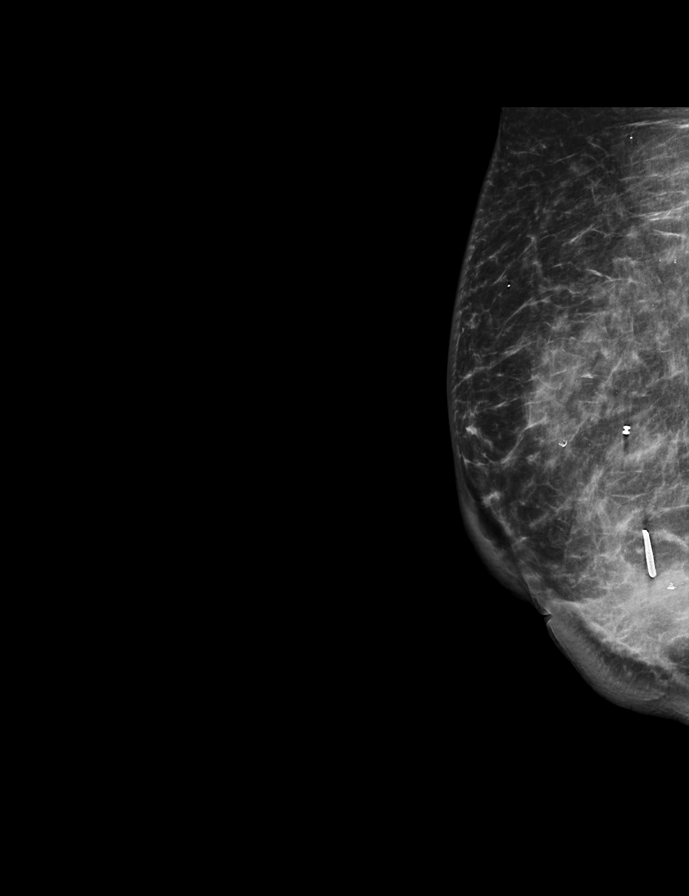

[L MLO synth-2D]
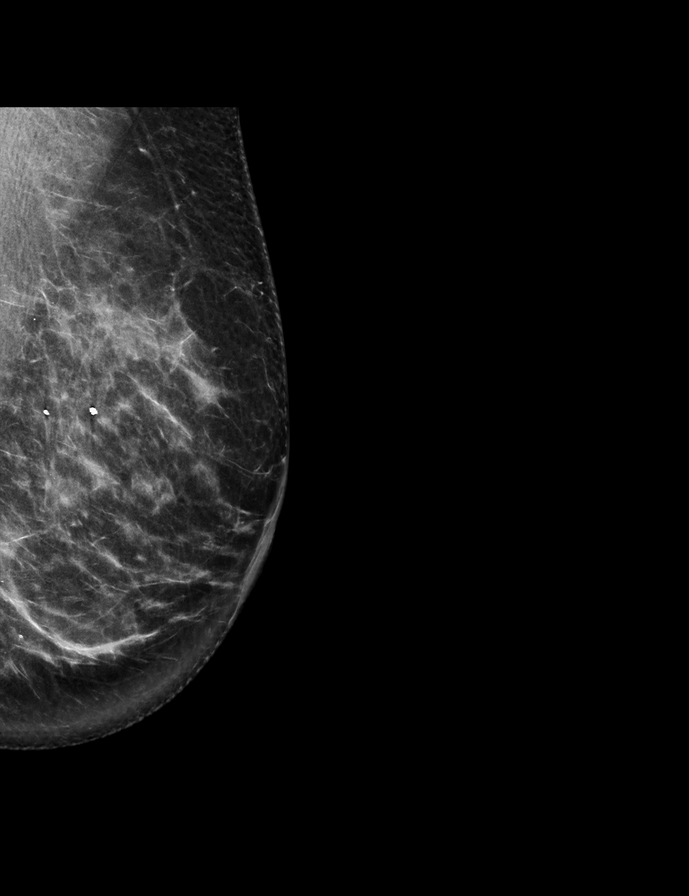

[R CC synth-2D]
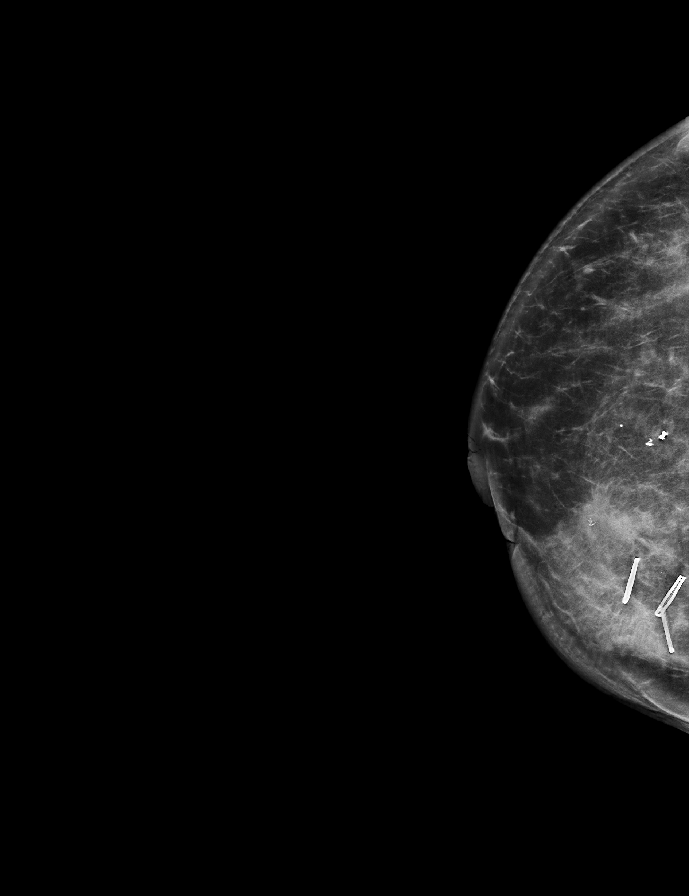

[L CC tomo · tomo slice 37/74.0]
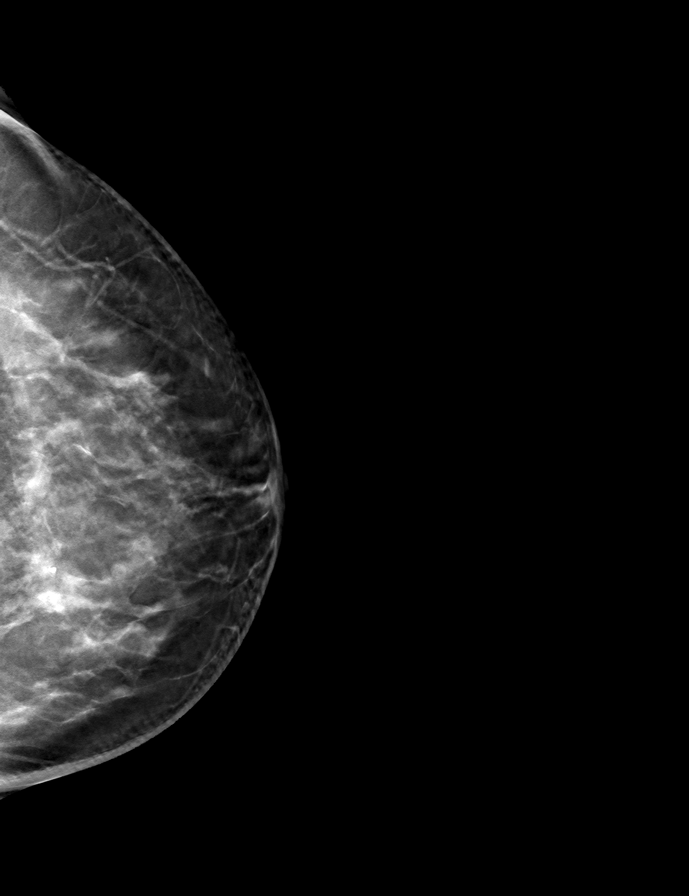

[L MLO tomo · tomo slice 35/70.0]
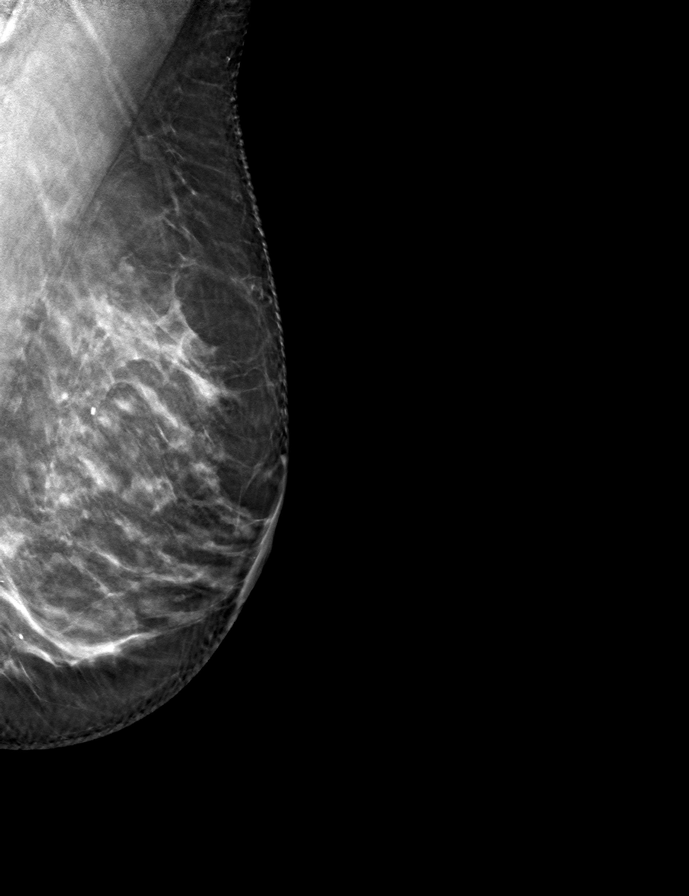

[R CC tomo · tomo slice 39/78.0]
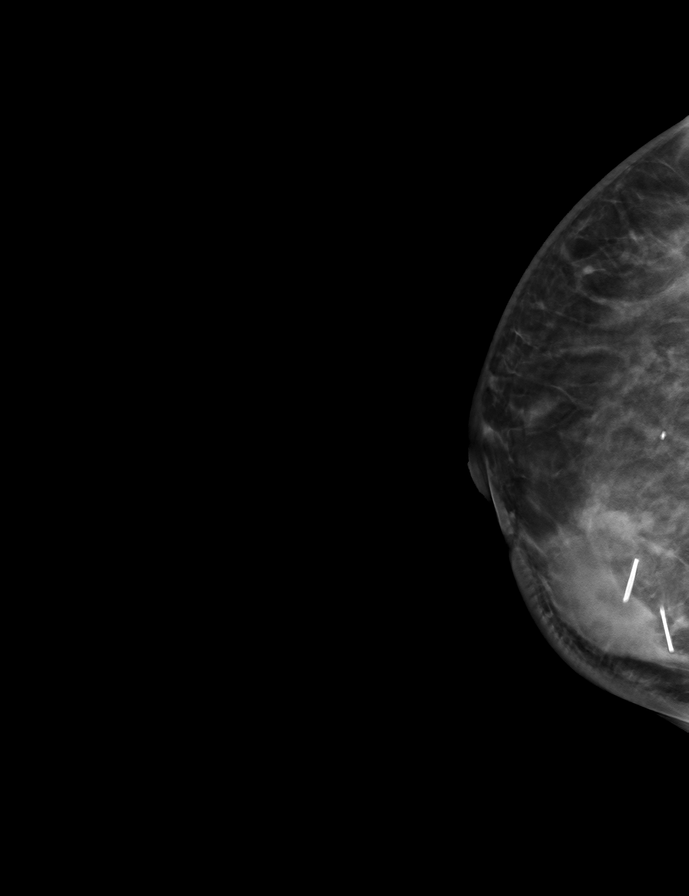

[R MLO tomo · tomo slice 38/75.0]
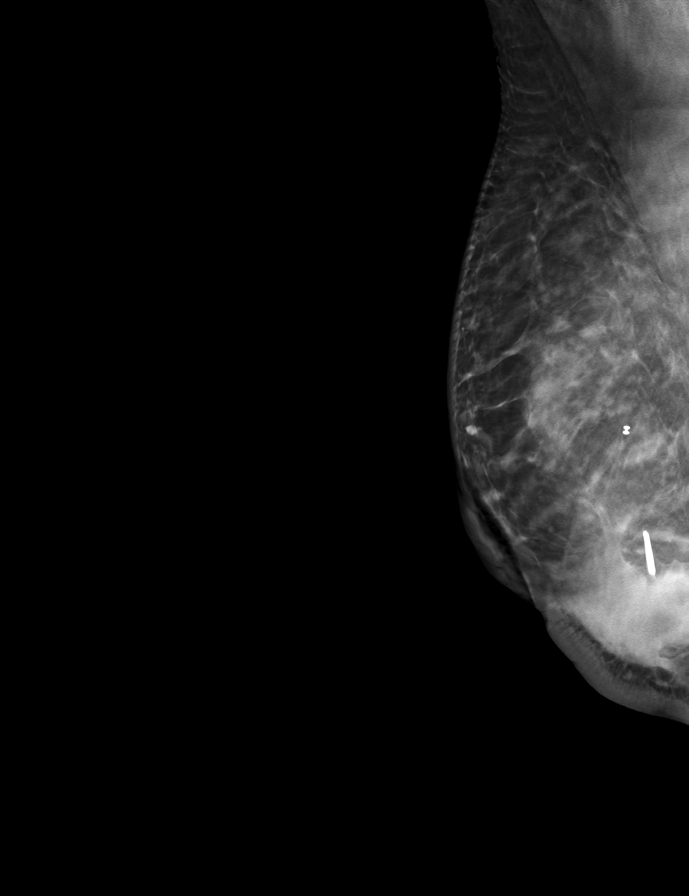

[9 of 25 positions shown; findings below may reference images not displayed]

ACR Breast Density Category c: The breast tissue is heterogeneously
dense, which may obscure small masses.
FINDINGS: 2D and 3D full field views of both breasts and a magnification view
of the lumpectomy site demonstrate no suspicious mass, nonsurgical
distortion or worrisome calcifications.

RIGHT lumpectomy changes are noted.

Biopsy clips within both breasts at site of benign biopsies are
present.

Mammographic images were processed with CAD.
IMPRESSION: No evidence of breast malignancy.

RECOMMENDATION:
Bilateral diagnostic mammogram in 1 year.

I have discussed the findings and recommendations with the patient.
If applicable, a reminder letter will be sent to the patient
regarding the next appointment.

BI-RADS CATEGORY  2: Benign.

## 2020-06-25 NOTE — Progress Notes (Signed)
64 y.o. G43P1001 Married Caucasian female here for annual exam.    Doing well on Tamoxifen.  Patient saw urologist 2 weeks ago for UTI and was given antibiotic to take with intercourse. Has Replens.   Some vaginal discharge, but no itching, burning, or odor.  Riding her horse and showing again.  PCP:  [name], MD   Patient's last menstrual period was 09/01/2007 (exact date).           Sexually active: Yes.    The current method of family planning is tubal ligation/Hyst.    Exercising: Yes.    walking and riding horses Smoker: Former  Health Maintenance: Pap:  06-23-19 Neg:Neg HR HPV, 06-09-17 Neg:Neg HR HPV, 05-02-15 Neg:Neg HR HPV History of abnormal Pap:  no MMG: 06-17-20 Hx Rt.Br.ca/Diag.Bil./Neg/density C/Diag.Bil.24yr/BiRads2 Colonoscopy: 2016 normal per pt., 05/2021 polyps  BMD:   n/a  Result  n/a TDaP:  PCP Gardasil:   no HIV: Unsure Hep C:Unsure Screening Labs:  PCP.    reports that she quit smoking about 24 years ago. Her smoking use included cigarettes. She has never used smokeless tobacco. She reports current alcohol use. She reports that she does not use drugs.  Past Medical History:  Diagnosis Date  . Breast cancer, right ([place])   . Contact lens/glasses fitting    wears contacts or glasses  . Diarrhea    --post gallbladder surgery  . Family history of bone cancer   . Family history of brain cancer   . Family history of stomach cancer   . Family history of uterine cancer   . Fibroids   . History of radiation therapy   . Hypertension   . No pertinent past medical history   . Personal history of radiation therapy   . PONV (postoperative nausea and vomiting)   . Right ovarian cyst   . Vertigo     Past Surgical History:  Procedure Laterality Date  . BLADDER SUSPENSION    . BREAST BIOPSY  09/08/2012   Procedure: BREAST BIOPSY;  Surgeon: Odis Hollingshead, MD;  Location: [facility];  Service: General;  Laterality: Left;  remove left breast  mass  . BREAST LUMPECTOMY Right 10/2019  . BREAST LUMPECTOMY WITH RADIOACTIVE SEED AND SENTINEL LYMPH NODE BIOPSY Right 10/04/2019   Procedure: RIGHT BREAST LUMPECTOMY WITH BRACKETED RADIOACTIVE SEEDS, RIGHT BREAST RADIOACTIVE SEED GUIDED EXCISION BIOPSY, AND RIGHT SENTINEL LYMPH NODE BIOPSY;  Surgeon: Stark Klein, MD;  Location: [facility];  Service: General;  Laterality: Right;  . BREAST SURGERY     lumpectomy x2  . BUNIONECTOMY Right 01/2016  . CHOLECYSTECTOMY    . COLONOSCOPY    . CYSTOSCOPY N/A 01/16/2020   Procedure: CYSTOSCOPY;  Surgeon: Nunzio Cobbs, MD;  Location: [facility];  Service: Gynecology;  Laterality: N/A;  . MASTOPEXY Right 10/04/2019   Procedure: RIGHT BREAST MASTOPEXY;  Surgeon: Stark Klein, MD;  Location: [facility];  Service: General;  Laterality: Right;  . RE-EXCISION OF BREAST LUMPECTOMY Right 10/31/2019   Procedure: RIGHT RE-EXCISION OF BREAST LUMPECTOMY;  Surgeon: Stark Klein, MD;  Location: [facility];  Service: General;  Laterality: Right;  . TOTAL LAPAROSCOPIC HYSTERECTOMY WITH BILATERAL SALPINGO OOPHORECTOMY N/A 01/16/2020   Procedure: TOTAL LAPAROSCOPIC HYSTERECTOMY WITH BILATERAL SALPINGO OOPHORECTOMY/COLLECTION OF PELVIC WASHINGS, LYSIS OF ADHESIONS;  Surgeon: Nunzio Cobbs, MD;  Location: [facility];  Service: Gynecology;  Laterality: N/A;  collection of pelvic washings  .  TUBAL LIGATION      Current Outpatient Medications  Medication Sig Dispense Refill  . Ascorbic Acid (VITAMIN C) 100 MG tablet Take 100 mg by mouth daily.    . Cholecalciferol (VITAMIN D) 10 MCG/ML LIQD Take 1 drop by mouth in the morning and at bedtime.     Marland Kitchen CRANBERRY PO Take 1 tablet by mouth in the morning and at bedtime.     . hydrocortisone 2.5 % cream Apply topically 3 (three) times daily as needed. 28 g 3  . Lactobacillus Rhamnosus, GG, (CULTURELLE PO) Take 1 capsule by mouth daily.      Marland Kitchen loperamide (IMODIUM) 2 MG capsule Take 2 mg by mouth in the morning and at bedtime.     . Magnesium 100 MG TABS Take 100 mg by mouth daily.     . Melatonin 10 MG CAPS Take 10 mg by mouth at bedtime.     . nitrofurantoin (MACRODANTIN) 50 MG capsule Take 50 mg by mouth daily.    . Pumpkin Seed-Soy Germ (AZO BLADDER CONTROL/GO-LESS PO) Take 1 tablet by mouth in the morning and at bedtime.     Marland Kitchen QUERCETIN PO Take 1 tablet by mouth in the morning and at bedtime.    . tamoxifen (NOLVADEX) 20 MG tablet Take 1 tablet (20 mg total) by mouth daily. 90 tablet 4  . triamcinolone (KENALOG) 0.025 % ointment Apply 1 application topically 2 (two) times daily. 30 g 0  . TURMERIC PO Take 5 mLs by mouth daily.    . valsartan (DIOVAN) 40 MG tablet Take 40 mg by mouth at bedtime.    Marland Kitchen venlafaxine XR (EFFEXOR-XR) 37.5 MG 24 hr capsule Take 1 capsule (37.5 mg total) by mouth daily. 90 capsule 4  . vitamin E 180 MG (400 UNITS) capsule Take 400 Units by mouth daily.     No current facility-administered medications for this visit.    Family History  Problem Relation Age of Onset  . Hypertension Father   . Stomach cancer Father        may have been colon, diagnosed in his 49s  . Depression Mother   . Dementia Mother   . Thyroid disease Sister   . Heart disease Sister   . Brain cancer Maternal Aunt 80  . Cancer Maternal Grandmother        undetermined type, diagnosed in her 91s  . Bone cancer Paternal Grandfather 26  . Uterine cancer Paternal Aunt 63  . Uterine cancer Cousin        paternal 1st cousin, diagnosed in her late 49s  . Uterine cancer Cousin 41       paternal 1st cousin    Review of Systems  All other systems reviewed and are negative.   Exam:   BP (!) 144/76   Pulse 84   Ht 5\' 4"  (1.626 m)   Wt 150 lb (68 kg)   LMP 09/01/2007 (Exact Date)   SpO2 98%   BMI 25.75 kg/m     General appearance: alert, cooperative and appears stated age Head: normocephalic, without obvious  abnormality, atraumatic Neck: no adenopathy, supple, symmetrical, trachea midline and thyroid normal to inspection and palpation Lungs: clear to auscultation bilaterally Breasts: normal appearance, no masses or tenderness, No nipple retraction or dimpling, No nipple discharge or bleeding, No axillary adenopathy Heart: regular rate and rhythm Abdomen: soft, non-tender; no masses, no organomegaly Extremities: extremities normal, atraumatic, no cyanosis or edema Skin: skin color, texture, turgor normal. No rashes or  lesions Lymph nodes: cervical, supraclavicular, and axillary nodes normal. Neurologic: grossly normal  Pelvic: External genitalia:  no lesions              No abnormal inguinal nodes palpated.              Urethra:  normal appearing urethra with no masses, tenderness or lesions              Bartholins and Skenes: normal                 Vagina: normal appearing vagina with normal color and discharge, no lesions              Cervix: absent              Pap taken: No. Bimanual Exam:  Uterus:  absent              Adnexa: no mass, fullness, tenderness              Rectal exam: Yes.  .  Confirms.              Anus:  normal sphincter tone, no lesions  Chaperone was present for exam.  Assessment:   Well woman visit with normal exam. Status post total laparoscopic hysterectomy with BSO.   Has healed well. Right breast cancer.  On Tamoxifen.  Plan: Mammogram screening discussed. Self breast awareness reviewed. Pap and HR HPV as above. Guidelines for Calcium, Vitamin D, regular exercise program including cardiovascular and weight bearing exercise. BMD next year.  Follow up annually and prn.

## 2020-06-27 ENCOUNTER — Encounter: Payer: Self-pay | Admitting: Obstetrics and Gynecology

## 2020-06-27 ENCOUNTER — Other Ambulatory Visit: Payer: Self-pay

## 2020-06-27 ENCOUNTER — Ambulatory Visit (INDEPENDENT_AMBULATORY_CARE_PROVIDER_SITE_OTHER): Payer: Commercial Managed Care - PPO | Admitting: Obstetrics and Gynecology

## 2020-06-27 VITALS — BP 144/76 | HR 84 | Ht 64.0 in | Wt 150.0 lb

## 2020-06-27 DIAGNOSIS — Z01419 Encounter for gynecological examination (general) (routine) without abnormal findings: Secondary | ICD-10-CM | POA: Diagnosis not present

## 2020-06-27 NOTE — Patient Instructions (Signed)

## 2020-10-16 ENCOUNTER — Telehealth: Payer: Self-pay | Admitting: Obstetrics and Gynecology

## 2020-10-16 NOTE — Telephone Encounter (Signed)
Please contact [facility] regarding letter they sent about follow up mammogram care.   Please let them know that the patient has transferred her mammogram care to the [facility].

## 2020-10-17 NOTE — Telephone Encounter (Signed)
I spoke with Loree Fee at [facility] and informed her patient going to [facility] now for [facility] care.

## 2020-12-08 NOTE — Progress Notes (Signed)
[facility]  [phone] [phone]     ID: Theresa [name] DOB: Nov 02, 1955  MR#: [id number]  XHB#:[id number]  Patient Care Team: Theresa Noble, MD as PCP - General (Internal Medicine) Theresa Kaufmann, RN as Oncology Nurse Navigator Theresa Germany, RN as Oncology Nurse Navigator Theresa Klein, MD as Consulting Physician (General Surgery) Theresa [name], Theresa Dad, MD as Consulting Physician (Oncology) Theresa Gibson, MD as Attending Physician (Radiation Oncology) Theresa Cobbs, MD as Consulting Physician (Obstetrics and Gynecology) Theresa Bookbinder, MD as Consulting Physician (Dermatology) Theresa Hughs, MD as Consulting Physician (Urology) Theresa Craver, MD as Consulting Physician (Gastroenterology) Theresa Cruel, MD OTHER MD:  CHIEF COMPLAINT: Estrogen receptor positive breast cancer  CURRENT TREATMENT: Tamoxifen   INTERVAL HISTORY: Theresa [name] returns today for follow up of her estrogen receptor positive breast cancer.   She continues on tamoxifen.  She generally tolerates this well, although she is experiencing more hot flashes in the evening than she used to.  Since her last visit, she underwent bilateral diagnostic mammography with tomography at [facility] on 06/17/2020 showing: breast density category C; no evidence of malignancy in either breast.    REVIEW OF SYSTEMS: Theresa [name] continues to work on her dressage and is trying to get a Insurance risk surveyor with her horse.  This keeps her quite busy and she has already taken 5967 steps this morning.  Aside from these issues a detailed review of systems today was stable   COVID 19 VACCINATION STATUS:    HISTORY OF CURRENT ILLNESS: From the original intake note:  Theresa [name] has a history of prior left breast excision on 09/08/2012. Pathology from this ([id number]) showed: cyst with features of rupture and associated fat necrosis; no evidence of malignancy.  She had routine screening mammography  on 06/14/2019 showing a possible abnormality in the right breast. She then underwent right diagnostic mammography with tomography and right breast ultrasonography at [facility] on 06/21/2019 showing: breast density category C; 1.1 cm irregular mass in the right breast at 3:30.; adjacent smaller 6 mm mass in the right breast at 3:30; no suspicious right axillary lymph nodes.  Accordingly on 07/03/2019 she proceeded to biopsy of the right breast areas in question. The pathology from this procedure ([id number]) showed: invasive mammary carcinoma, grade 2, e-cadherin positive. Prognostic indicators significant for: estrogen receptor, 95% positive with strong staining intensity, progesterone receptor, 0% negative. Proliferation marker Ki67 at 5%. HER2 equivocal by immunohistochemistry (2+), but negative by fluorescent in situ hybridization with a signals ratio 1.58 and number per cell 2.60.  The patient's subsequent history is as detailed below.   PAST MEDICAL HISTORY: Past Medical History:  Diagnosis Date  . Breast cancer, right ([place])   . Contact lens/glasses fitting    wears contacts or glasses  . Diarrhea    --post gallbladder surgery  . Family history of bone cancer   . Family history of brain cancer   . Family history of stomach cancer   . Family history of uterine cancer   . Fibroids   . History of radiation therapy   . Hypertension   . No pertinent past medical history   . Personal history of radiation therapy   . PONV (postoperative nausea and vomiting)   . Right ovarian cyst   . 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   PAST SURGICAL HISTORY: Past Surgical History:  Procedure Laterality Date  . BLADDER SUSPENSION    . BREAST BIOPSY  09/08/2012  Procedure: BREAST BIOPSY;  Surgeon: Odis Hollingshead, MD;  Location: [facility];  Service: General;  Laterality: Left;  remove left breast mass  . BREAST LUMPECTOMY Right 10/2019  . BREAST LUMPECTOMY WITH RADIOACTIVE SEED  AND SENTINEL LYMPH NODE BIOPSY Right 10/04/2019   Procedure: RIGHT BREAST LUMPECTOMY WITH BRACKETED RADIOACTIVE SEEDS, RIGHT BREAST RADIOACTIVE SEED GUIDED EXCISION BIOPSY, AND RIGHT SENTINEL LYMPH NODE BIOPSY;  Surgeon: Theresa Klein, MD;  Location: [facility];  Service: General;  Laterality: Right;  . BREAST SURGERY     lumpectomy x2  . BUNIONECTOMY Right 01/2016  . CHOLECYSTECTOMY    . COLONOSCOPY    . CYSTOSCOPY N/A 01/16/2020   Procedure: CYSTOSCOPY;  Surgeon: Theresa Cobbs, MD;  Location: [facility];  Service: Gynecology;  Laterality: N/A;  . MASTOPEXY Right 10/04/2019   Procedure: RIGHT BREAST MASTOPEXY;  Surgeon: Theresa Klein, MD;  Location: [facility];  Service: General;  Laterality: Right;  . RE-EXCISION OF BREAST LUMPECTOMY Right 10/31/2019   Procedure: RIGHT RE-EXCISION OF BREAST LUMPECTOMY;  Surgeon: Theresa Klein, MD;  Location: [facility];  Service: General;  Laterality: Right;  . TOTAL LAPAROSCOPIC HYSTERECTOMY WITH BILATERAL SALPINGO OOPHORECTOMY N/A 01/16/2020   Procedure: TOTAL LAPAROSCOPIC HYSTERECTOMY WITH BILATERAL SALPINGO OOPHORECTOMY/COLLECTION OF PELVIC WASHINGS, LYSIS OF ADHESIONS;  Surgeon: Theresa Cobbs, MD;  Location: [facility];  Service: Gynecology;  Laterality: N/A;  collection of pelvic washings  . TUBAL LIGATION      FAMILY HISTORY: Family History  Problem Relation Age of Onset  . Hypertension Father   . Stomach cancer Father        may have been colon, diagnosed in his 25s  . Depression Mother   . Dementia Mother   . Thyroid disease Sister   . Heart disease Sister   . Brain cancer Maternal Aunt 80  . Cancer Maternal Grandmother        undetermined type, diagnosed in her 30s  . Bone cancer Paternal Grandfather 60  . Uterine cancer Paternal Aunt 20  . Uterine cancer Cousin        paternal 1st cousin, diagnosed in her late 67s  . Uterine cancer Cousin 35        paternal 8st cousin  Patient's father was 26 years old when he died.  He had "stomach cancer", she thinks possibly colon.  Patient's mother died from complications from a fall at age 15. The patient denies a family hx of breast or ovarian cancer. She had no brothers, 2 sisters, 1 of whom died from sepsis.  1 grandmother, a former Armed forces logistics/support/administrative officer, died from what may have been lung cancer.  She also reports uterine cancer in a cousin and possibly an aunt.   GYNECOLOGIC HISTORY:  Patient's last menstrual period was 09/01/2007 (exact date). Menarche: 65 years old Age at first live birth: 65 years old (the patient has a retroverted uterus, likely explaining her difficulty in getting pregnant) [place] P 1 LMP around age 51 Contraceptive used from [id number], no complications HRT used for 5 years, 2013 until 01 /4193 , no complications Hysterectomy? Yes, 12/2019 BSO? 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 Her husband Coralyn Pear is a Librarian, academic for an Geophysicist/field seismologist. She lives at home with husband Daryl.  They have some horses.  Son Luisa Hart, age 13, is studying criminal justice at [employer] in [place].  ADVANCED DIRECTIVES: In the absence of any documentation to the contrary, the patient's spouse is their HCPOA.   HEALTH MAINTENANCE: Social History   Tobacco Use  . Smoking status: Former Smoker    Types: Cigarettes    Quit date: 03/15/1996    Years since quitting: 24.7  . Smokeless tobacco: Never Used  Vaping Use  . Vaping Use: Never used  Substance Use Topics  . Alcohol use: Yes    Alcohol/week: 0.0 standard drinks    Comment: occ wine  . Drug use: No     Colonoscopy: date unsure, possibly 2014  PAP: 06/2019, negative  Bone density: never done   Allergies  Allergen Reactions  . Flagyl [Metronidazole]     C-Diff  . Other     Dermabond - redness and burning, blistering  . Adhesive [Tape] Rash    "burning" skin    Current  Outpatient Medications  Medication Sig Dispense Refill  . Ascorbic Acid (VITAMIN C) 100 MG tablet Take 100 mg by mouth daily.    . Cholecalciferol (VITAMIN D) 10 MCG/ML LIQD Take 1 drop by mouth in the morning and at bedtime.     Marland Kitchen CRANBERRY PO Take 1 tablet by mouth in the morning and at bedtime.     . hydrocortisone 2.5 % cream Apply topically 3 (three) times daily as needed. 28 g 3  . Lactobacillus Rhamnosus, GG, (CULTURELLE PO) Take 1 capsule by mouth daily.     Marland Kitchen loperamide (IMODIUM) 2 MG capsule Take 2 mg by mouth in the morning and at bedtime.     . Magnesium 100 MG TABS Take 100 mg by mouth daily.     . Melatonin 10 MG CAPS Take 10 mg by mouth at bedtime.     . nitrofurantoin (MACRODANTIN) 50 MG capsule Take 50 mg by mouth daily.    . Pumpkin Seed-Soy Germ (AZO BLADDER CONTROL/GO-LESS PO) Take 1 tablet by mouth in the morning and at bedtime.     Marland Kitchen QUERCETIN PO Take 1 tablet by mouth in the morning and at bedtime.    . tamoxifen (NOLVADEX) 20 MG tablet Take 1 tablet (20 mg total) by mouth daily. 90 tablet 4  . triamcinolone (KENALOG) 0.025 % ointment Apply 1 application topically 2 (two) times daily. 30 g 0  . TURMERIC PO Take 5 mLs by mouth daily.    . valsartan (DIOVAN) 40 MG tablet Take 40 mg by mouth at bedtime.    Marland Kitchen venlafaxine XR (EFFEXOR-XR) 37.5 MG 24 hr capsule Take 1 capsule (37.5 mg total) by mouth daily. 90 capsule 4  . vitamin E 180 MG (400 UNITS) capsule Take 400 Units by mouth daily.     No current facility-administered medications for this visit.    OBJECTIVE:  white woman who appears younger than stated age  88:   12/09/20 1134  BP: (!) 144/82  Pulse: 71  Resp: 18  Temp: 97.9 F (36.6 C)  SpO2: 100%     Body mass index is 26.35 kg/m.   Wt Readings from Last 3 Encounters:  12/09/20 153 lb 8 oz (69.6 kg)  06/27/20 150 lb (68 kg)  04/16/20 149 lb 9.6 oz (67.9 kg)      ECOG FS:1 - Symptomatic but completely ambulatory  Sclerae unicteric, EOMs  intact Wearing a mask No cervical or supraclavicular adenopathy Lungs no rales or rhonchi Heart regular rate and rhythm Abd soft, nontender, positive bowel sounds MSK no focal spinal tenderness, no upper extremity lymphedema  Neuro: nonfocal, well oriented, appropriate affect Breasts: Status post right lumpectomy and radiation.  There is no evidence of chest wall recurrence.  The right breast is now slightly smaller than the left and there is minimal distortion of the contour near the nipple.  The left breast and both axillae are benign.   LAB RESULTS:  CMP     Component Value Date/Time   NA 137 03/08/2020 1001   K 5.0 03/08/2020 1001   CL 104 03/08/2020 1001   CO2 24 03/08/2020 1001   GLUCOSE 102 (H) 03/08/2020 1001   BUN 14 03/08/2020 1001   CREATININE 0.78 03/08/2020 1001   CALCIUM 9.2 03/08/2020 1001   PROT 6.6 03/08/2020 1001   ALBUMIN 3.9 03/08/2020 1001   AST 17 03/08/2020 1001   ALT 14 03/08/2020 1001   ALKPHOS 40 03/08/2020 1001   BILITOT 0.3 03/08/2020 1001   GFRNONAA >60 03/08/2020 1001   GFRAA >60 03/08/2020 1001    No results found for: TOTALPROTELP, ALBUMINELP, A1GS, A2GS, BETS, BETA2SER, GAMS, MSPIKE, SPEI  No results found for: KPAFRELGTCHN, LAMBDASER, KAPLAMBRATIO  Lab Results  Component Value Date   WBC 5.8 12/09/2020   NEUTROABS 2.8 12/09/2020   HGB 11.8 (L) 12/09/2020   HCT 36.6 12/09/2020   MCV 96.1 12/09/2020   PLT 199 12/09/2020    No results found for: LABCA2  No components found for: BVQXIH038  No results for input(s): INR in the last 168 hours.  No results found for: LABCA2  No results found for: CAN199  Lab Results  Component Value Date   CAN125 7.8 07/06/2019    No results found for: UEK800  No results found for: CA2729  No components found for: HGQUANT  No results found for: CEA1 / No results found for: CEA1   No results found for: AFPTUMOR  No results found for: CHROMOGRNA  No results found for: HGBA, HGBA2QUANT,  HGBFQUANT, HGBSQUAN (Hemoglobinopathy evaluation)   No results found for: LDH  No results found for: IRON, TIBC, IRONPCTSAT (Iron and TIBC)  No results found for: FERRITIN  Urinalysis    Component Value Date/Time   BILIRUBINUR n 05/15/2016 0902   PROTEINUR n 05/15/2016 0902   UROBILINOGEN negative 05/15/2016 0902   NITRITE n 05/15/2016 0902   LEUKOCYTESUR moderate (2+) (A) 05/15/2016 0902    STUDIES: No results found.   ELIGIBLE FOR AVAILABLE RESEARCH PROTOCOL: no  ASSESSMENT: 65 y.o. [place] woman status post right lower inner quadrant biopsy 07/03/2019 for a clinicaly multiple T1c N0, stage IA invasive ductal carcinoma, grade 2, E-cadherin positive, strongly estrogen receptor positive but progesterone receptor negative and HER-2 not amplified, with an MIB-1 of 5%  (a) breast MRI 08/07/2019 showed 2 additional suspicious areas in the right breast and 1 in the left breast   (b) biopsy of all 3 areas 08/21/2019 showed no malignancy  (1) tamoxifen started neoadjuvantly 07/26/2019  (2) right lumpectomy and sentinel lymph node sampling 10/04/2019 showed a pT2 pN1, stage IIB invasive ductal carcinoma, grade 2, with positive lymphovascular invasion and a positive superior margin  (a) 3 sentinel lymph nodes removed, one with macro metastatic deposit, 1 with a micrometastatic deposit  (b) additional surgery for margin clearance   (3) MammaPrint on the 10/04/2019 sample shows a low risk luminal A tumor predicting a 5-year metastasis free survival of 96% without chemotherapy, and a chemotherapy benefit of less than 1.5%  (4) adjuvant radiation 11/27/2019 through 01/05/2020 Site Technique Total Dose (Gy) Dose per Fx (Gy) Completed Fx Beam  Energies  Breast, Right: Breast_Rt 3D 50/50 2 25/25 6X, 10X  Breast, Right: Breast_Rt_SCV_PAB 3D 50/50 2 25/25 6X, 10X  Breast, Right: Breast_Rt_Bst 3D 10/10 2 5/5 6X, 10X   (5) genetics testing 08/04/2019 through the STAT Breast cancer panel  offered by [facility] found no deleterious mutations in ATM, BRCA1, BRCA2, CDH1, CHEK2, PALB2, PTEN, STK11 and TP53.  The Common Hereditary Cancers Panel offered by [facility] includes sequencing and/or deletion duplication testing of the following 48 genes: APC, ATM, AXIN2, BARD1, BMPR1A, BRCA1, BRCA2, BRIP1, CDH1, CDK4, CDKN2A (p14ARF), CDKN2A (p16INK4a), CHEK2, CTNNA1, DICER1, EPCAM (Deletion/duplication testing only), GREM1 (promoter region deletion/duplication testing only), KIT, MEN1, MLH1, MSH2, MSH3, MSH6, MUTYH, NBN, NF1, NHTL1, PALB2, PDGFRA, PMS2, POLD1, POLE, PTEN, RAD50, RAD51C, RAD51D, RNF43, SDHB, SDHC, SDHD, SMAD4, SMARCA4. STK11, TP53, TSC1, TSC2, and VHL.  The following genes were evaluated for sequence changes only: SDHA and HOXB13 c.251G>A variant only.   (6) tamoxifen started November 2020.  To be continued for 10 years  (a) status post hysterectomy and bilateral salpingo-oophorectomy 01/16/2020 with benign pathology    PLAN: Nerissa is now just over a year out from definitive surgery for her breast cancer with no evidence of disease recurrence.  This is favorable.  She is tolerating tamoxifen generally well.  The venlafaxine is helping some and we discussed possibly going up on the dose but she really would prefer not to do that.  On the other hand that she is more open to trying Neurontin at bedtime and I have placed that prescription in for her.  If she has any difficulties she will let me know.  Otherwise she will have mammography again in October .  She will see me the first week in November.  From that point we are likely to start seeing her on a once a year basis  She knows to call for any other issue that may develop before the next visit  Total encounter time 25 minutes.Theresa Cruel, MD   12/09/2020 11:43 AM Medical Oncology and Hematology [facility] [place], [place] [address] Tel. [phone]    Fax. [phone]   This document  serves as a record of services personally performed by Lurline Del, MD. It was created on his behalf by Wilburn Mylar, a trained medical scribe. The creation of this record is based on the scribe's personal observations and the provider's statements to them.   I, Lurline Del MD, have reviewed the above documentation for accuracy and completeness, and I agree with the above.   *Total Encounter Time as defined by the Centers for Medicare and Medicaid Services includes, in addition to the face-to-face time of a patient visit (documented in the note above) non-face-to-face time: obtaining and reviewing outside history, ordering and reviewing medications, tests or procedures, care coordination (communications with other health care professionals or caregivers) and documentation in the medical record.

## 2020-12-09 ENCOUNTER — Telehealth: Payer: Self-pay

## 2020-12-09 ENCOUNTER — Inpatient Hospital Stay: Payer: Commercial Managed Care - PPO

## 2020-12-09 ENCOUNTER — Inpatient Hospital Stay: Payer: Commercial Managed Care - PPO | Attending: Oncology | Admitting: Oncology

## 2020-12-09 ENCOUNTER — Other Ambulatory Visit: Payer: Self-pay

## 2020-12-09 VITALS — BP 144/82 | HR 71 | Temp 97.9°F | Resp 18 | Ht 64.0 in | Wt 153.5 lb

## 2020-12-09 DIAGNOSIS — Z923 Personal history of irradiation: Secondary | ICD-10-CM | POA: Insufficient documentation

## 2020-12-09 DIAGNOSIS — Z79899 Other long term (current) drug therapy: Secondary | ICD-10-CM | POA: Diagnosis not present

## 2020-12-09 DIAGNOSIS — Z818 Family history of other mental and behavioral disorders: Secondary | ICD-10-CM | POA: Diagnosis not present

## 2020-12-09 DIAGNOSIS — Z8349 Family history of other endocrine, nutritional and metabolic diseases: Secondary | ICD-10-CM | POA: Diagnosis not present

## 2020-12-09 DIAGNOSIS — Z9049 Acquired absence of other specified parts of digestive tract: Secondary | ICD-10-CM | POA: Insufficient documentation

## 2020-12-09 DIAGNOSIS — Z8049 Family history of malignant neoplasm of other genital organs: Secondary | ICD-10-CM | POA: Insufficient documentation

## 2020-12-09 DIAGNOSIS — C50311 Malignant neoplasm of lower-inner quadrant of right female breast: Secondary | ICD-10-CM

## 2020-12-09 DIAGNOSIS — Z809 Family history of malignant neoplasm, unspecified: Secondary | ICD-10-CM | POA: Insufficient documentation

## 2020-12-09 DIAGNOSIS — Z888 Allergy status to other drugs, medicaments and biological substances status: Secondary | ICD-10-CM | POA: Insufficient documentation

## 2020-12-09 DIAGNOSIS — Z170 Estrogen receptor positive status [ER+]: Secondary | ICD-10-CM

## 2020-12-09 DIAGNOSIS — Z8249 Family history of ischemic heart disease and other diseases of the circulatory system: Secondary | ICD-10-CM | POA: Insufficient documentation

## 2020-12-09 DIAGNOSIS — Z800 Family history of malignant neoplasm of digestive organs: Secondary | ICD-10-CM | POA: Diagnosis not present

## 2020-12-09 DIAGNOSIS — Z881 Allergy status to other antibiotic agents status: Secondary | ICD-10-CM | POA: Insufficient documentation

## 2020-12-09 DIAGNOSIS — Z90722 Acquired absence of ovaries, bilateral: Secondary | ICD-10-CM | POA: Diagnosis not present

## 2020-12-09 DIAGNOSIS — Z808 Family history of malignant neoplasm of other organs or systems: Secondary | ICD-10-CM | POA: Insufficient documentation

## 2020-12-09 DIAGNOSIS — Z87891 Personal history of nicotine dependence: Secondary | ICD-10-CM | POA: Insufficient documentation

## 2020-12-09 DIAGNOSIS — Z79810 Long term (current) use of selective estrogen receptor modulators (SERMs): Secondary | ICD-10-CM | POA: Diagnosis not present

## 2020-12-09 LAB — CBC WITH DIFFERENTIAL (CANCER CENTER ONLY)
Abs Immature Granulocytes: 0.02 10*3/uL (ref 0.00–0.07)
Basophils Absolute: 0 10*3/uL (ref 0.0–0.1)
Basophils Relative: 1 %
Eosinophils Absolute: 0.1 10*3/uL (ref 0.0–0.5)
Eosinophils Relative: 2 %
HCT: 36.6 % (ref 36.0–46.0)
Hemoglobin: 11.8 g/dL — ABNORMAL LOW (ref 12.0–15.0)
Immature Granulocytes: 0 %
Lymphocytes Relative: 41 %
Lymphs Abs: 2.4 10*3/uL (ref 0.7–4.0)
MCH: 31 pg (ref 26.0–34.0)
MCHC: 32.2 g/dL (ref 30.0–36.0)
MCV: 96.1 fL (ref 80.0–100.0)
Monocytes Absolute: 0.5 10*3/uL (ref 0.1–1.0)
Monocytes Relative: 9 %
Neutro Abs: 2.8 10*3/uL (ref 1.7–7.7)
Neutrophils Relative %: 47 %
Platelet Count: 199 10*3/uL (ref 150–400)
RBC: 3.81 MIL/uL — ABNORMAL LOW (ref 3.87–5.11)
RDW: 13.1 % (ref 11.5–15.5)
WBC Count: 5.8 10*3/uL (ref 4.0–10.5)
nRBC: 0 % (ref 0.0–0.2)

## 2020-12-09 LAB — CMP (CANCER CENTER ONLY)
ALT: 17 U/L (ref 0–44)
AST: 19 U/L (ref 15–41)
Albumin: 4.3 g/dL (ref 3.5–5.0)
Alkaline Phosphatase: 36 U/L — ABNORMAL LOW (ref 38–126)
Anion gap: 11 (ref 5–15)
BUN: 12 mg/dL (ref 8–23)
CO2: 24 mmol/L (ref 22–32)
Calcium: 9.2 mg/dL (ref 8.9–10.3)
Chloride: 103 mmol/L (ref 98–111)
Creatinine: 0.71 mg/dL (ref 0.44–1.00)
GFR, Estimated: >60 mL/min (ref >60)
Glucose, Bld: 100 mg/dL — ABNORMAL HIGH (ref 70–99)
Potassium: 4.6 mmol/L (ref 3.5–5.1)
Sodium: 138 mmol/L (ref 135–145)
Total Bilirubin: 0.3 mg/dL (ref 0.3–1.2)
Total Protein: 6.8 g/dL (ref 6.5–8.1)

## 2020-12-09 MED ORDER — GABAPENTIN 300 MG PO CAPS: 300.0000 mg | ORAL_CAPSULE | Freq: Every day | ORAL | 4 refills | Status: DC

## 2020-12-09 MED ORDER — TAMOXIFEN CITRATE 20 MG PO TABS: 20.0000 mg | ORAL_TABLET | Freq: Every day | ORAL | 4 refills | Status: DC

## 2020-12-09 MED ORDER — VENLAFAXINE HCL ER 37.5 MG PO CP24: 37.5000 mg | ORAL_CAPSULE | Freq: Every day | ORAL | 4 refills | Status: DC

## 2020-12-09 NOTE — Telephone Encounter (Signed)
error 

## 2020-12-11 ENCOUNTER — Telehealth: Payer: Self-pay | Admitting: Oncology

## 2020-12-11 NOTE — Telephone Encounter (Signed)
Scheduled per 4/11 los. Called and spoke with pt, confirmed 11/1 appt

## 2021-05-20 ENCOUNTER — Other Ambulatory Visit: Payer: Self-pay | Admitting: Oncology

## 2021-05-20 DIAGNOSIS — Z1231 Encounter for screening mammogram for malignant neoplasm of breast: Secondary | ICD-10-CM

## 2021-06-13 ENCOUNTER — Other Ambulatory Visit: Payer: Self-pay | Admitting: Oncology

## 2021-06-13 DIAGNOSIS — Z853 Personal history of malignant neoplasm of breast: Secondary | ICD-10-CM

## 2021-06-18 ENCOUNTER — Other Ambulatory Visit: Payer: Self-pay

## 2021-06-18 ENCOUNTER — Ambulatory Visit
Admission: RE | Admit: 2021-06-18 | Discharge: 2021-06-18 | Disposition: A | Discharge status: Home / Self Care | Payer: Commercial Managed Care - PPO | Source: Ambulatory Visit | Attending: Oncology | Admitting: Oncology

## 2021-06-18 DIAGNOSIS — Z853 Personal history of malignant neoplasm of breast: Secondary | ICD-10-CM

## 2021-06-18 IMAGING — MG DIGITAL DIAGNOSTIC BILAT W/ TOMO W/ CAD
9 series · 9 of 25 positions shown · non-contrast
Comparison: Previous exam(s).

CLINICAL DATA: 65-year-old female status post malignant right
lumpectomy with radiation in [BI].

EXAM:
DIGITAL DIAGNOSTIC BILATERAL MAMMOGRAM WITH TOMOSYNTHESIS AND CAD
TECHNIQUE: Bilateral digital diagnostic mammography and breast tomosynthesis
was performed. The images were evaluated with computer-aided
detection.

[R CC]
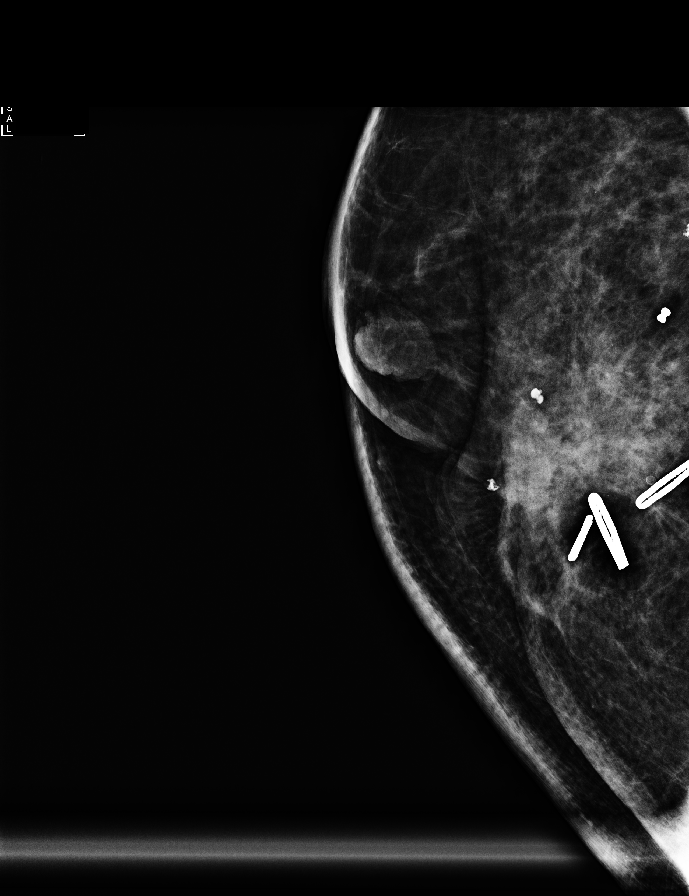

[L MLO synth-2D]
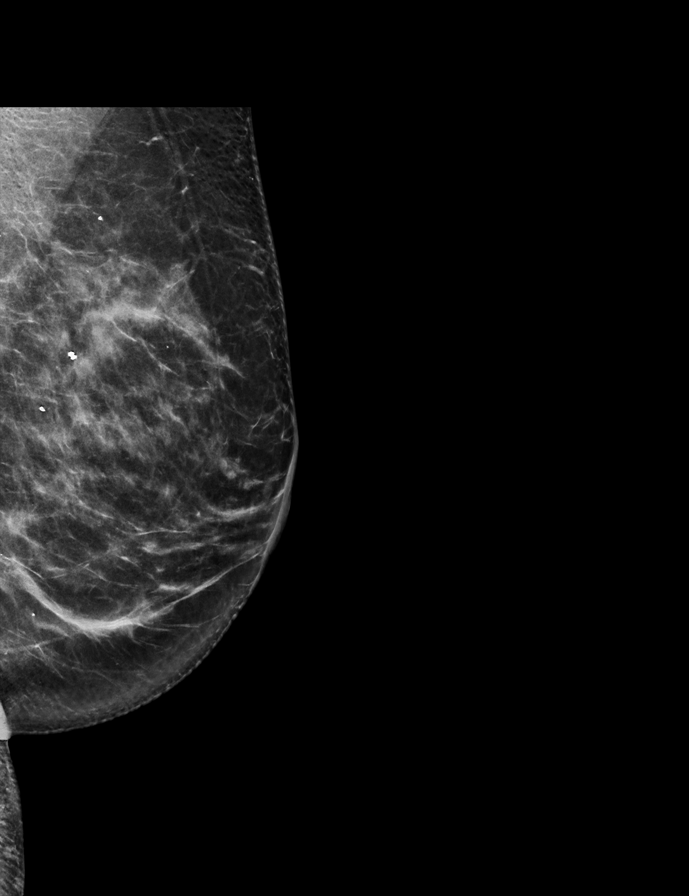

[L CC synth-2D]
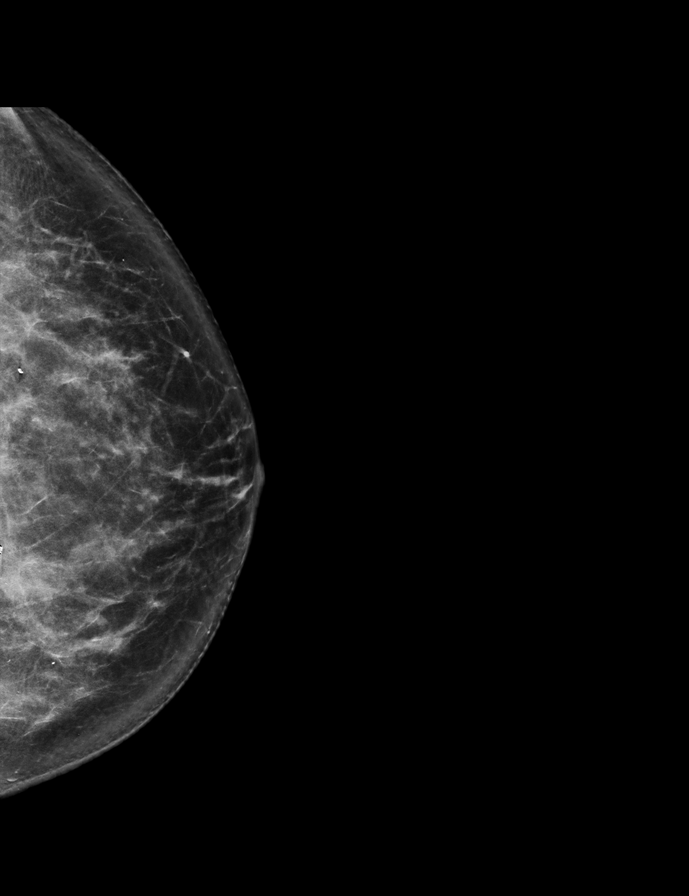

[R CC synth-2D]
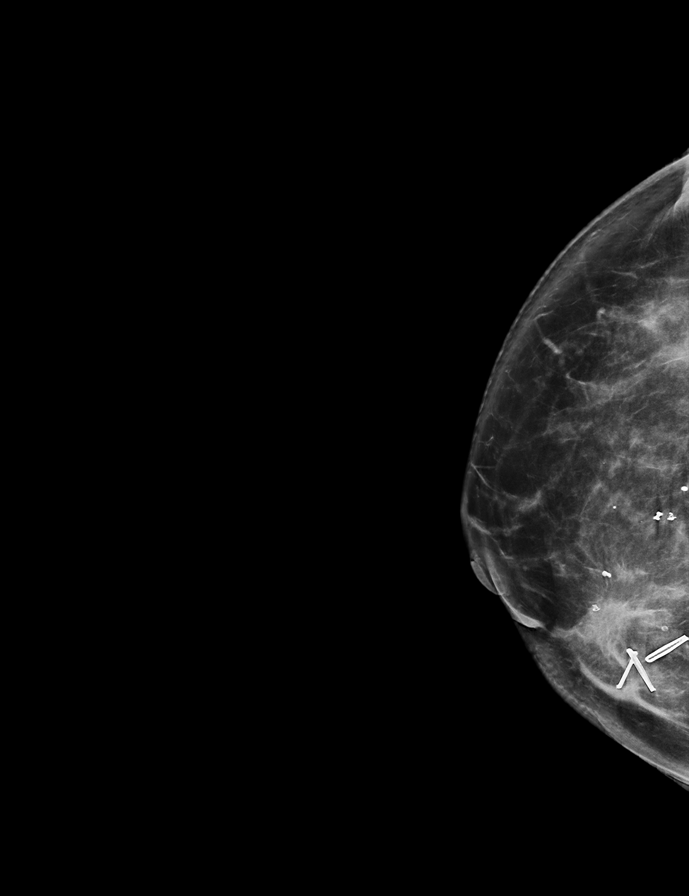

[R MLO synth-2D]
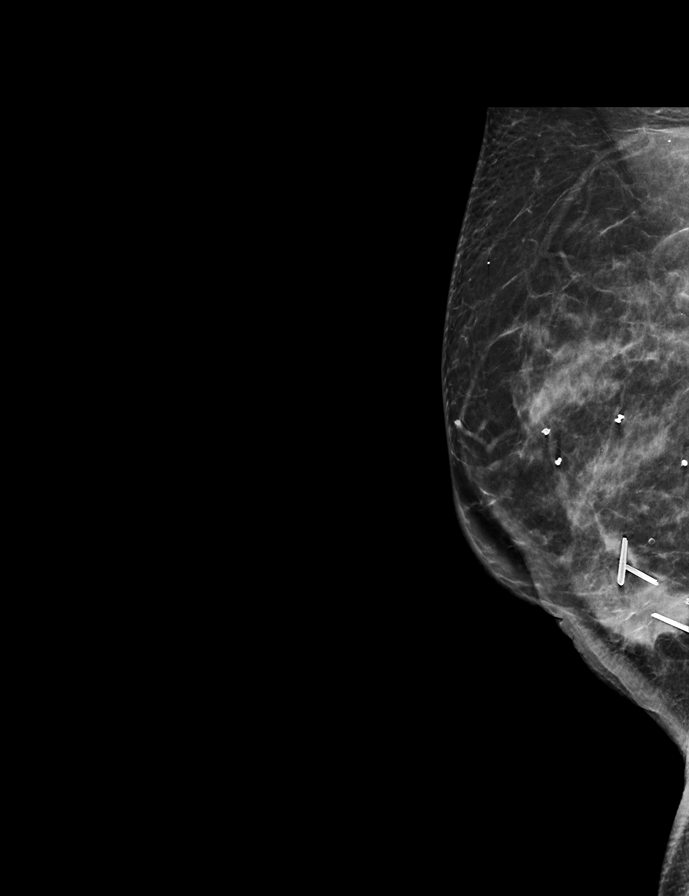

[R CC tomo · tomo slice 37/74.0]
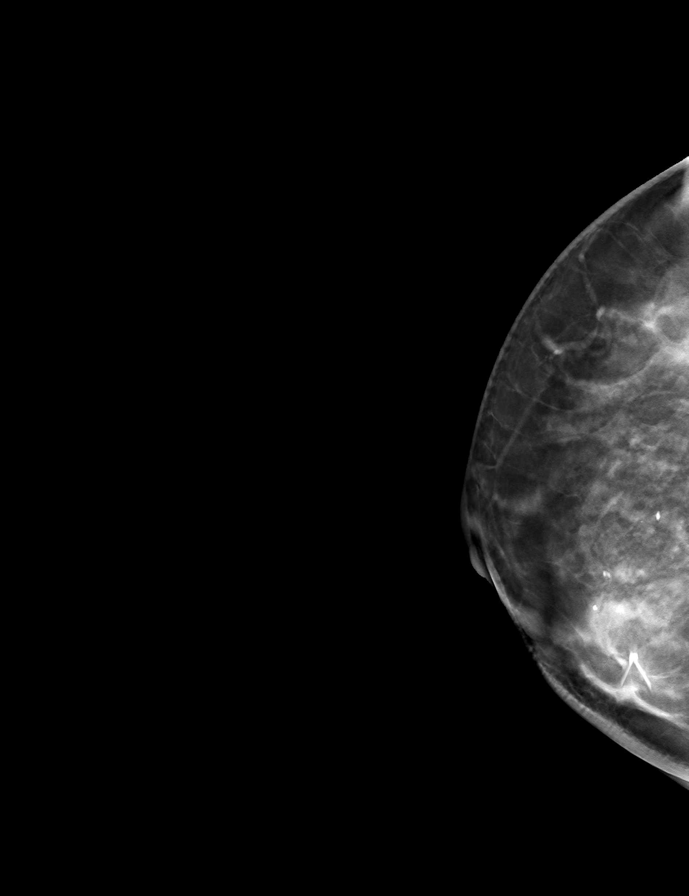

[L CC tomo · tomo slice 36/71.0]
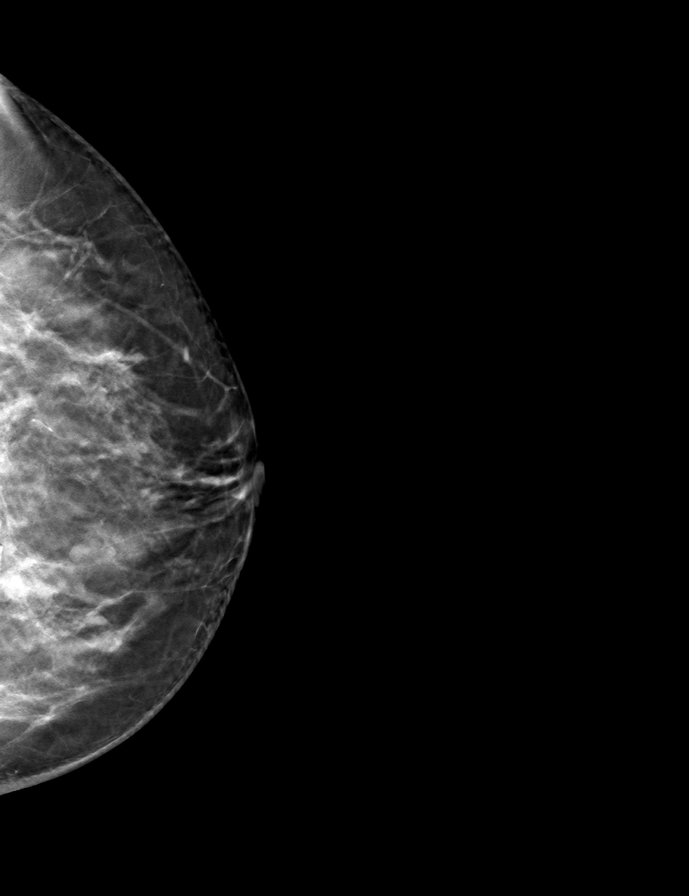

[L MLO tomo · tomo slice 35/68.0]
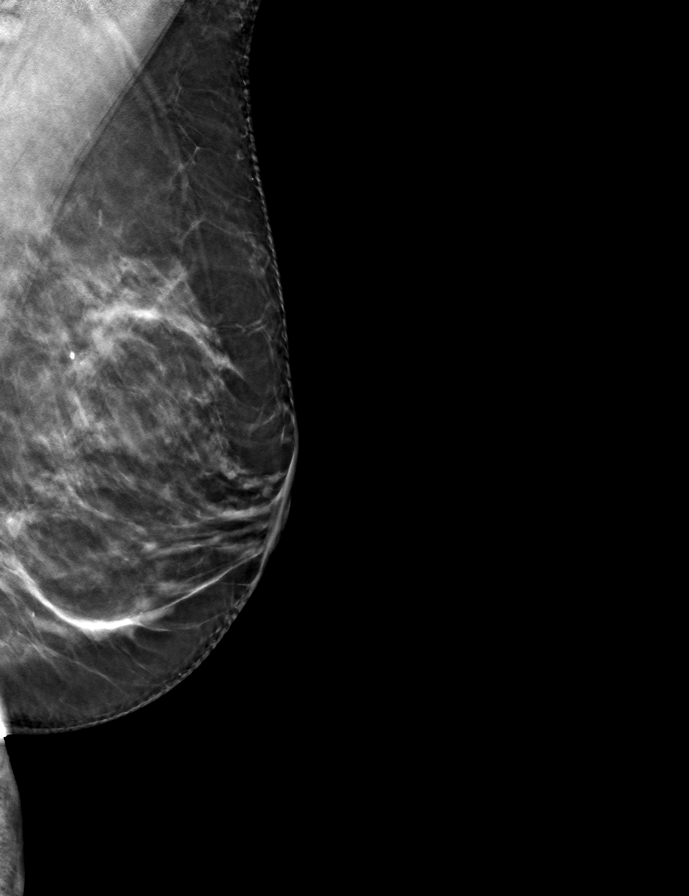

[R MLO tomo · tomo slice 34/67.0]
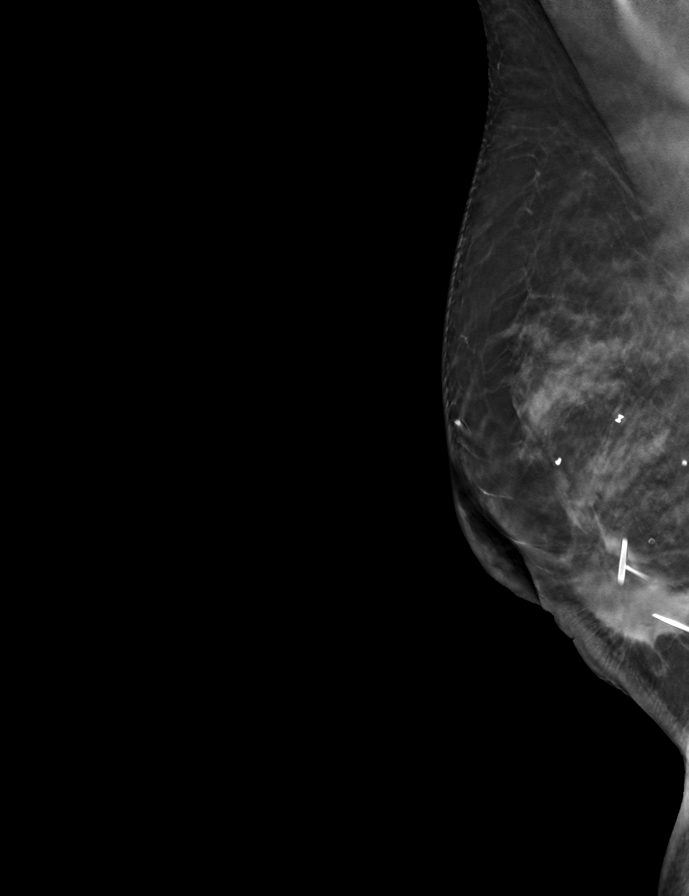

[9 of 25 positions shown; findings below may reference images not displayed]

ACR Breast Density Category c: The breast tissue is heterogeneously
dense, which may obscure small masses.
FINDINGS: Stable post lumpectomy changes noted on the right. No new or
suspicious findings in either breast.
IMPRESSION: 1. No mammographic evidence of malignancy in either breast.
2. Stable right breast posttreatment changes.

RECOMMENDATION:
Diagnostic mammogram is suggested in 1 year. (Code:[BI])

I have discussed the findings and recommendations with the patient.
If applicable, a reminder letter will be sent to the patient
regarding the next appointment.

BI-RADS CATEGORY  2: Benign.

## 2021-06-30 ENCOUNTER — Other Ambulatory Visit: Payer: Self-pay | Admitting: *Deleted

## 2021-06-30 ENCOUNTER — Other Ambulatory Visit: Payer: Self-pay | Admitting: Oncology

## 2021-06-30 DIAGNOSIS — C50311 Malignant neoplasm of lower-inner quadrant of right female breast: Secondary | ICD-10-CM

## 2021-06-30 DIAGNOSIS — Z170 Estrogen receptor positive status [ER+]: Secondary | ICD-10-CM

## 2021-06-30 NOTE — Progress Notes (Signed)
[place]  [phone] [phone]) [phone]     ID: Theresa [name] DOB: Dec 09, 1955  MR#: [id number]  OBS#:[id number]  Patient Care Team: [name], MD as PCP - General (Internal Medicine) Mauro Kaufmann, RN as Oncology Nurse Navigator Rockwell Germany, RN as Oncology Nurse Navigator Stark Klein, MD as Consulting Physician (General Surgery) Mennie Spiller, Virgie Dad, MD as Consulting Physician (Oncology) Eppie Gibson, MD as Attending Physician (Radiation Oncology) Nunzio Cobbs, MD as Consulting Physician (Obstetrics and Gynecology) Rolm Bookbinder, MD as Consulting Physician (Dermatology) Ardis Hughs, MD as Consulting Physician (Urology) Juanita Craver, MD as Consulting Physician (Gastroenterology) Renette Butters, MD as Attending Physician (Orthopedic Surgery) Chauncey Cruel, MD OTHER MD:  CHIEF COMPLAINT: Estrogen receptor positive breast cancer  CURRENT TREATMENT: Tamoxifen   INTERVAL HISTORY: Theresa [name] returns today for follow up of her estrogen receptor positive breast cancer.   She continues on tamoxifen.  She still has significant hot flashes despite being on low-dose venlafaxine and gabapentin at bedtime.  Otherwise she tolerates it with no significant side effects.  Since her last visit, she underwent bilateral diagnostic mammography with tomography at [facility] on 06/18/2021 showing: breast density category C; no evidence of malignancy in either breast.    REVIEW OF SYSTEMS: Diora rides horses 5 days a week, and gets about 14,000 steps most days.  She is having some knee issues which [facility] is helping her with.  She had a colonoscopy under Dr. Collene Mares.  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 Pathology from this ([id number]) showed:  cyst with features of rupture and associated fat necrosis; no evidence of malignancy.  She had routine screening mammography on 06/14/2019 showing a possible abnormality in the right breast. She then underwent right diagnostic mammography with tomography and right breast ultrasonography at [facility] on 06/21/2019 showing: breast density category C; 1.1 cm irregular mass in the right breast at 3:30.; adjacent smaller 6 mm mass in the right breast at 3:30; no suspicious right axillary lymph nodes.  Accordingly on 07/03/2019 she proceeded to biopsy of the right breast areas in question. The pathology from this procedure ([id number]) showed: invasive mammary carcinoma, grade 2, e-cadherin positive. Prognostic indicators significant for: estrogen receptor, 95% positive with strong staining intensity, progesterone receptor, 0% negative. Proliferation marker Ki67 at 5%. HER2 equivocal by immunohistochemistry (2+), but negative by fluorescent in situ hybridization with a signals ratio 1.58 and number per cell 2.60.  The patient's subsequent history is as detailed below.   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   PAST SURGICAL HISTORY: Past Surgical History:  Procedure Laterality Date   BLADDER SUSPENSION     BREAST BIOPSY  09/08/2012   Procedure: BREAST BIOPSY;  Surgeon: Odis Hollingshead, MD;  Location: [place];  Service: General;  Laterality:  Left;  remove left breast mass   BREAST LUMPECTOMY Right 10/2019   BREAST LUMPECTOMY WITH RADIOACTIVE SEED AND SENTINEL LYMPH NODE BIOPSY Right 10/04/2019   Procedure: RIGHT BREAST LUMPECTOMY WITH BRACKETED RADIOACTIVE SEEDS, RIGHT BREAST RADIOACTIVE SEED GUIDED EXCISION BIOPSY, AND RIGHT SENTINEL LYMPH NODE BIOPSY;  Surgeon: Stark Klein, MD;  Location: [place];  Service: General;  Laterality: Right;   BREAST SURGERY     lumpectomy x2   BUNIONECTOMY Right 01/2016   CHOLECYSTECTOMY     COLONOSCOPY     CYSTOSCOPY N/A 01/16/2020   Procedure: CYSTOSCOPY;  Surgeon: Nunzio Cobbs, MD;  Location: [facility];  Service: Gynecology;  Laterality: N/A;   MASTOPEXY Right 10/04/2019   Procedure: RIGHT BREAST MASTOPEXY;  Surgeon: Stark Klein, MD;  Location: [place];  Service: General;  Laterality: Right;   RE-EXCISION OF BREAST LUMPECTOMY Right 10/31/2019   Procedure: RIGHT RE-EXCISION OF BREAST LUMPECTOMY;  Surgeon: Stark Klein, MD;  Location: [place];  Service: General;  Laterality: Right;   TOTAL LAPAROSCOPIC HYSTERECTOMY WITH BILATERAL SALPINGO OOPHORECTOMY N/A 01/16/2020   Procedure: TOTAL LAPAROSCOPIC HYSTERECTOMY WITH BILATERAL SALPINGO OOPHORECTOMY/COLLECTION OF PELVIC WASHINGS, LYSIS OF ADHESIONS;  Surgeon: Nunzio Cobbs, MD;  Location: [facility];  Service: Gynecology;  Laterality: N/A;  collection of pelvic washings   TUBAL LIGATION      FAMILY HISTORY: Family History  Problem Relation Age of Onset   Hypertension Father    Stomach cancer Father        may have been colon, diagnosed in his 75s   Depression Mother    Dementia Mother    Thyroid disease Sister    Heart disease Sister    Brain cancer Maternal Aunt 34   Cancer Maternal Grandmother        undetermined type, diagnosed in her 33s   Bone cancer Paternal Grandfather 65   Uterine cancer Paternal Aunt 54   Uterine cancer  Cousin        paternal 1st cousin, diagnosed in her late 67s   Uterine cancer Cousin 44       paternal 34st cousin  Patient's father was 44 years old when he died.  He had "stomach cancer", she thinks possibly colon.  Patient's mother died from complications from a fall at age 61. The patient denies a family hx of breast or ovarian cancer. She had no brothers, 2 sisters, 1 of whom died from sepsis.  1 grandmother, a former Armed forces logistics/support/administrative officer, died from what may have been lung cancer.  She also reports uterine cancer in a cousin and possibly an aunt.   GYNECOLOGIC HISTORY:  Patient's last menstrual period was 09/01/2007 (exact date). Menarche: 65 years old Age at first live birth: 65 years old (the patient has a retroverted uterus, likely explaining her difficulty in getting pregnant) [place] P 1 LMP around age 64 Contraceptive used from [id number], no complications HRT used for 5 years, 2013 until 01 /5537 , no complications Hysterectomy? Yes, 12/2019 BSO? 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  Her husband Coralyn Pear is a Librarian, academic for an Geophysicist/field seismologist. She lives at home with husband Daryl.  They have  some horses.  Son Luisa Hart married in 2022 and works also as a Librarian, academic for Copywriter, advertising.   ADVANCED DIRECTIVES: In the absence of any documentation to the contrary, the patient's spouse is their HCPOA.   HEALTH MAINTENANCE: Social History   Tobacco Use   Smoking status: Former    Types: Cigarettes    Quit date: 03/15/1996    Years since quitting: 25.3   Smokeless tobacco: Never  Vaping Use   Vaping Use: Never used  Substance Use Topics   Alcohol use: Yes    Alcohol/week: 0.0 standard drinks    Comment: occ wine   Drug use: No     Colonoscopy: 2022/[facility]  PAP: 06/2019, negative  Bone density: never done   Allergies  Allergen Reactions   Flagyl [Metronidazole]     C-Diff   Other     Dermabond - redness and burning,  blistering   Adhesive [Tape] Rash    "burning" skin    Current Outpatient Medications  Medication Sig Dispense Refill   Ascorbic Acid (VITAMIN C) 100 MG tablet Take 100 mg by mouth daily.     Cholecalciferol (VITAMIN D) 10 MCG/ML LIQD Take 1 drop by mouth in the morning and at bedtime.      CRANBERRY PO Take 1 tablet by mouth in the morning and at bedtime.      gabapentin (NEURONTIN) 300 MG capsule Take 1 capsule (300 mg total) by mouth at bedtime. 90 capsule 4   hydrocortisone 2.5 % cream Apply topically 3 (three) times daily as needed. 28 g 3   Lactobacillus Rhamnosus, GG, (CULTURELLE PO) Take 1 capsule by mouth daily.      loperamide (IMODIUM) 2 MG capsule Take 2 mg by mouth in the morning and at bedtime.      Magnesium 100 MG TABS Take 100 mg by mouth daily.      Melatonin 10 MG CAPS Take 10 mg by mouth at bedtime.      Pumpkin Seed-Soy Germ (AZO BLADDER CONTROL/GO-LESS PO) Take 1 tablet by mouth in the morning and at bedtime.      QUERCETIN PO Take 1 tablet by mouth in the morning and at bedtime.     tamoxifen (NOLVADEX) 20 MG tablet Take 1 tablet (20 mg total) by mouth daily. 90 tablet 4   TURMERIC PO Take 5 mLs by mouth daily.     valsartan (DIOVAN) 40 MG tablet Take 40 mg by mouth at bedtime.     venlafaxine XR (EFFEXOR-XR) 37.5 MG 24 hr capsule Take 1 capsule (37.5 mg total) by mouth daily. 90 capsule 4   vitamin E 180 MG (400 UNITS) capsule Take 400 Units by mouth daily.     No current facility-administered medications for this visit.    OBJECTIVE:  white woman who appears younger than stated age  40:   07/01/21 1120  BP: 129/74  Pulse: 76  Resp: 18  Temp: 98.4 F (36.9 C)  SpO2: 98%      Body mass index is 26.16 kg/m.   Wt Readings from Last 3 Encounters:  07/01/21 152 lb 6.4 oz (69.1 kg)  12/09/20 153 lb 8 oz (69.6 kg)  06/27/20 150 lb (68 kg)      ECOG FS:1 - Symptomatic but completely ambulatory  Sclerae unicteric, EOMs intact Wearing a mask No  cervical or supraclavicular adenopathy Lungs no rales or rhonchi Heart regular rate and rhythm Abd soft, nontender, positive bowel sounds MSK no focal spinal tenderness, no upper extremity  lymphedema Neuro: nonfocal, well oriented, appropriate affect Breasts: Status post right lumpectomy and radiation with no evidence of disease recurrence.  Left breast and both axillae are benign.   LAB RESULTS:  CMP     Component Value Date/Time   NA 137 07/01/2021 1102   K 4.7 07/01/2021 1102   CL 102 07/01/2021 1102   CO2 27 07/01/2021 1102   GLUCOSE 98 07/01/2021 1102   BUN 13 07/01/2021 1102   CREATININE 0.73 07/01/2021 1102   CALCIUM 9.4 07/01/2021 1102   PROT 7.1 07/01/2021 1102   ALBUMIN 4.3 07/01/2021 1102   AST 15 07/01/2021 1102   ALT 15 07/01/2021 1102   ALKPHOS 38 07/01/2021 1102   BILITOT 0.3 07/01/2021 1102   GFRNONAA >60 07/01/2021 1102   GFRAA >60 03/08/2020 1001    No results found for: TOTALPROTELP, ALBUMINELP, A1GS, A2GS, BETS, BETA2SER, GAMS, MSPIKE, SPEI  No results found for: KPAFRELGTCHN, LAMBDASER, KAPLAMBRATIO  Lab Results  Component Value Date   WBC 6.8 07/01/2021   NEUTROABS 3.7 07/01/2021   HGB 12.4 07/01/2021   HCT 36.8 07/01/2021   MCV 93.4 07/01/2021   PLT 221 07/01/2021    No results found for: LABCA2  No components found for: ZXYOFV886  No results for input(s): INR in the last 168 hours.  No results found for: LABCA2  No results found for: CAN199  Lab Results  Component Value Date   CAN125 7.8 07/06/2019    No results found for: LRJ736  No results found for: CA2729  No components found for: HGQUANT  No results found for: CEA1 / No results found for: CEA1   No results found for: AFPTUMOR  No results found for: CHROMOGRNA  No results found for: HGBA, HGBA2QUANT, HGBFQUANT, HGBSQUAN (Hemoglobinopathy evaluation)   No results found for: LDH  No results found for: IRON, TIBC, IRONPCTSAT (Iron and TIBC)  No results found  for: FERRITIN  Urinalysis    Component Value Date/Time   BILIRUBINUR n 05/15/2016 0902   PROTEINUR n 05/15/2016 0902   UROBILINOGEN negative 05/15/2016 0902   NITRITE n 05/15/2016 0902   LEUKOCYTESUR moderate (2+) (A) 05/15/2016 0902    STUDIES: MM DIAG BREAST TOMO BILATERAL  Result Date: 06/18/2021 CLINICAL DATA:  65 year old female status post malignant right lumpectomy with radiation in 2021. EXAM: DIGITAL DIAGNOSTIC BILATERAL MAMMOGRAM WITH TOMOSYNTHESIS AND CAD TECHNIQUE: Bilateral digital diagnostic mammography and breast tomosynthesis was performed. The images were evaluated with computer-aided detection. COMPARISON:  Previous exam(s). ACR Breast Density Category c: The breast tissue is heterogeneously dense, which may obscure small masses. FINDINGS: Stable post lumpectomy changes noted on the right. No new or suspicious findings in either breast. IMPRESSION: 1. No mammographic evidence of malignancy in either breast. 2. Stable right breast posttreatment changes. RECOMMENDATION: Diagnostic mammogram is suggested in 1 year. (Code:DM-B-01Y) I have discussed the findings and recommendations with the patient. If applicable, a reminder letter will be sent to the patient regarding the next appointment. BI-RADS CATEGORY  2: Benign. Electronically Signed   By: Kristopher Oppenheim M.D.   On: 06/18/2021 12:07     ELIGIBLE FOR AVAILABLE RESEARCH PROTOCOL: no  ASSESSMENT: 65 y.o. [place] woman status post right lower inner quadrant biopsy 07/03/2019 for a clinicaly multiple T1c N0, stage IA invasive ductal carcinoma, grade 2, E-cadherin positive, strongly estrogen receptor positive but progesterone receptor negative and HER-2 not amplified, with an MIB-1 of 5%  (a) breast MRI 08/07/2019 showed 2 additional suspicious areas in the right breast and 1 in  the left breast   (b) biopsy of all 3 areas 08/21/2019 showed no malignancy  (1) tamoxifen started neoadjuvantly 07/26/2019  (2) right lumpectomy  and sentinel lymph node sampling 10/04/2019 showed a pT2 pN1, stage IIB invasive ductal carcinoma, grade 2, with positive lymphovascular invasion and a positive superior margin  (a) 3 sentinel lymph nodes removed, one with macro metastatic deposit, 1 with a micrometastatic deposit  (b) additional surgery for margin clearance   (3) MammaPrint on the 10/04/2019 sample shows a low risk luminal A tumor predicting a 5-year metastasis free survival of 96% without chemotherapy, and a chemotherapy benefit of less than 1.5%  (4) adjuvant radiation 11/27/2019 through 01/05/2020 Site Technique Total Dose (Gy) Dose per Fx (Gy) Completed Fx Beam Energies  Breast, Right: Breast_Rt 3D 50/50 2 25/25 6X, 10X  Breast, Right: Breast_Rt_SCV_PAB 3D 50/50 2 25/25 6X, 10X  Breast, Right: Breast_Rt_Bst 3D 10/10 2 5/5 6X, 10X   (5) genetics testing 08/04/2019 through the STAT Breast cancer panel offered by [facility] found no deleterious mutations in ATM, BRCA1, BRCA2, CDH1, CHEK2, PALB2, PTEN, STK11 and TP53.  The Common Hereditary Cancers Panel offered by [facility] includes sequencing and/or deletion duplication testing of the following 48 genes: APC, ATM, AXIN2, BARD1, BMPR1A, BRCA1, BRCA2, BRIP1, CDH1, CDK4, CDKN2A (p14ARF), CDKN2A (p16INK4a), CHEK2, CTNNA1, DICER1, EPCAM (Deletion/duplication testing only), GREM1 (promoter region deletion/duplication testing only), KIT, MEN1, MLH1, MSH2, MSH3, MSH6, MUTYH, NBN, NF1, NHTL1, PALB2, PDGFRA, PMS2, POLD1, POLE, PTEN, RAD50, RAD51C, RAD51D, RNF43, SDHB, SDHC, SDHD, SMAD4, SMARCA4. STK11, TP53, TSC1, TSC2, and VHL.  The following genes were evaluated for sequence changes only: SDHA and HOXB13 c.251G>A variant only.   (6) tamoxifen started November 2020: to be continued for 10 years  (a) status post hysterectomy and bilateral salpingo-oophorectomy 01/16/2020 with benign pathology   PLAN: Shequila is coming up on 2 years from definitive surgery for her breast cancer with no evidence of  disease recurrence.  This is favorable.  She is tolerating tamoxifen generally well.  I think she might do better if we increase the venlafaxine to 75 mg daily and she will try that for the next 2 or 3 weeks.  If she finds it makes a significant difference we will change the dose.  Otherwise she will go back to 37.5.  Incidentally I cautioned her not to stop the venlafaxine abruptly.  If she wants to come off that medication she should ask [place] for taper  We will see her again in 1 year, right after her mammogram.  She knows to call for any other issue that may develop before then  Total encounter time 25 minutes.Chauncey Cruel, MD   07/01/2021 11:50 AM Medical Oncology and Hematology [facility] [place], [place] [address] Tel. [phone]    Fax. [phone]   This document serves as a record of services personally performed by Lurline Del, MD. It was created on his behalf by Wilburn Mylar, a trained medical scribe. The creation of this record is based on the scribe's personal observations and the provider's statements to them.   I, Lurline Del MD, have reviewed the above documentation for accuracy and completeness, and I agree with the above.   *Total Encounter Time as defined by the Centers for Medicare and Medicaid Services includes, in addition to the face-to-face time of a patient visit (documented in the note above) non-face-to-face time: obtaining and reviewing outside history, ordering and reviewing medications, tests or procedures, care coordination (  communications with other health care professionals or caregivers) and documentation in the medical record.

## 2021-07-01 ENCOUNTER — Other Ambulatory Visit: Payer: Self-pay

## 2021-07-01 ENCOUNTER — Inpatient Hospital Stay: Payer: Commercial Managed Care - PPO | Attending: Oncology

## 2021-07-01 ENCOUNTER — Inpatient Hospital Stay (HOSPITAL_BASED_OUTPATIENT_CLINIC_OR_DEPARTMENT_OTHER): Payer: Commercial Managed Care - PPO | Admitting: Oncology

## 2021-07-01 VITALS — BP 129/74 | HR 76 | Temp 98.4°F | Resp 18 | Ht 64.0 in | Wt 152.4 lb

## 2021-07-01 DIAGNOSIS — Z170 Estrogen receptor positive status [ER+]: Secondary | ICD-10-CM | POA: Diagnosis not present

## 2021-07-01 DIAGNOSIS — Z79899 Other long term (current) drug therapy: Secondary | ICD-10-CM | POA: Diagnosis not present

## 2021-07-01 DIAGNOSIS — Z79810 Long term (current) use of selective estrogen receptor modulators (SERMs): Secondary | ICD-10-CM | POA: Diagnosis not present

## 2021-07-01 DIAGNOSIS — C50311 Malignant neoplasm of lower-inner quadrant of right female breast: Secondary | ICD-10-CM | POA: Insufficient documentation

## 2021-07-01 DIAGNOSIS — Z90710 Acquired absence of both cervix and uterus: Secondary | ICD-10-CM | POA: Diagnosis not present

## 2021-07-01 DIAGNOSIS — Z923 Personal history of irradiation: Secondary | ICD-10-CM | POA: Insufficient documentation

## 2021-07-01 DIAGNOSIS — Z90722 Acquired absence of ovaries, bilateral: Secondary | ICD-10-CM | POA: Diagnosis not present

## 2021-07-01 DIAGNOSIS — I10 Essential (primary) hypertension: Secondary | ICD-10-CM | POA: Diagnosis not present

## 2021-07-01 DIAGNOSIS — Z9079 Acquired absence of other genital organ(s): Secondary | ICD-10-CM | POA: Diagnosis not present

## 2021-07-01 DIAGNOSIS — E119 Type 2 diabetes mellitus without complications: Secondary | ICD-10-CM | POA: Diagnosis not present

## 2021-07-01 DIAGNOSIS — C773 Secondary and unspecified malignant neoplasm of axilla and upper limb lymph nodes: Secondary | ICD-10-CM | POA: Insufficient documentation

## 2021-07-01 LAB — CBC WITH DIFFERENTIAL (CANCER CENTER ONLY)
Abs Immature Granulocytes: 0.02 10*3/uL (ref 0.00–0.07)
Basophils Absolute: 0.1 10*3/uL (ref 0.0–0.1)
Basophils Relative: 1 %
Eosinophils Absolute: 0.1 10*3/uL (ref 0.0–0.5)
Eosinophils Relative: 1 %
HCT: 36.8 % (ref 36.0–46.0)
Hemoglobin: 12.4 g/dL (ref 12.0–15.0)
Immature Granulocytes: 0 %
Lymphocytes Relative: 33 %
Lymphs Abs: 2.3 10*3/uL (ref 0.7–4.0)
MCH: 31.5 pg (ref 26.0–34.0)
MCHC: 33.7 g/dL (ref 30.0–36.0)
MCV: 93.4 fL (ref 80.0–100.0)
Monocytes Absolute: 0.6 10*3/uL (ref 0.1–1.0)
Monocytes Relative: 9 %
Neutro Abs: 3.7 10*3/uL (ref 1.7–7.7)
Neutrophils Relative %: 56 %
Platelet Count: 221 10*3/uL (ref 150–400)
RBC: 3.94 MIL/uL (ref 3.87–5.11)
RDW: 12.4 % (ref 11.5–15.5)
WBC Count: 6.8 10*3/uL (ref 4.0–10.5)
nRBC: 0 % (ref 0.0–0.2)

## 2021-07-01 LAB — CMP (CANCER CENTER ONLY)
ALT: 15 U/L (ref 0–44)
AST: 15 U/L (ref 15–41)
Albumin: 4.3 g/dL (ref 3.5–5.0)
Alkaline Phosphatase: 38 U/L (ref 38–126)
Anion gap: 8 (ref 5–15)
BUN: 13 mg/dL (ref 8–23)
CO2: 27 mmol/L (ref 22–32)
Calcium: 9.4 mg/dL (ref 8.9–10.3)
Chloride: 102 mmol/L (ref 98–111)
Creatinine: 0.73 mg/dL (ref 0.44–1.00)
GFR, Estimated: >60 mL/min (ref >60)
Glucose, Bld: 98 mg/dL (ref 70–99)
Potassium: 4.7 mmol/L (ref 3.5–5.1)
Sodium: 137 mmol/L (ref 135–145)
Total Bilirubin: 0.3 mg/dL (ref 0.3–1.2)
Total Protein: 7.1 g/dL (ref 6.5–8.1)

## 2021-07-01 MED ORDER — GABAPENTIN 300 MG PO CAPS: 300.0000 mg | ORAL_CAPSULE | Freq: Every day | ORAL | 4 refills | Status: DC

## 2021-07-01 MED ORDER — TAMOXIFEN CITRATE 20 MG PO TABS: 20.0000 mg | ORAL_TABLET | Freq: Every day | ORAL | 4 refills | Status: DC

## 2021-07-03 NOTE — Progress Notes (Signed)
65 y.o. G47P1001 Married Caucasian female here for annual exam.    Some suprapubic irritation after riding horse.   Using Replens for vaginal dryness.  Seeing urology for post coital UTI.  Using D mannose.  She would like an Rx for triamcinolone ointment 0.025% for the vulva.  Uses on occasion.  PCP:  [name], MD   Patient's last menstrual period was 09/01/2007 (exact date).           Sexually active: Yes.    The current method of family planning is tubal ligation/Hyst.    Exercising: Yes.     14,000 to 16,000 steps a day Smoker:  no  Health Maintenance: Pap:  Former History of abnormal Pap:  no MMG:  06-18-21 Diag.Bil/Neg/biRads2/Diag.in 60yr Colonoscopy:  02-10-21 polyps;next 3 years BMD:   n/a  Result  n/a TDaP:  PCP Gardasil:   no NTI:RWERXV Hep C:UnsurPCP Screening Labs:  PCP.  Flu vaccine:  she will at her pharmacy.     reports that she quit smoking about 25 years ago. Her smoking use included cigarettes. She has never used smokeless tobacco. She reports current alcohol use. She reports that she does not use drugs.  Past Medical History:  Diagnosis Date   Breast cancer, right ([name])    Contact lens/glasses fitting    wears contacts or glasses   Diarrhea    --post gallbladder surgery   Family history of bone cancer    Family history of brain cancer    Family history of stomach cancer    Family history of uterine cancer    Fibroids    History of radiation therapy    Hypertension    No pertinent past medical history    Personal history of radiation therapy    PONV (postoperative nausea and vomiting)    Right ovarian cyst    Vertigo     Past Surgical History:  Procedure Laterality Date   BLADDER SUSPENSION     BREAST BIOPSY  09/08/2012   Procedure: BREAST BIOPSY;  Surgeon: Odis Hollingshead, MD;  Location: [place];  Service: General;  Laterality: Left;  remove left breast mass   BREAST LUMPECTOMY Right 10/2019   BREAST LUMPECTOMY WITH  RADIOACTIVE SEED AND SENTINEL LYMPH NODE BIOPSY Right 10/04/2019   Procedure: RIGHT BREAST LUMPECTOMY WITH BRACKETED RADIOACTIVE SEEDS, RIGHT BREAST RADIOACTIVE SEED GUIDED EXCISION BIOPSY, AND RIGHT SENTINEL LYMPH NODE BIOPSY;  Surgeon: Stark Klein, MD;  Location: [place];  Service: General;  Laterality: Right;   BREAST SURGERY     lumpectomy x2   BUNIONECTOMY Right 01/2016   CHOLECYSTECTOMY     COLONOSCOPY     CYSTOSCOPY N/A 01/16/2020   Procedure: CYSTOSCOPY;  Surgeon: Nunzio Cobbs, MD;  Location: [facility];  Service: Gynecology;  Laterality: N/A;   MASTOPEXY Right 10/04/2019   Procedure: RIGHT BREAST MASTOPEXY;  Surgeon: Stark Klein, MD;  Location: [place];  Service: General;  Laterality: Right;   RE-EXCISION OF BREAST LUMPECTOMY Right 10/31/2019   Procedure: RIGHT RE-EXCISION OF BREAST LUMPECTOMY;  Surgeon: Stark Klein, MD;  Location: [place];  Service: General;  Laterality: Right;   TOTAL LAPAROSCOPIC HYSTERECTOMY WITH BILATERAL SALPINGO OOPHORECTOMY N/A 01/16/2020   Procedure: TOTAL LAPAROSCOPIC HYSTERECTOMY WITH BILATERAL SALPINGO OOPHORECTOMY/COLLECTION OF PELVIC WASHINGS, LYSIS OF ADHESIONS;  Surgeon: Nunzio Cobbs, MD;  Location: [facility];  Service: Gynecology;  Laterality: N/A;  collection of pelvic washings  TUBAL LIGATION      Current Outpatient Medications  Medication Sig Dispense Refill   Ascorbic Acid (VITAMIN C) 100 MG tablet Take 100 mg by mouth daily.     Cholecalciferol (VITAMIN D) 10 MCG/ML LIQD Take 1 drop by mouth in the morning and at bedtime.      CRANBERRY PO Take 1 tablet by mouth in the morning and at bedtime.      gabapentin (NEURONTIN) 300 MG capsule Take 1 capsule (300 mg total) by mouth at bedtime. 90 capsule 4   hydrocortisone 2.5 % cream Apply topically 3 (three) times daily as needed. 28 g 3   Lactobacillus Rhamnosus, GG, (CULTURELLE PO) Take 1  capsule by mouth daily.      loperamide (IMODIUM) 2 MG capsule Take 2 mg by mouth in the morning and at bedtime.      Magnesium 100 MG TABS Take 100 mg by mouth daily.      Melatonin 10 MG CAPS Take 10 mg by mouth at bedtime.      Pumpkin Seed-Soy Germ (AZO BLADDER CONTROL/GO-LESS PO) Take 1 tablet by mouth in the morning and at bedtime.      QUERCETIN PO Take 1 tablet by mouth in the morning and at bedtime.     tamoxifen (NOLVADEX) 20 MG tablet Take 1 tablet (20 mg total) by mouth daily. 90 tablet 4   TURMERIC PO Take 5 mLs by mouth daily.     valsartan (DIOVAN) 40 MG tablet Take by mouth.     venlafaxine XR (EFFEXOR-XR) 37.5 MG 24 hr capsule Take 1 capsule (37.5 mg total) by mouth daily. 90 capsule 4   vitamin E 180 MG (400 UNITS) capsule Take 400 Units by mouth daily.     No current facility-administered medications for this visit.    Family History  Problem Relation Age of Onset   Hypertension Father    Stomach cancer Father        may have been colon, diagnosed in his 37s   Depression Mother    Dementia Mother    Thyroid disease Sister    Heart disease Sister    Brain cancer Maternal Aunt 43   Cancer Maternal Grandmother        undetermined type, diagnosed in her 3s   Bone cancer Paternal Grandfather 43   Uterine cancer Paternal Aunt 47   Uterine cancer Cousin        paternal 1st cousin, diagnosed in her late 23s   Uterine cancer Cousin 64       paternal 1st cousin    Review of Systems  All other systems reviewed and are negative.  Exam:   BP 106/64   Pulse 82   Ht 5\' 4"  (1.626 m)   Wt 156 lb (70.8 kg)   LMP 09/01/2007 (Exact Date)   SpO2 99%   BMI 26.78 kg/m     General appearance: alert, cooperative and appears stated age Head: normocephalic, without obvious abnormality, atraumatic Neck: no adenopathy, supple, symmetrical, trachea midline and thyroid normal to inspection and palpation Lungs: clear to auscultation bilaterally Breasts: right - scar and  radiation change note, no masses or tenderness, No nipple discharge or bleeding, No axillary adenopathy Left - normal appearance, no masses or tenderness, No nipple retraction or dimpling, No nipple discharge or bleeding, No axillary adenopathy Heart: regular rate and rhythm Abdomen: soft, non-tender; no masses, no organomegaly Extremities: extremities normal, atraumatic, no cyanosis or edema Skin: skin color, texture, turgor normal. No  rashes or lesions Lymph nodes: cervical, supraclavicular, and axillary nodes normal. Neurologic: grossly normal  Pelvic: External genitalia:  suprapubic excoriations.               No abnormal inguinal nodes palpated.              Urethra:  normal appearing urethra with no masses, tenderness or lesions              Bartholins and Skenes: normal                 Vagina: normal appearing vagina with normal color and discharge, no lesions              Cervix: absent              Pap taken: no Bimanual Exam:  Uterus:  absent              Adnexa: no mass, fullness, tenderness              Rectal exam: yes.  Confirms.              Anus:  normal sphincter tone, no lesions  Chaperone was present for exam:  Estill Bamberg, CMA  Assessment:   Well woman visit with gynecologic exam. Status post total laparoscopic hysterectomy with BSO.     Right breast cancer.  On Tamoxifen.  Plan: Mammogram screening discussed. Self breast awareness reviewed. Pap and HR HPV as above. Guidelines for Calcium, Vitamin D, regular exercise program including cardiovascular and weight bearing exercise. I discussed vaginal vit E suppositories for atrophy.  Rx triamcinolone ointment  0.25% bid for 1 week prn. BMD.  See dermatology first of the year for skin check. Follow up annually and prn.   After visit summary provided.

## 2021-07-07 ENCOUNTER — Other Ambulatory Visit: Payer: Self-pay

## 2021-07-07 ENCOUNTER — Ambulatory Visit: Payer: Commercial Managed Care - PPO | Admitting: Obstetrics and Gynecology

## 2021-07-07 ENCOUNTER — Ambulatory Visit (INDEPENDENT_AMBULATORY_CARE_PROVIDER_SITE_OTHER): Payer: Commercial Managed Care - PPO | Admitting: Obstetrics and Gynecology

## 2021-07-07 ENCOUNTER — Encounter: Payer: Self-pay | Admitting: Obstetrics and Gynecology

## 2021-07-07 VITALS — BP 106/64 | HR 82 | Ht 64.0 in | Wt 156.0 lb

## 2021-07-07 DIAGNOSIS — Z01419 Encounter for gynecological examination (general) (routine) without abnormal findings: Secondary | ICD-10-CM | POA: Diagnosis not present

## 2021-07-07 DIAGNOSIS — Z780 Asymptomatic menopausal state: Secondary | ICD-10-CM

## 2021-07-07 MED ORDER — TRIAMCINOLONE ACETONIDE 0.025 % EX OINT: 1.0000 "application " | TOPICAL_OINTMENT | Freq: Two times a day (BID) | CUTANEOUS | 0 refills | Status: DC

## 2021-07-07 NOTE — Patient Instructions (Signed)

## 2021-07-16 ENCOUNTER — Other Ambulatory Visit: Payer: Self-pay

## 2021-07-16 ENCOUNTER — Other Ambulatory Visit: Payer: Self-pay | Admitting: Obstetrics and Gynecology

## 2021-07-16 ENCOUNTER — Ambulatory Visit (INDEPENDENT_AMBULATORY_CARE_PROVIDER_SITE_OTHER): Payer: Commercial Managed Care - PPO

## 2021-07-16 DIAGNOSIS — Z780 Asymptomatic menopausal state: Secondary | ICD-10-CM

## 2021-07-16 DIAGNOSIS — Z13820 Encounter for screening for osteoporosis: Secondary | ICD-10-CM

## 2021-12-10 ENCOUNTER — Other Ambulatory Visit: Payer: Commercial Managed Care - PPO

## 2021-12-10 ENCOUNTER — Other Ambulatory Visit: Payer: Self-pay

## 2021-12-10 ENCOUNTER — Ambulatory Visit: Payer: Commercial Managed Care - PPO | Admitting: Hematology and Oncology

## 2021-12-10 ENCOUNTER — Inpatient Hospital Stay (HOSPITAL_BASED_OUTPATIENT_CLINIC_OR_DEPARTMENT_OTHER): Payer: Commercial Managed Care - PPO | Admitting: Hematology and Oncology

## 2021-12-10 ENCOUNTER — Encounter: Payer: Self-pay | Admitting: Hematology and Oncology

## 2021-12-10 ENCOUNTER — Inpatient Hospital Stay: Payer: Commercial Managed Care - PPO | Attending: Hematology and Oncology

## 2021-12-10 VITALS — BP 126/81 | HR 85 | Temp 97.7°F | Resp 16 | Ht 64.0 in | Wt 153.9 lb

## 2021-12-10 DIAGNOSIS — Z79810 Long term (current) use of selective estrogen receptor modulators (SERMs): Secondary | ICD-10-CM | POA: Diagnosis not present

## 2021-12-10 DIAGNOSIS — Z79899 Other long term (current) drug therapy: Secondary | ICD-10-CM | POA: Diagnosis not present

## 2021-12-10 DIAGNOSIS — Z923 Personal history of irradiation: Secondary | ICD-10-CM | POA: Insufficient documentation

## 2021-12-10 DIAGNOSIS — Z170 Estrogen receptor positive status [ER+]: Secondary | ICD-10-CM | POA: Diagnosis not present

## 2021-12-10 DIAGNOSIS — C50311 Malignant neoplasm of lower-inner quadrant of right female breast: Secondary | ICD-10-CM | POA: Insufficient documentation

## 2021-12-10 LAB — CBC WITH DIFFERENTIAL (CANCER CENTER ONLY)
Abs Immature Granulocytes: 0.01 10*3/uL (ref 0.00–0.07)
Basophils Absolute: 0.1 10*3/uL (ref 0.0–0.1)
Basophils Relative: 1 %
Eosinophils Absolute: 0.1 10*3/uL (ref 0.0–0.5)
Eosinophils Relative: 2 %
HCT: 36.2 % (ref 36.0–46.0)
Hemoglobin: 12 g/dL (ref 12.0–15.0)
Immature Granulocytes: 0 %
Lymphocytes Relative: 39 %
Lymphs Abs: 2.9 10*3/uL (ref 0.7–4.0)
MCH: 30.5 pg (ref 26.0–34.0)
MCHC: 33.1 g/dL (ref 30.0–36.0)
MCV: 92.1 fL (ref 80.0–100.0)
Monocytes Absolute: 0.6 10*3/uL (ref 0.1–1.0)
Monocytes Relative: 8 %
Neutro Abs: 3.7 10*3/uL (ref 1.7–7.7)
Neutrophils Relative %: 50 %
Platelet Count: 162 10*3/uL (ref 150–400)
RBC: 3.93 MIL/uL (ref 3.87–5.11)
RDW: 12.9 % (ref 11.5–15.5)
WBC Count: 7.3 10*3/uL (ref 4.0–10.5)
nRBC: 0 % (ref 0.0–0.2)

## 2021-12-10 LAB — CMP (CANCER CENTER ONLY)
ALT: 13 U/L (ref 0–44)
AST: 20 U/L (ref 15–41)
Albumin: 4.4 g/dL (ref 3.5–5.0)
Alkaline Phosphatase: 53 U/L (ref 38–126)
Anion gap: 7 (ref 5–15)
BUN: 13 mg/dL (ref 8–23)
CO2: 26 mmol/L (ref 22–32)
Calcium: 9.5 mg/dL (ref 8.9–10.3)
Chloride: 104 mmol/L (ref 98–111)
Creatinine: 0.75 mg/dL (ref 0.44–1.00)
GFR, Estimated: >60 mL/min (ref >60)
Glucose, Bld: 104 mg/dL — ABNORMAL HIGH (ref 70–99)
Potassium: 4.4 mmol/L (ref 3.5–5.1)
Sodium: 137 mmol/L (ref 135–145)
Total Bilirubin: 0.3 mg/dL (ref 0.3–1.2)
Total Protein: 7 g/dL (ref 6.5–8.1)

## 2021-12-10 NOTE — Progress Notes (Signed)
?[place]  ?Telephone:([phone]) [phone] [phone]  ? ? ? ?ID: Theresa [name] DOB: Nov 09, 1955  MR#: [id number]  EML#:[id number] ? ?Patient Care Team: ?Theresa Noble, MD as PCP - General (Internal Medicine) ?Theresa Kaufmann, RN as Oncology Nurse Navigator ?Theresa Germany, RN as Oncology Nurse Navigator ?Theresa Klein, MD as Consulting Physician (General Surgery) ?Theresa Gibson, MD as Attending Physician (Radiation Oncology) ?Theresa Cobbs, MD as Consulting Physician (Obstetrics and Gynecology) ?Theresa Bookbinder, MD as Consulting Physician (Dermatology) ?Theresa Hughs, MD as Consulting Physician (Urology) ?Theresa Craver, MD as Consulting Physician (Gastroenterology) ?Theresa Butters, MD as Attending Physician (Orthopedic Surgery) ?Theresa Pike, MD as Consulting Physician (Hematology and Oncology) ?Theresa Pike, MD ?OTHER MD: ? ?CHIEF COMPLAINT: Estrogen receptor positive breast cancer ? ?CURRENT TREATMENT: Tamoxifen ? ?INTERVAL HISTORY: ?Theresa [name] returns today for follow up of her estrogen receptor positive breast cancer.  ?She continues on tamoxifen.  ?She continues to have hot flashes which seems to be her most significant side effect.  She says hot flashes are certainly better at night with gabapentin but she has pretty much throughout the day.  Besides hot flashes she denies any other adverse effects.  She otherwise tolerates tamoxifen quite well.  She has not noticed any changes in her breast. ? ?Last mammogram October 2022 with no mammographic evidence of malignancy in either breast.  Last bone density in November 2022 normal.  Rest of the pertinent 10 point ROS reviewed and negative ? ?REVIEW OF SYSTEMS: ?Theresa [name] rides horses 5 days a week, and gets about 14,000 steps most days.  ? ? 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 ? ?HISTORY OF CURRENT ILLNESS: ?From the original intake note: ? ?Aella has a history of prior left breast excision on 09/08/2012. Pathology from this  ([id number]) showed: cyst with features of rupture and associated fat necrosis; no evidence of malignancy. ? ?She had routine screening mammography on 06/14/2019 showing a possible abnormality in the right breast. She then underwent right diagnostic mammography with tomography and right breast ultrasonography at [facility] on 06/21/2019 showing: breast density category C; 1.1 cm irregular mass in the right breast at 3:30.; adjacent smaller 6 mm mass in the right breast at 3:30; no suspicious right axillary lymph nodes. ? ?Accordingly on 07/03/2019 she proceeded to biopsy of the right breast areas in question. The pathology from this procedure ([id number]) showed: invasive mammary carcinoma, grade 2, e-cadherin positive. Prognostic indicators significant for: estrogen receptor, 95% positive with strong staining intensity, progesterone receptor, 0% negative. Proliferation marker Ki67 at 5%. HER2 equivocal by immunohistochemistry (2+), but negative by fluorescent in situ hybridization with a signals ratio 1.58 and number per cell 2.60. ? ?The patient's subsequent history is as detailed below. ? ? ?PAST MEDICAL HISTORY: ?Past Medical History:  ?Diagnosis Date  ? Breast cancer, right ([place])   ? Contact lens/glasses fitting   ? wears contacts or glasses  ? Diarrhea   ? --post gallbladder surgery  ? Family history of bone cancer   ? Family history of brain cancer   ? Family history of stomach cancer   ? Family history of uterine cancer   ? Fibroids   ? History of radiation therapy   ? Hypertension   ? No pertinent past medical history   ? Personal history of radiation therapy   ? PONV (postoperative nausea and vomiting)   ? Right ovarian cyst   ? 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 ? ? ?PAST SURGICAL  HISTORY: ?Past Surgical History:  ?Procedure Laterality Date  ? BLADDER SUSPENSION    ? BREAST BIOPSY  09/08/2012  ? Procedure: BREAST BIOPSY;  Surgeon: Odis Hollingshead, MD;  Location: [place];  Service:  General;  Laterality: Left;  remove left breast mass  ? BREAST LUMPECTOMY Right 10/2019  ? BREAST LUMPECTOMY WITH RADIOACTIVE SEED AND SENTINEL LYMPH NODE BIOPSY Right 10/04/2019  ? Procedure: RIGHT BREAST LUMPECTOMY WITH BRACKETED RADIOACTIVE SEEDS, RIGHT BREAST RADIOACTIVE SEED GUIDED EXCISION BIOPSY, AND RIGHT SENTINEL LYMPH NODE BIOPSY;  Surgeon: Theresa Klein, MD;  Location: [place];  Service: General;  Laterality: Right;  ? BREAST SURGERY    ? lumpectomy x2  ? BUNIONECTOMY Right 01/2016  ? CHOLECYSTECTOMY    ? COLONOSCOPY    ? CYSTOSCOPY N/A 01/16/2020  ? Procedure: CYSTOSCOPY;  Surgeon: Theresa Cobbs, MD;  Location: [facility];  Service: Gynecology;  Laterality: N/A;  ? MASTOPEXY Right 10/04/2019  ? Procedure: RIGHT BREAST MASTOPEXY;  Surgeon: Theresa Klein, MD;  Location: [place];  Service: General;  Laterality: Right;  ? RE-EXCISION OF BREAST LUMPECTOMY Right 10/31/2019  ? Procedure: RIGHT RE-EXCISION OF BREAST LUMPECTOMY;  Surgeon: Theresa Klein, MD;  Location: [place];  Service: General;  Laterality: Right;  ? TOTAL LAPAROSCOPIC HYSTERECTOMY WITH BILATERAL SALPINGO OOPHORECTOMY N/A 01/16/2020  ? Procedure: TOTAL LAPAROSCOPIC HYSTERECTOMY WITH BILATERAL SALPINGO OOPHORECTOMY/COLLECTION OF PELVIC WASHINGS, LYSIS OF ADHESIONS;  Surgeon: Theresa Cobbs, MD;  Location: [place];  Service: Gynecology;  Laterality: N/A;  collection of pelvic washings  ? TUBAL LIGATION    ? ? ?FAMILY HISTORY: ?Family History  ?Problem Relation Age of Onset  ? Hypertension Father   ? Stomach cancer Father   ?     may have been colon, diagnosed in his 59s  ? Depression Mother   ? Dementia Mother   ? Thyroid disease Sister   ? Heart disease Sister   ? Brain cancer Maternal Aunt 80  ? Cancer Maternal Grandmother   ?     undetermined type, diagnosed in her 46s  ? Bone cancer Paternal Grandfather 12  ? Uterine cancer Paternal Aunt 32   ? Uterine cancer Cousin   ?     paternal 1st cousin, diagnosed in her late 61s  ? Uterine cancer Cousin 74  ?     paternal 1st cousin  ?Patient's father was 26 years old when he died.  He had "stomach cancer", she thinks possibly colon.  Patient's mother died from complications from a fall at age 58. The patient denies a family hx of breast or ovarian cancer. She had no brothers, 2 sisters, 1 of whom died from sepsis.  1 grandmother, a former Armed forces logistics/support/administrative officer, died from what may have been lung cancer.  She also reports uterine cancer in a cousin and possibly an aunt. ? ? ?GYNECOLOGIC HISTORY:  ?Patient's last menstrual period was 09/01/2007 (exact date). ?Menarche: 66 years old ?Age at first live birth: 66 years old (the patient has a retroverted uterus, likely explaining her difficulty in getting pregnant) ?GX P 1 ?LMP around age 89 ?Contraceptive used from 5631-4970, no complications ?HRT used for 5 years, 2013 until 01 /2637 , no complications ?Hysterectomy? Yes, 12/2019 ?BSO? yes ? ? 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  Her husband Coralyn Pear is a Librarian, academic for an Geophysicist/field seismologist. She lives at home with  husband Daryl.  They have some horses.  Son Luisa Hart married in 2022 and works also as a Librarian, academic for Copywriter, advertising.  ? ADVANCED DIRECTIVES: In the absence of any documentation to the contrary, the patient's spouse is their HCPOA. ? ? ?HEALTH MAINTENANCE: ?Social History  ? ?Tobacco Use  ? Smoking status: Former  ?  Types: Cigarettes  ?  Quit date: 03/15/1996  ?  Years since quitting: 25.7  ? Smokeless tobacco: Never  ?Vaping Use  ? Vaping Use: Never used  ?Substance Use Topics  ? Alcohol use: Yes  ?  Alcohol/week: 0.0 standard drinks  ?  Comment: occ wine  ? Drug use: No  ? ? ? Colonoscopy: 2022/[facility] ? PAP: 06/2019, negative ? Bone density: never done ?  ?Allergies  ?Allergen Reactions  ? Metronidazole Other (See Comments)  ?  C-Diff ?C-Diff ?C-Diff  ? Other  Other (See Comments) and Rash  ?  Dermabond - redness and burning, blistering ?Dermabond - redness and burning, blistering ?Dermabond - redness and burning, blistering ?"burning" skin  ? Tape Rash  ?  "bur

## 2021-12-16 ENCOUNTER — Telehealth: Payer: Self-pay | Admitting: *Deleted

## 2021-12-16 NOTE — Telephone Encounter (Signed)
Patient called. She saw Dr. Jana Hakim (now retired) in past. She states Dr. Jana Hakim told her at her last appt with him that she would see Dr. Lindi Adie for further care at [facility].  ?Patient states she thought she would see Dr. Lindi Adie and did not realize follow up appt was with Dr. Chryl Heck.  ?Patient has requested future follow up appts be scheduled w/Dr. Lindi Adie. ? ?

## 2022-02-19 ENCOUNTER — Telehealth: Payer: Self-pay | Admitting: *Deleted

## 2022-02-19 ENCOUNTER — Other Ambulatory Visit: Payer: Self-pay

## 2022-02-19 MED ORDER — VENLAFAXINE HCL ER 37.5 MG PO CP24: 37.5000 mg | ORAL_CAPSULE | Freq: Every day | ORAL | 4 refills | Status: DC

## 2022-02-19 NOTE — Telephone Encounter (Signed)
Received message from patient stating she is having trouble getting her medications refilled. Attempted to call patient back but had to leave VM for her to return my call.

## 2022-03-25 ENCOUNTER — Telehealth: Payer: Self-pay | Admitting: Hematology and Oncology

## 2022-03-25 NOTE — Telephone Encounter (Signed)
.  Called patient to schedule appointment per inbasket, patient is aware of date and time.

## 2022-06-15 ENCOUNTER — Telehealth: Payer: Self-pay | Admitting: Hematology and Oncology

## 2022-06-15 NOTE — Telephone Encounter (Signed)
Rescheduled appointment per provider [facility]. Patient is aware of the changes made to her upcoming appointment. 

## 2022-06-23 ENCOUNTER — Ambulatory Visit
Admission: RE | Admit: 2022-06-23 | Discharge: 2022-06-23 | Disposition: A | Discharge status: Home / Self Care | Payer: Medicare HMO | Source: Ambulatory Visit | Attending: Hematology and Oncology | Admitting: Hematology and Oncology

## 2022-06-23 DIAGNOSIS — C50311 Malignant neoplasm of lower-inner quadrant of right female breast: Secondary | ICD-10-CM

## 2022-06-24 ENCOUNTER — Other Ambulatory Visit: Payer: Commercial Managed Care - PPO

## 2022-06-24 ENCOUNTER — Ambulatory Visit: Payer: Commercial Managed Care - PPO | Admitting: Hematology and Oncology

## 2022-06-26 NOTE — Progress Notes (Signed)
Patient Care Team: [name], MD as PCP - General (Internal Medicine) Mauro Kaufmann, RN as Oncology Nurse Navigator Carlynn Spry, Charlott Holler, RN as Oncology Nurse Navigator Stark Klein, MD as Consulting Physician (General Surgery) Eppie Gibson, MD as Attending Physician (Radiation Oncology) Nunzio Cobbs, MD as Consulting Physician (Obstetrics and Gynecology) Rolm Bookbinder, MD as Consulting Physician (Dermatology) Ardis Hughs, MD as Consulting Physician (Urology) Juanita Craver, MD as Consulting Physician (Gastroenterology) Renette Butters, MD as Attending Physician (Orthopedic Surgery) Benay Pike, MD as Consulting Physician (Hematology and Oncology)  DIAGNOSIS: No diagnosis found.  SUMMARY OF ONCOLOGIC HISTORY: Oncology History  Malignant neoplasm of lower-inner quadrant of right breast of female, estrogen receptor positive ([place])  07/06/2019 Initial Diagnosis   Malignant neoplasm of lower-inner quadrant of right breast of female, estrogen receptor positive ([place])   08/04/2019 Genetic Testing   Negative genetic testing:  No pathogenic variants detected on the [facility] Breast Cancer STAT panel or the Common Hereditary Cancers panel. The report date is 08/04/2019.  The STAT Breast cancer panel offered by [facility] includes sequencing and rearrangement analysis for the following 9 genes:  ATM, BRCA1, BRCA2, CDH1, CHEK2, PALB2, PTEN, STK11 and TP53.  The Common Hereditary Cancers Panel offered by [facility] includes sequencing and/or deletion duplication testing of the following 48 genes: APC, ATM, AXIN2, BARD1, BMPR1A, BRCA1, BRCA2, BRIP1, CDH1, CDK4, CDKN2A (p14ARF), CDKN2A (p16INK4a), CHEK2, CTNNA1, DICER1, EPCAM (Deletion/duplication testing only), GREM1 (promoter region deletion/duplication testing only), KIT, MEN1, MLH1, MSH2, MSH3, MSH6, MUTYH, NBN, NF1, NHTL1, PALB2, PDGFRA, PMS2, POLD1, POLE, PTEN, RAD50, RAD51C, RAD51D, RNF43, SDHB, SDHC, SDHD, SMAD4, SMARCA4. STK11,  TP53, TSC1, TSC2, and VHL.  The following genes were evaluated for sequence changes only: SDHA and HOXB13 c.251G>A variant only.    10/04/2019 Cancer Staging   Staging form: Breast, AJCC 8th Edition - Pathologic stage from 10/04/2019: Stage IIB (pT2, pN1a(sn), cM0, G2, ER+, PR-, HER2-) - Signed by Gardenia Phlegm, NP on 10/18/2019     CHIEF COMPLIANT: Follow-up Estrogen receptor positive breast cancer. Establish oncology care with Dr. Lindi Adie   INTERVAL HISTORY: Theresa [name] is a 66 y.o female with the above-mentioned Estrogen receptor positive breast cancer. She presents to the clinic for a follow-up. Establish oncology care with Dr. Lindi Adie     ALLERGIES:  is allergic to metronidazole, other, and tape.  MEDICATIONS:  Current Outpatient Medications  Medication Sig Dispense Refill   Ascorbic Acid (VITAMIN C) 100 MG tablet Take 100 mg by mouth daily.     Cholecalciferol (VITAMIN D) 10 MCG/ML LIQD Take 1 drop by mouth in the morning and at bedtime.      CRANBERRY PO Take 1 tablet by mouth in the morning and at bedtime.      gabapentin (NEURONTIN) 300 MG capsule Take 1 capsule (300 mg total) by mouth at bedtime. 90 capsule 4   hydrocortisone 2.5 % cream Apply topically 3 (three) times daily as needed. 28 g 3   Lactobacillus Rhamnosus, GG, (CULTURELLE PO) Take 1 capsule by mouth daily.      loperamide (IMODIUM) 2 MG capsule Take 2 mg by mouth in the morning and at bedtime.      Magnesium 100 MG TABS Take 100 mg by mouth daily.      Melatonin 10 MG CAPS Take 10 mg by mouth at bedtime.      Pumpkin Seed-Soy Germ (AZO BLADDER CONTROL/GO-LESS PO) Take 1 tablet by mouth in the morning and at bedtime.  QUERCETIN PO Take 1 tablet by mouth in the morning and at bedtime.     tamoxifen (NOLVADEX) 20 MG tablet Take 1 tablet (20 mg total) by mouth daily. 90 tablet 4   triamcinolone (KENALOG) 0.025 % ointment Apply 1 application topically 2 (two) times daily. Use for one week at a time. 30 g 0    TURMERIC PO Take 5 mLs by mouth daily.     valsartan (DIOVAN) 40 MG tablet Take by mouth.     venlafaxine XR (EFFEXOR-XR) 37.5 MG 24 hr capsule Take 1 capsule (37.5 mg total) by mouth daily. 90 capsule 4   vitamin E 180 MG (400 UNITS) capsule Take 400 Units by mouth daily.     No current facility-administered medications for this visit.    PHYSICAL EXAMINATION: ECOG PERFORMANCE STATUS: {CHL ONC ECOG [phone]}  There were no vitals filed for this visit. There were no vitals filed for this visit.  BREAST:*** No palpable masses or nodules in either right or left breasts. No palpable axillary supraclavicular or infraclavicular adenopathy no breast tenderness or nipple discharge. (exam performed in the presence of a chaperone)  LABORATORY DATA:  I have reviewed the data as listed    Latest Ref Rng & Units 12/10/2021    1:01 PM 07/01/2021   11:02 AM 12/09/2020   11:15 AM  CMP  Glucose 70 - 99 mg/dL 104  98  100   BUN 8 - 23 mg/dL _0 Creatinine 0.44 - 1.00 mg/dL 0.75  0.73  0.71   Sodium 135 - 145 mmol/L 137  137  138   Potassium 3.5 - 5.1 mmol/L 4.4  4.7  4.6   Chloride 98 - 111 mmol/L 104  102  103   CO2 22 - 32 mmol/L _1 Calcium 8.9 - 10.3 mg/dL 9.5  9.4  9.2   Total Protein 6.5 - 8.1 g/dL 7.0  7.1  6.8   Total Bilirubin 0.3 - 1.2 mg/dL 0.3  0.3  0.3   Alkaline Phos 38 - 126 U/L 53  38  36   AST 15 - 41 U/L _2 ALT 0 - 44 U/L _3 Lab Results  Component Value Date   WBC 7.3 12/10/2021   HGB 12.0 12/10/2021   HCT 36.2 12/10/2021   MCV 92.1 12/10/2021   PLT 162 12/10/2021   NEUTROABS 3.7 12/10/2021    ASSESSMENT & PLAN:  No problem-specific Assessment & Plan notes found for this encounter.    No orders of the defined types were placed in this encounter.  The patient has a good understanding of the overall plan. she agrees with it. she will call with any problems that may develop before the next visit here. Total time  spent: 30 mins including face to face time and time spent for planning, charting and co-ordination of care   Suzzette Righter, [place] 06/26/22    I Gardiner Coins am scribing for Dr. Lindi Adie  ***

## 2022-06-29 ENCOUNTER — Other Ambulatory Visit: Payer: Self-pay | Admitting: *Deleted

## 2022-06-29 DIAGNOSIS — C50311 Malignant neoplasm of lower-inner quadrant of right female breast: Secondary | ICD-10-CM

## 2022-06-30 ENCOUNTER — Other Ambulatory Visit: Payer: Self-pay

## 2022-06-30 ENCOUNTER — Inpatient Hospital Stay: Payer: Medicare HMO | Attending: Hematology and Oncology

## 2022-06-30 ENCOUNTER — Inpatient Hospital Stay: Payer: Medicare HMO | Admitting: Hematology and Oncology

## 2022-06-30 DIAGNOSIS — Z170 Estrogen receptor positive status [ER+]: Secondary | ICD-10-CM

## 2022-06-30 DIAGNOSIS — C50311 Malignant neoplasm of lower-inner quadrant of right female breast: Secondary | ICD-10-CM

## 2022-06-30 DIAGNOSIS — Z79810 Long term (current) use of selective estrogen receptor modulators (SERMs): Secondary | ICD-10-CM | POA: Diagnosis not present

## 2022-06-30 DIAGNOSIS — Z79899 Other long term (current) drug therapy: Secondary | ICD-10-CM | POA: Insufficient documentation

## 2022-06-30 LAB — CMP (CANCER CENTER ONLY)
ALT: 16 U/L (ref 0–44)
AST: 27 U/L (ref 15–41)
Albumin: 4.2 g/dL (ref 3.5–5.0)
Alkaline Phosphatase: 55 U/L (ref 38–126)
Anion gap: 9 (ref 5–15)
BUN: 11 mg/dL (ref 8–23)
CO2: 26 mmol/L (ref 22–32)
Calcium: 9.2 mg/dL (ref 8.9–10.3)
Chloride: 104 mmol/L (ref 98–111)
Creatinine: 1.01 mg/dL — ABNORMAL HIGH (ref 0.44–1.00)
GFR, Estimated: >60 mL/min (ref >60)
Glucose, Bld: 91 mg/dL (ref 70–99)
Potassium: 4.2 mmol/L (ref 3.5–5.1)
Sodium: 139 mmol/L (ref 135–145)
Total Bilirubin: 0.3 mg/dL (ref 0.3–1.2)
Total Protein: 6.7 g/dL (ref 6.5–8.1)

## 2022-06-30 LAB — CBC WITH DIFFERENTIAL (CANCER CENTER ONLY)
Abs Immature Granulocytes: 0.02 10*3/uL (ref 0.00–0.07)
Basophils Absolute: 0 10*3/uL (ref 0.0–0.1)
Basophils Relative: 1 %
Eosinophils Absolute: 0.1 10*3/uL (ref 0.0–0.5)
Eosinophils Relative: 1 %
HCT: 33.4 % — ABNORMAL LOW (ref 36.0–46.0)
Hemoglobin: 11.4 g/dL — ABNORMAL LOW (ref 12.0–15.0)
Immature Granulocytes: 0 %
Lymphocytes Relative: 34 %
Lymphs Abs: 2.4 10*3/uL (ref 0.7–4.0)
MCH: 31.4 pg (ref 26.0–34.0)
MCHC: 34.1 g/dL (ref 30.0–36.0)
MCV: 92 fL (ref 80.0–100.0)
Monocytes Absolute: 0.5 10*3/uL (ref 0.1–1.0)
Monocytes Relative: 7 %
Neutro Abs: 4 10*3/uL (ref 1.7–7.7)
Neutrophils Relative %: 57 %
Platelet Count: 135 10*3/uL — ABNORMAL LOW (ref 150–400)
RBC: 3.63 MIL/uL — ABNORMAL LOW (ref 3.87–5.11)
RDW: 12.6 % (ref 11.5–15.5)
WBC Count: 7 10*3/uL (ref 4.0–10.5)
nRBC: 0 % (ref 0.0–0.2)

## 2022-06-30 MED ORDER — TAMOXIFEN CITRATE 20 MG PO TABS: 20.0000 mg | ORAL_TABLET | Freq: Every day | ORAL | 3 refills | Status: DC

## 2022-06-30 MED ORDER — GABAPENTIN 300 MG PO CAPS: 300.0000 mg | ORAL_CAPSULE | Freq: Every day | ORAL | 4 refills | Status: DC

## 2022-06-30 NOTE — Assessment & Plan Note (Addendum)
07/03/2019: Right breast LIQ T1CN0 stage Ia grade 2 IDC ER positive PR negative HER2 negative Ki-67 5% 10/04/2019: Right lumpectomy: T2N1 stage IIb grade 2 IDC positive lymphovascular invasion 3 lymph nodes removed 1 with macro N1 micrometastatic disease 10/04/2019: MammaPrint: Low risk 01/05/2020: Completed adjuvant radiation 08/04/2019: Genetics: Negative  Current treatment: Tamoxifen started November 2020 x 10 years  Tamoxifen toxicities: 1.    Breast cancer surveillance: 1.  Breast exam 06/30/2022: Benign 2. mammogram 06/23/2022: Benign breast density category C  Return to clinic in 1 year for follow-up

## 2022-07-08 ENCOUNTER — Ambulatory Visit (INDEPENDENT_AMBULATORY_CARE_PROVIDER_SITE_OTHER): Payer: Medicare HMO | Admitting: Obstetrics and Gynecology

## 2022-07-08 ENCOUNTER — Encounter: Payer: Self-pay | Admitting: Obstetrics and Gynecology

## 2022-07-08 VITALS — BP 132/80 | Ht 64.0 in | Wt 154.0 lb

## 2022-07-08 DIAGNOSIS — Z853 Personal history of malignant neoplasm of breast: Secondary | ICD-10-CM

## 2022-07-08 DIAGNOSIS — Z9289 Personal history of other medical treatment: Secondary | ICD-10-CM | POA: Diagnosis not present

## 2022-07-08 DIAGNOSIS — Z01419 Encounter for gynecological examination (general) (routine) without abnormal findings: Secondary | ICD-10-CM

## 2022-07-08 DIAGNOSIS — Z5181 Encounter for therapeutic drug level monitoring: Secondary | ICD-10-CM | POA: Diagnosis not present

## 2022-07-08 DIAGNOSIS — Z9189 Other specified personal risk factors, not elsewhere classified: Secondary | ICD-10-CM

## 2022-07-08 DIAGNOSIS — N763 Subacute and chronic vulvitis: Secondary | ICD-10-CM | POA: Diagnosis not present

## 2022-07-08 MED ORDER — TRIAMCINOLONE ACETONIDE 0.025 % EX OINT: 1.0000 | TOPICAL_OINTMENT | Freq: Two times a day (BID) | CUTANEOUS | 1 refills | Status: DC

## 2022-07-08 NOTE — Progress Notes (Signed)
66 y.o. G48P1001 Married Caucasian female here for annual exam.    On Tamoxifen for breast cancer.  Some hot flashes.  Seeing Dr. Lindi Adie for her breast cancer care.  She had blood work and had Hgb 11.4 and platelets 135,00.  Uses Triamcinolone prn.  Needs a refill.   PCP:   [name], MD  Patient's last menstrual period was 09/01/2007 (exact date).           Sexually active: Yes.    The current method of family planning is post menopausal status.    Exercising: Yes.    Home exercise routine includes walking 1 hrs per day. Smoker:  no  Health Maintenance: Pap:  06/23/2019 neg, hpv neg,06-23-19 Neg:Neg HR HPV, 06-09-17 Neg:Neg HR HPV, 05-02-15 Neg:Neg HR HPV  History of abnormal Pap:  no MMG:  06/23/2022 -BI-RADS2 BI-RADS CATEGORY  2: Benign.  Colonoscopy:  02/10/2021 - polyp - due in 3 years.  BMD:   07/16/2021 normal TDaP: pcp  Gardasil:   no HIV:  unsure Hep C:  unsure Screening Labs:  PCP   reports that she quit smoking about 26 years ago. Her smoking use included cigarettes. She has never used smokeless tobacco. She reports current alcohol use. She reports that she does not use drugs.  Past Medical History:  Diagnosis Date   Breast cancer, right ([place])    Contact lens/glasses fitting    wears contacts or glasses   Diarrhea    --post gallbladder surgery   Family history of bone cancer    Family history of brain cancer    Family history of stomach cancer    Family history of uterine cancer    Fibroids    History of radiation therapy    Hypertension    No pertinent past medical history    Personal history of radiation therapy    PONV (postoperative nausea and vomiting)    Right ovarian cyst    Vertigo     Past Surgical History:  Procedure Laterality Date   BLADDER SUSPENSION     BREAST BIOPSY  09/08/2012   Procedure: BREAST BIOPSY;  Surgeon: Odis Hollingshead, MD;  Location: [facility];  Service: General;  Laterality: Left;  remove left breast mass    BREAST LUMPECTOMY Right 10/2019   BREAST LUMPECTOMY WITH RADIOACTIVE SEED AND SENTINEL LYMPH NODE BIOPSY Right 10/04/2019   Procedure: RIGHT BREAST LUMPECTOMY WITH BRACKETED RADIOACTIVE SEEDS, RIGHT BREAST RADIOACTIVE SEED GUIDED EXCISION BIOPSY, AND RIGHT SENTINEL LYMPH NODE BIOPSY;  Surgeon: Stark Klein, MD;  Location: [facility];  Service: General;  Laterality: Right;   BREAST SURGERY     lumpectomy x2   BUNIONECTOMY Right 01/2016   CHOLECYSTECTOMY     COLONOSCOPY     CYSTOSCOPY N/A 01/16/2020   Procedure: CYSTOSCOPY;  Surgeon: Nunzio Cobbs, MD;  Location: [facility];  Service: Gynecology;  Laterality: N/A;   MASTOPEXY Right 10/04/2019   Procedure: RIGHT BREAST MASTOPEXY;  Surgeon: Stark Klein, MD;  Location: [facility];  Service: General;  Laterality: Right;   RE-EXCISION OF BREAST LUMPECTOMY Right 10/31/2019   Procedure: RIGHT RE-EXCISION OF BREAST LUMPECTOMY;  Surgeon: Stark Klein, MD;  Location: [facility];  Service: General;  Laterality: Right;   TOTAL LAPAROSCOPIC HYSTERECTOMY WITH BILATERAL SALPINGO OOPHORECTOMY N/A 01/16/2020   Procedure: TOTAL LAPAROSCOPIC HYSTERECTOMY WITH BILATERAL SALPINGO OOPHORECTOMY/COLLECTION OF PELVIC WASHINGS, LYSIS OF ADHESIONS;  Surgeon: Nunzio Cobbs, MD;  Location: [place];  Service: Gynecology;  Laterality: N/A;  collection of pelvic washings   TUBAL LIGATION      Current Outpatient Medications  Medication Sig Dispense Refill   Ascorbic Acid (VITAMIN C) 100 MG tablet Take 100 mg by mouth daily.     Cholecalciferol (VITAMIN D) 10 MCG/ML LIQD Take 1 drop by mouth in the morning and at bedtime.      CRANBERRY PO Take 1 tablet by mouth in the morning and at bedtime.      gabapentin (NEURONTIN) 300 MG capsule Take 1 capsule (300 mg total) by mouth at bedtime. 90 capsule 4   hydrocortisone 2.5 % cream Apply topically 3 (three) times daily as needed.  28 g 3   Lactobacillus Rhamnosus, GG, (CULTURELLE PO) Take 1 capsule by mouth daily.      loperamide (IMODIUM) 2 MG capsule Take 2 mg by mouth in the morning and at bedtime.      Magnesium 100 MG TABS Take 100 mg by mouth daily.      Melatonin 10 MG CAPS Take 10 mg by mouth at bedtime.      Pumpkin Seed-Soy Germ (AZO BLADDER CONTROL/GO-LESS PO) Take 1 tablet by mouth in the morning and at bedtime.      QUERCETIN PO Take 1 tablet by mouth in the morning and at bedtime.     tamoxifen (NOLVADEX) 20 MG tablet Take 1 tablet (20 mg total) by mouth daily. 90 tablet 3   triamcinolone (KENALOG) 0.025 % ointment Apply 1 application topically 2 (two) times daily. Use for one week at a time. 30 g 0   TURMERIC PO Take 5 mLs by mouth daily.     valsartan (DIOVAN) 40 MG tablet Take by mouth.     venlafaxine XR (EFFEXOR-XR) 37.5 MG 24 hr capsule Take 1 capsule (37.5 mg total) by mouth daily. 90 capsule 4   vitamin E 180 MG (400 UNITS) capsule Take 400 Units by mouth daily.     No current facility-administered medications for this visit.    Family History  Problem Relation Age of Onset   Hypertension Father    Stomach cancer Father        may have been colon, diagnosed in his 74s   Depression Mother    Dementia Mother    Thyroid disease Sister    Heart disease Sister    Brain cancer Maternal Aunt 72   Cancer Maternal Grandmother        undetermined type, diagnosed in her 34s   Bone cancer Paternal Grandfather 40   Uterine cancer Paternal Aunt 8   Uterine cancer Cousin        paternal 1st cousin, diagnosed in her late 71s   Uterine cancer Cousin 60       paternal 1st cousin    Review of Systems  All other systems reviewed and are negative.   Exam:   BP 132/80 (BP Location: Right Arm, Patient Position: Sitting, Cuff Size: Normal)   Ht '5\' 4"'$  (1.626 m)   Wt 154 lb (69.9 kg)   LMP 09/01/2007 (Exact Date)   BMI 26.43 kg/m     General appearance: alert, cooperative and appears stated  age Head: normocephalic, without obvious abnormality, atraumatic Neck: no adenopathy, supple, symmetrical, trachea midline and thyroid normal to inspection and palpation Lungs: clear to auscultation bilaterally Breasts: right breast - scar and nipple change, no masses or tenderness, No nipple retraction or dimpling, No nipple discharge or bleeding, No axillary  adenopathy Left breast - normal appearance, no masses or tenderness, No nipple retraction or dimpling, No nipple discharge or bleeding, No axillary adenopathy Heart: regular rate and rhythm Abdomen: soft, non-tender; no masses, no organomegaly Extremities: extremities normal, atraumatic, no cyanosis or edema Skin: skin color, texture, turgor normal. No rashes or lesions Lymph nodes: cervical, supraclavicular, and axillary nodes normal. Neurologic: grossly normal  Pelvic: External genitalia:  no lesions              No abnormal inguinal nodes palpated.              Urethra:  normal appearing urethra with no masses, tenderness or lesions              Bartholins and Skenes: normal                 Vagina: normal appearing vagina with normal color and discharge, no lesions              Cervix: absent              Pap taken: no Bimanual Exam:  Uterus:  absent              Adnexa: no mass, fullness, tenderness              Rectal exam: yes.  Confirms.              Anus:  normal sphincter tone, no lesions  Chaperone was present for exam:  Kimalexis, RNA  Assessment:   Well woman visit with gynecologic exam. Status post total laparoscopic hysterectomy with BSO.     Personal hx right breast cancer.  On Tamoxifen. Hx vulvitis.  Encounter for medication monitoring.  Anemia and thrombocytopenia.    Plan: Mammogram screening discussed. Pap and HR HPV not indicated. Guidelines for Calcium, Vitamin D, regular exercise program including cardiovascular and weight bearing exercise. Benefits of Tamoxifen for improving bone density  discussed. Rx for Triamcinolone ointment to use prn.  She will follow up with her oncologist regarding her lab work.  Follow up annually and prn.   After visit summary provided.   25 min  total time was spent for this patient encounter, including preparation, face-to-face counseling with the patient, coordination of care, and documentation of the encounter.

## 2022-07-08 NOTE — Patient Instructions (Signed)

## 2022-09-28 ENCOUNTER — Encounter: Payer: Self-pay | Admitting: Adult Health

## 2022-09-28 ENCOUNTER — Inpatient Hospital Stay: Payer: Medicare HMO

## 2022-09-28 ENCOUNTER — Other Ambulatory Visit: Payer: Self-pay

## 2022-09-28 ENCOUNTER — Inpatient Hospital Stay: Payer: Medicare HMO | Attending: Adult Health | Admitting: Adult Health

## 2022-09-28 ENCOUNTER — Telehealth: Payer: Self-pay

## 2022-09-28 VITALS — BP 128/63 | HR 95 | Temp 98.8°F | Resp 16 | Ht 64.0 in | Wt 152.9 lb

## 2022-09-28 DIAGNOSIS — R197 Diarrhea, unspecified: Secondary | ICD-10-CM | POA: Insufficient documentation

## 2022-09-28 DIAGNOSIS — Z8049 Family history of malignant neoplasm of other genital organs: Secondary | ICD-10-CM | POA: Insufficient documentation

## 2022-09-28 DIAGNOSIS — C50311 Malignant neoplasm of lower-inner quadrant of right female breast: Secondary | ICD-10-CM | POA: Diagnosis not present

## 2022-09-28 DIAGNOSIS — Z87891 Personal history of nicotine dependence: Secondary | ICD-10-CM | POA: Insufficient documentation

## 2022-09-28 DIAGNOSIS — Z923 Personal history of irradiation: Secondary | ICD-10-CM | POA: Diagnosis not present

## 2022-09-28 DIAGNOSIS — Z800 Family history of malignant neoplasm of digestive organs: Secondary | ICD-10-CM | POA: Insufficient documentation

## 2022-09-28 DIAGNOSIS — R233 Spontaneous ecchymoses: Secondary | ICD-10-CM | POA: Diagnosis not present

## 2022-09-28 DIAGNOSIS — Z780 Asymptomatic menopausal state: Secondary | ICD-10-CM | POA: Diagnosis not present

## 2022-09-28 DIAGNOSIS — Z90710 Acquired absence of both cervix and uterus: Secondary | ICD-10-CM | POA: Insufficient documentation

## 2022-09-28 DIAGNOSIS — Z808 Family history of malignant neoplasm of other organs or systems: Secondary | ICD-10-CM | POA: Insufficient documentation

## 2022-09-28 DIAGNOSIS — R791 Abnormal coagulation profile: Secondary | ICD-10-CM

## 2022-09-28 DIAGNOSIS — K5730 Diverticulosis of large intestine without perforation or abscess without bleeding: Secondary | ICD-10-CM | POA: Insufficient documentation

## 2022-09-28 DIAGNOSIS — Z90722 Acquired absence of ovaries, bilateral: Secondary | ICD-10-CM | POA: Diagnosis not present

## 2022-09-28 DIAGNOSIS — R7989 Other specified abnormal findings of blood chemistry: Secondary | ICD-10-CM

## 2022-09-28 DIAGNOSIS — Z79810 Long term (current) use of selective estrogen receptor modulators (SERMs): Secondary | ICD-10-CM | POA: Insufficient documentation

## 2022-09-28 DIAGNOSIS — Z170 Estrogen receptor positive status [ER+]: Secondary | ICD-10-CM

## 2022-09-28 DIAGNOSIS — A048 Other specified bacterial intestinal infections: Secondary | ICD-10-CM | POA: Insufficient documentation

## 2022-09-28 LAB — CMP (CANCER CENTER ONLY)
ALT: 45 U/L — ABNORMAL HIGH (ref 0–44)
AST: 81 U/L — ABNORMAL HIGH (ref 15–41)
Albumin: 4.6 g/dL (ref 3.5–5.0)
Alkaline Phosphatase: 74 U/L (ref 38–126)
Anion gap: 10 (ref 5–15)
BUN: 16 mg/dL (ref 8–23)
CO2: 25 mmol/L (ref 22–32)
Calcium: 11 mg/dL — ABNORMAL HIGH (ref 8.9–10.3)
Chloride: 101 mmol/L (ref 98–111)
Creatinine: 0.88 mg/dL (ref 0.44–1.00)
GFR, Estimated: >60 mL/min (ref >60)
Glucose, Bld: 91 mg/dL (ref 70–99)
Potassium: 4.6 mmol/L (ref 3.5–5.1)
Sodium: 136 mmol/L (ref 135–145)
Total Bilirubin: 0.4 mg/dL (ref 0.3–1.2)
Total Protein: 7.6 g/dL (ref 6.5–8.1)

## 2022-09-28 LAB — CBC WITH DIFFERENTIAL (CANCER CENTER ONLY)
Abs Immature Granulocytes: 0.05 10*3/uL (ref 0.00–0.07)
Basophils Absolute: 0.1 10*3/uL (ref 0.0–0.1)
Basophils Relative: 1 %
Eosinophils Absolute: 0.1 10*3/uL (ref 0.0–0.5)
Eosinophils Relative: 1 %
HCT: 37 % (ref 36.0–46.0)
Hemoglobin: 12.4 g/dL (ref 12.0–15.0)
Immature Granulocytes: 1 %
Lymphocytes Relative: 33 %
Lymphs Abs: 2.6 10*3/uL (ref 0.7–4.0)
MCH: 30.2 pg (ref 26.0–34.0)
MCHC: 33.5 g/dL (ref 30.0–36.0)
MCV: 90.2 fL (ref 80.0–100.0)
Monocytes Absolute: 0.6 10*3/uL (ref 0.1–1.0)
Monocytes Relative: 8 %
Neutro Abs: 4.5 10*3/uL (ref 1.7–7.7)
Neutrophils Relative %: 56 %
Platelet Count: 65 10*3/uL — ABNORMAL LOW (ref 150–400)
RBC: 4.1 MIL/uL (ref 3.87–5.11)
RDW: 13.1 % (ref 11.5–15.5)
WBC Count: 7.9 10*3/uL (ref 4.0–10.5)
nRBC: 0.3 % — ABNORMAL HIGH (ref 0.0–0.2)

## 2022-09-28 LAB — APTT: aPTT: 37 seconds — ABNORMAL HIGH (ref 24–36)

## 2022-09-28 LAB — PROTIME-INR
INR: 1.9 — ABNORMAL HIGH (ref 0.8–1.2)
Prothrombin Time: 21.2 seconds — ABNORMAL HIGH (ref 11.4–15.2)

## 2022-09-28 LAB — VITAMIN D 25 HYDROXY (VIT D DEFICIENCY, FRACTURES): Vit D, 25-Hydroxy: 96.83 ng/mL (ref 30–100)

## 2022-09-28 MED ORDER — PHYTONADIONE 5 MG PO TABS: 5.0000 mg | ORAL_TABLET | Freq: Every day | ORAL | 0 refills | Status: DC

## 2022-09-28 NOTE — Telephone Encounter (Signed)
Pt called and states she has had unexplained generalized bruising for the past 3 weeks. 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 She states the bruises are not painful and there is no erythema around bruising. She states she is concerned about her most recent labs showing she is slightly anemic and is requesting labs, along with a Vit D level. Lab appt scheduled today at 1445 and NP visit for 1515 to further evaluate bruising.

## 2022-09-28 NOTE — Progress Notes (Signed)
Relayed CT appt for 1/30 with Pt in office. Pt to be NPO after 1000, arrive to [facility] at 1200, and scan is scheduled for 1400. Pt verbalized understanding.

## 2022-09-28 NOTE — Progress Notes (Addendum)
[facility] Follow up:    Theresa Noble, MD [address] [place] [address]   DIAGNOSIS:  Cancer Staging  Malignant neoplasm of lower-inner quadrant of right breast of female, estrogen receptor positive ([place]) Staging form: Breast, AJCC 8th Edition - Clinical stage from 07/26/2019: Stage IA (cT1c, cN0, cM0, G2, ER+, PR-, HER2-) - Unsigned Stage prefix: Initial diagnosis Histologic grading system: 3 grade system Laterality: Right Staged by: Pathologist and managing physician Stage used in treatment planning: Yes National guidelines used in treatment planning: Yes Type of national guideline used in treatment planning: NCCN - Pathologic stage from 10/04/2019: Stage IIB (pT2, pN1a(sn), cM0, G2, ER+, PR-, HER2-) - Signed by Gardenia Phlegm, NP on 10/18/2019 Stage prefix: Initial diagnosis Method of lymph node assessment: Sentinel lymph node biopsy Histologic grading system: 3 grade system   SUMMARY OF ONCOLOGIC HISTORY: [place] woman status post right lower inner quadrant biopsy 07/03/2019 for a clinicaly multiple T1c N0, stage IA invasive ductal carcinoma, grade 2, E-cadherin positive, strongly estrogen receptor positive but progesterone receptor negative and HER-2 not amplified, with an MIB-1 of 5%             (a) breast MRI 08/07/2019 showed 2 additional suspicious areas in the right breast and 1 in the left breast              (b) biopsy of all 3 areas 08/21/2019 showed no malignancy   (1) tamoxifen started neoadjuvantly 07/26/2019   (2) right lumpectomy and sentinel lymph node sampling 10/04/2019 showed a pT2 pN1, stage IIB invasive ductal carcinoma, grade 2, with positive lymphovascular invasion and a positive superior margin             (a) 3 sentinel lymph nodes removed, one with macro metastatic deposit, 1 with a micrometastatic deposit             (b) additional surgery for margin clearance    (3) MammaPrint on the 10/04/2019 sample  shows a low risk luminal A tumor predicting a 5-year metastasis free survival of 96% without chemotherapy, and a chemotherapy benefit of less than 1.5%   (4) adjuvant radiation 11/27/2019 through 01/05/2020 Site Technique Total Dose (Gy) Dose per Fx (Gy) Completed Fx Beam Energies  Breast, Right: Breast_Rt 3D 50/50 2 25/25 6X, 10X  Breast, Right: Breast_Rt_SCV_PAB 3D 50/50 2 25/25 6X, 10X  Breast, Right: Breast_Rt_Bst 3D 10/10 2 5/5 6X, 10X    (5) genetics testing 08/04/2019 through the STAT Breast cancer panel offered by [facility] found no deleterious mutations in ATM, BRCA1, BRCA2, CDH1, CHEK2, PALB2, PTEN, STK11 and TP53.  The Common Hereditary Cancers Panel offered by [facility] includes sequencing and/or deletion duplication testing of the following 48 genes: APC, ATM, AXIN2, BARD1, BMPR1A, BRCA1, BRCA2, BRIP1, CDH1, CDK4, CDKN2A (p14ARF), CDKN2A (p16INK4a), CHEK2, CTNNA1, DICER1, EPCAM (Deletion/duplication testing only), GREM1 (promoter region deletion/duplication testing only), KIT, MEN1, MLH1, MSH2, MSH3, MSH6, MUTYH, NBN, NF1, NHTL1, PALB2, PDGFRA, PMS2, POLD1, POLE, PTEN, RAD50, RAD51C, RAD51D, RNF43, SDHB, SDHC, SDHD, SMAD4, SMARCA4. STK11, TP53, TSC1, TSC2, and VHL.  The following genes were evaluated for sequence changes only: SDHA and HOXB13 c.251G>A variant only.    (6) tamoxifen started November 2020: to be continued for 10 years             (a) status post hysterectomy and bilateral salpingo-oophorectomy 01/16/2020 with benign pathology  CURRENT THERAPY: Tamoxifen  INTERVAL HISTORY: Theresa [name] 67 y.o. female returns for follow-up and evaluation due to easy  bruising and bleeding that has been going on for the past few weeks.  She noted that after Christmas she felt poorly with a virus that began on December 26 and improved by December 29.  She notes about 3 weeks ago she got out of the shower and her husband and looked at her funny and said worded all those bruises come from and that is  when she noticed that she was bruise more so than she normally is.  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  She enjoys horseback riding.  She drinks little to no alcohol and takes Advil occasionally denies Tylenol use.  She denies any fatigue, lymphadenopathy, night sweats, or unintentional weight loss.  She has noticed some mild nausea however her husband has been nauseated as well and they had attributed it to ribs that they ate a couple days prior.  Her most recent mammogram occurred on June 23, 2022 and demonstrated no mammographic evidence of malignancy in either breast and breast density category C.   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  MEDICAL HISTORY: Past Medical History:  Diagnosis Date   Breast cancer, right ([place])    Contact lens/glasses fitting    wears contacts or glasses   Diarrhea    --post gallbladder surgery   Family history of bone cancer    Family history of brain cancer    Family history of stomach cancer    Family history of uterine cancer    Fibroids    History of radiation therapy    Hypertension    No pertinent past medical history    Personal history of radiation therapy    PONV (postoperative nausea and vomiting)    Right ovarian cyst    Vertigo     SURGICAL HISTORY: Past Surgical History:  Procedure Laterality Date   BLADDER SUSPENSION      BREAST BIOPSY  09/08/2012   Procedure: BREAST BIOPSY;  Surgeon: Odis Hollingshead, MD;  Location: [facility];  Service: General;  Laterality: Left;  remove left breast mass   BREAST LUMPECTOMY Right 10/2019   BREAST LUMPECTOMY WITH RADIOACTIVE SEED AND SENTINEL LYMPH NODE BIOPSY Right 10/04/2019   Procedure: RIGHT BREAST LUMPECTOMY WITH BRACKETED RADIOACTIVE SEEDS, RIGHT BREAST RADIOACTIVE SEED GUIDED EXCISION BIOPSY, AND RIGHT SENTINEL LYMPH NODE BIOPSY;  Surgeon: Stark Klein, MD;  Location: [facility];  Service: General;  Laterality: Right;   BREAST SURGERY     lumpectomy x2   BUNIONECTOMY Right 01/2016   CHOLECYSTECTOMY     COLONOSCOPY     CYSTOSCOPY N/A 01/16/2020   Procedure: CYSTOSCOPY;  Surgeon: Nunzio Cobbs, MD;  Location: [facility];  Service: Gynecology;  Laterality: N/A;   MASTOPEXY Right 10/04/2019   Procedure: RIGHT BREAST MASTOPEXY;  Surgeon: Stark Klein, MD;  Location: [facility];  Service: General;  Laterality: Right;   RE-EXCISION OF BREAST LUMPECTOMY Right 10/31/2019   Procedure: RIGHT RE-EXCISION OF BREAST LUMPECTOMY;  Surgeon: Stark Klein, MD;  Location: [facility];  Service: General;  Laterality: Right;   TOTAL LAPAROSCOPIC HYSTERECTOMY WITH BILATERAL SALPINGO OOPHORECTOMY N/A 01/16/2020   Procedure: TOTAL LAPAROSCOPIC HYSTERECTOMY WITH BILATERAL SALPINGO OOPHORECTOMY/COLLECTION OF PELVIC WASHINGS, LYSIS OF ADHESIONS;  Surgeon: Nunzio Cobbs, MD;  Location: [facility];  Service: Gynecology;  Laterality: N/A;  collection of pelvic washings   TUBAL LIGATION      SOCIAL HISTORY: Social History   Socioeconomic History   Marital status: Married    Spouse name: Daryl    Number of children: Not on file   Years of education: Not on file   Highest education level: Not on file  Occupational History   Not on file  Tobacco Use   Smoking status: Former    Types:  Cigarettes    Quit date: 03/15/1996    Years since quitting: 26.5   Smokeless tobacco: Never  Vaping Use   Vaping Use: Never used  Substance and Sexual Activity   Alcohol use: Yes    Alcohol/week: 0.0 standard drinks of alcohol    Comment: occ wine   Drug use: No   Sexual activity: Yes    Partners: Male    Birth control/protection: Surgical    Comment: BTL/hyst  Other Topics Concern   Not on file  Social History Narrative   Not on file   Social Determinants of Health   Financial Resource Strain: Not on file  Food Insecurity: Not on file  Transportation Needs: Not on file  Physical Activity: Not on file  Stress: Not on file  Social Connections: Not on file  Intimate Partner Violence: Not on file    FAMILY HISTORY: Family History  Problem Relation Age of Onset   Hypertension Father    Stomach cancer Father        may have been colon, diagnosed in his 58s   Depression Mother    Dementia Mother    Thyroid disease Sister    Heart disease Sister    Brain cancer Maternal Aunt 55   Cancer Maternal Grandmother        undetermined type, diagnosed in her 78s   Bone cancer Paternal Grandfather 30   Uterine cancer Paternal Aunt 34   Uterine cancer Cousin        paternal 1st cousin, diagnosed in her late 32s   Uterine cancer Cousin 72       paternal 1st cousin    Review of Systems  Constitutional:  Negative for appetite change, chills, fatigue, fever and unexpected weight change.  HENT:   Negative for hearing loss, lump/mass and trouble swallowing.   Eyes:  Negative for eye problems and icterus.  Respiratory:  Negative for chest tightness, cough and shortness of breath.   Cardiovascular:  Negative for chest pain, leg swelling and palpitations.  Gastrointestinal:  Negative for abdominal distention, abdominal pain, constipation, diarrhea, nausea and vomiting.  Endocrine: Negative for hot flashes.  Genitourinary:  Negative for difficulty urinating.   Musculoskeletal:   Negative for arthralgias.  Skin:  Negative for itching and rash.  Neurological:  Negative for dizziness, extremity weakness, headaches and numbness.  Hematological:  Negative for adenopathy. Bruises/bleeds easily.  Psychiatric/Behavioral:  Negative for depression. The patient is not nervous/anxious.       PHYSICAL EXAMINATION  ECOG PERFORMANCE STATUS: 1 - Symptomatic but completely ambulatory  Vitals:   09/28/22 1508  BP: 128/63  Pulse: 95  Resp: 16  Temp: 98.8 F (37.1 C)  SpO2: 98%    Physical Exam Constitutional:      General: She is not in acute distress.    Appearance: Normal appearance. She is not toxic-appearing.  HENT:     Head: Normocephalic and atraumatic.  Eyes:     General: No scleral icterus. Cardiovascular:     Rate and Rhythm: Normal rate and regular rhythm.     Pulses: Normal pulses.     Heart sounds: Normal heart sounds.  Pulmonary:     Effort: Pulmonary effort is normal.     Breath sounds: Normal breath sounds.  Abdominal:     General: Abdomen is flat. Bowel sounds are normal. There is no distension.     Palpations: Abdomen is soft.     Tenderness: There is no abdominal tenderness.  Musculoskeletal:        General: No swelling.     Cervical back: Neck supple.  Lymphadenopathy:     Cervical: No cervical adenopathy.  Skin:    General: Skin is warm and dry.     Findings: No rash.     Comments: Ecchymosis noted on bilateral legs--both upper and lower, on abdomen, and 1 on left buttock  Neurological:     General: No focal deficit present.     Mental Status: She is alert.  Psychiatric:        Mood and Affect: Mood normal.        Behavior: Behavior normal.     LABORATORY DATA:  CBC    Component Value Date/Time   WBC 7.9 09/28/2022 1453   WBC 6.1 01/17/2020 0506   RBC 4.10 09/28/2022 1453   HGB 12.4 09/28/2022 1453   HGB 10.8 (L) 01/19/2020 0911   HGB 12.8 03/15/2013 1434   HCT 37.0 09/28/2022 1453   HCT 32.8 (L) 01/19/2020 0911   PLT  65 (L) 09/28/2022 1453   PLT 216 01/19/2020 0911   MCV 90.2 09/28/2022 1453   MCV 94 01/19/2020 0911   MCH 30.2 09/28/2022 1453   MCHC 33.5 09/28/2022 1453   RDW 13.1 09/28/2022 1453   RDW 12.5 01/19/2020 0911   LYMPHSABS 2.6 09/28/2022 1453   LYMPHSABS 0.8 01/19/2020 0911   MONOABS 0.6 09/28/2022 1453   EOSABS 0.1 09/28/2022 1453   EOSABS 0.2 01/19/2020 0911   BASOSABS 0.1 09/28/2022 1453   BASOSABS 0.0 01/19/2020 0911    CMP     Component Value Date/Time   NA 136 09/28/2022 1453   K 4.6 09/28/2022 1453   CL 101 09/28/2022 1453   CO2 25 09/28/2022 1453   GLUCOSE 91 09/28/2022 1453   BUN 16 09/28/2022 1453   CREATININE 0.88 09/28/2022 1453   CALCIUM 11.0 (H) 09/28/2022 1453   PROT 7.6 09/28/2022 1453   ALBUMIN 4.6 09/28/2022 1453   AST 81 (H) 09/28/2022 1453   ALT 45 (H) 09/28/2022 1453   ALKPHOS 74 09/28/2022 1453   BILITOT 0.4 09/28/2022 1453   GFRNONAA >60 09/28/2022 1453   GFRAA >60 03/08/2020 1001       ASSESSMENT and THERAPY PLAN:   Malignant neoplasm of lower-inner quadrant of right breast of female, estrogen receptor positive ([place]) Theresa [name] is a 67 year old woman with history of stage IIb ER positive breast cancer that was diagnosed in February 2021.  She is status postlumpectomy followed by adjuvant radiation and antiestrogen therapy with tamoxifen.  Theresa [name] has a platelet count  of 65 and elevated INR of 1.9, and mildly elevated LFTs along with slight hypercalcemia.  I reviewed her lab results with Dr. Lindi Adie who joined the visit with me and we discussed with her that she will need CT chest abdomen pelvis to further evaluate the reason for the liver enzyme increase.  Once we obtain that we can proceed with additional lab testing if needed.  She will also start taking vitamin K which I sent into her pharmacy at 5 mg a day and we will recheck labs and review her scan results on Thursday this week.  Theresa [name] was instructed to stop taking her tamoxifen and all of her  vitamin supplements.  We will see her back on Thursday to repeat CBC c-Met and coags.   All questions were answered. The patient knows to call the clinic with any problems, questions or concerns. We can certainly see the patient much sooner if necessary.  Total encounter time:40 minutes*in face-to-face visit time, chart review, lab review, care coordination, order entry, and documentation of the encounter time.  Wilber Bihari, NP 09/29/22 1:28 PM Medical Oncology and Hematology [facility] [place], [place] [address] Tel. [phone]    Fax. [phone]  *Total Encounter Time as defined by the Centers for Medicare and Medicaid Services includes, in addition to the face-to-face time of a patient visit (documented in the note above) non-face-to-face time: obtaining and reviewing outside history, ordering and reviewing medications, tests or procedures, care coordination (communications with other health care professionals or caregivers) and documentation in the medical record.   Attending Note  I personally saw and examined Theresa [name]. The plan of care was discussed with her. I agree with the physical exam findings and assessment and plan as documented above. I performed the majority of the counseling and assessment and plan regarding this encounter Easy bruising and elevated LFTs: Will obtain CT chest abdomen pelvis with contrast as well as start the patient on oral vitamin K supplement.  Recommended that we stop all medications and supplements until we figure out what is going on. Breast cancer history: Currently on tamoxifen Return to clinic Thursday to discuss the results of the scans and repeat lab work. Signed Harriette Ohara, MD

## 2022-09-29 ENCOUNTER — Ambulatory Visit (HOSPITAL_COMMUNITY)
Admission: RE | Admit: 2022-09-29 | Discharge: 2022-09-29 | Disposition: A | Discharge status: Home / Self Care | Payer: Medicare HMO | Source: Ambulatory Visit | Attending: Adult Health | Admitting: Adult Health

## 2022-09-29 ENCOUNTER — Telehealth: Payer: Self-pay

## 2022-09-29 ENCOUNTER — Other Ambulatory Visit: Payer: Self-pay

## 2022-09-29 ENCOUNTER — Encounter: Payer: Self-pay | Admitting: Adult Health

## 2022-09-29 DIAGNOSIS — R7989 Other specified abnormal findings of blood chemistry: Secondary | ICD-10-CM

## 2022-09-29 DIAGNOSIS — C50311 Malignant neoplasm of lower-inner quadrant of right female breast: Secondary | ICD-10-CM | POA: Diagnosis present

## 2022-09-29 DIAGNOSIS — R233 Spontaneous ecchymoses: Secondary | ICD-10-CM | POA: Diagnosis not present

## 2022-09-29 DIAGNOSIS — Z170 Estrogen receptor positive status [ER+]: Secondary | ICD-10-CM | POA: Diagnosis present

## 2022-09-29 MED ORDER — SODIUM CHLORIDE (PF) 0.9 % IJ SOLN
INTRAMUSCULAR | Status: AC
  Filled 2022-09-29: qty 50

## 2022-09-29 MED ORDER — IOHEXOL 300 MG/ML  SOLN
100.0000 mL | Freq: Once | INTRAMUSCULAR | Status: AC | PRN
  Administered 2022-09-29: 100 mL via INTRAVENOUS

## 2022-09-29 NOTE — Assessment & Plan Note (Signed)
Theresa [name] is a 67 year old woman with history of stage IIb ER positive breast cancer that was diagnosed in February 2021.  She is status postlumpectomy followed by adjuvant radiation and antiestrogen therapy with tamoxifen.  Theresa [name] has a platelet count of 65 and elevated INR of 1.9, and mildly elevated LFTs along with slight hypercalcemia.  I reviewed her lab results with Dr. Lindi Adie who joined the visit with me and we discussed with her that she will need CT chest abdomen pelvis to further evaluate the reason for the liver enzyme increase.  Once we obtain that we can proceed with additional lab testing if needed.  She will also start taking vitamin K which I sent into her pharmacy at 5 mg a day and we will recheck labs and review her scan results on Thursday this week.  Theresa [name] was instructed to stop taking her tamoxifen and all of her vitamin supplements.  We will see her back on Thursday to repeat CBC c-Met and coags.

## 2022-09-29 NOTE — Telephone Encounter (Signed)
Called home number with husband answering. Asked husband to relay lab appt on 2/1 at 0815 to Pt. Husband verbalized understanding and said he would relay message.

## 2022-09-30 ENCOUNTER — Other Ambulatory Visit: Payer: Self-pay | Admitting: Adult Health

## 2022-09-30 DIAGNOSIS — C50311 Malignant neoplasm of lower-inner quadrant of right female breast: Secondary | ICD-10-CM

## 2022-09-30 NOTE — Assessment & Plan Note (Signed)
07/03/2019: Right breast LIQ T1CN0 stage Ia grade 2 IDC ER positive PR negative HER2 negative Ki-67 5% 10/04/2019: Right lumpectomy: T2N1 stage IIb grade 2 IDC positive lymphovascular invasion 3 lymph nodes removed 1 with macro N1 micrometastatic disease 10/04/2019: MammaPrint: Low risk 01/05/2020: Completed adjuvant radiation 08/04/2019: Genetics: Negative November 2020: Tamoxifen ------------------------------------------------------------------- 09/28/2022: Patient presented with platelets 65 INR 1.9 elevated LFTs and hypercalcemia  CT CAP 09/29/2022: Mediastinal and hilar lymphadenopathy suggestive of metastatic disease.  Patchy tree-in-bud nodularity in the lungs, partially necrotic mass right hepatic lobe 12 cm, also 2.5 cm lesion lytic bone metastases in spine pelvis and left scapula   Recommendation: Liver biopsy to determine breast prognostic panel and for Caris molecular testing Follow-up after the biopsy to discuss treatment plan.

## 2022-10-01 ENCOUNTER — Inpatient Hospital Stay (HOSPITAL_BASED_OUTPATIENT_CLINIC_OR_DEPARTMENT_OTHER): Payer: Medicare HMO | Admitting: Hematology and Oncology

## 2022-10-01 ENCOUNTER — Other Ambulatory Visit: Payer: Self-pay | Admitting: Hematology and Oncology

## 2022-10-01 ENCOUNTER — Other Ambulatory Visit: Payer: Self-pay

## 2022-10-01 ENCOUNTER — Inpatient Hospital Stay: Payer: Medicare HMO | Attending: Hematology and Oncology | Admitting: Hematology and Oncology

## 2022-10-01 ENCOUNTER — Telehealth: Payer: Self-pay

## 2022-10-01 VITALS — BP 164/89 | HR 95 | Temp 97.7°F | Resp 17 | Wt 152.4 lb

## 2022-10-01 DIAGNOSIS — Z923 Personal history of irradiation: Secondary | ICD-10-CM | POA: Diagnosis not present

## 2022-10-01 DIAGNOSIS — C50311 Malignant neoplasm of lower-inner quadrant of right female breast: Secondary | ICD-10-CM | POA: Diagnosis not present

## 2022-10-01 DIAGNOSIS — D61818 Other pancytopenia: Secondary | ICD-10-CM | POA: Insufficient documentation

## 2022-10-01 DIAGNOSIS — Z170 Estrogen receptor positive status [ER+]: Secondary | ICD-10-CM | POA: Insufficient documentation

## 2022-10-01 DIAGNOSIS — C7951 Secondary malignant neoplasm of bone: Secondary | ICD-10-CM | POA: Insufficient documentation

## 2022-10-01 DIAGNOSIS — Z79811 Long term (current) use of aromatase inhibitors: Secondary | ICD-10-CM | POA: Insufficient documentation

## 2022-10-01 DIAGNOSIS — R233 Spontaneous ecchymoses: Secondary | ICD-10-CM

## 2022-10-01 DIAGNOSIS — Z79899 Other long term (current) drug therapy: Secondary | ICD-10-CM | POA: Diagnosis not present

## 2022-10-01 DIAGNOSIS — Z87891 Personal history of nicotine dependence: Secondary | ICD-10-CM | POA: Insufficient documentation

## 2022-10-01 DIAGNOSIS — C787 Secondary malignant neoplasm of liver and intrahepatic bile duct: Secondary | ICD-10-CM | POA: Insufficient documentation

## 2022-10-01 LAB — CBC WITH DIFFERENTIAL (CANCER CENTER ONLY)
Abs Immature Granulocytes: 0.11 10*3/uL — ABNORMAL HIGH (ref 0.00–0.07)
Basophils Absolute: 0.1 10*3/uL (ref 0.0–0.1)
Basophils Relative: 1 %
Eosinophils Absolute: 0.1 10*3/uL (ref 0.0–0.5)
Eosinophils Relative: 1 %
HCT: 36.6 % (ref 36.0–46.0)
Hemoglobin: 12.2 g/dL (ref 12.0–15.0)
Immature Granulocytes: 1 %
Lymphocytes Relative: 31 %
Lymphs Abs: 2.3 10*3/uL (ref 0.7–4.0)
MCH: 30.3 pg (ref 26.0–34.0)
MCHC: 33.3 g/dL (ref 30.0–36.0)
MCV: 91 fL (ref 80.0–100.0)
Monocytes Absolute: 0.6 10*3/uL (ref 0.1–1.0)
Monocytes Relative: 7 %
Neutro Abs: 4.4 10*3/uL (ref 1.7–7.7)
Neutrophils Relative %: 59 %
Platelet Count: 63 10*3/uL — ABNORMAL LOW (ref 150–400)
RBC: 4.02 MIL/uL (ref 3.87–5.11)
RDW: 13 % (ref 11.5–15.5)
WBC Count: 7.6 10*3/uL (ref 4.0–10.5)
nRBC: 0 % (ref 0.0–0.2)

## 2022-10-01 LAB — FIBRINOGEN: Fibrinogen: <60 mg/dL — CL (ref 210–475)

## 2022-10-01 LAB — CMP (CANCER CENTER ONLY)
ALT: 35 U/L (ref 0–44)
AST: 65 U/L — ABNORMAL HIGH (ref 15–41)
Albumin: 4.3 g/dL (ref 3.5–5.0)
Alkaline Phosphatase: 73 U/L (ref 38–126)
Anion gap: 9 (ref 5–15)
BUN: 16 mg/dL (ref 8–23)
CO2: 25 mmol/L (ref 22–32)
Calcium: 10.3 mg/dL (ref 8.9–10.3)
Chloride: 102 mmol/L (ref 98–111)
Creatinine: 0.71 mg/dL (ref 0.44–1.00)
GFR, Estimated: >60 mL/min (ref >60)
Glucose, Bld: 102 mg/dL — ABNORMAL HIGH (ref 70–99)
Potassium: 4.4 mmol/L (ref 3.5–5.1)
Sodium: 136 mmol/L (ref 135–145)
Total Bilirubin: 0.4 mg/dL (ref 0.3–1.2)
Total Protein: 7.1 g/dL (ref 6.5–8.1)

## 2022-10-01 LAB — APTT: aPTT: 36 seconds (ref 24–36)

## 2022-10-01 LAB — PROTIME-INR
INR: 1.9 — ABNORMAL HIGH (ref 0.8–1.2)
Prothrombin Time: 21.7 seconds — ABNORMAL HIGH (ref 11.4–15.2)

## 2022-10-01 MED ORDER — LETROZOLE 2.5 MG PO TABS: 2.5000 mg | ORAL_TABLET | Freq: Every day | ORAL | 0 refills | Status: DC

## 2022-10-01 NOTE — Progress Notes (Signed)
Critical Fibrinogen <60 called and read back to Maurine Simmering, RN at 1017 am by Dorian Furnace (MT) 10/01/2022

## 2022-10-01 NOTE — Progress Notes (Signed)
Patient Care Team: [name], MD as PCP - General (Internal Medicine) Stark Klein, MD as Consulting Physician (General Surgery) Eppie Gibson, MD as Attending Physician (Radiation Oncology) Nunzio Cobbs, MD as Consulting Physician (Obstetrics and Gynecology) Rolm Bookbinder, MD as Consulting Physician (Dermatology) Ardis Hughs, MD as Consulting Physician (Urology) Juanita Craver, MD as Consulting Physician (Gastroenterology) Renette Butters, MD as Attending Physician (Orthopedic Surgery) Nicholas Lose, MD as Consulting Physician (Hematology and Oncology)  DIAGNOSIS:  Encounter Diagnosis  Name Primary?   Malignant neoplasm of lower-inner quadrant of right breast of female, estrogen receptor positive ([place]) Yes    SUMMARY OF ONCOLOGIC HISTORY: Oncology History  Malignant neoplasm of lower-inner quadrant of right breast of female, estrogen receptor positive ([place])  07/06/2019 Initial Diagnosis   Malignant neoplasm of lower-inner quadrant of right breast of female, estrogen receptor positive ([place])   08/04/2019 Genetic Testing   Negative genetic testing:  No pathogenic variants detected on the [facility] Breast Cancer STAT panel or the Common Hereditary Cancers panel. The report date is 08/04/2019.  The STAT Breast cancer panel offered by [facility] includes sequencing and rearrangement analysis for the following 9 genes:  ATM, BRCA1, BRCA2, CDH1, CHEK2, PALB2, PTEN, STK11 and TP53.  The Common Hereditary Cancers Panel offered by [facility] includes sequencing and/or deletion duplication testing of the following 48 genes: APC, ATM, AXIN2, BARD1, BMPR1A, BRCA1, BRCA2, BRIP1, CDH1, CDK4, CDKN2A (p14ARF), CDKN2A (p16INK4a), CHEK2, CTNNA1, DICER1, EPCAM (Deletion/duplication testing only), GREM1 (promoter region deletion/duplication testing only), KIT, MEN1, MLH1, MSH2, MSH3, MSH6, MUTYH, NBN, NF1, NHTL1, PALB2, PDGFRA, PMS2, POLD1, POLE, PTEN, RAD50, RAD51C, RAD51D, RNF43, SDHB, SDHC,  SDHD, SMAD4, SMARCA4. STK11, TP53, TSC1, TSC2, and VHL.  The following genes were evaluated for sequence changes only: SDHA and HOXB13 c.251G>A variant only.    10/04/2019 Cancer Staging   Staging form: Breast, AJCC 8th Edition - Pathologic stage from 10/04/2019: Stage IIB (pT2, pN1a(sn), cM0, G2, ER+, PR-, HER2-) - Signed by Gardenia Phlegm, NP on 10/18/2019     CHIEF COMPLIANT: Follow-up Estrogen receptor positive breast cancer.  Review scans  INTERVAL HISTORY: Theresa [name] is a 67 y.o female with the above-mentioned Estrogen receptor positive breast cancer. She presents to the clinic for a follow-up to discuss treatment plan. She denies having any pain or any other symptoms.  She had developed easy bruising on her legs.  This was what brought her back to the clinic for evaluation.  We had previously stopped all of her medications.   ALLERGIES:  is allergic to metronidazole, other, and tape.  MEDICATIONS:  Current Outpatient Medications  Medication Sig Dispense Refill   gabapentin (NEURONTIN) 300 MG capsule Take 1 capsule (300 mg total) by mouth at bedtime. 90 capsule 4   hydrocortisone 2.5 % cream Apply topically 3 (three) times daily as needed. 28 g 3   letrozole (FEMARA) 2.5 MG tablet Take 1 tablet (2.5 mg total) by mouth daily. 30 tablet 0   loperamide (IMODIUM) 2 MG capsule Take 2 mg by mouth in the morning and at bedtime.      triamcinolone (KENALOG) 0.025 % ointment Apply 1 Application topically 2 (two) times daily. Use for one week at a time. 30 g 1   valsartan (DIOVAN) 40 MG tablet Take by mouth.     venlafaxine XR (EFFEXOR-XR) 37.5 MG 24 hr capsule Take 1 capsule (37.5 mg total) by mouth daily. 90 capsule 4   No current facility-administered medications for this visit.  PHYSICAL EXAMINATION: ECOG PERFORMANCE STATUS: 1 - Symptomatic but completely ambulatory  Vitals:   10/01/22 0821  BP: (!) 164/89  Pulse: 95  Resp: 17  Temp: 97.7 F (36.5 C)  SpO2: 98%    Filed Weights   10/01/22 0821  Weight: 152 lb 6 oz (69.1 kg)      LABORATORY DATA:  I have reviewed the data as listed    Latest Ref Rng & Units 10/01/2022    7:54 AM 09/28/2022    2:53 PM 06/30/2022    2:22 PM  CMP  Glucose 70 - 99 mg/dL 102  91  91   BUN 8 - 23 mg/dL '16  16  11   '$ Creatinine 0.44 - 1.00 mg/dL 0.71  0.88  1.01   Sodium 135 - 145 mmol/L 136  136  139   Potassium 3.5 - 5.1 mmol/L 4.4  4.6  4.2   Chloride 98 - 111 mmol/L 102  101  104   CO2 22 - 32 mmol/L '25  25  26   '$ Calcium 8.9 - 10.3 mg/dL 10.3  11.0  9.2   Total Protein 6.5 - 8.1 g/dL 7.1  7.6  6.7   Total Bilirubin 0.3 - 1.2 mg/dL 0.4  0.4  0.3   Alkaline Phos 38 - 126 U/L 73  74  55   AST 15 - 41 U/L 65  81  27   ALT 0 - 44 U/L 35  45  16     Lab Results  Component Value Date   WBC 7.6 10/01/2022   HGB 12.2 10/01/2022   HCT 36.6 10/01/2022   MCV 91.0 10/01/2022   PLT 63 (L) 10/01/2022   NEUTROABS 4.4 10/01/2022    ASSESSMENT & PLAN:  Malignant neoplasm of lower-inner quadrant of right breast of female, estrogen receptor positive ([place]) 07/03/2019: Right breast LIQ T1CN0 stage Ia grade 2 IDC ER 95% PR negative HER2 negative Ki-67 5% 10/04/2019: Right lumpectomy: T2N1 stage IIb grade 2 IDC positive lymphovascular invasion 3 lymph nodes removed 1 with macro N1 micrometastatic disease 10/04/2019: MammaPrint: Low risk 01/05/2020: Completed adjuvant radiation 08/04/2019: Genetics: Negative November 2020: Tamoxifen ------------------------------------------------------------------- 09/28/2022: Patient presented with platelets 65 INR 1.9 elevated LFTs and hypercalcemia  CT CAP 09/29/2022: Mediastinal and hilar lymphadenopathy suggestive of metastatic disease.  Patchy tree-in-bud nodularity in the lungs, partially necrotic mass right hepatic lobe 12 cm, also 2.5 cm lesion lytic bone metastases in spine pelvis and left scapula   Recommendation: Liver biopsy to determine breast prognostic panel and for Caris  molecular testing Follow-up after the biopsy to discuss treatment plan. 3.  PET CT scan will also be obtained.  I discussed with her about different treatment options depending on breast prognostic panel. Return to clinic after the biopsy to start her treatment.  Orders Placed This Encounter  Procedures   NM PET Image Restag (PS) Skull Base To Thigh    Standing Status:   Future    Standing Expiration Date:   10/01/2023    Order Specific Question:   If indicated for the ordered procedure, I authorize the administration of a radiopharmaceutical per Radiology protocol    Answer:   Yes    Order Specific Question:   Preferred imaging location?    Answer:   [facility]   US BIOPSY (LIVER)    Standing Status:   Future    Standing Expiration Date:   10/02/2023    Scheduling Instructions:     D/W Dr.Schick (He approved  it). Please expedite. Patient needs treatment asap.    Order Specific Question:   Lab orders requested (DO NOT place separate lab orders, these will be automatically ordered during procedure specimen collection):    Answer:   Surgical Pathology    Order Specific Question:   Reason for Exam (SYMPTOM  OR DIAGNOSIS REQUIRED)    Answer:   liver mets biopsy for met breast cancer    Order Specific Question:   Preferred location?    Answer:   [facility]   The patient has a good understanding of the overall plan. she agrees with it. she will call with any problems that may develop before the next visit here. Total time spent: 30 mins including face to face time and time spent for planning, charting and co-ordination of care   Harriette Ohara, MD 10/01/22    I Gardiner Coins am acting as a Education administrator for [employer]  I have reviewed the above documentation for accuracy and completeness, and I agree with the above.

## 2022-10-01 NOTE — Telephone Encounter (Signed)
CRITICAL VALUE STICKER  CRITICAL VALUE: Fibrinogen <60  RECEIVER (on-site recipient of call): Maurine Simmering CMA  DATE & TIME NOTIFIED: 10/01/2022 1019  MESSENGER (representative from lab): Rosann Auerbach in Lab  MD NOTIFIED: Dr. Christiane Ha  TIME OF NOTIFICATION: 8453  RESPONSE: Made nurse and Physician aware

## 2022-10-02 ENCOUNTER — Telehealth: Payer: Self-pay | Admitting: Hematology and Oncology

## 2022-10-02 ENCOUNTER — Other Ambulatory Visit: Payer: Self-pay | Admitting: *Deleted

## 2022-10-02 DIAGNOSIS — C50311 Malignant neoplasm of lower-inner quadrant of right female breast: Secondary | ICD-10-CM

## 2022-10-02 NOTE — Telephone Encounter (Signed)
Scheduled appointment per secure chat. Left voicemail.

## 2022-10-05 ENCOUNTER — Other Ambulatory Visit: Payer: Self-pay | Admitting: Internal Medicine

## 2022-10-05 DIAGNOSIS — N63 Unspecified lump in unspecified breast: Secondary | ICD-10-CM

## 2022-10-05 NOTE — H&P (Signed)
Chief Complaint: Elevated LFT's, elevated INR, thrombocytopenia. Request is for liver biopsy for further evaluation breast prognostic panel and for Caris molecular testing  Referring Physician(s): Gudena,Vinay  Supervising Physician: Juliet Rude  Patient Status: [place] - Out-pt  History of Present Illness: Theresa [name] is a 67 y.o. female outpatient. History of HTN, Breast cancer (right). Found to have elevated liver function, INR and thrombocytopenia.Team is requesting liver biopsy for further evaluation breast prognostic panel and for Caris molecular testing. Case approved by IR Attending Dr. Vernia Buff  Currently without any significant complaints. Patient alert and laying in bed,calm. Denies any fevers, headache, chest pain, SOB, cough, abdominal pain, nausea, vomiting or bleeding. Return precautions and treatment recommendations and follow-up discussed with the patient *** who is agreeable with the plan.   Past Medical History:  Diagnosis Date   Breast cancer, right ([place])    Contact lens/glasses fitting    wears contacts or glasses   Diarrhea    --post gallbladder surgery   Family history of bone cancer    Family history of brain cancer    Family history of stomach cancer    Family history of uterine cancer    Fibroids    History of radiation therapy    Hypertension    No pertinent past medical history    Personal history of radiation therapy    PONV (postoperative nausea and vomiting)    Right ovarian cyst    Vertigo     Past Surgical History:  Procedure Laterality Date   BLADDER SUSPENSION     BREAST BIOPSY  09/08/2012   Procedure: BREAST BIOPSY;  Surgeon: Odis Hollingshead, MD;  Location: [facility];  Service: General;  Laterality: Left;  remove left breast mass   BREAST LUMPECTOMY Right 10/2019   BREAST LUMPECTOMY WITH RADIOACTIVE SEED AND SENTINEL LYMPH NODE BIOPSY Right 10/04/2019   Procedure: RIGHT BREAST LUMPECTOMY WITH BRACKETED RADIOACTIVE  SEEDS, RIGHT BREAST RADIOACTIVE SEED GUIDED EXCISION BIOPSY, AND RIGHT SENTINEL LYMPH NODE BIOPSY;  Surgeon: Stark Klein, MD;  Location: [facility];  Service: General;  Laterality: Right;   BREAST SURGERY     lumpectomy x2   BUNIONECTOMY Right 01/2016   CHOLECYSTECTOMY     COLONOSCOPY     CYSTOSCOPY N/A 01/16/2020   Procedure: CYSTOSCOPY;  Surgeon: Nunzio Cobbs, MD;  Location: [facility];  Service: Gynecology;  Laterality: N/A;   MASTOPEXY Right 10/04/2019   Procedure: RIGHT BREAST MASTOPEXY;  Surgeon: Stark Klein, MD;  Location: [facility];  Service: General;  Laterality: Right;   RE-EXCISION OF BREAST LUMPECTOMY Right 10/31/2019   Procedure: RIGHT RE-EXCISION OF BREAST LUMPECTOMY;  Surgeon: Stark Klein, MD;  Location: [facility];  Service: General;  Laterality: Right;   TOTAL LAPAROSCOPIC HYSTERECTOMY WITH BILATERAL SALPINGO OOPHORECTOMY N/A 01/16/2020   Procedure: TOTAL LAPAROSCOPIC HYSTERECTOMY WITH BILATERAL SALPINGO OOPHORECTOMY/COLLECTION OF PELVIC WASHINGS, LYSIS OF ADHESIONS;  Surgeon: Nunzio Cobbs, MD;  Location: [facility];  Service: Gynecology;  Laterality: N/A;  collection of pelvic washings   TUBAL LIGATION      Allergies: Metronidazole, Other, and Tape  Medications: Prior to Admission medications   Medication Sig Start Date End Date Taking? Authorizing Provider  gabapentin (NEURONTIN) 300 MG capsule Take 1 capsule (300 mg total) by mouth at bedtime. 06/30/22   Nicholas Lose, MD  hydrocortisone 2.5 % cream Apply topically 3 (three) times daily as needed. 12/11/19  Eppie Gibson, MD  letrozole [facility]) 2.5 MG tablet Take 1 tablet (2.5 mg total) by mouth daily. 10/01/22   Nicholas Lose, MD  loperamide (IMODIUM) 2 MG capsule Take 2 mg by mouth in the morning and at bedtime.     [provider]  triamcinolone (KENALOG) 0.025 % ointment Apply 1 Application topically 2  (two) times daily. Use for one week at a time. 07/08/22   Nunzio Cobbs, MD  valsartan (DIOVAN) 40 MG tablet Take by mouth. 05/06/21   [provider]  venlafaxine XR (EFFEXOR-XR) 37.5 MG 24 hr capsule Take 1 capsule (37.5 mg total) by mouth daily. 02/19/22   Benay Pike, MD     Family History  Problem Relation Age of Onset   Hypertension Father    Stomach cancer Father        may have been colon, diagnosed in his 55s   Depression Mother    Dementia Mother    Thyroid disease Sister    Heart disease Sister    Brain cancer Maternal Aunt 45   Cancer Maternal Grandmother        undetermined type, diagnosed in her 18s   Bone cancer Paternal Grandfather 85   Uterine cancer Paternal Aunt 78   Uterine cancer Cousin        paternal 1st cousin, diagnosed in her late 64s   Uterine cancer Cousin 43       paternal 1st cousin    Social History   Socioeconomic History   Marital status: Married    Spouse name: Daryl    Number of children: Not on file   Years of education: Not on file   Highest education level: Not on file  Occupational History   Not on file  Tobacco Use   Smoking status: Former    Types: Cigarettes    Quit date: 03/15/1996    Years since quitting: 26.5   Smokeless tobacco: Never  Vaping Use   Vaping Use: Never used  Substance and Sexual Activity   Alcohol use: Yes    Alcohol/week: 0.0 standard drinks of alcohol    Comment: occ wine   Drug use: No   Sexual activity: Yes    Partners: Male    Birth control/protection: Surgical    Comment: BTL/hyst  Other Topics Concern   Not on file  Social History Narrative   Not on file   Social Determinants of Health   Financial Resource Strain: Not on file  Food Insecurity: Not on file  Transportation Needs: Not on file  Physical Activity: Not on file  Stress: Not on file  Social Connections: Not on file    ECOG Status: {CHL ONC ECOG OZ:[id number]}  Review of Systems: A 12 point ROS discussed  and pertinent positives are indicated in the HPI above.  All other systems are negative.  Review of Systems  Vital Signs: LMP 09/01/2007 (Exact Date)   Advance Care Plan: {Advance Care QVZD:[id number]}    Physical Exam  Imaging: CT CHEST ABDOMEN PELVIS W CONTRAST  Result Date: 09/29/2022 CLINICAL DATA:  History of breast cancer. Patient with elevated liver function studies, abnormal blood counts and hypercalcemia. * Tracking Code: BO * EXAM: CT CHEST, ABDOMEN, AND PELVIS WITH CONTRAST TECHNIQUE: Multidetector CT imaging of the chest, abdomen and pelvis was performed following the standard protocol during bolus administration of intravenous contrast. RADIATION DOSE REDUCTION: This exam was performed according to the departmental dose-optimization program which includes automated exposure control, adjustment of the  mA and/or kV according to patient size and/or use of iterative reconstruction technique. CONTRAST:  177m OMNIPAQUE IOHEXOL 300 MG/ML  SOLN COMPARISON:  None Available. FINDINGS: CT CHEST FINDINGS Cardiovascular: The heart is normal in size. No pericardial effusion. The aorta is normal in caliber. No dissection. Mediastinum/Nodes: Mediastinal and hilar lymphadenopathy suggesting metastatic disease. Subcarinal nodal mass measures 3.1 x 2.2 cm. The esophagus is grossly normal. Lungs/Pleura: Patchy tree-in-bud type nodularity in the lungs suggesting chronic inflammation or atypical infection such as MAC. I do not see any definite pulmonary metastatic nodules. No pleural effusions or pleural nodules. Musculoskeletal: Surgical changes involving the right medial breast. No obvious recurrent breast mass or chest wall mass. No enlarged axillary or supraclavicular lymph nodes. Scattered lytic bone lesions consistent with osseous metastatic disease. Left inferior scapular lytic lesion. CT ABDOMEN PELVIS FINDINGS Hepatobiliary: Large partially necrotic mass involving most of the right hepatic lobe and  measuring 12 x 11 x 6.5 cm. There is also a 2.5 cm lesion and segment 3 of the liver. Findings consistent with metastatic disease. No biliary dilatation. The gallbladder is surgically absent. Pancreas: No mass, inflammation or ductal dilatation. Spleen: Normal size. Small enhancing lesions spleen most likely a benign splenic hemangioma. Adrenals/Urinary Tract: No adrenal gland metastasis. Both kidneys are normal. Bladder is unremarkable. Stomach/Bowel: The stomach, duodenum, small and colon. Vascular/Lymphatic: The aorta is normal in caliber. No dissection. The branch vessels are patent. The major venous structures are patent. No mesenteric or retroperitoneal mass or adenopathy. Small scattered lymph nodes are noted. Reproductive: Surgically absent. Other: No pelvic mass or adenopathy. No free pelvic fluid collections. No inguinal mass or adenopathy. No abdominal wall hernia or subcutaneous lesions. Musculoskeletal: Lytic metastatic bone disease involving the lumbar spine and pelvis. 4 cm lytic lesion in the left iliac bone. Lytic S1 sacral lesion with adjacent sclerotic lesion. Lytic lesions involving L3 and L4. IMPRESSION: 1. Surgical changes involving the right medial breast. No obvious recurrent breast mass or chest wall mass. 2. Mediastinal and hilar lymphadenopathy suggesting metastatic disease. 3. Patchy tree-in-bud type nodularity in the lungs suggesting chronic inflammation or atypical infection such as MAC. No definite pulmonary metastatic nodules. 4. Large partially necrotic mass involving most of the right hepatic lobe and a 2.5 cm lesion in segment 3 of the liver consistent with metastatic disease. 5. 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 Electronically Signed   By: PMarijo SanesM.D.   On: 09/29/2022 12:43    Labs:  CBC: Recent Labs    12/10/21 1301 06/30/22 1422 09/28/22 1453 10/01/22 0754  WBC 7.3 7.0 7.9 7.6  HGB 12.0 11.4* 12.4 12.2  HCT  36.2 33.4* 37.0 36.6  PLT 162 135* 65* 63*    COAGS: Recent Labs    09/28/22 1453 10/01/22 0754  INR 1.9* 1.9*  APTT 37* 36    BMP: Recent Labs    12/10/21 1301 06/30/22 1422 09/28/22 1453 10/01/22 0754  NA 137 139 136 136  K 4.4 4.2 4.6 4.4  CL 104 104 101 102  CO2 '26 26 25 25  '$ GLUCOSE 104* 91 91 102*  BUN '13 11 16 16  '$ CALCIUM 9.5 9.2 11.0* 10.3  CREATININE 0.75 1.01* 0.88 0.71  GFRNONAA >60 >60 >60 >60    LIVER FUNCTION TESTS: Recent Labs    12/10/21 1301 06/30/22 1422 09/28/22 1453 10/01/22 0754  BILITOT 0.3 0.3 0.4 0.4  AST 20 27 81* 65*  ALT 13 16 45* 35  ALKPHOS  53 55 74 73  PROT 7.0 6.7 7.6 7.1  ALBUMIN 4.4 4.2 4.6 4.3    Assessment and Plan:  67 y.o. female outpatient. History of HTN, Breast cancer (right). Found to have elevated liver function, INR and thrombocytopenia.Team is requesting liver biopsy for further evaluation breast prognostic panel and for Caris molecular testing. Case approved by IR Attending Dr. Vernia Buff   *** All labs and medications are within acceptable parameters. Allergies include metronidazole, dermabond and tape.   Risks and benefits of liver biopsy was discussed with the patient and/or patient's family including, but not limited to bleeding, infection, damage to adjacent structures or low yield requiring additional tests.  All of the questions were answered and there is agreement to proceed.  Consent signed and in chart.   Thank you for this interesting consult.  I greatly enjoyed meeting QUEENIE AUFIERO and look forward to participating in their care.  A copy of this report was sent to the requesting provider on this date.  Electronically Signed: Jacqualine Mau, NP 10/05/2022, 5:15 PM   I spent a total of  30 Minutes   in face to face in clinical consultation, greater than 50% of which was counseling/coordinating care for liver biopsy

## 2022-10-05 NOTE — Progress Notes (Signed)
Shick, Michael, MD  Seleta Hovland D Ok for US liver met core bx   TS         

## 2022-10-05 NOTE — Progress Notes (Signed)
Patient for Theresa [name] guided Core Liver Mass Biopsy on Tues 10/06/2022, I called and spoke with the  patient on the phone and gave pre-procedure instructions. Pt was made aware to be here at 7:30a at the new entrance, NPO after MN prior to procedure as well as driver post procedure/recovery/discharge. Pt stated understanding.  Called 10/05/2022

## 2022-10-06 ENCOUNTER — Ambulatory Visit
Admission: RE | Admit: 2022-10-06 | Discharge: 2022-10-06 | Disposition: A | Discharge status: Home / Self Care | Payer: Medicare HMO | Source: Ambulatory Visit | Attending: Hematology and Oncology | Admitting: Hematology and Oncology

## 2022-10-06 ENCOUNTER — Other Ambulatory Visit: Payer: Self-pay

## 2022-10-06 DIAGNOSIS — K769 Liver disease, unspecified: Secondary | ICD-10-CM | POA: Insufficient documentation

## 2022-10-06 DIAGNOSIS — Z853 Personal history of malignant neoplasm of breast: Secondary | ICD-10-CM | POA: Insufficient documentation

## 2022-10-06 DIAGNOSIS — Z808 Family history of malignant neoplasm of other organs or systems: Secondary | ICD-10-CM | POA: Diagnosis not present

## 2022-10-06 DIAGNOSIS — Z170 Estrogen receptor positive status [ER+]: Secondary | ICD-10-CM | POA: Insufficient documentation

## 2022-10-06 DIAGNOSIS — I10 Essential (primary) hypertension: Secondary | ICD-10-CM | POA: Insufficient documentation

## 2022-10-06 DIAGNOSIS — Z800 Family history of malignant neoplasm of digestive organs: Secondary | ICD-10-CM | POA: Diagnosis not present

## 2022-10-06 DIAGNOSIS — D696 Thrombocytopenia, unspecified: Secondary | ICD-10-CM | POA: Insufficient documentation

## 2022-10-06 DIAGNOSIS — Z8049 Family history of malignant neoplasm of other genital organs: Secondary | ICD-10-CM | POA: Insufficient documentation

## 2022-10-06 DIAGNOSIS — C50311 Malignant neoplasm of lower-inner quadrant of right female breast: Secondary | ICD-10-CM | POA: Diagnosis not present

## 2022-10-06 DIAGNOSIS — R7989 Other specified abnormal findings of blood chemistry: Secondary | ICD-10-CM | POA: Diagnosis not present

## 2022-10-06 DIAGNOSIS — N63 Unspecified lump in unspecified breast: Secondary | ICD-10-CM

## 2022-10-06 LAB — COMPREHENSIVE METABOLIC PANEL
ALT: 34 U/L (ref 0–44)
AST: 75 U/L — ABNORMAL HIGH (ref 15–41)
Albumin: 4 g/dL (ref 3.5–5.0)
Alkaline Phosphatase: 59 U/L (ref 38–126)
Anion gap: 9 (ref 5–15)
BUN: 15 mg/dL (ref 8–23)
CO2: 22 mmol/L (ref 22–32)
Calcium: 9.5 mg/dL (ref 8.9–10.3)
Chloride: 105 mmol/L (ref 98–111)
Creatinine, Ser: 0.68 mg/dL (ref 0.44–1.00)
GFR, Estimated: >60 mL/min (ref >60)
Glucose, Bld: 110 mg/dL — ABNORMAL HIGH (ref 70–99)
Potassium: 4.3 mmol/L (ref 3.5–5.1)
Sodium: 136 mmol/L (ref 135–145)
Total Bilirubin: 0.6 mg/dL (ref 0.3–1.2)
Total Protein: 6.7 g/dL (ref 6.5–8.1)

## 2022-10-06 LAB — CBC WITH DIFFERENTIAL/PLATELET
Abs Immature Granulocytes: 0.09 10*3/uL — ABNORMAL HIGH (ref 0.00–0.07)
Basophils Absolute: 0 10*3/uL (ref 0.0–0.1)
Basophils Relative: 1 %
Eosinophils Absolute: 0.2 10*3/uL (ref 0.0–0.5)
Eosinophils Relative: 2 %
HCT: 32.7 % — ABNORMAL LOW (ref 36.0–46.0)
Hemoglobin: 10.6 g/dL — ABNORMAL LOW (ref 12.0–15.0)
Immature Granulocytes: 1 %
Lymphocytes Relative: 31 %
Lymphs Abs: 2.1 10*3/uL (ref 0.7–4.0)
MCH: 29.4 pg (ref 26.0–34.0)
MCHC: 32.4 g/dL (ref 30.0–36.0)
MCV: 90.8 fL (ref 80.0–100.0)
Monocytes Absolute: 0.5 10*3/uL (ref 0.1–1.0)
Monocytes Relative: 7 %
Neutro Abs: 3.9 10*3/uL (ref 1.7–7.7)
Neutrophils Relative %: 58 %
Platelets: 57 10*3/uL — ABNORMAL LOW (ref 150–400)
RBC: 3.6 MIL/uL — ABNORMAL LOW (ref 3.87–5.11)
RDW: 13.4 % (ref 11.5–15.5)
Smear Review: NORMAL
WBC: 6.7 10*3/uL (ref 4.0–10.5)
nRBC: 0 % (ref 0.0–0.2)

## 2022-10-06 LAB — PROTIME-INR
INR: 1.8 — ABNORMAL HIGH (ref 0.8–1.2)
Prothrombin Time: 20.6 seconds — ABNORMAL HIGH (ref 11.4–15.2)

## 2022-10-06 MED ORDER — HYDROMORPHONE HCL 1 MG/ML IJ SOLN
0.5000 mg | Freq: Once | INTRAMUSCULAR | Status: AC
  Administered 2022-10-06: 0.5 mg via INTRAVENOUS

## 2022-10-06 MED ORDER — SODIUM CHLORIDE 0.9 % IV SOLN: INTRAVENOUS | Status: DC

## 2022-10-06 MED ORDER — MIDAZOLAM HCL 5 MG/5ML IJ SOLN
INTRAMUSCULAR | Status: AC | PRN
  Administered 2022-10-06: 1 mg via INTRAVENOUS

## 2022-10-06 MED ORDER — MIDAZOLAM HCL 2 MG/2ML IJ SOLN
INTRAMUSCULAR | Status: AC
  Filled 2022-10-06: qty 2

## 2022-10-06 MED ORDER — HYDROMORPHONE HCL 1 MG/ML IJ SOLN
INTRAMUSCULAR | Status: AC
  Filled 2022-10-06: qty 0.5

## 2022-10-06 MED ORDER — FENTANYL CITRATE (PF) 100 MCG/2ML IJ SOLN
INTRAMUSCULAR | Status: AC | PRN
  Administered 2022-10-06 (×2): 50 ug via INTRAVENOUS

## 2022-10-06 MED ORDER — LIDOCAINE HCL (PF) 1 % IJ SOLN
10.0000 mL | Freq: Once | INTRAMUSCULAR | Status: AC
  Administered 2022-10-06: 10 mL via INTRADERMAL

## 2022-10-06 MED ORDER — FENTANYL CITRATE (PF) 100 MCG/2ML IJ SOLN
INTRAMUSCULAR | Status: AC
  Filled 2022-10-06: qty 2

## 2022-10-06 MED ORDER — MIDAZOLAM HCL 2 MG/2ML IJ SOLN
INTRAMUSCULAR | Status: AC | PRN
  Administered 2022-10-06: 1 mg via INTRAVENOUS

## 2022-10-06 NOTE — Procedures (Signed)
Interventional Radiology Procedure Note  Date of Procedure: 10/06/2022  Procedure: Liver mas biopsy   Findings:  1. US liver mas biposy right lobe 19ga cores x5 passes   Complications: No immediate complications noted.   Estimated Blood Loss: minimal  Follow-up and Recommendations: 1. Bedrest 2 hours, 1sr hour right side down    Albin Felling, MD  Vascular & Interventional Radiology  10/06/2022 9:45 AM

## 2022-10-06 NOTE — Progress Notes (Signed)
Patient clinically stable post Liver biopsy per DR El-Abd, tolerated well. Denies complaints post procedure.vitals stable pre and post procedure.  Received Versed 2 mg along with Fentanyl 100 mcg IV for procedure . Report given to Kathrin Ruddy RN post procedure/331

## 2022-10-07 ENCOUNTER — Inpatient Hospital Stay: Payer: Medicare HMO

## 2022-10-07 ENCOUNTER — Encounter: Payer: Self-pay | Admitting: *Deleted

## 2022-10-07 ENCOUNTER — Telehealth: Payer: Self-pay | Admitting: Hematology and Oncology

## 2022-10-07 ENCOUNTER — Other Ambulatory Visit: Payer: Self-pay

## 2022-10-07 DIAGNOSIS — C50311 Malignant neoplasm of lower-inner quadrant of right female breast: Secondary | ICD-10-CM

## 2022-10-07 LAB — CBC WITH DIFFERENTIAL (CANCER CENTER ONLY)
Abs Immature Granulocytes: 0.08 10*3/uL — ABNORMAL HIGH (ref 0.00–0.07)
Basophils Absolute: 0 10*3/uL (ref 0.0–0.1)
Basophils Relative: 0 %
Eosinophils Absolute: 0 10*3/uL (ref 0.0–0.5)
Eosinophils Relative: 0 %
HCT: 30.6 % — ABNORMAL LOW (ref 36.0–46.0)
Hemoglobin: 10.2 g/dL — ABNORMAL LOW (ref 12.0–15.0)
Immature Granulocytes: 1 %
Lymphocytes Relative: 26 %
Lymphs Abs: 2.5 10*3/uL (ref 0.7–4.0)
MCH: 30.4 pg (ref 26.0–34.0)
MCHC: 33.3 g/dL (ref 30.0–36.0)
MCV: 91.3 fL (ref 80.0–100.0)
Monocytes Absolute: 0.6 10*3/uL (ref 0.1–1.0)
Monocytes Relative: 6 %
Neutro Abs: 6.4 10*3/uL (ref 1.7–7.7)
Neutrophils Relative %: 67 %
Platelet Count: 61 10*3/uL — ABNORMAL LOW (ref 150–400)
RBC: 3.35 MIL/uL — ABNORMAL LOW (ref 3.87–5.11)
RDW: 13.6 % (ref 11.5–15.5)
WBC Count: 9.7 10*3/uL (ref 4.0–10.5)
nRBC: 0.2 % (ref 0.0–0.2)

## 2022-10-07 LAB — CMP (CANCER CENTER ONLY)
ALT: 32 U/L (ref 0–44)
AST: 72 U/L — ABNORMAL HIGH (ref 15–41)
Albumin: 3.9 g/dL (ref 3.5–5.0)
Alkaline Phosphatase: 59 U/L (ref 38–126)
Anion gap: 10 (ref 5–15)
BUN: 12 mg/dL (ref 8–23)
CO2: 25 mmol/L (ref 22–32)
Calcium: 9.7 mg/dL (ref 8.9–10.3)
Chloride: 100 mmol/L (ref 98–111)
Creatinine: 0.73 mg/dL (ref 0.44–1.00)
GFR, Estimated: >60 mL/min (ref >60)
Glucose, Bld: 135 mg/dL — ABNORMAL HIGH (ref 70–99)
Potassium: 4.1 mmol/L (ref 3.5–5.1)
Sodium: 135 mmol/L (ref 135–145)
Total Bilirubin: 0.4 mg/dL (ref 0.3–1.2)
Total Protein: 6.9 g/dL (ref 6.5–8.1)

## 2022-10-07 LAB — PROTIME-INR
INR: 1.7 — ABNORMAL HIGH (ref 0.8–1.2)
Prothrombin Time: 19.5 seconds — ABNORMAL HIGH (ref 11.4–15.2)

## 2022-10-07 LAB — APTT: aPTT: 35 seconds (ref 24–36)

## 2022-10-07 LAB — FIBRINOGEN: Fibrinogen: 72 mg/dL — CL (ref 210–475)

## 2022-10-07 NOTE — Telephone Encounter (Signed)
I discussed the lab results with the patient. The INR, fibrinogen are slightly better Continues to be anemic hemoglobin of 10.2 g.  She will start taking iron supplement. We will await the results of her liver biopsy done yesterday.

## 2022-10-07 NOTE — Progress Notes (Unsigned)
Critical fibrinogen called to Heinz Knuckles, RN by Prudencio Burly [phone] 10/07/22

## 2022-10-08 ENCOUNTER — Ambulatory Visit (HOSPITAL_COMMUNITY)
Admission: RE | Admit: 2022-10-08 | Discharge: 2022-10-08 | Disposition: A | Discharge status: Home / Self Care | Payer: Medicare HMO | Source: Ambulatory Visit | Attending: Hematology and Oncology | Admitting: Hematology and Oncology

## 2022-10-08 DIAGNOSIS — Z170 Estrogen receptor positive status [ER+]: Secondary | ICD-10-CM | POA: Diagnosis not present

## 2022-10-08 DIAGNOSIS — C50311 Malignant neoplasm of lower-inner quadrant of right female breast: Secondary | ICD-10-CM | POA: Insufficient documentation

## 2022-10-08 MED ORDER — FLUDEOXYGLUCOSE F - 18 (FDG) INJECTION
7.1600 | Freq: Once | INTRAVENOUS | Status: AC | PRN
  Administered 2022-10-08: 7.16 via INTRAVENOUS

## 2022-10-08 NOTE — Progress Notes (Signed)
Patient seen by Dr. Tyrone Sage labs reviewed with him.

## 2022-10-12 ENCOUNTER — Other Ambulatory Visit: Payer: Self-pay

## 2022-10-12 ENCOUNTER — Other Ambulatory Visit (HOSPITAL_COMMUNITY): Payer: Self-pay

## 2022-10-12 ENCOUNTER — Inpatient Hospital Stay (HOSPITAL_BASED_OUTPATIENT_CLINIC_OR_DEPARTMENT_OTHER): Payer: Medicare HMO | Admitting: Adult Health

## 2022-10-12 ENCOUNTER — Other Ambulatory Visit: Payer: Self-pay | Admitting: Hematology and Oncology

## 2022-10-12 VITALS — BP 142/67 | HR 111 | Temp 97.8°F | Resp 18 | Ht 65.0 in | Wt 152.8 lb

## 2022-10-12 DIAGNOSIS — C50311 Malignant neoplasm of lower-inner quadrant of right female breast: Secondary | ICD-10-CM

## 2022-10-12 DIAGNOSIS — Z170 Estrogen receptor positive status [ER+]: Secondary | ICD-10-CM

## 2022-10-12 MED ORDER — ABEMACICLIB 100 MG PO TABS
100.0000 mg | ORAL_TABLET | Freq: Two times a day (BID) | ORAL | 1 refills | Status: DC
  Filled 2022-10-12: qty 70, 35d supply, fill #0
  Filled 2022-10-19: qty 56, 28d supply, fill #0
  Filled 2022-11-04: qty 56, 28d supply, fill #1

## 2022-10-12 MED ORDER — LORAZEPAM 0.5 MG PO TABS: 0.5000 mg | ORAL_TABLET | Freq: Three times a day (TID) | ORAL | 0 refills | Status: DC | PRN

## 2022-10-12 NOTE — Progress Notes (Signed)
Discussed with the patient that the path is Poorly diff Met breast cancer that's ER positive. I am sending for Verzinio to [facility] hospital

## 2022-10-13 ENCOUNTER — Inpatient Hospital Stay (HOSPITAL_BASED_OUTPATIENT_CLINIC_OR_DEPARTMENT_OTHER): Payer: Medicare HMO | Admitting: Hematology and Oncology

## 2022-10-13 ENCOUNTER — Other Ambulatory Visit: Payer: Self-pay

## 2022-10-13 ENCOUNTER — Telehealth: Payer: Self-pay | Admitting: Pharmacy Technician

## 2022-10-13 ENCOUNTER — Telehealth: Payer: Self-pay

## 2022-10-13 ENCOUNTER — Other Ambulatory Visit (HOSPITAL_COMMUNITY): Payer: Self-pay

## 2022-10-13 ENCOUNTER — Encounter: Payer: Self-pay | Admitting: Adult Health

## 2022-10-13 ENCOUNTER — Inpatient Hospital Stay: Payer: Medicare HMO

## 2022-10-13 DIAGNOSIS — C50311 Malignant neoplasm of lower-inner quadrant of right female breast: Secondary | ICD-10-CM | POA: Diagnosis not present

## 2022-10-13 DIAGNOSIS — Z170 Estrogen receptor positive status [ER+]: Secondary | ICD-10-CM

## 2022-10-13 LAB — CMP (CANCER CENTER ONLY)
ALT: 31 U/L (ref 0–44)
AST: 66 U/L — ABNORMAL HIGH (ref 15–41)
Albumin: 3.9 g/dL (ref 3.5–5.0)
Alkaline Phosphatase: 68 U/L (ref 38–126)
Anion gap: 9 (ref 5–15)
BUN: 13 mg/dL (ref 8–23)
CO2: 24 mmol/L (ref 22–32)
Calcium: 10.2 mg/dL (ref 8.9–10.3)
Chloride: 102 mmol/L (ref 98–111)
Creatinine: 0.71 mg/dL (ref 0.44–1.00)
GFR, Estimated: >60 mL/min (ref >60)
Glucose, Bld: 123 mg/dL — ABNORMAL HIGH (ref 70–99)
Potassium: 4.7 mmol/L (ref 3.5–5.1)
Sodium: 135 mmol/L (ref 135–145)
Total Bilirubin: 0.4 mg/dL (ref 0.3–1.2)
Total Protein: 6.3 g/dL — ABNORMAL LOW (ref 6.5–8.1)

## 2022-10-13 LAB — CBC WITH DIFFERENTIAL (CANCER CENTER ONLY)
Abs Immature Granulocytes: 0.06 10*3/uL (ref 0.00–0.07)
Basophils Absolute: 0 10*3/uL (ref 0.0–0.1)
Basophils Relative: 0 %
Eosinophils Absolute: 0.1 10*3/uL (ref 0.0–0.5)
Eosinophils Relative: 1 %
HCT: 30.7 % — ABNORMAL LOW (ref 36.0–46.0)
Hemoglobin: 10.2 g/dL — ABNORMAL LOW (ref 12.0–15.0)
Immature Granulocytes: 1 %
Lymphocytes Relative: 21 %
Lymphs Abs: 1.5 10*3/uL (ref 0.7–4.0)
MCH: 30 pg (ref 26.0–34.0)
MCHC: 33.2 g/dL (ref 30.0–36.0)
MCV: 90.3 fL (ref 80.0–100.0)
Monocytes Absolute: 0.5 10*3/uL (ref 0.1–1.0)
Monocytes Relative: 8 %
Neutro Abs: 4.9 10*3/uL (ref 1.7–7.7)
Neutrophils Relative %: 69 %
Platelet Count: 63 10*3/uL — ABNORMAL LOW (ref 150–400)
RBC: 3.4 MIL/uL — ABNORMAL LOW (ref 3.87–5.11)
RDW: 13.6 % (ref 11.5–15.5)
WBC Count: 7.1 10*3/uL (ref 4.0–10.5)
nRBC: 0.3 % — ABNORMAL HIGH (ref 0.0–0.2)

## 2022-10-13 LAB — PROTIME-INR
INR: 1.6 — ABNORMAL HIGH (ref 0.8–1.2)
Prothrombin Time: 19.2 seconds — ABNORMAL HIGH (ref 11.4–15.2)

## 2022-10-13 MED ORDER — LIDOCAINE HCL 2 % IJ SOLN: 20.0000 mL | Freq: Once | INTRAMUSCULAR | Status: DC

## 2022-10-13 MED ORDER — LIDOCAINE HCL 2 % IJ SOLN
INTRAMUSCULAR | Status: AC
  Filled 2022-10-13: qty 20

## 2022-10-13 NOTE — Progress Notes (Signed)
[facility] Follow up:    [name], MD [address] [place] [address]   DIAGNOSIS:  Cancer Staging  Malignant neoplasm of lower-inner quadrant of right breast of female, estrogen receptor positive ([place]) Staging form: Breast, AJCC 8th Edition - Clinical stage from 07/26/2019: Stage IA (cT1c, cN0, cM0, G2, ER+, PR-, HER2-) - Unsigned Stage prefix: Initial diagnosis Histologic grading system: 3 grade system Laterality: Right Staged by: Pathologist and managing physician Stage used in treatment planning: Yes National guidelines used in treatment planning: Yes Type of national guideline used in treatment planning: NCCN - Pathologic stage from 10/04/2019: Stage IIB (pT2, pN1a(sn), cM0, G2, ER+, PR-, HER2-) - Signed by Gardenia Phlegm, NP on 10/18/2019 Stage prefix: Initial diagnosis Method of lymph node assessment: Sentinel lymph node biopsy Histologic grading system: 3 grade system   SUMMARY OF ONCOLOGIC HISTORY: Oncology History  Malignant neoplasm of lower-inner quadrant of right breast of female, estrogen receptor positive ([place])  07/03/2019 Initial Diagnosis   right lower inner quadrant biopsy, for a clinicaly multiple T1c N0, stage IA invasive ductal carcinoma, grade 2, E-cadherin positive, strongly estrogen receptor positive but progesterone receptor negative and HER-2 not amplified, with an MIB-1 of 5%             (a) breast MRI 08/07/2019 showed 2 additional suspicious areas in the right breast and 1 in the left breast              (b) biopsy of all 3 areas 08/21/2019 showed no malignancy   07/26/2019 -  Anti-estrogen oral therapy   tamoxifen started neoadjuvantly 07/26/2019; to be continued for 10 years.  S/p TAH/BSO on 01/16/2020 with benign pathology   08/04/2019 Genetic Testing   Negative genetic testing:  No pathogenic variants detected on the [facility] Breast Cancer STAT panel or the Common Hereditary Cancers panel. The report date is  08/04/2019.  The STAT Breast cancer panel offered by [facility] includes sequencing and rearrangement analysis for the following 9 genes:  ATM, BRCA1, BRCA2, CDH1, CHEK2, PALB2, PTEN, STK11 and TP53.  The Common Hereditary Cancers Panel offered by [facility] includes sequencing and/or deletion duplication testing of the following 48 genes: APC, ATM, AXIN2, BARD1, BMPR1A, BRCA1, BRCA2, BRIP1, CDH1, CDK4, CDKN2A (p14ARF), CDKN2A (p16INK4a), CHEK2, CTNNA1, DICER1, EPCAM (Deletion/duplication testing only), GREM1 (promoter region deletion/duplication testing only), KIT, MEN1, MLH1, MSH2, MSH3, MSH6, MUTYH, NBN, NF1, NHTL1, PALB2, PDGFRA, PMS2, POLD1, POLE, PTEN, RAD50, RAD51C, RAD51D, RNF43, SDHB, SDHC, SDHD, SMAD4, SMARCA4. STK11, TP53, TSC1, TSC2, and VHL.  The following genes were evaluated for sequence changes only: SDHA and HOXB13 c.251G>A variant only.    10/04/2019 Cancer Staging   Staging form: Breast, AJCC 8th Edition - Pathologic stage from 10/04/2019: Stage IIB (pT2, pN1a(sn), cM0, G2, ER+, PR-, HER2-) - Signed by Gardenia Phlegm, NP on 10/18/2019   10/04/2019 Surgery    right lumpectomy and sentinel lymph node sampling 10/04/2019 showed a pT2 pN1, stage IIB invasive ductal carcinoma, grade 2, with positive lymphovascular invasion and a positive superior margin             (a) 3 sentinel lymph nodes removed, one with macro metastatic deposit, 1 with a micrometastatic deposit             (b) additional surgery for margin clearance   10/04/2019 Miscellaneous    MammaPrint on the 10/04/2019 sample shows a low risk luminal A tumor predicting a 5-year metastasis free survival of 96% without chemotherapy, and a  chemotherapy benefit of less than 1.5%    11/27/2019 - 01/05/2020 Radiation Therapy    Site Technique Total Dose (Gy) Dose per Fx (Gy) Completed Fx Beam Energies  Breast, Right: Breast_Rt 3D 50/50 2 25/25 6X, 10X  Breast, Right: Breast_Rt_SCV_PAB 3D 50/50 2 25/25 6X, 10X  Breast, Right:  Breast_Rt_Bst 3D 10/10 2 5/5 6X, 10X    09/29/2022 Imaging   IMPRESSION: 1. Surgical changes involving the right medial breast. No obvious recurrent breast mass or chest wall mass. 2. Mediastinal and hilar lymphadenopathy suggesting metastatic disease. 3. Patchy tree-in-bud type nodularity in the lungs suggesting chronic inflammation or atypical infection such as MAC. No definite pulmonary metastatic nodules. 4. Large partially necrotic mass involving most of the right hepatic lobe and a 2.5 cm lesion in segment 3 of the liver consistent with metastatic disease. 5. 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   10/06/2022 Initial Biopsy   Liver biopsy: + for malignancy, metastatic poorly differentiated carcinoma, likely breast origin, biomarkers pending, insufficient tissue for molecular testing. (Resulted in EPIC on 10/13/2022)   10/07/2022 PET scan   PET scan on 10/07/2022 that showed hypermetabolic metastatic breast cancer with hypermetabolic mediastinal and hilar adenopathy, hypermetabolic hepatic metastatic disease and bone disease.  There were no findings for pulmonary metastatic disease.     CURRENT THERAPY: Letrozole  INTERVAL HISTORY: ZAREENA YAU 67 y.o. female returns for f/u of her newly discovered metastatic breast cancer.  She underwent PET scan on 10/07/2022 that showed hypermetabolic metastatic breast cancer with hypermetabolic mediastinal and hilar adenopathy, hypermetabolic hepatic metastatic disease and bone disease.  There were no findings for pulmonary metastatic disease.  She tells me that she has been very anxious and tearful regarding the news of metastatic breast cancer.  Her pathology results were not available at the time of her appointment today.     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  MEDICAL HISTORY: Past Medical History:  Diagnosis Date   Breast cancer, right ([place])    Contact lens/glasses fitting    wears contacts or glasses   Diarrhea    --post gallbladder surgery   Family history of bone cancer    Family history of brain cancer    Family history of stomach cancer    Family history of uterine cancer    Fibroids    History of radiation therapy    Hypertension    No pertinent past medical history    Personal history of radiation therapy    PONV (postoperative nausea and vomiting)    Right ovarian cyst    Vertigo     SURGICAL HISTORY: Past Surgical History:  Procedure Laterality Date   BLADDER SUSPENSION     BREAST BIOPSY  09/08/2012   Procedure: BREAST BIOPSY;  Surgeon: Odis Hollingshead, MD;  Location: [facility];  Service: General;  Laterality: Left;  remove left breast  mass   BREAST LUMPECTOMY Right 10/2019   BREAST LUMPECTOMY WITH RADIOACTIVE SEED AND SENTINEL LYMPH NODE BIOPSY Right 10/04/2019   Procedure: RIGHT BREAST LUMPECTOMY WITH BRACKETED RADIOACTIVE SEEDS, RIGHT BREAST RADIOACTIVE SEED GUIDED EXCISION BIOPSY, AND RIGHT SENTINEL LYMPH NODE BIOPSY;  Surgeon: Stark Klein, MD;  Location: [facility];  Service: General;  Laterality: Right;   BREAST SURGERY     lumpectomy x2   BUNIONECTOMY Right 01/2016   CHOLECYSTECTOMY     COLONOSCOPY     CYSTOSCOPY N/A 01/16/2020   Procedure: CYSTOSCOPY;  Surgeon: Nunzio Cobbs, MD;  Location[facility];  Service: Gynecology;   Laterality: N/A;   MASTOPEXY Right 10/04/2019   Procedure: RIGHT BREAST MASTOPEXY;  Surgeon: Stark Klein, MD;  Location: [facility];  Service: General;  Laterality: Right;   RE-EXCISION OF BREAST LUMPECTOMY Right 10/31/2019   Procedure: RIGHT RE-EXCISION OF BREAST LUMPECTOMY;  Surgeon: Stark Klein, MD;  Location: [place];  Service: General;  Laterality: Right;   TOTAL LAPAROSCOPIC HYSTERECTOMY WITH BILATERAL SALPINGO OOPHORECTOMY N/A 01/16/2020   Procedure: TOTAL LAPAROSCOPIC HYSTERECTOMY WITH BILATERAL SALPINGO OOPHORECTOMY/COLLECTION OF PELVIC WASHINGS, LYSIS OF ADHESIONS;  Surgeon: Nunzio Cobbs, MD;  Location: [facility];  Service: Gynecology;  Laterality: N/A;  collection of pelvic washings   TUBAL LIGATION      SOCIAL HISTORY: Social History   Socioeconomic History   Marital status: Married    Spouse name: Daryl    Number of children: Not on file   Years of education: Not on file   Highest education level: Not on file  Occupational History   Not on file  Tobacco Use   Smoking status: Former    Types: Cigarettes    Quit date: 03/15/1996    Years since quitting: 26.5   Smokeless tobacco: Never  Vaping Use   Vaping Use: Never used  Substance and Sexual Activity   Alcohol use: Not Currently    Comment: occ wine   Drug use: No   Sexual activity: Yes    Partners: Male    Birth control/protection: Surgical    Comment: BTL/hyst  Other Topics Concern   Not on file  Social History Narrative   Not on file   Social Determinants of Health   Financial Resource Strain: Not on file  Food Insecurity: Not on file  Transportation Needs: Not on file  Physical Activity: Not on file  Stress: Not on file  Social Connections: Not on file  Intimate Partner Violence: Not on file    FAMILY HISTORY: Family History  Problem Relation Age of Onset   Hypertension Father    Stomach cancer Father        may have been colon,  diagnosed in his 93s   Depression Mother    Dementia Mother    Thyroid disease Sister    Heart disease Sister    Brain cancer Maternal Aunt 42   Cancer Maternal Grandmother        undetermined type, diagnosed in her 43s   Bone cancer Paternal Grandfather 23   Uterine cancer Paternal Aunt 31   Uterine cancer Cousin        paternal 1st cousin, diagnosed in her late 43s   Uterine cancer Cousin 43       paternal 1st cousin    Review of Systems  Constitutional:  Negative for appetite change, chills, fatigue, fever and unexpected weight change.  HENT:  Negative for hearing loss, lump/mass and trouble swallowing.   Eyes:  Negative for eye problems and icterus.  Respiratory:  Negative for chest tightness, cough and shortness of breath.   Cardiovascular:  Negative for chest pain, leg swelling and palpitations.  Gastrointestinal:  Negative for abdominal distention, abdominal pain, constipation, diarrhea, nausea and vomiting.  Endocrine: Negative for hot flashes.  Genitourinary:  Negative for difficulty urinating.   Musculoskeletal:  Negative for arthralgias.  Skin:  Negative for itching and rash.  Neurological:  Negative for dizziness, extremity weakness, headaches and numbness.  Hematological:  Negative for adenopathy. Does not bruise/bleed easily.  Psychiatric/Behavioral:  Negative for depression. The patient is nervous/anxious.       PHYSICAL EXAMINATION  ECOG PERFORMANCE STATUS: 1 - Symptomatic but completely ambulatory  Vitals:   10/12/22 1215  BP: (!) 142/67  Pulse: (!) 111  Resp: 18  Temp: 97.8 F (36.6 C)  SpO2: 98%    Physical Exam Constitutional:      General: She is not in acute distress.    Appearance: Normal appearance. She is not toxic-appearing.  HENT:     Head: Normocephalic and atraumatic.  Eyes:     General: No scleral icterus. Cardiovascular:     Rate and Rhythm: Normal rate and regular rhythm.     Pulses: Normal pulses.     Heart sounds: Normal  heart sounds.  Pulmonary:     Effort: Pulmonary effort is normal.     Breath sounds: Normal breath sounds.  Abdominal:     General: Abdomen is flat. Bowel sounds are normal. There is no distension.     Palpations: Abdomen is soft.     Tenderness: There is no abdominal tenderness.  Musculoskeletal:        General: No swelling.     Cervical back: Neck supple.  Lymphadenopathy:     Cervical: No cervical adenopathy.  Skin:    General: Skin is warm and dry.     Findings: No rash.  Neurological:     General: No focal deficit present.     Mental Status: She is alert.  Psychiatric:        Mood and Affect: Mood normal.        Behavior: Behavior normal.     LABORATORY DATA:  CBC    Component Value Date/Time   WBC 7.1 10/13/2022 0814   WBC 6.7 10/06/2022 0735   RBC 3.40 (L) 10/13/2022 0814   HGB 10.2 (L) 10/13/2022 0814   HGB 10.8 (L) 01/19/2020 0911   HGB 12.8 03/15/2013 1434   HCT 30.7 (L) 10/13/2022 0814   HCT 32.8 (L) 01/19/2020 0911   PLT 63 (L) 10/13/2022 0814   PLT 216 01/19/2020 0911   MCV 90.3 10/13/2022 0814   MCV 94 01/19/2020 0911   MCH 30.0 10/13/2022 0814   MCHC 33.2 10/13/2022 0814   RDW 13.6 10/13/2022 0814   RDW 12.5 01/19/2020 0911   LYMPHSABS 1.5 10/13/2022 0814   LYMPHSABS 0.8 01/19/2020 0911   MONOABS 0.5 10/13/2022 0814   EOSABS 0.1 10/13/2022 0814   EOSABS 0.2 01/19/2020 0911   BASOSABS 0.0 10/13/2022 0814   BASOSABS 0.0 01/19/2020 0911    CMP     Component Value Date/Time   NA 135 10/07/2022 1353   K 4.1 10/07/2022 1353   CL 100 10/07/2022 1353   CO2 25 10/07/2022 1353   GLUCOSE 135 (H) 10/07/2022 1353   BUN 12 10/07/2022 1353   CREATININE 0.73 10/07/2022 1353   CALCIUM  9.7 10/07/2022 1353   PROT 6.9 10/07/2022 1353   ALBUMIN 3.9 10/07/2022 1353   AST 72 (H) 10/07/2022 1353   ALT 32 10/07/2022 1353   ALKPHOS 59 10/07/2022 1353   BILITOT 0.4 10/07/2022 1353   GFRNONAA >60 10/07/2022 1353   GFRAA >60 03/08/2020 1001        ASSESSMENT and THERAPY PLAN:   Malignant neoplasm of lower-inner quadrant of right breast of female, estrogen receptor positive ([facility]) Dylanie is a 67 year old woman with newly discovered metastatic breast cancer here today to discuss her pathology results.    Unfortunately, today her pathology results are still pending.  Shelda Pal and Mickel Baas our breast nurses both called over to pathology who explained they were waiting on additional staining to confirm the breast origin.    Dr. Lindi Adie met with Hinton Dyer and explained to her the delay.  He also recommended that she continue Letrozole and that we proceed with bone marrow biopsy and aspirate tomorrow morning.  Later in the day, he spoke with pathology who had completed the read on her pathology, that confirmed breast origin.  He sent in Verzenio 157m po bid and called DTereaseto review the results with her, the Verzenio (please see [facility] additional note from today).    DKiawill return tomorrow for bone marrow biopsy.  I reviewed this procedure with her in detail, the risks and benefits.  She understands and is agreeable to proceed.  I sent in Lorazepam 0.530mpo Q8HPRN (#15) for her anxiety and to take before the procedure if needed.    DaArieyannaill return on Friday, 10/16/2022 for f/u with Dr. GuLindi Adieo review the bone marrow biopsy results.      All questions were answered. The patient knows to call the clinic with any problems, questions or concerns. We can certainly see the patient much sooner if necessary.  Total encounter time:30 minutes*in face-to-face visit time, chart review, lab review, care coordination, order entry, and documentation of the encounter time.    LiWilber BihariNP 10/15/22 9:21 PM Medical Oncology and Hematology [facility] [address]. [phone]  Fax. [phone]*Total Encounter Time as defined by the Centers for Medicare and Medicaid Services includes, in addition to  the face-to-face time of a patient visit (documented in the note above) non-face-to-face time: obtaining and reviewing outside history, ordering and reviewing medications, tests or procedures, care coordination (communications with other health care professionals or caregivers) and documentation in the medical record.

## 2022-10-13 NOTE — Telephone Encounter (Signed)
Oral Oncology Patient Advocate Encounter  After completing a benefits investigation, prior authorization for Verzenio is not required at this time through [facility].  Patient's copay is $46.66.     [name], CPhT-Adv Oncology Pharmacy Patient [place] Direct Number: [phone]  Fax: [phone]

## 2022-10-13 NOTE — Patient Instructions (Signed)
Bone Marrow Aspiration and Bone Marrow Biopsy, Adult Bone marrow aspiration and bone marrow biopsy are procedures that are done to diagnose blood disorders. These procedures may also be done to help diagnose cancer or certain infections. Bone marrow is the soft tissue that is inside the bones. Blood cells are produced in bone marrow. For a bone marrow aspiration, a sample of tissue in liquid form is removed from inside the bone. For a bone marrow biopsy, a small sample of solid bone marrow tissue is removed. You may need these procedures if you get an abnormal result on a blood test called a complete blood count (CBC). The aspiration or biopsy sample is usually taken from the upper part of the hip bone. Sometimes, an aspiration sample is taken from the chest bone (sternum). These samples are examined under a microscope or tested in a lab. Tell a health care provider about: Any allergies you have. All medicines you are taking, including vitamins, herbs, eye drops, creams, and over-the-counter medicines. Any problems you or family members have had with anesthetic medicines. Any bleeding problems or bone disorders you have. Any surgeries you have had. Any medical conditions you have. Any recent infections you have had, including skin infections. Whether you are pregnant or may be pregnant. What are the risks? Your health care provider will talk with you about risks. These may include: Bleeding. Infection. Persistent pain after the procedure. Cracking (fracture) of the bone. Allergic reactions to medicines. Damage to nearby organs, if the sample is taken from the sternum. What happens before the procedure? When to stop eating and drinking Follow instructions from your health care provider about what you may eat and drink. These may include: 8 hours before your procedure Stop eating most foods. Do not eat meat, fried foods, or fatty foods. Eat only light foods, such as toast or crackers. All  liquids are okay except energy drinks and alcohol. 6 hours before your procedure Stop eating. Drink only clear liquids, such as water, clear fruit juice, black coffee, plain tea, and sports drinks. Do not drink energy drinks or alcohol. 2 hours before your procedure Stop drinking all liquids. You may be allowed to take medicines with small sips of water.  Medicines Ask your health care provider about: Changing or stopping your regular medicines. These include any diabetes medicines or blood thinners you take. Taking medicines such as aspirin and ibuprofen. These medicines can thin your blood. Do not take them unless your health care provider tells you to. Taking over-the-counter medicines, vitamins, herbs, and supplements. General instructions If you will be going home right after the procedure, plan to have a responsible adult: Take you home from the hospital or clinic. You will not be allowed to drive. Care for you for the time you are told. Ask your health care provider: How your surgery site will be marked. What steps will be taken to help prevent infection. These may include: Removing hair at the surgery site. Washing skin with a soap that kills germs. What happens during the procedure?  An IV may be inserted into one of your veins. You may be given: A sedative. This helps you relax. Anesthesia. This will: Numb certain areas of your body. Make you fall asleep for surgery. You will be positioned on your stomach or back, based on the site of the procedure. The skin over the site will be cleaned with an antiseptic solution. The bone marrow sample will be removed as follows: A small incision will be made   into your skin. For an aspiration, a hollow needle will be inserted through your skin and into your bone. Bone marrow fluid will be drawn up into the needle. You may feel a sharp, stinging sensation when the fluid is removed. For a biopsy, a hollow needle will be used to remove a  small sample of solid tissue from your bone marrow. The needle will be removed and pressure will be applied to the site. The incision will be closed with stitches (sutures), skin glue, or adhesive strips. A bandage (dressing) will be placed over the incision site and taped in place. Bone marrow fluid or tissue will be sent to a lab for examination. The procedure may vary among health care providers and hospitals. What happens after the procedure? Your blood pressure, heart rate, breathing rate, and blood oxygen level will be monitored until you leave the hospital or clinic. Your IV will be removed, and the incision site will be checked for bleeding. It is up to you to get the results of your procedure. Ask your health care provider, or the department that is doing the procedure, when your results will be ready. This information is not intended to replace advice given to you by your health care provider. Make sure you discuss any questions you have with your health care provider. Document Revised: 12/22/2021 Document Reviewed: 12/22/2021 Elsevier Patient Education  2023 Elsevier Inc.  

## 2022-10-13 NOTE — Progress Notes (Unsigned)
Pt monitored for 30 minutes post BMBX. VSS. Biopsy site dressing saturated with 1 inch of blood upon assessment. Dressing changed. No active bleeding noted at biopsy site at time of discharge. Pt instructed to contact MD if bleeding re-started. No complaints at time of discharge.

## 2022-10-13 NOTE — Telephone Encounter (Signed)
Oral Oncology Pharmacist Encounter  Received new prescription for abemaciclib (Verzenio) for the treatment of metastatic ER positive, HER2 negative breast cancer in conjunction with letrozole, planned duration until disease progression or unacceptable toxicity.  Labs from 10/13/21 assessed, labs are in process. Prescription dose and frequency assessed for appropriateness.   Current medication list in Epic reviewed, no significant DDIs with Verzenio identified.  Evaluated chart and no patient barriers to medication adherence noted.   Patient agreement for treatment documented in MD note on 10/12/2022.  Prescription has been e-scribed to the [facility] for benefits analysis and approval.  Oral Oncology Clinic will continue to follow for insurance authorization, copayment issues, initial counseling and start date.  Drema Halon, PharmD Hematology/Oncology Clinical Pharmacist [facility] [phone] 10/13/2022 8:15 AM

## 2022-10-13 NOTE — Progress Notes (Unsigned)
INDICATION: cytopenias   Bone Marrow Biopsy and Aspiration Procedure Note   Informed consent was obtained and potential risks including bleeding, infection and pain were reviewed with the patient.  The patient's name, date of birth, identification, consent and allergies were verified prior to the start of procedure and time out was performed.  The left posterior iliac crest was chosen as the site of biopsy.  The skin was prepped with ChlorPrep.   10 cc of 1% lidocaine was used to provide local anaesthesia.   5 cc of bone marrow aspirate was obtained followed by 1cm biopsy.  Pressure was applied to the biopsy site and bandage was placed over the biopsy site. Patient was made to lie on the back for 15 mins prior to discharge.  The procedure was tolerated well. COMPLICATIONS: None BLOOD LOSS: none The patient was discharged home in stable condition with a 1 week follow up to review results.  Patient was provided with post bone marrow biopsy instructions and instructed to call if there was any bleeding or worsening pain.  Specimens sent for flow cytometry, cytogenetics and additional studies.  Signed Harriette Ohara, MD

## 2022-10-13 NOTE — Assessment & Plan Note (Signed)
Theresa [name] is a 67 year old woman with newly discovered metastatic breast cancer here today to discuss her pathology results.    Unfortunately, today her pathology results are still pending.  Theresa [name] and Theresa [name] our breast nurses both called over to pathology who explained they were waiting on additional staining to confirm the breast origin.    Dr. Lindi [name] met with Theresa [name] and explained to her the delay.  He also recommended that she continue Letrozole and that we proceed with bone marrow biopsy and aspirate tomorrow morning.  Later in the day, he spoke with pathology who had completed the read on her pathology, that confirmed breast origin.  He sent in Theresa [name] 132m po bid and called Theresa [name] review the results with her, the Theresa [name] (please see [facility] additional note from today).    [name] return tomorrow for bone marrow biopsy.  I reviewed this procedure with her in detail, the risks and benefits.  She understands and is agreeable to proceed.  I sent in Theresa [name] 0.519mpo [id number] (#15) for her anxiety and to take before the procedure if needed.    Theresa [name] return on Friday, 10/16/2022 for f/u with Theresa [name] review the bone marrow biopsy results.

## 2022-10-15 ENCOUNTER — Ambulatory Visit: Payer: Medicare HMO

## 2022-10-15 LAB — SURGICAL PATHOLOGY

## 2022-10-15 NOTE — Progress Notes (Incomplete)
Patient Care Team: [name], MD as PCP - General (Internal Medicine) Stark Klein, MD as Consulting Physician (General Surgery) Eppie Gibson, MD as Attending Physician (Radiation Oncology) Nunzio Cobbs, MD as Consulting Physician (Obstetrics and Gynecology) Rolm Bookbinder, MD as Consulting Physician (Dermatology) Ardis Hughs, MD as Consulting Physician (Urology) Juanita Craver, MD as Consulting Physician (Gastroenterology) Renette Butters, MD as Attending Physician (Orthopedic Surgery) Nicholas Lose, MD as Consulting Physician (Hematology and Oncology)  DIAGNOSIS: No diagnosis found.  SUMMARY OF ONCOLOGIC HISTORY: Oncology History  Malignant neoplasm of lower-inner quadrant of right breast of female, estrogen receptor positive ([place])  07/03/2019 Initial Diagnosis   right lower inner quadrant biopsy, for a clinicaly multiple T1c N0, stage IA invasive ductal carcinoma, grade 2, E-cadherin positive, strongly estrogen receptor positive but progesterone receptor negative and HER-2 not amplified, with an MIB-1 of 5%             (a) breast MRI 08/07/2019 showed 2 additional suspicious areas in the right breast and 1 in the left breast              (b) biopsy of all 3 areas 08/21/2019 showed no malignancy   07/26/2019 -  Anti-estrogen oral therapy   tamoxifen started neoadjuvantly 07/26/2019; to be continued for 10 years.  S/p TAH/BSO on 01/16/2020 with benign pathology   08/04/2019 Genetic Testing   Negative genetic testing:  No pathogenic variants detected on the [facility] Breast Cancer STAT panel or the Common Hereditary Cancers panel. The report date is 08/04/2019.  The STAT Breast cancer panel offered by [facility] includes sequencing and rearrangement analysis for the following 9 genes:  ATM, BRCA1, BRCA2, CDH1, CHEK2, PALB2, PTEN, STK11 and TP53.  The Common Hereditary Cancers Panel offered by [facility] includes sequencing and/or deletion duplication testing of the following  48 genes: APC, ATM, AXIN2, BARD1, BMPR1A, BRCA1, BRCA2, BRIP1, CDH1, CDK4, CDKN2A (p14ARF), CDKN2A (p16INK4a), CHEK2, CTNNA1, DICER1, EPCAM (Deletion/duplication testing only), GREM1 (promoter region deletion/duplication testing only), KIT, MEN1, MLH1, MSH2, MSH3, MSH6, MUTYH, NBN, NF1, NHTL1, PALB2, PDGFRA, PMS2, POLD1, POLE, PTEN, RAD50, RAD51C, RAD51D, RNF43, SDHB, SDHC, SDHD, SMAD4, SMARCA4. STK11, TP53, TSC1, TSC2, and VHL.  The following genes were evaluated for sequence changes only: SDHA and HOXB13 c.251G>A variant only.    10/04/2019 Cancer Staging   Staging form: Breast, AJCC 8th Edition - Pathologic stage from 10/04/2019: Stage IIB (pT2, pN1a(sn), cM0, G2, ER+, PR-, HER2-) - Signed by Gardenia Phlegm, NP on 10/18/2019   10/04/2019 Surgery    right lumpectomy and sentinel lymph node sampling 10/04/2019 showed a pT2 pN1, stage IIB invasive ductal carcinoma, grade 2, with positive lymphovascular invasion and a positive superior margin             (a) 3 sentinel lymph nodes removed, one with macro metastatic deposit, 1 with a micrometastatic deposit             (b) additional surgery for margin clearance   10/04/2019 Miscellaneous    MammaPrint on the 10/04/2019 sample shows a low risk luminal A tumor predicting a 5-year metastasis free survival of 96% without chemotherapy, and a chemotherapy benefit of less than 1.5%    11/27/2019 - 01/05/2020 Radiation Therapy    Site Technique Total Dose (Gy) Dose per Fx (Gy) Completed Fx Beam Energies  Breast, Right: Breast_Rt 3D 50/50 2 25/25 6X, 10X  Breast, Right: Breast_Rt_SCV_PAB 3D 50/50 2 25/25 6X, 10X  Breast, Right: Breast_Rt_Bst 3D 10/10 2 5/5 6X, 10X  09/29/2022 Imaging   IMPRESSION: 1. Surgical changes involving the right medial breast. No obvious recurrent breast mass or chest wall mass. 2. Mediastinal and hilar lymphadenopathy suggesting metastatic disease. 3. Patchy tree-in-bud type nodularity in the lungs suggesting chronic  inflammation or atypical infection such as MAC. No definite pulmonary metastatic nodules. 4. Large partially necrotic mass involving most of the right hepatic lobe and a 2.5 cm lesion in segment 3 of the liver consistent with metastatic disease. 5. 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   10/06/2022 Initial Biopsy   Liver biopsy: + for malignancy, metastatic poorly differentiated carcinoma, likely breast origin, biomarkers pending, insufficient tissue for molecular testing. (Resulted in EPIC on 10/13/2022)   10/07/2022 PET scan   PET scan on 10/07/2022 that showed hypermetabolic metastatic breast cancer with hypermetabolic mediastinal and hilar adenopathy, hypermetabolic hepatic metastatic disease and bone disease.  There were no findings for pulmonary metastatic disease.     CHIEF COMPLIANT: Review BMBX  INTERVAL HISTORY: Theresa [name] is a 67 y.o female with the above-mentioned Estrogen receptor positive breast cancer. She presents to the clinic for a follow-up     ALLERGIES:  is allergic to metronidazole, other, and tape.  MEDICATIONS:  Current Outpatient Medications  Medication Sig Dispense Refill   abemaciclib (VERZENIO) 100 MG tablet Take 1 tablet (100 mg total) by mouth 2 (two) times daily. 60 tablet 1   gabapentin (NEURONTIN) 300 MG capsule Take 1 capsule (300 mg total) by mouth at bedtime. 90 capsule 4   hydrocortisone 2.5 % cream Apply topically 3 (three) times daily as needed. (Patient not taking: Reported on 10/12/2022) 28 g 3   letrozole (FEMARA) 2.5 MG tablet Take 1 tablet (2.5 mg total) by mouth daily. 30 tablet 0   loperamide (IMODIUM) 2 MG capsule Take 2 mg by mouth in the morning and at bedtime.      LORazepam (ATIVAN) 0.5 MG tablet Take 1 tablet (0.5 mg total) by mouth every 8 (eight) hours as needed for anxiety. 15 tablet 0   triamcinolone (KENALOG) 0.025 % ointment Apply 1 Application topically 2 (two) times daily. Use for one  week at a time. (Patient not taking: Reported on 10/12/2022) 30 g 1   valsartan (DIOVAN) 40 MG tablet Take by mouth.     venlafaxine XR (EFFEXOR-XR) 37.5 MG 24 hr capsule Take 1 capsule (37.5 mg total) by mouth daily. 90 capsule 4   No current facility-administered medications for this visit.    PHYSICAL EXAMINATION: ECOG PERFORMANCE STATUS: {CHL ONC ECOG PS:[id number]}  There were no vitals filed for this visit. There were no vitals filed for this visit.  BREAST:*** No palpable masses or nodules in either right or left breasts. No palpable axillary supraclavicular or infraclavicular adenopathy no breast tenderness or nipple discharge. (exam performed in the presence of a chaperone)  LABORATORY DATA:  I have reviewed the data as listed    Latest Ref Rng & Units 10/13/2022    8:15 AM 10/07/2022    1:53 PM 10/06/2022    7:35 AM  CMP  Glucose 70 - 99 mg/dL 123  135  110   BUN 8 - 23 mg/dL 13  12  15   $ Creatinine 0.44 - 1.00 mg/dL 0.71  0.73  0.68   Sodium 135 - 145 mmol/L 135  135  136   Potassium 3.5 - 5.1 mmol/L 4.7  4.1  4.3   Chloride 98 - 111 mmol/L 102  100  105   CO2 22 - 32 mmol/L 24  25  22   $ Calcium 8.9 - 10.3 mg/dL 10.2  9.7  9.5   Total Protein 6.5 - 8.1 g/dL 6.3  6.9  6.7   Total Bilirubin 0.3 - 1.2 mg/dL 0.4  0.4  0.6   Alkaline Phos 38 - 126 U/L 68  59  59   AST 15 - 41 U/L 66  72  75   ALT 0 - 44 U/L 31  32  34     Lab Results  Component Value Date   WBC 7.1 10/13/2022   HGB 10.2 (L) 10/13/2022   HCT 30.7 (L) 10/13/2022   MCV 90.3 10/13/2022   PLT 63 (L) 10/13/2022   NEUTROABS 4.9 10/13/2022    ASSESSMENT & PLAN:  No problem-specific Assessment & Plan notes found for this encounter.    No orders of the defined types were placed in this encounter.  The patient has a good understanding of the overall plan. she agrees with it. she will call with any problems that may develop before the next visit here. Total time spent: 30 mins including face to face time  and time spent for planning, charting and co-ordination of care   Suzzette Righter, [place] 10/15/22    I Gardiner Coins am acting as a Education administrator for [employer]  ***

## 2022-10-16 ENCOUNTER — Inpatient Hospital Stay: Payer: Medicare HMO | Admitting: Hematology and Oncology

## 2022-10-16 LAB — SURGICAL PATHOLOGY

## 2022-10-16 NOTE — Progress Notes (Signed)
Faxed tumor requisiton order to [facility] at [phone] with receipt confirmation. Added note to use bone marrow bx from 10/13/22 per Nicholas Lose, MD.

## 2022-10-19 ENCOUNTER — Other Ambulatory Visit: Payer: Self-pay

## 2022-10-19 ENCOUNTER — Other Ambulatory Visit (HOSPITAL_COMMUNITY): Payer: Self-pay

## 2022-10-19 NOTE — Telephone Encounter (Signed)
Oral Chemotherapy Pharmacist Encounter  I spoke with patient for overview of: Verzenio for the treatment of metastatic, hormone-receptor positive breast cancer, in combination with letrozole, planned duration until disease progression or unacceptable toxicity.   Counseled patient on administration, dosing, side effects, monitoring, drug-food interactions, safe handling, storage, and disposal.  Patient will take Verzenio 154m tablets, 1 tablet by mouth twice daily without regard to food.  Patient knows to avoid grapefruit and grapefruit juice.  Verzenio start date: ***  Adverse effects include but are not limited to: diarrhea, fatigue, nausea, abdominal pain, decreased blood counts, and increased liver function tests, and joint pains. Severe, life-threatening, and/or fatal interstitial lung disease (ILD) and/or pneumonitis may occur with CDK 4/6 inhibitors.  Patient has anti-emetic on hand and knows to take it if nausea develops.   Patient will obtain anti diarrheal and alert the office of 4 or more loose stools above baseline.  Reviewed with patient importance of keeping a medication schedule and plan for any missed doses. No barriers to medication adherence identified.  Medication reconciliation performed and medication/allergy list updated.  Insurance authorization for [employer] been obtained. Test claim at the pharmacy revealed copayment $46.66 for 1st fill of 28 days. [facility] to [place] to patient's home on ***.  Patient informed the pharmacy will reach out 5-7 days prior to needing next fill of Verzenio to coordinate continued medication acquisition to prevent break in therapy.  All questions answered.  Patient voiced understanding and appreciation.   Medication education handout placed in mail for patient. Patient knows to call the office with questions or concerns. Oral Chemotherapy Clinic phone number provided to  patient.   KDrema Halon PharmD Hematology/Oncology Clinical Pharmacist [id number]/19/2024   1:46 PM

## 2022-10-19 NOTE — Progress Notes (Signed)
Patient Care Team: [name], MD as PCP - General (Internal Medicine) Stark Klein, MD as Consulting Physician (General Surgery) Eppie Gibson, MD as Attending Physician (Radiation Oncology) Nunzio Cobbs, MD as Consulting Physician (Obstetrics and Gynecology) Rolm Bookbinder, MD as Consulting Physician (Dermatology) Ardis Hughs, MD as Consulting Physician (Urology) Juanita Craver, MD as Consulting Physician (Gastroenterology) Renette Butters, MD as Attending Physician (Orthopedic Surgery) Nicholas Lose, MD as Consulting Physician (Hematology and Oncology)  DIAGNOSIS: No diagnosis found.  SUMMARY OF ONCOLOGIC HISTORY: Oncology History  Malignant neoplasm of lower-inner quadrant of right breast of female, estrogen receptor positive ([place])  07/03/2019 Initial Diagnosis   right lower inner quadrant biopsy, for a clinicaly multiple T1c N0, stage IA invasive ductal carcinoma, grade 2, E-cadherin positive, strongly estrogen receptor positive but progesterone receptor negative and HER-2 not amplified, with an MIB-1 of 5%             (a) breast MRI 08/07/2019 showed 2 additional suspicious areas in the right breast and 1 in the left breast              (b) biopsy of all 3 areas 08/21/2019 showed no malignancy   07/26/2019 -  Anti-estrogen oral therapy   tamoxifen started neoadjuvantly 07/26/2019; to be continued for 10 years.  S/p TAH/BSO on 01/16/2020 with benign pathology   08/04/2019 Genetic Testing   Negative genetic testing:  No pathogenic variants detected on the [facility] Breast Cancer STAT panel or the Common Hereditary Cancers panel. The report date is 08/04/2019.  The STAT Breast cancer panel offered by [facility] includes sequencing and rearrangement analysis for the following 9 genes:  ATM, BRCA1, BRCA2, CDH1, CHEK2, PALB2, PTEN, STK11 and TP53.  The Common Hereditary Cancers Panel offered by [facility] includes sequencing and/or deletion duplication testing of the following  48 genes: APC, ATM, AXIN2, BARD1, BMPR1A, BRCA1, BRCA2, BRIP1, CDH1, CDK4, CDKN2A (p14ARF), CDKN2A (p16INK4a), CHEK2, CTNNA1, DICER1, EPCAM (Deletion/duplication testing only), GREM1 (promoter region deletion/duplication testing only), KIT, MEN1, MLH1, MSH2, MSH3, MSH6, MUTYH, NBN, NF1, NHTL1, PALB2, PDGFRA, PMS2, POLD1, POLE, PTEN, RAD50, RAD51C, RAD51D, RNF43, SDHB, SDHC, SDHD, SMAD4, SMARCA4. STK11, TP53, TSC1, TSC2, and VHL.  The following genes were evaluated for sequence changes only: SDHA and HOXB13 c.251G>A variant only.    10/04/2019 Cancer Staging   Staging form: Breast, AJCC 8th Edition - Pathologic stage from 10/04/2019: Stage IIB (pT2, pN1a(sn), cM0, G2, ER+, PR-, HER2-) - Signed by Gardenia Phlegm, NP on 10/18/2019   10/04/2019 Surgery    right lumpectomy and sentinel lymph node sampling 10/04/2019 showed a pT2 pN1, stage IIB invasive ductal carcinoma, grade 2, with positive lymphovascular invasion and a positive superior margin             (a) 3 sentinel lymph nodes removed, one with macro metastatic deposit, 1 with a micrometastatic deposit             (b) additional surgery for margin clearance   10/04/2019 Miscellaneous    MammaPrint on the 10/04/2019 sample shows a low risk luminal A tumor predicting a 5-year metastasis free survival of 96% without chemotherapy, and a chemotherapy benefit of less than 1.5%    11/27/2019 - 01/05/2020 Radiation Therapy    Site Technique Total Dose (Gy) Dose per Fx (Gy) Completed Fx Beam Energies  Breast, Right: Breast_Rt 3D 50/50 2 25/25 6X, 10X  Breast, Right: Breast_Rt_SCV_PAB 3D 50/50 2 25/25 6X, 10X  Breast, Right: Breast_Rt_Bst 3D 10/10 2 5/5 6X, 10X  09/29/2022 Imaging   IMPRESSION: 1. Surgical changes involving the right medial breast. No obvious recurrent breast mass or chest wall mass. 2. Mediastinal and hilar lymphadenopathy suggesting metastatic disease. 3. Patchy tree-in-bud type nodularity in the lungs suggesting chronic  inflammation or atypical infection such as MAC. No definite pulmonary metastatic nodules. 4. Large partially necrotic mass involving most of the right hepatic lobe and a 2.5 cm lesion in segment 3 of the liver consistent with metastatic disease. 5. 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   10/06/2022 Initial Biopsy   Liver biopsy: + for malignancy, metastatic poorly differentiated carcinoma, likely breast origin, biomarkers pending, insufficient tissue for molecular testing. (Resulted in EPIC on 10/13/2022)   10/07/2022 PET scan   PET scan on 10/07/2022 that showed hypermetabolic metastatic breast cancer with hypermetabolic mediastinal and hilar adenopathy, hypermetabolic hepatic metastatic disease and bone disease.  There were no findings for pulmonary metastatic disease.     CHIEF COMPLIANT:   INTERVAL HISTORY: Theresa [name] is a   ALLERGIES:  is allergic to metronidazole, other, and tape.  MEDICATIONS:  Current Outpatient Medications  Medication Sig Dispense Refill   abemaciclib (VERZENIO) 100 MG tablet Take 1 tablet (100 mg total) by mouth 2 (two) times daily. 60 tablet 1   gabapentin (NEURONTIN) 300 MG capsule Take 1 capsule (300 mg total) by mouth at bedtime. 90 capsule 4   hydrocortisone 2.5 % cream Apply topically 3 (three) times daily as needed. (Patient not taking: Reported on 10/12/2022) 28 g 3   letrozole (FEMARA) 2.5 MG tablet Take 1 tablet (2.5 mg total) by mouth daily. 30 tablet 0   loperamide (IMODIUM) 2 MG capsule Take 2 mg by mouth in the morning and at bedtime.      LORazepam (ATIVAN) 0.5 MG tablet Take 1 tablet (0.5 mg total) by mouth every 8 (eight) hours as needed for anxiety. 15 tablet 0   triamcinolone (KENALOG) 0.025 % ointment Apply 1 Application topically 2 (two) times daily. Use for one week at a time. (Patient not taking: Reported on 10/12/2022) 30 g 1   valsartan (DIOVAN) 40 MG tablet Take by mouth.     venlafaxine XR  (EFFEXOR-XR) 37.5 MG 24 hr capsule Take 1 capsule (37.5 mg total) by mouth daily. 90 capsule 4   No current facility-administered medications for this visit.    PHYSICAL EXAMINATION: ECOG PERFORMANCE STATUS: {CHL ONC ECOG PS:[id number]}  There were no vitals filed for this visit. There were no vitals filed for this visit.  BREAST:*** No palpable masses or nodules in either right or left breasts. No palpable axillary supraclavicular or infraclavicular adenopathy no breast tenderness or nipple discharge. (exam performed in the presence of a chaperone)  LABORATORY DATA:  I have reviewed the data as listed    Latest Ref Rng & Units 10/13/2022    8:15 AM 10/07/2022    1:53 PM 10/06/2022    7:35 AM  CMP  Glucose 70 - 99 mg/dL 123  135  110   BUN 8 - 23 mg/dL 13  12  15   $ Creatinine 0.44 - 1.00 mg/dL 0.71  0.73  0.68   Sodium 135 - 145 mmol/L 135  135  136   Potassium 3.5 - 5.1 mmol/L 4.7  4.1  4.3   Chloride 98 - 111 mmol/L 102  100  105   CO2 22 - 32 mmol/L 24  25  22   $ Calcium 8.9 - 10.3 mg/dL 10.2  9.7  9.5   Total Protein 6.5 - 8.1 g/dL 6.3  6.9  6.7   Total Bilirubin 0.3 - 1.2 mg/dL 0.4  0.4  0.6   Alkaline Phos 38 - 126 U/L 68  59  59   AST 15 - 41 U/L 66  72  75   ALT 0 - 44 U/L 31  32  34     Lab Results  Component Value Date   WBC 7.1 10/13/2022   HGB 10.2 (L) 10/13/2022   HCT 30.7 (L) 10/13/2022   MCV 90.3 10/13/2022   PLT 63 (L) 10/13/2022   NEUTROABS 4.9 10/13/2022    ASSESSMENT & PLAN:  No problem-specific Assessment & Plan notes found for this encounter.    No orders of the defined types were placed in this encounter.  The patient has a good understanding of the overall plan. she agrees with it. she will call with any problems that may develop before the next visit here. Total time spent: 30 mins including face to face time and time spent for planning, charting and co-ordination of care   Suzzette Righter, [place] 10/19/22    I Gardiner Coins am acting  as a Education administrator for [employer]  ***

## 2022-10-19 NOTE — Assessment & Plan Note (Signed)
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  Patchy tree-in-bud nodularity in the lungs, partially necrotic mass right hepatic lobe 12 cm, also 2.5 cm lesion lytic bone metastases in spine pelvis and left scapula   Recommendation: Liver biopsy: Poorly diff cancer ER 30-40%, PR:0%, Her 2 Neg Verzinio with AI 3.  PET CT scan [id number]: Hypermetabolic Met Breast cancer Mediastinal and Hilar LN and hepatic met disease and bone disease 4. Bone Marrow Biopsy: 10/13/22: Positive for met breast cancer: [facility] requested   Start Verzinio and RTC in 2 weeks

## 2022-10-20 ENCOUNTER — Other Ambulatory Visit: Payer: Self-pay

## 2022-10-20 ENCOUNTER — Ambulatory Visit: Payer: Medicare HMO | Admitting: Hematology and Oncology

## 2022-10-20 ENCOUNTER — Inpatient Hospital Stay (HOSPITAL_BASED_OUTPATIENT_CLINIC_OR_DEPARTMENT_OTHER): Payer: Medicare HMO | Admitting: Hematology and Oncology

## 2022-10-20 VITALS — BP 160/86 | HR 100 | Temp 97.9°F | Resp 19 | Wt 149.4 lb

## 2022-10-20 DIAGNOSIS — C50311 Malignant neoplasm of lower-inner quadrant of right female breast: Secondary | ICD-10-CM | POA: Diagnosis not present

## 2022-10-20 DIAGNOSIS — Z170 Estrogen receptor positive status [ER+]: Secondary | ICD-10-CM

## 2022-10-21 ENCOUNTER — Encounter (HOSPITAL_COMMUNITY): Payer: Self-pay | Admitting: Hematology and Oncology

## 2022-10-21 ENCOUNTER — Telehealth: Payer: Self-pay | Admitting: Hematology and Oncology

## 2022-10-21 NOTE — Telephone Encounter (Signed)
Scheduled appointment per 2/20 los. Patient is aware of the made appointments.

## 2022-10-23 ENCOUNTER — Other Ambulatory Visit: Payer: Self-pay | Admitting: Hematology and Oncology

## 2022-10-30 ENCOUNTER — Other Ambulatory Visit: Payer: Self-pay | Admitting: *Deleted

## 2022-10-30 DIAGNOSIS — C50311 Malignant neoplasm of lower-inner quadrant of right female breast: Secondary | ICD-10-CM

## 2022-11-03 ENCOUNTER — Inpatient Hospital Stay: Payer: Medicare HMO | Attending: Hematology and Oncology

## 2022-11-03 ENCOUNTER — Inpatient Hospital Stay: Payer: Medicare HMO | Admitting: Pharmacist

## 2022-11-03 ENCOUNTER — Other Ambulatory Visit: Payer: Self-pay

## 2022-11-03 VITALS — BP 133/74 | HR 87 | Temp 98.2°F | Resp 14 | Ht 65.35 in | Wt 147.3 lb

## 2022-11-03 DIAGNOSIS — Z170 Estrogen receptor positive status [ER+]: Secondary | ICD-10-CM | POA: Insufficient documentation

## 2022-11-03 DIAGNOSIS — Z79811 Long term (current) use of aromatase inhibitors: Secondary | ICD-10-CM | POA: Diagnosis not present

## 2022-11-03 DIAGNOSIS — C50311 Malignant neoplasm of lower-inner quadrant of right female breast: Secondary | ICD-10-CM

## 2022-11-03 DIAGNOSIS — C7951 Secondary malignant neoplasm of bone: Secondary | ICD-10-CM | POA: Insufficient documentation

## 2022-11-03 DIAGNOSIS — Z79899 Other long term (current) drug therapy: Secondary | ICD-10-CM | POA: Insufficient documentation

## 2022-11-03 DIAGNOSIS — C787 Secondary malignant neoplasm of liver and intrahepatic bile duct: Secondary | ICD-10-CM | POA: Insufficient documentation

## 2022-11-03 LAB — CBC WITH DIFFERENTIAL (CANCER CENTER ONLY)
Abs Immature Granulocytes: 0.02 10*3/uL (ref 0.00–0.07)
Basophils Absolute: 0 10*3/uL (ref 0.0–0.1)
Basophils Relative: 1 %
Eosinophils Absolute: 0.1 10*3/uL (ref 0.0–0.5)
Eosinophils Relative: 2 %
HCT: 33.1 % — ABNORMAL LOW (ref 36.0–46.0)
Hemoglobin: 11.2 g/dL — ABNORMAL LOW (ref 12.0–15.0)
Immature Granulocytes: 1 %
Lymphocytes Relative: 37 %
Lymphs Abs: 1.6 10*3/uL (ref 0.7–4.0)
MCH: 31 pg (ref 26.0–34.0)
MCHC: 33.8 g/dL (ref 30.0–36.0)
MCV: 91.7 fL (ref 80.0–100.0)
Monocytes Absolute: 0.3 10*3/uL (ref 0.1–1.0)
Monocytes Relative: 6 %
Neutro Abs: 2.3 10*3/uL (ref 1.7–7.7)
Neutrophils Relative %: 53 %
Platelet Count: 53 10*3/uL — ABNORMAL LOW (ref 150–400)
RBC: 3.61 MIL/uL — ABNORMAL LOW (ref 3.87–5.11)
RDW: 14.9 % (ref 11.5–15.5)
WBC Count: 4.3 10*3/uL (ref 4.0–10.5)
nRBC: 0 % (ref 0.0–0.2)

## 2022-11-03 LAB — CMP (CANCER CENTER ONLY)
ALT: 27 U/L (ref 0–44)
AST: 87 U/L — ABNORMAL HIGH (ref 15–41)
Albumin: 4.2 g/dL (ref 3.5–5.0)
Alkaline Phosphatase: 99 U/L (ref 38–126)
Anion gap: 8 (ref 5–15)
BUN: 15 mg/dL (ref 8–23)
CO2: 23 mmol/L (ref 22–32)
Calcium: 10.1 mg/dL (ref 8.9–10.3)
Chloride: 106 mmol/L (ref 98–111)
Creatinine: 1.16 mg/dL — ABNORMAL HIGH (ref 0.44–1.00)
GFR, Estimated: 52 mL/min — ABNORMAL LOW (ref >60)
Glucose, Bld: 107 mg/dL — ABNORMAL HIGH (ref 70–99)
Potassium: 4.6 mmol/L (ref 3.5–5.1)
Sodium: 137 mmol/L (ref 135–145)
Total Bilirubin: 0.5 mg/dL (ref 0.3–1.2)
Total Protein: 6.7 g/dL (ref 6.5–8.1)

## 2022-11-03 MED ORDER — ONDANSETRON HCL 8 MG PO TABS: 8.0000 mg | ORAL_TABLET | Freq: Three times a day (TID) | ORAL | 2 refills | Status: DC | PRN

## 2022-11-03 MED ORDER — PROCHLORPERAZINE MALEATE 10 MG PO TABS: 10.0000 mg | ORAL_TABLET | Freq: Four times a day (QID) | ORAL | 2 refills | Status: DC | PRN

## 2022-11-03 NOTE — Progress Notes (Signed)
[place]       Telephone: [phone]?Fax: [phone]   Oncology Clinical Pharmacist Practitioner Initial Assessment  Theresa [name] is a 67 y.o. female with a diagnosis of breast cancer. They were contacted today via in-person visit.  Indication/Regimen Abemaciclib (Verzenio) is being used appropriately for treatment of metastatic breast cancer by Dr. Nicholas Lose.      Wt Readings from Last 1 Encounters:  11/03/22 147 lb 4.8 oz (66.8 kg)    Estimated body surface area is 1.76 meters squared as calculated from the following:   Height as of this encounter: 5' 5.35" (1.66 m).   Weight as of this encounter: 147 lb 4.8 oz (66.8 kg).  The dosing regimen is 100 mg by mouth every 12 hours on days 1 to 28 of a 28-day cycle. This is being given  in combination with letrozole . It is planned to continue until disease progression or unacceptable toxicity.  Clinical pharmacy met with Theresa [name] to establish care on her abemaciclib management after being referred by Dr. Lindi Adie.  She states that she started abemaciclib on 10/21/22 and overall is tolerating it well.  She has been using loperamide which has been helpful.  Per her request, we also sent as needed antinausea medications today which included ondansetron and prochlorperazine to her local pharmacy of choice.  We did also give her the dental clearance form if Dr. Lindi Adie does decide to start her on a bone strengthener in the future since she does have known bone metastases.  We reviewed with her that labs are usually done every 2 weeks for 2 months followed by monthly labs for 2 months and then as clinically indicated.  Dr. Lindi Adie will likely order restaging scans 3 months from her start date of abemaciclib which would be near the end of May.  He already has labs scheduled for March and clinical pharmacy will see her again in approximately 4 weeks.  Her platelets today were estimated at 53,000.  These will need to be closely  monitored for grade 3 toxicity.  Her AST was also elevated today and we will also need to monitor this for grade 3 toxicities.  Today we went over possible side effects of abemaciclib and management strategies which include but are not limited to diarrhea, neutropenia, interstitial lung disease, liver toxicity, blood clots, fatigue, alopecia, nausea, vomiting, and dysgeusia.  We also went over proper storage and handling techniques and to avoid grapefruit products.    Dose Modifications Dr. Lindi Adie is starting at abemaciclib 100 mg every 12 hours.  We will try to go up and dose 150 mg every 12 hours if tolerated.  Right now we will stay at the current dose due to platelet and LFT elevations  Access Assessment Theresa [name] will be receiving abemaciclib through [facility] Insurance Concerns: None Start date if known: 10/21/22  Allergies Allergies  Allergen Reactions   Metronidazole Other (See Comments)    C-Diff C-Diff C-Diff   Other Other (See Comments) and Rash    Dermabond - redness and burning, blistering Dermabond - redness and burning, blistering Dermabond - redness and burning, blistering "burning" skin   Tape Rash    "burning" skin "burning" skin    Vitals    11/03/2022   11:00 AM 10/20/2022   12:19 PM 10/13/2022    8:40 AM  Oncology Vitals  Height 166 cm    Weight 66.815 kg 67.756 kg   Weight (lbs) 147 lbs  5 oz 149 lbs 6 oz   BMI 24.25 kg/m2   24.25 kg/m2 24.86 kg/m2   24.86 kg/m2   Temp 98.2 F (36.8 C) 97.9 F (36.6 C) 97.7 F (36.5 C)  Pulse Rate 87 100 83  BP 133/74 160/86 122/70  Resp '14 19 18  '$ SpO2 96 % 97 % 95 %  BSA (m2) 1.76 m2   1.76 m2 1.76 m2   1.76 m2      Laboratory Data    Latest Ref Rng & Units 11/03/2022   10:32 AM 10/13/2022    8:14 AM 10/07/2022    1:53 PM  CBC EXTENDED  WBC 4.0 - 10.5 K/uL 4.3  7.1  9.7   RBC 3.87 - 5.11 MIL/uL 3.61  3.40  3.35   Hemoglobin 12.0 - 15.0 g/dL 11.2  10.2  10.2   HCT 36.0 - 46.0 % 33.1   30.7  30.6   Platelets 150 - 400 K/uL 53  63  61   NEUT# 1.7 - 7.7 K/uL 2.3  4.9  6.4   Lymph# 0.7 - 4.0 K/uL 1.6  1.5  2.5        Latest Ref Rng & Units 11/03/2022   10:32 AM 10/13/2022    8:15 AM 10/07/2022    1:53 PM  CMP  Glucose 70 - 99 mg/dL 107  123  135   BUN 8 - 23 mg/dL '15  13  12   '$ Creatinine 0.44 - 1.00 mg/dL 1.16  0.71  0.73   Sodium 135 - 145 mmol/L 137  135  135   Potassium 3.5 - 5.1 mmol/L 4.6  4.7  4.1   Chloride 98 - 111 mmol/L 106  102  100   CO2 22 - 32 mmol/L '23  24  25   '$ Calcium 8.9 - 10.3 mg/dL 10.1  10.2  9.7   Total Protein 6.5 - 8.1 g/dL 6.7  6.3  6.9   Total Bilirubin 0.3 - 1.2 mg/dL 0.5  0.4  0.4   Alkaline Phos 38 - 126 U/L 99  68  59   AST 15 - 41 U/L 87  66  72   ALT 0 - 44 U/L 27  31  32    No results found for: "MG" No results found for: "CA2729"  Contraindications Contraindications were reviewed? Yes Contraindications to therapy were identified? No   Safety Precautions The following safety precautions for the use of abemaciclib were reviewed:  Diarrhea: we reviewed that diarrhea is common with abemaciclib and confirmed that she does have loperamide (Imodium) at home.  We reviewed how to take this medication PRN and gave her information on abemaciclib Neutropenia: we discussed the importance of having a thermometer and what the Centers for Disease Control and Prevention (CDC) considers a fever which is 100.63F (38C) or higher.  Gave patient 24/7 triage line to call if any fevers or symptoms ILD/Pneumonitis: we reviewed potential symptoms including cough, shortness, and fatigue. Hepatotoxicity: reviewed to contact clinic for RUQ pain that will not subside, yellowing of eyes/skin Venous thromboembolism (VTE): reviewed signs of deep vein thrombosis (DVT) such as leg swelling, redness, pain, or tenderness and signs of pulmonary embolism (PE) such as shortness of breath, rapid or irregular heartbeat, cough, chest pain, or lightheadedness Reviewed to  take the medication every 12 hours (with food sometimes can be easier on the stomach) and to take it at the same time every day. Discussed proper storage and handling of abemaciclib  Medication Reconciliation  Current Outpatient Medications  Medication Sig Dispense Refill   abemaciclib (VERZENIO) 100 MG tablet Take 1 tablet (100 mg total) by mouth 2 (two) times daily. 60 tablet 1   gabapentin (NEURONTIN) 300 MG capsule Take 1 capsule (300 mg total) by mouth at bedtime. 90 capsule 4   letrozole (FEMARA) 2.5 MG tablet TAKE 1 TABLET BY MOUTH EVERY DAY 90 tablet 1   loperamide (IMODIUM) 2 MG capsule Take 2 mg by mouth in the morning and at bedtime.      ondansetron (ZOFRAN) 8 MG tablet Take 1 tablet (8 mg total) by mouth every 8 (eight) hours as needed for nausea or vomiting. 30 tablet 2   prochlorperazine (COMPAZINE) 10 MG tablet Take 1 tablet (10 mg total) by mouth every 6 (six) hours as needed for nausea or vomiting. 30 tablet 2   valsartan (DIOVAN) 40 MG tablet Take by mouth.     venlafaxine XR (EFFEXOR-XR) 37.5 MG 24 hr capsule Take 1 capsule (37.5 mg total) by mouth daily. 90 capsule 4   hydrocortisone 2.5 % cream Apply topically 3 (three) times daily as needed. (Patient not taking: Reported on 11/03/2022) 28 g 3   LORazepam (ATIVAN) 0.5 MG tablet Take 1 tablet (0.5 mg total) by mouth every 8 (eight) hours as needed for anxiety. (Patient not taking: Reported on 11/03/2022) 15 tablet 0   triamcinolone (KENALOG) 0.025 % ointment Apply 1 Application topically 2 (two) times daily. Use for one week at a time. (Patient not taking: Reported on 11/03/2022) 30 g 1   No current facility-administered medications for this visit.   Medication reconciliation is based on the patient's most recent medication list in the electronic medical record (EMR) including herbal products and OTC medications.   The patient's medication list was reviewed today with the patient? Yes   Drug-drug interactions (DDIs) DDIs were  evaluated? Yes Significant DDIs identified? No   Drug-Food Interactions Drug-food interactions were evaluated? Yes Drug-food interactions identified?  Avoid grapefruit products  Follow-up Plan  Continue abemaciclib 100 mg by mouth every 12 hours.  Will continue on this dose for now Closely monitor LFTs, Scr and platelets.  Hold for grade 3 toxicities which would include platelets less than 50k and AST > 5 x baseline (72 U/L on 10/27/22) since baseline AST was abnormal. Continue letrozole 2.5 mg by mouth daily Dental clearance form given to [name] proactively should Dr. Lindi Adie decide to start a bone strengthener at a later time. Continue labs every 2 weeks for 2 months followed by monthly labs for 2 months per manufacturing guidelines.   Dr. Lindi Adie will see patient next on 11/18/22 and clinical pharmacy will see her again on 12/02/22. Will change Dr. Geralyn Flash visit from 12/29/22 to 12/16/22 Restaging scans likely in 3 months per Dr. Lindi Adie recommendations. Started abemaciclib on 10/21/22.  [name] participated in the discussion, expressed understanding, and voiced agreement with the above plan. All questions were answered to her satisfaction. The patient was advised to contact the clinic at [phone] with any questions or concerns prior to her return visit.   I spent 60 minutes assessing the patient.  Raina Mina, RPH-CPP, 11/03/2022 11:56 AM  **Disclaimer: This note was dictated with voice recognition software. Similar sounding words can inadvertently be transcribed and this note may contain transcription errors which may not have been corrected upon publication of note.**

## 2022-11-04 ENCOUNTER — Other Ambulatory Visit (HOSPITAL_COMMUNITY): Payer: Self-pay

## 2022-11-04 ENCOUNTER — Telehealth: Payer: Self-pay | Admitting: Pharmacist

## 2022-11-04 NOTE — Telephone Encounter (Signed)
Scheduled appointments per 3/5 los. Left voicemail.

## 2022-11-09 ENCOUNTER — Ambulatory Visit: Payer: Medicare HMO | Admitting: Pharmacist

## 2022-11-09 NOTE — Progress Notes (Signed)
[place]       Telephone: [phone]?Fax: [phone]   Oncology Clinical Pharmacist Practitioner Progress Note  Theresa [name] is a 67 y.o. female with a diagnosis of metastatic breast cancer currently on abemaciclib/letrozole under the care of Dr. Nicholas Lose.   I connected with Theresa [name] today by telephone and verified that I was speaking with the correct person using two patient identifiers.   Other persons participating in the visit and their role in the encounter: none   Patient's location: home  Provider's location: clinic  [name] contacted clinical pharmacy today inquiring about taking vitamin B12 and using turmeric tea. We explained that with herbal supplements it is difficult to know for certain if a supplement may interact with a chemotherapy regimen. We did discuss that no significant interactions are known with these agents and what she is currently on but to monitor for side effects.  She also stated that she had some nausea/stomach pain last night but took ondansetron and is feeling much better today. This was recently prescribed as needed at our last visit.   She will see Dr. Lindi Adie with labs on 11/18/22. We are closely monitoring her platelets, AST, and serum creatinine which were all abnormal at her last visit. Will need to hold for grade 3 toxicities.  Theresa [name] participated in the discussion, expressed understanding, and voiced agreement with the above plan. All questions were answered to her satisfaction. The patient was advised to contact the clinic at [phone] with any questions or concerns prior to her return visit.  Clinical pharmacy will continue to support Theresa [name] and Dr. Nicholas Lose as needed.  Raina Mina, RPH-CPP,  11/09/2022  3:18 PM   **Disclaimer: This note was dictated with voice recognition software. Similar sounding words can inadvertently be transcribed and this note may contain transcription errors  which may not have been corrected upon publication of note.**

## 2022-11-10 NOTE — Progress Notes (Signed)
Patient Care Team: [name], MD as PCP - General (Internal Medicine) Stark Klein, MD as Consulting Physician (General Surgery) Eppie Gibson, MD as Attending Physician (Radiation Oncology) Nunzio Cobbs, MD as Consulting Physician (Obstetrics and Gynecology) Rolm Bookbinder, MD as Consulting Physician (Dermatology) Ardis Hughs, MD as Consulting Physician (Urology) Juanita Craver, MD as Consulting Physician (Gastroenterology) Renette Butters, MD as Attending Physician (Orthopedic Surgery) Nicholas Lose, MD as Consulting Physician (Hematology and Oncology)  DIAGNOSIS:  Encounter Diagnosis  Name Primary?   Malignant neoplasm of lower-inner quadrant of right breast of female, estrogen receptor positive ([place]) Yes    SUMMARY OF ONCOLOGIC HISTORY: Oncology History  Malignant neoplasm of lower-inner quadrant of right breast of female, estrogen receptor positive ([place])  07/03/2019 Initial Diagnosis   right lower inner quadrant biopsy, for a clinicaly multiple T1c N0, stage IA invasive ductal carcinoma, grade 2, E-cadherin positive, strongly estrogen receptor positive but progesterone receptor negative and HER-2 not amplified, with an MIB-1 of 5%             (a) breast MRI 08/07/2019 showed 2 additional suspicious areas in the right breast and 1 in the left breast              (b) biopsy of all 3 areas 08/21/2019 showed no malignancy   07/26/2019 -  Anti-estrogen oral therapy   tamoxifen started neoadjuvantly 07/26/2019; to be continued for 10 years.  S/p TAH/BSO on 01/16/2020 with benign pathology   08/04/2019 Genetic Testing   Negative genetic testing:  No pathogenic variants detected on the [facility] Breast Cancer STAT panel or the Common Hereditary Cancers panel. The report date is 08/04/2019.  The STAT Breast cancer panel offered by [facility] includes sequencing and rearrangement analysis for the following 9 genes:  ATM, BRCA1, BRCA2, CDH1, CHEK2, PALB2, PTEN, STK11 and  TP53.  The Common Hereditary Cancers Panel offered by [facility] includes sequencing and/or deletion duplication testing of the following 48 genes: APC, ATM, AXIN2, BARD1, BMPR1A, BRCA1, BRCA2, BRIP1, CDH1, CDK4, CDKN2A (p14ARF), CDKN2A (p16INK4a), CHEK2, CTNNA1, DICER1, EPCAM (Deletion/duplication testing only), GREM1 (promoter region deletion/duplication testing only), KIT, MEN1, MLH1, MSH2, MSH3, MSH6, MUTYH, NBN, NF1, NHTL1, PALB2, PDGFRA, PMS2, POLD1, POLE, PTEN, RAD50, RAD51C, RAD51D, RNF43, SDHB, SDHC, SDHD, SMAD4, SMARCA4. STK11, TP53, TSC1, TSC2, and VHL.  The following genes were evaluated for sequence changes only: SDHA and HOXB13 c.251G>A variant only.    10/04/2019 Cancer Staging   Staging form: Breast, AJCC 8th Edition - Pathologic stage from 10/04/2019: Stage IIB (pT2, pN1a(sn), cM0, G2, ER+, PR-, HER2-) - Signed by Gardenia Phlegm, NP on 10/18/2019   10/04/2019 Surgery    right lumpectomy and sentinel lymph node sampling 10/04/2019 showed a pT2 pN1, stage IIB invasive ductal carcinoma, grade 2, with positive lymphovascular invasion and a positive superior margin             (a) 3 sentinel lymph nodes removed, one with macro metastatic deposit, 1 with a micrometastatic deposit             (b) additional surgery for margin clearance   10/04/2019 Miscellaneous    MammaPrint on the 10/04/2019 sample shows a low risk luminal A tumor predicting a 5-year metastasis free survival of 96% without chemotherapy, and a chemotherapy benefit of less than 1.5%    11/27/2019 - 01/05/2020 Radiation Therapy    Site Technique Total Dose (Gy) Dose per Fx (Gy) Completed Fx Beam Energies  Breast, Right: Breast_Rt 3D 50/50 2 25/25  6X, 10X  Breast, Right: Breast_Rt_SCV_PAB 3D 50/50 2 25/25 6X, 10X  Breast, Right: Breast_Rt_Bst 3D 10/10 2 5/5 6X, 10X    09/29/2022 Imaging   IMPRESSION: 1. Surgical changes involving the right medial breast. No obvious recurrent breast mass or chest wall mass. 2. Mediastinal  and hilar lymphadenopathy suggesting metastatic disease. 3. Patchy tree-in-bud type nodularity in the lungs suggesting chronic inflammation or atypical infection such as MAC. No definite pulmonary metastatic nodules. 4. Large partially necrotic mass involving most of the right hepatic lobe and a 2.5 cm lesion in segment 3 of the liver consistent with metastatic disease. 5. 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   10/06/2022 Initial Biopsy   Liver biopsy: + for malignancy, metastatic poorly differentiated carcinoma, likely breast origin, biomarkers pending, insufficient tissue for molecular testing. (Resulted in EPIC on 10/13/2022)   10/07/2022 PET scan   PET scan on 10/07/2022 that showed hypermetabolic metastatic breast cancer with hypermetabolic mediastinal and hilar adenopathy, hypermetabolic hepatic metastatic disease and bone disease.  There were no findings for pulmonary metastatic disease.     CHIEF COMPLIANT: Follow-up on Verzinio with letrozole  INTERVAL HISTORY: Theresa [name] is a  67 y.o female with the above-mentioned Estrogen receptor positive breast cancer. Currently on letrozole. She presents to the clinic for a follow-up.  She is tolerating Verzinio fairly well.  She does have occasional loose stools for which she takes Imodium.  She also experiences nausea for which she is on nausea medication.  She has intermittent aches and pains.    ALLERGIES:  is allergic to metronidazole, other, and tape.  MEDICATIONS:  Current Outpatient Medications  Medication Sig Dispense Refill   abemaciclib (VERZENIO) 100 MG tablet Take 1 tablet (100 mg total) by mouth 2 (two) times daily. 60 tablet 1   gabapentin (NEURONTIN) 300 MG capsule Take 1 capsule (300 mg total) by mouth at bedtime. 90 capsule 4   hydrocortisone 2.5 % cream Apply topically 3 (three) times daily as needed. (Patient not taking: Reported on 11/03/2022) 28 g 3   letrozole (FEMARA) 2.5  MG tablet TAKE 1 TABLET BY MOUTH EVERY DAY 90 tablet 1   loperamide (IMODIUM) 2 MG capsule Take 2 mg by mouth in the morning and at bedtime.      LORazepam (ATIVAN) 0.5 MG tablet Take 1 tablet (0.5 mg total) by mouth every 8 (eight) hours as needed for anxiety. (Patient not taking: Reported on 11/03/2022) 15 tablet 0   methocarbamol (ROBAXIN) 500 MG tablet Take 500 mg by mouth 2 (two) times daily as needed for muscle spasms.     ondansetron (ZOFRAN) 8 MG tablet Take 1 tablet (8 mg total) by mouth every 8 (eight) hours as needed for nausea or vomiting. 30 tablet 2   prochlorperazine (COMPAZINE) 10 MG tablet Take 1 tablet (10 mg total) by mouth every 6 (six) hours as needed for nausea or vomiting. 30 tablet 2   triamcinolone (KENALOG) 0.025 % ointment Apply 1 Application topically 2 (two) times daily. Use for one week at a time. (Patient not taking: Reported on 11/03/2022) 30 g 1   valsartan (DIOVAN) 40 MG tablet Take by mouth.     venlafaxine XR (EFFEXOR-XR) 37.5 MG 24 hr capsule Take 1 capsule (37.5 mg total) by mouth daily. 90 capsule 4   No current facility-administered medications for this visit.    PHYSICAL EXAMINATION: ECOG PERFORMANCE STATUS: 1 - Symptomatic but completely ambulatory  Vitals:   11/18/22 1039  BP: (!) 154/79  Pulse: 96  Resp: 18  Temp: 97.8 F (36.6 C)  SpO2: 100%   Filed Weights   11/18/22 1039  Weight: 150 lb 11.2 oz (68.4 kg)      LABORATORY DATA:  I have reviewed the data as listed    Latest Ref Rng & Units 11/03/2022   10:32 AM 10/13/2022    8:15 AM 10/07/2022    1:53 PM  CMP  Glucose 70 - 99 mg/dL 107  123  135   BUN 8 - 23 mg/dL 15  13  12    Creatinine 0.44 - 1.00 mg/dL 1.16  0.71  0.73   Sodium 135 - 145 mmol/L 137  135  135   Potassium 3.5 - 5.1 mmol/L 4.6  4.7  4.1   Chloride 98 - 111 mmol/L 106  102  100   CO2 22 - 32 mmol/L 23  24  25    Calcium 8.9 - 10.3 mg/dL 10.1  10.2  9.7   Total Protein 6.5 - 8.1 g/dL 6.7  6.3  6.9   Total Bilirubin 0.3 -  1.2 mg/dL 0.5  0.4  0.4   Alkaline Phos 38 - 126 U/L 99  68  59   AST 15 - 41 U/L 87  66  72   ALT 0 - 44 U/L 27  31  32     Lab Results  Component Value Date   WBC 3.6 (L) 11/18/2022   HGB 10.6 (L) 11/18/2022   HCT 31.4 (L) 11/18/2022   MCV 93.2 11/18/2022   PLT 60 (L) 11/18/2022   NEUTROABS 1.8 11/18/2022    ASSESSMENT & PLAN:  Malignant neoplasm of lower-inner quadrant of right breast of female, estrogen receptor positive ([place]) 07/03/2019: Right breast LIQ T1CN0 stage Ia grade 2 IDC ER 95% PR negative HER2 negative Ki-67 5% 10/04/2019: Right lumpectomy: T2N1 stage IIb grade 2 IDC positive lymphovascular invasion 3 lymph nodes removed 1 with macro N1 micrometastatic disease 10/04/2019: MammaPrint: Low risk 01/05/2020: Completed adjuvant radiation 08/04/2019: Genetics: Negative November 2020: Tamoxifen ------------------------------------------------------------------- 09/28/2022: Patient presented with platelets 65 INR 1.9 elevated LFTs and hypercalcemia  CT CAP 09/29/2022: Mediastinal and hilar lymphadenopathy suggestive of metastatic disease.  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  PET CT scan [id number]: Hypermetabolic Met Breast cancer Mediastinal and Hilar LN and hepatic met disease and bone disease 4. Bone Marrow Biopsy: 10/13/22: Positive for met breast cancer: [facility] requested  --------------------------------------------------------------------------------------------------------------------------- Verzinio toxicities: Mild intermittent diarrhea: Managing extremely well.  We will maintain her on 100 mg p.o. twice daily dosing. Mild nausea: Takes a nausea medication in the morning and that seems to be helping.  Her plan is to obtain the scans  at the end of May to assess response to treatment. As long as she is riding her horses she appears to be in good spirits. She is hoping that if she stays healthy then she would like to do musical horse riding program.    No orders of the defined types were placed in this encounter.  The patient has a good understanding of the overall plan. she agrees with it. she will call with any problems that may develop before the next visit here. Total time spent: 30 mins including face to face  time and time spent for planning, charting and co-ordination of care   Harriette Ohara, MD 11/18/22    I Gardiner Coins am acting as a Education administrator for Dr.Khori Underberg  I have reviewed the above documentation for accuracy and completeness, and I agree with the above.

## 2022-11-11 ENCOUNTER — Other Ambulatory Visit (HOSPITAL_COMMUNITY): Payer: Self-pay

## 2022-11-12 ENCOUNTER — Other Ambulatory Visit (HOSPITAL_COMMUNITY): Payer: Self-pay

## 2022-11-12 ENCOUNTER — Other Ambulatory Visit: Payer: Self-pay

## 2022-11-13 ENCOUNTER — Encounter: Payer: Self-pay | Admitting: Hematology and Oncology

## 2022-11-17 ENCOUNTER — Ambulatory Visit: Payer: Medicare HMO | Admitting: Pharmacist

## 2022-11-17 ENCOUNTER — Other Ambulatory Visit: Payer: Self-pay | Admitting: *Deleted

## 2022-11-17 ENCOUNTER — Encounter: Payer: Self-pay | Admitting: *Deleted

## 2022-11-17 DIAGNOSIS — C50311 Malignant neoplasm of lower-inner quadrant of right female breast: Secondary | ICD-10-CM

## 2022-11-17 NOTE — Progress Notes (Signed)
[place]       Telephone: [phone]?Fax: [phone]   Oncology Clinical Pharmacist Practitioner Progress Note  Theresa [name] is a 67 y.o. female with a diagnosis of metastatic breast cancer currently on abemaciclib + letrozole under the care of Dr. Nicholas Lose.   I connected with Theresa [name] today by telephone and verified that I was speaking with the correct person using two patient identifiers.   Other persons participating in the visit and their role in the encounter: none   Patient's location: home  Provider's location: clinic  Theresa [name] contacted clinical pharmacy today inquiring if a muscle relaxer, methocarbamol (Robaxin), was okay to take with her current chemotherapy regimen. We reviewed drug-drug interactions and no significant interactions were identified. She was riding her horse yesterday and was sore afterwards. She feels the spasms and discomfort are very similar to other times she has injured her back. We did discuss that she will be seeing Dr. Lindi Adie tomorrow with labs and clinical pharmacy again with labs on 12/02/22. She knows to contact Dr. Geralyn Flash clinic should the pain get any worse before tomorrow's appointment. She stated the methocarbamol did alleviate the discomfort yesterday. We added this medication to her medication list.  JEREMI HAUGAN participated in the discussion, expressed understanding, and voiced agreement with the above plan. All questions were answered to her satisfaction. The patient was advised to contact the clinic at [phone] with any questions or concerns prior to her return visit.  Clinical pharmacy will continue to support Theresa [name] and Dr. Nicholas Lose as needed.  Raina Mina, RPH-CPP,  11/17/2022  3:42 PM   **Disclaimer: This note was dictated with voice recognition software. Similar sounding words can inadvertently be transcribed and this note may contain transcription errors which may not have been  corrected upon publication of note.**

## 2022-11-17 NOTE — Progress Notes (Signed)
Received Dental Clearance from pt DDS for prolia/xgeva/zometa.  Clearance sent to HIM to be scanned into the chart.

## 2022-11-18 ENCOUNTER — Inpatient Hospital Stay: Payer: Medicare HMO | Admitting: Hematology and Oncology

## 2022-11-18 ENCOUNTER — Inpatient Hospital Stay (HOSPITAL_BASED_OUTPATIENT_CLINIC_OR_DEPARTMENT_OTHER): Payer: Medicare HMO

## 2022-11-18 ENCOUNTER — Other Ambulatory Visit: Payer: Self-pay

## 2022-11-18 VITALS — BP 154/79 | HR 96 | Temp 97.8°F | Resp 18 | Ht 65.35 in | Wt 150.7 lb

## 2022-11-18 DIAGNOSIS — C50311 Malignant neoplasm of lower-inner quadrant of right female breast: Secondary | ICD-10-CM

## 2022-11-18 DIAGNOSIS — Z170 Estrogen receptor positive status [ER+]: Secondary | ICD-10-CM | POA: Diagnosis not present

## 2022-11-18 LAB — CMP (CANCER CENTER ONLY)
ALT: 24 U/L (ref 0–44)
AST: 77 U/L — ABNORMAL HIGH (ref 15–41)
Albumin: 4 g/dL (ref 3.5–5.0)
Alkaline Phosphatase: 109 U/L (ref 38–126)
Anion gap: 7 (ref 5–15)
BUN: 14 mg/dL (ref 8–23)
CO2: 25 mmol/L (ref 22–32)
Calcium: 9.9 mg/dL (ref 8.9–10.3)
Chloride: 105 mmol/L (ref 98–111)
Creatinine: 0.99 mg/dL (ref 0.44–1.00)
GFR, Estimated: >60 mL/min (ref >60)
Glucose, Bld: 134 mg/dL — ABNORMAL HIGH (ref 70–99)
Potassium: 4.5 mmol/L (ref 3.5–5.1)
Sodium: 137 mmol/L (ref 135–145)
Total Bilirubin: 0.4 mg/dL (ref 0.3–1.2)
Total Protein: 6.2 g/dL — ABNORMAL LOW (ref 6.5–8.1)

## 2022-11-18 LAB — CBC WITH DIFFERENTIAL (CANCER CENTER ONLY)
Abs Immature Granulocytes: 0.03 10*3/uL (ref 0.00–0.07)
Basophils Absolute: 0 10*3/uL (ref 0.0–0.1)
Basophils Relative: 1 %
Eosinophils Absolute: 0.1 10*3/uL (ref 0.0–0.5)
Eosinophils Relative: 2 %
HCT: 31.4 % — ABNORMAL LOW (ref 36.0–46.0)
Hemoglobin: 10.6 g/dL — ABNORMAL LOW (ref 12.0–15.0)
Immature Granulocytes: 1 %
Lymphocytes Relative: 39 %
Lymphs Abs: 1.4 10*3/uL (ref 0.7–4.0)
MCH: 31.5 pg (ref 26.0–34.0)
MCHC: 33.8 g/dL (ref 30.0–36.0)
MCV: 93.2 fL (ref 80.0–100.0)
Monocytes Absolute: 0.3 10*3/uL (ref 0.1–1.0)
Monocytes Relative: 8 %
Neutro Abs: 1.8 10*3/uL (ref 1.7–7.7)
Neutrophils Relative %: 49 %
Platelet Count: 60 10*3/uL — ABNORMAL LOW (ref 150–400)
RBC: 3.37 MIL/uL — ABNORMAL LOW (ref 3.87–5.11)
RDW: 16.2 % — ABNORMAL HIGH (ref 11.5–15.5)
WBC Count: 3.6 10*3/uL — ABNORMAL LOW (ref 4.0–10.5)
nRBC: 0 % (ref 0.0–0.2)

## 2022-11-18 NOTE — Assessment & Plan Note (Signed)
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  PET CT scan [id number]: Hypermetabolic Met Breast cancer Mediastinal and Hilar LN and hepatic met disease and bone disease 4. Bone Marrow Biopsy: 10/13/22: Positive for met breast cancer: Caris requested  --------------------------------------------------------------------------------------------------------------------------- Verzinio toxicities:  Return to clinic

## 2022-11-20 ENCOUNTER — Encounter: Payer: Self-pay | Admitting: Hematology and Oncology

## 2022-11-26 ENCOUNTER — Emergency Department (HOSPITAL_BASED_OUTPATIENT_CLINIC_OR_DEPARTMENT_OTHER): Payer: Medicare HMO

## 2022-11-26 ENCOUNTER — Encounter (HOSPITAL_BASED_OUTPATIENT_CLINIC_OR_DEPARTMENT_OTHER): Payer: Self-pay | Admitting: Emergency Medicine

## 2022-11-26 ENCOUNTER — Emergency Department (HOSPITAL_BASED_OUTPATIENT_CLINIC_OR_DEPARTMENT_OTHER)
Admission: EM | Admit: 2022-11-26 | Discharge: 2022-11-26 | Disposition: A | Discharge status: Home / Self Care | Payer: Medicare HMO | Attending: Emergency Medicine | Admitting: Emergency Medicine

## 2022-11-26 ENCOUNTER — Telehealth: Payer: Self-pay | Admitting: *Deleted

## 2022-11-26 ENCOUNTER — Other Ambulatory Visit: Payer: Self-pay

## 2022-11-26 ENCOUNTER — Emergency Department (HOSPITAL_BASED_OUTPATIENT_CLINIC_OR_DEPARTMENT_OTHER): Payer: Medicare HMO | Admitting: Radiology

## 2022-11-26 DIAGNOSIS — X58XXXA Exposure to other specified factors, initial encounter: Secondary | ICD-10-CM | POA: Insufficient documentation

## 2022-11-26 DIAGNOSIS — D696 Thrombocytopenia, unspecified: Secondary | ICD-10-CM | POA: Diagnosis not present

## 2022-11-26 DIAGNOSIS — S22078A Other fracture of T9-T10 vertebra, initial encounter for closed fracture: Secondary | ICD-10-CM | POA: Diagnosis not present

## 2022-11-26 DIAGNOSIS — C799 Secondary malignant neoplasm of unspecified site: Secondary | ICD-10-CM | POA: Diagnosis not present

## 2022-11-26 DIAGNOSIS — S22070A Wedge compression fracture of T9-T10 vertebra, initial encounter for closed fracture: Secondary | ICD-10-CM

## 2022-11-26 DIAGNOSIS — D649 Anemia, unspecified: Secondary | ICD-10-CM | POA: Insufficient documentation

## 2022-11-26 DIAGNOSIS — M546 Pain in thoracic spine: Secondary | ICD-10-CM | POA: Diagnosis present

## 2022-11-26 DIAGNOSIS — R0602 Shortness of breath: Secondary | ICD-10-CM | POA: Insufficient documentation

## 2022-11-26 DIAGNOSIS — Z853 Personal history of malignant neoplasm of breast: Secondary | ICD-10-CM | POA: Diagnosis not present

## 2022-11-26 LAB — CBC WITH DIFFERENTIAL/PLATELET
Abs Immature Granulocytes: 0.03 10*3/uL (ref 0.00–0.07)
Basophils Absolute: 0 10*3/uL (ref 0.0–0.1)
Basophils Relative: 1 %
Eosinophils Absolute: 0.1 10*3/uL (ref 0.0–0.5)
Eosinophils Relative: 3 %
HCT: 31.8 % — ABNORMAL LOW (ref 36.0–46.0)
Hemoglobin: 10.3 g/dL — ABNORMAL LOW (ref 12.0–15.0)
Immature Granulocytes: 1 %
Lymphocytes Relative: 36 %
Lymphs Abs: 1.5 10*3/uL (ref 0.7–4.0)
MCH: 30.6 pg (ref 26.0–34.0)
MCHC: 32.4 g/dL (ref 30.0–36.0)
MCV: 94.4 fL (ref 80.0–100.0)
Monocytes Absolute: 0.3 10*3/uL (ref 0.1–1.0)
Monocytes Relative: 8 %
Neutro Abs: 2.1 10*3/uL (ref 1.7–7.7)
Neutrophils Relative %: 51 %
Platelets: 78 10*3/uL — ABNORMAL LOW (ref 150–400)
RBC: 3.37 MIL/uL — ABNORMAL LOW (ref 3.87–5.11)
RDW: 17.2 % — ABNORMAL HIGH (ref 11.5–15.5)
Smear Review: DECREASED
WBC: 4.1 10*3/uL (ref 4.0–10.5)
nRBC: 0.5 % — ABNORMAL HIGH (ref 0.0–0.2)

## 2022-11-26 LAB — COMPREHENSIVE METABOLIC PANEL
ALT: 27 U/L (ref 0–44)
AST: 94 U/L — ABNORMAL HIGH (ref 15–41)
Albumin: 3.7 g/dL (ref 3.5–5.0)
Alkaline Phosphatase: 102 U/L (ref 38–126)
Anion gap: 9 (ref 5–15)
BUN: 15 mg/dL (ref 8–23)
CO2: 24 mmol/L (ref 22–32)
Calcium: 10.2 mg/dL (ref 8.9–10.3)
Chloride: 103 mmol/L (ref 98–111)
Creatinine, Ser: 1.08 mg/dL — ABNORMAL HIGH (ref 0.44–1.00)
GFR, Estimated: 57 mL/min — ABNORMAL LOW (ref >60)
Glucose, Bld: 117 mg/dL — ABNORMAL HIGH (ref 70–99)
Potassium: 4.3 mmol/L (ref 3.5–5.1)
Sodium: 136 mmol/L (ref 135–145)
Total Bilirubin: 0.4 mg/dL (ref 0.3–1.2)
Total Protein: 6.4 g/dL — ABNORMAL LOW (ref 6.5–8.1)

## 2022-11-26 LAB — PROTIME-INR
INR: 1.4 — ABNORMAL HIGH (ref 0.8–1.2)
Prothrombin Time: 17.4 seconds — ABNORMAL HIGH (ref 11.4–15.2)

## 2022-11-26 MED ORDER — OXYCODONE-ACETAMINOPHEN 5-325 MG PO TABS: 1.0000 | ORAL_TABLET | Freq: Four times a day (QID) | ORAL | 0 refills | Status: DC | PRN

## 2022-11-26 MED ORDER — IOHEXOL 350 MG/ML SOLN
100.0000 mL | Freq: Once | INTRAVENOUS | Status: AC | PRN
  Administered 2022-11-26: 75 mL via INTRAVENOUS

## 2022-11-26 NOTE — Discharge Instructions (Addendum)
Please take Percocet as needed for pain Call Dr. Sophronia Simas office for follow-up of their thoracic fracture Please call palliative care and establish more symptom management Please follow-up with your oncologist as scheduled Return to the emergency department if you are having worsening symptoms of at any time

## 2022-11-26 NOTE — ED Triage Notes (Signed)
SOB with thoracic back pain for several weeks. States she has been coughing as well.

## 2022-11-26 NOTE — Telephone Encounter (Signed)
Received call from pt very tearful with complaint of lung pain and shortness of breath.  Pt states her PCP is refusing to see her and has advised her to f/u with our office.  MD out of office this week, RN advised pt to be seen at ED for further evaluation and tx.  Pt verbalized understanding.

## 2022-11-26 NOTE — ED Notes (Signed)
Dc instructions reviewed with patient. Patient voiced understanding. Dc with belongings.  °

## 2022-11-26 NOTE — ED Provider Notes (Signed)
[facility] Provider Note   CSN: [id number] Arrival date & time: 11/26/22  1007     History  Chief Complaint  Patient presents with   Shortness of Breath   Back Pain    Theresa [name] is a 67 y.o. female.  HPI 67 year old female history of breast cancer, now with metastatic cancer diagnosed end of January,on verzinio with letrozole presents with mid back pain worsened over 2 weeks. She describes pain as worse at night and better most days.  She states she occasionally has some dyspnea, but then walks and is not dyspeic.  No fever, productive cough, chills      Home Medications Prior to Admission medications   Medication Sig Start Date End Date Taking? Authorizing Provider  oxyCODONE-acetaminophen (PERCOCET/ROXICET) 5-325 MG tablet Take 1 tablet by mouth every 6 (six) hours as needed for severe pain. 11/26/22  Yes Pattricia Boss, MD  abemaciclib (VERZENIO) 100 MG tablet Take 1 tablet (100 mg total) by mouth 2 (two) times daily. 10/12/22   Nicholas Lose, MD  gabapentin (NEURONTIN) 300 MG capsule Take 1 capsule (300 mg total) by mouth at bedtime. 06/30/22   Nicholas Lose, MD  hydrocortisone 2.5 % cream Apply topically 3 (three) times daily as needed. Patient not taking: Reported on 11/03/2022 12/11/19   Eppie Gibson, MD  letrozole [facility]) 2.5 MG tablet TAKE 1 TABLET BY MOUTH EVERY DAY 10/23/22   Nicholas Lose, MD  loperamide (IMODIUM) 2 MG capsule Take 2 mg by mouth in the morning and at bedtime.     [provider]  LORazepam (ATIVAN) 0.5 MG tablet Take 1 tablet (0.5 mg total) by mouth every 8 (eight) hours as needed for anxiety. Patient not taking: Reported on 11/03/2022 10/12/22   Gardenia Phlegm, NP  methocarbamol (ROBAXIN) 500 MG tablet Take 500 mg by mouth 2 (two) times daily as needed for muscle spasms.    [provider]  ondansetron (ZOFRAN) 8 MG tablet Take 1 tablet (8 mg total) by mouth every 8 (eight) hours as  needed for nausea or vomiting. 11/03/22   Nicholas Lose, MD  prochlorperazine (COMPAZINE) 10 MG tablet Take 1 tablet (10 mg total) by mouth every 6 (six) hours as needed for nausea or vomiting. 11/03/22   Nicholas Lose, MD  triamcinolone (KENALOG) 0.025 % ointment Apply 1 Application topically 2 (two) times daily. Use for one week at a time. Patient not taking: Reported on 11/03/2022 07/08/22   Nunzio Cobbs, MD  valsartan (DIOVAN) 40 MG tablet Take by mouth. 05/06/21   [provider]  venlafaxine XR (EFFEXOR-XR) 37.5 MG 24 hr capsule Take 1 capsule (37.5 mg total) by mouth daily. 02/19/22   Benay Pike, MD      Allergies    Metronidazole, Other, and Tape    Review of Systems   Review of Systems  Physical Exam Updated Vital Signs BP (!) 128/59   Pulse 77   Temp 98 F (36.7 C) (Oral)   Resp 10   Ht 1.651 m (5\' 5" )   Wt 67.1 kg   LMP 09/01/2007 (Exact Date)   SpO2 98%   BMI 24.63 kg/m  Physical Exam Vitals and nursing note reviewed.  Constitutional:      General: She is not in acute distress.    Appearance: She is well-developed.  HENT:     Head: Normocephalic and atraumatic.     Right Ear: External ear normal.     Left  Ear: External ear normal.     Nose: Nose normal.  Eyes:     Conjunctiva/sclera: Conjunctivae normal.     Pupils: Pupils are equal, round, and reactive to light.  Cardiovascular:     Rate and Rhythm: Normal rate and regular rhythm.  Pulmonary:     Effort: Pulmonary effort is normal.  Chest:     Chest wall: Deformity present. No mass.     Comments: Right breast with post op changes ow no acute abnormalities of breast noted Musculoskeletal:        General: Normal range of motion.     Cervical back: Normal range of motion and neck supple.  Skin:    General: Skin is warm and dry.  Neurological:     Mental Status: She is alert and oriented to person, place, and time.     Motor: No abnormal muscle tone.     Coordination: Coordination  normal.  Psychiatric:        Behavior: Behavior normal.        Thought Content: Thought content normal.     ED Results / Procedures / Treatments   Labs (all labs ordered are listed, but only abnormal results are displayed) Labs Reviewed  CBC WITH DIFFERENTIAL/PLATELET - Abnormal; Notable for the following components:      Result Value   RBC 3.37 (*)    Hemoglobin 10.3 (*)    HCT 31.8 (*)    RDW 17.2 (*)    Platelets 78 (*)    nRBC 0.5 (*)    All other components within normal limits  COMPREHENSIVE METABOLIC PANEL - Abnormal; Notable for the following components:   Glucose, Bld 117 (*)    Creatinine, Ser 1.08 (*)    Total Protein 6.4 (*)    AST 94 (*)    GFR, Estimated 57 (*)    All other components within normal limits  PROTIME-INR - Abnormal; Notable for the following components:   Prothrombin Time 17.4 (*)    INR 1.4 (*)    All other components within normal limits    EKG None  Radiology CT Angio Chest PE W and/or Wo Contrast  Result Date: 11/26/2022 CLINICAL DATA:  Shortness of breath. Back pain for several weeks. History of breast cancer * Tracking Code: BO * EXAM: CT ANGIOGRAPHY CHEST WITH CONTRAST TECHNIQUE: Multidetector CT imaging of the chest was performed using the standard protocol during bolus administration of intravenous contrast. Multiplanar CT image reconstructions and MIPs were obtained to evaluate the vascular anatomy. RADIATION DOSE REDUCTION: This exam was performed according to the departmental dose-optimization program which includes automated exposure control, adjustment of the mA and/or kV according to patient size and/or use of iterative reconstruction technique. CONTRAST:  82mL OMNIPAQUE IOHEXOL 350 MG/ML SOLN COMPARISON:  CT chest abdomen pelvis 09/29/2022 FINDINGS: Cardiovascular: Heart is nonenlarged. No pericardial effusion. Normal caliber thoracic aorta. No segmental or larger pulmonary embolism identified. Mediastinum/Nodes: No abnormal lymph  node enlargement identified in the axillary regions. Normal caliber thoracic esophagus. Mediastinal nodes and hilar nodes are again seen. The subcarinal node which previously measured 18 mm in short axis, today on series 4 image 59 measures 16 x 24 mm. Right paratracheal node which previously measured 7 mm in short axis, today on series 4, image 44 measures 7 x 11 mm. Right hilar node which previously measured 13 mm in transverse dimension, today measures 11 mm. Other nodes are similar as well. Lungs/Pleura: Breathing motion seen. There is some dependent areas of ground-glass  in both lungs, favoring atelectasis. No consolidation, pneumothorax or effusion. The areas of tree-in-bud nodularity are stable. Calcified nodule left upper lobe on series 6, image 40 as well. Upper Abdomen: Fatty liver infiltration. The liver appears enlarged. Large right-sided liver mass is again identified but less well defined with the contrast bolus. Smaller left hepatic mass is also seen at the edge of the imaging field. The adrenal glands are preserved. Musculoskeletal: Degenerative changes of the spine. There is some compression deformity of the T9 level with lucent sclerotic bone changes. Mother with a bone lesions are seen. The compression is new from prior exam. Please see separate dictation of thoracic spine CT. Review of the MIP images confirms the above findings. IMPRESSION: No pulmonary embolism identified.  Mild breathing motion. Stable mediastinal and hilar lymph nodes consistent with known history of neoplasm. Sclerotic and lytic bone lesions are identified. There is new compression at T9 vertebral body. Pathologic compression injury is possible. Please correlate with separate thoracic spine CT Fatty liver infiltration. Liver masses are again noted but less well defined with contrast bolus and field-of-view. Electronically Signed   By: Jill Side M.D.   On: 11/26/2022 12:51   CT T-SPINE NO CHARGE  Result Date:  11/26/2022 CLINICAL DATA:  Provided history: Metastatic disease evaluation with worsened back pain. Shortness of breath with thoracic back pain. Cough. History of breast cancer. EXAM: CT THORACIC SPINE WITHOUT CONTRAST TECHNIQUE: Multidetector CT images of the thoracic were obtained using the standard protocol without intravenous contrast. RADIATION DOSE REDUCTION: This exam was performed according to the departmental dose-optimization program which includes automated exposure control, adjustment of the mA and/or kV according to patient size and/or use of iterative reconstruction technique. COMPARISON:  CT chest/abdomen/pelvis 09/29/2022.  PET CT 09/07/2022. FINDINGS: Alignment: No significant spondylolisthesis. No significant bony retropulsion. Vertebrae: Acute/subacute T9 vertebral compression fracture (30-40% height loss), new from the prior head CT of 10/08/2022. No significant bony retropulsion at this level. No compression fracture or suspicious osseous lesion elsewhere within the thoracic spine. At the periphery of the field of view, there is a mild L3 superior endplate vertebral compression which is also new from the prior PET CT. Suggestion of irregular lucency within the imaged portions of the L3 vertebral body highly suspicious for osseous metastatic disease. Paraspinal and other soft tissues: Intrathoracic soft tissue findings reported on concurrently performed chest CT. No paraspinal mass or collection. Disc levels: Multilevel disc space narrowing, greatest at T8-T9 (moderate at this level). No appreciable significant spinal canal stenosis. No significant bony neural foraminal narrowing. IMPRESSION: 1. Acute/subacute T9 vertebral compression fracture (30-40% height loss), new from the prior PET CT of 10/08/2022. 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 2. Mild L3 superior endplate vertebral compression, also new from the prior PET  CT. Suggestion of irregular lucency within the partially imaged L3 vertebral body, highly suspicious for osseous metastatic disease. An MRI of the lumbar spine (without and with contrast) is recommended for further evaluation. Electronically Signed   By: Kellie Simmering D.O.   On: 11/26/2022 12:50   DG Chest 2 View  Result Date: 11/26/2022 CLINICAL DATA:  Shortness of breath and back pain EXAM: CHEST - 2 VIEW COMPARISON:  No prior chest radiograph available, correlation is made with CT chest abdomen pelvis 09/29/2022 FINDINGS: Cardiac and mediastinal contours are within normal limits. No focal pulmonary opacity. No pleural effusion or pneumothorax. No acute osseous abnormality. IMPRESSION: No acute cardiopulmonary process.  Electronically Signed   By: Merilyn Baba M.D.   On: 11/26/2022 10:59    Procedures Procedures    Medications Ordered in ED Medications  iohexol (OMNIPAQUE) 350 MG/ML injection 100 mL (75 mLs Intravenous Contrast Given 11/26/22 1209)    ED Course/ Medical Decision Making/ A&P Clinical Course as of 11/26/22 1447  Thu Nov 26, 2022  1218 CBC is reviewed and interpreted with normal white blood cell count and anemia with hemoglobin 10.3 which is stable from first prior Platelets are decreased at 78,000 improved from first prior [DR]  1418 CT angio chest reviewed and interpreted without acute abnormalities noted and radiologist interpretations notes new compression at T9 vertebral body probable pathologic compression Eksir No evidence of pulmonary embolism stable mediastinal hilar lymph nodes [DR]    Clinical Course User Index [DR] Pattricia Boss, MD                             Medical Decision Making Amount and/or Complexity of Data Reviewed Labs: ordered. Radiology: ordered.  Risk Prescription drug management.    67 year old female presents today complaining of bilateral upper back pain.  She has a recent diagnosis of metastatic cancer.  Patient was imaged here in the  ED and she has a T9 compression fracture.  This clinically correlates with her pain. CBC shows stable anemia and thrombocytopenia which is improved Complete metabolic panel with elevated AST otherwise within normal limits Patient has acute T9 vertebral compression fracture She has mild L3 superior endplate compression Patient has been moving around well. Will give referral to neurosurgery Will give pain medicine Plan referral to palliative care        Final Clinical Impression(s) / ED Diagnoses Final diagnoses:  Compression fracture of T9 vertebra, initial encounter [facility])    Rx / DC Orders ED Discharge Orders          Ordered    oxyCODONE-acetaminophen (PERCOCET/ROXICET) 5-325 MG tablet  Every 6 hours PRN        11/26/22 1432              Pattricia Boss, MD 11/26/22 1447

## 2022-11-27 ENCOUNTER — Other Ambulatory Visit: Payer: Self-pay

## 2022-11-27 ENCOUNTER — Telehealth: Payer: Self-pay

## 2022-11-27 ENCOUNTER — Telehealth: Payer: Self-pay | Admitting: Hematology and Oncology

## 2022-11-27 DIAGNOSIS — Z170 Estrogen receptor positive status [ER+]: Secondary | ICD-10-CM

## 2022-11-27 NOTE — Telephone Encounter (Signed)
Scheduled appointment per staff message. Patient is aware of the made appointment. 

## 2022-11-27 NOTE — Telephone Encounter (Signed)
Called Pt to discuss upcoming appts. Since Pt is now scheduled with Dr. Lindi Adie on 11/30/22 for hospital f/u, Gilford Rile stated he does not need to see her on 12/02/22 and instead can be rescheduled for 12/30/22. Appts changed, Pt updated and verbalized understanding.

## 2022-11-30 ENCOUNTER — Inpatient Hospital Stay: Payer: Medicare HMO | Attending: Hematology and Oncology | Admitting: Hematology and Oncology

## 2022-11-30 ENCOUNTER — Other Ambulatory Visit: Payer: Self-pay | Admitting: *Deleted

## 2022-11-30 ENCOUNTER — Other Ambulatory Visit: Payer: Self-pay | Admitting: Hematology and Oncology

## 2022-11-30 ENCOUNTER — Other Ambulatory Visit: Payer: Self-pay

## 2022-11-30 ENCOUNTER — Inpatient Hospital Stay: Payer: Medicare HMO

## 2022-11-30 ENCOUNTER — Telehealth: Payer: Self-pay | Admitting: *Deleted

## 2022-11-30 VITALS — BP 138/63 | HR 97 | Temp 97.4°F | Resp 18 | Ht 65.0 in | Wt 152.4 lb

## 2022-11-30 DIAGNOSIS — C787 Secondary malignant neoplasm of liver and intrahepatic bile duct: Secondary | ICD-10-CM | POA: Insufficient documentation

## 2022-11-30 DIAGNOSIS — Z923 Personal history of irradiation: Secondary | ICD-10-CM | POA: Diagnosis not present

## 2022-11-30 DIAGNOSIS — C50311 Malignant neoplasm of lower-inner quadrant of right female breast: Secondary | ICD-10-CM | POA: Diagnosis not present

## 2022-11-30 DIAGNOSIS — C7951 Secondary malignant neoplasm of bone: Secondary | ICD-10-CM | POA: Insufficient documentation

## 2022-11-30 DIAGNOSIS — N63 Unspecified lump in unspecified breast: Secondary | ICD-10-CM

## 2022-11-30 DIAGNOSIS — C419 Malignant neoplasm of bone and articular cartilage, unspecified: Secondary | ICD-10-CM

## 2022-11-30 DIAGNOSIS — Z79811 Long term (current) use of aromatase inhibitors: Secondary | ICD-10-CM | POA: Insufficient documentation

## 2022-11-30 DIAGNOSIS — Z170 Estrogen receptor positive status [ER+]: Secondary | ICD-10-CM

## 2022-11-30 DIAGNOSIS — Z79899 Other long term (current) drug therapy: Secondary | ICD-10-CM | POA: Diagnosis not present

## 2022-11-30 LAB — CBC WITH DIFFERENTIAL (CANCER CENTER ONLY)
Abs Immature Granulocytes: 0.02 10*3/uL (ref 0.00–0.07)
Basophils Absolute: 0 10*3/uL (ref 0.0–0.1)
Basophils Relative: 1 %
Eosinophils Absolute: 0.2 10*3/uL (ref 0.0–0.5)
Eosinophils Relative: 4 %
HCT: 31.4 % — ABNORMAL LOW (ref 36.0–46.0)
Hemoglobin: 10.4 g/dL — ABNORMAL LOW (ref 12.0–15.0)
Immature Granulocytes: 1 %
Lymphocytes Relative: 33 %
Lymphs Abs: 1.4 10*3/uL (ref 0.7–4.0)
MCH: 31.7 pg (ref 26.0–34.0)
MCHC: 33.1 g/dL (ref 30.0–36.0)
MCV: 95.7 fL (ref 80.0–100.0)
Monocytes Absolute: 0.3 10*3/uL (ref 0.1–1.0)
Monocytes Relative: 7 %
Neutro Abs: 2.3 10*3/uL (ref 1.7–7.7)
Neutrophils Relative %: 54 %
Platelet Count: 66 10*3/uL — ABNORMAL LOW (ref 150–400)
RBC: 3.28 MIL/uL — ABNORMAL LOW (ref 3.87–5.11)
RDW: 17.4 % — ABNORMAL HIGH (ref 11.5–15.5)
WBC Count: 4.3 10*3/uL (ref 4.0–10.5)
nRBC: 0.7 % — ABNORMAL HIGH (ref 0.0–0.2)

## 2022-11-30 LAB — CMP (CANCER CENTER ONLY)
ALT: 27 U/L (ref 0–44)
AST: 99 U/L — ABNORMAL HIGH (ref 15–41)
Albumin: 3.8 g/dL (ref 3.5–5.0)
Alkaline Phosphatase: 133 U/L — ABNORMAL HIGH (ref 38–126)
Anion gap: 7 (ref 5–15)
BUN: 13 mg/dL (ref 8–23)
CO2: 25 mmol/L (ref 22–32)
Calcium: 10.2 mg/dL (ref 8.9–10.3)
Chloride: 105 mmol/L (ref 98–111)
Creatinine: 1.21 mg/dL — ABNORMAL HIGH (ref 0.44–1.00)
GFR, Estimated: 50 mL/min — ABNORMAL LOW (ref >60)
Glucose, Bld: 104 mg/dL — ABNORMAL HIGH (ref 70–99)
Potassium: 4.4 mmol/L (ref 3.5–5.1)
Sodium: 137 mmol/L (ref 135–145)
Total Bilirubin: 0.5 mg/dL (ref 0.3–1.2)
Total Protein: 6.4 g/dL — ABNORMAL LOW (ref 6.5–8.1)

## 2022-11-30 NOTE — Telephone Encounter (Signed)
Per MD request IR was contacted per given number of [phone]- this RN explained referral request  for "OsteoCool" procedure including to ask for Theresa [name] Theresa [name] states she can schedule the requested procedure - will need order entered for IR eval and management.  Order placed with verification as received and correct per [facility] They will contact the patient to schedule.

## 2022-11-30 NOTE — Progress Notes (Signed)
Patient Care Team: [name], MD as PCP - General (Internal Medicine) Stark Klein, MD as Consulting Physician (General Surgery) Eppie Gibson, MD as Attending Physician (Radiation Oncology) Nunzio Cobbs, MD as Consulting Physician (Obstetrics and Gynecology) Rolm Bookbinder, MD as Consulting Physician (Dermatology) Ardis Hughs, MD as Consulting Physician (Urology) Juanita Craver, MD as Consulting Physician (Gastroenterology) Renette Butters, MD as Attending Physician (Orthopedic Surgery) Nicholas Lose, MD as Consulting Physician (Hematology and Oncology)  DIAGNOSIS:  Encounter Diagnoses  Name Primary?   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 site     SUMMARY OF ONCOLOGIC HISTORY: Oncology History  Malignant neoplasm of lower-inner quadrant of right breast of female, estrogen receptor positive  07/03/2019 Initial Diagnosis   right lower inner quadrant biopsy, for a clinicaly multiple T1c N0, stage IA invasive ductal carcinoma, grade 2, E-cadherin positive, strongly estrogen receptor positive but progesterone receptor negative and HER-2 not amplified, with an MIB-1 of 5%             (a) breast MRI 08/07/2019 showed 2 additional suspicious areas in the right breast and 1 in the left breast              (b) biopsy of all 3 areas 08/21/2019 showed no malignancy   07/26/2019 -  Anti-estrogen oral therapy   tamoxifen started neoadjuvantly 07/26/2019; to be continued for 10 years.  S/p TAH/BSO on 01/16/2020 with benign pathology   08/04/2019 Genetic Testing   Negative genetic testing:  No pathogenic variants detected on the [facility] Breast Cancer STAT panel or the Common Hereditary Cancers panel. The report date is 08/04/2019.  The STAT Breast cancer panel offered by [facility] includes sequencing and rearrangement analysis for the following 9 genes:  ATM, BRCA1,  BRCA2, CDH1, CHEK2, PALB2, PTEN, STK11 and TP53.  The Common Hereditary Cancers Panel offered by [facility] includes sequencing and/or deletion duplication testing of the following 48 genes: APC, ATM, AXIN2, BARD1, BMPR1A, BRCA1, BRCA2, BRIP1, CDH1, CDK4, CDKN2A (p14ARF), CDKN2A (p16INK4a), CHEK2, CTNNA1, DICER1, EPCAM (Deletion/duplication testing only), GREM1 (promoter region deletion/duplication testing only), KIT, MEN1, MLH1, MSH2, MSH3, MSH6, MUTYH, NBN, NF1, NHTL1, PALB2, PDGFRA, PMS2, POLD1, POLE, PTEN, RAD50, RAD51C, RAD51D, RNF43, SDHB, SDHC, SDHD, SMAD4, SMARCA4. STK11, TP53, TSC1, TSC2, and VHL.  The following genes were evaluated for sequence changes only: SDHA and HOXB13 c.251G>A variant only.    10/04/2019 Cancer Staging   Staging form: Breast, AJCC 8th Edition - Pathologic stage from 10/04/2019: Stage IIB (pT2, pN1a(sn), cM0, G2, ER+, PR-, HER2-) - Signed by Gardenia Phlegm, NP on 10/18/2019   10/04/2019 Surgery    right lumpectomy and sentinel lymph node sampling 10/04/2019 showed a pT2 pN1, stage IIB invasive ductal carcinoma, grade 2, with positive lymphovascular invasion and a positive superior margin             (a) 3 sentinel lymph nodes removed, one with macro metastatic deposit, 1 with a micrometastatic deposit             (b) additional surgery for margin clearance   10/04/2019 Miscellaneous    MammaPrint on the 10/04/2019 sample shows a low risk luminal A tumor predicting a 5-year metastasis free survival of 96% without chemotherapy, and a chemotherapy benefit of less than 1.5%    11/27/2019 - 01/05/2020 Radiation Therapy    Site Technique Total Dose (Gy) Dose per Fx (Gy) Completed Fx  Beam Energies  Breast, Right: Breast_Rt 3D 50/50 2 25/25 6X, 10X  Breast, Right: Breast_Rt_SCV_PAB 3D 50/50 2 25/25 6X, 10X  Breast, Right: Breast_Rt_Bst 3D 10/10 2 5/5 6X, 10X    09/29/2022 Imaging   IMPRESSION: 1. Surgical changes involving the right medial breast. No obvious recurrent breast  mass or chest wall mass. 2. Mediastinal and hilar lymphadenopathy suggesting metastatic disease. 3. Patchy tree-in-bud type nodularity in the lungs suggesting chronic inflammation or atypical infection such as MAC. No definite pulmonary metastatic nodules. 4. Large partially necrotic mass involving most of the right hepatic lobe and a 2.5 cm lesion in segment 3 of the liver consistent with metastatic disease. 5. 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   10/06/2022 Initial Biopsy   Liver biopsy: + for malignancy, metastatic poorly differentiated carcinoma, likely breast origin, biomarkers pending, insufficient tissue for molecular testing. (Resulted in EPIC on 10/13/2022)   10/07/2022 PET scan   PET scan on 10/07/2022 that showed hypermetabolic metastatic breast cancer with hypermetabolic mediastinal and hilar adenopathy, hypermetabolic hepatic metastatic disease and bone disease.  There were no findings for pulmonary metastatic disease.   10/21/2022 -  Anti-estrogen oral therapy   Verzinio and Letrozole     CHIEF COMPLIANT: Follow-up on letrozole/ Verzenio  INTERVAL HISTORY: Theresa [name] is a 67 y.o female with the above-mentioned Estrogen receptor positive breast cancer. Currently on letrozole. She presents to the clinic for a follow-up. She reports that the muscle in her back is getting worst. It feels like a stabbing pain. She only feels it when she lay down. She says she is fine while sitting. She says riding horses is the only thing that keeps her mind off of things. She is taking oxycodone for pain. She having some shortness of breath with exertion.   ALLERGIES:  is allergic to metronidazole, other, and tape.  MEDICATIONS:  Current Outpatient Medications  Medication Sig Dispense Refill   abemaciclib (VERZENIO) 100 MG tablet Take 1 tablet (100 mg total) by mouth 2 (two) times daily. 60 tablet 1   gabapentin (NEURONTIN) 300 MG capsule Take 1  capsule (300 mg total) by mouth at bedtime. 90 capsule 4   letrozole (FEMARA) 2.5 MG tablet TAKE 1 TABLET BY MOUTH EVERY DAY 90 tablet 1   loperamide (IMODIUM) 2 MG capsule Take 2 mg by mouth in the morning and at bedtime.      methocarbamol (ROBAXIN) 500 MG tablet Take 500 mg by mouth 2 (two) times daily as needed for muscle spasms.     ondansetron (ZOFRAN) 8 MG tablet Take 1 tablet (8 mg total) by mouth every 8 (eight) hours as needed for nausea or vomiting. 30 tablet 2   oxyCODONE-acetaminophen (PERCOCET/ROXICET) 5-325 MG tablet Take 1 tablet by mouth every 6 (six) hours as needed for severe pain. 15 tablet 0   prochlorperazine (COMPAZINE) 10 MG tablet Take 1 tablet (10 mg total) by mouth every 6 (six) hours as needed for nausea or vomiting. 30 tablet 2   valsartan (DIOVAN) 40 MG tablet Take by mouth.     venlafaxine XR (EFFEXOR-XR) 37.5 MG 24 hr capsule Take 1 capsule (37.5 mg total) by mouth daily. 90 capsule 4   hydrocortisone 2.5 % cream Apply topically 3 (three) times daily as needed. (Patient not taking: Reported on 11/03/2022) 28 g 3   LORazepam (ATIVAN) 0.5 MG tablet Take 1 tablet (0.5 mg total) by mouth every 8 (eight) hours as needed for anxiety. (Patient not  taking: Reported on 11/03/2022) 15 tablet 0   triamcinolone (KENALOG) 0.025 % ointment Apply 1 Application topically 2 (two) times daily. Use for one week at a time. (Patient not taking: Reported on 11/03/2022) 30 g 1   No current facility-administered medications for this visit.    PHYSICAL EXAMINATION: ECOG PERFORMANCE STATUS: 1 - Symptomatic but completely ambulatory  Vitals:   11/30/22 0946  BP: 138/63  Pulse: 97  Resp: 18  Temp: (!) 97.4 F (36.3 C)  SpO2: 94%   Filed Weights   11/30/22 0946  Weight: 152 lb 6.4 oz (69.1 kg)      LABORATORY DATA:  I have reviewed the data as listed    Latest Ref Rng & Units 11/30/2022    9:40 AM 11/26/2022   10:34 AM 11/18/2022   10:25 AM  CMP  Glucose 70 - 99 mg/dL 104  117  134    BUN 8 - 23 mg/dL 13  15  14    Creatinine 0.44 - 1.00 mg/dL 1.21  1.08  0.99   Sodium 135 - 145 mmol/L 137  136  137   Potassium 3.5 - 5.1 mmol/L 4.4  4.3  4.5   Chloride 98 - 111 mmol/L 105  103  105   CO2 22 - 32 mmol/L 25  24  25    Calcium 8.9 - 10.3 mg/dL 10.2  10.2  9.9   Total Protein 6.5 - 8.1 g/dL 6.4  6.4  6.2   Total Bilirubin 0.3 - 1.2 mg/dL 0.5  0.4  0.4   Alkaline Phos 38 - 126 U/L 133  102  109   AST 15 - 41 U/L 99  94  77   ALT 0 - 44 U/L 27  27  24      Lab Results  Component Value Date   WBC 4.3 11/30/2022   HGB 10.4 (L) 11/30/2022   HCT 31.4 (L) 11/30/2022   MCV 95.7 11/30/2022   PLT 66 (L) 11/30/2022   NEUTROABS 2.3 11/30/2022    ASSESSMENT & PLAN:  Malignant neoplasm of lower-inner quadrant of right breast of female, estrogen receptor positive ([place]) 07/03/2019: Right breast LIQ T1CN0 stage Ia grade 2 IDC ER 95% PR negative HER2 negative Ki-67 5% 10/04/2019: Right lumpectomy: T2N1 stage IIb grade 2 IDC positive lymphovascular invasion 3 lymph nodes removed 1 with macro N1 micrometastatic disease 10/04/2019: MammaPrint: Low risk 01/05/2020: Completed adjuvant radiation 08/04/2019: Genetics: Negative November 2020: Tamoxifen ------------------------------------------------------------------- 09/28/2022: Patient presented with platelets 65 INR 1.9 elevated LFTs and hypercalcemia  CT CAP 09/29/2022: Mediastinal and hilar lymphadenopathy suggestive of metastatic disease.  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  PET CT scan [id number]: Hypermetabolic Met Breast cancer Mediastinal and Hilar LN and hepatic met disease and bone disease 4. Bone Marrow Biopsy: 10/13/22: Positive for met breast cancer: Caris requested   --------------------------------------------------------------------------------------------------------------------------- Verzinio toxicities: Mild intermittent diarrhea: Managing extremely well.  We will maintain her on 100 mg p.o. twice daily dosing. Mild nausea: Takes a nausea medication in the morning and that seems to be helping.  Lab review: 10/06/2022: Platelets 57, hemoglobin 10.6 11/26/2022: Platelets 78, hemoglobin 10.3 11/30/2022: Platelets 66, hemoglobin 10.4  Emergency room visit for back pain: CT of the back revealed T9 compression fracture, CT angiogram: No PE  the mass in the liver appeared to be less conspicuous and the lymph nodes are stable rest of the bone metastases are stable I discussed the case with interventional radiology Dr. Laurence Ferrari who thought that she would be a good candidate for osteocool (radiofrequency ablation and kyphoplasty) subsequently she could also get some radiation if there is malignant cells in there. We will try to get her into see them.  I would like to add Xgeva to her treatment plan starting on 12/16/2022.  Instructed to take calcium and vitamin D. It is unclear if the fracture is related to metastatic breast cancer versus some degree of osteoporosis.  She likes to ride horses and we would like to have her continue to ride horses as this is the only thing that keeps her happy and at peace with everything going on with her health.   No orders of the defined types were placed in this encounter.  The patient has a good understanding of the overall plan. she agrees with it. she will call with any problems that may develop before the next visit here. Total time spent: 30 mins including face to face time and time spent for planning, charting and co-ordination of care   Harriette Ohara, MD 11/30/22    I Gardiner Coins am acting as a Education administrator for [employer]  I have reviewed the above documentation for accuracy and completeness, and I agree with  the above.

## 2022-11-30 NOTE — Assessment & Plan Note (Signed)
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  PET CT scan [id number]: Hypermetabolic Met Breast cancer Mediastinal and Hilar LN and hepatic met disease and bone disease 4. Bone Marrow Biopsy: 10/13/22: Positive for met breast cancer: Caris requested  --------------------------------------------------------------------------------------------------------------------------- Verzinio toxicities: Mild intermittent diarrhea: Managing extremely well.  We will maintain her on 100 mg p.o. twice daily dosing. Mild nausea: Takes a nausea medication in the morning and that seems to be helping.  Lab review: 10/06/2022: Platelets 57, hemoglobin 10.6 11/26/2022: Platelets 78, hemoglobin 10.3  Her plan is to obtain the scans at the end of May to assess response to treatment. As long as she is riding her horses she appears to be in good spirits.

## 2022-12-01 ENCOUNTER — Encounter: Payer: Self-pay | Admitting: Hematology and Oncology

## 2022-12-01 ENCOUNTER — Encounter: Payer: Self-pay | Admitting: *Deleted

## 2022-12-01 ENCOUNTER — Telehealth: Payer: Self-pay | Admitting: Hematology and Oncology

## 2022-12-01 NOTE — Telephone Encounter (Signed)
Scheduled appointment per 4/1 los. Patient is aware of the made appointment.

## 2022-12-01 NOTE — Progress Notes (Signed)
Due to poor venous access, MD requesting pt receive Xgeva.  RN will alert PA team to obtain coverage.

## 2022-12-02 ENCOUNTER — Inpatient Hospital Stay: Payer: Medicare HMO | Admitting: Pharmacist

## 2022-12-02 ENCOUNTER — Inpatient Hospital Stay: Payer: Medicare HMO

## 2022-12-04 ENCOUNTER — Ambulatory Visit
Admission: RE | Admit: 2022-12-04 | Discharge: 2022-12-04 | Disposition: A | Discharge status: Home / Self Care | Payer: Medicare HMO | Source: Ambulatory Visit | Attending: Hematology and Oncology | Admitting: Hematology and Oncology

## 2022-12-04 DIAGNOSIS — N63 Unspecified lump in unspecified breast: Secondary | ICD-10-CM

## 2022-12-04 DIAGNOSIS — C419 Malignant neoplasm of bone and articular cartilage, unspecified: Secondary | ICD-10-CM

## 2022-12-04 NOTE — Consult Note (Signed)
Chief Complaint: Patient was seen in consultation today for Painful pathologic thoracic compression fracture deformity at the request of Gudena,Vinay  Referring Physician(s): Gudena,Vinay  History of Present Illness: Theresa [name] is a 67 y.o. female  with a history of metastatic breast carcinoma with documented bone metastases on bone marrow biopsy as well as documented liver   metastases,Who developed acute midthoracic pain in early March, Etiology uncertain perhaps related to coughing episode.  CT on 11/26/2022 confirmed T9 vertebral compression fracture with 30-40% height loss, new since most recent previous imaging from 10/08/2022(on the PET/CT at that time, hypermetabolic activity in this vertebral body was evident.) Lytic destructive lesion is evident centrally in the vertebral body.  No extraosseous tumor extension. She rates her pain 6 out of 10 on the visual analog pain scale.  With oxycodone which she takes only at night, it takes the pain to 0 when she is not moving.  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 She has tolerated moderate sedation in the past for previous procedures.   Past Medical History:  Diagnosis Date   Breast cancer, right (HCC)    Contact lens/glasses fitting    wears contacts or glasses   Diarrhea    --post gallbladder surgery   Family history of bone cancer    Family history of brain cancer    Family history of stomach cancer    Family history of uterine cancer    Fibroids    History of radiation therapy    Hypertension    No pertinent past medical history    Personal history of radiation therapy    PONV (postoperative nausea and vomiting)     Right ovarian cyst    Vertigo     Past Surgical History:  Procedure Laterality Date   BLADDER SUSPENSION     BREAST BIOPSY  09/08/2012   Procedure: BREAST BIOPSY;  Surgeon: Adolph Pollack, MD;  Location: [facility];  Service: General;  Laterality: Left;  remove left breast mass   BREAST LUMPECTOMY Right 10/2019   BREAST LUMPECTOMY WITH RADIOACTIVE SEED AND SENTINEL LYMPH NODE BIOPSY Right 10/04/2019   Procedure: RIGHT BREAST LUMPECTOMY WITH BRACKETED RADIOACTIVE SEEDS, RIGHT BREAST RADIOACTIVE SEED GUIDED EXCISION BIOPSY, AND RIGHT SENTINEL LYMPH NODE BIOPSY;  Surgeon: Almond Lint, MD;  Location: [facility];  Service: General;  Laterality: Right;   BREAST SURGERY     lumpectomy x2   BUNIONECTOMY Right 01/2016   CHOLECYSTECTOMY     COLONOSCOPY     CYSTOSCOPY N/A 01/16/2020   Procedure: CYSTOSCOPY;  Surgeon: Patton Salles, MD;  Location: [facility];  Service: Gynecology;  Laterality: N/A;   MASTOPEXY Right 10/04/2019   Procedure: RIGHT BREAST MASTOPEXY;  Surgeon: Almond Lint, MD;  Location: [facility];  Service: General;  Laterality: Right;   RE-EXCISION OF BREAST LUMPECTOMY Right 10/31/2019   Procedure: RIGHT RE-EXCISION OF BREAST LUMPECTOMY;  Surgeon: Almond Lint, MD;  Location: [facility];  Service: General;  Laterality: Right;   TOTAL LAPAROSCOPIC HYSTERECTOMY WITH BILATERAL SALPINGO OOPHORECTOMY N/A 01/16/2020   Procedure: TOTAL LAPAROSCOPIC HYSTERECTOMY WITH BILATERAL SALPINGO OOPHORECTOMY/COLLECTION OF PELVIC WASHINGS, LYSIS OF ADHESIONS;  Surgeon: Ricki Miller  Shirley Friar, MD;  Location: [facility];  Service: Gynecology;  Laterality: N/A;  collection of pelvic washings   TUBAL LIGATION      Allergies: Metronidazole, Other, and Tape  Medications: Prior to Admission medications   Medication Sig Start Date End Date Taking? Authorizing Provider  abemaciclib (VERZENIO) 100 MG  tablet Take 1 tablet (100 mg total) by mouth 2 (two) times daily. 10/12/22  Yes Serena Croissant, MD  gabapentin (NEURONTIN) 300 MG capsule Take 1 capsule (300 mg total) by mouth at bedtime. 06/30/22  Yes Serena Croissant, MD  letrozole (FEMARA) 2.5 MG tablet TAKE 1 TABLET BY MOUTH EVERY DAY 10/23/22  Yes Serena Croissant, MD  loperamide (IMODIUM) 2 MG capsule Take 2 mg by mouth in the morning and at bedtime.    Yes [provider]  ondansetron (ZOFRAN) 8 MG tablet Take 1 tablet (8 mg total) by mouth every 8 (eight) hours as needed for nausea or vomiting. 11/03/22  Yes Serena Croissant, MD  oxyCODONE-acetaminophen (PERCOCET/ROXICET) 5-325 MG tablet Take 1 tablet by mouth every 6 (six) hours as needed for severe pain. 11/26/22  Yes Margarita Grizzle, MD  prochlorperazine (COMPAZINE) 10 MG tablet Take 1 tablet (10 mg total) by mouth every 6 (six) hours as needed for nausea or vomiting. 11/03/22  Yes Serena Croissant, MD  valsartan (DIOVAN) 40 MG tablet Take by mouth. 05/06/21  Yes [provider]  venlafaxine XR (EFFEXOR-XR) 37.5 MG 24 hr capsule Take 1 capsule (37.5 mg total) by mouth daily. 02/19/22  Yes Rachel Moulds, MD  hydrocortisone 2.5 % cream Apply topically 3 (three) times daily as needed. Patient not taking: Reported on 11/03/2022 12/11/19   Lonie Peak, MD  LORazepam (ATIVAN) 0.5 MG tablet Take 1 tablet (0.5 mg total) by mouth every 8 (eight) hours as needed for anxiety. Patient not taking: Reported on 12/04/2022 10/12/22   Loa Socks, NP  methocarbamol (ROBAXIN) 500 MG tablet Take 500 mg by mouth 2 (two) times daily as needed for muscle spasms. Patient not taking: Reported on 12/04/2022    [provider]  triamcinolone (KENALOG) 0.025 % ointment Apply 1 Application topically 2 (two) times daily. Use for one week at a time. Patient not taking: Reported on 11/03/2022 07/08/22   Patton Salles, MD     Family History  Problem Relation Age of Onset   Hypertension Father     Stomach cancer Father        may have been colon, diagnosed in his 3s   Depression Mother    Dementia Mother    Thyroid disease Sister    Heart disease Sister    Brain cancer Maternal Aunt 32   Cancer Maternal Grandmother        undetermined type, diagnosed in her 13s   Bone cancer Paternal Grandfather 32   Uterine cancer Paternal Aunt 41   Uterine cancer Cousin        paternal 1st cousin, diagnosed in her late 66s   Uterine cancer Cousin 28       paternal 1st cousin    Social History   Socioeconomic History   Marital status: Married    Spouse name: Daryl    Number of children: Not on file   Years of education: Not on file   Highest education level: Not on file  Occupational History   Not on file  Tobacco Use   Smoking status: Former    Types: Cigarettes    Quit  date: 03/15/1996    Years since quitting: 26.7   Smokeless tobacco: Never  Vaping Use   Vaping Use: Never used  Substance and Sexual Activity   Alcohol use: Not Currently    Comment: occ wine   Drug use: No   Sexual activity: Yes    Partners: Male    Birth control/protection: Surgical    Comment: BTL/hyst  Other Topics Concern   Not on file  Social History Narrative   Not on file   Social Determinants of Health   Financial Resource Strain: Not on file  Food Insecurity: Not on file  Transportation Needs: Not on file  Physical Activity: Not on file  Stress: Not on file  Social Connections: Not on file    ECOG Status: 2 - Symptomatic, <50% confined to bed  Review of Systems: A 12 point ROS discussed and pertinent positives are indicated in the HPI above.  All other systems are negative.  Review of Systems  Vital Signs: BP (!) 142/75 (BP Location: Left Arm, Patient Position: Sitting, Cuff Size: Normal)   Pulse 82   Temp 98.2 F (36.8 C) (Oral)   Resp 14   LMP 09/01/2007 (Exact Date)   SpO2 96%       Physical Exam Constitutional: Oriented to person, place, and time. Well-developed  and well-nourished. No distress.   HENT:  Head: Normocephalic and atraumatic.  Eyes: Conjunctivae and EOM are normal. Right eye exhibits no discharge. Left eye exhibits no discharge. No scleral icterus.  Neck: No JVD present.  Pulmonary/Chest: Effort normal. No stridor. No respiratory distress.  Abdomen: soft, non distended Neurological:  alert and oriented to person, place, and time.  Skin: Skin is warm and dry.  not diaphoretic.  Psychiatric:   normal mood and affect.   behavior is normal. Judgment and thought content normal.  Musculoskeletal: Tenderness over the mid thoracic spine       Imaging: CT Angio Chest PE W and/or Wo Contrast  Result Date: 11/26/2022 CLINICAL DATA:  Shortness of breath. Back pain for several weeks. History of breast cancer * Tracking Code: BO * EXAM: CT ANGIOGRAPHY CHEST WITH CONTRAST TECHNIQUE: Multidetector CT imaging of the chest was performed using the standard protocol during bolus administration of intravenous contrast. Multiplanar CT image reconstructions and MIPs were obtained to evaluate the vascular anatomy. RADIATION DOSE REDUCTION: This exam was performed according to the departmental dose-optimization program which includes automated exposure control, adjustment of the mA and/or kV according to patient size and/or use of iterative reconstruction technique. CONTRAST:  75mL OMNIPAQUE IOHEXOL 350 MG/ML SOLN COMPARISON:  CT chest abdomen pelvis 09/29/2022 FINDINGS: Cardiovascular: Heart is nonenlarged. No pericardial effusion. Normal caliber thoracic aorta. No segmental or larger pulmonary embolism identified. Mediastinum/Nodes: No abnormal lymph node enlargement identified in the axillary regions. Normal caliber thoracic esophagus. Mediastinal nodes and hilar nodes are again seen. The subcarinal node which previously measured 18 mm in short axis, today on series 4 image 59 measures 16 x 24 mm. Right paratracheal node which previously measured 7 mm in short  axis, today on series 4, image 44 measures 7 x 11 mm. Right hilar node which previously measured 13 mm in transverse dimension, today measures 11 mm. Other nodes are similar as well. Lungs/Pleura: Breathing motion seen. There is some dependent areas of ground-glass in both lungs, favoring atelectasis. No consolidation, pneumothorax or effusion. The areas of tree-in-bud nodularity are stable. Calcified nodule left upper lobe on series 6, image 40 as well. Upper Abdomen: Fatty  liver infiltration. The liver appears enlarged. Large right-sided liver mass is again identified but less well defined with the contrast bolus. Smaller left hepatic mass is also seen at the edge of the imaging field. The adrenal glands are preserved. Musculoskeletal: Degenerative changes of the spine. There is some compression deformity of the T9 level with lucent sclerotic bone changes. Mother with a bone lesions are seen. The compression is new from prior exam. Please see separate dictation of thoracic spine CT. Review of the MIP images confirms the above findings. IMPRESSION: No pulmonary embolism identified.  Mild breathing motion. Stable mediastinal and hilar lymph nodes consistent with known history of neoplasm. Sclerotic and lytic bone lesions are identified. There is new compression at T9 vertebral body. Pathologic compression injury is possible. Please correlate with separate thoracic spine CT Fatty liver infiltration. Liver masses are again noted but less well defined with contrast bolus and field-of-view. Electronically Signed   By: Karen Kays M.D.   On: 11/26/2022 12:51   CT T-SPINE NO CHARGE  Result Date: 11/26/2022 CLINICAL DATA:  Provided history: Metastatic disease evaluation with worsened back pain. Shortness of breath with thoracic back pain. Cough. History of breast cancer. EXAM: CT THORACIC SPINE WITHOUT CONTRAST TECHNIQUE: Multidetector CT images of the thoracic were obtained using the standard protocol without  intravenous contrast. RADIATION DOSE REDUCTION: This exam was performed according to the departmental dose-optimization program which includes automated exposure control, adjustment of the mA and/or kV according to patient size and/or use of iterative reconstruction technique. COMPARISON:  CT chest/abdomen/pelvis 09/29/2022.  PET CT 09/07/2022. FINDINGS: Alignment: No significant spondylolisthesis. No significant bony retropulsion. Vertebrae: Acute/subacute T9 vertebral compression fracture (30-40% height loss), new from the prior head CT of 10/08/2022. No significant bony retropulsion at this level. No compression fracture or suspicious osseous lesion elsewhere within the thoracic spine. At the periphery of the field of view, there is a mild L3 superior endplate vertebral compression which is also new from the prior PET CT. Suggestion of irregular lucency within the imaged portions of the L3 vertebral body highly suspicious for osseous metastatic disease. Paraspinal and other soft tissues: Intrathoracic soft tissue findings reported on concurrently performed chest CT. No paraspinal mass or collection. Disc levels: Multilevel disc space narrowing, greatest at T8-T9 (moderate at this level). No appreciable significant spinal canal stenosis. No significant bony neural foraminal narrowing. IMPRESSION: 1. Acute/subacute T9 vertebral compression fracture (30-40% height loss), new from the prior PET CT of 10/08/2022. 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 2. Mild L3 superior endplate vertebral compression, also new from the prior PET CT. Suggestion of irregular lucency within the partially imaged L3 vertebral body, highly suspicious for osseous metastatic disease. An MRI of the lumbar spine (without and with contrast) is recommended for further evaluation. Electronically Signed   By: Jackey Loge D.O.   On: 11/26/2022 12:50   DG Chest 2  View  Result Date: 11/26/2022 CLINICAL DATA:  Shortness of breath and back pain EXAM: CHEST - 2 VIEW COMPARISON:  No prior chest radiograph available, correlation is made with CT chest abdomen pelvis 09/29/2022 FINDINGS: Cardiac and mediastinal contours are within normal limits. No focal pulmonary opacity. No pleural effusion or pneumothorax. No acute osseous abnormality. IMPRESSION: No acute cardiopulmonary process. Electronically Signed   By: Wiliam Ke M.D.   On: 11/26/2022 10:59    Labs:  CBC: Recent Labs    11/03/22 1032 11/18/22 1025 11/26/22 1034 11/30/22  0940  WBC 4.3 3.6* 4.1 4.3  HGB 11.2* 10.6* 10.3* 10.4*  HCT 33.1* 31.4* 31.8* 31.4*  PLT 53* 60* 78* 66*    COAGS: Recent Labs    09/28/22 1453 10/01/22 0754 10/06/22 0735 10/07/22 1354 10/13/22 0814 11/26/22 1034  INR 1.9* 1.9* 1.8* 1.7* 1.6* 1.4*  APTT 37* 36  --  35  --   --     BMP: Recent Labs    11/03/22 1032 11/18/22 1025 11/26/22 1034 11/30/22 0940  NA 137 137 136 137  K 4.6 4.5 4.3 4.4  CL 106 105 103 105  CO2 23 25 24 25   GLUCOSE 107* 134* 117* 104*  BUN 15 14 15 13   CALCIUM 10.1 9.9 10.2 10.2  CREATININE 1.16* 0.99 1.08* 1.21*  GFRNONAA 52* >60 57* 50*    LIVER FUNCTION TESTS: Recent Labs    11/03/22 1032 11/18/22 1025 11/26/22 1034 11/30/22 0940  BILITOT 0.5 0.4 0.4 0.5  AST 87* 77* 94* 99*  ALT 27 24 27 27   ALKPHOS 99 109 102 133*  PROT 6.7 6.2* 6.4* 6.4*  ALBUMIN 4.2 4.0 3.7 3.8    TUMOR MARKERS: No results for input(s): "AFPTM", "CEA", "CA199", "CHROMGRNA" in the last 8760 hours.  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  We discussed treatment options including watchful waiting, surgical fixation, and percutaneous kyphoplasty/vertebroplasty With RF ablation.  We discussed in detail the percutaneous OsteoCool and kyphoplasty technique, anticipated benefits, time course to symptom resolution, possible risks and side effects.  We discussed his elevated risk of additional level fractures with or without vertebral augmentation.  We discussed the potential long-term need for continued bone building therapy managed by the patient's PCP.   She seemed to understand, and did ask appropriate questions.   Plan:  Thoracic T9 OsteoCool RF ablation and vertebral body augmentation with balloon kyphoplasty  Post-procedure disposition: outpatient DRI-A or as available  Medication holds: None required The patient has suffered a fracture of the Thoracic T9  vertebral body. It is recommended that patients aged 66 years or older be evaluated for possible testing or treatment of osteoporosis. A copy of this consult report is sent to the patient's referring physician.  Advanced Care Plan: The patient did not want to provide an Advanced Care Plan at the time of this visit     Total time spent on today's visit was over  40 Minutes , including both face-to-face time and non face-to-face time, personally spent on review of chart (including labs and relevant imaging), discussing further workup and treatment options, referral to specialist if needed, reviewing  outside records if pertinent, answering patient questions, and coordinating care regarding Painful pathologic T9 compression fracture as well as management strategy.    Thank you for this interesting consult.  I greatly enjoyed  meeting Kenney Houseman and look forward to participating in their care.  A copy of this report was sent to the requesting provider on this date.  Electronically Signed: Durwin Glaze 12/04/2022, 5:06 PM

## 2022-12-08 ENCOUNTER — Other Ambulatory Visit: Payer: Medicare HMO

## 2022-12-09 ENCOUNTER — Other Ambulatory Visit (HOSPITAL_COMMUNITY): Payer: Self-pay

## 2022-12-09 ENCOUNTER — Other Ambulatory Visit: Payer: Self-pay

## 2022-12-09 ENCOUNTER — Other Ambulatory Visit: Payer: Self-pay | Admitting: Hematology and Oncology

## 2022-12-09 MED ORDER — ABEMACICLIB 100 MG PO TABS
100.0000 mg | ORAL_TABLET | Freq: Two times a day (BID) | ORAL | 1 refills | Status: DC
  Filled 2022-12-09: qty 56, 28d supply, fill #0
  Filled 2023-01-04: qty 56, 28d supply, fill #1

## 2022-12-09 NOTE — Discharge Instructions (Signed)
Kyphoplasty Post Procedure Discharge Instructions  May resume a regular diet and any medications that you routinely take (including pain medications). However, if you are taking Aspirin or an anticoagulant/blood thinner you will be told when you can resume taking these by the healthcare provider. No driving day of procedure. The day of your procedure take it easy. You may use an ice pack as needed to injection sites on back.  Ice to back 30 minutes on and 30 minutes off, as needed. May remove bandaids tomorrow after taking a shower. Replace daily with a clean bandaid until healed.  Do not lift anything heavier than a milk jug for 1-2 weeks or determined by your physician.  Follow up with your physician in 2 weeks.    Please contact our office at [phone] for the following symptoms or if you have any questions:  Fever greater than 100 degrees Increased swelling, pain, or redness at injection site. Increased back and/or leg pain New numbness or change in symptoms from before the procedure.    Thank you for visiting [facility]. May resume aspiring immediately after procedure.

## 2022-12-10 ENCOUNTER — Ambulatory Visit
Admission: RE | Admit: 2022-12-10 | Discharge: 2022-12-10 | Disposition: A | Discharge status: Home / Self Care | Payer: Medicare HMO | Source: Ambulatory Visit | Attending: Hematology and Oncology | Admitting: Hematology and Oncology

## 2022-12-10 ENCOUNTER — Other Ambulatory Visit (HOSPITAL_COMMUNITY)
Admission: RE | Admit: 2022-12-10 | Discharge: 2022-12-10 | Disposition: A | Discharge status: Home / Self Care | Payer: Medicare HMO | Source: Ambulatory Visit | Attending: Hematology and Oncology | Admitting: Hematology and Oncology

## 2022-12-10 ENCOUNTER — Other Ambulatory Visit: Payer: Self-pay | Admitting: Adult Health

## 2022-12-10 VITALS — BP 134/71 | HR 75 | Temp 98.4°F | Resp 16

## 2022-12-10 DIAGNOSIS — C50311 Malignant neoplasm of lower-inner quadrant of right female breast: Secondary | ICD-10-CM | POA: Insufficient documentation

## 2022-12-10 DIAGNOSIS — Z170 Estrogen receptor positive status [ER+]: Secondary | ICD-10-CM | POA: Diagnosis present

## 2022-12-10 DIAGNOSIS — C419 Malignant neoplasm of bone and articular cartilage, unspecified: Secondary | ICD-10-CM

## 2022-12-10 DIAGNOSIS — S22070A Wedge compression fracture of T9-T10 vertebra, initial encounter for closed fracture: Secondary | ICD-10-CM

## 2022-12-10 DIAGNOSIS — N63 Unspecified lump in unspecified breast: Secondary | ICD-10-CM

## 2022-12-10 HISTORY — PX: IR BONE TUMOR(S)RF ABLATION: IMG2284

## 2022-12-10 HISTORY — PX: IR KYPHO THORACIC WITH BONE BIOPSY: IMG5518

## 2022-12-10 MED ORDER — MIDAZOLAM HCL 2 MG/2ML IJ SOLN
1.0000 mg | INTRAMUSCULAR | Status: DC | PRN
  Administered 2022-12-10 (×2): 1 mg via INTRAVENOUS

## 2022-12-10 MED ORDER — ACETAMINOPHEN 10 MG/ML IV SOLN
1000.0000 mg | Freq: Once | INTRAVENOUS | Status: AC
  Administered 2022-12-10: 1000 mg via INTRAVENOUS

## 2022-12-10 MED ORDER — CEFAZOLIN SODIUM-DEXTROSE 2-4 GM/100ML-% IV SOLN
2.0000 g | INTRAVENOUS | Status: AC
  Administered 2022-12-10: 2 g via INTRAVENOUS

## 2022-12-10 MED ORDER — FENTANYL CITRATE PF 50 MCG/ML IJ SOSY
25.0000 ug | PREFILLED_SYRINGE | INTRAMUSCULAR | Status: DC | PRN
  Administered 2022-12-10 (×2): 50 ug via INTRAVENOUS

## 2022-12-10 MED ORDER — SODIUM CHLORIDE 0.9 % IV SOLN: INTRAVENOUS | Status: DC

## 2022-12-10 NOTE — Progress Notes (Signed)
Pt back in nursing recovery area. Pt still drowsy from procedure but will wake up when spoken to. Pt follows commands, talks in complete sentences and has no complaints at this time. Pt will remain in nursing station until discharge.  ?

## 2022-12-14 ENCOUNTER — Other Ambulatory Visit: Payer: Self-pay | Admitting: Adult Health

## 2022-12-15 ENCOUNTER — Other Ambulatory Visit: Payer: Self-pay | Admitting: *Deleted

## 2022-12-15 DIAGNOSIS — C419 Malignant neoplasm of bone and articular cartilage, unspecified: Secondary | ICD-10-CM

## 2022-12-15 LAB — SURGICAL PATHOLOGY

## 2022-12-15 NOTE — Progress Notes (Signed)
Patient Care Team: Carylon Perches, MD as PCP - General (Internal Medicine) Almond Lint, MD as Consulting Physician (General Surgery) Lonie Peak, MD as Attending Physician (Radiation Oncology) Patton Salles, MD as Consulting Physician (Obstetrics and Gynecology) Venancio Poisson, MD as Consulting Physician (Dermatology) Crist Fat, MD as Consulting Physician (Urology) Charna Elizabeth, MD as Consulting Physician (Gastroenterology) Sheral Apley, MD as Attending Physician (Orthopedic Surgery) Serena Croissant, MD as Consulting Physician (Hematology and Oncology)  DIAGNOSIS: No diagnosis found.  SUMMARY OF ONCOLOGIC HISTORY: Oncology History  Malignant neoplasm of lower-inner quadrant of right breast of female, estrogen receptor positive  07/03/2019 Initial Diagnosis   right lower inner quadrant biopsy, for a clinicaly multiple T1c N0, stage IA invasive ductal carcinoma, grade 2, E-cadherin positive, strongly estrogen receptor positive but progesterone receptor negative and HER-2 not amplified, with an MIB-1 of 5%             (a) breast MRI 08/07/2019 showed 2 additional suspicious areas in the right breast and 1 in the left breast              (b) biopsy of all 3 areas 08/21/2019 showed no malignancy   07/26/2019 -  Anti-estrogen oral therapy   tamoxifen started neoadjuvantly 07/26/2019; to be continued for 10 years.  S/p TAH/BSO on 01/16/2020 with benign pathology   08/04/2019 Genetic Testing   Negative genetic testing:  No pathogenic variants detected on the [facility] Breast Cancer STAT panel or the Common Hereditary Cancers panel. The report date is 08/04/2019.  The STAT Breast cancer panel offered by [facility] includes sequencing and rearrangement analysis for the following 9 genes:  ATM, BRCA1, BRCA2, CDH1, CHEK2, PALB2, PTEN, STK11 and TP53.  The Common Hereditary Cancers Panel offered by [facility] includes sequencing and/or deletion duplication testing of the following 48  genes: APC, ATM, AXIN2, BARD1, BMPR1A, BRCA1, BRCA2, BRIP1, CDH1, CDK4, CDKN2A (p14ARF), CDKN2A (p16INK4a), CHEK2, CTNNA1, DICER1, EPCAM (Deletion/duplication testing only), GREM1 (promoter region deletion/duplication testing only), KIT, MEN1, MLH1, MSH2, MSH3, MSH6, MUTYH, NBN, NF1, NHTL1, PALB2, PDGFRA, PMS2, POLD1, POLE, PTEN, RAD50, RAD51C, RAD51D, RNF43, SDHB, SDHC, SDHD, SMAD4, SMARCA4. STK11, TP53, TSC1, TSC2, and VHL.  The following genes were evaluated for sequence changes only: SDHA and HOXB13 c.251G>A variant only.    10/04/2019 Cancer Staging   Staging form: Breast, AJCC 8th Edition - Pathologic stage from 10/04/2019: Stage IIB (pT2, pN1a(sn), cM0, G2, ER+, PR-, HER2-) - Signed by Loa Socks, NP on 10/18/2019   10/04/2019 Surgery    right lumpectomy and sentinel lymph node sampling 10/04/2019 showed a pT2 pN1, stage IIB invasive ductal carcinoma, grade 2, with positive lymphovascular invasion and a positive superior margin             (a) 3 sentinel lymph nodes removed, one with macro metastatic deposit, 1 with a micrometastatic deposit             (b) additional surgery for margin clearance   10/04/2019 Miscellaneous    MammaPrint on the 10/04/2019 sample shows a low risk luminal A tumor predicting a 5-year metastasis free survival of 96% without chemotherapy, and a chemotherapy benefit of less than 1.5%    11/27/2019 - 01/05/2020 Radiation Therapy    Site Technique Total Dose (Gy) Dose per Fx (Gy) Completed Fx Beam Energies  Breast, Right: Breast_Rt 3D 50/50 2 25/25 6X, 10X  Breast, Right: Breast_Rt_SCV_PAB 3D 50/50 2 25/25 6X, 10X  Breast, Right: Breast_Rt_Bst 3D 10/10 2 5/5 6X, 10X  09/29/2022 Imaging   IMPRESSION: 1. Surgical changes involving the right medial breast. No obvious recurrent breast mass or chest wall mass. 2. Mediastinal and hilar lymphadenopathy suggesting metastatic disease. 3. Patchy tree-in-bud type nodularity in the lungs suggesting chronic  inflammation or atypical infection such as MAC. No definite pulmonary metastatic nodules. 4. Large partially necrotic mass involving most of the right hepatic lobe and a 2.5 cm lesion in segment 3 of the liver consistent with metastatic disease. 5. 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   10/06/2022 Initial Biopsy   Liver biopsy: + for malignancy, metastatic poorly differentiated carcinoma, likely breast origin, biomarkers pending, insufficient tissue for molecular testing. (Resulted in EPIC on 10/13/2022)   10/07/2022 PET scan   PET scan on 10/07/2022 that showed hypermetabolic metastatic breast cancer with hypermetabolic mediastinal and hilar adenopathy, hypermetabolic hepatic metastatic disease and bone disease.  There were no findings for pulmonary metastatic disease.   10/21/2022 -  Anti-estrogen oral therapy   Verzinio and Letrozole     CHIEF COMPLIANT: Follow-up on letrozole/ Verzenio   INTERVAL HISTORY: Theresa [name] is a  67 y.o female with the above-mentioned Estrogen receptor positive breast cancer. Currently on letrozole. She presents to the clinic for a follow-up.    ALLERGIES:  is allergic to metronidazole, other, and tape.  MEDICATIONS:  Current Outpatient Medications  Medication Sig Dispense Refill   abemaciclib (VERZENIO) 100 MG tablet Take 1 tablet (100 mg total) by mouth 2 (two) times daily. 56 tablet 1   gabapentin (NEURONTIN) 300 MG capsule Take 1 capsule (300 mg total) by mouth at bedtime. 90 capsule 4   hydrocortisone 2.5 % cream Apply topically 3 (three) times daily as needed. (Patient not taking: Reported on 11/03/2022) 28 g 3   letrozole (FEMARA) 2.5 MG tablet TAKE 1 TABLET BY MOUTH EVERY DAY 90 tablet 1   loperamide (IMODIUM) 2 MG capsule Take 2 mg by mouth in the morning and at bedtime.      LORazepam (ATIVAN) 0.5 MG tablet Take 1 tablet (0.5 mg total) by mouth every 8 (eight) hours as needed for anxiety. (Patient not  taking: Reported on 12/04/2022) 15 tablet 0   methocarbamol (ROBAXIN) 500 MG tablet Take 500 mg by mouth 2 (two) times daily as needed for muscle spasms. (Patient not taking: Reported on 12/04/2022)     ondansetron (ZOFRAN) 8 MG tablet Take 1 tablet (8 mg total) by mouth every 8 (eight) hours as needed for nausea or vomiting. 30 tablet 2   oxyCODONE-acetaminophen (PERCOCET/ROXICET) 5-325 MG tablet Take 1 tablet by mouth every 6 (six) hours as needed for severe pain. 15 tablet 0   prochlorperazine (COMPAZINE) 10 MG tablet Take 1 tablet (10 mg total) by mouth every 6 (six) hours as needed for nausea or vomiting. 30 tablet 2   triamcinolone (KENALOG) 0.025 % ointment Apply 1 Application topically 2 (two) times daily. Use for one week at a time. (Patient not taking: Reported on 11/03/2022) 30 g 1   valsartan (DIOVAN) 40 MG tablet Take by mouth.     venlafaxine XR (EFFEXOR-XR) 37.5 MG 24 hr capsule Take 1 capsule (37.5 mg total) by mouth daily. 90 capsule 4   No current facility-administered medications for this visit.    PHYSICAL EXAMINATION: ECOG PERFORMANCE STATUS: {CHL ONC ECOG PS:[phone]}  There were no vitals filed for this visit. There were no vitals filed for this visit.  BREAST:*** No palpable masses or nodules in either right or  left breasts. No palpable axillary supraclavicular or infraclavicular adenopathy no breast tenderness or nipple discharge. (exam performed in the presence of a chaperone)  LABORATORY DATA:  I have reviewed the data as listed    Latest Ref Rng & Units 11/30/2022    9:40 AM 11/26/2022   10:34 AM 11/18/2022   10:25 AM  CMP  Glucose 70 - 99 mg/dL 161  096  045   BUN 8 - 23 mg/dL Creatinine 0.44 - 1.00 mg/dL 4.09  8.11  9.14   Sodium 135 - 145 mmol/L 137  136  137   Potassium 3.5 - 5.1 mmol/L 4.4  4.3  4.5   Chloride 98 - 111 mmol/L 105  103  105   CO2 22 - 32 mmol/L Calcium 8.9 - 10.3 mg/dL 78.2  95.6  9.9   Total Protein 6.5 - 8.1 g/dL  6.4  6.4  6.2   Total Bilirubin 0.3 - 1.2 mg/dL 0.5  0.4  0.4   Alkaline Phos 38 - 126 U/L 133  102  109   AST 15 - 41 U/L 99  94  77   ALT 0 - 44 U/L Lab Results  Component Value Date   WBC 4.3 11/30/2022   HGB 10.4 (L) 11/30/2022   HCT 31.4 (L) 11/30/2022   MCV 95.7 11/30/2022   PLT 66 (L) 11/30/2022   NEUTROABS 2.3 11/30/2022    ASSESSMENT & PLAN:  No problem-specific Assessment & Plan notes found for this encounter.    No orders of the defined types were placed in this encounter.  The patient has a good understanding of the overall plan. she agrees with it. she will call with any problems that may develop before the next visit here. Total time spent: 30 mins including face to face time and time spent for planning, charting and co-ordination of care   Sherlyn Lick, CMA 12/15/22    I Janan Ridge am acting as a Neurosurgeon for [employer]  ***

## 2022-12-16 ENCOUNTER — Inpatient Hospital Stay: Payer: Medicare HMO

## 2022-12-16 ENCOUNTER — Inpatient Hospital Stay (HOSPITAL_BASED_OUTPATIENT_CLINIC_OR_DEPARTMENT_OTHER): Payer: Medicare HMO | Admitting: Hematology and Oncology

## 2022-12-16 VITALS — BP 135/53 | HR 93 | Temp 97.3°F | Resp 18 | Ht 65.0 in | Wt 147.6 lb

## 2022-12-16 DIAGNOSIS — Z170 Estrogen receptor positive status [ER+]: Secondary | ICD-10-CM

## 2022-12-16 DIAGNOSIS — C50311 Malignant neoplasm of lower-inner quadrant of right female breast: Secondary | ICD-10-CM

## 2022-12-16 DIAGNOSIS — C419 Malignant neoplasm of bone and articular cartilage, unspecified: Secondary | ICD-10-CM

## 2022-12-16 LAB — CBC WITH DIFFERENTIAL (CANCER CENTER ONLY)
Abs Immature Granulocytes: 0.06 10*3/uL (ref 0.00–0.07)
Basophils Absolute: 0.1 10*3/uL (ref 0.0–0.1)
Basophils Relative: 1 %
Eosinophils Absolute: 0.1 10*3/uL (ref 0.0–0.5)
Eosinophils Relative: 2 %
HCT: 30.8 % — ABNORMAL LOW (ref 36.0–46.0)
Hemoglobin: 10.5 g/dL — ABNORMAL LOW (ref 12.0–15.0)
Immature Granulocytes: 1 %
Lymphocytes Relative: 31 %
Lymphs Abs: 1.8 10*3/uL (ref 0.7–4.0)
MCH: 32.8 pg (ref 26.0–34.0)
MCHC: 34.1 g/dL (ref 30.0–36.0)
MCV: 96.3 fL (ref 80.0–100.0)
Monocytes Absolute: 0.4 10*3/uL (ref 0.1–1.0)
Monocytes Relative: 8 %
Neutro Abs: 3.3 10*3/uL (ref 1.7–7.7)
Neutrophils Relative %: 57 %
Platelet Count: 67 10*3/uL — ABNORMAL LOW (ref 150–400)
RBC: 3.2 MIL/uL — ABNORMAL LOW (ref 3.87–5.11)
RDW: 19 % — ABNORMAL HIGH (ref 11.5–15.5)
WBC Count: 5.8 10*3/uL (ref 4.0–10.5)
nRBC: 1.9 % — ABNORMAL HIGH (ref 0.0–0.2)

## 2022-12-16 LAB — CMP (CANCER CENTER ONLY)
ALT: 33 U/L (ref 0–44)
AST: 137 U/L — ABNORMAL HIGH (ref 15–41)
Albumin: 3.9 g/dL (ref 3.5–5.0)
Alkaline Phosphatase: 186 U/L — ABNORMAL HIGH (ref 38–126)
Anion gap: 6 (ref 5–15)
BUN: 15 mg/dL (ref 8–23)
CO2: 26 mmol/L (ref 22–32)
Calcium: 11.4 mg/dL — ABNORMAL HIGH (ref 8.9–10.3)
Chloride: 101 mmol/L (ref 98–111)
Creatinine: 1.19 mg/dL — ABNORMAL HIGH (ref 0.44–1.00)
GFR, Estimated: 51 mL/min — ABNORMAL LOW (ref >60)
Glucose, Bld: 129 mg/dL — ABNORMAL HIGH (ref 70–99)
Potassium: 4.4 mmol/L (ref 3.5–5.1)
Sodium: 133 mmol/L — ABNORMAL LOW (ref 135–145)
Total Bilirubin: 0.6 mg/dL (ref 0.3–1.2)
Total Protein: 6.7 g/dL (ref 6.5–8.1)

## 2022-12-16 MED ORDER — DENOSUMAB 120 MG/1.7ML ~~LOC~~ SOLN
120.0000 mg | Freq: Once | SUBCUTANEOUS | Status: AC
  Administered 2022-12-16: 120 mg via SUBCUTANEOUS
  Filled 2022-12-16: qty 1.7

## 2022-12-16 MED ORDER — OXYCODONE-ACETAMINOPHEN 5-325 MG PO TABS: 1.0000 | ORAL_TABLET | Freq: Four times a day (QID) | ORAL | 0 refills | Status: DC | PRN

## 2022-12-16 NOTE — Assessment & Plan Note (Addendum)
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  PET CT scan 10/09/22: Hypermetabolic Met Breast cancer Mediastinal and Hilar LN and hepatic met disease and bone disease 4. Bone Marrow Biopsy: 10/13/22: Positive for met breast cancer: Caris requested  --------------------------------------------------------------------------------------------------------------------------- Verzinio toxicities: Mild intermittent diarrhea: Managing extremely well.  We will maintain her on 100 mg p.o. twice daily dosing. Mild nausea: Takes a nausea medication in the morning and that seems to be helping.   Lab review: 10/06/2022: Platelets 57, hemoglobin 10.6 11/26/2022: Platelets 78, hemoglobin 10.3 11/30/2022: Platelets 66, hemoglobin 10.4   Emergency room visit for back pain: CT of the back revealed T9 compression fracture, CT angiogram: No PE the mass in the liver appeared to be less conspicuous and the lymph nodes are stable rest of the bone metastases are stable  12/10/2022:Thoracic T9 OsteoCool RF ablation and vertebral body augmentation with balloon kyphoplasty  T9  vertebral body biopsy: Metastatic carcinoma, stains are pending: Request breast prognostic panel and Caris testing, Xgeva recommended  Patient was riding horses on wishes to continue to ride them.  Continued back pain: I will send a new prescription for Percocets.  I will refer the patient to Dr. Basilio Cairo to consider palliative radiation.  Bone metastases: She will also receive Xgeva today. Return to clinic in June for follow-up with Jonny Ruiz Our next scans will be done end of June 2024.

## 2022-12-16 NOTE — Patient Instructions (Signed)
Denosumab Injection (Oncology) What is this medication? DENOSUMAB (den oh SUE mab) prevents weakened bones caused by cancer. It may also be used to treat noncancerous bone tumors that cannot be removed by surgery. It can also be used to treat high calcium levels in the blood caused by cancer. It works by blocking a protein that causes bones to break down quickly. This slows down the release of calcium from bones, which lowers calcium levels in your blood. It also makes your bones stronger and less likely to break (fracture). This medicine may be used for other purposes; ask your health care provider or pharmacist if you have questions. COMMON BRAND NAME(S): XGEVA What should I tell my care team before I take this medication? They need to know if you have any of these conditions: Dental disease Having surgery or tooth extraction Infection Kidney disease Low levels of calcium or vitamin D in the blood Malnutrition On hemodialysis Skin conditions or sensitivity Thyroid or parathyroid disease An unusual reaction to denosumab, other medications, foods, dyes, or preservatives Pregnant or trying to get pregnant Breast-feeding How should I use this medication? This medication is for injection under the skin. It is given by your care team in a hospital or clinic setting. A special MedGuide will be given to you before each treatment. Be sure to read this information carefully each time. Talk to your care team about the use of this medication in children. While it may be prescribed for children as young as 13 years for selected conditions, precautions do apply. Overdosage: If you think you have taken too much of this medicine contact a poison control center or emergency room at once. NOTE: This medicine is only for you. Do not share this medicine with others. What if I miss a dose? Keep appointments for follow-up doses. It is important not to miss your dose. Call your care team if you are unable to  keep an appointment. What may interact with this medication? Do not take this medication with any of the following: Other medications containing denosumab This medication may also interact with the following: Medications that lower your chance of fighting infection Steroid medications, such as prednisone or cortisone This list may not describe all possible interactions. Give your health care provider a list of all the medicines, herbs, non-prescription drugs, or dietary supplements you use. Also tell them if you smoke, drink alcohol, or use illegal drugs. Some items may interact with your medicine. What should I watch for while using this medication? Your condition will be monitored carefully while you are receiving this medication. You may need blood work while taking this medication. This medication may increase your risk of getting an infection. Call your care team for advice if you get a fever, chills, sore throat, or other symptoms of a cold or flu. Do not treat yourself. Try to avoid being around people who are sick. You should make sure you get enough calcium and vitamin D while you are taking this medication, unless your care team tells you not to. Discuss the foods you eat and the vitamins you take with your care team. Some people who take this medication have severe bone, joint, or muscle pain. This medication may also increase your risk for jaw problems or a broken thigh bone. Tell your care team right away if you have severe pain in your jaw, bones, joints, or muscles. Tell your care team if you have any pain that does not go away or that gets worse. Talk   to your care team if you may be pregnant. Serious birth defects can occur if you take this medication during pregnancy and for 5 months after the last dose. You will need a negative pregnancy test before starting this medication. Contraception is recommended while taking this medication and for 5 months after the last dose. Your care team  can help you find the option that works for you. What side effects may I notice from receiving this medication? Side effects that you should report to your care team as soon as possible: Allergic reactions--skin rash, itching, hives, swelling of the face, lips, tongue, or throat Bone, joint, or muscle pain Low calcium level--muscle pain or cramps, confusion, tingling, or numbness in the hands or feet Osteonecrosis of the jaw--pain, swelling, or redness in the mouth, numbness of the jaw, poor healing after dental work, unusual discharge from the mouth, visible bones in the mouth Side effects that usually do not require medical attention (report to your care team if they continue or are bothersome): Cough Diarrhea Fatigue Headache Nausea This list may not describe all possible side effects. Call your doctor for medical advice about side effects. You may report side effects to FDA at 1-800-FDA-1088. Where should I keep my medication? This medication is given in a hospital or clinic. It will not be stored at home. NOTE: This sheet is a summary. It may not cover all possible information. If you have questions about this medicine, talk to your doctor, pharmacist, or health care provider.  2023 Elsevier/Gold Standard (2022-01-05 00:00:00)  

## 2022-12-17 NOTE — Progress Notes (Addendum)
Histology and Location of Primary Cancer: Malignant neoplasm of lower-inner quadrant of right breast of female, estrogen receptor positive   10-03-22 FINAL MICROSCOPIC DIAGNOSIS:  A. BREAST, RIGHT, LUMPECTOMY: -  Invasive ductal carcinoma, Nottingham grade 2 of 3, 2.8 cm -  Carcinoma extends to the inked superior margin -  Lymphovascular space invasion present -  Previous biopsy site changes present (x3) -  Fibrocystic changes with calcifications -  See oncology table and comment below  B. LYMPH NODE, RIGHT AXILLARY #1, SENTINEL, BIOPSY: -  Metastatic carcinoma involving one lymph node (1/1)  C. LYMPH NODE, RIGHT AXILLARY #2, SENTINEL, BIOPSY: -  Metastatic carcinoma involving one lymph node (1/1)  D. LYMPH NODE, RIGHT AXILLARY #3, SENTINEL, BIOPSY: -  No carcinoma identified in one lymph node (0/1)  COMMENT:  AMENDMENT NOTE: DIAGNOSTIC INFORMATION HAS NOT BEEN CHANGED. The original report had a typographical error in the diagnostic field.  This typographical error was brought to our attention by the breast center nurse navigator.  AMENDMENT NOTE 2:  DIAGNOSTIC INFORMATION HAS NOT BEEN CHANGED.  The original report indicated a discrepancy in the oncology table this has been corrected.  This information was discussed at tumor board on October 18, 2019.  ONCOLOGY TABLE:  INVASIVE CARCINOMA OF THE BREAST:  Resection  Procedure: Excision Specimen Laterality: Right Tumor Size: 2.8 cm Histologic Type: Invasive carcinoma of no special type (ductal, NOS) Histologic Grade:      Glandular (Acinar)/Tubular Differentiation: Score 3      Nuclear Pleomorphism: Score 2      Mitotic Rate: Score 1      Overall Grade: Nottingham grade 2 Ductal Carcinoma In Situ: Not identified Margins: Involved by invasive carcinoma, superior margin      Distance from closest margin (millimeters): Less than 1 mm      Specify closest margin (required only if <81mm): Medial Regional Lymph Nodes:       Number of Lymph Nodes Examined: 3      Number of Sentinel Nodes Examined (if applicable): 3      Number of Lymph Nodes with Macrometastases (>2 mm): 1      Number of Lymph Nodes with Micrometastases: 1      Number of Lymph Nodes with Isolated Tumor Cells (=0.2 mm or =200 cells): 0      Size of Largest Metastatic Deposit (millimeters): 4 mm      Extranodal Extension: Not identified Treatment Effect:  No known presurgical therapy Breast Biomarker Testing Performed on Previous Biopsy:       Testing Performed on Case Number: [id number]            Estrogen Receptor: 95%, positive, strong            Progesterone Receptor: Negative            HER2: Negative (FISH; group 5)            Ki-67: 5% Representative Tumor Block: A2 Pathologic Stage Classification (pTNM, AJCC 8th Edition): pT2, snpN1a Comment: E-cadherin immunohistochemistry was performed on the previous biopsy and was positive, supporting ductal origin. The anterior margin is positive (focally) in the resection specimen and carcinoma comes within 1mm of the inked medial margin.  ([id number])  10-13-22 Clinical History: Pancytopenia with H/O breast cancer recurrence. ([facility])   DIAGNOSIS:   BONE MARROW, ASPIRATE, CLOT, CORE:  -Hypercellular bone marrow with extensive involvement by metastatic  carcinoma  -See comment   PERIPHERAL BLOOD:  -Normocytic-normochromic anemia  -Thrombocytopenia   COMMENT:   The  bone marrow is extensively involved by metastatic carcinoma most  consistent with previously known breast primary.   MICROSCOPIC DESCRIPTION:   PERIPHERAL BLOOD SMEAR: The red blood cells display mild  anisopoikilocytosis with mild polychromasia.  Occasional circulating  nucleated red blood cells are seen on scan.  The white blood cells are  normal in number with scattered neutrophils displaying mild toxic  granulation.  The platelets are decreased in number.   BONE MARROW ASPIRATE: The aspirate material is hemodiluted  with lack of  bone marrow particles.   12-10-22 FINAL MICROSCOPIC DIAGNOSIS:   A. BONE, T9, BIOPSY:  - Metastatic carcinoma (see comment)   COMMENT:   Additional immunohistochemical stains will be performed for further  characterization and reported in an addendum.   Location(s) of Symptomatic tumor(s):  CT T-SPINE NO CHARGE 11-26-22 IMPRESSION: 1. Acute/subacute T9 vertebral compression fracture (30-40% height loss), new from the prior PET CT of 10/08/2022. 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 2. Mild L3 superior endplate vertebral compression, also new from the prior PET CT. Suggestion of irregular lucency within the partially imaged L3 vertebral body, highly suspicious for osseous metastatic disease. An MRI of the lumbar spine (without and with contrast) is recommended for further evaluation.   PET scan on 10-08-22 IMPRESSION: 1. Hypermetabolic metastatic breast cancer with hypermetabolic mediastinal and hilar adenopathy, hypermetabolic hepatic metastatic disease and bone disease. 2. No findings for pulmonary metastatic disease.    Past/Anticipated chemotherapy by medical oncology, if any:  Dr. Pamelia Hoit on 11-30-22 Oncology History  Malignant neoplasm of lower-inner quadrant of right breast of female, estrogen receptor positive  07/03/2019 Initial Diagnosis    right lower inner quadrant biopsy, for a clinicaly multiple T1c N0, stage IA invasive ductal carcinoma, grade 2, E-cadherin positive, strongly estrogen receptor positive but progesterone receptor negative and HER-2 not amplified, with an MIB-1 of 5%             (a) breast MRI 08/07/2019 showed 2 additional suspicious areas in the right breast and 1 in the left breast              (b) biopsy of all 3 areas 08/21/2019 showed no malignancy    07/26/2019 -  Anti-estrogen oral therapy    tamoxifen started neoadjuvantly 07/26/2019; to be continued for  10 years.  S/p TAH/BSO on 01/16/2020 with benign pathology    08/04/2019 Genetic Testing    Negative genetic testing:  No pathogenic variants detected on the [facility] Breast Cancer STAT panel or the Common Hereditary Cancers panel. The report date is 08/04/2019.   The STAT Breast cancer panel offered by [facility] includes sequencing and rearrangement analysis for the following 9 genes:  ATM, BRCA1, BRCA2, CDH1, CHEK2, PALB2, PTEN, STK11 and TP53.  The Common Hereditary Cancers Panel offered by [facility] includes sequencing and/or deletion duplication testing of the following 48 genes: APC, ATM, AXIN2, BARD1, BMPR1A, BRCA1, BRCA2, BRIP1, CDH1, CDK4, CDKN2A (p14ARF), CDKN2A (p16INK4a), CHEK2, CTNNA1, DICER1, EPCAM (Deletion/duplication testing only), GREM1 (promoter region deletion/duplication testing only), KIT, MEN1, MLH1, MSH2, MSH3, MSH6, MUTYH, NBN, NF1, NHTL1, PALB2, PDGFRA, PMS2, POLD1, POLE, PTEN, RAD50, RAD51C, RAD51D, RNF43, SDHB, SDHC, SDHD, SMAD4, SMARCA4. STK11, TP53, TSC1, TSC2, and VHL.  The following genes were evaluated for sequence changes only: SDHA and HOXB13 c.251G>A variant only.     10/04/2019 Cancer Staging    Staging form: Breast, AJCC 8th Edition - Pathologic stage from 10/04/2019: Stage IIB (pT2, pN1a(sn), cM0,  G2, ER+, PR-, HER2-) - Signed by Loa Socks, NP on 10/18/2019    10/04/2019 Surgery     right lumpectomy and sentinel lymph node sampling 10/04/2019 showed a pT2 pN1, stage IIB invasive ductal carcinoma, grade 2, with positive lymphovascular invasion and a positive superior margin             (a) 3 sentinel lymph nodes removed, one with macro metastatic deposit, 1 with a micrometastatic deposit             (b) additional surgery for margin clearance    10/04/2019 Miscellaneous     MammaPrint on the 10/04/2019 sample shows a low risk luminal A tumor predicting a 5-year metastasis free survival of 96% without chemotherapy, and a chemotherapy benefit of less than 1.5%      11/27/2019 - 01/05/2020 Radiation Therapy      Site Technique Total Dose (Gy) Dose per Fx (Gy) Completed Fx Beam Energies  Breast, Right: Breast_Rt 3D 50/50 2 25/25 6X, 10X  Breast, Right: Breast_Rt_SCV_PAB 3D 50/50 2 25/25 6X, 10X  Breast, Right: Breast_Rt_Bst 3D 10/10 2 5/5 6X, 10X    09/29/2022 Imaging    IMPRESSION: 1. Surgical changes involving the right medial breast. No obvious recurrent breast mass or chest wall mass. 2. Mediastinal and hilar lymphadenopathy suggesting metastatic disease. 3. Patchy tree-in-bud type nodularity in the lungs suggesting chronic inflammation or atypical infection such as MAC. No definite pulmonary metastatic nodules. 4. Large partially necrotic mass involving most of the right hepatic lobe and a 2.5 cm lesion in segment 3 of the liver consistent with metastatic disease. 5. 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    10/06/2022 Initial Biopsy    Liver biopsy: + for malignancy, metastatic poorly differentiated carcinoma, likely breast origin, biomarkers pending, insufficient tissue for molecular testing. (Resulted in EPIC on 10/13/2022)    10/07/2022 PET scan    PET scan on 10/07/2022 that showed hypermetabolic metastatic breast cancer with hypermetabolic mediastinal and hilar adenopathy, hypermetabolic hepatic metastatic disease and bone disease.  There were no findings for pulmonary metastatic disease.    10/21/2022 -  Anti-estrogen oral therapy    Verzinio and Letrozole   ASSESSMENT & PLAN:  Malignant neoplasm of lower-inner quadrant of right breast of female, estrogen receptor positive (HCC) 07/03/2019: Right breast LIQ T1CN0 stage Ia grade 2 IDC ER 95% PR negative HER2 negative Ki-67 5% 10/04/2019: Right lumpectomy: T2N1 stage IIb grade 2 IDC positive lymphovascular invasion 3 lymph nodes removed 1 with macro N1 micrometastatic disease 10/04/2019: MammaPrint: Low risk 01/05/2020: Completed adjuvant  radiation 08/04/2019: Genetics: Negative November 2020: Tamoxifen ------------------------------------------------------------------- 09/28/2022: Patient presented with platelets 65 INR 1.9 elevated LFTs and hypercalcemia  CT CAP 09/29/2022: Mediastinal and hilar lymphadenopathy suggestive of metastatic disease.  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  PET CT scan 10/09/22: Hypermetabolic Met Breast cancer Mediastinal and Hilar LN and hepatic met disease and bone disease 4. Bone Marrow Biopsy: 10/13/22: Positive for met breast cancer: Caris requested  --------------------------------------------------------------------------------------------------------------------------- Verzinio toxicities: Mild intermittent diarrhea: Managing extremely well.  We will maintain her on 100 mg p.o. twice daily dosing. Mild nausea: Takes a nausea medication in the morning and that seems to be helping.   Lab review: 10/06/2022:  Platelets 57, hemoglobin 10.6 11/26/2022: Platelets 78, hemoglobin 10.3 11/30/2022: Platelets 66, hemoglobin 10.4   Emergency room visit for back pain: CT of the back revealed T9 compression fracture, CT angiogram: No PE the mass in the liver appeared to be less conspicuous and the lymph nodes are stable rest of the bone metastases are stable I discussed the case with interventional radiology Dr. Archer Asa who thought that she would be a good candidate for osteocool (radiofrequency ablation and kyphoplasty) subsequently she could also get some radiation if there is malignant cells in there. We will try to get her into see them.   I would like to add Xgeva to her treatment plan starting on 12/16/2022.  Instructed to take calcium and vitamin D. It is  unclear if the fracture is related to metastatic breast cancer versus some degree of osteoporosis.  She likes to ride horses and we would like to have her continue to ride horses as this is the only thing that keeps her happy and at peace with everything going on with her health.  Patient's main complaints related to symptomatic tumor(s) are:  (Per Dr. Pamelia Hoit note on 11-30-22) She presents to the clinic for a follow-up. She reports that the muscle in her back is getting worst. It feels like a stabbing pain. She only feels it when she lay down. She says she is fine while sitting. She says riding horses is the only thing that keeps her mind off of things. She is taking oxycodone for pain. She having some shortness of breath with exertion  Pain on a scale of 0-10 is: 4/10 at T9 site of cement placement, she reports the oxycodone makes her feel bad and very sleepy, she only takes it when she needs to  If Spine Met(s), symptoms, if any, include: Bowel/Bladder retention or incontinence (please describe): controlled diarrhea Numbness or weakness in extremities (please describe): none Current Decadron regimen, if applicable: none  Ambulatory status? Walker? Wheelchair?: able to ambulate without difficulty  SAFETY ISSUES: Prior radiation? Yes, 2021 for breast cancer Pacemaker/ICD? no Possible current pregnancy? no Is the patient on methotrexate? no  Additional Complaints / other details: wants to know how bad and exactly where it is.

## 2022-12-18 ENCOUNTER — Telehealth: Payer: Self-pay

## 2022-12-18 ENCOUNTER — Encounter: Payer: Self-pay | Admitting: Radiation Oncology

## 2022-12-18 NOTE — Telephone Encounter (Signed)
Phone call to pt to follow up from her kyphoplasty on 12/10/22. Pt reports her pain is completely gone post procedure but is having some soreness on her right side. Pt reports she is able to move around a little better. Pt denies any signs of infection, redness at the site, draining or fever. Pt will be scheduled for a telephone follow up with Dr. Archer Asa next week. She was advised to call the day before her phone call appointment if she was still hurting, if so we will have her some in person. Pt advised to call back if anything were to change or any concerns arise and we will arrange an in person appointment. Pt verbalized understanding.

## 2022-12-18 NOTE — Telephone Encounter (Signed)
RN called pt to obtain information for rooming and consult note.Nurse evaluation portion complete for this pt prior to her consult with Dr. Basilio Cairo on Monday. She overall was doing well and has very minimal questions for Dr. Basilio Cairo. Rn explained plan for Monday including CT Simulation that will take place.

## 2022-12-18 NOTE — Progress Notes (Signed)
Phone call to pt to follow up from her kyphoplasty on 12/10/22. Pt reports her pain is completely gone post procedure but is having some right sided pain that she didn't have before. Pt reports she is able to move around a little better. Pt denies any signs of infection, redness at the site, draining or fever. Pt will be scheduled for a telephone follow up with Dr. Archer Asa next week. She is to call prior to her telephone call appt. If she is still having some pain, if so we will have her come in person. Pt advised to call back if anything were to change or any concerns arise and we will arrange an in person appointment. Pt verbalized understanding.

## 2022-12-20 NOTE — Progress Notes (Signed)
Radiation Oncology         ([phone]) [phone] ________________________________  Outpatient Re-Consultation  Name: Theresa [name] MRN: [id number]  Date: 12/21/2022  DOB: [birth year]  NW:GNFAO, Channing Mutters, MD  Serena Croissant, MD   REFERRING PHYSICIAN: Serena Croissant, MD  DIAGNOSIS:    ICD-10-CM   1. 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  Stage IIB (pT2, pN1a(sn), cM0) Right Breast, Invasive Ductal Carcinoma, ER+, PR-, HER2-, grade 2 : s/p lumpectomy and XRT   Cancer Staging  Malignant neoplasm of lower-inner quadrant of right breast of female, estrogen receptor positive Staging form: Breast, AJCC 8th Edition - Clinical stage from 07/26/2019: Stage IA (cT1c, cN0, cM0, G2, ER+, PR-, HER2-) - Unsigned Stage prefix: Initial diagnosis Histologic grading system: 3 grade system Laterality: Right Staged by: Pathologist and managing physician Stage used in treatment planning: Yes National guidelines used in treatment planning: Yes Type of national guideline used in treatment planning: NCCN - Pathologic stage from 10/04/2019: Stage IIB (pT2, pN1a(sn), cM0, G2, ER+, PR-, HER2-) - Signed by Loa Socks, NP on 10/18/2019 Stage prefix: Initial diagnosis Method of lymph node assessment: Sentinel lymph node biopsy Histologic grading system: 3 grade system   CHIEF COMPLAINT: Here to discuss management of metastatic breast cancer (to the liver and spine)  HISTORY OF PRESENT ILLNESS::Theresa [name] is a 67 y.o. female who presents today for consideration of palliative radiation therapy as a part of management for her recently diagnosed spinal metastases from a breast cancer primary. After completing radiation to the right breast in May of 2021, the patient continued to follow with medical oncology and continued on hormonal therapy with tamoxifen.   Earlier this year, the patient presented to [facility] (med-onc) on  09/28/22 with c/o bleeding and bruising easily for several weeks. Labs collected on that date were notable for a platelet count of 65, elevated INR of 1.9, mildly elevated LFTs, along with slight hypercalcemia. Based on her labs and symptoms, a CT AP was ordered to further evaluate increasing liver enzymes. She was also instructed to stop taking her tamoxifen at this time.   Subsequent CT AP on 09/19/22 showed: mediastinal and hilar lymphadenopathy suggestive of metastatic disease, a large partially necrotic mass involving most of the right hepatic lobe and a 2.5 cm lesion in segment 3 of the liver consistent with metastatic disease, and lytic metastatic bony disease involving the lumbar spine, pelvis and left scapula. CT also showed patchy tree-in-bud type nodularities in the lungs, likely reflecting chronic inflammation vs atypical infection such as MAC, as well a stable surgical changes in the right medial breast (without evidence of recurrent breast mass or chest wall mass).   Accordingly, the patient underwent US guided biopsies of the right hepatic lobe on 10/06/22. Pathology revealed findings consistent with metastatic poorly differentiated carcinoma (gata-3 positive), ER 30-40% positive, PR negative, Her2 negative.   PET scan performed on 10/07/22 showed hypermetabolic metastatic breast cancer with hypermetabolic mediastinal and hilar adenopathy, hypermetabolic hepatic metastatic disease, and bony disease. No findings suggestive of pulmonary metastatic disease were appreciated.   Bone marrow core biopsy collected on 10/13/22 showed extensive involvement of hypercellular bone marrow by metastatic carcinoma, consistent with a breast cancer primary. Peripheral blood submitted for pathology showed normocytic-normochromic anemia with thrombocytopenia.   The patient was promptly started on verzenio (with letrozole) on 10/21/22 per Dr. Pamelia Hoit. She has tolerated verzenio well thus  far other than mild  intermittent diarrhea (well managed with OTC medication), and mild nausea.   On 11/26/22, the patient presented to the ED for evaluation of progressive back pain over the course of 2 weeks, and some dyspnea. CTA of the chest performed showed a new compression at the T9 vertebral body and the known sclerotic and lytic bone lesions. CT also showed stable mediastinal and hilar lymph nodes consistent with the known history of neoplasm, and the previously demonstrated hepatic metastatic disease with fatty liver infiltration. No evidence of PE was demonstrated. CT of the thoracic spine also performed redemonstrated the new acute/subacute T9 vertebral compression fracture (resulting in 30-40% height loss). CT also revealed a new mild L3 superior endplate vertebral compression with possible irregular lucency within the partially imaged L3 vertebral body, highly suspicious for osseous metastatic disease. ED course included pain management, and she was discharged with referrals placed to neurosurgery and palliative care.   The patient opted to proceed with ablation and kyphoplasty to T9 on 12/10/22. Biopsy of T9 collected at the time of the procedure showed metastatic carcinoma, likely consistent with metastatic poorly differentiated carcinoma from a breast cancer primary.   Dr. Pamelia Hoit recommended adding xgeva to her treatment plan. She received her first dose of this on 12/16/22. Her current pain management regimen consists of percocet. Dr. Pamelia Hoit refilled this for her on 12/16/22.   While the patient had severe pain last week when she saw Dr. Pamelia Hoit, since she was referred to me, she reports that her pain has improved significantly.  The pain is located around T9 her spine.  She denies bony pain elsewhere.  She does report some queasiness and has not been taking her antiemetics regularly  PREVIOUS RADIATION THERAPY: Yes   Intent: Curative  Radiation Treatment Dates: 11/27/2019 through 01/05/2020 Site Technique  Total Dose (Gy) Dose per Fx (Gy) Completed Fx Beam Energies  Breast, Right: Breast_Rt 3D 50/50 2 25/25 6X, 10X  Breast, Right: Breast_Rt_SCV_PAB 3D 50/50 2 25/25 6X, 10X  Breast, Right: Breast_Rt_Bst 3D 10/10 2 5/5 6X, 10X     PAST MEDICAL HISTORY:  has a past medical history of Breast cancer, right, Contact lens/glasses fitting, Diarrhea, Family history of bone cancer, Family history of brain cancer, Family history of stomach cancer, Family history of uterine cancer, Fibroids, History of radiation therapy, Hypertension, No pertinent past medical history, Personal history of radiation therapy, PONV (postoperative nausea and vomiting), Right ovarian cyst, and Vertigo.    PAST SURGICAL HISTORY: Past Surgical History:  Procedure Laterality Date   BLADDER SUSPENSION     BREAST BIOPSY  09/08/2012   Procedure: BREAST BIOPSY;  Surgeon: Adolph Pollack, MD;  Location: [facility];  Service: General;  Laterality: Left;  remove left breast mass   BREAST LUMPECTOMY Right 10/2019   BREAST LUMPECTOMY WITH RADIOACTIVE SEED AND SENTINEL LYMPH NODE BIOPSY Right 10/04/2019   Procedure: RIGHT BREAST LUMPECTOMY WITH BRACKETED RADIOACTIVE SEEDS, RIGHT BREAST RADIOACTIVE SEED GUIDED EXCISION BIOPSY, AND RIGHT SENTINEL LYMPH NODE BIOPSY;  Surgeon: Almond Lint, MD;  Location: [facility];  Service: General;  Laterality: Right;   BREAST SURGERY     lumpectomy x2   BUNIONECTOMY Right 01/2016   CHOLECYSTECTOMY     COLONOSCOPY     CYSTOSCOPY N/A 01/16/2020   Procedure: CYSTOSCOPY;  Surgeon: Patton Salles, MD;  Location: [facility];  Service: Gynecology;  Laterality: N/A;   IR BONE TUMOR(S)RF ABLATION  12/10/2022   IR KYPHO  THORACIC WITH BONE BIOPSY  12/10/2022   MASTOPEXY Right 10/04/2019   Procedure: RIGHT BREAST MASTOPEXY;  Surgeon: Almond Lint, MD;  Location: [facility];  Service: General;  Laterality: Right;   RE-EXCISION OF BREAST LUMPECTOMY  Right 10/31/2019   Procedure: RIGHT RE-EXCISION OF BREAST LUMPECTOMY;  Surgeon: Almond Lint, MD;  Location: [facility];  Service: General;  Laterality: Right;   TOTAL LAPAROSCOPIC HYSTERECTOMY WITH BILATERAL SALPINGO OOPHORECTOMY N/A 01/16/2020   Procedure: TOTAL LAPAROSCOPIC HYSTERECTOMY WITH BILATERAL SALPINGO OOPHORECTOMY/COLLECTION OF PELVIC WASHINGS, LYSIS OF ADHESIONS;  Surgeon: Patton Salles, MD;  Location: [facility];  Service: Gynecology;  Laterality: N/A;  collection of pelvic washings   TUBAL LIGATION      FAMILY HISTORY: family history includes Bone cancer (age of onset: 19) in her paternal grandfather; Brain cancer (age of onset: 92) in her maternal aunt; Cancer in her maternal grandmother; Dementia in her mother; Depression in her mother; Heart disease in her sister; Hypertension in her father; Stomach cancer in her father; Thyroid disease in her sister; Uterine cancer in her cousin; Uterine cancer (age of onset: 45) in her cousin; Uterine cancer (age of onset: 73) in her paternal aunt.  SOCIAL HISTORY:  reports that she quit smoking about 26 years ago. Her smoking use included cigarettes. She has never used smokeless tobacco. She reports that she does not currently use alcohol. She reports that she does not use drugs.  ALLERGIES: Metronidazole, Other, and Tape  MEDICATIONS:  Current Outpatient Medications  Medication Sig Dispense Refill   abemaciclib (VERZENIO) 100 MG tablet Take 1 tablet (100 mg total) by mouth 2 (two) times daily. 56 tablet 1   gabapentin (NEURONTIN) 300 MG capsule Take 1 capsule (300 mg total) by mouth at bedtime. 90 capsule 4   letrozole (FEMARA) 2.5 MG tablet TAKE 1 TABLET BY MOUTH EVERY DAY 90 tablet 1   loperamide (IMODIUM) 2 MG capsule Take 2 mg by mouth in the morning and at bedtime.      LORazepam (ATIVAN) 0.5 MG tablet TAKE 1 TABLET BY MOUTH EVERY 8 HOURS AS NEEDED FOR ANXIETY. 15 tablet 0   ondansetron (ZOFRAN)  8 MG tablet Take 1 tablet (8 mg total) by mouth every 8 (eight) hours as needed for nausea or vomiting. 30 tablet 2   oxyCODONE-acetaminophen (PERCOCET/ROXICET) 5-325 MG tablet Take 1 tablet by mouth every 6 (six) hours as needed for severe pain. 30 tablet 0   prochlorperazine (COMPAZINE) 10 MG tablet Take 1 tablet (10 mg total) by mouth every 6 (six) hours as needed for nausea or vomiting. 30 tablet 2   valsartan (DIOVAN) 40 MG tablet Take by mouth.     venlafaxine XR (EFFEXOR-XR) 37.5 MG 24 hr capsule Take 1 capsule (37.5 mg total) by mouth daily. 90 capsule 4   hydrocortisone 2.5 % cream Apply topically 3 (three) times daily as needed. (Patient not taking: Reported on 11/03/2022) 28 g 3   methocarbamol (ROBAXIN) 500 MG tablet Take 500 mg by mouth 2 (two) times daily as needed for muscle spasms. (Patient not taking: Reported on 12/04/2022)     triamcinolone (KENALOG) 0.025 % ointment Apply 1 Application topically 2 (two) times daily. Use for one week at a time. (Patient not taking: Reported on 11/03/2022) 30 g 1   No current facility-administered medications for this encounter.    REVIEW OF SYSTEMS:  Notable for that above.   PHYSICAL EXAM:  vitals were not taken for this visit.  General: Alert and oriented, in no acute distress  HEENT: Head is normocephalic. Extraocular movements are intact.  Extremities: No cyanosis or edema. Skin: No concerning lesions. Musculoskeletal: Well nourished.  Ambulatory.  No tenderness to palpation in the thoracic or lumbar spine Neurologic: Cranial nerves II through XII are grossly intact. No obvious focalities. Speech is fluent. Coordination is intact. Psychiatric: Judgment and insight are intact. Affect is appropriate.  KPS = 90  100 - Normal; no complaints; no evidence of disease. 90   - Able to carry on normal activity; minor signs or symptoms of disease. 80   - Normal activity with effort; some signs or symptoms of disease. 58   - Cares for self; unable  to carry on normal activity or to do active work. 60   - Requires occasional assistance, but is able to care for most of his personal needs. 50   - Requires considerable assistance and frequent medical care. 40   - Disabled; requires special care and assistance. 30   - Severely disabled; hospital admission is indicated although death not imminent. 20   - Very sick; hospital admission necessary; active supportive treatment necessary. 10   - Moribund; fatal processes progressing rapidly. 0     - Dead  Karnofsky DA, Abelmann WH, Craver LS and Burchenal [facility] [phone]) The use of the nitrogen mustards in the palliative treatment of carcinoma: with particular reference to bronchogenic carcinoma Cancer 1 634-56  ECOG = 1  0 - Asymptomatic (Fully active, able to carry on all predisease activities without restriction)  1 - Symptomatic but completely ambulatory (Restricted in physically strenuous activity but ambulatory and able to carry out work of a light or sedentary nature. For example, light housework, office work)  2 - Symptomatic, <50% in bed during the day (Ambulatory and capable of all self care but unable to carry out any work activities. Up and about more than 50% of waking hours)  3 - Symptomatic, >50% in bed, but not bedbound (Capable of only limited self-care, confined to bed or chair 50% or more of waking hours)  4 - Bedbound (Completely disabled. Cannot carry on any self-care. Totally confined to bed or chair)  5 - Death   Santiago Glad MM, Creech RH, Tormey DC, et al. [phone]). "Toxicity and response criteria of the [facility]". Am. Evlyn Clines. Oncol. 5 (6): 649-55   LABORATORY DATA:  Lab Results  Component Value Date   WBC 5.8 12/16/2022   HGB 10.5 (L) 12/16/2022   HCT 30.8 (L) 12/16/2022   MCV 96.3 12/16/2022   PLT 67 (L) 12/16/2022   CMP     Component Value Date/Time   NA 133 (L) 12/16/2022 1101   K 4.4 12/16/2022 1101   CL 101 12/16/2022 1101   CO2 26  12/16/2022 1101   GLUCOSE 129 (H) 12/16/2022 1101   BUN 15 12/16/2022 1101   CREATININE 1.19 (H) 12/16/2022 1101   CALCIUM 11.4 (H) 12/16/2022 1101   PROT 6.7 12/16/2022 1101   ALBUMIN 3.9 12/16/2022 1101   AST 137 (H) 12/16/2022 1101   ALT 33 12/16/2022 1101   ALKPHOS 186 (H) 12/16/2022 1101   BILITOT 0.6 12/16/2022 1101   GFRNONAA 51 (L) 12/16/2022 1101   GFRAA >60 03/08/2020 1001         RADIOGRAPHY: IR KYPHO THORACIC WITH BONE BIOPSY  Result Date: 12/10/2022 CLINICAL DATA:  67 year old female with a history of breast cancer and new T9 compression fracture concerning for possible pathologic fracture.  She presents today for concurrent transpedicular bone biopsy, radiofrequency ablation and cement augmentation with balloon kyphoplasty. EXAM: FLUOROSCOPIC GUIDED T9 VERTEBRAL BODY BIOPSY RF ABLATION AND KYPHOPLASTY/CEMENT AUGMENTATION. COMPARISON:  None Available. MEDICATIONS: Ancef 2 g IV; 1 g Tylenol IV the antibiotic was administered in an appropriate time interval prior to needle puncture of the skin. ANESTHESIA/SEDATION: Versed 2 mg IV; Fentanyl 100 mcg IV Moderate Sedation Time: 40 minutes; The patient was continuously monitored during the procedure by the interventional radiology nurse under my direct supervision. FLUOROSCOPY TIME:  Radiation exposure index: 29 mGy reference air kerma COMPLICATIONS: None immediate. TECHNIQUE: Informed written consent was obtained from the patient after a thorough discussion of the procedural risks, benefits and alternatives. All questions were addressed. Maximal Sterile Barrier Technique was utilized including caps, mask, sterile gowns, sterile gloves, sterile drape, hand hygiene and skin antiseptic. A timeout was performed prior to the initiation of the procedure. The patient was placed prone on the fluoroscopic table. The skin overlying the upper thoracic region was then prepped and draped in the usual sterile fashion. Maximal barrier sterile technique was  utilized including caps, mask, sterile gowns, sterile gloves, sterile drape, hand hygiene and skin antiseptic. Intravenous Fentanyl and Versed were administered as conscious sedation during continuous cardiorespiratory monitoring by the radiology RN. The left pedicle at T9 was then infiltrated with 1% lidocaine followed by the advancement of a Kyphon trocar needle through the left pedicle into the posterior one-third of the vertebral body. A biopsy specimen was then obtained. Subsequently, the osteo drill was advanced to the anterior third of the vertebral body. The osteo drill was retracted. Through the working cannula, a 10 mm OsteoCool RF ablation probe was inserted and positioned under fluoroscopic guidance. In similar fashion, the right T9 pedicle was infiltrated with 1% lidocaine. Utilizing a extra pedicular approach, a second Kyphon trocar needle was advanced into the posterior third of the vertebral body. A biopsy specimen was then obtained. Subsequently, the osteo drill was coaxially advanced to the anterior right third. The osteo drill was exchanged for a 10 mm OsteoCool RF ablation probe which was positioned under fluoroscopic guidance. With both OsteoCool ablation probes in place, the ablation was performed for 7.5 minutes. Attention was now paid towards the kyphoplasty portion of the procedure. Beginning at the T9 vertebral body level, a Kyphon inflatable bone tamp 15 x 2.5 was advanced through both working cannulas and positioned with the distal marker approximately 5 mm from the anterior aspect of the cortex. Appropriate positioning was confirmed on the AP projection. At this time, the balloon was expanded using contrast via a Kyphon inflation syringe device via micro tubing. Inflations were continued under direct fluoroscopic guidance. At this time, methylmethacrylate mixture was reconstituted in the Kyphon bone mixing device system. This was then loaded into the delivery mechanism, attached to  Kyphon bone fillers. The balloons were deflated and removed followed by the instillation of methylmethacrylate mixture with excellent filling in the AP and lateral projections. The working cannulae and the bone filler were then retrieved and removed. Multiple spot radiographic images were obtained in various obliquities. Hemostasis was achieved with manual compression. The patient tolerated the procedure well without immediate postprocedural complication. FINDINGS: Completion images demonstrate a technically excellent result with adequate cement filling of the T9 vertebral bodies on both the AP and lateral projections. No extravasation was noted in the disk spaces or posteriorly into the spinal canal. No epidural venous contamination was seen. IMPRESSION: Technically successful T9 vertebral body biopsy, ablation and  cement augmentation using balloon kyphoplasty. PLAN: The patient will be seen for clinical follow-up at the interventional radiology clinic in 2-4 weeks. Signed, Sterling Big, MD [facility] Radiology Electronically Signed   By: Malachy Moan M.D.   On: 12/10/2022 10:54   IR Bone Tumor(s)RF Ablation  Result Date: 12/10/2022 CLINICAL DATA:  67 year old female with a history of breast cancer and new T9 compression fracture concerning for possible pathologic fracture. She presents today for concurrent transpedicular bone biopsy, radiofrequency ablation and cement augmentation with balloon kyphoplasty. EXAM: FLUOROSCOPIC GUIDED T9 VERTEBRAL BODY BIOPSY RF ABLATION AND KYPHOPLASTY/CEMENT AUGMENTATION. COMPARISON:  None Available. MEDICATIONS: Ancef 2 g IV; 1 g Tylenol IV the antibiotic was administered in an appropriate time interval prior to needle puncture of the skin. ANESTHESIA/SEDATION: Versed 2 mg IV; Fentanyl 100 mcg IV Moderate Sedation Time: 40 minutes; The patient was continuously monitored during the procedure by the interventional  radiology nurse under my direct supervision. FLUOROSCOPY TIME:  Radiation exposure index: 29 mGy reference air kerma COMPLICATIONS: None immediate. TECHNIQUE: Informed written consent was obtained from the patient after a thorough discussion of the procedural risks, benefits and alternatives. All questions were addressed. Maximal Sterile Barrier Technique was utilized including caps, mask, sterile gowns, sterile gloves, sterile drape, hand hygiene and skin antiseptic. A timeout was performed prior to the initiation of the procedure. The patient was placed prone on the fluoroscopic table. The skin overlying the upper thoracic region was then prepped and draped in the usual sterile fashion. Maximal barrier sterile technique was utilized including caps, mask, sterile gowns, sterile gloves, sterile drape, hand hygiene and skin antiseptic. Intravenous Fentanyl and Versed were administered as conscious sedation during continuous cardiorespiratory monitoring by the radiology RN. The left pedicle at T9 was then infiltrated with 1% lidocaine followed by the advancement of a Kyphon trocar needle through the left pedicle into the posterior one-third of the vertebral body. A biopsy specimen was then obtained. Subsequently, the osteo drill was advanced to the anterior third of the vertebral body. The osteo drill was retracted. Through the working cannula, a 10 mm OsteoCool RF ablation probe was inserted and positioned under fluoroscopic guidance. In similar fashion, the right T9 pedicle was infiltrated with 1% lidocaine. Utilizing a extra pedicular approach, a second Kyphon trocar needle was advanced into the posterior third of the vertebral body. A biopsy specimen was then obtained. Subsequently, the osteo drill was coaxially advanced to the anterior right third. The osteo drill was exchanged for a 10 mm OsteoCool RF ablation probe which was positioned under fluoroscopic guidance. With both OsteoCool ablation probes in place,  the ablation was performed for 7.5 minutes. Attention was now paid towards the kyphoplasty portion of the procedure. Beginning at the T9 vertebral body level, a Kyphon inflatable bone tamp 15 x 2.5 was advanced through both working cannulas and positioned with the distal marker approximately 5 mm from the anterior aspect of the cortex. Appropriate positioning was confirmed on the AP projection. At this time, the balloon was expanded using contrast via a Kyphon inflation syringe device via micro tubing. Inflations were continued under direct fluoroscopic guidance. At this time, methylmethacrylate mixture was reconstituted in the Kyphon bone mixing device system. This was then loaded into the delivery mechanism, attached to Kyphon bone fillers. The balloons were deflated and removed followed by the instillation of methylmethacrylate mixture with excellent filling in the AP and lateral projections. The working cannulae and the bone filler were then retrieved and  removed. Multiple spot radiographic images were obtained in various obliquities. Hemostasis was achieved with manual compression. The patient tolerated the procedure well without immediate postprocedural complication. FINDINGS: Completion images demonstrate a technically excellent result with adequate cement filling of the T9 vertebral bodies on both the AP and lateral projections. No extravasation was noted in the disk spaces or posteriorly into the spinal canal. No epidural venous contamination was seen. IMPRESSION: Technically successful T9 vertebral body biopsy, ablation and cement augmentation using balloon kyphoplasty. PLAN: The patient will be seen for clinical follow-up at the interventional radiology clinic in 2-4 weeks. Signed, Sterling Big, MD [facility] Radiology Electronically Signed   By: Malachy Moan M.D.   On: 12/10/2022 10:54   CT Angio Chest PE W and/or Wo Contrast  Result Date:  11/26/2022 CLINICAL DATA:  Shortness of breath. Back pain for several weeks. History of breast cancer * Tracking Code: BO * EXAM: CT ANGIOGRAPHY CHEST WITH CONTRAST TECHNIQUE: Multidetector CT imaging of the chest was performed using the standard protocol during bolus administration of intravenous contrast. Multiplanar CT image reconstructions and MIPs were obtained to evaluate the vascular anatomy. RADIATION DOSE REDUCTION: This exam was performed according to the departmental dose-optimization program which includes automated exposure control, adjustment of the mA and/or kV according to patient size and/or use of iterative reconstruction technique. CONTRAST:  75mL OMNIPAQUE IOHEXOL 350 MG/ML SOLN COMPARISON:  CT chest abdomen pelvis 09/29/2022 FINDINGS: Cardiovascular: Heart is nonenlarged. No pericardial effusion. Normal caliber thoracic aorta. No segmental or larger pulmonary embolism identified. Mediastinum/Nodes: No abnormal lymph node enlargement identified in the axillary regions. Normal caliber thoracic esophagus. Mediastinal nodes and hilar nodes are again seen. The subcarinal node which previously measured 18 mm in short axis, today on series 4 image 59 measures 16 x 24 mm. Right paratracheal node which previously measured 7 mm in short axis, today on series 4, image 44 measures 7 x 11 mm. Right hilar node which previously measured 13 mm in transverse dimension, today measures 11 mm. Other nodes are similar as well. Lungs/Pleura: Breathing motion seen. There is some dependent areas of ground-glass in both lungs, favoring atelectasis. No consolidation, pneumothorax or effusion. The areas of tree-in-bud nodularity are stable. Calcified nodule left upper lobe on series 6, image 40 as well. Upper Abdomen: Fatty liver infiltration. The liver appears enlarged. Large right-sided liver mass is again identified but less well defined with the contrast bolus. Smaller left hepatic mass is also seen at the edge of the  imaging field. The adrenal glands are preserved. Musculoskeletal: Degenerative changes of the spine. There is some compression deformity of the T9 level with lucent sclerotic bone changes. Mother with a bone lesions are seen. The compression is new from prior exam. Please see separate dictation of thoracic spine CT. Review of the MIP images confirms the above findings. IMPRESSION: No pulmonary embolism identified.  Mild breathing motion. Stable mediastinal and hilar lymph nodes consistent with known history of neoplasm. Sclerotic and lytic bone lesions are identified. There is new compression at T9 vertebral body. Pathologic compression injury is possible. Please correlate with separate thoracic spine CT Fatty liver infiltration. Liver masses are again noted but less well defined with contrast bolus and field-of-view. Electronically Signed   By: Karen Kays M.D.   On: 11/26/2022 12:51   CT T-SPINE NO CHARGE  Result Date: 11/26/2022 CLINICAL DATA:  Provided history: Metastatic disease evaluation with worsened back pain. Shortness of breath with thoracic back pain. Cough.  History of breast cancer. EXAM: CT THORACIC SPINE WITHOUT CONTRAST TECHNIQUE: Multidetector CT images of the thoracic were obtained using the standard protocol without intravenous contrast. RADIATION DOSE REDUCTION: This exam was performed according to the departmental dose-optimization program which includes automated exposure control, adjustment of the mA and/or kV according to patient size and/or use of iterative reconstruction technique. COMPARISON:  CT chest/abdomen/pelvis 09/29/2022.  PET CT 09/07/2022. FINDINGS: Alignment: No significant spondylolisthesis. No significant bony retropulsion. Vertebrae: Acute/subacute T9 vertebral compression fracture (30-40% height loss), new from the prior head CT of 10/08/2022. No significant bony retropulsion at this level. No compression fracture or suspicious osseous lesion elsewhere within the  thoracic spine. At the periphery of the field of view, there is a mild L3 superior endplate vertebral compression which is also new from the prior PET CT. Suggestion of irregular lucency within the imaged portions of the L3 vertebral body highly suspicious for osseous metastatic disease. Paraspinal and other soft tissues: Intrathoracic soft tissue findings reported on concurrently performed chest CT. No paraspinal mass or collection. Disc levels: Multilevel disc space narrowing, greatest at T8-T9 (moderate at this level). No appreciable significant spinal canal stenosis. No significant bony neural foraminal narrowing. IMPRESSION: 1. Acute/subacute T9 vertebral compression fracture (30-40% height loss), new from the prior PET CT of 10/08/2022. 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 2. Mild L3 superior endplate vertebral compression, also new from the prior PET CT. Suggestion of irregular lucency within the partially imaged L3 vertebral body, highly suspicious for osseous metastatic disease. An MRI of the lumbar spine (without and with contrast) is recommended for further evaluation. Electronically Signed   By: Jackey Loge D.O.   On: 11/26/2022 12:50   DG Chest 2 View  Result Date: 11/26/2022 CLINICAL DATA:  Shortness of breath and back pain EXAM: CHEST - 2 VIEW COMPARISON:  No prior chest radiograph available, correlation is made with CT chest abdomen pelvis 09/29/2022 FINDINGS: Cardiac and mediastinal contours are within normal limits. No focal pulmonary opacity. No pleural effusion or pneumothorax. No acute osseous abnormality. IMPRESSION: No acute cardiopulmonary process. Electronically Signed   By: Wiliam Ke M.D.   On: 11/26/2022 10:59      IMPRESSION/PLAN: Bone metastases - pain at T9   She is status post OsteoCool RF ablation and kyphoplasty at T9 earlier this month.  Due to severe pain a week after the procedure she was  referred to me for consideration of palliative radiation therapy to further help with pain.  She reports that her pain is now improving.  We discussed the risks benefits and side effects of radiation therapy to the spine.  Consent form was signed today in case she proceeds with this treatment later on.  But at this time, she and I agree that it would be reasonable for her to  hold off on radiation and see if her pain continues to improve.  I gave her my contact information in case she feels that her pain control is unacceptable and is interested in proceeding with radiation later.  She would also like to see her interventional radiologist for follow-up before she makes a decision. I will see her back as needed.  For her queasiness, I reviewed her medication list and let her know what medication can help with nausea (ondansetron).   On date of service, in total, I spent 40 minutes on this encounter. Patient was seen in person.   __________________________________________   Maralyn Sago  Basilio Cairo, MD  This document serves as a record of services personally performed by Lonie Peak, MD. It was created on her behalf by Neena Rhymes, a trained medical scribe. The creation of this record is based on the scribe's personal observations and the provider's statements to them. This document has been checked and approved by the attending provider.

## 2022-12-21 ENCOUNTER — Other Ambulatory Visit: Payer: Self-pay

## 2022-12-21 ENCOUNTER — Encounter: Payer: Self-pay | Admitting: Radiation Oncology

## 2022-12-21 ENCOUNTER — Ambulatory Visit
Admission: RE | Admit: 2022-12-21 | Discharge: 2022-12-21 | Disposition: A | Discharge status: Home / Self Care | Payer: Medicare HMO | Source: Ambulatory Visit | Attending: Radiation Oncology | Admitting: Radiation Oncology

## 2022-12-21 ENCOUNTER — Other Ambulatory Visit: Payer: Self-pay | Admitting: Interventional Radiology

## 2022-12-21 ENCOUNTER — Ambulatory Visit: Payer: Medicare HMO | Admitting: Radiation Oncology

## 2022-12-21 VITALS — BP 139/68 | HR 90 | Temp 96.5°F | Resp 18 | Ht 65.0 in | Wt 144.0 lb

## 2022-12-21 DIAGNOSIS — Z79899 Other long term (current) drug therapy: Secondary | ICD-10-CM | POA: Diagnosis not present

## 2022-12-21 DIAGNOSIS — S22070A Wedge compression fracture of T9-T10 vertebra, initial encounter for closed fracture: Secondary | ICD-10-CM

## 2022-12-21 DIAGNOSIS — Z79811 Long term (current) use of aromatase inhibitors: Secondary | ICD-10-CM | POA: Diagnosis not present

## 2022-12-21 DIAGNOSIS — C7951 Secondary malignant neoplasm of bone: Secondary | ICD-10-CM | POA: Insufficient documentation

## 2022-12-21 DIAGNOSIS — R110 Nausea: Secondary | ICD-10-CM | POA: Insufficient documentation

## 2022-12-21 DIAGNOSIS — Z800 Family history of malignant neoplasm of digestive organs: Secondary | ICD-10-CM | POA: Diagnosis not present

## 2022-12-21 DIAGNOSIS — C50311 Malignant neoplasm of lower-inner quadrant of right female breast: Secondary | ICD-10-CM | POA: Diagnosis not present

## 2022-12-21 DIAGNOSIS — Z170 Estrogen receptor positive status [ER+]: Secondary | ICD-10-CM | POA: Diagnosis not present

## 2022-12-21 DIAGNOSIS — N83201 Unspecified ovarian cyst, right side: Secondary | ICD-10-CM | POA: Diagnosis not present

## 2022-12-21 DIAGNOSIS — Z87891 Personal history of nicotine dependence: Secondary | ICD-10-CM | POA: Diagnosis not present

## 2022-12-21 DIAGNOSIS — Z923 Personal history of irradiation: Secondary | ICD-10-CM | POA: Insufficient documentation

## 2022-12-21 DIAGNOSIS — Z808 Family history of malignant neoplasm of other organs or systems: Secondary | ICD-10-CM | POA: Insufficient documentation

## 2022-12-21 DIAGNOSIS — I10 Essential (primary) hypertension: Secondary | ICD-10-CM | POA: Diagnosis not present

## 2022-12-21 DIAGNOSIS — R197 Diarrhea, unspecified: Secondary | ICD-10-CM | POA: Insufficient documentation

## 2022-12-22 ENCOUNTER — Telehealth: Payer: Self-pay

## 2022-12-22 NOTE — Telephone Encounter (Signed)
Rn called pt to update her on conversations that Dr. Archer Asa and Dr.Squire had concerning her radiation treatment. Both doctors are in agreement that RT would be the best therapy treatment plan with low risk involved for pt (per Dr. Basilio Cairo). She is agreeable to radiation therapy after this conversation and wants to proceed with treatment. Rn will contact CT sim to get her scheduled as soon as possible. Pt knows to call with any concerns or questions.

## 2022-12-22 NOTE — Telephone Encounter (Signed)
RN left message on voicemail for pt to inform her of her CT simulation appointment on Thursday 12-24-22 at 8 am. RN will attempt to call tomorrow to ensure message was received.

## 2022-12-23 ENCOUNTER — Telehealth: Payer: Self-pay

## 2022-12-23 NOTE — Telephone Encounter (Signed)
RN called to verify that pt was good with new appointment date and time for CT Sim (Tues 12-29-22 at 10am). She stated this would work for her. Rn sent an email to CT sim to officially book the appointment for the pt.

## 2022-12-24 ENCOUNTER — Ambulatory Visit: Payer: Medicare HMO | Admitting: Radiation Oncology

## 2022-12-24 ENCOUNTER — Telehealth: Payer: Medicare HMO

## 2022-12-24 ENCOUNTER — Ambulatory Visit
Admission: RE | Admit: 2022-12-24 | Discharge: 2022-12-24 | Disposition: A | Discharge status: Home / Self Care | Payer: Medicare HMO | Source: Ambulatory Visit | Attending: Interventional Radiology | Admitting: Interventional Radiology

## 2022-12-24 DIAGNOSIS — S22070A Wedge compression fracture of T9-T10 vertebra, initial encounter for closed fracture: Secondary | ICD-10-CM

## 2022-12-24 HISTORY — PX: IR RADIOLOGIST EVAL & MGMT: IMG5224

## 2022-12-24 NOTE — Progress Notes (Signed)
Chief Complaint: Patient was consulted remotely today (TeleHealth) for T9 pathologic fracture at the request of Mycah Formica K.    Referring Physician(s): Serena Croissant, MD  History of Present Illness: Theresa [name] is a 67 y.o. female With a history of breast cancer and a newly developed T9 compression fracture concerning for pathologic fracture.  She underwent combined transpedicular biopsy, radiofrequency ablation and cement augmentation with balloon kyphoplasty on 12/10/2022.  I spoke to her over the telephone today at her 2-week follow-up.  Initially, she had worsening pain approximately 1 week following the procedure.  She was referred to Dr. Lonie Peak of radiation oncology.  At the time she saw Dr. Basilio Cairo her pain was beginning to improve.  Today, she reports that her pain is significantly improved.  Additionally, the difficulty with breathing that she was having is also improving.  I discussed with her the benefits of pursuing radiation therapy in addition to the completed OsteoCool procedure.  The procedures are complementary and result in an overall greater fact in terms of both local control and pain relief.  She tells me that Dr. Luane School is setting her up for radiation therapy which should begin next Tuesday.  Past Medical History:  Diagnosis Date   Breast cancer, right    Contact lens/glasses fitting    wears contacts or glasses   Diarrhea    --post gallbladder surgery   Family history of bone cancer    Family history of brain cancer    Family history of stomach cancer    Family history of uterine cancer    Fibroids    History of radiation therapy    Hypertension    No pertinent past medical history    Personal history of radiation therapy    PONV (postoperative nausea and vomiting)    Right ovarian cyst    Vertigo     Past Surgical History:  Procedure Laterality Date   BLADDER SUSPENSION     BREAST BIOPSY  09/08/2012   Procedure: BREAST BIOPSY;  Surgeon:  Adolph Pollack, MD;  Location: [facility];  Service: General;  Laterality: Left;  remove left breast mass   BREAST LUMPECTOMY Right 10/2019   BREAST LUMPECTOMY WITH RADIOACTIVE SEED AND SENTINEL LYMPH NODE BIOPSY Right 10/04/2019   Procedure: RIGHT BREAST LUMPECTOMY WITH BRACKETED RADIOACTIVE SEEDS, RIGHT BREAST RADIOACTIVE SEED GUIDED EXCISION BIOPSY, AND RIGHT SENTINEL LYMPH NODE BIOPSY;  Surgeon: Almond Lint, MD;  Location: [facility];  Service: General;  Laterality: Right;   BREAST SURGERY     lumpectomy x2   BUNIONECTOMY Right 01/2016   CHOLECYSTECTOMY     COLONOSCOPY     CYSTOSCOPY N/A 01/16/2020   Procedure: CYSTOSCOPY;  Surgeon: Patton Salles, MD;  Location: [facility];  Service: Gynecology;  Laterality: N/A;   IR BONE TUMOR(S)RF ABLATION  12/10/2022   IR KYPHO THORACIC WITH BONE BIOPSY  12/10/2022   IR RADIOLOGIST EVAL & MGMT  12/24/2022   MASTOPEXY Right 10/04/2019   Procedure: RIGHT BREAST MASTOPEXY;  Surgeon: Almond Lint, MD;  Location: [facility];  Service: General;  Laterality: Right;   RE-EXCISION OF BREAST LUMPECTOMY Right 10/31/2019   Procedure: RIGHT RE-EXCISION OF BREAST LUMPECTOMY;  Surgeon: Almond Lint, MD;  Location: [facility];  Service: General;  Laterality: Right;   TOTAL LAPAROSCOPIC HYSTERECTOMY WITH BILATERAL SALPINGO OOPHORECTOMY N/A 01/16/2020   Procedure: TOTAL LAPAROSCOPIC HYSTERECTOMY WITH BILATERAL SALPINGO OOPHORECTOMY/COLLECTION OF PELVIC WASHINGS, LYSIS OF ADHESIONS;  Surgeon: Patton Salles, MD;  Location: [facility];  Service: Gynecology;  Laterality: N/A;  collection of pelvic washings   TUBAL LIGATION      Allergies: Metronidazole, Other, and Tape  Medications: Prior to Admission medications   Medication Sig Start Date End Date Taking? Authorizing Provider  abemaciclib (VERZENIO) 100 MG tablet Take 1 tablet (100 mg total) by mouth 2  (two) times daily. 12/09/22   Serena Croissant, MD  gabapentin (NEURONTIN) 300 MG capsule Take 1 capsule (300 mg total) by mouth at bedtime. 06/30/22   Serena Croissant, MD  hydrocortisone 2.5 % cream Apply topically 3 (three) times daily as needed. Patient not taking: Reported on 11/03/2022 12/11/19   Lonie Peak, MD  letrozole [facility]) 2.5 MG tablet TAKE 1 TABLET BY MOUTH EVERY DAY 10/23/22   Serena Croissant, MD  loperamide (IMODIUM) 2 MG capsule Take 2 mg by mouth in the morning and at bedtime.     [provider]  LORazepam (ATIVAN) 0.5 MG tablet TAKE 1 TABLET BY MOUTH EVERY 8 HOURS AS NEEDED FOR ANXIETY. 12/15/22   Serena Croissant, MD  methocarbamol (ROBAXIN) 500 MG tablet Take 500 mg by mouth 2 (two) times daily as needed for muscle spasms. Patient not taking: Reported on 12/04/2022    [provider]  ondansetron (ZOFRAN) 8 MG tablet Take 1 tablet (8 mg total) by mouth every 8 (eight) hours as needed for nausea or vomiting. 11/03/22   Serena Croissant, MD  oxyCODONE-acetaminophen (PERCOCET/ROXICET) 5-325 MG tablet Take 1 tablet by mouth every 6 (six) hours as needed for severe pain. 12/16/22   Serena Croissant, MD  prochlorperazine (COMPAZINE) 10 MG tablet Take 1 tablet (10 mg total) by mouth every 6 (six) hours as needed for nausea or vomiting. 11/03/22   Serena Croissant, MD  triamcinolone (KENALOG) 0.025 % ointment Apply 1 Application topically 2 (two) times daily. Use for one week at a time. Patient not taking: Reported on 11/03/2022 07/08/22   Patton Salles, MD  valsartan (DIOVAN) 40 MG tablet Take by mouth. 05/06/21   [provider]  venlafaxine XR (EFFEXOR-XR) 37.5 MG 24 hr capsule Take 1 capsule (37.5 mg total) by mouth daily. 02/19/22   Rachel Moulds, MD     Family History  Problem Relation Age of Onset   Hypertension Father    Stomach cancer Father        may have been colon, diagnosed in his 65s   Depression Mother    Dementia Mother    Thyroid disease Sister     Heart disease Sister    Brain cancer Maternal Aunt 16   Cancer Maternal Grandmother        undetermined type, diagnosed in her 69s   Bone cancer Paternal Grandfather 23   Uterine cancer Paternal Aunt 69   Uterine cancer Cousin        paternal 1st cousin, diagnosed in her late 58s   Uterine cancer Cousin 31       paternal 1st cousin    Social History   Socioeconomic History   Marital status: Married    Spouse name: Daryl    Number of children: Not on file   Years of education: Not on file   Highest education level: Not on file  Occupational History   Not on file  Tobacco Use   Smoking status: Former    Types: Cigarettes    Quit date: 03/15/1996    Years since quitting: 26.7  Smokeless tobacco: Never  Vaping Use   Vaping Use: Never used  Substance and Sexual Activity   Alcohol use: Not Currently    Comment: occ wine   Drug use: No   Sexual activity: Yes    Partners: Male    Birth control/protection: Surgical    Comment: BTL/hyst  Other Topics Concern   Not on file  Social History Narrative   Not on file   Social Determinants of Health   Financial Resource Strain: Not on file  Food Insecurity: No Food Insecurity (12/18/2022)   Hunger Vital Sign    Worried About Running Out of Food in the Last Year: Never true    Ran Out of Food in the Last Year: Never true  Transportation Needs: No Transportation Needs (12/18/2022)   PRAPARE - Administrator, Civil Service (Medical): No    Lack of Transportation (Non-Medical): No  Physical Activity: Not on file  Stress: Not on file  Social Connections: Not on file    ECOG Status: 1 - Symptomatic but completely ambulatory  Review of Systems  Review of Systems: A 12 point ROS discussed and pertinent positives are indicated in the HPI above.  All other systems are negative.   Physical Exam No direct physical exam was performed (except for noted visual exam findings with Video Visits).    Vital Signs: LMP  09/01/2007 (Exact Date)   Imaging: IR Radiologist Eval & Mgmt  Result Date: 12/24/2022 EXAM: ESTABLISHED PATIENT OFFICE VISIT CHIEF COMPLAINT: SEE EPIC NOTE HISTORY OF PRESENT ILLNESS: SEE EPIC NOTE REVIEW OF SYSTEMS: SEE EPIC NOTE PHYSICAL EXAMINATION: SEE EPIC NOTE ASSESSMENT AND PLAN: SEE EPIC NOTE Electronically Signed   By: Malachy Moan M.D.   On: 12/24/2022 15:41   IR KYPHO THORACIC WITH BONE BIOPSY  Result Date: 12/10/2022 CLINICAL DATA:  67 year old female with a history of breast cancer and new T9 compression fracture concerning for possible pathologic fracture. She presents today for concurrent transpedicular bone biopsy, radiofrequency ablation and cement augmentation with balloon kyphoplasty. EXAM: FLUOROSCOPIC GUIDED T9 VERTEBRAL BODY BIOPSY RF ABLATION AND KYPHOPLASTY/CEMENT AUGMENTATION. COMPARISON:  None Available. MEDICATIONS: Ancef 2 g IV; 1 g Tylenol IV the antibiotic was administered in an appropriate time interval prior to needle puncture of the skin. ANESTHESIA/SEDATION: Versed 2 mg IV; Fentanyl 100 mcg IV Moderate Sedation Time: 40 minutes; The patient was continuously monitored during the procedure by the interventional radiology nurse under my direct supervision. FLUOROSCOPY TIME:  Radiation exposure index: 29 mGy reference air kerma COMPLICATIONS: None immediate. TECHNIQUE: Informed written consent was obtained from the patient after a thorough discussion of the procedural risks, benefits and alternatives. All questions were addressed. Maximal Sterile Barrier Technique was utilized including caps, mask, sterile gowns, sterile gloves, sterile drape, hand hygiene and skin antiseptic. A timeout was performed prior to the initiation of the procedure. The patient was placed prone on the fluoroscopic table. The skin overlying the upper thoracic region was then prepped and draped in the usual sterile fashion. Maximal barrier sterile technique was utilized including caps, mask,  sterile gowns, sterile gloves, sterile drape, hand hygiene and skin antiseptic. Intravenous Fentanyl and Versed were administered as conscious sedation during continuous cardiorespiratory monitoring by the radiology RN. The left pedicle at T9 was then infiltrated with 1% lidocaine followed by the advancement of a Kyphon trocar needle through the left pedicle into the posterior one-third of the vertebral body. A biopsy specimen was then obtained. Subsequently, the osteo drill was advanced to the  anterior third of the vertebral body. The osteo drill was retracted. Through the working cannula, a 10 mm OsteoCool RF ablation probe was inserted and positioned under fluoroscopic guidance. In similar fashion, the right T9 pedicle was infiltrated with 1% lidocaine. Utilizing a extra pedicular approach, a second Kyphon trocar needle was advanced into the posterior third of the vertebral body. A biopsy specimen was then obtained. Subsequently, the osteo drill was coaxially advanced to the anterior right third. The osteo drill was exchanged for a 10 mm OsteoCool RF ablation probe which was positioned under fluoroscopic guidance. With both OsteoCool ablation probes in place, the ablation was performed for 7.5 minutes. Attention was now paid towards the kyphoplasty portion of the procedure. Beginning at the T9 vertebral body level, a Kyphon inflatable bone tamp 15 x 2.5 was advanced through both working cannulas and positioned with the distal marker approximately 5 mm from the anterior aspect of the cortex. Appropriate positioning was confirmed on the AP projection. At this time, the balloon was expanded using contrast via a Kyphon inflation syringe device via micro tubing. Inflations were continued under direct fluoroscopic guidance. At this time, methylmethacrylate mixture was reconstituted in the Kyphon bone mixing device system. This was then loaded into the delivery mechanism, attached to Kyphon bone fillers. The balloons  were deflated and removed followed by the instillation of methylmethacrylate mixture with excellent filling in the AP and lateral projections. The working cannulae and the bone filler were then retrieved and removed. Multiple spot radiographic images were obtained in various obliquities. Hemostasis was achieved with manual compression. The patient tolerated the procedure well without immediate postprocedural complication. FINDINGS: Completion images demonstrate a technically excellent result with adequate cement filling of the T9 vertebral bodies on both the AP and lateral projections. No extravasation was noted in the disk spaces or posteriorly into the spinal canal. No epidural venous contamination was seen. IMPRESSION: Technically successful T9 vertebral body biopsy, ablation and cement augmentation using balloon kyphoplasty. PLAN: The patient will be seen for clinical follow-up at the interventional radiology clinic in 2-4 weeks. Signed, Sterling Big, MD [facility] Radiology Electronically Signed   By: Malachy Moan M.D.   On: 12/10/2022 10:54   IR Bone Tumor(s)RF Ablation  Result Date: 12/10/2022 CLINICAL DATA:  67 year old female with a history of breast cancer and new T9 compression fracture concerning for possible pathologic fracture. She presents today for concurrent transpedicular bone biopsy, radiofrequency ablation and cement augmentation with balloon kyphoplasty. EXAM: FLUOROSCOPIC GUIDED T9 VERTEBRAL BODY BIOPSY RF ABLATION AND KYPHOPLASTY/CEMENT AUGMENTATION. COMPARISON:  None Available. MEDICATIONS: Ancef 2 g IV; 1 g Tylenol IV the antibiotic was administered in an appropriate time interval prior to needle puncture of the skin. ANESTHESIA/SEDATION: Versed 2 mg IV; Fentanyl 100 mcg IV Moderate Sedation Time: 40 minutes; The patient was continuously monitored during the procedure by the interventional radiology nurse under my direct  supervision. FLUOROSCOPY TIME:  Radiation exposure index: 29 mGy reference air kerma COMPLICATIONS: None immediate. TECHNIQUE: Informed written consent was obtained from the patient after a thorough discussion of the procedural risks, benefits and alternatives. All questions were addressed. Maximal Sterile Barrier Technique was utilized including caps, mask, sterile gowns, sterile gloves, sterile drape, hand hygiene and skin antiseptic. A timeout was performed prior to the initiation of the procedure. The patient was placed prone on the fluoroscopic table. The skin overlying the upper thoracic region was then prepped and draped in the usual sterile fashion. Maximal barrier sterile technique  was utilized including caps, mask, sterile gowns, sterile gloves, sterile drape, hand hygiene and skin antiseptic. Intravenous Fentanyl and Versed were administered as conscious sedation during continuous cardiorespiratory monitoring by the radiology RN. The left pedicle at T9 was then infiltrated with 1% lidocaine followed by the advancement of a Kyphon trocar needle through the left pedicle into the posterior one-third of the vertebral body. A biopsy specimen was then obtained. Subsequently, the osteo drill was advanced to the anterior third of the vertebral body. The osteo drill was retracted. Through the working cannula, a 10 mm OsteoCool RF ablation probe was inserted and positioned under fluoroscopic guidance. In similar fashion, the right T9 pedicle was infiltrated with 1% lidocaine. Utilizing a extra pedicular approach, a second Kyphon trocar needle was advanced into the posterior third of the vertebral body. A biopsy specimen was then obtained. Subsequently, the osteo drill was coaxially advanced to the anterior right third. The osteo drill was exchanged for a 10 mm OsteoCool RF ablation probe which was positioned under fluoroscopic guidance. With both OsteoCool ablation probes in place, the ablation was performed for  7.5 minutes. Attention was now paid towards the kyphoplasty portion of the procedure. Beginning at the T9 vertebral body level, a Kyphon inflatable bone tamp 15 x 2.5 was advanced through both working cannulas and positioned with the distal marker approximately 5 mm from the anterior aspect of the cortex. Appropriate positioning was confirmed on the AP projection. At this time, the balloon was expanded using contrast via a Kyphon inflation syringe device via micro tubing. Inflations were continued under direct fluoroscopic guidance. At this time, methylmethacrylate mixture was reconstituted in the Kyphon bone mixing device system. This was then loaded into the delivery mechanism, attached to Kyphon bone fillers. The balloons were deflated and removed followed by the instillation of methylmethacrylate mixture with excellent filling in the AP and lateral projections. The working cannulae and the bone filler were then retrieved and removed. Multiple spot radiographic images were obtained in various obliquities. Hemostasis was achieved with manual compression. The patient tolerated the procedure well without immediate postprocedural complication. FINDINGS: Completion images demonstrate a technically excellent result with adequate cement filling of the T9 vertebral bodies on both the AP and lateral projections. No extravasation was noted in the disk spaces or posteriorly into the spinal canal. No epidural venous contamination was seen. IMPRESSION: Technically successful T9 vertebral body biopsy, ablation and cement augmentation using balloon kyphoplasty. PLAN: The patient will be seen for clinical follow-up at the interventional radiology clinic in 2-4 weeks. Signed, Sterling Big, MD [facility] Radiology Electronically Signed   By: Malachy Moan M.D.   On: 12/10/2022 10:54   CT Angio Chest PE W and/or Wo Contrast  Result Date: 11/26/2022 CLINICAL DATA:   Shortness of breath. Back pain for several weeks. History of breast cancer * Tracking Code: BO * EXAM: CT ANGIOGRAPHY CHEST WITH CONTRAST TECHNIQUE: Multidetector CT imaging of the chest was performed using the standard protocol during bolus administration of intravenous contrast. Multiplanar CT image reconstructions and MIPs were obtained to evaluate the vascular anatomy. RADIATION DOSE REDUCTION: This exam was performed according to the departmental dose-optimization program which includes automated exposure control, adjustment of the mA and/or kV according to patient size and/or use of iterative reconstruction technique. CONTRAST:  75mL OMNIPAQUE IOHEXOL 350 MG/ML SOLN COMPARISON:  CT chest abdomen pelvis 09/29/2022 FINDINGS: Cardiovascular: Heart is nonenlarged. No pericardial effusion. Normal caliber thoracic aorta. No segmental or larger pulmonary embolism  identified. Mediastinum/Nodes: No abnormal lymph node enlargement identified in the axillary regions. Normal caliber thoracic esophagus. Mediastinal nodes and hilar nodes are again seen. The subcarinal node which previously measured 18 mm in short axis, today on series 4 image 59 measures 16 x 24 mm. Right paratracheal node which previously measured 7 mm in short axis, today on series 4, image 44 measures 7 x 11 mm. Right hilar node which previously measured 13 mm in transverse dimension, today measures 11 mm. Other nodes are similar as well. Lungs/Pleura: Breathing motion seen. There is some dependent areas of ground-glass in both lungs, favoring atelectasis. No consolidation, pneumothorax or effusion. The areas of tree-in-bud nodularity are stable. Calcified nodule left upper lobe on series 6, image 40 as well. Upper Abdomen: Fatty liver infiltration. The liver appears enlarged. Large right-sided liver mass is again identified but less well defined with the contrast bolus. Smaller left hepatic mass is also seen at the edge of the imaging field. The adrenal  glands are preserved. Musculoskeletal: Degenerative changes of the spine. There is some compression deformity of the T9 level with lucent sclerotic bone changes. Mother with a bone lesions are seen. The compression is new from prior exam. Please see separate dictation of thoracic spine CT. Review of the MIP images confirms the above findings. IMPRESSION: No pulmonary embolism identified.  Mild breathing motion. Stable mediastinal and hilar lymph nodes consistent with known history of neoplasm. Sclerotic and lytic bone lesions are identified. There is new compression at T9 vertebral body. Pathologic compression injury is possible. Please correlate with separate thoracic spine CT Fatty liver infiltration. Liver masses are again noted but less well defined with contrast bolus and field-of-view. Electronically Signed   By: Karen Kays M.D.   On: 11/26/2022 12:51   CT T-SPINE NO CHARGE  Result Date: 11/26/2022 CLINICAL DATA:  Provided history: Metastatic disease evaluation with worsened back pain. Shortness of breath with thoracic back pain. Cough. History of breast cancer. EXAM: CT THORACIC SPINE WITHOUT CONTRAST TECHNIQUE: Multidetector CT images of the thoracic were obtained using the standard protocol without intravenous contrast. RADIATION DOSE REDUCTION: This exam was performed according to the departmental dose-optimization program which includes automated exposure control, adjustment of the mA and/or kV according to patient size and/or use of iterative reconstruction technique. COMPARISON:  CT chest/abdomen/pelvis 09/29/2022.  PET CT 09/07/2022. FINDINGS: Alignment: No significant spondylolisthesis. No significant bony retropulsion. Vertebrae: Acute/subacute T9 vertebral compression fracture (30-40% height loss), new from the prior head CT of 10/08/2022. No significant bony retropulsion at this level. No compression fracture or suspicious osseous lesion elsewhere within the thoracic spine. At the periphery  of the field of view, there is a mild L3 superior endplate vertebral compression which is also new from the prior PET CT. Suggestion of irregular lucency within the imaged portions of the L3 vertebral body highly suspicious for osseous metastatic disease. Paraspinal and other soft tissues: Intrathoracic soft tissue findings reported on concurrently performed chest CT. No paraspinal mass or collection. Disc levels: Multilevel disc space narrowing, greatest at T8-T9 (moderate at this level). No appreciable significant spinal canal stenosis. No significant bony neural foraminal narrowing. IMPRESSION: 1. Acute/subacute T9 vertebral compression fracture (30-40% height loss), new from the prior PET CT of 10/08/2022. 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 2. Mild L3 superior endplate vertebral compression, also new from the prior PET CT. Suggestion of irregular lucency within the partially imaged L3 vertebral body, highly suspicious  for osseous metastatic disease. An MRI of the lumbar spine (without and with contrast) is recommended for further evaluation. Electronically Signed   By: Jackey Loge D.O.   On: 11/26/2022 12:50   DG Chest 2 View  Result Date: 11/26/2022 CLINICAL DATA:  Shortness of breath and back pain EXAM: CHEST - 2 VIEW COMPARISON:  No prior chest radiograph available, correlation is made with CT chest abdomen pelvis 09/29/2022 FINDINGS: Cardiac and mediastinal contours are within normal limits. No focal pulmonary opacity. No pleural effusion or pneumothorax. No acute osseous abnormality. IMPRESSION: No acute cardiopulmonary process. Electronically Signed   By: Wiliam Ke M.D.   On: 11/26/2022 10:59    Labs:  CBC: Recent Labs    11/18/22 1025 11/26/22 1034 11/30/22 0940 12/16/22 1101  WBC 3.6* 4.1 4.3 5.8  HGB 10.6* 10.3* 10.4* 10.5*  HCT 31.4* 31.8* 31.4* 30.8*  PLT 60* 78* 66* 67*    COAGS: Recent Labs     09/28/22 1453 10/01/22 0754 10/06/22 0735 10/07/22 1354 10/13/22 0814 11/26/22 1034  INR 1.9* 1.9* 1.8* 1.7* 1.6* 1.4*  APTT 37* 36  --  35  --   --     BMP: Recent Labs    11/18/22 1025 11/26/22 1034 11/30/22 0940 12/16/22 1101  NA 137 136 137 133*  K 4.5 4.3 4.4 4.4  CL 105 103 105 101  CO2 25 24 25 26   GLUCOSE 134* 117* 104* 129*  BUN 14 15 13 15   CALCIUM 9.9 10.2 10.2 11.4*  CREATININE 0.99 1.08* 1.21* 1.19*  GFRNONAA >60 57* 50* 51*    LIVER FUNCTION TESTS: Recent Labs    11/18/22 1025 11/26/22 1034 11/30/22 0940 12/16/22 1101  BILITOT 0.4 0.4 0.5 0.6  AST 77* 94* 99* 137*  ALT 24 27 27  33  ALKPHOS 109 102 133* 186*  PROT 6.2* 6.4* 6.4* 6.7  ALBUMIN 4.0 3.7 3.8 3.9    TUMOR MARKERS: No results for input(s): "AFPTM", "CEA", "CA199", "CHROMGRNA" in the last 8760 hours.  Assessment and Plan:  Extremely pleasant 67 year old female with breast cancer and osseous metastatic disease resulting in a pathologic compression fracture of the T9 vertebral body.  She is now 2 weeks status post combined transpedicular biopsy, radiofrequency ablation and cement augmentation with balloon kyphoplasty (OsteoCool).  She is feeling much better today.  It has been a slow process but she is noticing significant symptomatic relief as well as improvement in her breathing.  She will be seeing Dr. Basilio Cairo of radiation oncology to pursue additional radiation therapy to the site of confirmed pathologic fracture. This is ideal as ostial cool and radiation therapy are complementary procedures with a synergistic effect resulting in more complete and rapid pain relief as well as improved local control.  No further scheduled follow-up at this time.  From our perspective, she is released to full activities as tolerated.   Electronically Signed: Sterling Big 12/24/2022, 4:10 PM   I spent a total of  15 Minutes in remote  clinical consultation, greater than 50% of which was  counseling/coordinating care for T9 pathologic fracture.    Visit type: Audio only (telephone). Audio (no video) only due to patient preference. Alternative for in-person consultation at [facility], [address], [place], [place]. This visit type was conducted due to national recommendations for restrictions regarding the COVID-19 Pandemic (e.g. social distancing).  This format is felt to be most appropriate for this patient at this time.  All issues noted in this document were  discussed and addressed.

## 2022-12-29 ENCOUNTER — Ambulatory Visit: Payer: Medicare HMO | Admitting: Hematology and Oncology

## 2022-12-29 ENCOUNTER — Ambulatory Visit
Admission: RE | Admit: 2022-12-29 | Discharge: 2022-12-29 | Disposition: A | Discharge status: Home / Self Care | Payer: Medicare HMO | Source: Ambulatory Visit | Attending: Radiation Oncology | Admitting: Radiation Oncology

## 2022-12-29 DIAGNOSIS — Z170 Estrogen receptor positive status [ER+]: Secondary | ICD-10-CM | POA: Diagnosis not present

## 2022-12-29 DIAGNOSIS — Z510 Encounter for antineoplastic radiation therapy: Secondary | ICD-10-CM | POA: Insufficient documentation

## 2022-12-29 DIAGNOSIS — C50311 Malignant neoplasm of lower-inner quadrant of right female breast: Secondary | ICD-10-CM | POA: Insufficient documentation

## 2022-12-30 ENCOUNTER — Inpatient Hospital Stay: Payer: Medicare HMO | Admitting: Pharmacist

## 2022-12-30 ENCOUNTER — Ambulatory Visit: Payer: Medicare HMO

## 2022-12-30 ENCOUNTER — Inpatient Hospital Stay: Payer: Medicare HMO

## 2022-12-31 ENCOUNTER — Ambulatory Visit: Payer: Medicare HMO

## 2023-01-01 ENCOUNTER — Telehealth: Payer: Self-pay

## 2023-01-01 ENCOUNTER — Ambulatory Visit: Payer: Medicare HMO

## 2023-01-01 DIAGNOSIS — C50311 Malignant neoplasm of lower-inner quadrant of right female breast: Secondary | ICD-10-CM | POA: Diagnosis present

## 2023-01-01 DIAGNOSIS — Z170 Estrogen receptor positive status [ER+]: Secondary | ICD-10-CM | POA: Diagnosis not present

## 2023-01-01 DIAGNOSIS — Z510 Encounter for antineoplastic radiation therapy: Secondary | ICD-10-CM | POA: Insufficient documentation

## 2023-01-01 NOTE — Telephone Encounter (Signed)
Returned Pt's call regarding sinus drainage and back pain. Pt states that she has a cold and that the mucous going down her throat causes her to throw up and the emesis increases her back pain. Pt states she has tried Mucinex with no relief. Advised Pt to try Sudafed OTC, take Zofran rx around the clock, and keep bland food on the stomach to prevent nausea from mucous and percocet rx. Pt verbalized understanding.

## 2023-01-04 ENCOUNTER — Other Ambulatory Visit (HOSPITAL_COMMUNITY): Payer: Self-pay

## 2023-01-04 ENCOUNTER — Ambulatory Visit: Payer: Medicare HMO

## 2023-01-05 ENCOUNTER — Ambulatory Visit: Payer: Medicare HMO

## 2023-01-05 ENCOUNTER — Other Ambulatory Visit: Payer: Self-pay

## 2023-01-06 ENCOUNTER — Other Ambulatory Visit: Payer: Self-pay

## 2023-01-06 ENCOUNTER — Ambulatory Visit
Admission: RE | Admit: 2023-01-06 | Discharge: 2023-01-06 | Disposition: A | Discharge status: Home / Self Care | Payer: Medicare HMO | Source: Ambulatory Visit | Attending: Radiation Oncology | Admitting: Radiation Oncology

## 2023-01-06 DIAGNOSIS — Z510 Encounter for antineoplastic radiation therapy: Secondary | ICD-10-CM | POA: Diagnosis not present

## 2023-01-06 LAB — RAD ONC ARIA SESSION SUMMARY
Course Elapsed Days: 0
Plan Fractions Treated to Date: 1
Plan Prescribed Dose Per Fraction: 3 Gy
Plan Total Fractions Prescribed: 10
Plan Total Prescribed Dose: 30 Gy
Reference Point Dosage Given to Date: 3 Gy
Reference Point Session Dosage Given: 3 Gy
Session Number: 1

## 2023-01-07 ENCOUNTER — Other Ambulatory Visit: Payer: Self-pay

## 2023-01-07 ENCOUNTER — Ambulatory Visit
Admission: RE | Admit: 2023-01-07 | Discharge: 2023-01-07 | Disposition: A | Discharge status: Home / Self Care | Payer: Medicare HMO | Source: Ambulatory Visit | Attending: Radiation Oncology | Admitting: Radiation Oncology

## 2023-01-07 DIAGNOSIS — Z510 Encounter for antineoplastic radiation therapy: Secondary | ICD-10-CM | POA: Diagnosis not present

## 2023-01-07 LAB — RAD ONC ARIA SESSION SUMMARY
Course Elapsed Days: 1
Plan Fractions Treated to Date: 2
Plan Prescribed Dose Per Fraction: 3 Gy
Plan Total Fractions Prescribed: 10
Plan Total Prescribed Dose: 30 Gy
Reference Point Dosage Given to Date: 6 Gy
Reference Point Session Dosage Given: 3 Gy
Session Number: 2

## 2023-01-08 ENCOUNTER — Other Ambulatory Visit: Payer: Self-pay

## 2023-01-08 ENCOUNTER — Ambulatory Visit
Admission: RE | Admit: 2023-01-08 | Discharge: 2023-01-08 | Disposition: A | Discharge status: Home / Self Care | Payer: Medicare HMO | Source: Ambulatory Visit | Attending: Radiation Oncology | Admitting: Radiation Oncology

## 2023-01-08 DIAGNOSIS — Z510 Encounter for antineoplastic radiation therapy: Secondary | ICD-10-CM | POA: Diagnosis not present

## 2023-01-08 LAB — RAD ONC ARIA SESSION SUMMARY
Course Elapsed Days: 2
Plan Fractions Treated to Date: 3
Plan Prescribed Dose Per Fraction: 3 Gy
Plan Total Fractions Prescribed: 10
Plan Total Prescribed Dose: 30 Gy
Reference Point Dosage Given to Date: 9 Gy
Reference Point Session Dosage Given: 3 Gy
Session Number: 3

## 2023-01-11 ENCOUNTER — Emergency Department (HOSPITAL_BASED_OUTPATIENT_CLINIC_OR_DEPARTMENT_OTHER): Payer: Medicare HMO

## 2023-01-11 ENCOUNTER — Other Ambulatory Visit (HOSPITAL_BASED_OUTPATIENT_CLINIC_OR_DEPARTMENT_OTHER): Payer: Self-pay

## 2023-01-11 ENCOUNTER — Telehealth: Payer: Self-pay

## 2023-01-11 ENCOUNTER — Ambulatory Visit: Payer: Medicare HMO

## 2023-01-11 ENCOUNTER — Other Ambulatory Visit: Payer: Self-pay

## 2023-01-11 ENCOUNTER — Encounter (HOSPITAL_BASED_OUTPATIENT_CLINIC_OR_DEPARTMENT_OTHER): Payer: Self-pay | Admitting: Emergency Medicine

## 2023-01-11 ENCOUNTER — Inpatient Hospital Stay (HOSPITAL_BASED_OUTPATIENT_CLINIC_OR_DEPARTMENT_OTHER)
Admission: EM | Admit: 2023-01-11 | Discharge: 2023-01-21 | DRG: 166 | Disposition: A | Discharge status: Home Health | Payer: Medicare HMO | Attending: Family Medicine | Admitting: Family Medicine

## 2023-01-11 DIAGNOSIS — I10 Essential (primary) hypertension: Secondary | ICD-10-CM | POA: Diagnosis present

## 2023-01-11 DIAGNOSIS — Z8049 Family history of malignant neoplasm of other genital organs: Secondary | ICD-10-CM | POA: Diagnosis not present

## 2023-01-11 DIAGNOSIS — R0602 Shortness of breath: Secondary | ICD-10-CM | POA: Diagnosis not present

## 2023-01-11 DIAGNOSIS — C7951 Secondary malignant neoplasm of bone: Secondary | ICD-10-CM | POA: Diagnosis not present

## 2023-01-11 DIAGNOSIS — C787 Secondary malignant neoplasm of liver and intrahepatic bile duct: Secondary | ICD-10-CM | POA: Diagnosis present

## 2023-01-11 DIAGNOSIS — Z170 Estrogen receptor positive status [ER+]: Secondary | ICD-10-CM

## 2023-01-11 DIAGNOSIS — I2721 Secondary pulmonary arterial hypertension: Secondary | ICD-10-CM | POA: Diagnosis not present

## 2023-01-11 DIAGNOSIS — C50311 Malignant neoplasm of lower-inner quadrant of right female breast: Secondary | ICD-10-CM | POA: Diagnosis present

## 2023-01-11 DIAGNOSIS — Z800 Family history of malignant neoplasm of digestive organs: Secondary | ICD-10-CM | POA: Diagnosis not present

## 2023-01-11 DIAGNOSIS — Z79899 Other long term (current) drug therapy: Secondary | ICD-10-CM | POA: Diagnosis not present

## 2023-01-11 DIAGNOSIS — Z66 Do not resuscitate: Secondary | ICD-10-CM | POA: Diagnosis present

## 2023-01-11 DIAGNOSIS — D539 Nutritional anemia, unspecified: Secondary | ICD-10-CM | POA: Diagnosis not present

## 2023-01-11 DIAGNOSIS — R627 Adult failure to thrive: Secondary | ICD-10-CM | POA: Diagnosis not present

## 2023-01-11 DIAGNOSIS — R7989 Other specified abnormal findings of blood chemistry: Secondary | ICD-10-CM | POA: Diagnosis present

## 2023-01-11 DIAGNOSIS — Z808 Family history of malignant neoplasm of other organs or systems: Secondary | ICD-10-CM

## 2023-01-11 DIAGNOSIS — M8458XA Pathological fracture in neoplastic disease, other specified site, initial encounter for fracture: Secondary | ICD-10-CM

## 2023-01-11 DIAGNOSIS — Z79811 Long term (current) use of aromatase inhibitors: Secondary | ICD-10-CM | POA: Diagnosis not present

## 2023-01-11 DIAGNOSIS — D61818 Other pancytopenia: Secondary | ICD-10-CM | POA: Diagnosis not present

## 2023-01-11 DIAGNOSIS — J9621 Acute and chronic respiratory failure with hypoxia: Secondary | ICD-10-CM | POA: Diagnosis not present

## 2023-01-11 DIAGNOSIS — Z90710 Acquired absence of both cervix and uterus: Secondary | ICD-10-CM

## 2023-01-11 DIAGNOSIS — Z1152 Encounter for screening for COVID-19: Secondary | ICD-10-CM

## 2023-01-11 DIAGNOSIS — Z8249 Family history of ischemic heart disease and other diseases of the circulatory system: Secondary | ICD-10-CM | POA: Diagnosis not present

## 2023-01-11 DIAGNOSIS — R5381 Other malaise: Secondary | ICD-10-CM | POA: Diagnosis present

## 2023-01-11 DIAGNOSIS — Z923 Personal history of irradiation: Secondary | ICD-10-CM | POA: Diagnosis not present

## 2023-01-11 DIAGNOSIS — F419 Anxiety disorder, unspecified: Secondary | ICD-10-CM | POA: Diagnosis present

## 2023-01-11 DIAGNOSIS — S22070A Wedge compression fracture of T9-T10 vertebra, initial encounter for closed fracture: Secondary | ICD-10-CM | POA: Diagnosis present

## 2023-01-11 DIAGNOSIS — D6959 Other secondary thrombocytopenia: Secondary | ICD-10-CM | POA: Diagnosis not present

## 2023-01-11 DIAGNOSIS — Z91048 Other nonmedicinal substance allergy status: Secondary | ICD-10-CM

## 2023-01-11 DIAGNOSIS — R748 Abnormal levels of other serum enzymes: Secondary | ICD-10-CM

## 2023-01-11 DIAGNOSIS — Z87891 Personal history of nicotine dependence: Secondary | ICD-10-CM | POA: Diagnosis not present

## 2023-01-11 DIAGNOSIS — J159 Unspecified bacterial pneumonia: Secondary | ICD-10-CM | POA: Diagnosis not present

## 2023-01-11 DIAGNOSIS — Z9221 Personal history of antineoplastic chemotherapy: Secondary | ICD-10-CM | POA: Diagnosis not present

## 2023-01-11 DIAGNOSIS — J9691 Respiratory failure, unspecified with hypoxia: Secondary | ICD-10-CM | POA: Diagnosis present

## 2023-01-11 DIAGNOSIS — J9601 Acute respiratory failure with hypoxia: Secondary | ICD-10-CM | POA: Diagnosis present

## 2023-01-11 DIAGNOSIS — E222 Syndrome of inappropriate secretion of antidiuretic hormone: Secondary | ICD-10-CM | POA: Diagnosis not present

## 2023-01-11 DIAGNOSIS — Z8349 Family history of other endocrine, nutritional and metabolic diseases: Secondary | ICD-10-CM

## 2023-01-11 DIAGNOSIS — Z818 Family history of other mental and behavioral disorders: Secondary | ICD-10-CM

## 2023-01-11 DIAGNOSIS — R918 Other nonspecific abnormal finding of lung field: Secondary | ICD-10-CM

## 2023-01-11 DIAGNOSIS — D696 Thrombocytopenia, unspecified: Secondary | ICD-10-CM

## 2023-01-11 DIAGNOSIS — J189 Pneumonia, unspecified organism: Secondary | ICD-10-CM | POA: Diagnosis present

## 2023-01-11 DIAGNOSIS — R160 Hepatomegaly, not elsewhere classified: Secondary | ICD-10-CM | POA: Diagnosis present

## 2023-01-11 LAB — CBC WITH DIFFERENTIAL/PLATELET
Abs Immature Granulocytes: 0.18 10*3/uL — ABNORMAL HIGH (ref 0.00–0.07)
Basophils Absolute: 0.1 10*3/uL (ref 0.0–0.1)
Basophils Relative: 1 %
Eosinophils Absolute: 0.1 10*3/uL (ref 0.0–0.5)
Eosinophils Relative: 1 %
HCT: 30.8 % — ABNORMAL LOW (ref 36.0–46.0)
Hemoglobin: 10.1 g/dL — ABNORMAL LOW (ref 12.0–15.0)
Immature Granulocytes: 4 %
Lymphocytes Relative: 28 %
Lymphs Abs: 1.4 10*3/uL (ref 0.7–4.0)
MCH: 33.2 pg (ref 26.0–34.0)
MCHC: 32.8 g/dL (ref 30.0–36.0)
MCV: 101.3 fL — ABNORMAL HIGH (ref 80.0–100.0)
Monocytes Absolute: 0.5 10*3/uL (ref 0.1–1.0)
Monocytes Relative: 11 %
Neutro Abs: 2.7 10*3/uL (ref 1.7–7.7)
Neutrophils Relative %: 55 %
Platelets: 40 10*3/uL — ABNORMAL LOW (ref 150–400)
RBC: 3.04 MIL/uL — ABNORMAL LOW (ref 3.87–5.11)
RDW: 21.2 % — ABNORMAL HIGH (ref 11.5–15.5)
WBC: 4.9 10*3/uL (ref 4.0–10.5)
nRBC: 7.6 % — ABNORMAL HIGH (ref 0.0–0.2)

## 2023-01-11 LAB — COMPREHENSIVE METABOLIC PANEL
ALT: 50 U/L — ABNORMAL HIGH (ref 0–44)
AST: 193 U/L — ABNORMAL HIGH (ref 15–41)
Albumin: 3.8 g/dL (ref 3.5–5.0)
Alkaline Phosphatase: 324 U/L — ABNORMAL HIGH (ref 38–126)
Anion gap: 11 (ref 5–15)
BUN: 14 mg/dL (ref 8–23)
CO2: 21 mmol/L — ABNORMAL LOW (ref 22–32)
Calcium: 9.2 mg/dL (ref 8.9–10.3)
Chloride: 104 mmol/L (ref 98–111)
Creatinine, Ser: 0.94 mg/dL (ref 0.44–1.00)
GFR, Estimated: >60 mL/min (ref >60)
Glucose, Bld: 104 mg/dL — ABNORMAL HIGH (ref 70–99)
Potassium: 4.6 mmol/L (ref 3.5–5.1)
Sodium: 136 mmol/L (ref 135–145)
Total Bilirubin: 1.7 mg/dL — ABNORMAL HIGH (ref 0.3–1.2)
Total Protein: 6.6 g/dL (ref 6.5–8.1)

## 2023-01-11 LAB — I-STAT VENOUS BLOOD GAS, ED
Acid-base deficit: 4 mmol/L — ABNORMAL HIGH (ref 0.0–2.0)
Bicarbonate: 20.1 mmol/L (ref 20.0–28.0)
Calcium, Ion: 1.23 mmol/L (ref 1.15–1.40)
HCT: 33 % — ABNORMAL LOW (ref 36.0–46.0)
Hemoglobin: 11.2 g/dL — ABNORMAL LOW (ref 12.0–15.0)
O2 Saturation: 54 %
Patient temperature: 98.7
Potassium: 4.6 mmol/L (ref 3.5–5.1)
Sodium: 135 mmol/L (ref 135–145)
TCO2: 21 mmol/L — ABNORMAL LOW (ref 22–32)
pCO2, Ven: 33 mmHg — ABNORMAL LOW (ref 44–60)
pH, Ven: 7.393 (ref 7.25–7.43)
pO2, Ven: 29 mmHg — CL (ref 32–45)

## 2023-01-11 LAB — BRAIN NATRIURETIC PEPTIDE: B Natriuretic Peptide: 193.4 pg/mL — ABNORMAL HIGH (ref 0.0–100.0)

## 2023-01-11 LAB — HEPATITIS PANEL, ACUTE
HCV Ab: NONREACTIVE
Hep A IgM: NONREACTIVE
Hep B C IgM: NONREACTIVE
Hepatitis B Surface Ag: NONREACTIVE

## 2023-01-11 LAB — PROTIME-INR
INR: 1.4 — ABNORMAL HIGH (ref 0.8–1.2)
Prothrombin Time: 17.6 seconds — ABNORMAL HIGH (ref 11.4–15.2)

## 2023-01-11 LAB — SARS CORONAVIRUS 2 BY RT PCR: SARS Coronavirus 2 by RT PCR: NEGATIVE

## 2023-01-11 LAB — LIPASE, BLOOD: Lipase: 85 U/L — ABNORMAL HIGH (ref 11–51)

## 2023-01-11 MED ORDER — SODIUM CHLORIDE 0.9 % IV SOLN
500.0000 mg | INTRAVENOUS | Status: DC
  Administered 2023-01-12 – 2023-01-15 (×4): 500 mg via INTRAVENOUS
  Filled 2023-01-11 (×4): qty 5

## 2023-01-11 MED ORDER — ALBUTEROL SULFATE (2.5 MG/3ML) 0.083% IN NEBU
2.5000 mg | INHALATION_SOLUTION | RESPIRATORY_TRACT | Status: DC | PRN
  Administered 2023-01-15: 2.5 mg via RESPIRATORY_TRACT
  Filled 2023-01-11: qty 3

## 2023-01-11 MED ORDER — OXYCODONE-ACETAMINOPHEN 5-325 MG PO TABS
1.0000 | ORAL_TABLET | Freq: Four times a day (QID) | ORAL | Status: DC | PRN
  Administered 2023-01-11 – 2023-01-13 (×3): 1 via ORAL
  Filled 2023-01-11 (×3): qty 1

## 2023-01-11 MED ORDER — SODIUM CHLORIDE 0.9 % IV BOLUS
1000.0000 mL | Freq: Once | INTRAVENOUS | Status: AC
  Administered 2023-01-11: 1000 mL via INTRAVENOUS

## 2023-01-11 MED ORDER — VENLAFAXINE HCL ER 37.5 MG PO CP24
37.5000 mg | ORAL_CAPSULE | Freq: Every day | ORAL | Status: DC
  Administered 2023-01-11 – 2023-01-21 (×11): 37.5 mg via ORAL
  Filled 2023-01-11 (×11): qty 1

## 2023-01-11 MED ORDER — ONDANSETRON HCL 4 MG/2ML IJ SOLN
4.0000 mg | Freq: Four times a day (QID) | INTRAMUSCULAR | Status: DC | PRN
  Administered 2023-01-12 – 2023-01-19 (×3): 4 mg via INTRAVENOUS
  Filled 2023-01-11 (×3): qty 2

## 2023-01-11 MED ORDER — TRAZODONE HCL 50 MG PO TABS
25.0000 mg | ORAL_TABLET | Freq: Every evening | ORAL | Status: DC | PRN
  Administered 2023-01-13: 25 mg via ORAL
  Filled 2023-01-11 (×2): qty 1

## 2023-01-11 MED ORDER — LORAZEPAM 0.5 MG PO TABS
0.5000 mg | ORAL_TABLET | Freq: Three times a day (TID) | ORAL | Status: DC | PRN
  Administered 2023-01-15 – 2023-01-21 (×5): 0.5 mg via ORAL
  Filled 2023-01-11 (×5): qty 1

## 2023-01-11 MED ORDER — GUAIFENESIN-DM 100-10 MG/5ML PO SYRP
5.0000 mL | ORAL_SOLUTION | ORAL | Status: DC | PRN
  Administered 2023-01-11 – 2023-01-17 (×5): 5 mL via ORAL
  Filled 2023-01-11 (×5): qty 5

## 2023-01-11 MED ORDER — METOPROLOL TARTRATE 5 MG/5ML IV SOLN: 5.0000 mg | Freq: Four times a day (QID) | INTRAVENOUS | Status: DC | PRN

## 2023-01-11 MED ORDER — ACETAMINOPHEN 650 MG RE SUPP: 650.0000 mg | Freq: Four times a day (QID) | RECTAL | Status: DC | PRN

## 2023-01-11 MED ORDER — SODIUM CHLORIDE 0.9 % IV SOLN
500.0000 mg | Freq: Once | INTRAVENOUS | Status: AC
  Administered 2023-01-11: 500 mg via INTRAVENOUS
  Filled 2023-01-11: qty 5

## 2023-01-11 MED ORDER — IRBESARTAN 75 MG PO TABS
37.5000 mg | ORAL_TABLET | Freq: Every day | ORAL | Status: DC
  Administered 2023-01-11 – 2023-01-14 (×4): 37.5 mg via ORAL
  Filled 2023-01-11 (×4): qty 1

## 2023-01-11 MED ORDER — FAMOTIDINE 20 MG PO TABS
10.0000 mg | ORAL_TABLET | Freq: Two times a day (BID) | ORAL | Status: DC
  Administered 2023-01-11 – 2023-01-21 (×21): 10 mg via ORAL
  Filled 2023-01-11 (×21): qty 1

## 2023-01-11 MED ORDER — ACETAMINOPHEN 325 MG PO TABS
650.0000 mg | ORAL_TABLET | Freq: Four times a day (QID) | ORAL | Status: DC | PRN
  Administered 2023-01-14 – 2023-01-21 (×2): 650 mg via ORAL
  Filled 2023-01-11 (×2): qty 2

## 2023-01-11 MED ORDER — IOHEXOL 350 MG/ML SOLN
100.0000 mL | Freq: Once | INTRAVENOUS | Status: AC | PRN
  Administered 2023-01-11: 75 mL via INTRAVENOUS

## 2023-01-11 MED ORDER — SODIUM CHLORIDE 0.9 % IV SOLN
1.0000 g | Freq: Once | INTRAVENOUS | Status: AC
  Administered 2023-01-11: 1 g via INTRAVENOUS
  Filled 2023-01-11: qty 10

## 2023-01-11 MED ORDER — ONDANSETRON HCL 4 MG PO TABS
4.0000 mg | ORAL_TABLET | Freq: Four times a day (QID) | ORAL | Status: DC | PRN
  Administered 2023-01-11 – 2023-01-12 (×2): 4 mg via ORAL
  Filled 2023-01-11 (×2): qty 1

## 2023-01-11 MED ORDER — GABAPENTIN 300 MG PO CAPS
300.0000 mg | ORAL_CAPSULE | Freq: Every day | ORAL | Status: DC
  Administered 2023-01-11 – 2023-01-20 (×10): 300 mg via ORAL
  Filled 2023-01-11 (×10): qty 1

## 2023-01-11 MED ORDER — SODIUM CHLORIDE 0.9 % IV SOLN
1.0000 g | INTRAVENOUS | Status: DC
  Administered 2023-01-12 – 2023-01-14 (×3): 1 g via INTRAVENOUS
  Filled 2023-01-11 (×3): qty 10

## 2023-01-11 NOTE — H&P (Signed)
History and Physical  DEONDRIA BERTON MWU:[id number] DOB: 1956/05/11 DOA: 01/11/2023  PCP: Carylon Perches, MD   Chief Complaint: cough, back pain   HPI: Theresa [name] is a 67 y.o. female with medical history significant for breast cancer status post lumpectomy, chemotherapy, radiation with known liver and spinal metastases being admitted to the hospital with atypical pneumonia, new T10 pathologic compression fracture and worsening LFTs concerning for worsening metastatic liver lesions.  History is provided by the patient as well as her friend is at the bedside, says that she has been feeling poorly for the last couple weeks.  Mostly, she has been having a pretty severe persistent nonproductive cough.  She has not been feeling very for the last week or so, due to the cough, her back pain has been worse than usual and that is bothering her a lot lately.  Denies any fevers, chills, nausea, chest pain.  Has been taking Tylenol and over-the-counter cough medication, but has not been on treatment for pulmonary infection.  ED Course: Presented to [facility] this morning with complaints of cough and shortness of breath, pulse ox was 71% on room air.  Was placed on 2 L nasal cannula oxygen vital signs have been stable, lab work was pursued, she has normal white blood cell count, hemoglobin 11, platelets 40,000.  BMP is relatively unremarkable.  Normal renal function.  Currently chest is consistent with typical pneumonia, she was given IV azithromycin.  Hospitalist was contacted for admission.  She was noted to have increased LFTs from baseline, so right upper quadrant ultrasound was performed.  Review of Systems: Please see HPI for pertinent positives and negatives. A complete 10 system review of systems are otherwise negative.  Past Medical History:  Diagnosis Date   Breast cancer, right (HCC)    Contact lens/glasses fitting    wears contacts or glasses   Diarrhea    --post gallbladder surgery   Family  history of bone cancer    Family history of brain cancer    Family history of stomach cancer    Family history of uterine cancer    Fibroids    History of radiation therapy    Hypertension    No pertinent past medical history    Personal history of radiation therapy    PONV (postoperative nausea and vomiting)    Right ovarian cyst    Vertigo    Past Surgical History:  Procedure Laterality Date   BLADDER SUSPENSION     BREAST BIOPSY  09/08/2012   Procedure: BREAST BIOPSY;  Surgeon: Adolph Pollack, MD;  Location: [facility];  Service: General;  Laterality: Left;  remove left breast mass   BREAST LUMPECTOMY Right 10/2019   BREAST LUMPECTOMY WITH RADIOACTIVE SEED AND SENTINEL LYMPH NODE BIOPSY Right 10/04/2019   Procedure: RIGHT BREAST LUMPECTOMY WITH BRACKETED RADIOACTIVE SEEDS, RIGHT BREAST RADIOACTIVE SEED GUIDED EXCISION BIOPSY, AND RIGHT SENTINEL LYMPH NODE BIOPSY;  Surgeon: Almond Lint, MD;  Location:  SURGERY CENTER;  Service: General;  Laterality: Right;   BREAST SURGERY     lumpectomy x2   BUNIONECTOMY Right 01/2016   CHOLECYSTECTOMY     COLONOSCOPY     CYSTOSCOPY N/A 01/16/2020   Procedure: CYSTOSCOPY;  Surgeon: Patton Salles, MD;  Location: [facility];  Service: Gynecology;  Laterality: N/A;   IR BONE TUMOR(S)RF ABLATION  12/10/2022   IR KYPHO THORACIC WITH BONE BIOPSY  12/10/2022   IR RADIOLOGIST EVAL & MGMT  12/24/2022   MASTOPEXY Right 10/04/2019   Procedure: RIGHT BREAST MASTOPEXY;  Surgeon: Almond Lint, MD;  Location: [facility];  Service: General;  Laterality: Right;   RE-EXCISION OF BREAST LUMPECTOMY Right 10/31/2019   Procedure: RIGHT RE-EXCISION OF BREAST LUMPECTOMY;  Surgeon: Almond Lint, MD;  Location: [facility];  Service: General;  Laterality: Right;   TOTAL LAPAROSCOPIC HYSTERECTOMY WITH BILATERAL SALPINGO OOPHORECTOMY N/A 01/16/2020   Procedure: TOTAL LAPAROSCOPIC HYSTERECTOMY WITH  BILATERAL SALPINGO OOPHORECTOMY/COLLECTION OF PELVIC WASHINGS, LYSIS OF ADHESIONS;  Surgeon: Patton Salles, MD;  Location: [facility];  Service: Gynecology;  Laterality: N/A;  collection of pelvic washings   TUBAL LIGATION      Social History:  reports that she quit smoking about 26 years ago. Her smoking use included cigarettes. She has never used smokeless tobacco. She reports that she does not currently use alcohol. She reports that she does not use drugs.   Allergies  Allergen Reactions   Metronidazole Other (See Comments)    C-Diff C-Diff C-Diff   Other Other (See Comments) and Rash    Dermabond - redness and burning, blistering Dermabond - redness and burning, blistering Dermabond - redness and burning, blistering "burning" skin   Tape Rash    "burning" skin "burning" skin    Family History  Problem Relation Age of Onset   Hypertension Father    Stomach cancer Father        may have been colon, diagnosed in his 28s   Depression Mother    Dementia Mother    Thyroid disease Sister    Heart disease Sister    Brain cancer Maternal Aunt 76   Cancer Maternal Grandmother        undetermined type, diagnosed in her 10s   Bone cancer Paternal Grandfather 67   Uterine cancer Paternal Aunt 33   Uterine cancer Cousin        paternal 1st cousin, diagnosed in her late 82s   Uterine cancer Cousin 42       paternal 1st cousin     Prior to Admission medications   Medication Sig Start Date End Date Taking? Authorizing Provider  abemaciclib (VERZENIO) 100 MG tablet Take 1 tablet (100 mg total) by mouth 2 (two) times daily. 12/09/22  Yes Serena Croissant, MD  famotidine (PEPCID) 10 MG tablet Take 10 mg by mouth 2 (two) times daily.   Yes [provider]  gabapentin (NEURONTIN) 300 MG capsule Take 1 capsule (300 mg total) by mouth at bedtime. 06/30/22  Yes Serena Croissant, MD  guaiFENesin (MUCINEX) 600 MG 12 hr tablet Take 600 mg by mouth 2 (two) times  daily.   Yes [provider]  letrozole (FEMARA) 2.5 MG tablet TAKE 1 TABLET BY MOUTH EVERY DAY 10/23/22  Yes Serena Croissant, MD  LORazepam (ATIVAN) 0.5 MG tablet TAKE 1 TABLET BY MOUTH EVERY 8 HOURS AS NEEDED FOR ANXIETY. Patient taking differently: Take 0.5 mg by mouth every 8 (eight) hours as needed for anxiety. for anxiety 12/15/22  Yes Serena Croissant, MD  ondansetron (ZOFRAN) 8 MG tablet Take 1 tablet (8 mg total) by mouth every 8 (eight) hours as needed for nausea or vomiting. 11/03/22  Yes Serena Croissant, MD  oxyCODONE-acetaminophen (PERCOCET/ROXICET) 5-325 MG tablet Take 1 tablet by mouth every 6 (six) hours as needed for severe pain. 12/16/22  Yes Serena Croissant, MD  valsartan (DIOVAN) 40 MG tablet Take 40 mg by mouth daily. 05/06/21  Yes [provider]  venlafaxine XR (EFFEXOR-XR) 37.5 MG 24 hr capsule Take 1 capsule (37.5 mg total) by mouth daily. 02/19/22  Yes Rachel Moulds, MD  hydrocortisone 2.5 % cream Apply topically 3 (three) times daily as needed. Patient not taking: Reported on 11/03/2022 12/11/19   Lonie Peak, MD  loperamide (IMODIUM) 2 MG capsule Take 2 mg by mouth in the morning and at bedtime.  Patient not taking: Reported on 01/11/2023    [provider]  prochlorperazine (COMPAZINE) 10 MG tablet Take 1 tablet (10 mg total) by mouth every 6 (six) hours as needed for nausea or vomiting. Patient not taking: Reported on 01/11/2023 11/03/22   Serena Croissant, MD  triamcinolone (KENALOG) 0.025 % ointment Apply 1 Application topically 2 (two) times daily. Use for one week at a time. Patient not taking: Reported on 11/03/2022 07/08/22   Patton Salles, MD    Physical Exam: BP 137/63 (BP Location: Left Arm)   Pulse (!) 102   Temp 97.6 F (36.4 C) (Oral)   Resp 18   Ht 5\' 5"  (1.651 m)   Wt 66.4 kg   LMP 09/01/2007 (Exact Date)   SpO2 94%   BMI 24.36 kg/m   General: Appears at age, does not look acutely ill, but slightly uncomfortable and tired in  appearance.  Intermittent wet sounding cough.  Wearing 2 L nasal cannula Eyes: EOMI, clear conjuctivae, white sclerea Neck: supple, no masses, trachea mildline  Cardiovascular: RRR, no murmurs or rubs, no peripheral edema  Respiratory: clear to auscultation bilaterally, no wheezes, no crackles  Abdomen: soft, nontender, nondistended, normal bowel tones heard  Skin: dry, no rashes  Musculoskeletal: no joint effusions, normal range of motion  Psychiatric: appropriate affect, normal speech  Neurologic: extraocular muscles intact, clear speech, moving all extremities with intact sensorium          Labs on Admission:  Basic Metabolic Panel: Recent Labs  Lab 01/11/23 1011 01/11/23 1015  NA 136 135  K 4.6 4.6  CL 104  --   CO2 21*  --   GLUCOSE 104*  --   BUN 14  --   CREATININE 0.94  --   CALCIUM 9.2  --    Liver Function Tests: Recent Labs  Lab 01/11/23 1011  AST 193*  ALT 50*  ALKPHOS 324*  BILITOT 1.7*  PROT 6.6  ALBUMIN 3.8   Recent Labs  Lab 01/11/23 1011  LIPASE 85*   No results for input(s): "AMMONIA" in the last 168 hours. CBC: Recent Labs  Lab 01/11/23 1011 01/11/23 1015  WBC 4.9  --   NEUTROABS 2.7  --   HGB 10.1* 11.2*  HCT 30.8* 33.0*  MCV 101.3*  --   PLT 40*  --    Cardiac Enzymes: No results for input(s): "CKTOTAL", "CKMB", "CKMBINDEX", "TROPONINI" in the last 168 hours.  BNP (last 3 results) Recent Labs    01/11/23 1011  BNP 193.4*    ProBNP (last 3 results) No results for input(s): "PROBNP" in the last 8760 hours.  CBG: No results for input(s): "GLUCAP" in the last 168 hours.  Radiological Exams on Admission: US ABDOMEN LIMITED RUQ (LIVER/GB)  Result Date: 01/11/2023 CLINICAL DATA:  Transaminitis.  History of breast cancer. EXAM: ULTRASOUND ABDOMEN LIMITED RIGHT UPPER QUADRANT COMPARISON:  CT abdomen pelvis 09/29/2022. PET-CT scan 10/08/2022 FINDINGS: Gallbladder: Previous cholecystectomy. Common bile duct: Diameter: 4 mm. Liver:  Heterogeneous liver with multiple heterogeneous hypoechoic mass lesions. Example left hepatic lobe measures 3.4 x 2.1  x 3.6 cm. Right hepatic lobe 4.9 x 3.2 x 4.3 cm. There also some linear echogenic areas scattered in the liver. These of uncertain etiology but could represent some biliary air. Please correlate for any known history. Portal vein is patent on color Doppler imaging with normal direction of blood flow towards the liver. Other: Trace ascites IMPRESSION: Previous cholecystectomy. No biliary ductal dilatation. Question some pneumobilia in the right hepatic lobe with some linear echogenic areas. However, on the CT scan of the chest from 01/11/2023 earlier than there was no air in the biliary tree. Known liver metastases, please correlate with prior workup. Overall with changes today a contrast CT of the abdomen and pelvis may be of some benefit when clinically appropriate. Electronically Signed   By: Karen Kays M.D.   On: 01/11/2023 12:13   DG Chest Port 1 View  Result Date: 01/11/2023 CLINICAL DATA:  Shortness of breath, undergoing treatment for breast cancer. EXAM: PORTABLE CHEST 1 VIEW COMPARISON:  11/26/2022 FINDINGS: Cardiac and mediastinal contours are within normal limits given AP technique. No focal pulmonary opacity. No pleural effusion or pneumothorax. No acute osseous abnormality. IMPRESSION: No acute cardiopulmonary process. Electronically Signed   By: Wiliam Ke M.D.   On: 01/11/2023 11:16   CT Angio Chest PE W and/or Wo Contrast  Result Date: 01/11/2023 CLINICAL DATA:  Rule out pulmonary embolism.  High probability. EXAM: CT ANGIOGRAPHY CHEST WITH CONTRAST TECHNIQUE: Multidetector CT imaging of the chest was performed using the standard protocol during bolus administration of intravenous contrast. Multiplanar CT image reconstructions and MIPs were obtained to evaluate the vascular anatomy. RADIATION DOSE REDUCTION: This exam was performed according to the departmental  dose-optimization program which includes automated exposure control, adjustment of the mA and/or kV according to patient size and/or use of iterative reconstruction technique. CONTRAST:  75mL OMNIPAQUE IOHEXOL 350 MG/ML SOLN COMPARISON:  11/26/2022 FINDINGS: Cardiovascular: Satisfactory opacification of the pulmonary arteries to the segmental level. No evidence of pulmonary embolism. Normal heart size. No pericardial effusion. Mediastinum/Nodes: Thyroid gland, trachea and esophagus are unremarkable. Prominent subcarinal node measures 1.5 cm, image 113/5. Previously this measured 1.6 cm. Right hilar lymph node measures 1.5 cm, image 118/5. Unchanged from previous exam. No enlarged axillary lymph nodes. Lungs/Pleura: There is no pleural effusion identified. No airspace consolidation or pneumothorax. Stable solid nodule within the periphery of the left upper lobe measuring 4 mm. Mild patchy areas of ground-glass attenuation are noted within both lungs. Upper Abdomen: No acute abnormality. Again seen are geographic areas of fatty infiltration within the right hepatic lobe. Musculoskeletal: Multifocal lucent bone lesions are identified compatible with metastatic diseaseInterval cement augmentation of the T9 compression deformity. There is a new, mild anterior inferior endplate compression deformity involving the T10 vertebra with loss of approximately 10% of the vertebral body height, image 78/8. No retropulsion of fracture fragments identified. Review of the MIP images confirms the above findings. IMPRESSION: 1. No evidence for acute pulmonary embolus. 2. Mild patchy areas of ground-glass attenuation are noted within both lungs. This is a nonspecific finding and may be seen with small airways inflammation or infection. 3. Interval cement augmentation of the T9 compression deformity. There is a new, mild anterior inferior endplate compression deformity involving the T10 vertebra with loss of approximately 10% of the  vertebral body height. No retropulsion of fracture fragments identified. 4. Multifocal lucent bone lesions compatible with metastatic disease. Electronically Signed   By: Signa Kell M.D.   On: 01/11/2023 11:13    Assessment/Plan  This is a 67 year old female with a history of breast cancer metastatic to bone and liver, with recent pathologic T9 compression fracture underwent osteochondral procedure radiation to spinal mets.  She has had a couple of weeks of cough, shortness of breath, be admitted to the hospital with atypical pneumonia which is causing acute hypoxic respiratory failure. -Observation admission -Continue supplemental oxygen -Empiric IV azithromycin and Rocephin  Thrombocytopenia-likely due to her malignancy, avoid pharmacologic prophylaxis  Metastatic breast cancer-currently on letrozole and Verzenio, will hold for now.  Added Dr. Pamelia Hoit to treatment team.    LFT elevation with Hepatomegaly -LFTs are rising, this could be due to over-the-counter medications taken at home in the past week, would recommend CT abdomen pelvis with and without contrast to evaluate her known metastases.  Ordered for the morning to avoid further contrast load today.    T9 Compression Fx status post Osteocool with IR.    T10 compression fracture with 10% height loss, likely due to recent severe cough which has resulted in worsening back pain.  Will place TLSO brace, and consult physical therapy.  If persistent pain/debility, will consider inpatient neurosurgery/IR consultation.  DVT prophylaxis: SCDs  Full code  Consults called: None  Admission status: Observation   Time spent: 49 minutes  Day Greb Sharlette Dense MD [facility] Pager [phone]  If 7PM-7AM, please contact night-coverage [email or web address] Password [facility]  01/11/2023, 2:30 PM

## 2023-01-11 NOTE — Telephone Encounter (Signed)
Returned Pt's call regarding difficulty breathing. Pt stated she is having difficulty breathing, "nose is blue", coughing, and home pulse oximeter is reading 72%. Neighbor answered the phone, instructed neighbor to take Pt to closest emergency room. Neighbor states that Pt is currently in the bathroom. Instructed neighbor to immediately go check on Pt and if she is unable to get to car they need to call EMS. Neighbor verbalized understanding.

## 2023-01-11 NOTE — ED Triage Notes (Signed)
Pt arrives to ED with c/o SOB x3 weeks. She notes her SpO2 was 71% on room air this morning. Associated with productive cough and DOE.

## 2023-01-11 NOTE — ED Notes (Signed)
Called [facility] -- informed that the patient bed assignment is ready. Transport will be en route as soon as possible.

## 2023-01-11 NOTE — ED Provider Notes (Cosign Needed Addendum)
[place] [facility]ERGENCY DEPARTMENT AT [facility] Provider Note   CSN: [id number] Arrival date & time: 01/11/23  0944     History  Chief Complaint  Patient presents with   Shortness of Breath    Theresa [name] is a 67 y.o. female who  has a past medical history of Breast cancer, right (HCC), Contact lens/glasses fitting, Diarrhea, Family history of bone cancer, Family history of brain cancer, Family history of stomach cancer, Family history of uterine cancer, Fibroids, History of radiation therapy, Hypertension, No pertinent past medical history, Personal history of radiation therapy, PONV (postoperative nausea and vomiting), Right ovarian cyst, and Vertigo. Presents with sob and coughing x 3 weeks.  Severe DOE. No pleuritic or other cp. No edema. Patient has had sxs of URI. C/o pain with coughing.  Friend at bedside reports she came over to check on her and found her to be lethargic. Pulse ox was 71% on room air. Friend got her to breat deeply and brought it to 91% but told her that she needed to go to the ER. She is s/p radiation for metastasis/ pathologic fracture of T9 spine.. States pain is minimal  and improved with motrin.   Shortness of Breath      Home Medications Prior to Admission medications   Medication Sig Start Date End Date Taking? Authorizing Provider  abemaciclib (VERZENIO) 100 MG tablet Take 1 tablet (100 mg total) by mouth 2 (two) times daily. 12/09/22   Serena Croissant, MD  gabapentin (NEURONTIN) 300 MG capsule Take 1 capsule (300 mg total) by mouth at bedtime. 06/30/22   Serena Croissant, MD  hydrocortisone 2.5 % cream Apply topically 3 (three) times daily as needed. Patient not taking: Reported on 11/03/2022 12/11/19   Lonie Peak, MD  letrozole [facility]) 2.5 MG tablet TAKE 1 TABLET BY MOUTH EVERY DAY 10/23/22   Serena Croissant, MD  loperamide (IMODIUM) 2 MG capsule Take 2 mg by mouth in the morning and at bedtime.     [provider]  LORazepam (ATIVAN) 0.5  MG tablet TAKE 1 TABLET BY MOUTH EVERY 8 HOURS AS NEEDED FOR ANXIETY. 12/15/22   Serena Croissant, MD  methocarbamol (ROBAXIN) 500 MG tablet Take 500 mg by mouth 2 (two) times daily as needed for muscle spasms. Patient not taking: Reported on 12/04/2022    [provider]  ondansetron (ZOFRAN) 8 MG tablet Take 1 tablet (8 mg total) by mouth every 8 (eight) hours as needed for nausea or vomiting. 11/03/22   Serena Croissant, MD  oxyCODONE-acetaminophen (PERCOCET/ROXICET) 5-325 MG tablet Take 1 tablet by mouth every 6 (six) hours as needed for severe pain. 12/16/22   Serena Croissant, MD  prochlorperazine (COMPAZINE) 10 MG tablet Take 1 tablet (10 mg total) by mouth every 6 (six) hours as needed for nausea or vomiting. 11/03/22   Serena Croissant, MD  triamcinolone (KENALOG) 0.025 % ointment Apply 1 Application topically 2 (two) times daily. Use for one week at a time. Patient not taking: Reported on 11/03/2022 07/08/22   Patton Salles, MD  valsartan (DIOVAN) 40 MG tablet Take by mouth. 05/06/21   [provider]  venlafaxine XR (EFFEXOR-XR) 37.5 MG 24 hr capsule Take 1 capsule (37.5 mg total) by mouth daily. 02/19/22   Rachel Moulds, MD      Allergies    Metronidazole, Other, and Tape    Review of Systems   Review of Systems  Respiratory:  Positive for shortness of breath.  Physical Exam Updated Vital Signs BP 139/74 (BP Location: Right Arm)   Pulse (!) 102   Temp 98.7 F (37.1 C) (Oral)   Resp 16   Ht 5\' 5"  (1.651 m)   Wt 63.5 kg   LMP 09/01/2007 (Exact Date)   SpO2 (!) 88%   BMI 23.30 kg/m  Physical Exam Vitals and nursing note reviewed.  Constitutional:      General: She is not in acute distress.    Appearance: She is well-developed. She is ill-appearing. She is not diaphoretic.     Comments: Appears weak  HENT:     Head: Normocephalic and atraumatic.     Right Ear: External ear normal.     Left Ear: External ear normal.     Nose: Nose normal.     Mouth/Throat:      Mouth: Mucous membranes are moist.  Eyes:     General: No scleral icterus.    Extraocular Movements: Extraocular movements intact.     Conjunctiva/sclera: Conjunctivae normal.     Pupils: Pupils are equal, round, and reactive to light.  Cardiovascular:     Rate and Rhythm: Regular rhythm. Tachycardia present.     Heart sounds: Normal heart sounds. No murmur heard.    No friction rub. No gallop.  Pulmonary:     Effort: No respiratory distress.     Breath sounds: Normal breath sounds. Decreased air movement present. No decreased breath sounds, wheezing, rhonchi or rales.     Comments: Splint, poor effort and very shallow breathing.  Able to draw deep breaths with normal breath sounds on when prompted. Abdominal:     General: Bowel sounds are normal. There is distension.     Palpations: Abdomen is soft. There is no mass.     Tenderness: There is no abdominal tenderness. There is no guarding.     Comments: Abd firm with mild distension  Musculoskeletal:     Cervical back: Normal range of motion.  Skin:    General: Skin is warm and dry.  Neurological:     Mental Status: She is alert and oriented to person, place, and time.  Psychiatric:        Behavior: Behavior normal.     ED Results / Procedures / Treatments   Labs (all labs ordered are listed, but only abnormal results are displayed) Labs Reviewed  SARS CORONAVIRUS 2 BY RT PCR  CBC WITH DIFFERENTIAL/PLATELET  BRAIN NATRIURETIC PEPTIDE  COMPREHENSIVE METABOLIC PANEL  LIPASE, BLOOD  I-STAT VENOUS BLOOD GAS, ED    EKG None  Radiology No results found.  Procedures .Critical Care  Performed by: Arthor Captain, PA-C Authorized by: Arthor Captain, PA-C   Critical care provider statement:    Critical care time (minutes):  50   Critical care time was exclusive of:  Separately billable procedures and treating other patients   Critical care was necessary to treat or prevent imminent or life-threatening deterioration  of the following conditions:  Respiratory failure   Critical care was time spent personally by me on the following activities:  Development of treatment plan with patient or surrogate, discussions with consultants, evaluation of patient's response to treatment, examination of patient, ordering and review of laboratory studies, ordering and review of radiographic studies, ordering and performing treatments and interventions, pulse oximetry, re-evaluation of patient's condition and review of old charts     Medications Ordered in ED Medications  sodium chloride 0.9 % bolus 1,000 mL (has no administration in time range)  sodium chloride  0.9 % bolus 1,000 mL (has no administration in time range)    ED Course/ Medical Decision Making/ A&P Clinical Course as of 01/11/23 1121  Mon Jan 11, 2023  1022 SpO2(!): 88 % > 90 on 2L [place] [AH]  1023 pO2, Ven(!!): 29 VBG with low 02 [AH]  1112 Hemoglobin(!): 10.1 [AH]  1112 Platelets(!): 40 [AH]  1114 Lipase(!): 85 [AH]    Clinical Course User Index [AH] Arthor Captain, PA-C                             Medical Decision Making Amount and/or Complexity of Data Reviewed Labs: ordered. Decision-making details documented in ED Course. Radiology: ordered.  Risk Prescription drug management. Decision regarding hospitalization.   This patient presents to the ED for concern of sob, this involves an extensive number of treatment options, and is a complaint that carries with it a high risk of complications and morbidity.  The emergent differential diagnosis for shortness of breath includes, but is not limited to, Pulmonary edema, bronchoconstriction, Pneumonia, Pulmonary embolism, Pneumotherax/ Hemothorax, Dysrythmia, ACS.    Co morbidities: metastatic breast cancer  Social Determinants of Health:  good support system  Additional history:  {Additional history obtained from friend at bedside {External records from outside source obtained and  reviewed including oncology records  Lab Tests:  I Ordered, and personally interpreted labs.  The pertinent results include:   Hemoglobin 10.1 baseline Platelet 40 << 67 VBG pO2 29 Lipase 85 INR 1.4 AST 193 >>137 ALT 50 >>36 ALP 324>> 186 Tbili 1.7>>0.6 Imaging Studies:  I ordered imaging studies including cxr, cta pe I independently visualized and interpreted imaging which showed Ground glass opacity (hepatomegaly- RUQ [place] ordered), new T10 endplate fracture with 10% height loss I agree with the radiologist interpretation  Cardiac Monitoring/ECG:  The patient was maintained on a cardiac monitor.  I personally viewed and interpreted the cardiac monitored which showed an underlying rhythm of: sinus tach  Medicines ordered and prescription drug management:  I ordered medication including  Medications  cefTRIAXone (ROCEPHIN) 1 g in sodium chloride 0.9 % 100 mL IVPB (1 g Intravenous New Bag/Given 01/11/23 1144)  azithromycin (ZITHROMAX) 500 mg in sodium chloride 0.9 % 250 mL IVPB (has no administration in time range)  sodium chloride 0.9 % bolus 1,000 mL (1,000 mLs Intravenous New Bag/Given 01/11/23 1142)  sodium chloride 0.9 % bolus 1,000 mL (1,000 mLs Intravenous New Bag/Given 01/11/23 1018)  iohexol (OMNIPAQUE) 350 MG/ML injection 100 mL (75 mLs Intravenous Contrast Given 01/11/23 1045)   for CAP, sob Reevaluation of the patient after these medicines showed that the patient improved I have reviewed the patients home medicines and have made adjustments as needed  Test Considered:   considered CT abdomen pelvis with contrast to rule out abdominal mass however patient has recently gotten a contrast load will need some time for another 1 is given.  Critical Interventions:   fluids, oxygen  Consultations Obtained: Dr. Erenest Blank [facility] who will admit the patient  Problem List / ED Course:     ICD-10-CM   1. Acute respiratory failure with hypoxia (HCC)  J96.01      2. Elevated liver enzymes  R74.8     3. Pathological fracture of vertebra due to neoplastic disease, initial encounter  M84.58XA     4. Macrocytic anemia  D53.9     5. Thrombocytopenia (HCC)  D69.6     6. Ground glass  opacity present on imaging of lung  R91.8       MDM: Patient with acute hypoxic respiratory failure comorbidities and metastatic cancer suspect community-acquired pneumonia, given IV antibiotics.  Patient appears to have a worsening and new thrombocytopenia and hepatomegaly on my examination on review of CT images.  Right upper quadrant ultrasound pending.  Patient will need hospital admission for hypoxia and treatment of her   Dispostion:  After consideration of the diagnostic results and the patients response to treatment, I feel that the patent would benefit from admission    Final Clinical Impression(s) / ED Diagnoses Final diagnoses:  Acute respiratory failure with hypoxia (HCC)  Elevated liver enzymes  Pathological fracture of vertebra due to neoplastic disease, initial encounter  Macrocytic anemia  Thrombocytopenia (HCC)  Ground glass opacity present on imaging of lung    Rx / DC Orders ED Discharge Orders     None         Arthor Captain, PA-C 01/11/23 1208    Arthor Captain, PA-C 01/11/23 1536    Zadie Rhine, MD 01/14/23 0140

## 2023-01-11 NOTE — Progress Notes (Signed)
Orthopedic Tech Progress Note Patient Details:  Theresa [name] September 17, 1955 [id number]  Ortho Devices Type of Ortho Device: Thoracolumbar corset (TLSO) Ortho Device/Splint Interventions: Ordered, Adjustment   Post Interventions Patient Tolerated: Well Instructions Provided: Adjustment of device, Care of device  [name] 01/11/2023, 4:29 PM

## 2023-01-11 NOTE — Plan of Care (Signed)
Plan of Care Note for Accepted Transfer   Patient: Theresa [name]    EAV:[id number]     Facility requesting transfer: [facility] Requesting Provider: Arthor Captain, PA-C  [phone] with history of metastatic breast cancer admitted with acute hypoxic respiratory failure and LFT elevation. Stable on 2L [place] oxygen, given IV Azithromycin and Rocephin and Liver US pending.  Most recent vitals, labs and radiology:  Blood pressure 139/74, pulse (!) 102, temperature 98.7 F (37.1 C), temperature source Oral, resp. rate 16, height 5\' 5"  (1.651 m), weight 63.5 kg, last menstrual period 09/01/2007, SpO2 (!) 88 %.      Latest Ref Rng & Units 01/11/2023   10:15 AM 01/11/2023   10:11 AM 12/16/2022   11:01 AM  CBC  WBC 4.0 - 10.5 K/uL  4.9  5.8   Hemoglobin 12.0 - 15.0 g/dL 95.6  21.3  08.6   Hematocrit 36.0 - 46.0 % 33.0  30.8  30.8   Platelets 150 - 400 K/uL  40  67       Latest Ref Rng & Units 01/11/2023   10:15 AM 01/11/2023   10:11 AM 12/16/2022   11:01 AM  BMP  Glucose 70 - 99 mg/dL  578  469   BUN 8 - 23 mg/dL  14  15   Creatinine 6.29 - 1.00 mg/dL  5.28  4.13   Sodium 244 - 145 mmol/L 135  136  133   Potassium 3.5 - 5.1 mmol/L 4.6  4.6  4.4   Chloride 98 - 111 mmol/L  104  101   CO2 22 - 32 mmol/L  21  26   Calcium 8.9 - 10.3 mg/dL  9.2  01.0      DG Chest Port 1 View  Result Date: 01/11/2023 CLINICAL DATA:  Shortness of breath, undergoing treatment for breast cancer. EXAM: PORTABLE CHEST 1 VIEW COMPARISON:  11/26/2022 FINDINGS: Cardiac and mediastinal contours are within normal limits given AP technique. No focal pulmonary opacity. No pleural effusion or pneumothorax. No acute osseous abnormality. IMPRESSION: No acute cardiopulmonary process. Electronically Signed   By: Wiliam Ke M.D.   On: 01/11/2023 11:16   CT Angio Chest PE W and/or Wo Contrast  Result Date: 01/11/2023 CLINICAL DATA:  Rule out pulmonary embolism.  High probability. EXAM: CT ANGIOGRAPHY CHEST WITH CONTRAST TECHNIQUE:  Multidetector CT imaging of the chest was performed using the standard protocol during bolus administration of intravenous contrast. Multiplanar CT image reconstructions and MIPs were obtained to evaluate the vascular anatomy. RADIATION DOSE REDUCTION: This exam was performed according to the departmental dose-optimization program which includes automated exposure control, adjustment of the mA and/or kV according to patient size and/or use of iterative reconstruction technique. CONTRAST:  75mL OMNIPAQUE IOHEXOL 350 MG/ML SOLN COMPARISON:  11/26/2022 FINDINGS: Cardiovascular: Satisfactory opacification of the pulmonary arteries to the segmental level. No evidence of pulmonary embolism. Normal heart size. No pericardial effusion. Mediastinum/Nodes: Thyroid gland, trachea and esophagus are unremarkable. Prominent subcarinal node measures 1.5 cm, image 113/5. Previously this measured 1.6 cm. Right hilar lymph node measures 1.5 cm, image 118/5. Unchanged from previous exam. No enlarged axillary lymph nodes. Lungs/Pleura: There is no pleural effusion identified. No airspace consolidation or pneumothorax. Stable solid nodule within the periphery of the left upper lobe measuring 4 mm. Mild patchy areas of ground-glass attenuation are noted within both lungs. Upper Abdomen: No acute abnormality. Again seen are geographic areas of fatty infiltration within the right hepatic lobe. Musculoskeletal: Multifocal lucent bone lesions are identified  compatible with metastatic diseaseInterval cement augmentation of the T9 compression deformity. There is a new, mild anterior inferior endplate compression deformity involving the T10 vertebra with loss of approximately 10% of the vertebral body height, image 78/8. No retropulsion of fracture fragments identified. Review of the MIP images confirms the above findings. IMPRESSION: 1. No evidence for acute pulmonary embolus. 2. Mild patchy areas of ground-glass attenuation are noted within  both lungs. This is a nonspecific finding and may be seen with small airways inflammation or infection. 3. Interval cement augmentation of the T9 compression deformity. There is a new, mild anterior inferior endplate compression deformity involving the T10 vertebra with loss of approximately 10% of the vertebral body height. No retropulsion of fracture fragments identified. 4. Multifocal lucent bone lesions compatible with metastatic disease. Electronically Signed   By: Signa Kell M.D.   On: 01/11/2023 11:13     The patient has been accepted for transfer to [facility] or [facility], depending on bed and resource availability. The patient will remain under the care and responsibility of the referring provider until they have arrived to our inpatient facility.  Author: Keeya Dyckman Sharlette Dense, MD  01/11/2023  Check [email or web address] for on-call coverage.  Nursing staff, Please call [facility] Admits & Consults System-Wide number on Amion as soon as patient's arrival, so appropriate admitting provider can evaluate the pt.

## 2023-01-12 ENCOUNTER — Inpatient Hospital Stay (HOSPITAL_COMMUNITY): Payer: Medicare HMO

## 2023-01-12 ENCOUNTER — Ambulatory Visit: Payer: Medicare HMO

## 2023-01-12 ENCOUNTER — Encounter (HOSPITAL_COMMUNITY): Payer: Self-pay | Admitting: Internal Medicine

## 2023-01-12 ENCOUNTER — Other Ambulatory Visit: Payer: Self-pay

## 2023-01-12 ENCOUNTER — Ambulatory Visit
Admission: RE | Admit: 2023-01-12 | Discharge: 2023-01-12 | Disposition: A | Discharge status: Home / Self Care | Payer: Medicare HMO | Source: Ambulatory Visit | Attending: Radiation Oncology | Admitting: Radiation Oncology

## 2023-01-12 DIAGNOSIS — Z9221 Personal history of antineoplastic chemotherapy: Secondary | ICD-10-CM | POA: Diagnosis not present

## 2023-01-12 DIAGNOSIS — C7951 Secondary malignant neoplasm of bone: Secondary | ICD-10-CM | POA: Diagnosis present

## 2023-01-12 DIAGNOSIS — J9621 Acute and chronic respiratory failure with hypoxia: Secondary | ICD-10-CM | POA: Diagnosis present

## 2023-01-12 DIAGNOSIS — E222 Syndrome of inappropriate secretion of antidiuretic hormone: Secondary | ICD-10-CM | POA: Diagnosis present

## 2023-01-12 DIAGNOSIS — C787 Secondary malignant neoplasm of liver and intrahepatic bile duct: Secondary | ICD-10-CM | POA: Diagnosis present

## 2023-01-12 DIAGNOSIS — Z808 Family history of malignant neoplasm of other organs or systems: Secondary | ICD-10-CM | POA: Diagnosis not present

## 2023-01-12 DIAGNOSIS — Z79811 Long term (current) use of aromatase inhibitors: Secondary | ICD-10-CM | POA: Diagnosis not present

## 2023-01-12 DIAGNOSIS — Z8249 Family history of ischemic heart disease and other diseases of the circulatory system: Secondary | ICD-10-CM | POA: Diagnosis not present

## 2023-01-12 DIAGNOSIS — J189 Pneumonia, unspecified organism: Secondary | ICD-10-CM | POA: Diagnosis present

## 2023-01-12 DIAGNOSIS — I2729 Other secondary pulmonary hypertension: Secondary | ICD-10-CM | POA: Diagnosis not present

## 2023-01-12 DIAGNOSIS — D6959 Other secondary thrombocytopenia: Secondary | ICD-10-CM | POA: Diagnosis present

## 2023-01-12 DIAGNOSIS — R627 Adult failure to thrive: Secondary | ICD-10-CM | POA: Diagnosis present

## 2023-01-12 DIAGNOSIS — Z1152 Encounter for screening for COVID-19: Secondary | ICD-10-CM | POA: Diagnosis not present

## 2023-01-12 DIAGNOSIS — Z800 Family history of malignant neoplasm of digestive organs: Secondary | ICD-10-CM | POA: Diagnosis not present

## 2023-01-12 DIAGNOSIS — D539 Nutritional anemia, unspecified: Secondary | ICD-10-CM | POA: Diagnosis present

## 2023-01-12 DIAGNOSIS — D61818 Other pancytopenia: Secondary | ICD-10-CM | POA: Diagnosis not present

## 2023-01-12 DIAGNOSIS — Z923 Personal history of irradiation: Secondary | ICD-10-CM | POA: Diagnosis not present

## 2023-01-12 DIAGNOSIS — Z87891 Personal history of nicotine dependence: Secondary | ICD-10-CM | POA: Diagnosis not present

## 2023-01-12 DIAGNOSIS — Z79899 Other long term (current) drug therapy: Secondary | ICD-10-CM | POA: Diagnosis not present

## 2023-01-12 DIAGNOSIS — C50311 Malignant neoplasm of lower-inner quadrant of right female breast: Secondary | ICD-10-CM | POA: Diagnosis present

## 2023-01-12 DIAGNOSIS — I2721 Secondary pulmonary arterial hypertension: Secondary | ICD-10-CM | POA: Diagnosis present

## 2023-01-12 DIAGNOSIS — F419 Anxiety disorder, unspecified: Secondary | ICD-10-CM | POA: Diagnosis present

## 2023-01-12 DIAGNOSIS — J159 Unspecified bacterial pneumonia: Secondary | ICD-10-CM | POA: Diagnosis present

## 2023-01-12 DIAGNOSIS — Z8049 Family history of malignant neoplasm of other genital organs: Secondary | ICD-10-CM | POA: Diagnosis not present

## 2023-01-12 DIAGNOSIS — J9601 Acute respiratory failure with hypoxia: Secondary | ICD-10-CM | POA: Diagnosis not present

## 2023-01-12 DIAGNOSIS — I10 Essential (primary) hypertension: Secondary | ICD-10-CM | POA: Diagnosis present

## 2023-01-12 DIAGNOSIS — Z66 Do not resuscitate: Secondary | ICD-10-CM | POA: Diagnosis present

## 2023-01-12 DIAGNOSIS — R0602 Shortness of breath: Secondary | ICD-10-CM | POA: Diagnosis present

## 2023-01-12 LAB — COMPREHENSIVE METABOLIC PANEL
ALT: 49 U/L — ABNORMAL HIGH (ref 0–44)
AST: 189 U/L — ABNORMAL HIGH (ref 15–41)
Albumin: 2.9 g/dL — ABNORMAL LOW (ref 3.5–5.0)
Alkaline Phosphatase: 277 U/L — ABNORMAL HIGH (ref 38–126)
Anion gap: 8 (ref 5–15)
BUN: 10 mg/dL (ref 8–23)
CO2: 20 mmol/L — ABNORMAL LOW (ref 22–32)
Calcium: 8.1 mg/dL — ABNORMAL LOW (ref 8.9–10.3)
Chloride: 106 mmol/L (ref 98–111)
Creatinine, Ser: 0.85 mg/dL (ref 0.44–1.00)
GFR, Estimated: >60 mL/min (ref >60)
Glucose, Bld: 100 mg/dL — ABNORMAL HIGH (ref 70–99)
Potassium: 4 mmol/L (ref 3.5–5.1)
Sodium: 134 mmol/L — ABNORMAL LOW (ref 135–145)
Total Bilirubin: 1.9 mg/dL — ABNORMAL HIGH (ref 0.3–1.2)
Total Protein: 6.1 g/dL — ABNORMAL LOW (ref 6.5–8.1)

## 2023-01-12 LAB — CBC
HCT: 27.8 % — ABNORMAL LOW (ref 36.0–46.0)
Hemoglobin: 8.8 g/dL — ABNORMAL LOW (ref 12.0–15.0)
MCH: 32.7 pg (ref 26.0–34.0)
MCHC: 31.7 g/dL (ref 30.0–36.0)
MCV: 103.3 fL — ABNORMAL HIGH (ref 80.0–100.0)
Platelets: 35 10*3/uL — ABNORMAL LOW (ref 150–400)
RBC: 2.69 MIL/uL — ABNORMAL LOW (ref 3.87–5.11)
RDW: 21.4 % — ABNORMAL HIGH (ref 11.5–15.5)
WBC: 4.6 10*3/uL (ref 4.0–10.5)
nRBC: 9.3 % — ABNORMAL HIGH (ref 0.0–0.2)

## 2023-01-12 LAB — RAD ONC ARIA SESSION SUMMARY
Course Elapsed Days: 6
Plan Fractions Treated to Date: 4
Plan Prescribed Dose Per Fraction: 3 Gy
Plan Total Fractions Prescribed: 10
Plan Total Prescribed Dose: 30 Gy
Reference Point Dosage Given to Date: 12 Gy
Reference Point Session Dosage Given: 3 Gy
Session Number: 4

## 2023-01-12 LAB — HIV ANTIBODY (ROUTINE TESTING W REFLEX): HIV Screen 4th Generation wRfx: NONREACTIVE

## 2023-01-12 MED ORDER — LOPERAMIDE HCL 2 MG PO CAPS
2.0000 mg | ORAL_CAPSULE | ORAL | Status: DC | PRN
  Administered 2023-01-12 (×3): 2 mg via ORAL
  Filled 2023-01-12 (×3): qty 1

## 2023-01-12 MED ORDER — IOHEXOL 300 MG/ML  SOLN
100.0000 mL | Freq: Once | INTRAMUSCULAR | Status: AC | PRN
  Administered 2023-01-12: 100 mL via INTRAVENOUS

## 2023-01-12 MED ORDER — ENSURE ENLIVE PO LIQD
237.0000 mL | Freq: Two times a day (BID) | ORAL | Status: DC
  Administered 2023-01-12 – 2023-01-14 (×4): 237 mL via ORAL

## 2023-01-12 MED ORDER — IOHEXOL 9 MG/ML PO SOLN
ORAL | Status: AC
  Filled 2023-01-12: qty 1000

## 2023-01-12 MED ORDER — SODIUM CHLORIDE (PF) 0.9 % IJ SOLN
INTRAMUSCULAR | Status: AC
  Filled 2023-01-12: qty 50

## 2023-01-12 MED ORDER — IOHEXOL 9 MG/ML PO SOLN
500.0000 mL | ORAL | Status: AC
  Administered 2023-01-12 (×2): 500 mL via ORAL

## 2023-01-12 NOTE — Progress Notes (Addendum)
Initial Nutrition Assessment  INTERVENTION:   -Ensure Plus High Protein po BID, each supplement provides 350 kcal and 20 grams of protein.   NUTRITION DIAGNOSIS:   Increased nutrient needs related to cancer and cancer related treatments as evidenced by estimated needs.  GOAL:   Patient will meet greater than or equal to 90% of their needs  MONITOR:   PO intake, Supplement acceptance, Labs, Weight trends, I & O's  REASON FOR ASSESSMENT:   Consult Poor PO  ASSESSMENT:   67 y.o. female with medical history significant for breast cancer status post lumpectomy, chemotherapy, radiation with known liver and spinal metastases being admitted to the hospital with atypical pneumonia, new T10 pathologic compression fracture and worsening LFTs concerning for worsening metastatic liver lesions.  Patient in room, multiple visitors in room.  Pt seemed not very open to RD visit, pt stated upon entering "You're not going to get much from me".  With the help of the visitors at bedside, pt was reported to have poor appetite for 2-3 weeks ago since developing a cough. Pt has noticed increased weakness and needs assistance out of bed now. Noted that patient has T10 compression fracture. Pt denies any swallowing or chewing difficulties. Main complaint is losing interest in eating. May eat bites of meals then be too full to continue. Pt did consume some breakfast this morning, eggs, toast and juice. Now having to drink contrast for CT. Pt agreeable to drinking Ensure later following scan.   Per patient UBW ~ 150 lbs. Current weight: 146 lbs  Medications: Pepcid, Imodium, Zofran  Labs reviewed: Low Na   NUTRITION - FOCUSED PHYSICAL EXAM:  Deferred   Diet Order:   Diet Order             Diet regular Room service appropriate? Yes; Fluid consistency: Thin  Diet effective now                   EDUCATION NEEDS:   No education needs have been identified at this time  Skin:  Skin  Assessment: Reviewed RN Assessment  Last BM:  5/13  Height:   Ht Readings from Last 1 Encounters:  01/11/23 5\' 5"  (1.651 m)    Weight:   Wt Readings from Last 1 Encounters:  01/11/23 66.4 kg    BMI:  Body mass index is 24.36 kg/m.  Estimated Nutritional Needs:   Kcal:  1800-2000  Protein:  90-100g  Fluid:  2L/day  Tilda Franco, MS, RD, LDN Inpatient Clinical Dietitian Contact information available via Amion

## 2023-01-12 NOTE — Progress Notes (Signed)
Mobility Specialist - Progress Note   01/12/23 1435  Mobility  Activity Ambulated independently in hallway  Level of Assistance Independent  Assistive Device None  Distance Ambulated (ft) 160 ft  Activity Response Tolerated well  Mobility Referral Yes  $Mobility charge 1 Mobility  Mobility Specialist Start Time (ACUTE ONLY) 0223  Mobility Specialist Stop Time (ACUTE ONLY) 0234  Mobility Specialist Time Calculation (min) (ACUTE ONLY) 11 min   Pt received in bed and agreeable to mobility. During ambulation pt desat to 81%. Encouraged a standing rest break w/ pursed lip breaths bringing O2 back up to 88%.  Pt c/o feeling SOB, but stated she was okay to ambulate back to her room. Pt to bed after session with all needs met.    Pre-mobility: 104 HR, 92%  SpO2 During mobility: 111, 81%-88% SpO2 Post-mobility: 109 HR, 90%  SPO2  Chief Technology Officer

## 2023-01-12 NOTE — Progress Notes (Signed)
PROGRESS NOTE    Theresa [name]  AVW:[id number] DOB: 1956/02/28 DOA: 01/11/2023 PCP: Carylon Perches, MD    Brief Narrative:  67 year old with history of breast cancer and metastasis currently on chemotherapy, known radiation, known liver and spinal metastasis presents to the emergency room with shortness of breath, feeling poorly for the last many weeks.  Presented to the emergency room complaining of cough and shortness of breath, pulse ox was 71% on room air.  Improved with 2 L of oxygen.  Thrombocytopenia otherwise fairly stable.  BMP unremarkable.  Chest x-ray consistent with atypical pneumonia, CT scan negative for pulmonary embolism but consistent with groundglass opacities.  Admitted with IV antibiotics and oxygen.   Assessment & Plan:   Bilateral groundglass opacities/multifocal pneumonia: Hypoxemia Agree with admission given severity of symptoms. Antibiotics to treat bacterial pneumonia, will continue Rocephin azithromycin. Chest physiotherapy, incentive spirometry, deep breathing exercises, sputum induction, mucolytic's and bronchodilators. Sputum cultures, blood cultures, Legionella and streptococcal antigen. Supplemental oxygen to keep saturations more than 90%. CT angiogram consistent with patchy areas of groundglass attenuation in both lungs, not sure this is secondary to her chemotherapy.  Will discuss with oncology whether patient may benefit with a steroid trial.  Metastatic breast cancer: Currently on letrozole and Verzenio.  Holding.  Dr. Pamelia Hoit notified and he will follow-up.  Abnormal LFTs and hepatomegaly: Due to metastatic disease.  Back pain, T9 compression fracture status post kyphoplasty.  T10 pathological compression fracture. Symptomatic management.  Mobility.  Pain medications.   DVT prophylaxis: SCDs Start: 01/11/23 1433   Code Status: Full code Family Communication: 1 sister, 3 friends at the bedside Disposition Plan: Status is: Inpatient Remains inpatient  appropriate because: Significant symptoms, hypoxemia, IV antibiotics     Consultants:  Oncology, notified  Procedures:  None  Antimicrobials:  Rocephin and azithromycin 5/13---   Subjective: Patient seen and examined.  Multiple family members at the bedside.  At rest she feels okay.  Nursing staff reported obvious short of breath on minimal mobility.  Currently on 2 L oxygen.  Mostly dry cough.  Objective: Vitals:   01/12/23 0145 01/12/23 0202 01/12/23 0535 01/12/23 0730  BP: 119/62  127/68   Pulse: (!) 101 98    Resp: 16  16   Temp: 97.7 F (36.5 C)  (!) 97.5 F (36.4 C)   TempSrc: Oral  Oral   SpO2: (!) 88% 97% (!) 74% 92%  Weight:      Height:        Intake/Output Summary (Last 24 hours) at 01/12/2023 1329 Last data filed at 01/12/2023 0847 Gross per 24 hour  Intake 240 ml  Output --  Net 240 ml   Filed Weights   01/11/23 0951 01/11/23 1312  Weight: 63.5 kg 66.4 kg    Examination:  General exam: Appears calm and comfortable, appropriately anxious. Respiratory system: No appreciable extra breathing sounds.  Comfortable at rest.  On 2 L oxygen. Cardiovascular system: S1 & S2 heard, RRR. No pedal edema. Gastrointestinal system: Soft.  Nontender.  Bowel sound present. Central nervous system: Alert and oriented. No focal neurological deficits. Extremities: Symmetric 5 x 5 power.    Data Reviewed: I have personally reviewed following labs and imaging studies  CBC: Recent Labs  Lab 01/11/23 1011 01/11/23 1015 01/12/23 0552  WBC 4.9  --  4.6  NEUTROABS 2.7  --   --   HGB 10.1* 11.2* 8.8*  HCT 30.8* 33.0* 27.8*  MCV 101.3*  --  103.3*  PLT 40*  --  35*   Basic Metabolic Panel: Recent Labs  Lab 01/11/23 1011 01/11/23 1015 01/12/23 0552  NA 136 135 134*  K 4.6 4.6 4.0  CL 104  --  106  CO2 21*  --  20*  GLUCOSE 104*  --  100*  BUN 14  --  10  CREATININE 0.94  --  0.85  CALCIUM 9.2  --  8.1*   GFR: Estimated Creatinine Clearance: 59.4 mL/min  (by C-G formula based on SCr of 0.85 mg/dL). Liver Function Tests: Recent Labs  Lab 01/11/23 1011 01/12/23 0552  AST 193* 189*  ALT 50* 49*  ALKPHOS 324* 277*  BILITOT 1.7* 1.9*  PROT 6.6 6.1*  ALBUMIN 3.8 2.9*   Recent Labs  Lab 01/11/23 1011  LIPASE 85*   No results for input(s): "AMMONIA" in the last 168 hours. Coagulation Profile: Recent Labs  Lab 01/11/23 1114  INR 1.4*   Cardiac Enzymes: No results for input(s): "CKTOTAL", "CKMB", "CKMBINDEX", "TROPONINI" in the last 168 hours. BNP (last 3 results) No results for input(s): "PROBNP" in the last 8760 hours. HbA1C: No results for input(s): "HGBA1C" in the last 72 hours. CBG: No results for input(s): "GLUCAP" in the last 168 hours. Lipid Profile: No results for input(s): "CHOL", "HDL", "LDLCALC", "TRIG", "CHOLHDL", "LDLDIRECT" in the last 72 hours. Thyroid Function Tests: No results for input(s): "TSH", "T4TOTAL", "FREET4", "T3FREE", "THYROIDAB" in the last 72 hours. Anemia Panel: No results for input(s): "VITAMINB12", "FOLATE", "FERRITIN", "TIBC", "IRON", "RETICCTPCT" in the last 72 hours. Sepsis Labs: No results for input(s): "PROCALCITON", "LATICACIDVEN" in the last 168 hours.  Recent Results (from the past 240 hour(s))  SARS Coronavirus 2 by RT PCR (hospital order, performed in [facility] hospital lab) *cepheid single result test* Anterior Nasal Swab     Status: None   Collection Time: 01/11/23 10:11 AM   Specimen: Anterior Nasal Swab  Result Value Ref Range Status   SARS Coronavirus 2 by RT PCR NEGATIVE NEGATIVE Final    Comment: (NOTE) SARS-CoV-2 target nucleic acids are NOT DETECTED.  The SARS-CoV-2 RNA is generally detectable in upper and lower respiratory specimens during the acute phase of infection. The lowest concentration of SARS-CoV-2 viral copies this assay can detect is 250 copies / mL. A negative result does not preclude SARS-CoV-2 infection and should not be used as the sole basis for  treatment or other patient management decisions.  A negative result may occur with improper specimen collection / handling, submission of specimen other than nasopharyngeal swab, presence of viral mutation(s) within the areas targeted by this assay, and inadequate number of viral copies (<250 copies / mL). A negative result must be combined with clinical observations, patient history, and epidemiological information.  Fact Sheet for Patients:   RoadLapTop.co.za  Fact Sheet for Healthcare Providers: http://kim-miller.com/  This test is not yet approved or  cleared by the [place] FDA and has been authorized for detection and/or diagnosis of SARS-CoV-2 by FDA under an Emergency Use Authorization (EUA).  This EUA will remain in effect (meaning this test can be used) for the duration of the COVID-19 declaration under Section 564(b)(1) of the Act, 21 U.S.C. section 360bbb-3(b)(1), unless the authorization is terminated or revoked sooner.  Performed at [facility], [address], [place], [place] [address]          Radiology Studies: US ABDOMEN LIMITED RUQ (LIVER/GB)  Result Date: 01/11/2023 CLINICAL DATA:  Transaminitis.  History of breast cancer. EXAM: ULTRASOUND ABDOMEN LIMITED RIGHT UPPER QUADRANT COMPARISON:  CT abdomen pelvis 09/29/2022. PET-CT scan 10/08/2022 FINDINGS: Gallbladder: Previous cholecystectomy. Common bile duct: Diameter: 4 mm. Liver: Heterogeneous liver with multiple heterogeneous hypoechoic mass lesions. Example left hepatic lobe measures 3.4 x 2.1 x 3.6 cm. Right hepatic lobe 4.9 x 3.2 x 4.3 cm. There also some linear echogenic areas scattered in the liver. These of uncertain etiology but could represent some biliary air. Please correlate for any known history. Portal vein is patent on color Doppler imaging with normal direction of blood flow towards the liver. Other: Trace ascites IMPRESSION:  Previous cholecystectomy. No biliary ductal dilatation. Question some pneumobilia in the right hepatic lobe with some linear echogenic areas. However, on the CT scan of the chest from 01/11/2023 earlier than there was no air in the biliary tree. Known liver metastases, please correlate with prior workup. Overall with changes today a contrast CT of the abdomen and pelvis may be of some benefit when clinically appropriate. Electronically Signed   By: Karen Kays M.D.   On: 01/11/2023 12:13   DG Chest Port 1 View  Result Date: 01/11/2023 CLINICAL DATA:  Shortness of breath, undergoing treatment for breast cancer. EXAM: PORTABLE CHEST 1 VIEW COMPARISON:  11/26/2022 FINDINGS: Cardiac and mediastinal contours are within normal limits given AP technique. No focal pulmonary opacity. No pleural effusion or pneumothorax. No acute osseous abnormality. IMPRESSION: No acute cardiopulmonary process. Electronically Signed   By: Wiliam Ke M.D.   On: 01/11/2023 11:16   CT Angio Chest PE W and/or Wo Contrast  Result Date: 01/11/2023 CLINICAL DATA:  Rule out pulmonary embolism.  High probability. EXAM: CT ANGIOGRAPHY CHEST WITH CONTRAST TECHNIQUE: Multidetector CT imaging of the chest was performed using the standard protocol during bolus administration of intravenous contrast. Multiplanar CT image reconstructions and MIPs were obtained to evaluate the vascular anatomy. RADIATION DOSE REDUCTION: This exam was performed according to the departmental dose-optimization program which includes automated exposure control, adjustment of the mA and/or kV according to patient size and/or use of iterative reconstruction technique. CONTRAST:  75mL OMNIPAQUE IOHEXOL 350 MG/ML SOLN COMPARISON:  11/26/2022 FINDINGS: Cardiovascular: Satisfactory opacification of the pulmonary arteries to the segmental level. No evidence of pulmonary embolism. Normal heart size. No pericardial effusion. Mediastinum/Nodes: Thyroid gland, trachea and  esophagus are unremarkable. Prominent subcarinal node measures 1.5 cm, image 113/5. Previously this measured 1.6 cm. Right hilar lymph node measures 1.5 cm, image 118/5. Unchanged from previous exam. No enlarged axillary lymph nodes. Lungs/Pleura: There is no pleural effusion identified. No airspace consolidation or pneumothorax. Stable solid nodule within the periphery of the left upper lobe measuring 4 mm. Mild patchy areas of ground-glass attenuation are noted within both lungs. Upper Abdomen: No acute abnormality. Again seen are geographic areas of fatty infiltration within the right hepatic lobe. Musculoskeletal: Multifocal lucent bone lesions are identified compatible with metastatic diseaseInterval cement augmentation of the T9 compression deformity. There is a new, mild anterior inferior endplate compression deformity involving the T10 vertebra with loss of approximately 10% of the vertebral body height, image 78/8. No retropulsion of fracture fragments identified. Review of the MIP images confirms the above findings. IMPRESSION: 1. No evidence for acute pulmonary embolus. 2. Mild patchy areas of ground-glass attenuation are noted within both lungs. This is a nonspecific finding and may be seen with small airways inflammation or infection. 3. Interval cement augmentation of the T9 compression deformity. There is a new, mild anterior inferior endplate compression deformity involving the T10 vertebra with loss of approximately 10% of the vertebral body  height. No retropulsion of fracture fragments identified. 4. Multifocal lucent bone lesions compatible with metastatic disease. Electronically Signed   By: Signa Kell M.D.   On: 01/11/2023 11:13        Scheduled Meds:  famotidine  10 mg Oral BID   feeding supplement  237 mL Oral BID BM   gabapentin  300 mg Oral QHS   irbesartan  37.5 mg Oral Daily   venlafaxine XR  37.5 mg Oral Daily   Continuous Infusions:  azithromycin 500 mg (01/12/23 0839)    cefTRIAXone (ROCEPHIN)  IV 1 g (01/12/23 0958)     LOS: 0 days    Time spent: 35 minutes    Dorcas Carrow, MD [facility] Pager [phone]

## 2023-01-12 NOTE — Evaluation (Signed)
Physical Therapy Evaluation Patient Details Name: NYRIE LAUE MRN: [id number] DOB: [birth year] Today's Date: 01/12/2023  History of Present Illness  67 yo female admitted to hospital on 01/11/2023 due to SOB, persistent cough and worsening back pain. Pt was found to be hypoxic secondary to atypical PNA and is now requiring supplemental O2 as well as worsening LFTs. Imaging revealed T9 and T10 compression fx. Pt fitted for TLSO. Pt has significant PMH for metastatic breast ca to bone and liver, s/p chemotherapy and radiation, HTN and vertigo.  Clinical Impression    Pt admitted with above diagnosis.  Pt currently with functional limitations due to the deficits listed below (see PT Problem List). Pt in bed when PT arrived. Friends present. Pt drinking contrast for upcoming CT. Pt on 2 L/min at rest and 92% and no reports of SOB or dizziness. Pt indicated 3/10 back pain attributed to compression fx. Pt required min A for supine to sit, PT provided pt, friend and nursing staff ed on donning/doffing TLSO. TLSO for comfort. Pt required min guard for STS from EOB. Min guard for gait tasks in hallway with IV pole for 80 feet and pt indicating SOB with mobility, once return to room O2 72% on 2 L/min PT guided pt though pursed lip breathing and minimal recovery at 45s and increased supplemental O2 to 3 L/min with pt requriing a total of 3:52 to recover to 90% on 3 L/min, once recovered PT decreased to 2 L/min and ensured pt remained >/=90% and made nurse aware. Pt left in bed all needs in place, TLSO doffed and friends present.  Pt will benefit from acute skilled PT to increase their independence and safety with mobility to allow discharge.        Recommendations for follow up therapy are one component of a multi-disciplinary discharge planning process, led by the attending physician.  Recommendations may be updated based on patient status, additional functional criteria and insurance authorization.  Follow Up  Recommendations       Assistance Recommended at Discharge Intermittent Supervision/Assistance  Patient can return home with the following  A little help with walking and/or transfers;A little help with bathing/dressing/bathroom;Assistance with cooking/housework;Assist for transportation;Help with stairs or ramp for entrance    Equipment Recommendations None recommended by PT (to be determined as pt progresses with therapy)  Recommendations for Other Services       Functional Status Assessment Patient has had a recent decline in their functional status and demonstrates the ability to make significant improvements in function in a reasonable and predictable amount of time.     Precautions / Restrictions Precautions Precautions: Fall (monitor O2, may require 3-4 L/min to maintain > 90% with exertion) Required Braces or Orthoses:  (TLSO for comfort) Restrictions Weight Bearing Restrictions: No      Mobility  Bed Mobility Overal bed mobility: Needs Assistance Bed Mobility: Supine to Sit, Sit to Supine     Supine to sit: Min assist Sit to supine: Min guard   General bed mobility comments: min cues and HOB slightly elevated    Transfers Overall transfer level: Needs assistance Equipment used: None Transfers: Sit to/from Stand Sit to Stand: Min guard                Ambulation/Gait Ambulation/Gait assistance: Min guard Gait Distance (Feet): 80 Feet Assistive device: IV Pole Gait Pattern/deviations: Step-through pattern Gait velocity: decreased        Stairs  Wheelchair Mobility    Modified Rankin (Stroke Patients Only)       Balance Overall balance assessment: Needs assistance Sitting-balance support: Feet supported Sitting balance-Leahy Scale: Good     Standing balance support: Single extremity supported Standing balance-Leahy Scale: Fair                               Pertinent Vitals/Pain Pain Assessment Pain  Assessment: 0-10 Pain Score: 3  Pain Location: back Pain Descriptors / Indicators: Aching, Constant, Sore Pain Intervention(s): Limited activity within patient's tolerance, Monitored during session, Repositioned    Home Living Family/patient expects to be discharged to:: Private residence Living Arrangements: Spouse/significant other Available Help at Discharge: Family;Friend(s) Type of Home: House Home Access: Stairs to enter Entrance Stairs-Rails: None Entrance Stairs-Number of Steps: 1   Home Layout: One level Home Equipment: None      Prior Function Prior Level of Function : Independent/Modified Independent;Driving             Mobility Comments: IND with all ADLs, self care tasks, IADLs, no AD ADLs Comments: Husband currently works in [place] and pt is home alone frequently     Hand Dominance        Extremity/Trunk Assessment        Lower Extremity Assessment Lower Extremity Assessment: Generalized weakness    Cervical / Trunk Assessment Cervical / Trunk Assessment: Normal  Communication   Communication: No difficulties  Cognition Arousal/Alertness: Awake/alert Behavior During Therapy: WFL for tasks assessed/performed Overall Cognitive Status: Within Functional Limits for tasks assessed                                 General Comments: friends present t/o eval        General Comments      Exercises General Exercises - Lower Extremity Ankle Circles/Pumps: AROM, Both, 20 reps (ed provided on reduced risk of DVT)   Assessment/Plan    PT Assessment Patient needs continued PT services  PT Problem List Decreased strength;Decreased activity tolerance;Decreased balance;Decreased mobility;Decreased coordination;Decreased cognition;Pain       PT Treatment Interventions DME instruction;Gait training;Stair training;Functional mobility training;Therapeutic activities;Therapeutic exercise;Balance training;Neuromuscular  re-education;Patient/family education;Modalities    PT Goals (Current goals can be found in the Care Plan section)  Acute Rehab PT Goals Patient Stated Goal: to go home PT Goal Formulation: With patient Time For Goal Achievement: 01/26/23 Potential to Achieve Goals: Good    Frequency Min 1X/week     Co-evaluation               AM-PAC PT "6 Clicks" Mobility  Outcome Measure Help needed turning from your back to your side while in a flat bed without using bedrails?: A Little Help needed moving from lying on your back to sitting on the side of a flat bed without using bedrails?: A Little Help needed moving to and from a bed to a chair (including a wheelchair)?: A Little Help needed standing up from a chair using your arms (e.g., wheelchair or bedside chair)?: A Little Help needed to walk in hospital room?: A Little Help needed climbing 3-5 steps with a railing? : A Lot 6 Click Score: 17    End of Session Equipment Utilized During Treatment: Back brace Activity Tolerance: No increased pain;Patient limited by fatigue Patient left: in bed;with call bell/phone within reach;with family/visitor present Nurse Communication: Mobility status;Other (comment) (desaturation  with gait tasks) PT Visit Diagnosis: Unsteadiness on feet (R26.81);Other abnormalities of gait and mobility (R26.89);Muscle weakness (generalized) (M62.81);Pain    Time: 4782-9562 PT Time Calculation (min) (ACUTE ONLY): 31 min   Charges:   PT Evaluation $PT Eval Moderate Complexity: 1 Mod PT Treatments $Gait Training: 8-22 mins         Johnny Bridge, PT Acute Rehab   Jacqualyn Posey 01/12/2023, 12:12 PM

## 2023-01-13 ENCOUNTER — Other Ambulatory Visit: Payer: Self-pay

## 2023-01-13 ENCOUNTER — Encounter: Payer: Self-pay | Admitting: Hematology and Oncology

## 2023-01-13 ENCOUNTER — Ambulatory Visit
Admission: RE | Admit: 2023-01-13 | Discharge: 2023-01-13 | Disposition: A | Discharge status: Home / Self Care | Payer: Medicare HMO | Source: Ambulatory Visit | Attending: Radiation Oncology | Admitting: Radiation Oncology

## 2023-01-13 ENCOUNTER — Other Ambulatory Visit: Payer: Self-pay | Admitting: Hematology and Oncology

## 2023-01-13 DIAGNOSIS — C7951 Secondary malignant neoplasm of bone: Secondary | ICD-10-CM

## 2023-01-13 DIAGNOSIS — Z170 Estrogen receptor positive status [ER+]: Secondary | ICD-10-CM

## 2023-01-13 DIAGNOSIS — J9601 Acute respiratory failure with hypoxia: Secondary | ICD-10-CM | POA: Diagnosis not present

## 2023-01-13 LAB — COMPREHENSIVE METABOLIC PANEL
ALT: 51 U/L — ABNORMAL HIGH (ref 0–44)
AST: 197 U/L — ABNORMAL HIGH (ref 15–41)
Albumin: 2.6 g/dL — ABNORMAL LOW (ref 3.5–5.0)
Alkaline Phosphatase: 264 U/L — ABNORMAL HIGH (ref 38–126)
Anion gap: 8 (ref 5–15)
BUN: 11 mg/dL (ref 8–23)
CO2: 21 mmol/L — ABNORMAL LOW (ref 22–32)
Calcium: 7.9 mg/dL — ABNORMAL LOW (ref 8.9–10.3)
Chloride: 104 mmol/L (ref 98–111)
Creatinine, Ser: 0.91 mg/dL (ref 0.44–1.00)
GFR, Estimated: >60 mL/min (ref >60)
Glucose, Bld: 113 mg/dL — ABNORMAL HIGH (ref 70–99)
Potassium: 4 mmol/L (ref 3.5–5.1)
Sodium: 133 mmol/L — ABNORMAL LOW (ref 135–145)
Total Bilirubin: 2.1 mg/dL — ABNORMAL HIGH (ref 0.3–1.2)
Total Protein: 5.5 g/dL — ABNORMAL LOW (ref 6.5–8.1)

## 2023-01-13 LAB — URINALYSIS, COMPLETE (UACMP) WITH MICROSCOPIC
Bacteria, UA: NONE SEEN
Bilirubin Urine: NEGATIVE
Glucose, UA: NEGATIVE mg/dL
Hgb urine dipstick: NEGATIVE
Ketones, ur: NEGATIVE mg/dL
Nitrite: NEGATIVE
Protein, ur: 30 mg/dL — AB
Specific Gravity, Urine: 1.026 (ref 1.005–1.030)
pH: 5 (ref 5.0–8.0)

## 2023-01-13 LAB — RAD ONC ARIA SESSION SUMMARY
Course Elapsed Days: 7
Plan Fractions Treated to Date: 5
Plan Prescribed Dose Per Fraction: 3 Gy
Plan Total Fractions Prescribed: 10
Plan Total Prescribed Dose: 30 Gy
Reference Point Dosage Given to Date: 15 Gy
Reference Point Session Dosage Given: 3 Gy
Session Number: 5

## 2023-01-13 LAB — PREPARE RBC (CROSSMATCH)

## 2023-01-13 LAB — CBC
HCT: 25.5 % — ABNORMAL LOW (ref 36.0–46.0)
Hemoglobin: 8.2 g/dL — ABNORMAL LOW (ref 12.0–15.0)
MCH: 33.3 pg (ref 26.0–34.0)
MCHC: 32.2 g/dL (ref 30.0–36.0)
MCV: 103.7 fL — ABNORMAL HIGH (ref 80.0–100.0)
Platelets: 29 10*3/uL — CL (ref 150–400)
RBC: 2.46 MIL/uL — ABNORMAL LOW (ref 3.87–5.11)
RDW: 21.1 % — ABNORMAL HIGH (ref 11.5–15.5)
WBC: 4.8 10*3/uL (ref 4.0–10.5)
nRBC: 12.1 % — ABNORMAL HIGH (ref 0.0–0.2)

## 2023-01-13 LAB — PREPARE PLATELET PHERESIS
Unit division: 0
Unit division: 0

## 2023-01-13 LAB — VITAMIN B12: Vitamin B-12: 3836 pg/mL — ABNORMAL HIGH (ref 180–914)

## 2023-01-13 LAB — STREP PNEUMONIAE URINARY ANTIGEN: Strep Pneumo Urinary Antigen: NEGATIVE

## 2023-01-13 LAB — BPAM PLATELET PHERESIS: ISSUE DATE / TIME: 202405151628

## 2023-01-13 LAB — LACTIC ACID, PLASMA: Lactic Acid, Venous: 1.9 mmol/L (ref 0.5–1.9)

## 2023-01-13 LAB — TYPE AND SCREEN

## 2023-01-13 LAB — FOLATE: Folate: 15.1 ng/mL (ref >5.9)

## 2023-01-13 MED ORDER — SALINE SPRAY 0.65 % NA SOLN
1.0000 | NASAL | Status: DC | PRN
  Filled 2023-01-13: qty 44

## 2023-01-13 MED ORDER — ONDANSETRON HCL 8 MG PO TABS
8.0000 mg | ORAL_TABLET | Freq: Three times a day (TID) | ORAL | 1 refills | Status: DC | PRN
Start: 2023-01-13 — End: 2023-01-21

## 2023-01-13 MED ORDER — SODIUM CHLORIDE 0.9% IV SOLUTION: Freq: Once | INTRAVENOUS | Status: AC

## 2023-01-13 MED ORDER — ACETAMINOPHEN 500 MG PO TABS
1000.0000 mg | ORAL_TABLET | Freq: Once | ORAL | Status: AC
  Administered 2023-01-13: 1000 mg via ORAL

## 2023-01-13 MED ORDER — SODIUM CHLORIDE 0.9 % IV BOLUS
500.0000 mL | Freq: Once | INTRAVENOUS | Status: AC
  Administered 2023-01-13: 500 mL via INTRAVENOUS

## 2023-01-13 MED ORDER — DEXAMETHASONE 4 MG PO TABS
4.0000 mg | ORAL_TABLET | Freq: Every day | ORAL | 0 refills | Status: DC
Start: 2023-01-13 — End: 2023-09-02

## 2023-01-13 MED ORDER — DIPHENHYDRAMINE HCL 50 MG/ML IJ SOLN
12.5000 mg | Freq: Once | INTRAMUSCULAR | Status: AC
  Administered 2023-01-13: 12.5 mg via INTRAVENOUS
  Filled 2023-01-13: qty 1

## 2023-01-13 MED ORDER — PROCHLORPERAZINE MALEATE 10 MG PO TABS
10.0000 mg | ORAL_TABLET | Freq: Four times a day (QID) | ORAL | 1 refills | Status: DC | PRN
Start: 2023-01-13 — End: 2023-02-19

## 2023-01-13 NOTE — TOC Progression Note (Signed)
Transition of Care [facility]) - Progression Note    Patient Details  Name: Theresa [name] MRN: [id number] Date of Birth: 07-21-1956  Transition of [facility]) CM/SW Contact  Beckie Busing, RN Phone Number:[phone]  01/13/2023, 3:50 PM  Clinical Narrative:     Transition of Care [facility]) Screening Note   Patient Details  Name: Theresa [name] Date of Birth: 12/11/1955   Transition of Care [facility]) CM/SW Contact:    Beckie Busing, RN Phone Number: 01/13/2023, 3:51 PM    Transition of Care Department [facility]) has reviewed patient and no TOC needs have been identified at this time. We will continue to monitor patient advancement through interdisciplinary progression rounds. If new patient transition needs arise, please place a TOC consult.          Expected Discharge Plan and Services                                               Social Determinants of Health (SDOH) Interventions SDOH Screenings   Food Insecurity: No Food Insecurity (01/11/2023)  Housing: Low Risk  (01/11/2023)  Transportation Needs: No Transportation Needs (01/11/2023)  Utilities: Not At Risk (01/11/2023)  Depression (PHQ2-9): Low Risk  (12/18/2022)  Tobacco Use: Medium Risk (01/12/2023)    Readmission Risk Interventions     No data to display

## 2023-01-13 NOTE — Progress Notes (Signed)
Oncology  Reason for hospitalization: Shortness of breath, pancytopenia Oncology History  Malignant neoplasm of lower-inner quadrant of right breast of female, estrogen receptor positive (HCC)  07/03/2019 Initial Diagnosis   right lower inner quadrant biopsy, for a clinicaly multiple T1c N0, stage IA invasive ductal carcinoma, grade 2, E-cadherin positive, strongly estrogen receptor positive but progesterone receptor negative and HER-2 not amplified, with an MIB-1 of 5%             (a) breast MRI 08/07/2019 showed 2 additional suspicious areas in the right breast and 1 in the left breast              (b) biopsy of all 3 areas 08/21/2019 showed no malignancy   07/26/2019 -  Anti-estrogen oral therapy   tamoxifen started neoadjuvantly 07/26/2019; to be continued for 10 years.  S/p TAH/BSO on 01/16/2020 with benign pathology   08/04/2019 Genetic Testing   Negative genetic testing:  No pathogenic variants detected on the [facility] Breast Cancer STAT panel or the Common Hereditary Cancers panel. The report date is 08/04/2019.  The STAT Breast cancer panel offered by [facility] includes sequencing and rearrangement analysis for the following 9 genes:  ATM, BRCA1, BRCA2, CDH1, CHEK2, PALB2, PTEN, STK11 and TP53.  The Common Hereditary Cancers Panel offered by [facility] includes sequencing and/or deletion duplication testing of the following 48 genes: APC, ATM, AXIN2, BARD1, BMPR1A, BRCA1, BRCA2, BRIP1, CDH1, CDK4, CDKN2A (p14ARF), CDKN2A (p16INK4a), CHEK2, CTNNA1, DICER1, EPCAM (Deletion/duplication testing only), GREM1 (promoter region deletion/duplication testing only), KIT, MEN1, MLH1, MSH2, MSH3, MSH6, MUTYH, NBN, NF1, NHTL1, PALB2, PDGFRA, PMS2, POLD1, POLE, PTEN, RAD50, RAD51C, RAD51D, RNF43, SDHB, SDHC, SDHD, SMAD4, SMARCA4. STK11, TP53, TSC1, TSC2, and VHL.  The following genes were evaluated for sequence changes only: SDHA and HOXB13 c.251G>A variant only.    10/04/2019 Cancer Staging   Staging form: Breast,  AJCC 8th Edition - Pathologic stage from 10/04/2019: Stage IIB (pT2, pN1a(sn), cM0, G2, ER+, PR-, HER2-) - Signed by Loa Socks, NP on 10/18/2019   10/04/2019 Surgery    right lumpectomy and sentinel lymph node sampling 10/04/2019 showed a pT2 pN1, stage IIB invasive ductal carcinoma, grade 2, with positive lymphovascular invasion and a positive superior margin             (a) 3 sentinel lymph nodes removed, one with macro metastatic deposit, 1 with a micrometastatic deposit             (b) additional surgery for margin clearance   10/04/2019 Miscellaneous    MammaPrint on the 10/04/2019 sample shows a low risk luminal A tumor predicting a 5-year metastasis free survival of 96% without chemotherapy, and a chemotherapy benefit of less than 1.5%    11/27/2019 - 01/05/2020 Radiation Therapy    Site Technique Total Dose (Gy) Dose per Fx (Gy) Completed Fx Beam Energies  Breast, Right: Breast_Rt 3D 50/50 2 25/25 6X, 10X  Breast, Right: Breast_Rt_SCV_PAB 3D 50/50 2 25/25 6X, 10X  Breast, Right: Breast_Rt_Bst 3D 10/10 2 5/5 6X, 10X    09/29/2022 Imaging   IMPRESSION: 1. Surgical changes involving the right medial breast. No obvious recurrent breast mass or chest wall mass. 2. Mediastinal and hilar lymphadenopathy suggesting metastatic disease. 3. Patchy tree-in-bud type nodularity in the lungs suggesting chronic inflammation or atypical infection such as MAC. No definite pulmonary metastatic nodules. 4. Large partially necrotic mass involving most of the right hepatic lobe and a 2.5 cm lesion in segment 3 of the liver consistent with metastatic disease.  5. 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   10/06/2022 Initial Biopsy   Liver biopsy: + for malignancy, metastatic poorly differentiated carcinoma, likely breast origin, biomarkers pending, insufficient tissue for molecular testing. (Resulted in EPIC on 10/13/2022)   10/07/2022 PET scan   PET scan  on 10/07/2022 that showed hypermetabolic metastatic breast cancer with hypermetabolic mediastinal and hilar adenopathy, hypermetabolic hepatic metastatic disease and bone disease.  There were no findings for pulmonary metastatic disease.   10/21/2022 -  Anti-estrogen oral therapy   Verzinio and Letrozole   01/18/2023 -  Chemotherapy   Patient is on Treatment Plan : BREAST METASTATIC Fam-Trastuzumab Deruxtecan-nxki (Enhertu) (5.4) q21d     Cancer, metastatic to bone (HCC)  12/21/2022 Initial Diagnosis   Cancer, metastatic to bone (HCC)   01/18/2023 -  Chemotherapy   Patient is on Treatment Plan : BREAST METASTATIC Fam-Trastuzumab Deruxtecan-nxki (Enhertu) (5.4) q21d         Latest Ref Rng & Units 01/13/2023    5:37 AM 01/12/2023    5:52 AM 01/11/2023   10:15 AM  CBC  WBC 4.0 - 10.5 K/uL 4.8  4.6    Hemoglobin 12.0 - 15.0 g/dL 8.2  8.8  16.1   Hematocrit 36.0 - 46.0 % 25.5  27.8  33.0   Platelets 150 - 400 K/uL 29  35        Latest Ref Rng & Units 01/13/2023    5:37 AM 01/12/2023    5:52 AM 01/11/2023   10:15 AM  CMP  Glucose 70 - 99 mg/dL 096  045    BUN 8 - 23 mg/dL 11  10    Creatinine 4.09 - 1.00 mg/dL 8.11  9.14    Sodium 782 - 145 mmol/L 133  134  135   Potassium 3.5 - 5.1 mmol/L 4.0  4.0  4.6   Chloride 98 - 111 mmol/L 104  106    CO2 22 - 32 mmol/L 21  20    Calcium 8.9 - 10.3 mg/dL 7.9  8.1    Total Protein 6.5 - 8.1 g/dL 5.5  6.1    Total Bilirubin 0.3 - 1.2 mg/dL 2.1  1.9    Alkaline Phos 38 - 126 U/L 264  277    AST 15 - 41 U/L 197  189    ALT 0 - 44 U/L 51  49     Treatment plan: Metastatic breast cancer with liver and bone metastasis.  Also bone marrow involvement pulmonary findings are concerning for lymphangitic mets.  This is possibly the cause of her difficulty with breathing.  She is currently being treated for an infection with antibiotics. I reviewed the CT scans with the patient had undergone in the hospital.  I suspect that the Verzinio is not being  effective.  (ER only 30%) based on 1+ positivity of the HER2, I recommend treatment with Enhertu. Will plan to start the treatment next week. We discussed goals of care and prognosis.  Patient fully understands that if she does not respond adequately to the upcoming treatment then she does not have good prognosis.  However she still wants to be able to get up and go to her barn and enjoy her horses.  I discussed with her that she could complete radiation tomorrow and Friday and get discharged after the radiation is complete.

## 2023-01-13 NOTE — Progress Notes (Signed)
START ON PATHWAY REGIMEN - Breast     A cycle is every 21 days:     Fam-trastuzumab deruxtecan-nxki   **Always confirm dose/schedule in your pharmacy ordering system**  Patient Characteristics: Distant Metastases or Locoregional Recurrent Disease - Unresected, M0 or Locally Advanced Unresectable Disease Progressing after Neoadjuvant and Local Therapies, M0, HER2 Low/Negative, ER Positive, Chemotherapy, HER2 Low, Second Line, gBRCA Wildtype or  Not a Candidate for Molecular Targeted Therapy Therapeutic Status: Distant Metastases HER2 Status: Low ER Status: Positive (+) PR Status: Positive (+) Therapy Approach Indicated: Standard Chemotherapy/Endocrine Therapy Line of Therapy: Second Line Intent of Therapy: Non-Curative / Palliative Intent, Discussed with Patient 

## 2023-01-13 NOTE — Progress Notes (Signed)
Physical Therapy Treatment Patient Details Name: Theresa [name] MRN: [id number] DOB: 02-04-1956 Today's Date: 01/13/2023   History of Present Illness 67 yo female admitted to hospital on 01/11/2023 due to SOB, persistent cough and worsening back pain. Pt was found to be hypoxic secondary to atypical PNA and is now requiring supplemental O2 as well as worsening LFTs. Imaging revealed T9 and T10 compression fx. Pt fitted for TLSO. Pt has significant PMH for metastatic breast ca to bone and liver, s/p chemotherapy and radiation, HTN and vertigo.    PT Comments    Pt received in bed, husband at bedside. Discuss role of PT and benefits of maintaining/increasing strength and mobility. Pt resting on 3L O2 with SpO2 at 94%, HR 115bpm. Supine to sit with vc's to avoid twisting at trunk since pt is declining TLSO. Supervision/CGA for bed mobility and transfers. Pt completed gait training in hall with light support of IV pole, vc's for pacing and PLB technique. Upon return, pt endorsed fatigue, no SOB, however SpO2 had decreased to 71% on 3L. Pt placed on 4L for one minute and was able to return to 94%. Overall good tolerance for activity. Will continue to progress as tolerated. Pt does not have any DME at home, lives on first floor with single low step to enter.    Recommendations for follow up therapy are one component of a multi-disciplinary discharge planning process, led by the attending physician.  Recommendations may be updated based on patient status, additional functional criteria and insurance authorization.  Follow Up Recommendations       Assistance Recommended at Discharge Intermittent Supervision/Assistance  Patient can return home with the following A little help with walking and/or transfers;A little help with bathing/dressing/bathroom;Assistance with cooking/housework;Assist for transportation;Help with stairs or ramp for entrance   Equipment Recommendations  None recommended by PT;Other  (comment) (TBD as pt progresses)    Recommendations for Other Services       Precautions / Restrictions Precautions Precautions: Fall Precaution Comments:  (Monitor O2, may need 4L for ambulation) Required Braces or Orthoses:  (Has TLSO in room, which she declines to donn) Restrictions Weight Bearing Restrictions: No     Mobility  Bed Mobility Overal bed mobility: Needs Assistance Bed Mobility: Supine to Sit     Supine to sit: Min guard     General bed mobility comments: min cues for LOG rolling and HOB slightly elevated    Transfers Overall transfer level: Needs assistance Equipment used: None Transfers: Sit to/from Stand Sit to Stand: Min guard           General transfer comment:  (Cues to move slowly in case of dizziness)    Ambulation/Gait Ambulation/Gait assistance: Supervision Gait Distance (Feet):  (300) Assistive device: IV Pole Gait Pattern/deviations: Step-through pattern Gait velocity: decreased     General Gait Details:  (Steady with ambulation in room unsupported while negotiating around objects and changes in direction)   Stairs             Wheelchair Mobility    Modified Rankin (Stroke Patients Only)       Balance Overall balance assessment: Needs assistance Sitting-balance support: Feet supported Sitting balance-Leahy Scale: Normal     Standing balance support: During functional activity, Single extremity supported Standing balance-Leahy Scale: Fair                              Cognition Arousal/Alertness: Awake/alert Behavior During Therapy: WFL for  tasks assessed/performed Overall Cognitive Status: Within Functional Limits for tasks assessed                                 General Comments:  (Supportive husband at bedside, pt remains very motivated despite possible prognosis)        Exercises Other Exercises Other Exercises: Educated pt and husband on benefits of continueing LE  strengthening exercises and benefits of maintaining mobility    General Comments General comments (skin integrity, edema, etc.): SpO2 94% on 3L at rest, HR 113bpm, After ambulation, SpO2 at 71%, HR 135bpm, no dizziness or SOB. O2 increased to 4L vc's for PLB technique, SpO2 returned to 94% after 1 minute. Pt placed back on 3L O2, nursing notified      Pertinent Vitals/Pain Pain Assessment Pain Assessment: No/denies pain    Home Living                          Prior Function            PT Goals (current goals can now be found in the care plan section) Acute Rehab PT Goals Patient Stated Goal: to go home    Frequency    Min 1X/week      PT Plan      Co-evaluation              AM-PAC PT "6 Clicks" Mobility   Outcome Measure  Help needed turning from your back to your side while in a flat bed without using bedrails?: A Little Help needed moving from lying on your back to sitting on the side of a flat bed without using bedrails?: A Little Help needed moving to and from a bed to a chair (including a wheelchair)?: A Little Help needed standing up from a chair using your arms (e.g., wheelchair or bedside chair)?: A Little Help needed to walk in hospital room?: A Little Help needed climbing 3-5 steps with a railing? : A Lot 6 Click Score: 17    End of Session Equipment Utilized During Treatment: Gait belt Activity Tolerance: No increased pain;Patient limited by fatigue Patient left: in chair;with call bell/phone within reach;with family/visitor present Nurse Communication: Mobility status;Other (comment) (Desaturation during ambulation) PT Visit Diagnosis: Unsteadiness on feet (R26.81);Other abnormalities of gait and mobility (R26.89);Muscle weakness (generalized) (M62.81);Pain     Time: 1610-9604 PT Time Calculation (min) (ACUTE ONLY): 49 min  Charges:  $Gait Training: 8-22 mins $Therapeutic Exercise: 8-22 mins $Therapeutic Activity: 8-22 mins            Zadie Cleverly, PTA  Jannet Askew 01/13/2023, 11:41 AM

## 2023-01-13 NOTE — Progress Notes (Signed)
    Patient Name: MARYCRUZ NADOLNY           DOB: 05-21-1956  MRN: [id number]      Admission Date: 01/11/2023  Attending Provider: Burnadette Pop, MD  Primary Diagnosis: Acute hypoxic respiratory failure [facility])   Level of care: Med-Surg    CROSS COVER NOTE   Date of Service   01/13/2023   IREM AUTERY, 67 y.o. female, was admitted on 01/11/2023 for Acute hypoxic respiratory failure (HCC).    HPI/Events of Note   Concerns for blood transfusion reaction raised by RN given rapid temperature increased.   Within first 30 minutes of starting pRBC, temp went from 98.9 F to 101.2 F.  All other vital signs stable. Blood transfusion has been stopped.  Current hemoglobin 8.2. No melena, hematochezia, or active bleeding.  RN denies any other acute changes or new symptoms. No distress, angioedema, or anaphylaxis. Patient denies chills, headache, chest pain, palpitations, lightheadedness, dizzy, abdominal discomfort, nausea, vomiting, back pain, hives.  Orders placed for Tylenol and IV Benadryl.  Blood bank has been contacted for blood transfusion protocol.  Interventions/ Plan   Tylenol IV Benadryl        Anthoney Harada, DNP, ACNPC- AG [facility] [place]

## 2023-01-13 NOTE — Progress Notes (Signed)
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   CT imaging was negative for PE but showed groundglass opacities.  Started on IV antibiotics.  Oncology also following.  Hospital course remarkable for worsening thrombocytopenia, persistent requirement of oxygen.  Assessment & Plan:  Principal Problem:   Acute hypoxic respiratory failure (HCC) Active Problems:   Cancer, metastatic to bone (HCC)   PNA (pneumonia)   Hypoxic respiratory failure (HCC)   LFT elevation   Hepatomegaly   Compression fracture of T9 vertebra (HCC)   Compression fracture of T10 vertebra (HCC)   Pneumonia   Acute hypoxic aspiratory failure: Presented with shortness of breath, weakness, cough.  Chest imagings showed groundglass opacities suspicious for multifocal pneumonia, no PE.  Started on azithromycin, ceftriaxone. She is not  on oxygen at home.  Currently on 2 L of oxygen per minute.  We will try to wean ,if not she will qualify for home oxygen. Continue to recommend incentive spirometer, mucolytic's, as needed bronchodilators. COVID/flu negative.   Metastatic right sided  breast cancer: S/P lumpectomy.Currently on letrozole, verzenio.  Currently being held.  On radiation treatments.  Follows with Dr. Pamelia Hoit. Imaging showed new, mild anterior inferior endplate compression deformity involving the T10 vertebra with loss of approximately 10% of the vertebral body height. No retropulsion of fracture fragments identified.  Continue pain medications, supportive care for back pain.  She is status post kyphoplasty for T9 compression fracture. Also found to have multifocal lucent bone lesions compatible with metastatic  disease. Patient has elevated liver enzymes likely secondary to metastatic breast cancer.  Severe thrombocytopenia: Platelets level going down.  This is most likely the side effect of chemotherapy,CT abd/pelvis did not show clear evidence of a hepatic metastatic disease progression  Macrocytic anemia: Hemoglobin has dropped from 11.2- to 8.2.  No report of hematochezia or melena.  Will check vitamin B12, folic acid level .will check FOBT.  Likely associated with malignancy  Debility/deconditioning: PT /OT recommending HH on discharge.Lives with hsuband  Goals of care: Patient has poor prognosis due to her metastatic breast cancer.  Overall declining.  Goals of care discussed extensively at bedside.  She was to be DNR.  Will also consider consulting  palliative care      DVT prophylaxis:SCDs Start: 01/11/23 1433     Code Status: Full Code  Family Communication: None at bedside  Patient status:Inpatient  Patient is from :Home  Anticipated discharge WJ:XBJY  Estimated DC date:2-3 days   Consultants: Oncology  Procedures:None  Antimicrobials:  Anti-infectives (From admission, onward)    Start     Dose/Rate Route Frequency Ordered Stop   01/12/23 1000  azithromycin (ZITHROMAX) 500 mg in sodium chloride 0.9 % 250 mL IVPB        500 mg 250 mL/hr over 60 Minutes Intravenous Every 24 hours 01/11/23 1430     01/12/23 1000  cefTRIAXone (ROCEPHIN) 1 g in sodium chloride 0.9 % 100 mL IVPB        1 g 200 mL/hr over 30 Minutes Intravenous Every 24 hours 01/11/23 1430     01/11/23 1130  cefTRIAXone (ROCEPHIN) 1 g in sodium chloride 0.9 % 100 mL IVPB        1 g 200 mL/hr over  30 Minutes Intravenous  Once 01/11/23 1122 01/11/23 1214   01/11/23 1130  azithromycin (ZITHROMAX) 500 mg in sodium chloride 0.9 % 250 mL IVPB        500 mg 250 mL/hr over 60 Minutes Intravenous  Once 01/11/23 1122 01/11/23 1335       Subjective: Patient seen and examined the bedside today.  He appears  weak, deconditioned.  Lying in bed.  Not in respite distress.  He speaks in full sentences.  Cough has been better.  She is  still on 3 days of oxygen which we will try to wean.  Denies abdomen, nausea or vomiting.  She has been having loose stools since last few days.  Denies any hematochezia or melena.  Objective: Vitals:   01/12/23 0535 01/12/23 0730 01/12/23 2029 01/13/23 0604  BP: 127/68  135/74 110/64  Pulse:   (!) 106 79  Resp: 16  16 16   Temp: (!) 97.5 F (36.4 C)  100 F (37.8 C) 97.7 F (36.5 C)  TempSrc: Oral  Oral Oral  SpO2: (!) 74% 92% 92% 94%  Weight:      Height:        Intake/Output Summary (Last 24 hours) at 01/13/2023 0842 Last data filed at 01/12/2023 2130 Gross per 24 hour  Intake 470 ml  Output 300 ml  Net 170 ml   Filed Weights   01/11/23 0951 01/11/23 1312  Weight: 63.5 kg 66.4 kg    Examination:  General exam: Overall comfortable, not in distress, weak, deconditioned HEENT: PERRL Respiratory system:  no wheezes or crackles  Cardiovascular system: S1 & S2 heard, RRR.  Gastrointestinal system: Abdomen is nondistended, soft and nontender. Central nervous system: Alert and oriented Extremities: No edema, no clubbing ,no cyanosis Skin: No rashes, no ulcers,no icterus     Data Reviewed: I have personally reviewed following labs and imaging studies  CBC: Recent Labs  Lab 01/11/23 1011 01/11/23 1015 01/12/23 0552 01/13/23 0537  WBC 4.9  --  4.6 4.8  NEUTROABS 2.7  --   --   --   HGB 10.1* 11.2* 8.8* 8.2*  HCT 30.8* 33.0* 27.8* 25.5*  MCV 101.3*  --  103.3* 103.7*  PLT 40*  --  35* 29*   Basic Metabolic Panel: Recent Labs  Lab 01/11/23 1011 01/11/23 1015 01/12/23 0552 01/13/23 0537  NA 136 135 134* 133*  K 4.6 4.6 4.0 4.0  CL 104  --  106 104  CO2 21*  --  20* 21*  GLUCOSE 104*  --  100* 113*  BUN 14  --  10 11  CREATININE 0.94  --  0.85 0.91  CALCIUM 9.2  --  8.1* 7.9*     Recent Results (from the past 240 hour(s))  SARS  Coronavirus 2 by RT PCR (hospital order, performed in [facility] hospital lab) *cepheid single result test* Anterior Nasal Swab     Status: None   Collection Time: 01/11/23 10:11 AM   Specimen: Anterior Nasal Swab  Result Value Ref Range Status   SARS Coronavirus 2 by RT PCR NEGATIVE NEGATIVE Final    Comment: (NOTE) SARS-CoV-2 target nucleic acids are NOT DETECTED.  The SARS-CoV-2 RNA is generally detectable in upper and lower respiratory specimens during the acute phase of infection. The lowest concentration of SARS-CoV-2 viral copies this assay can detect is 250 copies / mL. A negative result does not preclude SARS-CoV-2 infection and should not be used as the sole basis for treatment or other patient  management decisions.  A negative result may occur with improper specimen collection / handling, submission of specimen other than nasopharyngeal swab, presence of viral mutation(s) within the areas targeted by this assay, and inadequate number of viral copies (<250 copies / mL). A negative result must be combined with clinical observations, patient history, and epidemiological information.  Fact Sheet for Patients:   RoadLapTop.co.za  Fact Sheet for Healthcare Providers: http://kim-miller.com/  This test is not yet approved or  cleared by the [place] FDA and has been authorized for detection and/or diagnosis of SARS-CoV-2 by FDA under an Emergency Use Authorization (EUA).  This EUA will remain in effect (meaning this test can be used) for the duration of the COVID-19 declaration under Section 564(b)(1) of the Act, 21 U.S.C. section 360bbb-3(b)(1), unless the authorization is terminated or revoked sooner.  Performed at [facility], [address] [place], [place] [address]      Radiology Studies: CT ABDOMEN PELVIS W CONTRAST  Result Date: 01/12/2023 CLINICAL DATA:  Breast cancer with liver metastasis  and spine metastasis. New pathologic fracture T10. Back pain. Decreased appetite. Worsening LFTs. * Tracking Code: BO * EXAM: CT ABDOMEN AND PELVIS WITH CONTRAST TECHNIQUE: Multidetector CT imaging of the abdomen and pelvis was performed using the standard protocol following bolus administration of intravenous contrast. RADIATION DOSE REDUCTION: This exam was performed according to the departmental dose-optimization program which includes automated exposure control, adjustment of the mA and/or kV according to patient size and/or use of iterative reconstruction technique. CONTRAST:  OMNIPAQUE IOHEXOL 300 MG/ML  SOLN COMPARISON:  FDG PET scan 10/08/2022, CT 01/11/2023, CT 09/29/2022. FINDINGS: Lower chest: Lung bases are clear. Hepatobiliary: Large heterogeneously enhancing mass occupying a large portion of the RIGHT hepatic lobe is not significant changed from comparison PET scan. Smaller lesion inferior aspect of the lateral segment LEFT hepatic lobe again noted. Difficult to compare directly abut the LEFT hepatic lobe lesion appears enlarged from CT 09/29/2022. Postcholecystectomy. No biliary duct dilatation Pancreas: Pancreas is normal. No ductal dilatation. No pancreatic inflammation. Spleen: Normal spleen Adrenals/urinary tract: Adrenal glands and kidneys are normal. The ureters and bladder normal. Stomach/Bowel: Stomach, small bowel, appendix, and cecum are normal. The colon and rectosigmoid colon are normal. Vascular/Lymphatic: Abdominal aorta is normal caliber. No periportal or retroperitoneal adenopathy. No pelvic adenopathy. Reproductive: Post hysterectomy.  Adnexa unremarkable Other: No omental metastasis.  No peritoneal metastasis Musculoskeletal: Lucencies in increased trabeculation through the LEFT bony pelvis are unchanged. IMPRESSION: 1. No clear evidence of a hepatic metastatic disease progression. Potential enlargement of the lesion LEFT hepatic lobe compared to prior CT. 2. Mixed lytic and  sclerotic lesions in the pelvis similar prior. 3. No abdominopelvic lymphadenopathy. 4. No peritoneal metastasis. Electronically Signed   By: Genevive Bi M.D.   On: 01/12/2023 16:03   US ABDOMEN LIMITED RUQ (LIVER/GB)  Result Date: 01/11/2023 CLINICAL DATA:  Transaminitis.  History of breast cancer. EXAM: ULTRASOUND ABDOMEN LIMITED RIGHT UPPER QUADRANT COMPARISON:  CT abdomen pelvis 09/29/2022. PET-CT scan 10/08/2022 FINDINGS: Gallbladder: Previous cholecystectomy. Common bile duct: Diameter: 4 mm. Liver: Heterogeneous liver with multiple heterogeneous hypoechoic mass lesions. Example left hepatic lobe measures 3.4 x 2.1 x 3.6 cm. Right hepatic lobe 4.9 x 3.2 x 4.3 cm. There also some linear echogenic areas scattered in the liver. These of uncertain etiology but could represent some biliary air. Please correlate for any known history. Portal vein is patent on color Doppler imaging with normal direction of blood flow towards the liver. Other: Trace  ascites IMPRESSION: Previous cholecystectomy. No biliary ductal dilatation. Question some pneumobilia in the right hepatic lobe with some linear echogenic areas. However, on the CT scan of the chest from 01/11/2023 earlier than there was no air in the biliary tree. Known liver metastases, please correlate with prior workup. Overall with changes today a contrast CT of the abdomen and pelvis may be of some benefit when clinically appropriate. Electronically Signed   By: Karen Kays M.D.   On: 01/11/2023 12:13   DG Chest Port 1 View  Result Date: 01/11/2023 CLINICAL DATA:  Shortness of breath, undergoing treatment for breast cancer. EXAM: PORTABLE CHEST 1 VIEW COMPARISON:  11/26/2022 FINDINGS: Cardiac and mediastinal contours are within normal limits given AP technique. No focal pulmonary opacity. No pleural effusion or pneumothorax. No acute osseous abnormality. IMPRESSION: No acute cardiopulmonary process. Electronically Signed   By: Wiliam Ke M.D.   On:  01/11/2023 11:16   CT Angio Chest PE W and/or Wo Contrast  Result Date: 01/11/2023 CLINICAL DATA:  Rule out pulmonary embolism.  High probability. EXAM: CT ANGIOGRAPHY CHEST WITH CONTRAST TECHNIQUE: Multidetector CT imaging of the chest was performed using the standard protocol during bolus administration of intravenous contrast. Multiplanar CT image reconstructions and MIPs were obtained to evaluate the vascular anatomy. RADIATION DOSE REDUCTION: This exam was performed according to the departmental dose-optimization program which includes automated exposure control, adjustment of the mA and/or kV according to patient size and/or use of iterative reconstruction technique. CONTRAST:  75mL OMNIPAQUE IOHEXOL 350 MG/ML SOLN COMPARISON:  11/26/2022 FINDINGS: Cardiovascular: Satisfactory opacification of the pulmonary arteries to the segmental level. No evidence of pulmonary embolism. Normal heart size. No pericardial effusion. Mediastinum/Nodes: Thyroid gland, trachea and esophagus are unremarkable. Prominent subcarinal node measures 1.5 cm, image 113/5. Previously this measured 1.6 cm. Right hilar lymph node measures 1.5 cm, image 118/5. Unchanged from previous exam. No enlarged axillary lymph nodes. Lungs/Pleura: There is no pleural effusion identified. No airspace consolidation or pneumothorax. Stable solid nodule within the periphery of the left upper lobe measuring 4 mm. Mild patchy areas of ground-glass attenuation are noted within both lungs. Upper Abdomen: No acute abnormality. Again seen are geographic areas of fatty infiltration within the right hepatic lobe. Musculoskeletal: Multifocal lucent bone lesions are identified compatible with metastatic diseaseInterval cement augmentation of the T9 compression deformity. There is a new, mild anterior inferior endplate compression deformity involving the T10 vertebra with loss of approximately 10% of the vertebral body height, image 78/8. No retropulsion of  fracture fragments identified. Review of the MIP images confirms the above findings. IMPRESSION: 1. No evidence for acute pulmonary embolus. 2. Mild patchy areas of ground-glass attenuation are noted within both lungs. This is a nonspecific finding and may be seen with small airways inflammation or infection. 3. Interval cement augmentation of the T9 compression deformity. There is a new, mild anterior inferior endplate compression deformity involving the T10 vertebra with loss of approximately 10% of the vertebral body height. No retropulsion of fracture fragments identified. 4. Multifocal lucent bone lesions compatible with metastatic disease. Electronically Signed   By: Signa Kell M.D.   On: 01/11/2023 11:13    Scheduled Meds:  famotidine  10 mg Oral BID   feeding supplement  237 mL Oral BID BM   gabapentin  300 mg Oral QHS   irbesartan  37.5 mg Oral Daily   venlafaxine XR  37.5 mg Oral Daily   Continuous Infusions:  azithromycin 500 mg (01/12/23 0839)   cefTRIAXone (ROCEPHIN)  IV 1 g (01/12/23 0958)     LOS: 1 day   Burnadette Pop, MD [facility] P5/15/2024, 8:42 AM

## 2023-01-13 NOTE — Progress Notes (Signed)
SATURATION QUALIFICATIONS: (This note is used to comply with regulatory documentation for home oxygen)  Patient Saturations on Room Air at Rest = 87%  Patient Saturations on Room Air while Ambulating = 74%  Patient Saturations on 4 Liters of oxygen while Ambulating = 93%  Please briefly explain why patient needs home oxygen:

## 2023-01-14 ENCOUNTER — Other Ambulatory Visit: Payer: Self-pay

## 2023-01-14 ENCOUNTER — Telehealth: Payer: Self-pay | Admitting: Hematology and Oncology

## 2023-01-14 ENCOUNTER — Ambulatory Visit
Admission: RE | Admit: 2023-01-14 | Discharge: 2023-01-14 | Disposition: A | Discharge status: Home / Self Care | Payer: Medicare HMO | Source: Ambulatory Visit | Attending: Radiation Oncology | Admitting: Radiation Oncology

## 2023-01-14 DIAGNOSIS — J9601 Acute respiratory failure with hypoxia: Secondary | ICD-10-CM | POA: Diagnosis not present

## 2023-01-14 LAB — BPAM PLATELET PHERESIS
Blood Product Expiration Date: 202405162359
Blood Product Expiration Date: 202405182359
ISSUE DATE / TIME: 202405151412
Unit Type and Rh: 5100
Unit Type and Rh: 6200

## 2023-01-14 LAB — CBC
HCT: 24 % — ABNORMAL LOW (ref 36.0–46.0)
Hemoglobin: 7.8 g/dL — ABNORMAL LOW (ref 12.0–15.0)
MCH: 33.2 pg (ref 26.0–34.0)
MCHC: 32.5 g/dL (ref 30.0–36.0)
MCV: 102.1 fL — ABNORMAL HIGH (ref 80.0–100.0)
Platelets: 50 10*3/uL — ABNORMAL LOW (ref 150–400)
RBC: 2.35 MIL/uL — ABNORMAL LOW (ref 3.87–5.11)
RDW: 21.1 % — ABNORMAL HIGH (ref 11.5–15.5)
WBC: 4.9 10*3/uL (ref 4.0–10.5)
nRBC: 13.8 % — ABNORMAL HIGH (ref 0.0–0.2)

## 2023-01-14 LAB — RAD ONC ARIA SESSION SUMMARY
Course Elapsed Days: 8
Plan Fractions Treated to Date: 6
Plan Prescribed Dose Per Fraction: 3 Gy
Plan Total Fractions Prescribed: 10
Plan Total Prescribed Dose: 30 Gy
Reference Point Dosage Given to Date: 18 Gy
Reference Point Session Dosage Given: 3 Gy
Session Number: 6

## 2023-01-14 LAB — PREPARE PLATELET PHERESIS

## 2023-01-14 LAB — OCCULT BLOOD X 1 CARD TO LAB, STOOL: Fecal Occult Bld: NEGATIVE

## 2023-01-14 LAB — TYPE AND SCREEN

## 2023-01-14 LAB — COMPREHENSIVE METABOLIC PANEL
ALT: 49 U/L — ABNORMAL HIGH (ref 0–44)
AST: 199 U/L — ABNORMAL HIGH (ref 15–41)
Albumin: 2.6 g/dL — ABNORMAL LOW (ref 3.5–5.0)
Alkaline Phosphatase: 286 U/L — ABNORMAL HIGH (ref 38–126)
Anion gap: 7 (ref 5–15)
BUN: 13 mg/dL (ref 8–23)
CO2: 20 mmol/L — ABNORMAL LOW (ref 22–32)
Calcium: 7.9 mg/dL — ABNORMAL LOW (ref 8.9–10.3)
Chloride: 101 mmol/L (ref 98–111)
Creatinine, Ser: 0.7 mg/dL (ref 0.44–1.00)
GFR, Estimated: >60 mL/min (ref >60)
Glucose, Bld: 125 mg/dL — ABNORMAL HIGH (ref 70–99)
Potassium: 3.6 mmol/L (ref 3.5–5.1)
Sodium: 128 mmol/L — ABNORMAL LOW (ref 135–145)
Total Bilirubin: 2.4 mg/dL — ABNORMAL HIGH (ref 0.3–1.2)
Total Protein: 5.5 g/dL — ABNORMAL LOW (ref 6.5–8.1)

## 2023-01-14 LAB — BPAM RBC
Blood Product Expiration Date: 202406092359
ISSUE DATE / TIME: 202405151959

## 2023-01-14 LAB — TRANSFUSION REACTION

## 2023-01-14 MED ORDER — SIMETHICONE 80 MG PO CHEW
80.0000 mg | CHEWABLE_TABLET | Freq: Four times a day (QID) | ORAL | Status: DC | PRN
  Administered 2023-01-14: 80 mg via ORAL
  Filled 2023-01-14: qty 1

## 2023-01-14 MED ORDER — PROCHLORPERAZINE EDISYLATE 10 MG/2ML IJ SOLN
5.0000 mg | Freq: Four times a day (QID) | INTRAMUSCULAR | Status: DC | PRN
  Administered 2023-01-20: 5 mg via INTRAVENOUS
  Filled 2023-01-14: qty 2

## 2023-01-14 MED ORDER — SODIUM CHLORIDE 1 G PO TABS
1.0000 g | ORAL_TABLET | Freq: Three times a day (TID) | ORAL | Status: DC
  Administered 2023-01-14 (×2): 1 g via ORAL
  Filled 2023-01-14 (×2): qty 1

## 2023-01-14 NOTE — Progress Notes (Addendum)
PROGRESS NOTE  Theresa [name]  WUJ:[id number] DOB: Nov 25, 1955 DOA: 01/11/2023 PCP: Carylon Perches, MD   Brief Narrative: Patient is a 66 year old female with history of breast cancer with metastasis to liver and spine, on  chemotherapy/radiation therapy who presented to the emergency room with complaint of shortness of breath, fatigue, cough.   CT imaging was negative for PE but showed groundglass opacities.  Started on IV antibiotics.  Oncology also following.  Hospital course remarkable for worsening thrombocytopenia, persistent requirement of oxygen. Overall condition has improved.  Plan for discharge home tomorrow if remains stable  Assessment & Plan:  Principal Problem:   Acute hypoxic respiratory failure (HCC) Active Problems:   Cancer, metastatic to bone (HCC)   PNA (pneumonia)   Hypoxic respiratory failure (HCC)   LFT elevation   Hepatomegaly   Compression fracture of T9 vertebra (HCC)   Compression fracture of T10 vertebra (HCC)   Pneumonia   Acute hypoxic aspiratory failure: Presented with shortness of breath, weakness, cough.  Chest imagings showed groundglass opacities suspicious for multifocal pneumonia, no PE.  Started on azithromycin, ceftriaxone.  The imaging findings could also be from lymphangitic spread of the tumor She is not  on oxygen at home.  Currently on 2-3 L of oxygen per minute.  She qualified for home oxygen. Continue to recommend incentive spirometer, mucolytic's, as needed bronchodilators. COVID/flu negative.  Metastatic right sided  breast cancer: S/P lumpectomy.Currently on letrozole, verzenio.  Currently being held.  On radiation treatments.  Follows with Dr. Pamelia Hoit. Imaging showed new, mild anterior inferior endplate compression deformity involving the T10 vertebra with loss of approximately 10% of the vertebral body height. No retropulsion of fracture fragments identified.  Continue pain medications, supportive care for back pain.  She is status post  kyphoplasty for T9 compression fracture. Also found to have multifocal lucent bone lesions compatible with metastatic disease. Patient has elevated liver enzymes,bilirubin  likely secondary to metastatic breast cancer.  Poor prognosis. Oncology planning to start on different chemotherapy regimen as an outpatient  Severe thrombocytopenia: Platelets level went down to the range of 20,000..  This is most likely the side effect of chemotherapy,CT abd/pelvis did not show clear evidence of a hepatic metastatic disease progression. Transfused units of platelets on 5/15.  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 She is also on effexor. Started on salt tablets.Check bmp tomorrow  Macrocytic anemia: Hemoglobin has dropped from 11.2- to 8.2.  No report of hematochezia or melena.  Vitamin B12, folic acid level optimal.  Likely associated with malignancy.  Given a unit of blood transfusion on 5/15, had some transfusion reaction with fever.  Afebrile this morning  Debility/deconditioning: PT /OT recommending HH on discharge.Lives with husband  Goals of care: Patient has poor prognosis due to her metastatic breast cancer.  Overall declining.  Goals of care discussed extensively at bedside.  Remains full code.     DVT prophylaxis:SCDs Start: 01/11/23 1433     Code Status: Full Code  Family Communication: None at bedside.  Patient says no need to call her husband  Patient status:Inpatient  Patient is from :Home  Anticipated discharge NF:AOZH  Estimated DC date: Tomorrow   Consultants: Oncology  Procedures:None  Antimicrobials:  Anti-infectives (From admission, onward)    Start     Dose/Rate Route Frequency Ordered Stop   01/12/23 1000  azithromycin (ZITHROMAX) 500 mg in sodium chloride 0.9 % 250 mL IVPB  500 mg 250 mL/hr over 60 Minutes Intravenous Every 24 hours 01/11/23 1430     01/12/23 1000  cefTRIAXone  (ROCEPHIN) 1 g in sodium chloride 0.9 % 100 mL IVPB        1 g 200 mL/hr over 30 Minutes Intravenous Every 24 hours 01/11/23 1430     01/11/23 1130  cefTRIAXone (ROCEPHIN) 1 g in sodium chloride 0.9 % 100 mL IVPB        1 g 200 mL/hr over 30 Minutes Intravenous  Once 01/11/23 1122 01/11/23 1214   01/11/23 1130  azithromycin (ZITHROMAX) 500 mg in sodium chloride 0.9 % 250 mL IVPB        500 mg 250 mL/hr over 60 Minutes Intravenous  Once 01/11/23 1122 01/11/23 1335       Subjective: Patient seen and examined at bedside today sitting in the chair and trying to eat her breakfast.  Looks overall comfortable but remains weak.  No nausea, vomiting.  Speaking in full sentences.  Denies any worsening cough or shortness of breath.  Objective: Vitals:   01/13/23 2230 01/14/23 0115 01/14/23 0304 01/14/23 0544  BP:  (!) 100/59 114/61 113/66  Pulse:  (!) 106 (!) 102 93  Resp:  14 18 14   Temp: 98.8 F (37.1 C) 98 F (36.7 C) 98.8 F (37.1 C) 98 F (36.7 C)  TempSrc: Oral  Oral Axillary  SpO2:  92% 93%   Weight:      Height:        Intake/Output Summary (Last 24 hours) at 01/14/2023 1017 Last data filed at 01/14/2023 0940 Gross per 24 hour  Intake 1815.41 ml  Output 400 ml  Net 1415.41 ml   Filed Weights   01/11/23 0951 01/11/23 1312  Weight: 63.5 kg 66.4 kg    Examination:  General exam: Overall comfortable, not in distress,weak and deconditioned HEENT: PERRL Respiratory system: Mild crackles bilaterally on bases Cardiovascular system: S1 & S2 heard, RRR.  Gastrointestinal system: Abdomen is nondistended, soft and nontender. Central nervous system: Alert and oriented Extremities: No edema, no clubbing ,no cyanosis Skin: No rashes, no ulcers,no icterus      Data Reviewed: I have personally reviewed following labs and imaging studies  CBC: Recent Labs  Lab 01/11/23 1011 01/11/23 1015 01/12/23 0552 01/13/23 0537 01/14/23 0102  WBC 4.9  --  4.6 4.8 4.9  NEUTROABS 2.7   --   --   --   --   HGB 10.1* 11.2* 8.8* 8.2* 7.8*  HCT 30.8* 33.0* 27.8* 25.5* 24.0*  MCV 101.3*  --  103.3* 103.7* 102.1*  PLT 40*  --  35* 29* 50*   Basic Metabolic Panel: Recent Labs  Lab 01/11/23 1011 01/11/23 1015 01/12/23 0552 01/13/23 0537 01/14/23 0102  NA 136 135 134* 133* 128*  K 4.6 4.6 4.0 4.0 3.6  CL 104  --  106 104 101  CO2 21*  --  20* 21* 20*  GLUCOSE 104*  --  100* 113* 125*  BUN 14  --  10 11 13   CREATININE 0.94  --  0.85 0.91 0.70  CALCIUM 9.2  --  8.1* 7.9* 7.9*     Recent Results (from the past 240 hour(s))  SARS Coronavirus 2 by RT PCR (hospital order, performed in [facility] hospital lab) *cepheid single result test* Anterior Nasal Swab     Status: None   Collection Time: 01/11/23 10:11 AM   Specimen: Anterior Nasal Swab  Result Value Ref Range Status   SARS  Coronavirus 2 by RT PCR NEGATIVE NEGATIVE Final    Comment: (NOTE) SARS-CoV-2 target nucleic acids are NOT DETECTED.  The SARS-CoV-2 RNA is generally detectable in upper and lower respiratory specimens during the acute phase of infection. The lowest concentration of SARS-CoV-2 viral copies this assay can detect is 250 copies / mL. A negative result does not preclude SARS-CoV-2 infection and should not be used as the sole basis for treatment or other patient management decisions.  A negative result may occur with improper specimen collection / handling, submission of specimen other than nasopharyngeal swab, presence of viral mutation(s) within the areas targeted by this assay, and inadequate number of viral copies (<250 copies / mL). A negative result must be combined with clinical observations, patient history, and epidemiological information.  Fact Sheet for Patients:   RoadLapTop.co.za  Fact Sheet for Healthcare Providers: http://kim-miller.com/  This test is not yet approved or  cleared by the [place] FDA and has been authorized for  detection and/or diagnosis of SARS-CoV-2 by FDA under an Emergency Use Authorization (EUA).  This EUA will remain in effect (meaning this test can be used) for the duration of the COVID-19 declaration under Section 564(b)(1) of the Act, 21 U.S.C. section 360bbb-3(b)(1), unless the authorization is terminated or revoked sooner.  Performed at [facility], [address] [place], [place] [address]      Radiology Studies: CT ABDOMEN PELVIS W CONTRAST  Result Date: 01/12/2023 CLINICAL DATA:  Breast cancer with liver metastasis and spine metastasis. New pathologic fracture T10. Back pain. Decreased appetite. Worsening LFTs. * Tracking Code: BO * EXAM: CT ABDOMEN AND PELVIS WITH CONTRAST TECHNIQUE: Multidetector CT imaging of the abdomen and pelvis was performed using the standard protocol following bolus administration of intravenous contrast. RADIATION DOSE REDUCTION: This exam was performed according to the departmental dose-optimization program which includes automated exposure control, adjustment of the mA and/or kV according to patient size and/or use of iterative reconstruction technique. CONTRAST:  OMNIPAQUE IOHEXOL 300 MG/ML  SOLN COMPARISON:  FDG PET scan 10/08/2022, CT 01/11/2023, CT 09/29/2022. FINDINGS: Lower chest: Lung bases are clear. Hepatobiliary: Large heterogeneously enhancing mass occupying a large portion of the RIGHT hepatic lobe is not significant changed from comparison PET scan. Smaller lesion inferior aspect of the lateral segment LEFT hepatic lobe again noted. Difficult to compare directly abut the LEFT hepatic lobe lesion appears enlarged from CT 09/29/2022. Postcholecystectomy. No biliary duct dilatation Pancreas: Pancreas is normal. No ductal dilatation. No pancreatic inflammation. Spleen: Normal spleen Adrenals/urinary tract: Adrenal glands and kidneys are normal. The ureters and bladder normal. Stomach/Bowel: Stomach, small bowel, appendix, and  cecum are normal. The colon and rectosigmoid colon are normal. Vascular/Lymphatic: Abdominal aorta is normal caliber. No periportal or retroperitoneal adenopathy. No pelvic adenopathy. Reproductive: Post hysterectomy.  Adnexa unremarkable Other: No omental metastasis.  No peritoneal metastasis Musculoskeletal: Lucencies in increased trabeculation through the LEFT bony pelvis are unchanged. IMPRESSION: 1. No clear evidence of a hepatic metastatic disease progression. Potential enlargement of the lesion LEFT hepatic lobe compared to prior CT. 2. Mixed lytic and sclerotic lesions in the pelvis similar prior. 3. No abdominopelvic lymphadenopathy. 4. No peritoneal metastasis. Electronically Signed   By: Genevive Bi M.D.   On: 01/12/2023 16:03    Scheduled Meds:  famotidine  10 mg Oral BID   feeding supplement  237 mL Oral BID BM   gabapentin  300 mg Oral QHS   irbesartan  37.5 mg Oral Daily   sodium chloride  1 g Oral TID WC   venlafaxine XR  37.5 mg Oral Daily   Continuous Infusions:  azithromycin Stopped (01/13/23 1135)   cefTRIAXone (ROCEPHIN)  IV Stopped (01/13/23 1022)     LOS: 2 days   Burnadette Pop, MD [facility] P5/16/2024, 10:17 AM

## 2023-01-14 NOTE — TOC Progression Note (Addendum)
Transition of Care [facility]) - Progression Note    Patient Details  Name: GENIVIEVE CALLIER MRN: [id number] Date of Birth: 1956-04-17  Transition of Care [facility] CM/SW Contact  Beckie Busing, RN Phone [phone]  01/14/2023, 3:27 PM  Clinical Narrative:  [facility] acknowledges consult for patient with Home health/ DME needs. Cm at bedside introduces self and made patient aware of recommendations and to offer choice for home health services. Patient and family agreeable to home health as long as insurance will cover and agency can service her county. Home Health referral has been accepted by [facility]. Home O2 referral has been submitted to [facility] with [facility] and will be delivered to bedside.  1542 MD will need to enter Home health orders        Expected Discharge Plan and Services                                               Social Determinants of Health (SDOH) Interventions SDOH Screenings   Food Insecurity: No Food Insecurity (01/11/2023)  Housing: Low Risk  (01/11/2023)  Transportation Needs: No Transportation Needs (01/11/2023)  Utilities: Not At Risk (01/11/2023)  Depression (PHQ2-9): Low Risk  (12/18/2022)  Tobacco Use: Medium Risk (01/12/2023)    Readmission Risk Interventions     No data to display

## 2023-01-14 NOTE — Progress Notes (Signed)
Mobility Specialist - Progress Note   01/14/23 1606  Oxygen Therapy  SpO2 (!) 73 %  O2 Device Nasal Cannula  O2 Flow Rate (L/min) 3 L/min  Patient Activity (if Appropriate) Ambulating  Mobility  Activity Ambulated independently in hallway  Level of Assistance Independent  Assistive Device None  Distance Ambulated (ft) 160 ft  Activity Response Tolerated well  Mobility Referral Yes  $Mobility charge 1 Mobility  Mobility Specialist Start Time (ACUTE ONLY) [id number]  Mobility Specialist Stop Time (ACUTE ONLY) 0401  Mobility Specialist Time Calculation (min) (ACUTE ONLY) 23 min   Pt received in bed and agreeable to mobility.  Prior to ambulating pt O2 was on 80%. Encouraged pursed lip breaths bringing O2 back up to 90%. Once in hallway pt became SOB, O2 checked. Pt desat to 73%. Encouraged a seated rest break (~38min) allowing O2 to come back up to 90% before heading back to room. No complaints during session. Pt to bed after session with all needs met. Nurse made aware of occurrences.   Pre-mobility: 107 HR, 80-90% SpO2 (3L [place]) During mobility: 125 73%-90% SpO2 (3L [place]) Post-mobility: 111 HR, 90% SPO2 (3L [place])  Chief Technology Officer

## 2023-01-14 NOTE — Telephone Encounter (Signed)
Contacted patient to scheduled appointments. Patient is aware of appointments that are scheduled.   

## 2023-01-15 ENCOUNTER — Other Ambulatory Visit: Payer: Self-pay | Admitting: Hematology and Oncology

## 2023-01-15 ENCOUNTER — Inpatient Hospital Stay (HOSPITAL_COMMUNITY): Payer: Medicare HMO

## 2023-01-15 ENCOUNTER — Other Ambulatory Visit (HOSPITAL_COMMUNITY): Payer: Medicare HMO

## 2023-01-15 ENCOUNTER — Ambulatory Visit
Admission: RE | Admit: 2023-01-15 | Discharge: 2023-01-15 | Disposition: A | Discharge status: Home / Self Care | Payer: Medicare HMO | Source: Ambulatory Visit | Attending: Radiation Oncology | Admitting: Radiation Oncology

## 2023-01-15 ENCOUNTER — Other Ambulatory Visit: Payer: Self-pay

## 2023-01-15 DIAGNOSIS — J9601 Acute respiratory failure with hypoxia: Secondary | ICD-10-CM | POA: Diagnosis not present

## 2023-01-15 LAB — COMPREHENSIVE METABOLIC PANEL
ALT: 56 U/L — ABNORMAL HIGH (ref 0–44)
AST: 252 U/L — ABNORMAL HIGH (ref 15–41)
Albumin: 2.7 g/dL — ABNORMAL LOW (ref 3.5–5.0)
Alkaline Phosphatase: 292 U/L — ABNORMAL HIGH (ref 38–126)
Anion gap: 9 (ref 5–15)
BUN: 14 mg/dL (ref 8–23)
CO2: 20 mmol/L — ABNORMAL LOW (ref 22–32)
Calcium: 8.2 mg/dL — ABNORMAL LOW (ref 8.9–10.3)
Chloride: 103 mmol/L (ref 98–111)
Creatinine, Ser: 0.69 mg/dL (ref 0.44–1.00)
GFR, Estimated: >60 mL/min (ref >60)
Glucose, Bld: 109 mg/dL — ABNORMAL HIGH (ref 70–99)
Potassium: 4.2 mmol/L (ref 3.5–5.1)
Sodium: 132 mmol/L — ABNORMAL LOW (ref 135–145)
Total Bilirubin: 5.6 mg/dL — ABNORMAL HIGH (ref 0.3–1.2)
Total Protein: 5.9 g/dL — ABNORMAL LOW (ref 6.5–8.1)

## 2023-01-15 LAB — RAD ONC ARIA SESSION SUMMARY
Course Elapsed Days: 9
Plan Fractions Treated to Date: 7
Plan Prescribed Dose Per Fraction: 3 Gy
Plan Total Fractions Prescribed: 10
Plan Total Prescribed Dose: 30 Gy
Reference Point Dosage Given to Date: 21 Gy
Reference Point Session Dosage Given: 3 Gy
Session Number: 7

## 2023-01-15 LAB — LEGIONELLA PNEUMOPHILA SEROGP 1 UR AG: L. pneumophila Serogp 1 Ur Ag: NEGATIVE

## 2023-01-15 LAB — C-REACTIVE PROTEIN: CRP: 7.7 mg/dL — ABNORMAL HIGH (ref <1.0)

## 2023-01-15 LAB — CBC
HCT: 25.6 % — ABNORMAL LOW (ref 36.0–46.0)
Hemoglobin: 8.4 g/dL — ABNORMAL LOW (ref 12.0–15.0)
MCH: 33.3 pg (ref 26.0–34.0)
MCHC: 32.8 g/dL (ref 30.0–36.0)
MCV: 101.6 fL — ABNORMAL HIGH (ref 80.0–100.0)
Platelets: 25 10*3/uL — CL (ref 150–400)
RBC: 2.52 MIL/uL — ABNORMAL LOW (ref 3.87–5.11)
RDW: 21.9 % — ABNORMAL HIGH (ref 11.5–15.5)
WBC: 7.3 10*3/uL (ref 4.0–10.5)
nRBC: 17.2 % — ABNORMAL HIGH (ref 0.0–0.2)

## 2023-01-15 LAB — MRSA NEXT GEN BY PCR, NASAL: MRSA by PCR Next Gen: NOT DETECTED

## 2023-01-15 LAB — BRAIN NATRIURETIC PEPTIDE: B Natriuretic Peptide: 446.5 pg/mL — ABNORMAL HIGH (ref 0.0–100.0)

## 2023-01-15 LAB — BILIRUBIN, DIRECT: Bilirubin, Direct: 2.8 mg/dL — ABNORMAL HIGH (ref 0.0–0.2)

## 2023-01-15 LAB — PROCALCITONIN: Procalcitonin: 0.63 ng/mL

## 2023-01-15 MED ORDER — SODIUM CHLORIDE 0.9 % IV SOLN
10.0000 mg | Freq: Once | INTRAVENOUS | Status: AC
  Administered 2023-01-15: 10 mg via INTRAVENOUS
  Filled 2023-01-15: qty 1

## 2023-01-15 MED ORDER — LEVALBUTEROL HCL 0.63 MG/3ML IN NEBU
0.6300 mg | INHALATION_SOLUTION | Freq: Two times a day (BID) | RESPIRATORY_TRACT | Status: DC
  Administered 2023-01-15 – 2023-01-16 (×2): 0.63 mg via RESPIRATORY_TRACT
  Filled 2023-01-15 (×2): qty 3

## 2023-01-15 MED ORDER — SODIUM CHLORIDE 0.9 % IV SOLN
2.0000 g | INTRAVENOUS | Status: DC
  Administered 2023-01-15 – 2023-01-17 (×3): 2 g via INTRAVENOUS
  Filled 2023-01-15 (×3): qty 20

## 2023-01-15 MED ORDER — IPRATROPIUM BROMIDE 0.02 % IN SOLN: 0.5000 mg | Freq: Two times a day (BID) | RESPIRATORY_TRACT | Status: DC

## 2023-01-15 MED ORDER — SODIUM CHLORIDE 0.9 % IV SOLN
150.0000 mg | Freq: Once | INTRAVENOUS | Status: AC
  Administered 2023-01-15: 150 mg via INTRAVENOUS
  Filled 2023-01-15: qty 5

## 2023-01-15 MED ORDER — GADOBUTROL 1 MMOL/ML IV SOLN
7.0000 mL | Freq: Once | INTRAVENOUS | Status: AC | PRN
  Administered 2023-01-15: 7 mL via INTRAVENOUS

## 2023-01-15 MED ORDER — BOOST / RESOURCE BREEZE PO LIQD CUSTOM
1.0000 | Freq: Three times a day (TID) | ORAL | Status: DC
  Administered 2023-01-15 – 2023-01-21 (×6): 1 via ORAL

## 2023-01-15 MED ORDER — PALONOSETRON HCL INJECTION 0.25 MG/5ML
0.2500 mg | Freq: Once | INTRAVENOUS | Status: AC
  Administered 2023-01-15: 0.25 mg via INTRAVENOUS
  Filled 2023-01-15: qty 5

## 2023-01-15 MED ORDER — SODIUM CHLORIDE 1 G PO TABS: 1.0000 g | ORAL_TABLET | Freq: Two times a day (BID) | ORAL | Status: DC

## 2023-01-15 MED ORDER — GUAIFENESIN ER 600 MG PO TB12
1200.0000 mg | ORAL_TABLET | Freq: Two times a day (BID) | ORAL | Status: DC
  Administered 2023-01-15 – 2023-01-21 (×12): 1200 mg via ORAL
  Filled 2023-01-15 (×12): qty 2

## 2023-01-15 MED ORDER — IPRATROPIUM BROMIDE 0.02 % IN SOLN
0.5000 mg | Freq: Two times a day (BID) | RESPIRATORY_TRACT | Status: DC
  Administered 2023-01-15 – 2023-01-16 (×2): 0.5 mg via RESPIRATORY_TRACT
  Filled 2023-01-15 (×2): qty 2.5

## 2023-01-15 MED ORDER — ACETAMINOPHEN 325 MG PO TABS
650.0000 mg | ORAL_TABLET | Freq: Once | ORAL | Status: AC
  Administered 2023-01-15: 650 mg via ORAL
  Filled 2023-01-15: qty 2

## 2023-01-15 MED ORDER — DIPHENHYDRAMINE HCL 25 MG PO CAPS
50.0000 mg | ORAL_CAPSULE | Freq: Once | ORAL | Status: AC
  Administered 2023-01-15: 50 mg via ORAL
  Filled 2023-01-15: qty 2

## 2023-01-15 MED ORDER — FAM-TRASTUZUMAB DERUXTECAN-NXKI CHEMO 100 MG IV SOLR
3.2000 mg/kg | Freq: Once | INTRAVENOUS | Status: AC
  Administered 2023-01-15: 200 mg via INTRAVENOUS
  Filled 2023-01-15: qty 10

## 2023-01-15 MED ORDER — LEVALBUTEROL HCL 0.63 MG/3ML IN NEBU: 0.6300 mg | INHALATION_SOLUTION | Freq: Two times a day (BID) | RESPIRATORY_TRACT | Status: DC

## 2023-01-15 MED ORDER — FUROSEMIDE 10 MG/ML IJ SOLN
40.0000 mg | Freq: Once | INTRAMUSCULAR | Status: AC
  Administered 2023-01-15: 40 mg via INTRAVENOUS
  Filled 2023-01-15: qty 4

## 2023-01-15 MED FILL — Fosaprepitant Dimeglumine For IV Infusion 150 MG (Base Eq): INTRAVENOUS | Qty: 5 | Status: AC

## 2023-01-15 MED FILL — Dexamethasone Sodium Phosphate Inj 100 MG/10ML: INTRAMUSCULAR | Qty: 1 | Status: AC

## 2023-01-15 NOTE — Care Management Important Message (Signed)
Important Message  Patient Details IM Letter given Name: Theresa [name] MRN: [id number] Date of Birth: 15-Aug-1956   Medicare Important Message Given:  Yes     Caren Macadam 01/15/2023, 10:31 AM

## 2023-01-15 NOTE — Progress Notes (Signed)
Oncology HEMATOLOGY-ONCOLOGY PROGRESS NOTE  SUBJECTIVE: Patient appears to be jaundiced, experiencing abdominal bloating and nausea.  Awake alert and oriented    OBJECTIVE: REVIEW OF SYSTEMS:   Constitutional: Generalized weakness Eyes: Jaundice Ears, nose, mouth, throat, and face: Denies mucositis or sore throat     PHYSICAL EXAMINATION: ECOG PERFORMANCE STATUS: 3 - Symptomatic, >50% confined to bed  Vitals:   01/14/23 1606 01/15/23 0039  BP:  136/73  Pulse:  (!) 109  Resp:  18  Temp:  98.5 F (36.9 C)  SpO2: (!) 73% 90%   Filed Weights   01/11/23 0951 01/11/23 1312  Weight: 140 lb (63.5 kg) 146 lb 6.2 oz (66.4 kg)      LABORATORY DATA:  I have reviewed the data as listed    Latest Ref Rng & Units 01/15/2023    5:54 AM 01/14/2023    1:02 AM 01/13/2023    5:37 AM  CMP  Glucose 70 - 99 mg/dL 409  811  914   BUN 8 - 23 mg/dL 14  13  11    Creatinine 0.44 - 1.00 mg/dL 7.82  9.56  2.13   Sodium 135 - 145 mmol/L 132  128  133   Potassium 3.5 - 5.1 mmol/L 4.2  3.6  4.0   Chloride 98 - 111 mmol/L 103  101  104   CO2 22 - 32 mmol/L 20  20  21    Calcium 8.9 - 10.3 mg/dL 8.2  7.9  7.9   Total Protein 6.5 - 8.1 g/dL 5.9  5.5  5.5   Total Bilirubin 0.3 - 1.2 mg/dL 5.6  2.4  2.1   Alkaline Phos 38 - 126 U/L 292  286  264   AST 15 - 41 U/L 252  199  197   ALT 0 - 44 U/L 56  49  51     Lab Results  Component Value Date   WBC 7.3 01/15/2023   HGB 8.4 (L) 01/15/2023   HCT 25.6 (L) 01/15/2023   MCV 101.6 (H) 01/15/2023   PLT 25 (LL) 01/15/2023   NEUTROABS 2.7 01/11/2023    ASSESSMENT AND PLAN: 1.  Metastatic breast cancer: Worsened bilirubin.  I am concerned about biliary obstruction.  We will obtain a direct bilirubin and also order an MRCP of the liver for further evaluation.  We will need to evaluate if there is any blockage that could be amenable to a stent.  If not a percutaneous biliary drain may need to be considered. 2. I explained to the patient and her husband  that this is a setback in her planning of treatment. 3.  Pancytopenia: Secondary to metastatic breast cancer involving the bone marrow.  For any procedures patient might need a platelet transfusion.  We can wait to see what procedure she will need. 4.  The original plan was to treat her with Enhertu starting Monday.  However the elevation of bilirubin takes precedence and we will have to figure out when she can be safely discharged at this point.

## 2023-01-15 NOTE — Progress Notes (Signed)
Spoke with Dr. Pamelia Hoit and we will treat with enHertu regardless of blood counts due to patient progressing rapidly.  Demetrius Charity, PharmD

## 2023-01-15 NOTE — Consult Note (Addendum)
NAME:  Theresa [name], MRN:  [id number], DOB:  [birth year], LOS: 3 ADMISSION DATE:  01/11/2023 CONSULTATION DATE:  01/15/2023 REFERRING MD:  Theresa [name] - [facility], CHIEF COMPLAINT:  SOB/dyspnea  History of Present Illness:  Theresa [name] is seen in consultation at the request of Dr. Sunnie [name] [facility]) for recommendations on further evaluation and management of SOB/dyspnea.  67 year old woman who presented to [facility] ED 5/13 for SOB/hypoxia. PMHx significant for HTN, vertigo, breast CA (IDC of R breast, metastatic to bone/liver) s/p lumpectomy and XRT (12/2019) as well as tamoxifen. CT Chest obtained 09/19/2022 demonstrated "patchy tree-in-bud nodularities, c/w chronic inflammation vs atypical infection such as MAC). Subsequent PET 10/07/2022 did not demonstrate findings of pulmonary metastatic disease. She has been on Verzenio (with letrozole) since 10/21/2022. Recently underwent RFA/kyphoplasty for T9 pathologic compression fracture r/t osseous metastatic disease 12/10/2022. Rivka Barbara was added to treatment regimen 12/16/2022.  On chart review, patient called 5/3 with concern for sinus drainage, back pain and nausea r/t mucous. Had tried Mucinex without relief and was given recommendations for supportive care. Patient called back 5/13 with SOB/dyspnea, ?cyanosis and SpO2 72% on home pulse oximeter. Subsequently presented to [facility] ED with SOB/DOE, productive cough and hypoxia. On ED presentation, she was afebrile, tachycardic to 102, normotensive with BP 139/74, RR 16, SpO2 88%. CXR was negative for acute process; CTA Chest negative for PE, mild patchy GGOs bilaterally. Admitted to [facility] for further care.  PCCM consulted for persistent SOB/hypoxia.  Pertinent Medical History:   Past Medical History:  Diagnosis Date   Breast cancer, right (HCC)    Contact lens/glasses fitting    wears contacts or glasses   Diarrhea    --post gallbladder surgery   Family history of bone cancer    Family history of brain cancer     Family history of stomach cancer    Family history of uterine cancer    Fibroids    History of radiation therapy    Hypertension    No pertinent past medical history    Personal history of radiation therapy    PONV (postoperative nausea and vomiting)    Right ovarian cyst    Vertigo    Significant Hospital Events: Including procedures, antibiotic start and stop dates in addition to other pertinent events   5/13 - Presented to [facility] ED for SOB/dyspnea, hypoxia to 70s. Subsequently admitted to [facility] CXR unremarkable. CTA Chest negative for PE, +mild patchy GGOs bilaterally. 5/17 - PCCM consulted for persistent SOB/hypoxia.  Interim History / Subjective:  PCCM consulted for further pulmonary recommendations  Objective:  Blood pressure 133/70, pulse (!) 102, temperature 99.2 F (37.3 C), temperature source Oral, resp. rate 16, height 5\' 5"  (1.651 m), weight 66.4 kg, last menstrual period 09/01/2007, SpO2 93 %.        Intake/Output Summary (Last 24 hours) at 01/15/2023 1323 Last data filed at 01/15/2023 1200 Gross per 24 hour  Intake 120 ml  Output --  Net 120 ml   Filed Weights   01/11/23 0951 01/11/23 1312  Weight: 63.5 kg 66.4 kg   Physical Examination: General: Acutely ill-appearing middle-aged woman in NAD. Appears uncomfortable. HEENT: [place]/AT, anicteric sclera, PERRL, moist mucous membranes. Neuro: Awake, oriented x 4. Slightly lethargic. Responds to verbal stimuli. Following commands consistently. Moves all 4 extremities spontaneously. CV: RRR, no m/g/r. PULM: Breathing even and mildly labored on 3LNC. Lung fields decreased at bases bilaterally. GI: Soft, nontender, nondistended. Extremities: Trace bilateral symmetric LE edema noted. Skin: Warm/dry, no rashes.  [facility]  Problem List:    Assessment & Plan:  Theresa [name] is seen in consultation at the request of Dr. Sunnie [name] [facility]) for recommendations on further evaluation and management of  SOB/dyspnea.  67 year old woman who presented to [facility] ED 5/13 for SOB/hypoxia. PMHx significant for HTN, vertigo, breast CA (IDC of R breast, metastatic to bone/liver) s/p lumpectomy and XRT (12/2019) as well as tamoxifen. Patient has had persistent SOB since kyphoplasty, compounded by recent severe URI ~5/3. COVID was negative on admission. It is possible (given CXR findings of mild interstitial edema/infiltrate patchy GGOs on CT) that patient has a post-viral pneumonitis or perhaps residual inflammation/edema. Pain/splinting could also be contributory, though patient denies significant pain at present. Also possible (though less likely) that patient has developed a shunt, Echo is ordered per TRH.  SOB with dyspnea Acute hypoxemic respiratory insufficiency - Continue supplemental O2 support - Wean O2 for sat > 90% - Aggressive pulmonary hygiene (IS/flutter, OOB/mobilization, chest PT as tolerated) - Pain control to prevent splinting - Continue ceftriaxone for CAP coverage  - May benefit from diuresis, will trial Lasix 40mg  IV x 1 - Receiving Decadron 10mg  prior to chemotherapy today (per Onc) - F/u Echo  Best Practice: (right click and "Reselect all SmartList Selections" daily)   Per Primary Team  Labs:  CBC: Recent Labs  Lab 01/11/23 1011 01/11/23 1015 01/12/23 0552 01/13/23 0537 01/14/23 0102 01/15/23 0554  WBC 4.9  --  4.6 4.8 4.9 7.3  NEUTROABS 2.7  --   --   --   --   --   HGB 10.1* 11.2* 8.8* 8.2* 7.8* 8.4*  HCT 30.8* 33.0* 27.8* 25.5* 24.0* 25.6*  MCV 101.3*  --  103.3* 103.7* 102.1* 101.6*  PLT 40*  --  35* 29* 50* 25*   Basic Metabolic Panel: Recent Labs  Lab 01/11/23 1011 01/11/23 1015 01/12/23 0552 01/13/23 0537 01/14/23 0102 01/15/23 0554  NA 136 135 134* 133* 128* 132*  K 4.6 4.6 4.0 4.0 3.6 4.2  CL 104  --  106 104 101 103  CO2 21*  --  20* 21* 20* 20*  GLUCOSE 104*  --  100* 113* 125* 109*  BUN 14  --  10 11 13 14   CREATININE 0.94  --  0.85 0.91  0.70 0.69  CALCIUM 9.2  --  8.1* 7.9* 7.9* 8.2*   GFR: Estimated Creatinine Clearance: 62.2 mL/min (by C-G formula based on SCr of 0.69 mg/dL). Recent Labs  Lab 01/12/23 0552 01/13/23 0537 01/13/23 2154 01/14/23 0102 01/15/23 0554  PROCALCITON  --   --   --   --  0.63  WBC 4.6 4.8  --  4.9 7.3  LATICACIDVEN  --   --  1.9  --   --    Liver Function Tests: Recent Labs  Lab 01/11/23 1011 01/12/23 0552 01/13/23 0537 01/14/23 0102 01/15/23 0554  AST 193* 189* 197* 199* 252*  ALT 50* 49* 51* 49* 56*  ALKPHOS 324* 277* 264* 286* 292*  BILITOT 1.7* 1.9* 2.1* 2.4* 5.6*  PROT 6.6 6.1* 5.5* 5.5* 5.9*  ALBUMIN 3.8 2.9* 2.6* 2.6* 2.7*   Recent Labs  Lab 01/11/23 1011  LIPASE 85*   No results for input(s): "AMMONIA" in the last 168 hours.  ABG:    Component Value Date/Time   HCO3 20.1 01/11/2023 1015   TCO2 21 (L) 01/11/2023 1015   ACIDBASEDEF 4.0 (H) 01/11/2023 1015   O2SAT 54 01/11/2023 1015    Coagulation Profile: Recent Labs  Lab 01/11/23  1114  INR 1.4*   Cardiac Enzymes: No results for input(s): "CKTOTAL", "CKMB", "CKMBINDEX", "TROPONINI" in the last 168 hours.  HbA1C: Hgb A1c MFr Bld  Date/Time Value Ref Range Status  01/12/2020 01:31 PM 5.6 4.8 - 5.6 % Final    Comment:             Prediabetes: 5.7 - 6.4          Diabetes: >6.4          Glycemic control for adults with diabetes: <7.0    CBG: No results for input(s): "GLUCAP" in the last 168 hours.  Review of Systems:   Review of systems completed with pertinent positives/negatives outlined in above HPI.  Past Medical History:  She,  has a past medical history of Breast cancer, right (HCC), Contact lens/glasses fitting, Diarrhea, Family history of bone cancer, Family history of brain cancer, Family history of stomach cancer, Family history of uterine cancer, Fibroids, History of radiation therapy, Hypertension, No pertinent past medical history, Personal history of radiation therapy, PONV (postoperative  nausea and vomiting), Right ovarian cyst, and Vertigo.   Surgical History:   Past Surgical History:  Procedure Laterality Date   BLADDER SUSPENSION     BREAST BIOPSY  09/08/2012   Procedure: BREAST BIOPSY;  Surgeon: Adolph Pollack, MD;  Location: [facility];  Service: General;  Laterality: Left;  remove left breast mass   BREAST LUMPECTOMY Right 10/2019   BREAST LUMPECTOMY WITH RADIOACTIVE SEED AND SENTINEL LYMPH NODE BIOPSY Right 10/04/2019   Procedure: RIGHT BREAST LUMPECTOMY WITH BRACKETED RADIOACTIVE SEEDS, RIGHT BREAST RADIOACTIVE SEED GUIDED EXCISION BIOPSY, AND RIGHT SENTINEL LYMPH NODE BIOPSY;  Surgeon: Almond Lint, MD;  Location: [facility];  Service: General;  Laterality: Right;   BREAST SURGERY     lumpectomy x2   BUNIONECTOMY Right 01/2016   CHOLECYSTECTOMY     COLONOSCOPY     CYSTOSCOPY N/A 01/16/2020   Procedure: CYSTOSCOPY;  Surgeon: Patton Salles, MD;  Location: [facility];  Service: Gynecology;  Laterality: N/A;   IR BONE TUMOR(S)RF ABLATION  12/10/2022   IR KYPHO THORACIC WITH BONE BIOPSY  12/10/2022   IR RADIOLOGIST EVAL & MGMT  12/24/2022   MASTOPEXY Right 10/04/2019   Procedure: RIGHT BREAST MASTOPEXY;  Surgeon: Almond Lint, MD;  Location:  SURGERY CENTER;  Service: General;  Laterality: Right;   RE-EXCISION OF BREAST LUMPECTOMY Right 10/31/2019   Procedure: RIGHT RE-EXCISION OF BREAST LUMPECTOMY;  Surgeon: Almond Lint, MD;  Location: [facility];  Service: General;  Laterality: Right;   TOTAL LAPAROSCOPIC HYSTERECTOMY WITH BILATERAL SALPINGO OOPHORECTOMY N/A 01/16/2020   Procedure: TOTAL LAPAROSCOPIC HYSTERECTOMY WITH BILATERAL SALPINGO OOPHORECTOMY/COLLECTION OF PELVIC WASHINGS, LYSIS OF ADHESIONS;  Surgeon: Patton Salles, MD;  Location: [facility];  Service: Gynecology;  Laterality: N/A;  collection of pelvic washings   TUBAL LIGATION      Social History:    reports that she quit smoking about 26 years ago. Her smoking use included cigarettes. She has never used smokeless tobacco. She reports that she does not currently use alcohol. She reports that she does not use drugs.   Family History:  Her family history includes Bone cancer (age of onset: 71) in her paternal grandfather; Brain cancer (age of onset: 51) in her maternal aunt; Cancer in her maternal grandmother; Dementia in her mother; Depression in her mother; Heart disease in her sister; Hypertension in her father; Stomach cancer  in her father; Thyroid disease in her sister; Uterine cancer in her cousin; Uterine cancer (age of onset: 89) in her cousin; Uterine cancer (age of onset: 71) in her paternal aunt.   Allergies: Allergies  Allergen Reactions   Metronidazole Other (See Comments)    C-Diff C-Diff C-Diff   Other Other (See Comments) and Rash    Dermabond - redness and burning, blistering Dermabond - redness and burning, blistering Dermabond - redness and burning, blistering "burning" skin   Tape Rash    "burning" skin "burning" skin   Home Medications: Prior to Admission medications   Medication Sig Start Date End Date Taking? Authorizing Provider  abemaciclib (VERZENIO) 100 MG tablet Take 1 tablet (100 mg total) by mouth 2 (two) times daily. 12/09/22  Yes Serena Croissant, MD  famotidine (PEPCID) 10 MG tablet Take 10 mg by mouth 2 (two) times daily.   Yes [provider]  gabapentin (NEURONTIN) 300 MG capsule Take 1 capsule (300 mg total) by mouth at bedtime. 06/30/22  Yes Serena Croissant, MD  guaiFENesin (MUCINEX) 600 MG 12 hr tablet Take 600 mg by mouth 2 (two) times daily.   Yes [provider]  letrozole (FEMARA) 2.5 MG tablet TAKE 1 TABLET BY MOUTH EVERY DAY 10/23/22  Yes Serena Croissant, MD  LORazepam (ATIVAN) 0.5 MG tablet TAKE 1 TABLET BY MOUTH EVERY 8 HOURS AS NEEDED FOR ANXIETY. Patient taking differently: Take 0.5 mg by mouth every 8 (eight) hours as needed  for anxiety. for anxiety 12/15/22  Yes Serena Croissant, MD  ondansetron (ZOFRAN) 8 MG tablet Take 1 tablet (8 mg total) by mouth every 8 (eight) hours as needed for nausea or vomiting. 11/03/22  Yes Serena Croissant, MD  oxyCODONE-acetaminophen (PERCOCET/ROXICET) 5-325 MG tablet Take 1 tablet by mouth every 6 (six) hours as needed for severe pain. 12/16/22  Yes Serena Croissant, MD  valsartan (DIOVAN) 40 MG tablet Take 40 mg by mouth daily. 05/06/21  Yes [provider]  venlafaxine XR (EFFEXOR-XR) 37.5 MG 24 hr capsule Take 1 capsule (37.5 mg total) by mouth daily. 02/19/22  Yes Iruku, Burnice Logan, MD  dexamethasone (DECADRON) 4 MG tablet Take 1 tablet (4 mg total) by mouth daily. Take 1 tablet day chemotherapy 1 tablet 2 days after chemo with food 01/13/23   Serena Croissant, MD  hydrocortisone 2.5 % cream Apply topically 3 (three) times daily as needed. Patient not taking: Reported on 11/03/2022 12/11/19   Lonie Peak, MD  loperamide (IMODIUM) 2 MG capsule Take 2 mg by mouth in the morning and at bedtime.  Patient not taking: Reported on 01/11/2023    [provider]  ondansetron (ZOFRAN) 8 MG tablet Take 1 tablet (8 mg total) by mouth every 8 (eight) hours as needed for nausea or vomiting. Start on the third day after chemotherapy. 01/13/23   Serena Croissant, MD  prochlorperazine (COMPAZINE) 10 MG tablet Take 1 tablet (10 mg total) by mouth every 6 (six) hours as needed for nausea or vomiting. Patient not taking: Reported on 01/11/2023 11/03/22   Serena Croissant, MD  prochlorperazine (COMPAZINE) 10 MG tablet Take 1 tablet (10 mg total) by mouth every 6 (six) hours as needed for nausea or vomiting. 01/13/23   Serena Croissant, MD  triamcinolone (KENALOG) 0.025 % ointment Apply 1 Application topically 2 (two) times daily. Use for one week at a time. Patient not taking: Reported on 11/03/2022 07/08/22   Patton Salles, MD    Signature:   Tim Lair, PA-C  [facility] 01/15/23  1:23 PM  Please see [email or web address] for pager details.  From 7A-7P if no response, please call [phone] After hours, please call ELink [phone]

## 2023-01-15 NOTE — Plan of Care (Signed)
  Problem: Health Behavior/Discharge Planning: Goal: Ability to manage health-related needs will improve Outcome: Not Progressing   Problem: Clinical Measurements: Goal: Respiratory complications will improve Outcome: Not Progressing   Problem: Activity: Goal: Risk for activity intolerance will decrease Outcome: Not Progressing   Problem: Nutrition: Goal: Adequate nutrition will be maintained Outcome: Not Progressing   Problem: Pain Managment: Goal: General experience of comfort will improve Outcome: Not Progressing   Problem: Skin Integrity: Goal: Risk for impaired skin integrity will decrease Outcome: Not Progressing   Problem: Education: Goal: Knowledge of General Education information will improve Description: Including pain rating scale, medication(s)/side effects and non-pharmacologic comfort measures Outcome: Progressing   Problem: Coping: Goal: Level of anxiety will decrease Outcome: Progressing   Problem: Elimination: Goal: Will not experience complications related to bowel motility Outcome: Progressing Goal: Will not experience complications related to urinary retention Outcome: Progressing   Problem: Safety: Goal: Ability to remain free from injury will improve Outcome: Progressing

## 2023-01-15 NOTE — Progress Notes (Signed)
Date and time results received: 01/15/23 0655  Test: Platelets Critical Value: 25  Name of Provider Notified: day shift nurse making aware  Orders Received? Or Actions Taken?:

## 2023-01-15 NOTE — Progress Notes (Signed)
PROGRESS NOTE    Theresa [name]  ZOX:[id number] DOB: 01-30-1956 DOA: 01/11/2023 PCP: Carylon Perches, MD   Brief Narrative: 67 year old past medical history significant for breast cancer with metastasis to liver and spine on chemotherapy and radiation therapy presented to the ED with shortness of breath, fatigue, productive cough.  CT was negative for PE, showed groundglass opacity.  Patient was a started on IV antibiotics.  Patient developed worsening thrombocytopenia during hospitalization.  She continued to require oxygen supplementation.  She developed worsening hyperbilirubinemia and transaminases.  MRCP has been ordered.  Depending on results she might need GI or IR consultation.  For the acute on chronic respiratory failure, continue to remain on 3 L of oxygen, continue with IV antibiotics.  Pulmonologist has been consulted.    Assessment & Plan:   Principal Problem:   Acute hypoxic respiratory failure (HCC) Active Problems:   Cancer, metastatic to bone (HCC)   PNA (pneumonia)   Hypoxic respiratory failure (HCC)   LFT elevation   Hepatomegaly   Compression fracture of T9 vertebra (HCC)   Compression fracture of T10 vertebra (HCC)   Pneumonia   1-Acute Hypoxic respiratory Failure:  -Patient presented with shortness of breath, cough, CT angio show groundglass opacities suspicious for multifocal pneumonia. -Patient has completed 5 days of azithromycin 5/17.  Continue with IV ceftriaxone. -There was some concern about lymphangitic spread of the tumor by oncology. -Continues to require 3 L of oxygen at rest, she might need higher amount of ambulation. -Will schedule guaifenesin and nebulizer -BNP 400 range, will proceed with 2D echo. -Pulmonologist has been consulted she appears to be more tachypneic today, will defer Lasix and Steroids to pulmonologist.  -Check MRSA PCR.   2-Right side breast Ca, metastatic diseases to liver and spine.  -S/p lumpectomy.  She was on letrozole and  Verzenio.  Currently on hold. She will complete radiation treatments today. -Dr. Georgiann Mohs following and appreciate assistance  3-Severe thrombocytopenia: -Monitor for bleeding. Could be in the setting of infection, side effect of chemotherapy, metastatic disease. -She received 2 units of platelets on 5/15.  Platelet increased to 50--- now down to 25 -She will need platelets transfusion if she requires any procedure   4-Hyperbilirubinemia , transaminases.  -Worsening LFT today, Bilirubin increase to 5.  -Plan t proceed with MRCP- to further evaluate.   5-Hyponatremia:  Improved with sodium tablet.  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  Change sodium tablet to BID.   6-Macrocytic anemia, in the setting of malignancy.  She received 1 unit of packed red blood cell 5/15, transfusion had to be stopped because of concern of transfusion reaction with fever.  7-Debility/deconditioning: She will need home health PT  8-goals of care: Patient with poor prognosis due to metastatic disease.  T10 vertebra compression fracture: Continue with supportive care pain management.  -Status post kyphoplasty for T9 compression fracture. Nutrition Problem: Increased nutrient needs Etiology: cancer and cancer related treatments    Signs/Symptoms: estimated needs    Interventions: Ensure Enlive (each supplement provides 350kcal and 20 grams of protein)  Estimated body mass index is 24.36 kg/m as calculated from the following:   Height as of this encounter: 5\' 5"  (1.651 m).   Weight as of this encounter: 66.4 kg.   DVT prophylaxis: SCD Code Status: Full code Family Communication: Family at bedside Disposition Plan:  Status is: Inpatient Remains inpatient appropriate because: management, resp failure    Consultants:  Pulmonologist Dr Pamelia Hoit  Procedures:    Antimicrobials:  Subjective: She is sleepy, received ativan for MRCP. She report worsening dyspnea, persistent cough, thick phlegm.  She report  nausea, mild abdominal distension. Although after having BM yesterday, distension is better.  Per husband; she has poor appetite, she has not been eating. She vomited yesterday after she got a "gummy med"  Objective: Vitals:   01/14/23 0544 01/14/23 1401 01/14/23 1606 01/15/23 0039  BP: 113/66 126/64  136/73  Pulse: 93 (!) 111  (!) 109  Resp: 14 18  18   Temp: 98 F (36.7 C) 97.9 F (36.6 C)  98.5 F (36.9 C)  TempSrc: Axillary Oral  Oral  SpO2:  94% (!) 73% 90%  Weight:      Height:        Intake/Output Summary (Last 24 hours) at 01/15/2023 0733 Last data filed at 01/14/2023 0940 Gross per 24 hour  Intake 240 ml  Output --  Net 240 ml   Filed Weights   01/11/23 0951 01/11/23 1312  Weight: 63.5 kg 66.4 kg    Examination:  General exam: sleepy, sick appearing.  Respiratory system: Tachypnea, BL ronchus, no wheezing.  Cardiovascular system: S1 & S2 heard, RRR. Positive +JVD Gastrointestinal system: Abdomen is nondistended, soft and nontender. Central nervous system: sleepy  Extremities: Symmetric 5 x 5 power.   Data Reviewed: I have personally reviewed following labs and imaging studies  CBC: Recent Labs  Lab 01/11/23 1011 01/11/23 1015 01/12/23 0552 01/13/23 0537 01/14/23 0102 01/15/23 0554  WBC 4.9  --  4.6 4.8 4.9 7.3  NEUTROABS 2.7  --   --   --   --   --   HGB 10.1* 11.2* 8.8* 8.2* 7.8* 8.4*  HCT 30.8* 33.0* 27.8* 25.5* 24.0* 25.6*  MCV 101.3*  --  103.3* 103.7* 102.1* 101.6*  PLT 40*  --  35* 29* 50* 25*   Basic Metabolic Panel: Recent Labs  Lab 01/11/23 1011 01/11/23 1015 01/12/23 0552 01/13/23 0537 01/14/23 0102 01/15/23 0554  NA 136 135 134* 133* 128* 132*  K 4.6 4.6 4.0 4.0 3.6 4.2  CL 104  --  106 104 101 103  CO2 21*  --  20* 21* 20* 20*  GLUCOSE 104*  --  100* 113* 125* 109*  BUN 14  --  10 11 13 14   CREATININE 0.94  --  0.85 0.91 0.70 0.69  CALCIUM 9.2  --  8.1* 7.9* 7.9* 8.2*   GFR: Estimated Creatinine Clearance: 62.2 mL/min (by  C-G formula based on SCr of 0.69 mg/dL). Liver Function Tests: Recent Labs  Lab 01/11/23 1011 01/12/23 0552 01/13/23 0537 01/14/23 0102 01/15/23 0554  AST 193* 189* 197* 199* 252*  ALT 50* 49* 51* 49* 56*  ALKPHOS 324* 277* 264* 286* 292*  BILITOT 1.7* 1.9* 2.1* 2.4* 5.6*  PROT 6.6 6.1* 5.5* 5.5* 5.9*  ALBUMIN 3.8 2.9* 2.6* 2.6* 2.7*   Recent Labs  Lab 01/11/23 1011  LIPASE 85*   No results for input(s): "AMMONIA" in the last 168 hours. Coagulation Profile: Recent Labs  Lab 01/11/23 1114  INR 1.4*   Cardiac Enzymes: No results for input(s): "CKTOTAL", "CKMB", "CKMBINDEX", "TROPONINI" in the last 168 hours. BNP (last 3 results) No results for input(s): "PROBNP" in the last 8760 hours. HbA1C: No results for input(s): "HGBA1C" in the last 72 hours. CBG: No results for input(s): "GLUCAP" in the last 168 hours. Lipid Profile: No results for input(s): "CHOL", "HDL", "LDLCALC", "TRIG", "CHOLHDL", "LDLDIRECT" in the last 72 hours. Thyroid Function Tests: No results for  input(s): "TSH", "T4TOTAL", "FREET4", "T3FREE", "THYROIDAB" in the last 72 hours. Anemia Panel: Recent Labs    01/13/23 0933  VITAMINB12 3,836*  FOLATE 15.1   Sepsis Labs: Recent Labs  Lab 01/13/23 2154  LATICACIDVEN 1.9    Recent Results (from the past 240 hour(s))  SARS Coronavirus 2 by RT PCR (hospital order, performed in [facility] hospital lab) *cepheid single result test* Anterior Nasal Swab     Status: None   Collection Time: 01/11/23 10:11 AM   Specimen: Anterior Nasal Swab  Result Value Ref Range Status   SARS Coronavirus 2 by RT PCR NEGATIVE NEGATIVE Final    Comment: (NOTE) SARS-CoV-2 target nucleic acids are NOT DETECTED.  The SARS-CoV-2 RNA is generally detectable in upper and lower respiratory specimens during the acute phase of infection. The lowest concentration of SARS-CoV-2 viral copies this assay can detect is 250 copies / mL. A negative result does not preclude SARS-CoV-2  infection and should not be used as the sole basis for treatment or other patient management decisions.  A negative result may occur with improper specimen collection / handling, submission of specimen other than nasopharyngeal swab, presence of viral mutation(s) within the areas targeted by this assay, and inadequate number of viral copies (<250 copies / mL). A negative result must be combined with clinical observations, patient history, and epidemiological information.  Fact Sheet for Patients:   RoadLapTop.co.za  Fact Sheet for Healthcare Providers: http://kim-miller.com/  This test is not yet approved or  cleared by the [place] FDA and has been authorized for detection and/or diagnosis of SARS-CoV-2 by FDA under an Emergency Use Authorization (EUA).  This EUA will remain in effect (meaning this test can be used) for the duration of the COVID-19 declaration under Section 564(b)(1) of the Act, 21 U.S.C. section 360bbb-3(b)(1), unless the authorization is terminated or revoked sooner.  Performed at [facility], [address], [place], [place] [address]          Radiology Studies: No results found.      Scheduled Meds:  famotidine  10 mg Oral BID   feeding supplement  237 mL Oral BID BM   gabapentin  300 mg Oral QHS   irbesartan  37.5 mg Oral Daily   sodium chloride  1 g Oral TID WC   venlafaxine XR  37.5 mg Oral Daily   Continuous Infusions:  azithromycin 500 mg (01/14/23 1250)   cefTRIAXone (ROCEPHIN)  IV Stopped (01/14/23 1139)     LOS: 3 days    Time spent: 35 minutes    Daveyon Kitchings A Kayton Dunaj, MD [facility]   If 7PM-7AM, please contact night-coverage [email or web address]  01/15/2023, 7:33 AM

## 2023-01-15 NOTE — Progress Notes (Signed)
Enhertu 200 mg administered in D5 via L PIV.    Premeds given and Blood return noted with L PIV.    Pt sleeping. Spouse at [facility].  Will continue to monitor.

## 2023-01-15 NOTE — Progress Notes (Signed)
Physical Therapy Treatment Patient Details Name: Theresa [name] MRN: [id number] DOB: September 01, 1955 Today's Date: 01/15/2023   History of Present Illness 67 yo female admitted to hospital on 01/11/2023 due to SOB, persistent cough and worsening back pain. Pt was found to be hypoxic secondary to atypical PNA and is now requiring supplemental O2 as well as worsening LFTs. Imaging revealed T9 and T10 compression fx. Pt fitted for TLSO. Pt has significant PMH for metastatic breast ca to bone and liver, s/p chemotherapy and radiation, HTN and vertigo.    PT Comments    Pt reports she's very fatigued from multiple tests and radiation today. She ambulated 10' around the foot of the bed and could not tolerate further ambulation 2* fatigue/SOB. Instructed pt in BLE strengthening exercises and reviewed pursed lip breathing and use of incentive spirometer.     Recommendations for follow up therapy are one component of a multi-disciplinary discharge planning process, led by the attending physician.  Recommendations may be updated based on patient status, additional functional criteria and insurance authorization.  Follow Up Recommendations       Assistance Recommended at Discharge Intermittent Supervision/Assistance  Patient can return home with the following A little help with walking and/or transfers;A little help with bathing/dressing/bathroom;Assistance with cooking/housework;Assist for transportation;Help with stairs or ramp for entrance   Equipment Recommendations  Rollator (4 wheels);Wheelchair (measurements PT);Wheelchair cushion (measurements PT)    Recommendations for Other Services       Precautions / Restrictions Precautions Precautions: Fall Precaution Comments: monitor O2 (Monitor O2, may need 4L for ambulation) Required Braces or Orthoses: Spinal Brace (Has TLSO in room, which she declines to donn) Spinal Brace: Thoracolumbosacral orthotic (for comfort) Restrictions Weight Bearing  Restrictions: No     Mobility  Bed Mobility Overal bed mobility: Modified Independent         Sit to supine: Modified independent (Device/Increase time), HOB elevated        Transfers Overall transfer level: Needs assistance Equipment used: None Transfers: Sit to/from Stand Sit to Stand: Min guard                Ambulation/Gait Ambulation/Gait assistance: Min guard Gait Distance (Feet): 10 Feet Assistive device: None Gait Pattern/deviations: Step-through pattern, Decreased stride length Gait velocity: decreased     General Gait Details: distance limited by fatigue, pt reports she's tired from multiple tests and radiation today, and feels short of breath with activity, pt ambulated around foot of bed frequently reaching for the footboard and bed for single UE support   Stairs             Wheelchair Mobility    Modified Rankin (Stroke Patients Only)       Balance   Sitting-balance support: No upper extremity supported, Feet supported Sitting balance-Leahy Scale: Good     Standing balance support: During functional activity, Single extremity supported Standing balance-Leahy Scale: Fair                              Cognition Arousal/Alertness: Awake/alert Behavior During Therapy: WFL for tasks assessed/performed Overall Cognitive Status: Within Functional Limits for tasks assessed                                          Exercises General Exercises - Lower Extremity Ankle Circles/Pumps: AROM, Both, 10 reps, Supine Short Arc  Quad: AROM, Both, 5 reps, Supine    General Comments        Pertinent Vitals/Pain Pain Assessment Pain Score: 0-No pain    Home Living                          Prior Function            PT Goals (current goals can now be found in the care plan section) Acute Rehab PT Goals Patient Stated Goal: to go home PT Goal Formulation: With patient Time For Goal Achievement:  01/26/23 Potential to Achieve Goals: Good Progress towards PT goals: Progressing toward goals    Frequency    Min 1X/week      PT Plan Current plan remains appropriate    Co-evaluation              AM-PAC PT "6 Clicks" Mobility   Outcome Measure  Help needed turning from your back to your side while in a flat bed without using bedrails?: A Little Help needed moving from lying on your back to sitting on the side of a flat bed without using bedrails?: A Little Help needed moving to and from a bed to a chair (including a wheelchair)?: A Little Help needed standing up from a chair using your arms (e.g., wheelchair or bedside chair)?: A Little Help needed to walk in hospital room?: A Little Help needed climbing 3-5 steps with a railing? : A Little 6 Click Score: 18    End of Session   Activity Tolerance: No increased pain;Patient limited by fatigue Patient left: in bed;with bed alarm set;with call bell/phone within reach;with family/visitor present Nurse Communication: Mobility status PT Visit Diagnosis: Unsteadiness on feet (R26.81);Other abnormalities of gait and mobility (R26.89);Muscle weakness (generalized) (M62.81)     Time: 1431-1440 PT Time Calculation (min) (ACUTE ONLY): 9 min  Charges:  $Therapeutic Activity: 8-22 mins                     Ralene Bathe Kistler PT 01/15/2023  Acute Rehabilitation Services  Office [phone]

## 2023-01-16 ENCOUNTER — Inpatient Hospital Stay (HOSPITAL_COMMUNITY): Payer: Medicare HMO

## 2023-01-16 DIAGNOSIS — J9601 Acute respiratory failure with hypoxia: Secondary | ICD-10-CM

## 2023-01-16 LAB — CBC
HCT: 23.7 % — ABNORMAL LOW (ref 36.0–46.0)
Hemoglobin: 7.8 g/dL — ABNORMAL LOW (ref 12.0–15.0)
MCH: 33.5 pg (ref 26.0–34.0)
MCHC: 32.9 g/dL (ref 30.0–36.0)
MCV: 101.7 fL — ABNORMAL HIGH (ref 80.0–100.0)
Platelets: 15 10*3/uL — CL (ref 150–400)
RBC: 2.33 MIL/uL — ABNORMAL LOW (ref 3.87–5.11)
RDW: 22.5 % — ABNORMAL HIGH (ref 11.5–15.5)
WBC: 7.7 10*3/uL (ref 4.0–10.5)
nRBC: 9.7 % — ABNORMAL HIGH (ref 0.0–0.2)

## 2023-01-16 LAB — ECHOCARDIOGRAM COMPLETE BUBBLE STUDY
Area-P 1/2: 5.16 cm2
Calc EF: 59 %
S' Lateral: 2.6 cm
Single Plane A2C EF: 58 %
Single Plane A4C EF: 56.4 %

## 2023-01-16 LAB — COMPREHENSIVE METABOLIC PANEL
ALT: 65 U/L — ABNORMAL HIGH (ref 0–44)
AST: 296 U/L — ABNORMAL HIGH (ref 15–41)
Albumin: 2.6 g/dL — ABNORMAL LOW (ref 3.5–5.0)
Alkaline Phosphatase: 281 U/L — ABNORMAL HIGH (ref 38–126)
Anion gap: 9 (ref 5–15)
BUN: 22 mg/dL (ref 8–23)
CO2: 21 mmol/L — ABNORMAL LOW (ref 22–32)
Calcium: 7.9 mg/dL — ABNORMAL LOW (ref 8.9–10.3)
Chloride: 102 mmol/L (ref 98–111)
Creatinine, Ser: 0.61 mg/dL (ref 0.44–1.00)
GFR, Estimated: >60 mL/min (ref >60)
Glucose, Bld: 143 mg/dL — ABNORMAL HIGH (ref 70–99)
Potassium: 4.2 mmol/L (ref 3.5–5.1)
Sodium: 132 mmol/L — ABNORMAL LOW (ref 135–145)
Total Bilirubin: 5 mg/dL — ABNORMAL HIGH (ref 0.3–1.2)
Total Protein: 5.6 g/dL — ABNORMAL LOW (ref 6.5–8.1)

## 2023-01-16 LAB — TYPE AND SCREEN

## 2023-01-16 LAB — PREPARE RBC (CROSSMATCH)

## 2023-01-16 MED ORDER — LEVALBUTEROL HCL 0.63 MG/3ML IN NEBU
0.6300 mg | INHALATION_SOLUTION | Freq: Three times a day (TID) | RESPIRATORY_TRACT | Status: DC
  Administered 2023-01-16 – 2023-01-18 (×6): 0.63 mg via RESPIRATORY_TRACT
  Filled 2023-01-16 (×6): qty 3

## 2023-01-16 MED ORDER — FUROSEMIDE 10 MG/ML IJ SOLN
20.0000 mg | Freq: Once | INTRAMUSCULAR | Status: AC
  Administered 2023-01-16: 20 mg via INTRAVENOUS

## 2023-01-16 MED ORDER — IPRATROPIUM BROMIDE 0.02 % IN SOLN
0.5000 mg | Freq: Three times a day (TID) | RESPIRATORY_TRACT | Status: DC
  Administered 2023-01-16 – 2023-01-18 (×6): 0.5 mg via RESPIRATORY_TRACT
  Filled 2023-01-16 (×6): qty 2.5

## 2023-01-16 MED ORDER — SODIUM CHLORIDE 0.9% IV SOLUTION: Freq: Once | INTRAVENOUS | Status: AC

## 2023-01-16 MED ORDER — FUROSEMIDE 10 MG/ML IJ SOLN: 40.0000 mg | Freq: Once | INTRAMUSCULAR | Status: DC

## 2023-01-16 MED ORDER — FUROSEMIDE 10 MG/ML IJ SOLN
20.0000 mg | Freq: Once | INTRAMUSCULAR | Status: DC
  Filled 2023-01-16: qty 2

## 2023-01-16 MED ORDER — POTASSIUM CHLORIDE CRYS ER 20 MEQ PO TBCR
20.0000 meq | EXTENDED_RELEASE_TABLET | Freq: Once | ORAL | Status: AC
  Administered 2023-01-16: 20 meq via ORAL
  Filled 2023-01-16: qty 1

## 2023-01-16 MED ORDER — FUROSEMIDE 10 MG/ML IJ SOLN
20.0000 mg | Freq: Once | INTRAMUSCULAR | Status: AC
  Administered 2023-01-16: 20 mg via INTRAVENOUS
  Filled 2023-01-16: qty 2

## 2023-01-16 MED ORDER — ACETAMINOPHEN 325 MG PO TABS
650.0000 mg | ORAL_TABLET | Freq: Once | ORAL | Status: AC
  Administered 2023-01-16: 650 mg via ORAL
  Filled 2023-01-16: qty 2

## 2023-01-16 MED ORDER — DIPHENHYDRAMINE HCL 25 MG PO CAPS
25.0000 mg | ORAL_CAPSULE | Freq: Once | ORAL | Status: AC
  Administered 2023-01-16: 25 mg via ORAL
  Filled 2023-01-16: qty 1

## 2023-01-16 MED ORDER — FUROSEMIDE 10 MG/ML IJ SOLN
20.0000 mg | Freq: Once | INTRAMUSCULAR | Status: DC
Start: 2023-01-16 — End: 2023-01-16

## 2023-01-16 NOTE — Progress Notes (Signed)
Echocardiogram 2D Echocardiogram has been performed.  Toni Amend 01/16/2023, 11:02 AM

## 2023-01-16 NOTE — Progress Notes (Signed)
HEMATOLOGY-ONCOLOGY PROGRESS NOTE  SUBJECTIVE: Patient received first cycle of chemotherapy with Enhertu yesterday and tolerated it fairly well. Today patient thinks that her breathing has improved (possibly because of breathing treatments) She was also feeling like she wants to eat something for breakfast.    OBJECTIVE: REVIEW OF SYSTEMS:   Constitutional: Denies fevers, chills or abnormal weight loss Eyes: Yellowish discoloration Ears, nose, mouth, throat, and face: Denies mucositis or sore throat Respiratory: Denies cough, dyspnea or wheezes Cardiovascular: Denies palpitation, chest discomfort Gastrointestinal: Poor appetite denies nausea Skin: Jaundice Neurological: Generalized weaknesses Behavioral/Psych: Mood is stable, no new changes  Extremities: No lower extremity edema     PHYSICAL EXAMINATION: ECOG PERFORMANCE STATUS: 3 - Symptomatic, >50% confined to bed  Vitals:   01/15/23 2111 01/16/23 0447  BP: 118/67 113/67  Pulse: (!) 104 90  Resp: 18 18  Temp: 98.5 F (36.9 C) 98.2 F (36.8 C)  SpO2: 95% 97%   Filed Weights   01/11/23 0951 01/11/23 1312  Weight: 140 lb (63.5 kg) 146 lb 6.2 oz (66.4 kg)      LABORATORY DATA:  I have reviewed the data as listed    Latest Ref Rng & Units 01/15/2023    5:54 AM 01/14/2023    1:02 AM 01/13/2023    5:37 AM  CMP  Glucose 70 - 99 mg/dL 409  811  914   BUN 8 - 23 mg/dL 14  13  11    Creatinine 0.44 - 1.00 mg/dL 7.82  9.56  2.13   Sodium 135 - 145 mmol/L 132  128  133   Potassium 3.5 - 5.1 mmol/L 4.2  3.6  4.0   Chloride 98 - 111 mmol/L 103  101  104   CO2 22 - 32 mmol/L 20  20  21    Calcium 8.9 - 10.3 mg/dL 8.2  7.9  7.9   Total Protein 6.5 - 8.1 g/dL 5.9  5.5  5.5   Total Bilirubin 0.3 - 1.2 mg/dL 5.6  2.4  2.1   Alkaline Phos 38 - 126 U/L 292  286  264   AST 15 - 41 U/L 252  199  197   ALT 0 - 44 U/L 56  49  51     Lab Results  Component Value Date   WBC 7.3 01/15/2023   HGB 8.4 (L) 01/15/2023   HCT 25.6 (L)  01/15/2023   MCV 101.6 (H) 01/15/2023   PLT 25 (LL) 01/15/2023   NEUTROABS 2.7 01/11/2023    ASSESSMENT AND PLAN: 1.  Metastatic breast cancer with rapidly progressing liver metastases causing hyperbilirubinemia: Status post first cycle of chemotherapy with Enhertu.  She tolerated it extremely well. 2. severe jaundice: MRCP did not reveal any clear obstruction that could be amenable to stent or percutaneous drainage.  Therefore we had to start urgent chemotherapy 3.  Shortness of breath: Currently on antibiotic but it could be lymphangitic spread.  Uses oxygen. 4.  Anemia and thrombocytopenia: Secondary to bone marrow involvement.  Monitoring.  Transfusion support as needed. 5.  Will request IR for port placement   We will follow closely.

## 2023-01-16 NOTE — Progress Notes (Signed)
PROGRESS NOTE    Theresa [name]  UEA:[id number] DOB: [birth year] DOA: 01/11/2023 PCP: Carylon Perches, MD   Brief Narrative: 67 year old past medical history significant for breast cancer with metastasis to liver and spine on chemotherapy and radiation therapy presented to the ED with shortness of breath, fatigue, productive cough.  CT was negative for PE, showed groundglass opacity.  Patient was a started on IV antibiotics.  Patient developed worsening thrombocytopenia during hospitalization.  She continued to require oxygen supplementation.  She developed worsening hyperbilirubinemia and transaminases.  MRCP has been ordered.  Depending on results she might need GI or IR consultation.  For the acute on chronic respiratory failure, continue to remain on 3 L of oxygen, continue with IV antibiotics.  Pulmonologist has been consulted.    Assessment & Plan:   Principal Problem:   Acute hypoxic respiratory failure (HCC) Active Problems:   Cancer, metastatic to bone (HCC)   PNA (pneumonia)   Hypoxic respiratory failure (HCC)   LFT elevation   Hepatomegaly   Compression fracture of T9 vertebra (HCC)   Compression fracture of T10 vertebra (HCC)   Pneumonia   1-Acute Hypoxic respiratory Failure:  -Patient presented with shortness of breath, cough, CT angio show groundglass opacities suspicious for multifocal pneumonia. -Patient has completed 5 days of azithromycin 5/17.  Continue with IV ceftriaxone. -There was some concern about lymphangitic spread of the tumor by oncology. -Continue with schedule guaifenesin and nebulizer -BNP 400 range, echo normal Ef, pulmonary shunt,  severely elevated pulmonary pressure.  await pulmonologist evaluation.  -MRSA PCR negative.  -Plan to repeat IV lasix today.  -she received low dose IV steroid yesterday.  -Oxygen sat 64 % on 3 L.   2-Right side breast Ca, metastatic diseases to liver and spine.  -S/p lumpectomy.  She was on letrozole and Verzenio.   Currently on hold. She will complete radiation treatments today. -Dr. Georgiann Mohs following and appreciate assistance  3-Severe thrombocytopenia: -Monitor for bleeding. Could be in the setting of infection, side effect of chemotherapy, metastatic disease. -She received 2 units of platelets on 5/15.  Platelet increased to 50--- now down to 25 -She will need platelets transfusion if she requires any procedure Plan to proceed with one unit platelet transfusion today.   4-Hyperbilirubinemia , transaminases.  -Worsening LFT today, Bilirubin increase to 5.  -MRCP; negative for biliary or extra hepatic biliary  obstruction.  Extensive hepatic metastasis in the RIGHT hepatic lobe and smaller metastasis in the LEFT hepatic lobe. Overall volume of the liver is enlarged by metastasis and mildly increased from comparison PET-CT scan in February.  Dr Pamelia Hoit,  started Chemo 5/17 lower dose.  LFT relatively stable today.   5-Hyponatremia:  Improved with sodium tablet.  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  Hold sodium tablet, she will received IV lasix.   6-Macrocytic anemia, in the setting of malignancy.  She received 1 unit of packed red blood cell 5/15, transfusion had to be stopped because of concern of transfusion reaction with fever. Plan to proceed with one unit PRBC, she will be pre medicated with tylenol and benadryl. Discussed with Dr Pamelia Hoit.   7-Debility/deconditioning: She will need home health PT  8-goals of care: Patient with poor prognosis due to metastatic disease.  T10 vertebra compression fracture: Continue with supportive care pain management.  -Status post kyphoplasty for T9 compression fracture.   Nutrition Problem: Increased nutrient needs Etiology: cancer and cancer related treatments    Signs/Symptoms: estimated needs    Interventions: Ensure Enlive (each  supplement provides 350kcal and 20 grams of protein)  Estimated body mass index is 24.36 kg/m as calculated from the following:    Height as of this encounter: 5\' 5"  (1.651 m).   Weight as of this encounter: 66.4 kg.   DVT prophylaxis: SCD Code Status: Full code Family Communication: Family at bedside Disposition Plan:  Status is: Inpatient Remains inpatient appropriate because: management, resp failure    Consultants:  Pulmonologist Dr Pamelia Hoit  Procedures:    Antimicrobials:    Subjective: She is feeling better today, she ate breakfast, appetitive improved. Breathing better, she feels nebulizer helps a lot.    Objective: Vitals:   01/15/23 1933 01/15/23 2111 01/16/23 0447 01/16/23 1002  BP:  118/67 113/67   Pulse:  (!) 104 90   Resp:  18 18   Temp:  98.5 F (36.9 C) 98.2 F (36.8 C)   TempSrc:  Oral Oral   SpO2: 100% 95% 97% 95%  Weight:      Height:        Intake/Output Summary (Last 24 hours) at 01/16/2023 1006 Last data filed at 01/15/2023 2200 Gross per 24 hour  Intake 560 ml  Output 975 ml  Net -415 ml    Filed Weights   01/11/23 0951 01/11/23 1312  Weight: 63.5 kg 66.4 kg    Examination:  General exam: alert, chronic ill appearing.  Respiratory system: BL crackles.   Cardiovascular system: S 1, S 2 RRR Gastrointestinal system: BS present, soft, nt Central nervous system: Alert Extremities: no edema   Data Reviewed: I have personally reviewed following labs and imaging studies  CBC: Recent Labs  Lab 01/11/23 1011 01/11/23 1015 01/12/23 0552 01/13/23 0537 01/14/23 0102 01/15/23 0554 01/16/23 0815  WBC 4.9  --  4.6 4.8 4.9 7.3 7.7  NEUTROABS 2.7  --   --   --   --   --   --   HGB 10.1*   < > 8.8* 8.2* 7.8* 8.4* 7.8*  HCT 30.8*   < > 27.8* 25.5* 24.0* 25.6* 23.7*  MCV 101.3*  --  103.3* 103.7* 102.1* 101.6* 101.7*  PLT 40*  --  35* 29* 50* 25* 15*   < > = values in this interval not displayed.    Basic Metabolic Panel: Recent Labs  Lab 01/12/23 0552 01/13/23 0537 01/14/23 0102 01/15/23 0554 01/16/23 0815  NA 134* 133* 128* 132* 132*  K 4.0 4.0 3.6 4.2  4.2  CL 106 104 101 103 102  CO2 20* 21* 20* 20* 21*  GLUCOSE 100* 113* 125* 109* 143*  BUN 10 11 13 14 22   CREATININE 0.85 0.91 0.70 0.69 0.61  CALCIUM 8.1* 7.9* 7.9* 8.2* 7.9*    GFR: Estimated Creatinine Clearance: 62.2 mL/min (by C-G formula based on SCr of 0.61 mg/dL). Liver Function Tests: Recent Labs  Lab 01/12/23 0552 01/13/23 0537 01/14/23 0102 01/15/23 0554 01/16/23 0815  AST 189* 197* 199* 252* 296*  ALT 49* 51* 49* 56* 65*  ALKPHOS 277* 264* 286* 292* 281*  BILITOT 1.9* 2.1* 2.4* 5.6* 5.0*  PROT 6.1* 5.5* 5.5* 5.9* 5.6*  ALBUMIN 2.9* 2.6* 2.6* 2.7* 2.6*    Recent Labs  Lab 01/11/23 1011  LIPASE 85*    No results for input(s): "AMMONIA" in the last 168 hours. Coagulation Profile: Recent Labs  Lab 01/11/23 1114  INR 1.4*    Cardiac Enzymes: No results for input(s): "CKTOTAL", "CKMB", "CKMBINDEX", "TROPONINI" in the last 168 hours. BNP (last 3 results) No results for  input(s): "PROBNP" in the last 8760 hours. HbA1C: No results for input(s): "HGBA1C" in the last 72 hours. CBG: No results for input(s): "GLUCAP" in the last 168 hours. Lipid Profile: No results for input(s): "CHOL", "HDL", "LDLCALC", "TRIG", "CHOLHDL", "LDLDIRECT" in the last 72 hours. Thyroid Function Tests: No results for input(s): "TSH", "T4TOTAL", "FREET4", "T3FREE", "THYROIDAB" in the last 72 hours. Anemia Panel: No results for input(s): "VITAMINB12", "FOLATE", "FERRITIN", "TIBC", "IRON", "RETICCTPCT" in the last 72 hours.  Sepsis Labs: Recent Labs  Lab 01/13/23 2154 01/15/23 0554  PROCALCITON  --  0.63  LATICACIDVEN 1.9  --      Recent Results (from the past 240 hour(s))  SARS Coronavirus 2 by RT PCR (hospital order, performed in [facility] hospital lab) *cepheid single result test* Anterior Nasal Swab     Status: None   Collection Time: 01/11/23 10:11 AM   Specimen: Anterior Nasal Swab  Result Value Ref Range Status   SARS Coronavirus 2 by RT PCR NEGATIVE NEGATIVE  Final    Comment: (NOTE) SARS-CoV-2 target nucleic acids are NOT DETECTED.  The SARS-CoV-2 RNA is generally detectable in upper and lower respiratory specimens during the acute phase of infection. The lowest concentration of SARS-CoV-2 viral copies this assay can detect is 250 copies / mL. A negative result does not preclude SARS-CoV-2 infection and should not be used as the sole basis for treatment or other patient management decisions.  A negative result may occur with improper specimen collection / handling, submission of specimen other than nasopharyngeal swab, presence of viral mutation(s) within the areas targeted by this assay, and inadequate number of viral copies (<250 copies / mL). A negative result must be combined with clinical observations, patient history, and epidemiological information.  Fact Sheet for Patients:   RoadLapTop.co.za  Fact Sheet for Healthcare Providers: http://kim-miller.com/  This test is not yet approved or  cleared by the [place] FDA and has been authorized for detection and/or diagnosis of SARS-CoV-2 by FDA under an Emergency Use Authorization (EUA).  This EUA will remain in effect (meaning this test can be used) for the duration of the COVID-19 declaration under Section 564(b)(1) of the Act, 21 U.S.C. section 360bbb-3(b)(1), unless the authorization is terminated or revoked sooner.  Performed at [facility], [address] [place], [place] [address]   MRSA Next Gen by PCR, Nasal     Status: None   Collection Time: 01/15/23  3:46 PM   Specimen: Nasal Mucosa; Nasal Swab  Result Value Ref Range Status   MRSA by PCR Next Gen NOT DETECTED NOT DETECTED Final    Comment: (NOTE) The GeneXpert MRSA Assay (FDA approved for NASAL specimens only), is one component of a comprehensive MRSA colonization surveillance program. It is not intended to diagnose MRSA infection nor to  guide or monitor treatment for MRSA infections. Test performance is not FDA approved in patients less than 9 years old. Performed at [facility], [address], [place], [place] [address]          Radiology Studies: MR ABDOMEN MRCP W WO CONTAST  Result Date: 01/15/2023 CLINICAL DATA:  Breast cancer with hepatic metastasis. Elevated bilirubin. EXAM: MRI ABDOMEN WITHOUT AND WITH CONTRAST (INCLUDING MRCP) TECHNIQUE: Multiplanar multisequence MR imaging of the abdomen was performed both before and after the administration of intravenous contrast. Heavily T2-weighted images of the biliary and pancreatic ducts were obtained, and three-dimensional MRCP images were rendered by post processing. CONTRAST:  7mL GADAVIST GADOBUTROL 1 MMOL/ML IV  SOLN COMPARISON:  CT 01/12/2023, PET-CT 10/08/2022 FINDINGS: Lower chest: Small RIGHT pleural effusion is new from comparison CT. Hepatobiliary: Again demonstrated hypoenhancing mass occupying large portion of the RIGHT hepatic lobe involving segments 5,6,7 and 8. No change from recent CT. Broad lesion in the upper RIGHT hepatic lobe measures up to 12 cm. Smaller lesion in the posterior aspect of the lateral segment LEFT hepatic lobe measuring 2 cm (image 73/17) also unchanged. The liver has an overall enlarged volume presumably related to the metastasis. Liver volume volume is increased from comparison PET-CT scan within the LEFT lateral segment measuring 8 cm compared to 6.2 cm. New small volume of fluid along the RIGHT hepatic lobe (from 20/4/series 4) There is no intrahepatic biliary duct dilatation. The common hepatic duct and common bile duct are nondilated. Postcholecystectomy. MRCP images are degraded by respiratory imaging but no evidence of biliary duct dilatation on the other T2 weighted sequences (series 4 and series 3 for example). Pancreas: Normal pancreatic parenchymal intensity. No ductal dilatation or inflammation. Spleen: Normal  spleen. Adrenals/urinary tract: Adrenal glands and kidneys are normal. Stomach/Bowel: Stomach and limited of the small bowel is unremarkable Vascular/Lymphatic: Abdominal aortic normal caliber. No retroperitoneal periportal lymphadenopathy. Musculoskeletal: Lesions at L4 and in the sacrum again noted on coronal T2 weighted imaging. (Series 3) no clear post-contrast enhancement IMPRESSION: 1. Extensive hepatic metastasis in the RIGHT hepatic lobe and smaller metastasis in the LEFT hepatic lobe. Overall volume of the liver is enlarged by metastasis and mildly increased from comparison PET-CT scan in February. 2. No evidence of biliary duct dilatation. No intrahepatic or extrahepatic biliary duct dilatation. Postcholecystectomy. 3. Pancreas appears normal. 4. New small RIGHT pleural effusion and small volume fluid along the RIGHT hepatic lobe. Electronically Signed   By: Genevive Bi M.D.   On: 01/15/2023 12:41   MR 3D Recon At Scanner  Result Date: 01/15/2023 CLINICAL DATA:  Breast cancer with hepatic metastasis. Elevated bilirubin. EXAM: MRI ABDOMEN WITHOUT AND WITH CONTRAST (INCLUDING MRCP) TECHNIQUE: Multiplanar multisequence MR imaging of the abdomen was performed both before and after the administration of intravenous contrast. Heavily T2-weighted images of the biliary and pancreatic ducts were obtained, and three-dimensional MRCP images were rendered by post processing. CONTRAST:  7mL GADAVIST GADOBUTROL 1 MMOL/ML IV SOLN COMPARISON:  CT 01/12/2023, PET-CT 10/08/2022 FINDINGS: Lower chest: Small RIGHT pleural effusion is new from comparison CT. Hepatobiliary: Again demonstrated hypoenhancing mass occupying large portion of the RIGHT hepatic lobe involving segments 5,6,7 and 8. No change from recent CT. Broad lesion in the upper RIGHT hepatic lobe measures up to 12 cm. Smaller lesion in the posterior aspect of the lateral segment LEFT hepatic lobe measuring 2 cm (image 73/17) also unchanged. The liver has  an overall enlarged volume presumably related to the metastasis. Liver volume volume is increased from comparison PET-CT scan within the LEFT lateral segment measuring 8 cm compared to 6.2 cm. New small volume of fluid along the RIGHT hepatic lobe (from 20/4/series 4) There is no intrahepatic biliary duct dilatation. The common hepatic duct and common bile duct are nondilated. Postcholecystectomy. MRCP images are degraded by respiratory imaging but no evidence of biliary duct dilatation on the other T2 weighted sequences (series 4 and series 3 for example). Pancreas: Normal pancreatic parenchymal intensity. No ductal dilatation or inflammation. Spleen: Normal spleen. Adrenals/urinary tract: Adrenal glands and kidneys are normal. Stomach/Bowel: Stomach and limited of the small bowel is unremarkable Vascular/Lymphatic: Abdominal aortic normal caliber. No retroperitoneal periportal lymphadenopathy. Musculoskeletal: Lesions at L4  and in the sacrum again noted on coronal T2 weighted imaging. (Series 3) no clear post-contrast enhancement IMPRESSION: 1. Extensive hepatic metastasis in the RIGHT hepatic lobe and smaller metastasis in the LEFT hepatic lobe. Overall volume of the liver is enlarged by metastasis and mildly increased from comparison PET-CT scan in February. 2. No evidence of biliary duct dilatation. No intrahepatic or extrahepatic biliary duct dilatation. Postcholecystectomy. 3. Pancreas appears normal. 4. New small RIGHT pleural effusion and small volume fluid along the RIGHT hepatic lobe. Electronically Signed   By: Genevive Bi M.D.   On: 01/15/2023 12:41   DG Chest 2 View  Result Date: 01/15/2023 CLINICAL DATA:  [id number] Hypoxia [id number] EXAM: CHEST - 2 VIEW COMPARISON:  01/11/2023 FINDINGS: Mild interstitial edema or infiltrates involving bases more than apices slightly more conspicuous than on prior exam. Heart size and mediastinal contours are within normal limits. No effusion. Surgical clips right  breast. Post midthoracic segment vertebral augmentation. Right upper quadrant surgical clips. IMPRESSION: Mild interstitial edema or infiltrates. Electronically Signed   By: Corlis Leak M.D.   On: 01/15/2023 10:58        Scheduled Meds:  sodium chloride   Intravenous Once   sodium chloride   Intravenous Once   acetaminophen  650 mg Oral Once   diphenhydrAMINE  25 mg Oral Once   famotidine  10 mg Oral BID   feeding supplement  1 Container Oral TID BM   furosemide  20 mg Intravenous Once   furosemide  20 mg Intravenous Once   furosemide  20 mg Intravenous Once   gabapentin  300 mg Oral QHS   guaiFENesin  1,200 mg Oral BID   ipratropium  0.5 mg Nebulization TID   levalbuterol  0.63 mg Nebulization TID   potassium chloride  20 mEq Oral Once   venlafaxine XR  37.5 mg Oral Daily   Continuous Infusions:  cefTRIAXone (ROCEPHIN)  IV Stopped (01/15/23 1045)     LOS: 4 days    Time spent: 35 minutes    Argelio Granier A Asia Favata, MD [facility]   If 7PM-7AM, please contact night-coverage [email or web address]  01/16/2023, 10:06 AM

## 2023-01-17 DIAGNOSIS — J9601 Acute respiratory failure with hypoxia: Secondary | ICD-10-CM | POA: Diagnosis not present

## 2023-01-17 LAB — CBC
HCT: 28.5 % — ABNORMAL LOW (ref 36.0–46.0)
Hemoglobin: 9.2 g/dL — ABNORMAL LOW (ref 12.0–15.0)
MCH: 31.8 pg (ref 26.0–34.0)
MCHC: 32.3 g/dL (ref 30.0–36.0)
MCV: 98.6 fL (ref 80.0–100.0)
Platelets: 24 10*3/uL — CL (ref 150–400)
RBC: 2.89 MIL/uL — ABNORMAL LOW (ref 3.87–5.11)
RDW: 23.6 % — ABNORMAL HIGH (ref 11.5–15.5)
WBC: 8.4 10*3/uL (ref 4.0–10.5)
nRBC: 4.9 % — ABNORMAL HIGH (ref 0.0–0.2)

## 2023-01-17 LAB — COMPREHENSIVE METABOLIC PANEL
ALT: 72 U/L — ABNORMAL HIGH (ref 0–44)
AST: 309 U/L — ABNORMAL HIGH (ref 15–41)
Albumin: 2.6 g/dL — ABNORMAL LOW (ref 3.5–5.0)
Alkaline Phosphatase: 265 U/L — ABNORMAL HIGH (ref 38–126)
Anion gap: 11 (ref 5–15)
BUN: 30 mg/dL — ABNORMAL HIGH (ref 8–23)
CO2: 20 mmol/L — ABNORMAL LOW (ref 22–32)
Calcium: 7.7 mg/dL — ABNORMAL LOW (ref 8.9–10.3)
Chloride: 101 mmol/L (ref 98–111)
Creatinine, Ser: 0.88 mg/dL (ref 0.44–1.00)
GFR, Estimated: >60 mL/min (ref >60)
Glucose, Bld: 131 mg/dL — ABNORMAL HIGH (ref 70–99)
Potassium: 4.4 mmol/L (ref 3.5–5.1)
Sodium: 132 mmol/L — ABNORMAL LOW (ref 135–145)
Total Bilirubin: 4.2 mg/dL — ABNORMAL HIGH (ref 0.3–1.2)
Total Protein: 5.6 g/dL — ABNORMAL LOW (ref 6.5–8.1)

## 2023-01-17 LAB — PREPARE PLATELET PHERESIS

## 2023-01-17 LAB — BPAM PLATELET PHERESIS: Blood Product Expiration Date: 202405192359

## 2023-01-17 MED ORDER — FUROSEMIDE 10 MG/ML IJ SOLN
20.0000 mg | Freq: Once | INTRAMUSCULAR | Status: AC
  Administered 2023-01-17: 20 mg via INTRAVENOUS
  Filled 2023-01-17: qty 2

## 2023-01-17 MED ORDER — POTASSIUM CHLORIDE CRYS ER 20 MEQ PO TBCR
20.0000 meq | EXTENDED_RELEASE_TABLET | Freq: Once | ORAL | Status: AC
  Administered 2023-01-17: 20 meq via ORAL
  Filled 2023-01-17: qty 1

## 2023-01-17 MED ORDER — ALUM & MAG HYDROXIDE-SIMETH 200-200-20 MG/5ML PO SUSP
30.0000 mL | ORAL | Status: DC | PRN
  Administered 2023-01-17 – 2023-01-20 (×9): 30 mL via ORAL
  Filled 2023-01-17 (×9): qty 30

## 2023-01-17 NOTE — Progress Notes (Signed)
Physical Therapy Treatment Patient Details Name: Theresa [name] MRN: [id number] DOB: May 29, 1956 Today's Date: 01/17/2023   History of Present Illness 67 yo female admitted to hospital on 01/11/2023 due to SOB, persistent cough and worsening back pain. Pt was found to be hypoxic secondary to atypical PNA and is now requiring supplemental O2 as well as worsening LFTs. Imaging revealed T9 and T10 compression fx. Pt fitted for TLSO. Pt has significant PMH for metastatic breast ca to bone and liver, s/p chemotherapy and radiation, HTN and vertigo.    PT Comments    Pt received in bed, appeared slightly "flushed" however temp 97.7. Pt on 2L at rest with SpO2 97% at rest after breathing treatment. Pt transferred to EOB with HOB raised and use of side rail, MinA to stand from bed to RW, pt slightly more weak then a few days ago. Began gait training in hall with CGA, RW, on 2L. Pt'd SpO2 dropped to 80% after ~15', O2 gradually increased, requiring 4L O2 to maintain 87% for ~355ft. Pt however did not appear to be SOB, vc's for PLB technique. Pt returned to room, once sitting, pt required 2 minutes rest before returning to 97% on 2L O2. Pt will benefit from continued PT acutely per POC until cleared medically for d/c home with assist from family.   Recommendations for follow up therapy are one component of a multi-disciplinary discharge planning process, led by the attending physician.  Recommendations may be updated based on patient status, additional functional criteria and insurance authorization.  Follow Up Recommendations       Assistance Recommended at Discharge Intermittent Supervision/Assistance  Patient can return home with the following A little help with walking and/or transfers;A little help with bathing/dressing/bathroom;Assistance with cooking/housework;Assist for transportation;Help with stairs or ramp for entrance   Equipment Recommendations  Rollator (4 wheels);Wheelchair (measurements  PT);Wheelchair cushion (measurements PT)    Recommendations for Other Services       Precautions / Restrictions Precautions Precautions: Fall Precaution Comments: monitor O2 Required Braces or Orthoses: Spinal Brace Spinal Brace: Thoracolumbosacral orthotic (For comfort) Restrictions Weight Bearing Restrictions: No Other Position/Activity Restrictions: Log Roll     Mobility  Bed Mobility Overal bed mobility: Modified Independent Bed Mobility: Sidelying to Sit, Supine to Sit   Sidelying to sit: Modified independent (Device/Increase time), HOB elevated (Use of side rail) Supine to sit: Min guard     General bed mobility comments: min cues for LOG rolling and HOB slightly elevated    Transfers Overall transfer level: Needs assistance Equipment used: Rolling walker (2 wheels) Transfers: Sit to/from Stand Sit to Stand: Min guard, Min assist           General transfer comment: vc's for hand placement    Ambulation/Gait Ambulation/Gait assistance: Min guard Gait Distance (Feet): 300 Feet Assistive device: Rolling walker (2 wheels) Gait Pattern/deviations: Step-through pattern Gait velocity: decreased     General Gait Details:  (Initially on 2L O2 at 97%, increased to 4L to complete gait distance at 87%, no significant SOB noted)   Stairs             Wheelchair Mobility    Modified Rankin (Stroke Patients Only)       Balance Overall balance assessment: Needs assistance Sitting-balance support: No upper extremity supported, Feet supported Sitting balance-Leahy Scale: Good     Standing balance support: Bilateral upper extremity supported, During functional activity Standing balance-Leahy Scale: Fair Standing balance comment: RW due to overall weakness throughout  Cognition Arousal/Alertness: Awake/alert Behavior During Therapy: WFL for tasks assessed/performed Overall Cognitive Status: Within Functional Limits  for tasks assessed                                 General Comments: Friends and husbandpresent        Exercises      General Comments General comments (skin integrity, edema, etc.): Pt and husband educated on possible DME options for safe transition home. Questions/concerns addressed.      Pertinent Vitals/Pain Pain Assessment Pain Assessment: No/denies pain    Home Living                          Prior Function            PT Goals (current goals can now be found in the care plan section) Acute Rehab PT Goals Patient Stated Goal: to go home    Frequency    Min 1X/week      PT Plan Current plan remains appropriate    Co-evaluation              AM-PAC PT "6 Clicks" Mobility   Outcome Measure  Help needed turning from your back to your side while in a flat bed without using bedrails?: A Little Help needed moving from lying on your back to sitting on the side of a flat bed without using bedrails?: A Little Help needed moving to and from a bed to a chair (including a wheelchair)?: A Little Help needed standing up from a chair using your arms (e.g., wheelchair or bedside chair)?: A Little Help needed to walk in hospital room?: A Little Help needed climbing 3-5 steps with a railing? : A Little 6 Click Score: 18    End of Session Equipment Utilized During Treatment: Gait belt Activity Tolerance: No increased pain;Patient limited by lethargy (Increased O2 to 4L for gait training) Patient left: in chair;with call bell/phone within reach;with family/visitor present Nurse Communication: Mobility status (O2 needs) PT Visit Diagnosis: Unsteadiness on feet (R26.81);Other abnormalities of gait and mobility (R26.89);Muscle weakness (generalized) (M62.81)     Time: 1511-1530 PT Time Calculation (min) (ACUTE ONLY): 19 min  Charges:  $Gait Training: 8-22 mins                    Zadie Cleverly, PTA  Jannet Askew 01/17/2023, 3:37 PM

## 2023-01-17 NOTE — Progress Notes (Cosign Needed Addendum)
Referring Physician(s): Serena Croissant  Supervising Physician: Mugweru,J  Patient Status:  [facility] - In-pt  Chief Complaint:  Metastatic breast cancer  Subjective: Patient known to IR service from right liver lesion biopsy on 10/06/2022, T9 vertebral body biopsy, ablation and cement augmentation using KP on 12/10/2022.  She is a 67 year old female with past medical history significant for hypertension, fibroids, ovarian cyst, vertigo who presents now with progressive metastatic breast cancer/anemia/thrombocytopenia, elevated LFT's/jaundice, recently treated multifocal pneumonia.  Request now received from oncology for port a cath placement to assist with treatment. She currently denies fever,HA,CP, abd pain,N/V or bleeding. She does have some dyspnea, occ cough, occ back pain.   Past Medical History:  Diagnosis Date   Breast cancer, right (HCC)    Contact lens/glasses fitting    wears contacts or glasses   Diarrhea    --post gallbladder surgery   Family history of bone cancer    Family history of brain cancer    Family history of stomach cancer    Family history of uterine cancer    Fibroids    History of radiation therapy    Hypertension    No pertinent past medical history    Personal history of radiation therapy    PONV (postoperative nausea and vomiting)    Right ovarian cyst    Vertigo    Past Surgical History:  Procedure Laterality Date   BLADDER SUSPENSION     BREAST BIOPSY  09/08/2012   Procedure: BREAST BIOPSY;  Surgeon: Adolph Pollack, MD;  Location: [facility];  Service: General;  Laterality: Left;  remove left breast mass   BREAST LUMPECTOMY Right 10/2019   BREAST LUMPECTOMY WITH RADIOACTIVE SEED AND SENTINEL LYMPH NODE BIOPSY Right 10/04/2019   Procedure: RIGHT BREAST LUMPECTOMY WITH BRACKETED RADIOACTIVE SEEDS, RIGHT BREAST RADIOACTIVE SEED GUIDED EXCISION BIOPSY, AND RIGHT SENTINEL LYMPH NODE BIOPSY;  Surgeon: Almond Lint, MD;  Location: [facility];  Service: General;  Laterality: Right;   BREAST SURGERY     lumpectomy x2   BUNIONECTOMY Right 01/2016   CHOLECYSTECTOMY     COLONOSCOPY     CYSTOSCOPY N/A 01/16/2020   Procedure: CYSTOSCOPY;  Surgeon: Patton Salles, MD;  Location: [facility];  Service: Gynecology;  Laterality: N/A;   IR BONE TUMOR(S)RF ABLATION  12/10/2022   IR KYPHO THORACIC WITH BONE BIOPSY  12/10/2022   IR RADIOLOGIST EVAL & MGMT  12/24/2022   MASTOPEXY Right 10/04/2019   Procedure: RIGHT BREAST MASTOPEXY;  Surgeon: Almond Lint, MD;  Location: [facility];  Service: General;  Laterality: Right;   RE-EXCISION OF BREAST LUMPECTOMY Right 10/31/2019   Procedure: RIGHT RE-EXCISION OF BREAST LUMPECTOMY;  Surgeon: Almond Lint, MD;  Location:  S[facility];  Service: General;  Laterality: Right;   TOTAL LAPAROSCOPIC HYSTERECTOMY WITH BILATERAL SALPINGO OOPHORECTOMY N/A 01/16/2020   Procedure: TOTAL LAPAROSCOPIC HYSTERECTOMY WITH BILATERAL SALPINGO OOPHORECTOMY/COLLECTION OF PELVIC WASHINGS, LYSIS OF ADHESIONS;  Surgeon: Patton Salles, MD;  Location: [facility];  Service: Gynecology;  Laterality: N/A;  collection of pelvic washings   TUBAL LIGATION        Allergies: Metronidazole, Other, and Tape  Medications: Prior to Admission medications   Medication Sig Start Date End Date Taking? Authorizing Provider  abemaciclib (VERZENIO) 100 MG tablet Take 1 tablet (100 mg total) by mouth 2 (two) times daily. 12/09/22  Yes Serena Croissant, MD  famotidine (PEPCID) 10 MG tablet Take 10 mg  by mouth 2 (two) times daily.   Yes [provider]  gabapentin (NEURONTIN) 300 MG capsule Take 1 capsule (300 mg total) by mouth at bedtime. 06/30/22  Yes Serena Croissant, MD  guaiFENesin (MUCINEX) 600 MG 12 hr tablet Take 600 mg by mouth 2 (two) times daily.   Yes [provider]  letrozole (FEMARA) 2.5 MG tablet TAKE 1 TABLET BY MOUTH EVERY  DAY 10/23/22  Yes Serena Croissant, MD  LORazepam (ATIVAN) 0.5 MG tablet TAKE 1 TABLET BY MOUTH EVERY 8 HOURS AS NEEDED FOR ANXIETY. Patient taking differently: Take 0.5 mg by mouth every 8 (eight) hours as needed for anxiety. for anxiety 12/15/22  Yes Serena Croissant, MD  ondansetron (ZOFRAN) 8 MG tablet Take 1 tablet (8 mg total) by mouth every 8 (eight) hours as needed for nausea or vomiting. 11/03/22  Yes Serena Croissant, MD  oxyCODONE-acetaminophen (PERCOCET/ROXICET) 5-325 MG tablet Take 1 tablet by mouth every 6 (six) hours as needed for severe pain. 12/16/22  Yes Serena Croissant, MD  valsartan (DIOVAN) 40 MG tablet Take 40 mg by mouth daily. 05/06/21  Yes [provider]  venlafaxine XR (EFFEXOR-XR) 37.5 MG 24 hr capsule Take 1 capsule (37.5 mg total) by mouth daily. 02/19/22  Yes Iruku, Burnice Logan, MD  dexamethasone (DECADRON) 4 MG tablet Take 1 tablet (4 mg total) by mouth daily. Take 1 tablet day chemotherapy 1 tablet 2 days after chemo with food 01/13/23   Serena Croissant, MD  hydrocortisone 2.5 % cream Apply topically 3 (three) times daily as needed. Patient not taking: Reported on 11/03/2022 12/11/19   Lonie Peak, MD  loperamide (IMODIUM) 2 MG capsule Take 2 mg by mouth in the morning and at bedtime.  Patient not taking: Reported on 01/11/2023    [provider]  ondansetron (ZOFRAN) 8 MG tablet Take 1 tablet (8 mg total) by mouth every 8 (eight) hours as needed for nausea or vomiting. Start on the third day after chemotherapy. 01/13/23   Serena Croissant, MD  prochlorperazine (COMPAZINE) 10 MG tablet Take 1 tablet (10 mg total) by mouth every 6 (six) hours as needed for nausea or vomiting. Patient not taking: Reported on 01/11/2023 11/03/22   Serena Croissant, MD  prochlorperazine (COMPAZINE) 10 MG tablet Take 1 tablet (10 mg total) by mouth every 6 (six) hours as needed for nausea or vomiting. 01/13/23   Serena Croissant, MD  triamcinolone (KENALOG) 0.025 % ointment Apply 1 Application topically 2 (two)  times daily. Use for one week at a time. Patient not taking: Reported on 11/03/2022 07/08/22   Patton Salles, MD     Vital Signs: BP 114/69 (BP Location: Right Arm)   Pulse 90   Temp 98.1 F (36.7 C) (Oral)   Resp 18   Ht 5\' 5"  (1.651 m)   Wt 146 lb 6.2 oz (66.4 kg)   LMP 09/01/2007 (Exact Date)   SpO2 97%   BMI 24.36 kg/m   Physical Exam: awake/alert; jaundiced; chest- few bibasilar crackles, sl dim BS rt base; heart- RRR; abd- soft,+BS,NT; no LE edema  Imaging: ECHOCARDIOGRAM COMPLETE BUBBLE STUDY  Result Date: 01/16/2023    ECHOCARDIOGRAM REPORT   Patient Name:   KARTIER MCQUIRE Date of Exam: 01/16/2023 Medical Rec #:  [id number]     Height:       65.0 in Accession #:    [id number]    Weight:       146.4 lb Date of Birth:  Sep 04, 1955  BSA:          1.732 m Patient Age:    67 years      BP:           113/67 mmHg Patient Gender: F             HR:           104 bpm. Exam Location:  Inpatient Procedure: 2D Echo, Cardiac Doppler and Color Doppler Indications:    Hypoxia  History:        Patient has prior history of Echocardiogram examinations. Risk                 Factors:Hypertension.  Sonographer:    Mike Gip Referring Phys: [phone] BELKYS A REGALADO IMPRESSIONS  1. 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 Moderate RV dilation with normal function. Severely elevated pulmonary pressures.  2. Right ventricular systolic function is normal. The right ventricular size is moderately enlarged. There is severely elevated pulmonary artery systolic pressure. The estimated right ventricular systolic pressure is 72.2 mmHg.  3. Left ventricular ejection fraction, by estimation, is 55 to 60%. The left ventricle has normal function. The left ventricle has no regional wall motion abnormalities. Indeterminate diastolic filling due to E-A fusion. There is the interventricular septum is flattened in systole, consistent with right ventricular pressure overload and the interventricular septum  is flattened in systole and diastole, consistent with right ventricular pressure and volume overload.  4. The mitral valve is grossly normal. Trivial mitral valve regurgitation. No evidence of mitral stenosis.  5. Tricuspid valve regurgitation is mild to moderate.  6. The aortic valve is tricuspid. Aortic valve regurgitation is not visualized. No aortic stenosis is present.  7. The inferior vena cava is dilated in size with <50% respiratory variability, suggesting right atrial pressure of 15 mmHg.  8. Agitated saline contrast bubble study was positive with shunting observed after >6 cardiac cycles suggestive of intrapulmonary shunting. FINDINGS  Left Ventricle: Left ventricular ejection fraction, by estimation, is 55 to 60%. The left ventricle has normal function. The left ventricle has no regional wall motion abnormalities. The left ventricular internal cavity size was normal in size. There is  no left ventricular hypertrophy. The interventricular septum is flattened in systole, consistent with right ventricular pressure overload and the interventricular septum is flattened in systole and diastole, consistent with right ventricular pressure and volume overload. Indeterminate diastolic filling due to E-A fusion. Right Ventricle: The right ventricular size is moderately enlarged. No increase in right ventricular wall thickness. Right ventricular systolic function is normal. There is severely elevated pulmonary artery systolic pressure. The tricuspid regurgitant velocity is 3.78 m/s, and with an assumed right atrial pressure of 15 mmHg, the estimated right ventricular systolic pressure is 72.2 mmHg. Left Atrium: Left atrial size was normal in size. Right Atrium: Right atrial size was normal in size. Pericardium: There is no evidence of pericardial effusion. Mitral Valve: The mitral valve is grossly normal. Trivial mitral valve regurgitation. No evidence of mitral valve stenosis. Tricuspid Valve: The tricuspid valve is  grossly normal. Tricuspid valve regurgitation is mild to moderate. No evidence of tricuspid stenosis. Aortic Valve: The aortic valve is tricuspid. Aortic valve regurgitation is not visualized. No aortic stenosis is present. Pulmonic Valve: The pulmonic valve was grossly normal. Pulmonic valve regurgitation is trivial. No evidence of pulmonic stenosis. Aorta: The aortic root and ascending aorta are structurally normal, with no evidence of dilitation. Venous: The inferior vena cava is dilated in size with  less than 50% respiratory variability, suggesting right atrial pressure of 15 mmHg. IAS/Shunts: The atrial septum is grossly normal. Agitated saline contrast was given intravenously to evaluate for intracardiac shunting. Agitated saline contrast bubble study was positive with shunting observed after >6 cardiac cycles suggestive of intrapulmonary shunting.  LEFT VENTRICLE PLAX 2D LVIDd:         3.70 cm     Diastology LVIDs:         2.60 cm     LV e' medial:    7.18 cm/s LV PW:         1.00 cm     LV E/e' medial:  11.0 LV IVS:        1.00 cm     LV e' lateral:   9.03 cm/s LVOT diam:     2.00 cm     LV E/e' lateral: 8.8 LV SV:         58 LV SV Index:   33 LVOT Area:     3.14 cm  LV Volumes (MOD) LV vol d, MOD A2C: 93.7 ml LV vol d, MOD A4C: 69.2 ml LV vol s, MOD A2C: 39.4 ml LV vol s, MOD A4C: 30.2 ml LV SV MOD A2C:     54.3 ml LV SV MOD A4C:     69.2 ml LV SV MOD BP:      49.8 ml RIGHT VENTRICLE             IVC RV Basal diam:  4.80 cm     IVC diam: 2.40 cm RV S prime:     14.40 cm/s TAPSE (M-mode): 2.2 cm LEFT ATRIUM             Index        RIGHT ATRIUM           Index LA diam:        2.90 cm 1.67 cm/m   RA Area:     20.20 cm LA Vol (A2C):   55.6 ml 32.09 ml/m  RA Volume:   59.50 ml  34.34 ml/m LA Vol (A4C):   35.2 ml 20.32 ml/m LA Biplane Vol: 46.2 ml 26.67 ml/m  AORTIC VALVE LVOT Vmax:   97.90 cm/s LVOT Vmean:  62.700 cm/s LVOT VTI:    0.184 m  AORTA Ao Root diam: 3.20 cm Ao Asc diam:  3.30 cm MITRAL VALVE                TRICUSPID VALVE MV Area (PHT): 5.16 cm    TR Peak grad:   57.2 mmHg MV Decel Time: 147 msec    TR Vmax:        378.00 cm/s MV E velocity: 79.20 cm/s MV A velocity: 98.80 cm/s  SHUNTS MV E/A ratio:  0.80        Systemic VTI:  0.18 m                            Systemic Diam: 2.00 cm Lennie Odor MD Electronically signed by Lennie Odor MD Signature Date/Time: 01/16/2023/12:23:45 PM    Final    MR ABDOMEN MRCP W WO CONTAST  Result Date: 01/15/2023 CLINICAL DATA:  Breast cancer with hepatic metastasis. Elevated bilirubin. EXAM: MRI ABDOMEN WITHOUT AND WITH CONTRAST (INCLUDING MRCP) TECHNIQUE: Multiplanar multisequence MR imaging of the abdomen was performed both before and after the administration of intravenous contrast. Heavily T2-weighted images of the biliary and pancreatic ducts were obtained,  and three-dimensional MRCP images were rendered by post processing. CONTRAST:  7mL GADAVIST GADOBUTROL 1 MMOL/ML IV SOLN COMPARISON:  CT 01/12/2023, PET-CT 10/08/2022 FINDINGS: Lower chest: Small RIGHT pleural effusion is new from comparison CT. Hepatobiliary: Again demonstrated hypoenhancing mass occupying large portion of the RIGHT hepatic lobe involving segments 5,6,7 and 8. No change from recent CT. Broad lesion in the upper RIGHT hepatic lobe measures up to 12 cm. Smaller lesion in the posterior aspect of the lateral segment LEFT hepatic lobe measuring 2 cm (image 73/17) also unchanged. The liver has an overall enlarged volume presumably related to the metastasis. Liver volume volume is increased from comparison PET-CT scan within the LEFT lateral segment measuring 8 cm compared to 6.2 cm. New small volume of fluid along the RIGHT hepatic lobe (from 20/4/series 4) There is no intrahepatic biliary duct dilatation. The common hepatic duct and common bile duct are nondilated. Postcholecystectomy. MRCP images are degraded by respiratory imaging but no evidence of biliary duct dilatation on the other T2  weighted sequences (series 4 and series 3 for example). Pancreas: Normal pancreatic parenchymal intensity. No ductal dilatation or inflammation. Spleen: Normal spleen. Adrenals/urinary tract: Adrenal glands and kidneys are normal. Stomach/Bowel: Stomach and limited of the small bowel is unremarkable Vascular/Lymphatic: Abdominal aortic normal caliber. No retroperitoneal periportal lymphadenopathy. Musculoskeletal: Lesions at L4 and in the sacrum again noted on coronal T2 weighted imaging. (Series 3) no clear post-contrast enhancement IMPRESSION: 1. Extensive hepatic metastasis in the RIGHT hepatic lobe and smaller metastasis in the LEFT hepatic lobe. Overall volume of the liver is enlarged by metastasis and mildly increased from comparison PET-CT scan in February. 2. No evidence of biliary duct dilatation. No intrahepatic or extrahepatic biliary duct dilatation. Postcholecystectomy. 3. Pancreas appears normal. 4. New small RIGHT pleural effusion and small volume fluid along the RIGHT hepatic lobe. Electronically Signed   By: Genevive Bi M.D.   On: 01/15/2023 12:41   MR 3D Recon At Scanner  Result Date: 01/15/2023 CLINICAL DATA:  Breast cancer with hepatic metastasis. Elevated bilirubin. EXAM: MRI ABDOMEN WITHOUT AND WITH CONTRAST (INCLUDING MRCP) TECHNIQUE: Multiplanar multisequence MR imaging of the abdomen was performed both before and after the administration of intravenous contrast. Heavily T2-weighted images of the biliary and pancreatic ducts were obtained, and three-dimensional MRCP images were rendered by post processing. CONTRAST:  7mL GADAVIST GADOBUTROL 1 MMOL/ML IV SOLN COMPARISON:  CT 01/12/2023, PET-CT 10/08/2022 FINDINGS: Lower chest: Small RIGHT pleural effusion is new from comparison CT. Hepatobiliary: Again demonstrated hypoenhancing mass occupying large portion of the RIGHT hepatic lobe involving segments 5,6,7 and 8. No change from recent CT. Broad lesion in the upper RIGHT hepatic lobe  measures up to 12 cm. Smaller lesion in the posterior aspect of the lateral segment LEFT hepatic lobe measuring 2 cm (image 73/17) also unchanged. The liver has an overall enlarged volume presumably related to the metastasis. Liver volume volume is increased from comparison PET-CT scan within the LEFT lateral segment measuring 8 cm compared to 6.2 cm. New small volume of fluid along the RIGHT hepatic lobe (from 20/4/series 4) There is no intrahepatic biliary duct dilatation. The common hepatic duct and common bile duct are nondilated. Postcholecystectomy. MRCP images are degraded by respiratory imaging but no evidence of biliary duct dilatation on the other T2 weighted sequences (series 4 and series 3 for example). Pancreas: Normal pancreatic parenchymal intensity. No ductal dilatation or inflammation. Spleen: Normal spleen. Adrenals/urinary tract: Adrenal glands and kidneys are normal. Stomach/Bowel: Stomach and limited of the  small bowel is unremarkable Vascular/Lymphatic: Abdominal aortic normal caliber. No retroperitoneal periportal lymphadenopathy. Musculoskeletal: Lesions at L4 and in the sacrum again noted on coronal T2 weighted imaging. (Series 3) no clear post-contrast enhancement IMPRESSION: 1. Extensive hepatic metastasis in the RIGHT hepatic lobe and smaller metastasis in the LEFT hepatic lobe. Overall volume of the liver is enlarged by metastasis and mildly increased from comparison PET-CT scan in February. 2. No evidence of biliary duct dilatation. No intrahepatic or extrahepatic biliary duct dilatation. Postcholecystectomy. 3. Pancreas appears normal. 4. New small RIGHT pleural effusion and small volume fluid along the RIGHT hepatic lobe. Electronically Signed   By: Genevive Bi M.D.   On: 01/15/2023 12:41   DG Chest 2 View  Result Date: 01/15/2023 CLINICAL DATA:  [id number] Hypoxia [id number] EXAM: CHEST - 2 VIEW COMPARISON:  01/11/2023 FINDINGS: Mild interstitial edema or infiltrates involving bases  more than apices slightly more conspicuous than on prior exam. Heart size and mediastinal contours are within normal limits. No effusion. Surgical clips right breast. Post midthoracic segment vertebral augmentation. Right upper quadrant surgical clips. IMPRESSION: Mild interstitial edema or infiltrates. Electronically Signed   By: Corlis Leak M.D.   On: 01/15/2023 10:58    Labs:  CBC: Recent Labs    01/14/23 0102 01/15/23 0554 01/16/23 0815 01/17/23 0540  WBC 4.9 7.3 7.7 8.4  HGB 7.8* 8.4* 7.8* 9.2*  HCT 24.0* 25.6* 23.7* 28.5*  PLT 50* 25* 15* 24*    COAGS: Recent Labs    09/28/22 1453 10/01/22 0754 10/06/22 0735 10/07/22 1354 10/13/22 0814 11/26/22 1034 01/11/23 1114  INR 1.9* 1.9*   < > 1.7* 1.6* 1.4* 1.4*  APTT 37* 36  --  35  --   --   --    < > = values in this interval not displayed.    BMP: Recent Labs    01/14/23 0102 01/15/23 0554 01/16/23 0815 01/17/23 0540  NA 128* 132* 132* 132*  K 3.6 4.2 4.2 4.4  CL 101 103 102 101  CO2 20* 20* 21* 20*  GLUCOSE 125* 109* 143* 131*  BUN 13 14 22  30*  CALCIUM 7.9* 8.2* 7.9* 7.7*  CREATININE 0.70 0.69 0.61 0.88  GFRNONAA >60 >60 >60 >60    LIVER FUNCTION TESTS: Recent Labs    01/14/23 0102 01/15/23 0554 01/16/23 0815 01/17/23 0540  BILITOT 2.4* 5.6* 5.0* 4.2*  AST 199* 252* 296* 309*  ALT 49* 56* 65* 72*  ALKPHOS 286* 292* 281* 265*  PROT 5.5* 5.9* 5.6* 5.6*  ALBUMIN 2.6* 2.7* 2.6* 2.6*    Assessment and Plan: 67 year old female with past medical history significant for hypertension, fibroids, ovarian cyst, vertigo who presents now with progressive metastatic breast cancer/anemia/thrombocytopenia, elevated LFT's/jaundice, recently treated multifocal pneumonia- remains on IV Rocephin. .  Request now received from oncology for port a cath placement to assist with treatment. Risks and benefits of image guided port-a-catheter placement was discussed with the patient/spouse  including, but not limited to  bleeding, infection, pneumothorax, or fibrin sheath development and need for additional procedures.  All of the patient's questions were answered, patient is agreeable to proceed. Consent signed and in chart. Procedure tent scheduled for 5/20.   WBC nl, hgb 9.2, Plts today 24k- will likely need transfusion preprocedure; recheck in am   Electronically Signed: D. Jeananne Rama, PA-C 01/17/2023, 8:36 AM   I spent a total of 25 Minutes at the the patient's bedside AND on the patient's hospital floor or unit, greater than 50% of  which was counseling/coordinating care for port a cath placement    Patient ID: SARAHJEAN HUNTE, female   DOB: 04-25-1956, 67 y.o.   MRN: [id number]

## 2023-01-17 NOTE — Progress Notes (Signed)
CC: Patient is starting to feel better.  She was able to eat some food yesterday.  She received blood and platelets yesterday.  She was able to sit in the chair yesterday.  She would like to see if she can walk a little bit today.  HPI: Patient with metastatic breast cancer with extensive liver metastases with rapidly progressing disease was treated with first cycle of Enhertu 01/15/2023.  The bilirubin appears to be slowly improving.  She is also starting to feel slightly better.    Latest Ref Rng & Units 01/17/2023    5:40 AM 01/16/2023    8:15 AM 01/15/2023    5:54 AM  CBC  WBC 4.0 - 10.5 K/uL 8.4  7.7  7.3   Hemoglobin 12.0 - 15.0 g/dL 9.2  7.8  8.4   Hematocrit 36.0 - 46.0 % 28.5  23.7  25.6   Platelets 150 - 400 K/uL 24  15  25        Latest Ref Rng & Units 01/17/2023    5:40 AM 01/16/2023    8:15 AM 01/15/2023    5:54 AM  CMP  Glucose 70 - 99 mg/dL 409  811  914   BUN 8 - 23 mg/dL 30  22  14    Creatinine 0.44 - 1.00 mg/dL 7.82  9.56  2.13   Sodium 135 - 145 mmol/L 132  132  132   Potassium 3.5 - 5.1 mmol/L 4.4  4.2  4.2   Chloride 98 - 111 mmol/L 101  102  103   CO2 22 - 32 mmol/L 20  21  20    Calcium 8.9 - 10.3 mg/dL 7.7  7.9  8.2   Total Protein 6.5 - 8.1 g/dL 5.6  5.6  5.9   Total Bilirubin 0.3 - 1.2 mg/dL 4.2  5.0  5.6   Alkaline Phos 38 - 126 U/L 265  281  292   AST 15 - 41 U/L 309  296  252   ALT 0 - 44 U/L 72  65  56    Assessment and plan: Metastatic breast cancer with rapidly worsening disease including liver metastases: Status post first cycle of Enhertu.  Bilirubin today is 4.2.  AST is still elevated at 309 but the alkaline phosphatase is slightly better at 265. Anemia and thrombocytopenia: Status post 1 unit of PRBC and platelets yesterday.  We will hold off on any transfusions today. Failure to thrive: Related to metastatic disease.  She is feeling like she can eat a little bit more food. Spine metastases: Radiation will be completed on Wednesday. Discharge  planning: I discussed with the patient that as the labs improving her performance status improves she may be able to be discharged in the next 24 to 48 hours.  However she will need home physical therapy

## 2023-01-17 NOTE — Progress Notes (Addendum)
Critical platelet notification from lab. Platelets 24 this am. This is consistent with previous lab values, actually up from yesterday's critical value of 15 .Theresa [name]

## 2023-01-17 NOTE — Progress Notes (Signed)
PROGRESS NOTE    Theresa [name]  QMV:[id number] DOB: [birth year] DOA: 01/11/2023 PCP: Carylon Perches, MD   Brief Narrative: 67 year old past medical history significant for breast cancer with metastasis to liver and spine on chemotherapy and radiation therapy presented to the ED with shortness of breath, fatigue, productive cough.  CT was negative for PE, showed groundglass opacity.  Patient was a started on IV antibiotics.  Patient developed worsening thrombocytopenia during hospitalization.  She continued to require oxygen supplementation.  She developed worsening hyperbilirubinemia and transaminases.  MRCP has been ordered.  Depending on results she might need GI or IR consultation.  For the acute on chronic respiratory failure, continue to remain on 3 L of oxygen, continue with IV antibiotics.  Pulmonologist has been consulted.    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  -Patient presented with shortness of breath, cough, CT angio show groundglass opacities suspicious for multifocal pneumonia. -Patient has completed 5 days of azithromycin 5/17.  Continue with IV ceftriaxone. Will complete 7 days treatment.  -There was some concern about lymphangitic spread of the tumor by oncology. -Continue with schedule guaifenesin and nebulizer -BNP 400 range, echo normal Ef, pulmonary shunt,  severely elevated pulmonary pressure.  await pulmonologist evaluation.  -MRSA PCR negative.  -She received low dose IV steroid 5/17 -Oxygen sat 94 % on 3 L.  -she has cough today, will give  dose IV lasix.   2-Right side breast Ca, metastatic diseases to liver and spine.  -S/p lumpectomy.  She was on letrozole and Verzenio.  Currently on hold. Completed radiation treatment.  -Dr. Georgiann Mohs following and appreciate assistance  3-Severe thrombocytopenia: -Monitor for bleeding. Could be in the setting of infection, side effect of chemotherapy, metastatic disease. -She received 2 units of platelets on 5/15.  Platelet increased to 50--- now down to 25 -She will need platelets transfusion if she requires any procedure -Received Platelet transfusion 5/18. Platelet today at 25.   4-Hyperbilirubinemia , transaminases.  -Worsening LFT today, Bilirubin increase to 5.  -MRCP; negative for biliary or extra hepatic biliary  obstruction.  Extensive hepatic metastasis in the RIGHT hepatic lobe and smaller metastasis in the LEFT hepatic lobe. Overall volume of the liver is enlarged by metastasis and mildly increased from comparison PET-CT scan in February.  Dr Pamelia Hoit,  started Chemo 5/17 lower dose.  LFT mildly increase AST, ALT. Bilirubin down to 4.2  5-Hyponatremia:  Improved with sodium tablet.  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  Hold sodium tablet, getting IV lasix.  Stable.   6-Macrocytic anemia, in the setting of malignancy.  She received 1 unit of packed red blood cell 5/15, transfusion had to be stopped because of concern of transfusion reaction with fever. Received one unit PRBC 5/18 Hb at 9   7-Debility/deconditioning: She will need home health PT  8-goals of care: Patient with poor prognosis due to metastatic disease. Husband prefer to discuss Code status with Dr Pamelia Hoit. I offer palliative care consultation.   T10 vertebra compression fracture: Continue with supportive care pain management.  -Status post kyphoplasty for T9 compression fracture.  Nutrition Problem: Increased nutrient needs Etiology: cancer and cancer related treatments    Signs/Symptoms: estimated  needs    Interventions: Ensure Enlive (each supplement provides 350kcal and 20 grams of protein)  Estimated body mass index is 24.36 kg/m as calculated from the following:   Height as of this encounter: 5\' 5"  (1.651 m).   Weight as of this encounter: 66.4 kg.   DVT prophylaxis: SCD Code Status: Full code Family Communication: Husband at bedside.  Disposition Plan:  Status is: Inpatient Remains inpatient appropriate because: management, resp failure    Consultants:  Pulmonologist Dr Pamelia Hoit  Procedures:    Antimicrobials:    Subjective: She is sleepy today. She has been eating more. She didn't want to get ativan in am.   Objective: Vitals:   01/17/23 0556 01/17/23 0742 01/17/23 1315 01/17/23 1433  BP: 114/69  131/76   Pulse: 90  86   Resp: 18  18   Temp: 98.1 F (36.7 C)  (!) 97.3 F (36.3 C)   TempSrc: Oral  Oral   SpO2: 96% 97% 99% 98%  Weight:      Height:        Intake/Output Summary (Last 24 hours) at 01/17/2023 1552 Last data filed at 01/17/2023 1412 Gross per 24 hour  Intake 865 ml  Output 1850 ml  Net -985 ml    Filed Weights   01/11/23 0951 01/11/23 1312  Weight: 63.5 kg 66.4 kg    Examination:  General exam: Alert, Chronic ill appearing.  Respiratory system: BL crackles.  Cardiovascular system: S 1, S 2 RRR Gastrointestinal system: BS present, soft, nt Central nervous system: Alert Extremities: no edema   Data Reviewed: I have personally reviewed following labs and imaging studies  CBC: Recent Labs  Lab 01/11/23 1011 01/11/23 1015 01/13/23 0537 01/14/23 0102 01/15/23 0554 01/16/23 0815 01/17/23 0540  WBC 4.9   < > 4.8 4.9 7.3 7.7 8.4  NEUTROABS 2.7  --   --   --   --   --   --   HGB 10.1*   < > 8.2* 7.8* 8.4* 7.8* 9.2*  HCT 30.8*   < > 25.5* 24.0* 25.6* 23.7* 28.5*  MCV 101.3*   < > 103.7* 102.1* 101.6* 101.7* 98.6  PLT 40*   < > 29* 50* 25* 15* 24*   < > = values in this interval not displayed.    Basic Metabolic  Panel: Recent Labs  Lab 01/13/23 0537 01/14/23 0102 01/15/23 0554 01/16/23 0815 01/17/23 0540  NA 133* 128* 132* 132* 132*  K 4.0 3.6 4.2 4.2 4.4  CL 104 101 103 102 101  CO2 21* 20* 20* 21* 20*  GLUCOSE 113* 125* 109* 143* 131*  BUN 11 13 14 22  30*  CREATININE 0.91 0.70 0.69 0.61 0.88  CALCIUM 7.9* 7.9* 8.2* 7.9* 7.7*    GFR: Estimated Creatinine Clearance: 56.6 mL/min (by C-G formula based on SCr of 0.88 mg/dL). Liver Function Tests: Recent Labs  Lab 01/13/23 0537 01/14/23 0102 01/15/23 0554 01/16/23 0815 01/17/23 0540  AST 197* 199* 252* 296* 309*  ALT 51* 49* 56* 65* 72*  ALKPHOS 264* 286* 292* 281* 265*  BILITOT 2.1* 2.4* 5.6* 5.0* 4.2*  PROT 5.5* 5.5* 5.9* 5.6* 5.6*  ALBUMIN 2.6* 2.6* 2.7* 2.6* 2.6*    Recent Labs  Lab 01/11/23 1011  LIPASE 85*    No results for input(s): "AMMONIA" in the last 168 hours. Coagulation Profile: Recent Labs  Lab 01/11/23 1114  INR 1.4*  Cardiac Enzymes: No results for input(s): "CKTOTAL", "CKMB", "CKMBINDEX", "TROPONINI" in the last 168 hours. BNP (last 3 results) No results for input(s): "PROBNP" in the last 8760 hours. HbA1C: No results for input(s): "HGBA1C" in the last 72 hours. CBG: No results for input(s): "GLUCAP" in the last 168 hours. Lipid Profile: No results for input(s): "CHOL", "HDL", "LDLCALC", "TRIG", "CHOLHDL", "LDLDIRECT" in the last 72 hours. Thyroid Function Tests: No results for input(s): "TSH", "T4TOTAL", "FREET4", "T3FREE", "THYROIDAB" in the last 72 hours. Anemia Panel: No results for input(s): "VITAMINB12", "FOLATE", "FERRITIN", "TIBC", "IRON", "RETICCTPCT" in the last 72 hours.  Sepsis Labs: Recent Labs  Lab 01/13/23 2154 01/15/23 0554  PROCALCITON  --  0.63  LATICACIDVEN 1.9  --      Recent Results (from the past 240 hour(s))  SARS Coronavirus 2 by RT PCR (hospital order, performed in [facility] hospital lab) *cepheid single result test* Anterior Nasal Swab     Status: None    Collection Time: 01/11/23 10:11 AM   Specimen: Anterior Nasal Swab  Result Value Ref Range Status   SARS Coronavirus 2 by RT PCR NEGATIVE NEGATIVE Final    Comment: (NOTE) SARS-CoV-2 target nucleic acids are NOT DETECTED.  The SARS-CoV-2 RNA is generally detectable in upper and lower respiratory specimens during the acute phase of infection. The lowest concentration of SARS-CoV-2 viral copies this assay can detect is 250 copies / mL. A negative result does not preclude SARS-CoV-2 infection and should not be used as the sole basis for treatment or other patient management decisions.  A negative result may occur with improper specimen collection / handling, submission of specimen other than nasopharyngeal swab, presence of viral mutation(s) within the areas targeted by this assay, and inadequate number of viral copies (<250 copies / mL). A negative result must be combined with clinical observations, patient history, and epidemiological information.  Fact Sheet for Patients:   RoadLapTop.co.za  Fact Sheet for Healthcare Providers: http://kim-miller.com/  This test is not yet approved or  cleared by the [place] FDA and has been authorized for detection and/or diagnosis of SARS-CoV-2 by FDA under an Emergency Use Authorization (EUA).  This EUA will remain in effect (meaning this test can be used) for the duration of the COVID-19 declaration under Section 564(b)(1) of the Act, 21 U.S.C. section 360bbb-3(b)(1), unless the authorization is terminated or revoked sooner.  Performed at [facility], [address] [place], [place] [address]   MRSA Next Gen by PCR, Nasal     Status: None   Collection Time: 01/15/23  3:46 PM   Specimen: Nasal Mucosa; Nasal Swab  Result Value Ref Range Status   MRSA by PCR Next Gen NOT DETECTED NOT DETECTED Final    Comment: (NOTE) The GeneXpert MRSA Assay (FDA approved for NASAL  specimens only), is one component of a comprehensive MRSA colonization surveillance program. It is not intended to diagnose MRSA infection nor to guide or monitor treatment for MRSA infections. Test performance is not FDA approved in patients less than 41 years old. Performed at [facility], [address], [place], [place] [address]          Radiology Studies: ECHOCARDIOGRAM COMPLETE BUBBLE STUDY  Result Date: 01/16/2023    ECHOCARDIOGRAM REPORT   Patient Name:   ELICA ZOLL Date of Exam: 01/16/2023 Medical Rec #:  [id number]     Height:       65.0 in Accession #:    [id number]    Weight:  146.4 lb Date of Birth:  08-Dec-1955     BSA:          1.732 m Patient Age:    67 years      BP:           113/67 mmHg Patient Gender: F             HR:           104 bpm. Exam Location:  Inpatient Procedure: 2D Echo, Cardiac Doppler and Color Doppler Indications:    Hypoxia  History:        Patient has prior history of Echocardiogram examinations. Risk                 Factors:Hypertension.  Sonographer:    Mike Gip Referring Phys: [phone] Vylet Maffia A Kellee Sittner IMPRESSIONS  1. 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 Moderate RV dilation with normal function. Severely elevated pulmonary pressures.  2. Right ventricular systolic function is normal. The right ventricular size is moderately enlarged. There is severely elevated pulmonary artery systolic pressure. The estimated right ventricular systolic pressure is 72.2 mmHg.  3. Left ventricular ejection fraction, by estimation, is 55 to 60%. The left ventricle has normal function. The left ventricle has no regional wall motion abnormalities. Indeterminate diastolic filling due to E-A fusion. There is the interventricular septum is flattened in systole, consistent with right ventricular pressure overload and the interventricular septum is flattened in systole and diastole, consistent with right ventricular pressure and volume  overload.  4. The mitral valve is grossly normal. Trivial mitral valve regurgitation. No evidence of mitral stenosis.  5. Tricuspid valve regurgitation is mild to moderate.  6. The aortic valve is tricuspid. Aortic valve regurgitation is not visualized. No aortic stenosis is present.  7. The inferior vena cava is dilated in size with <50% respiratory variability, suggesting right atrial pressure of 15 mmHg.  8. Agitated saline contrast bubble study was positive with shunting observed after >6 cardiac cycles suggestive of intrapulmonary shunting. FINDINGS  Left Ventricle: Left ventricular ejection fraction, by estimation, is 55 to 60%. The left ventricle has normal function. The left ventricle has no regional wall motion abnormalities. The left ventricular internal cavity size was normal in size. There is  no left ventricular hypertrophy. The interventricular septum is flattened in systole, consistent with right ventricular pressure overload and the interventricular septum is flattened in systole and diastole, consistent with right ventricular pressure and volume overload. Indeterminate diastolic filling due to E-A fusion. Right Ventricle: The right ventricular size is moderately enlarged. No increase in right ventricular wall thickness. Right ventricular systolic function is normal. There is severely elevated pulmonary artery systolic pressure. The tricuspid regurgitant velocity is 3.78 m/s, and with an assumed right atrial pressure of 15 mmHg, the estimated right ventricular systolic pressure is 72.2 mmHg. Left Atrium: Left atrial size was normal in size. Right Atrium: Right atrial size was normal in size. Pericardium: There is no evidence of pericardial effusion. Mitral Valve: The mitral valve is grossly normal. Trivial mitral valve regurgitation. No evidence of mitral valve stenosis. Tricuspid Valve: The tricuspid valve is grossly normal. Tricuspid valve regurgitation is mild to moderate. No evidence of tricuspid  stenosis. Aortic Valve: The aortic valve is tricuspid. Aortic valve regurgitation is not visualized. No aortic stenosis is present. Pulmonic Valve: The pulmonic valve was grossly normal. Pulmonic valve regurgitation is trivial. No evidence of pulmonic stenosis. Aorta: The aortic root and ascending aorta are structurally normal, with no evidence of  dilitation. Venous: The inferior vena cava is dilated in size with less than 50% respiratory variability, suggesting right atrial pressure of 15 mmHg. IAS/Shunts: The atrial septum is grossly normal. Agitated saline contrast was given intravenously to evaluate for intracardiac shunting. Agitated saline contrast bubble study was positive with shunting observed after >6 cardiac cycles suggestive of intrapulmonary shunting.  LEFT VENTRICLE PLAX 2D LVIDd:         3.70 cm     Diastology LVIDs:         2.60 cm     LV e' medial:    7.18 cm/s LV PW:         1.00 cm     LV E/e' medial:  11.0 LV IVS:        1.00 cm     LV e' lateral:   9.03 cm/s LVOT diam:     2.00 cm     LV E/e' lateral: 8.8 LV SV:         58 LV SV Index:   33 LVOT Area:     3.14 cm  LV Volumes (MOD) LV vol d, MOD A2C: 93.7 ml LV vol d, MOD A4C: 69.2 ml LV vol s, MOD A2C: 39.4 ml LV vol s, MOD A4C: 30.2 ml LV SV MOD A2C:     54.3 ml LV SV MOD A4C:     69.2 ml LV SV MOD BP:      49.8 ml RIGHT VENTRICLE             IVC RV Basal diam:  4.80 cm     IVC diam: 2.40 cm RV S prime:     14.40 cm/s TAPSE (M-mode): 2.2 cm LEFT ATRIUM             Index        RIGHT ATRIUM           Index LA diam:        2.90 cm 1.67 cm/m   RA Area:     20.20 cm LA Vol (A2C):   55.6 ml 32.09 ml/m  RA Volume:   59.50 ml  34.34 ml/m LA Vol (A4C):   35.2 ml 20.32 ml/m LA Biplane Vol: 46.2 ml 26.67 ml/m  AORTIC VALVE LVOT Vmax:   97.90 cm/s LVOT Vmean:  62.700 cm/s LVOT VTI:    0.184 m  AORTA Ao Root diam: 3.20 cm Ao Asc diam:  3.30 cm MITRAL VALVE               TRICUSPID VALVE MV Area (PHT): 5.16 cm    TR Peak grad:   57.2 mmHg MV Decel  Time: 147 msec    TR Vmax:        378.00 cm/s MV E velocity: 79.20 cm/s MV A velocity: 98.80 cm/s  SHUNTS MV E/A ratio:  0.80        Systemic VTI:  0.18 m                            Systemic Diam: 2.00 cm Lennie Odor MD Electronically signed by Lennie Odor MD Signature Date/Time: 01/16/2023/12:23:45 PM    Final         Scheduled Meds:  famotidine  10 mg Oral BID   feeding supplement  1 Container Oral TID BM   gabapentin  300 mg Oral QHS   guaiFENesin  1,200 mg Oral BID   ipratropium  0.5 mg Nebulization TID  levalbuterol  0.63 mg Nebulization TID   venlafaxine XR  37.5 mg Oral Daily   Continuous Infusions:  cefTRIAXone (ROCEPHIN)  IV 2 g (01/17/23 1034)     LOS: 5 days    Time spent: 35 minutes    Niklas Chretien A Daking Westervelt, MD [facility]   If 7PM-7AM, please contact night-coverage [email or web address]  01/17/2023, 3:52 PM

## 2023-01-18 ENCOUNTER — Other Ambulatory Visit: Payer: Self-pay

## 2023-01-18 ENCOUNTER — Inpatient Hospital Stay: Payer: Medicare HMO

## 2023-01-18 ENCOUNTER — Inpatient Hospital Stay: Payer: Medicare HMO | Admitting: Hematology and Oncology

## 2023-01-18 ENCOUNTER — Ambulatory Visit
Admission: RE | Admit: 2023-01-18 | Discharge: 2023-01-18 | Disposition: A | Discharge status: Home / Self Care | Payer: Medicare HMO | Source: Ambulatory Visit | Attending: Radiation Oncology | Admitting: Radiation Oncology

## 2023-01-18 ENCOUNTER — Inpatient Hospital Stay (HOSPITAL_COMMUNITY): Payer: Medicare HMO

## 2023-01-18 DIAGNOSIS — I2729 Other secondary pulmonary hypertension: Secondary | ICD-10-CM

## 2023-01-18 DIAGNOSIS — J9601 Acute respiratory failure with hypoxia: Secondary | ICD-10-CM | POA: Diagnosis not present

## 2023-01-18 HISTORY — PX: IR IMAGING GUIDED PORT INSERTION: IMG5740

## 2023-01-18 LAB — CBC WITH DIFFERENTIAL/PLATELET
Abs Immature Granulocytes: 0.24 10*3/uL — ABNORMAL HIGH (ref 0.00–0.07)
Basophils Absolute: 0 10*3/uL (ref 0.0–0.1)
Basophils Relative: 0 %
Eosinophils Absolute: 0 10*3/uL (ref 0.0–0.5)
Eosinophils Relative: 0 %
HCT: 27.8 % — ABNORMAL LOW (ref 36.0–46.0)
Hemoglobin: 9.1 g/dL — ABNORMAL LOW (ref 12.0–15.0)
Immature Granulocytes: 4 %
Lymphocytes Relative: 13 %
Lymphs Abs: 0.8 10*3/uL (ref 0.7–4.0)
MCH: 31.9 pg (ref 26.0–34.0)
MCHC: 32.7 g/dL (ref 30.0–36.0)
MCV: 97.5 fL (ref 80.0–100.0)
Monocytes Absolute: 0.2 10*3/uL (ref 0.1–1.0)
Monocytes Relative: 3 %
Neutro Abs: 5.1 10*3/uL (ref 1.7–7.7)
Neutrophils Relative %: 80 %
Platelets: 20 10*3/uL — CL (ref 150–400)
RBC: 2.85 MIL/uL — ABNORMAL LOW (ref 3.87–5.11)
RDW: 23.6 % — ABNORMAL HIGH (ref 11.5–15.5)
WBC: 6.4 10*3/uL (ref 4.0–10.5)
nRBC: 2.6 % — ABNORMAL HIGH (ref 0.0–0.2)

## 2023-01-18 LAB — TYPE AND SCREEN
ABO/RH(D): A POS
ABO/RH(D): A POS
Antibody Screen: NEGATIVE
Antibody Screen: NEGATIVE
Unit division: 0
Unit division: 0

## 2023-01-18 LAB — COMPREHENSIVE METABOLIC PANEL
ALT: 77 U/L — ABNORMAL HIGH (ref 0–44)
AST: 298 U/L — ABNORMAL HIGH (ref 15–41)
Albumin: 2.6 g/dL — ABNORMAL LOW (ref 3.5–5.0)
Alkaline Phosphatase: 276 U/L — ABNORMAL HIGH (ref 38–126)
Anion gap: 7 (ref 5–15)
BUN: 29 mg/dL — ABNORMAL HIGH (ref 8–23)
CO2: 22 mmol/L (ref 22–32)
Calcium: 7.7 mg/dL — ABNORMAL LOW (ref 8.9–10.3)
Chloride: 102 mmol/L (ref 98–111)
Creatinine, Ser: 0.76 mg/dL (ref 0.44–1.00)
GFR, Estimated: >60 mL/min (ref >60)
Glucose, Bld: 108 mg/dL — ABNORMAL HIGH (ref 70–99)
Potassium: 4.9 mmol/L (ref 3.5–5.1)
Sodium: 131 mmol/L — ABNORMAL LOW (ref 135–145)
Total Bilirubin: 3.5 mg/dL — ABNORMAL HIGH (ref 0.3–1.2)
Total Protein: 5.6 g/dL — ABNORMAL LOW (ref 6.5–8.1)

## 2023-01-18 LAB — PREPARE PLATELET PHERESIS: Unit division: 0

## 2023-01-18 LAB — RAD ONC ARIA SESSION SUMMARY
Course Elapsed Days: 12
Plan Fractions Treated to Date: 8
Plan Prescribed Dose Per Fraction: 3 Gy
Plan Total Fractions Prescribed: 10
Plan Total Prescribed Dose: 30 Gy
Reference Point Dosage Given to Date: 24 Gy
Reference Point Session Dosage Given: 3 Gy
Session Number: 8

## 2023-01-18 LAB — BPAM PLATELET PHERESIS
Blood Product Expiration Date: 202405232359
ISSUE DATE / TIME: 202405181301
ISSUE DATE / TIME: 202405201307
Unit Type and Rh: 6200

## 2023-01-18 LAB — BPAM RBC
Blood Product Expiration Date: 202406092359
ISSUE DATE / TIME: 202405181530
Unit Type and Rh: 6200
Unit Type and Rh: 6200

## 2023-01-18 LAB — GLUCOSE, CAPILLARY: Glucose-Capillary: 92 mg/dL (ref 70–99)

## 2023-01-18 MED ORDER — MIDAZOLAM HCL 2 MG/2ML IJ SOLN
INTRAMUSCULAR | Status: AC
  Filled 2023-01-18: qty 4

## 2023-01-18 MED ORDER — FENTANYL CITRATE (PF) 100 MCG/2ML IJ SOLN
INTRAMUSCULAR | Status: AC
  Filled 2023-01-18: qty 2

## 2023-01-18 MED ORDER — LIDOCAINE-EPINEPHRINE 1 %-1:100000 IJ SOLN
20.0000 mL | Freq: Once | INTRAMUSCULAR | Status: AC
  Administered 2023-01-18: 20 mL via INTRADERMAL

## 2023-01-18 MED ORDER — DEXTROMETHORPHAN POLISTIREX ER 30 MG/5ML PO SUER: 30.0000 mg | Freq: Two times a day (BID) | ORAL | Status: DC | PRN

## 2023-01-18 MED ORDER — FUROSEMIDE 10 MG/ML IJ SOLN
20.0000 mg | Freq: Once | INTRAMUSCULAR | Status: AC
  Administered 2023-01-18: 20 mg via INTRAVENOUS
  Filled 2023-01-18: qty 2

## 2023-01-18 MED ORDER — MIDAZOLAM HCL 2 MG/2ML IJ SOLN
INTRAMUSCULAR | Status: AC | PRN
  Administered 2023-01-18 (×2): 1 mg via INTRAVENOUS

## 2023-01-18 MED ORDER — IPRATROPIUM BROMIDE 0.02 % IN SOLN
0.5000 mg | Freq: Two times a day (BID) | RESPIRATORY_TRACT | Status: DC
  Administered 2023-01-18 – 2023-01-21 (×6): 0.5 mg via RESPIRATORY_TRACT
  Filled 2023-01-18 (×7): qty 2.5

## 2023-01-18 MED ORDER — SODIUM CHLORIDE 0.9% IV SOLUTION: Freq: Once | INTRAVENOUS | Status: AC

## 2023-01-18 MED ORDER — LIDOCAINE-EPINEPHRINE 1 %-1:100000 IJ SOLN
INTRAMUSCULAR | Status: AC
  Filled 2023-01-18: qty 1

## 2023-01-18 MED ORDER — LEVALBUTEROL HCL 0.63 MG/3ML IN NEBU
0.6300 mg | INHALATION_SOLUTION | Freq: Two times a day (BID) | RESPIRATORY_TRACT | Status: DC
  Administered 2023-01-18 – 2023-01-21 (×6): 0.63 mg via RESPIRATORY_TRACT
  Filled 2023-01-18 (×7): qty 3

## 2023-01-18 MED ORDER — FENTANYL CITRATE (PF) 100 MCG/2ML IJ SOLN
INTRAMUSCULAR | Status: AC | PRN
  Administered 2023-01-18 (×2): 50 ug via INTRAVENOUS

## 2023-01-18 NOTE — Procedures (Signed)
Vascular and Interventional Radiology Procedure Note  Patient: Theresa [name] DOB: December 19, 1955 Medical Record Number: [id number] Note Date/Time: 01/18/23 3:33 PM   Performing Physician: Roanna Banning, MD Assistant(s): None  Diagnosis: Breast cancer, metastatic  Procedure: PORT PLACEMENT  Anesthesia: Conscious Sedation Complications: None Estimated Blood Loss: Minimal  Findings:  Successful right-sided port placement, with the tip of the catheter in the proximal right atrium.  Plan: Catheter ready for use.  See detailed procedure note with images in PACS. The patient tolerated the procedure well without incident or complication and was returned to Recovery in stable condition.    Roanna Banning, MD [facility]   Pager. [phone] Clinic. [phone]

## 2023-01-18 NOTE — Care Management Important Message (Signed)
Important Message  Patient Details IM Letter given. Name: BAYA PRESSLER MRN: [id number] Date of Birth: 1955-12-23   Medicare Important Message Given:  Yes     Caren Macadam 01/18/2023, 10:28 AM

## 2023-01-18 NOTE — Progress Notes (Signed)
Physical Therapy Treatment Patient Details Name: Theresa [name] MRN: [id number] DOB: [birth year] Today's Date: 01/18/2023   History of Present Illness 67 yo female admitted to hospital on 01/11/2023 due to SOB, persistent cough and worsening back pain. Pt was found to be hypoxic secondary to atypical PNA and is now requiring supplemental O2 as well as worsening LFTs. Imaging revealed T9 and T10 compression fx. Pt fitted for TLSO. Pt has significant PMH for metastatic breast ca to bone and liver, s/p chemotherapy and radiation, HTN and vertigo.    PT Comments    Pt reports she's weak and fatigued, she's been NPO since midnight for upcoming port placement today. Pt declined getting out of bed but agreed to bed level exercises.     Recommendations for follow up therapy are one component of a multi-disciplinary discharge planning process, led by the attending physician.  Recommendations may be updated based on patient status, additional functional criteria and insurance authorization.  Follow Up Recommendations       Assistance Recommended at Discharge Intermittent Supervision/Assistance  Patient can return home with the following A little help with walking and/or transfers;A little help with bathing/dressing/bathroom;Assistance with cooking/housework;Assist for transportation;Help with stairs or ramp for entrance   Equipment Recommendations  Rollator (4 wheels);Wheelchair (measurements PT);Wheelchair cushion (measurements PT)    Recommendations for Other Services       Precautions / Restrictions Precautions Precautions: Fall Precaution Comments: monitor O2 Required Braces or Orthoses: Spinal Brace Spinal Brace: Thoracolumbosacral orthotic (For comfort) Restrictions Weight Bearing Restrictions: No Other Position/Activity Restrictions: Log Roll     Mobility  Bed Mobility               General bed mobility comments: deferred -pt stated she's feeling tired and weak, she's been  NPO since midnight, awaiting port placement today    Transfers                        Ambulation/Gait                   Stairs             Wheelchair Mobility    Modified Rankin (Stroke Patients Only)       Balance                                            Cognition Arousal/Alertness: Awake/alert Behavior During Therapy: WFL for tasks assessed/performed Overall Cognitive Status: Within Functional Limits for tasks assessed                                          Exercises General Exercises - Upper Extremity Shoulder Flexion: AROM, Both, 10 reps, Supine General Exercises - Lower Extremity Ankle Circles/Pumps: AROM, Both, 15 reps, Supine Short Arc Quad: AROM, Both, 10 reps, Supine Heel Slides: AROM, Both, 10 reps, Supine Hip ABduction/ADduction: AROM, Both, 10 reps, Supine    General Comments        Pertinent Vitals/Pain Pain Assessment Pain Assessment: No/denies pain    Home Living                          Prior Function  PT Goals (current goals can now be found in the care plan section) Acute Rehab PT Goals Patient Stated Goal: to go home PT Goal Formulation: With patient Time For Goal Achievement: 01/26/23 Potential to Achieve Goals: Good Progress towards PT goals: Progressing toward goals    Frequency    Min 1X/week      PT Plan Current plan remains appropriate    Co-evaluation              AM-PAC PT "6 Clicks" Mobility   Outcome Measure  Help needed turning from your back to your side while in a flat bed without using bedrails?: A Little Help needed moving from lying on your back to sitting on the side of a flat bed without using bedrails?: A Little Help needed moving to and from a bed to a chair (including a wheelchair)?: A Little Help needed standing up from a chair using your arms (e.g., wheelchair or bedside chair)?: A Little Help needed to walk  in hospital room?: A Little Help needed climbing 3-5 steps with a railing? : A Little 6 Click Score: 18    End of Session   Activity Tolerance: No increased pain;Patient limited by lethargy Patient left: in bed;with bed alarm set;with family/visitor present;with call bell/phone within reach Nurse Communication: Mobility status PT Visit Diagnosis: Unsteadiness on feet (R26.81);Other abnormalities of gait and mobility (R26.89);Muscle weakness (generalized) (M62.81)     Time: 1610-9604 PT Time Calculation (min) (ACUTE ONLY): 8 min  Charges:  $Therapeutic Exercise: 8-22 mins                     Ralene Bathe Kistler PT 01/18/2023  Acute Rehabilitation Services  Office [phone]

## 2023-01-18 NOTE — Progress Notes (Addendum)
PROGRESS NOTE    Theresa [name]  WUJ:[id number] DOB: 1955/12/30 DOA: 01/11/2023 PCP: Carylon Perches, MD   Brief Narrative: 67 year old past medical history significant for breast cancer with metastasis to liver and spine on chemotherapy and radiation therapy presented to the ED with shortness of breath, fatigue, productive cough.  CT was negative for PE, showed groundglass opacity.  Patient was a started on IV antibiotics.  Patient developed worsening thrombocytopenia during hospitalization.  She continued to require oxygen supplementation.  She developed worsening hyperbilirubinemia and transaminases.  MRCP has been ordered.  Depending on results she might need GI or IR consultation.  For the acute on chronic respiratory failure, continue to remain on 3 L of oxygen, continue with IV antibiotics.  Pulmonologist has been consulted.    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  -Patient presented with shortness of breath, cough, CT angio show groundglass opacities suspicious for multifocal pneumonia. -Patient has completed 5 days of azithromycin 5/17.  Continue with IV ceftriaxone. Will complete 7 days treatment.  -There was some concern about lymphangitic spread of the tumor by oncology. -Continue with schedule guaifenesin and nebulizer -BNP 400 range, echo normal Ef, pulmonary shunt,  severely elevated pulmonary pressure.  -MRSA PCR negative.  -She received low dose IV steroid 5/17 -Oxygen sat 94 % on 3 L.  -Received IV lasix 5/19, will give IV lasix today between Platelet  transfusion.  She might need low dose lasix at discharge.   2-Right side breast Ca, metastatic diseases to liver and spine.  -S/p lumpectomy.  She was on letrozole and Verzenio.  Currently on hold. -Undergoing radiation treatments.  -Dr. Georgiann Mohs following and appreciate assistance  3-Severe thrombocytopenia: -Monitor for bleeding. Could be in the setting of infection, side effect of chemotherapy, metastatic disease. -She received 2 units of platelets on 5/15.  Platelet increased to 50--- now down to 25 -She will need platelets transfusion if she requires any procedure -Received Platelet transfusion 5/18.  Platelet down to 20. Plan to give 2 units in anticipation of port placement today.   4-Hyperbilirubinemia , transaminases.  -Worsening LFT today, Bilirubin increase to 5.  -MRCP; negative for biliary or extra hepatic biliary  obstruction.  Extensive hepatic metastasis in the RIGHT hepatic lobe and smaller metastasis in the LEFT hepatic lobe. Overall volume of the liver is enlarged by metastasis and mildly increased from comparison PET-CT scan in February.  Dr Pamelia Hoit,  started Chemo 5/17 lower dose.  LFT mildly increase AST, ALT. Bilirubin down to  3.5  5-Hyponatremia:  Improved with sodium tablet.  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  Hold sodium tablet, getting IV lasix.  Stable.   6-Macrocytic anemia, in the setting of malignancy.  She received 1 unit of packed red blood cell 5/15, transfusion had to be stopped because of concern of transfusion reaction with fever. Received one unit PRBC 5/18 Hb at 9   7-Debility/deconditioning: She will need home health PT  8-goals of care: Patient with poor prognosis due to metastatic disease.   T10 vertebra compression fracture: Continue with supportive care pain management.  -Status  post kyphoplasty for T9 compression fracture.   Nutrition Problem: Increased nutrient needs Etiology: cancer and cancer related treatments    Signs/Symptoms: estimated  needs    Interventions: Ensure Enlive (each supplement provides 350kcal and 20 grams of protein)  Estimated body mass index is 24.36 kg/m as calculated from the following:   Height as of this encounter: 5\' 5"  (1.651 m).   Weight as of this encounter: 66.4 kg.   DVT prophylaxis: SCD Code Status: Full code Family Communication: Husband at bedside.  Disposition Plan:  Status is: Inpatient Remains inpatient appropriate because: management, resp failure    Consultants:  Pulmonologist Dr Pamelia Hoit  Procedures:    Antimicrobials:    Subjective: She had busy day yesterday with many visitors. She walk in the hall yesterday. She ate 3 meals yesterday, more than what she has been eating.  She is very sleep today. She got ativan last night to help her sleep.   Objective: Vitals:   01/18/23 1044 01/18/23 1146 01/18/23 1317 01/18/23 1332  BP: 113/62 138/84 125/74 128/74  Pulse: 88 89 84 87  Resp: 17 16 16 17   Temp: 97.8 F (36.6 C) 98 F (36.7 C) 97.7 F (36.5 C) (!) 97.4 F (36.3 C)  TempSrc:  Oral Oral   SpO2: 100%     Weight:      Height:        Intake/Output Summary (Last 24 hours) at 01/18/2023 1540 Last data filed at 01/18/2023 1400 Gross per 24 hour  Intake 854 ml  Output 775 ml  Net 79 ml    Filed Weights   01/11/23 0951 01/11/23 1312  Weight: 63.5 kg 66.4 kg    Examination:  General exam:sleepy, chronic ill appearing.  Respiratory system: BL fine crackles.  Cardiovascular system: S 1, S 2 RRR Gastrointestinal system: BS present, soft, nt Central nervous system: sleepy  Extremities: no edema   Data Reviewed: I have personally reviewed following labs and imaging studies  CBC: Recent Labs  Lab 01/14/23 0102 01/15/23 0554 01/16/23 0815 01/17/23 0540 01/18/23 0538  WBC 4.9 7.3 7.7 8.4 6.4  NEUTROABS  --   --   --   --  5.1  HGB 7.8* 8.4* 7.8* 9.2* 9.1*  HCT 24.0* 25.6* 23.7* 28.5* 27.8*  MCV 102.1* 101.6* 101.7* 98.6 97.5  PLT 50* 25* 15* 24*  20*    Basic Metabolic Panel: Recent Labs  Lab 01/14/23 0102 01/15/23 0554 01/16/23 0815 01/17/23 0540 01/18/23 0538  NA 128* 132* 132* 132* 131*  K 3.6 4.2 4.2 4.4 4.9  CL 101 103 102 101 102  CO2 20* 20* 21* 20* 22  GLUCOSE 125* 109* 143* 131* 108*  BUN 13 14 22  30* 29*  CREATININE 0.70 0.69 0.61 0.88 0.76  CALCIUM 7.9* 8.2* 7.9* 7.7* 7.7*    GFR: Estimated Creatinine Clearance: 62.2 mL/min (by C-G formula based on SCr of 0.76 mg/dL). Liver Function Tests: Recent Labs  Lab 01/14/23 0102 01/15/23 0554 01/16/23 0815 01/17/23 0540 01/18/23 0538  AST 199* 252* 296* 309* 298*  ALT 49* 56* 65* 72* 77*  ALKPHOS 286* 292* 281* 265* 276*  BILITOT 2.4* 5.6* 5.0* 4.2* 3.5*  PROT 5.5* 5.9* 5.6* 5.6* 5.6*  ALBUMIN 2.6* 2.7* 2.6* 2.6* 2.6*    No results for input(s): "LIPASE", "AMYLASE" in the last 168 hours.  No results for input(s): "AMMONIA" in the last 168 hours. Coagulation Profile: No results for input(s): "INR", "PROTIME" in the last 168 hours.  Cardiac Enzymes: No results  for input(s): "CKTOTAL", "CKMB", "CKMBINDEX", "TROPONINI" in the last 168 hours. BNP (last 3 results) No results for input(s): "PROBNP" in the last 8760 hours. HbA1C: No results for input(s): "HGBA1C" in the last 72 hours. CBG: Recent Labs  Lab 01/18/23 1054  GLUCAP 92   Lipid Profile: No results for input(s): "CHOL", "HDL", "LDLCALC", "TRIG", "CHOLHDL", "LDLDIRECT" in the last 72 hours. Thyroid Function Tests: No results for input(s): "TSH", "T4TOTAL", "FREET4", "T3FREE", "THYROIDAB" in the last 72 hours. Anemia Panel: No results for input(s): "VITAMINB12", "FOLATE", "FERRITIN", "TIBC", "IRON", "RETICCTPCT" in the last 72 hours.  Sepsis Labs: Recent Labs  Lab 01/13/23 2154 01/15/23 0554  PROCALCITON  --  0.63  LATICACIDVEN 1.9  --      Recent Results (from the past 240 hour(s))  SARS Coronavirus 2 by RT PCR (hospital order, performed in [facility] hospital lab) *cepheid single  result test* Anterior Nasal Swab     Status: None   Collection Time: 01/11/23 10:11 AM   Specimen: Anterior Nasal Swab  Result Value Ref Range Status   SARS Coronavirus 2 by RT PCR NEGATIVE NEGATIVE Final    Comment: (NOTE) SARS-CoV-2 target nucleic acids are NOT DETECTED.  The SARS-CoV-2 RNA is generally detectable in upper and lower respiratory specimens during the acute phase of infection. The lowest concentration of SARS-CoV-2 viral copies this assay can detect is 250 copies / mL. A negative result does not preclude SARS-CoV-2 infection and should not be used as the sole basis for treatment or other patient management decisions.  A negative result may occur with improper specimen collection / handling, submission of specimen other than nasopharyngeal swab, presence of viral mutation(s) within the areas targeted by this assay, and inadequate number of viral copies (<250 copies / mL). A negative result must be combined with clinical observations, patient history, and epidemiological information.  Fact Sheet for Patients:   RoadLapTop.co.za  Fact Sheet for Healthcare Providers: http://kim-miller.com/  This test is not yet approved or  cleared by the [place] FDA and has been authorized for detection and/or diagnosis of SARS-CoV-2 by FDA under an Emergency Use Authorization (EUA).  This EUA will remain in effect (meaning this test can be used) for the duration of the COVID-19 declaration under Section 564(b)(1) of the Act, 21 U.S.C. section 360bbb-3(b)(1), unless the authorization is terminated or revoked sooner.  Performed at [facility], [address] [place], [place] [address]   MRSA Next Gen by PCR, Nasal     Status: None   Collection Time: 01/15/23  3:46 PM   Specimen: Nasal Mucosa; Nasal Swab  Result Value Ref Range Status   MRSA by PCR Next Gen NOT DETECTED NOT DETECTED Final    Comment:  (NOTE) The GeneXpert MRSA Assay (FDA approved for NASAL specimens only), is one component of a comprehensive MRSA colonization surveillance program. It is not intended to diagnose MRSA infection nor to guide or monitor treatment for MRSA infections. Test performance is not FDA approved in patients less than 16 years old. Performed at [facility], [address], [place], [place] [address]          Radiology Studies: No results found.      Scheduled Meds:  famotidine  10 mg Oral BID   feeding supplement  1 Container Oral TID BM   gabapentin  300 mg Oral QHS   guaiFENesin  1,200 mg Oral BID   ipratropium  0.5 mg Nebulization BID   levalbuterol  0.63 mg  Nebulization BID   venlafaxine XR  37.5 mg Oral Daily   Continuous Infusions:     LOS: 6 days    Time spent: 35 minutes    Lashawndra Lampkins A Anaisha Mago, MD [facility]   If 7PM-7AM, please contact night-coverage [email or web address]  01/18/2023, 3:40 PM

## 2023-01-18 NOTE — Progress Notes (Signed)
NAME:  Theresa [name], MRN:  [id number], DOB:  [birth year], LOS: 6 ADMISSION DATE:  01/11/2023 CONSULTATION DATE:  01/15/2023 REFERRING MD:  Sunnie Nielsen - TRH, CHIEF COMPLAINT:  SOB/dyspnea  History of Present Illness:  Ms. Zeier is seen in consultation at the request of Dr. Sunnie Nielsen [facility]) for recommendations on further evaluation and management of SOB/dyspnea.  67 year old woman who presented to [facility] ED 5/13 for SOB/hypoxia. PMHx significant for HTN, vertigo, breast CA (IDC of R breast, metastatic to bone/liver) s/p lumpectomy and XRT (12/2019) as well as tamoxifen. CT Chest obtained 09/19/2022 demonstrated "patchy tree-in-bud nodularities, c/w chronic inflammation vs atypical infection such as MAC). Subsequent PET 10/07/2022 did not demonstrate findings of pulmonary metastatic disease. She has been on Verzenio (with letrozole) since 10/21/2022. Recently underwent RFA/kyphoplasty for T9 pathologic compression fracture r/t osseous metastatic disease 12/10/2022. Rivka Barbara was added to treatment regimen 12/16/2022.  On chart review, patient called 5/3 with concern for sinus drainage, back pain and nausea r/t mucous. Had tried Mucinex without relief and was given recommendations for supportive care. Patient called back 5/13 with SOB/dyspnea, ?cyanosis and SpO2 72% on home pulse oximeter. Subsequently presented to [facility] ED with SOB/DOE, productive cough and hypoxia. On ED presentation, she was afebrile, tachycardic to 102, normotensive with BP 139/74, RR 16, SpO2 88%. CXR was negative for acute process; CTA Chest negative for PE, mild patchy GGOs bilaterally. Admitted to [facility] for further care.  PCCM consulted for persistent SOB/hypoxia.  Pertinent Medical History:   Past Medical History:  Diagnosis Date   Breast cancer, right (HCC)    Contact lens/glasses fitting    wears contacts or glasses   Diarrhea    --post gallbladder surgery   Family history of bone cancer    Family history of brain cancer     Family history of stomach cancer    Family history of uterine cancer    Fibroids    History of radiation therapy    Hypertension    No pertinent past medical history    Personal history of radiation therapy    PONV (postoperative nausea and vomiting)    Right ovarian cyst    Vertigo    Significant Hospital Events: Including procedures, antibiotic start and stop dates in addition to other pertinent events   5/13 - Presented to [facility] ED for SOB/dyspnea, hypoxia to 70s. Subsequently admitted to [facility] CXR unremarkable. CTA Chest negative for PE, +mild patchy GGOs bilaterally. 5/17 - PCCM consulted for persistent SOB/hypoxia. 5/18 echo with bubble study -right-to-left intrapulmonary shunt, dilated RV, RVSP 72  Interim History / Subjective:   Diuresed 1.3 L with Lasix yesterday. Breathing better.  Able to lie supine. On 2 L of oxygen  Objective:  Blood pressure 126/68, pulse 88, temperature 98.5 F (36.9 C), temperature source Oral, resp. rate 18, height 5\' 5"  (1.651 m), weight 66.4 kg, last menstrual period 09/01/2007, SpO2 94 %.        Intake/Output Summary (Last 24 hours) at 01/18/2023 1018 Last data filed at 01/18/2023 0600 Gross per 24 hour  Intake 820 ml  Output 1150 ml  Net -330 ml    Filed Weights   01/11/23 0951 01/11/23 1312  Weight: 63.5 kg 66.4 kg   Physical Examination: General: Acutely ill-appearing  woman in NAD. HEENT: [place]/AT, anicteric sclera, PERRL, moist mucous membranes. Neuro: Awake, interactive, nonfocal CV: RRR, no m/g/r. PULM: No accessory muscle use, clear breath sounds bilateral GI: Soft, nontender, nondistended. Extremities: Trace bilateral symmetric LE edema noted. Skin: Warm/dry, no  rashes.  Labs show hyponatremia 131, BUN/creatinine 29/0.7, elevated LFTs and bilirubin, stable anemia, and severe thrombocytopenia BNP 446 on 5/17  Resolved Hospital Problem List:    Assessment & Plan:    67 year old woman who presented to [facility] ED  5/13 for SOB/hypoxia. PMHx significant for HTN, vertigo, breast CA (IDC of R breast, metastatic to bone/liver) s/p lumpectomy and XRT (12/2019) as well as tamoxifen. Patient has had persistent SOB since kyphoplasty, compounded by recent severe URI ~5/3. COVID was negative on admission. It is possible (given CXR findings of mild interstitial edema/infiltrate patchy GGOs on CT) that patient has a post-viral pneumonitis or perhaps residual inflammation/edema.  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  This is new compared to echo in 2016.  Differential includes portal pulmonary hypertension or WHO 3.  No evidence of pulmonary emboli  Acute hypoxemic respiratory Lear Severe pulm hypertension - Continue supplemental O2 support - Wean O2 for sat > 90%, reassess oxygen needs on discharge -Agree with gentle diuresis   Metastatic breast cancer to liver and bone Anemia and thrombocytopenia -On Enhertu and RT, transfusions being directed by oncology  PCCM will standby,-She will need pulmonary office follow-up for further follow-up of pulmonary hypertension and oxygen needs   Best Practice: (right click and "Reselect all SmartList Selections" daily)   Per Primary Team  Labs:  CBC: Recent Labs  Lab 01/14/23 0102 01/15/23 0554 01/16/23 0815 01/17/23 0540 01/18/23 0538  WBC 4.9 7.3 7.7 8.4 6.4  NEUTROABS  --   --   --   --  5.1  HGB 7.8* 8.4* 7.8* 9.2* 9.1*  HCT 24.0* 25.6* 23.7* 28.5* 27.8*  MCV 102.1* 101.6* 101.7* 98.6 97.5  PLT 50* 25* 15* 24* 20*    Basic Metabolic Panel: Recent Labs  Lab 01/14/23 0102 01/15/23 0554 01/16/23 0815 01/17/23 0540 01/18/23 0538  NA 128* 132* 132* 132* 131*  K 3.6 4.2 4.2 4.4 4.9  CL 101 103 102 101 102  CO2 20* 20* 21* 20* 22  GLUCOSE 125* 109* 143* 131* 108*  BUN 13 14 22  30* 29*  CREATININE 0.70 0.69 0.61 0.88 0.76  CALCIUM 7.9* 8.2* 7.9* 7.7* 7.7*    GFR: Estimated Creatinine Clearance: 62.2 mL/min  (by C-G formula based on SCr of 0.76 mg/dL). Recent Labs  Lab 01/13/23 2154 01/14/23 0102 01/15/23 0554 01/16/23 0815 01/17/23 0540 01/18/23 0538  PROCALCITON  --   --  0.63  --   --   --   WBC  --    < > 7.3 7.7 8.4 6.4  LATICACIDVEN 1.9  --   --   --   --   --    < > = values in this interval not displayed.    Liver Function Tests: Recent Labs  Lab 01/14/23 0102 01/15/23 0554 01/16/23 0815 01/17/23 0540 01/18/23 0538  AST 199* 252* 296* 309* 298*  ALT 49* 56* 65* 72* 77*  ALKPHOS 286* 292* 281* 265* 276*  BILITOT 2.4* 5.6* 5.0* 4.2* 3.5*  PROT 5.5* 5.9* 5.6* 5.6* 5.6*  ALBUMIN 2.6* 2.7* 2.6* 2.6* 2.6*    No results for input(s): "LIPASE", "AMYLASE" in the last 168 hours.  No results for input(s): "AMMONIA" in the last 168 hours.  ABG:    Component Value Date/Time   HCO3 20.1 01/11/2023 1015   TCO2 21 (L) 01/11/2023 1015   ACIDBASEDEF 4.0 (H) 01/11/2023 1015   O2SAT 54 01/11/2023 1015    Coagulation Profile: Recent Labs  Lab 01/11/23 1114  INR 1.4*    Cardiac Enzymes: No results for input(s): "CKTOTAL", "CKMB", "CKMBINDEX", "TROPONINI" in the last 168 hours.  HbA1C: Hgb A1c MFr Bld  Date/Time Value Ref Range Status  01/12/2020 01:31 PM 5.6 4.8 - 5.6 % Final    Comment:             Prediabetes: 5.7 - 6.4          Diabetes: >6.4          Glycemic control for adults with diabetes: <7.0    CBG: No results for input(s): "GLUCAP" in the last 168 hours.    Signature:   Comer Locket Vassie Loll, MD [facility] 01/18/23 10:18 AM  Please see [email or web address] for pager details.  From 7A-7P if no response, please call [phone] After hours, please call ELink [phone]

## 2023-01-18 NOTE — Progress Notes (Signed)
PT Cancellation Note  Patient Details Name: Theresa [name] MRN: [id number] DOB: [birth year]   Cancelled Treatment:    Reason Eval/Treat Not Completed: Patient at procedure or test/unavailable (pt about to leave for radiation, spouse requested PT attempt later this afternoon.)  Tamala Ser PT 01/18/2023  Acute Rehabilitation Services  Office [phone]

## 2023-01-19 ENCOUNTER — Ambulatory Visit: Payer: Medicare HMO

## 2023-01-19 ENCOUNTER — Other Ambulatory Visit: Payer: Self-pay

## 2023-01-19 ENCOUNTER — Ambulatory Visit
Admission: RE | Admit: 2023-01-19 | Discharge: 2023-01-19 | Disposition: A | Discharge status: Home / Self Care | Payer: Medicare HMO | Source: Ambulatory Visit | Attending: Radiation Oncology | Admitting: Radiation Oncology

## 2023-01-19 DIAGNOSIS — I2729 Other secondary pulmonary hypertension: Secondary | ICD-10-CM | POA: Diagnosis not present

## 2023-01-19 DIAGNOSIS — J9601 Acute respiratory failure with hypoxia: Secondary | ICD-10-CM | POA: Diagnosis not present

## 2023-01-19 LAB — CBC
HCT: 27.5 % — ABNORMAL LOW (ref 36.0–46.0)
Hemoglobin: 8.8 g/dL — ABNORMAL LOW (ref 12.0–15.0)
MCH: 31.7 pg (ref 26.0–34.0)
MCHC: 32 g/dL (ref 30.0–36.0)
MCV: 98.9 fL (ref 80.0–100.0)
Platelets: 37 10*3/uL — ABNORMAL LOW (ref 150–400)
RBC: 2.78 MIL/uL — ABNORMAL LOW (ref 3.87–5.11)
RDW: 22.2 % — ABNORMAL HIGH (ref 11.5–15.5)
WBC: 3.7 10*3/uL — ABNORMAL LOW (ref 4.0–10.5)
nRBC: 1.4 % — ABNORMAL HIGH (ref 0.0–0.2)

## 2023-01-19 LAB — RAD ONC ARIA SESSION SUMMARY
Course Elapsed Days: 13
Plan Fractions Treated to Date: 9
Plan Prescribed Dose Per Fraction: 3 Gy
Plan Total Fractions Prescribed: 10
Plan Total Prescribed Dose: 30 Gy
Reference Point Dosage Given to Date: 27 Gy
Reference Point Session Dosage Given: 3 Gy
Session Number: 9

## 2023-01-19 LAB — PREPARE PLATELET PHERESIS
Unit division: 0
Unit division: 0

## 2023-01-19 LAB — TRANSFUSION REACTION
DAT C3: NEGATIVE
Post RXN DAT IgG: NEGATIVE

## 2023-01-19 LAB — BPAM PLATELET PHERESIS
Blood Product Expiration Date: 202405212359
ISSUE DATE / TIME: 202405201013
Unit Type and Rh: 600
Unit Type and Rh: 600

## 2023-01-19 LAB — COMPREHENSIVE METABOLIC PANEL
ALT: 78 U/L — ABNORMAL HIGH (ref 0–44)
AST: 273 U/L — ABNORMAL HIGH (ref 15–41)
Albumin: 2.6 g/dL — ABNORMAL LOW (ref 3.5–5.0)
Alkaline Phosphatase: 303 U/L — ABNORMAL HIGH (ref 38–126)
Anion gap: 6 (ref 5–15)
BUN: 24 mg/dL — ABNORMAL HIGH (ref 8–23)
CO2: 23 mmol/L (ref 22–32)
Calcium: 7.5 mg/dL — ABNORMAL LOW (ref 8.9–10.3)
Chloride: 99 mmol/L (ref 98–111)
Creatinine, Ser: 0.68 mg/dL (ref 0.44–1.00)
GFR, Estimated: >60 mL/min (ref >60)
Glucose, Bld: 111 mg/dL — ABNORMAL HIGH (ref 70–99)
Potassium: 4.5 mmol/L (ref 3.5–5.1)
Sodium: 128 mmol/L — ABNORMAL LOW (ref 135–145)
Total Bilirubin: 3.4 mg/dL — ABNORMAL HIGH (ref 0.3–1.2)
Total Protein: 5.5 g/dL — ABNORMAL LOW (ref 6.5–8.1)

## 2023-01-19 MED ORDER — CHLORHEXIDINE GLUCONATE CLOTH 2 % EX PADS
6.0000 | MEDICATED_PAD | Freq: Every day | CUTANEOUS | Status: DC
  Administered 2023-01-19 – 2023-01-21 (×3): 6 via TOPICAL

## 2023-01-19 MED ORDER — MAGIC MOUTHWASH W/LIDOCAINE: 5.0000 mL | Freq: Three times a day (TID) | ORAL | Status: DC | PRN

## 2023-01-19 MED ORDER — SODIUM CHLORIDE 1 G PO TABS
1.0000 g | ORAL_TABLET | Freq: Three times a day (TID) | ORAL | Status: DC
  Administered 2023-01-19 – 2023-01-21 (×7): 1 g via ORAL
  Filled 2023-01-19 (×6): qty 1

## 2023-01-19 NOTE — Progress Notes (Signed)
PROGRESS NOTE    Theresa [name]  ZOX:[id number] DOB: 05/09/1956 DOA: 01/11/2023 PCP: Carylon Perches, MD   Brief Narrative: 67 year old past medical history significant for breast cancer with metastasis to liver and spine on chemotherapy and radiation therapy presented to the ED with shortness of breath, fatigue, productive cough.  CT was negative for PE, showed groundglass opacity.  Patient was a started on IV antibiotics.  Patient developed worsening thrombocytopenia during hospitalization.  She continued to require oxygen supplementation.  She developed worsening hyperbilirubinemia and transaminases.  MRCP: Extensive hepatic metastatic disease in the right hepatic lobe and a smaller metastasis in the left hepatic lobe.  Overall volume appears to be enlarged by metastasis in comparison to previous PET scan.  No evidence of biliary duct dilation, no intrahepatic or extrahepatic biliary duct dilation.  In regards to acute on chronic respiratory failure, continue to remain on 3 L of oxygen, Treated with IV antibiotics.  Pulmonologist was consulted.  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  Continue Lasix as needed.  She will continue radiation treatment 5/22.  Plan to discharge home 5/22,  if hyponatremia improve and if she is medical stable.     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  -Patient presented with shortness of breath, cough, CT angio show groundglass opacities suspicious for multifocal pneumonia. -Patient has  completed 5 days of azithromycin 5/17.  Continue with IV ceftriaxone. Completed 7 days of Ceftriaxone.  -There was some concern about lymphangitic spread of the tumor by oncology. -Continue with schedule guaifenesin and nebulizer -BNP 400 range, echo normal Ef, pulmonary shunt,  severely elevated pulmonary pressure.  -MRSA PCR negative.  -She received low dose IV steroid 5/17 -Oxygen sat 94 % on 3 L.  -Received IV lasix 5/19, 5/20. She might need low dose lasix at discharge PRN.   2-Right side breast Ca, metastatic diseases to liver and spine.  -S/p lumpectomy.  She was on letrozole and Verzenio.  Currently on hold. -Undergoing radiation treatments.  -Dr. Georgiann Mohs following and appreciate assistance  3-Severe thrombocytopenia: -Monitor for bleeding. Could be in the setting of infection, side effect of chemotherapy, metastatic disease. -She received 2 units of platelets on 5/15.  Platelet increased to 50--- now down to 25 -She will need platelets transfusion if she requires any procedure -Received Platelet transfusion 5/18.  Platelet down to 20. Plan to give 2 units in anticipation of port placement today.   4-Hyperbilirubinemia , transaminases.  -5/17: Worsening LFT Bilirubin increase to 5.  -MRCP; negative for biliary or extra hepatic biliary  obstruction.  Extensive hepatic metastasis in the RIGHT hepatic lobe and smaller metastasis in the LEFT hepatic lobe. Overall volume of the liver is enlarged by metastasis and mildly increased from comparison PET-CT scan in February.  Dr Pamelia Hoit, recommend starting Chemo. Received Chemo 5/17 lower dose.  Bilirubin down to  3.5  5-Hyponatremia:  Improved with sodium tablet. Sodium table held, patient was getting IV lasix.  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  Resume Sodium Tablet 5/21. Repeat labs in am.  6-Macrocytic anemia, in the setting of malignancy.  She received 1 unit of packed red blood cell 5/15, transfusion had to be stopped because of concern of  transfusion reaction with fever. Received one unit PRBC 5/18 Hb at 8.8  7-Debility/deconditioning: She will need home health PT  8-goals of care: Patient with poor prognosis due to metastatic disease.   T10 vertebra compression fracture: Continue with supportive care pain management.  -Status post kyphoplasty for T9 compression fracture.   Nutrition Problem: Increased nutrient needs Etiology: cancer and cancer related treatments    Signs/Symptoms: estimated needs    Interventions: Ensure Enlive (each supplement provides 350kcal and 20 grams of protein)  Estimated body mass index is 24.36 kg/m as calculated from the following:   Height as of this encounter: 5\' 5"  (1.651 m).   Weight as of this encounter: 66.4 kg.   DVT prophylaxis: SCD Code Status: Full code Family Communication: Husband at bedside.  Disposition Plan:  Status is: Inpatient Remains inpatient appropriate because: management, resp failure    Consultants:  Pulmonologist Dr Pamelia Hoit  Procedures:    Antimicrobials:    Subjective: She is more awake, appears very weak, and tired.  She has been eating more. She had BM.  She just came from radiation treatment.   Objective: Vitals:   01/19/23 0531 01/19/23 0914 01/19/23 1339 01/19/23 1418  BP: 116/62  120/64   Pulse: 86  96   Resp: 14  18   Temp: 97.7 F (36.5 C)  (!) 97.4 F (36.3 C)   TempSrc: Oral  Oral   SpO2: 95% 97% 92% (!) 83%  Weight:      Height:        Intake/Output Summary (Last 24 hours) at 01/19/2023 1449 Last data filed at 01/19/2023 1408 Gross per 24 hour  Intake 360 ml  Output 700 ml  Net -340 ml    Filed Weights   01/11/23 0951 01/11/23 1312  Weight: 63.5 kg 66.4 kg    Examination:  General exam: Alert, chronic ill appearing Respiratory system: BL air movement.  Cardiovascular system: S 1., S 2 RRR Gastrointestinal system: BS present, soft, nt Central nervous system: Alert, follows command Extremities: No  edema   Data Reviewed: I have personally reviewed following labs and imaging studies  CBC: Recent Labs  Lab 01/15/23 0554 01/16/23 0815 01/17/23 0540 01/18/23 0538 01/19/23 1130  WBC 7.3 7.7 8.4 6.4 3.7*  NEUTROABS  --   --   --  5.1  --   HGB 8.4* 7.8* 9.2* 9.1* 8.8*  HCT 25.6* 23.7* 28.5* 27.8* 27.5*  MCV 101.6* 101.7* 98.6 97.5 98.9  PLT 25* 15* 24* 20* 37*    Basic Metabolic Panel: Recent Labs  Lab 01/15/23 0554 01/16/23 0815 01/17/23 0540 01/18/23 0538 01/19/23 1130  NA 132* 132* 132* 131* 128*  K 4.2 4.2 4.4 4.9 4.5  CL 103 102 101 102 99  CO2 20* 21* 20* 22 23  GLUCOSE 109* 143* 131* 108* 111*  BUN 14 22 30* 29* 24*  CREATININE 0.69 0.61 0.88 0.76 0.68  CALCIUM 8.2* 7.9* 7.7* 7.7* 7.5*    GFR: Estimated Creatinine Clearance: 62.2 mL/min (by C-G formula based on SCr of 0.68 mg/dL). Liver Function Tests: Recent Labs  Lab 01/15/23 0554 01/16/23 0815 01/17/23 0540 01/18/23 0538 01/19/23 1130  AST 252* 296* 309* 298* 273*  ALT 56* 65* 72* 77* 78*  ALKPHOS 292* 281* 265* 276* 303*  BILITOT 5.6* 5.0* 4.2* 3.5* 3.4*  PROT 5.9* 5.6*  5.6* 5.6* 5.5*  ALBUMIN 2.7* 2.6* 2.6* 2.6* 2.6*    No results for input(s): "LIPASE", "AMYLASE" in the last 168 hours.  No results for input(s): "AMMONIA" in the last 168 hours. Coagulation Profile: No results for input(s): "INR", "PROTIME" in the last 168 hours.  Cardiac Enzymes: No results for input(s): "CKTOTAL", "CKMB", "CKMBINDEX", "TROPONINI" in the last 168 hours. BNP (last 3 results) No results for input(s): "PROBNP" in the last 8760 hours. HbA1C: No results for input(s): "HGBA1C" in the last 72 hours. CBG: Recent Labs  Lab 01/18/23 1054  GLUCAP 92    Lipid Profile: No results for input(s): "CHOL", "HDL", "LDLCALC", "TRIG", "CHOLHDL", "LDLDIRECT" in the last 72 hours. Thyroid Function Tests: No results for input(s): "TSH", "T4TOTAL", "FREET4", "T3FREE", "THYROIDAB" in the last 72 hours. Anemia  Panel: No results for input(s): "VITAMINB12", "FOLATE", "FERRITIN", "TIBC", "IRON", "RETICCTPCT" in the last 72 hours.  Sepsis Labs: Recent Labs  Lab 01/13/23 2154 01/15/23 0554  PROCALCITON  --  0.63  LATICACIDVEN 1.9  --      Recent Results (from the past 240 hour(s))  SARS Coronavirus 2 by RT PCR (hospital order, performed in [facility] hospital lab) *cepheid single result test* Anterior Nasal Swab     Status: None   Collection Time: 01/11/23 10:11 AM   Specimen: Anterior Nasal Swab  Result Value Ref Range Status   SARS Coronavirus 2 by RT PCR NEGATIVE NEGATIVE Final    Comment: (NOTE) SARS-CoV-2 target nucleic acids are NOT DETECTED.  The SARS-CoV-2 RNA is generally detectable in upper and lower respiratory specimens during the acute phase of infection. The lowest concentration of SARS-CoV-2 viral copies this assay can detect is 250 copies / mL. A negative result does not preclude SARS-CoV-2 infection and should not be used as the sole basis for treatment or other patient management decisions.  A negative result may occur with improper specimen collection / handling, submission of specimen other than nasopharyngeal swab, presence of viral mutation(s) within the areas targeted by this assay, and inadequate number of viral copies (<250 copies / mL). A negative result must be combined with clinical observations, patient history, and epidemiological information.  Fact Sheet for Patients:   RoadLapTop.co.za  Fact Sheet for Healthcare Providers: http://kim-miller.com/  This test is not yet approved or  cleared by the [place] FDA and has been authorized for detection and/or diagnosis of SARS-CoV-2 by FDA under an Emergency Use Authorization (EUA).  This EUA will remain in effect (meaning this test can be used) for the duration of the COVID-19 declaration under Section 564(b)(1) of the Act, 21 U.S.C. section 360bbb-3(b)(1),  unless the authorization is terminated or revoked sooner.  Performed at [facility], [address], [place], [place] [address]   MRSA Next Gen by PCR, Nasal     Status: None   Collection Time: 01/15/23  3:46 PM   Specimen: Nasal Mucosa; Nasal Swab  Result Value Ref Range Status   MRSA by PCR Next Gen NOT DETECTED NOT DETECTED Final    Comment: (NOTE) The GeneXpert MRSA Assay (FDA approved for NASAL specimens only), is one component of a comprehensive MRSA colonization surveillance program. It is not intended to diagnose MRSA infection nor to guide or monitor treatment for MRSA infections. Test performance is not FDA approved in patients less than 21 years old. Performed at [facility], [address] [place], [place] [address]          Radiology Studies: IR IMAGING GUIDED  PORT INSERTION  Result Date: 01/18/2023 INDICATION: Chemotherapy.  History of metastatic breast cancer. EXAM: IMPLANTED PORT A CATH PLACEMENT WITH ULTRASOUND AND FLUOROSCOPIC GUIDANCE MEDICATIONS: None ANESTHESIA/SEDATION: Moderate (conscious) sedation was employed during this procedure. A total of Versed 2 mg and Fentanyl 100 mcg was administered intravenously. Moderate Sedation Time: 25 minutes. The patient's level of consciousness and vital signs were monitored continuously by radiology nursing throughout the procedure under my direct supervision. FLUOROSCOPY TIME:  Fluoroscopic dose; 0 mGy COMPLICATIONS: None immediate. PROCEDURE: The procedure, risks, benefits, and alternatives were explained to the patient and/or patient's representative . Questions regarding the procedure were encouraged and answered. The patient and/or patient's representative understands and consents to the procedure. The RIGHT neck and chest were prepped with chlorhexidine in a sterile fashion, and a sterile drape was applied covering the operative field. Maximum barrier sterile technique with sterile  gowns and gloves were used for the procedure. A timeout was performed prior to the initiation of the procedure. Local anesthesia was provided with 1% lidocaine with epinephrine. After creating a small venotomy incision, a micropuncture kit was utilized to access the internal jugular vein under direct, real-time ultrasound guidance. Ultrasound image documentation was performed. The microwire was kinked to measure appropriate catheter length. A subcutaneous port pocket was then created along the upper chest wall utilizing a combination of sharp and blunt dissection. The pocket was irrigated with sterile saline. A single lumen ISP power injectable port was chosen for placement. The 8 Fr catheter was tunneled from the port pocket site to the venotomy incision. The port was placed in the pocket. The external catheter was trimmed to appropriate length. At the venotomy, an 8 Fr peel-away sheath was placed over a guidewire under fluoroscopic guidance. The catheter was then placed through the sheath and the sheath was removed. Final catheter positioning was confirmed and documented with a fluoroscopic spot radiograph. The port was accessed with a Huber needle, aspirated and flushed with heparinized saline. The port pocket incision was closed with interrupted 3-0 Vicryl suture then Dermabond was applied, including at the venotomy incision. Dressings were placed. The patient tolerated the procedure well without immediate post procedural complication. IMPRESSION: Successful placement of a RIGHT internal jugular approach power injectable Port-A-Cath. The tip of the catheter is positioned within the proximal RIGHT atrium. The catheter is ready for immediate use. Roanna Banning, MD [facility] Radiology Electronically Signed   By: Roanna Banning M.D.   On: 01/18/2023 16:58        Scheduled Meds:  Chlorhexidine Gluconate Cloth  6 each Topical Daily   famotidine  10 mg Oral BID    feeding supplement  1 Container Oral TID BM   gabapentin  300 mg Oral QHS   guaiFENesin  1,200 mg Oral BID   ipratropium  0.5 mg Nebulization BID   levalbuterol  0.63 mg Nebulization BID   sodium chloride  1 g Oral TID WC   venlafaxine XR  37.5 mg Oral Daily   Continuous Infusions:     LOS: 7 days    Time spent: 35 minutes    Nicolette Gieske A Kimyetta Flott, MD [facility]   If 7PM-7AM, please contact night-coverage [email or web address]  01/19/2023, 2:49 PM

## 2023-01-19 NOTE — Progress Notes (Signed)
Nutrition Follow-up  INTERVENTION:   -Boost Breeze po TID, each supplement provides 250 kcal and 9 grams of protein   NUTRITION DIAGNOSIS:   Increased nutrient needs related to cancer and cancer related treatments as evidenced by estimated needs.  Ongoing.  GOAL:   Patient will meet greater than or equal to 90% of their needs  Progressing.  MONITOR:   PO intake, Supplement acceptance, Labs, Weight trends, I & O's  ASSESSMENT:   67 y.o. female with medical history significant for breast cancer status post lumpectomy, chemotherapy, radiation with known liver and spinal metastases being admitted to the hospital with atypical pneumonia, new T10 pathologic compression fracture and worsening LFTs concerning for worsening metastatic liver lesions.  Patient currently consuming 50% of meals. Not accepting Boost Breeze supplements. Ensure was d/c 5/17.   Admission weight: 146 lbs No other weights measured this admission.  Medications: Pepcid  Labs reviewed: CBGs 92 Low Na   Diet Order:   Diet Order             Diet regular Room service appropriate? Yes; Fluid consistency: Thin  Diet effective now                   EDUCATION NEEDS:   No education needs have been identified at this time  Skin:  Skin Assessment: Reviewed RN Assessment  Last BM:  5/20 -type 6  Height:   Ht Readings from Last 1 Encounters:  01/11/23 5\' 5"  (1.651 m)    Weight:   Wt Readings from Last 1 Encounters:  01/11/23 66.4 kg    BMI:  Body mass index is 24.36 kg/m.  Estimated Nutritional Needs:   Kcal:  1800-2000  Protein:  90-100g  Fluid:  2L/day  Tilda Franco, MS, RD, LDN Inpatient Clinical Dietitian Contact information available via Amion

## 2023-01-19 NOTE — Progress Notes (Signed)
Mobility Specialist - Progress Note   01/19/23 1418  Oxygen Therapy  SpO2 (!) 83 %  O2 Device Nasal Cannula  O2 Flow Rate (L/min) 2 L/min  Patient Activity (if Appropriate) Ambulating  Mobility  Activity Ambulated with assistance in hallway  Level of Assistance Standby assist, set-up cues, supervision of patient - no hands on  Assistive Device Front wheel walker  Distance Ambulated (ft) 160 ft  Activity Response Tolerated fair  Mobility Referral Yes  $Mobility charge 1 Mobility  Mobility Specialist Start Time (ACUTE ONLY) 0200  Mobility Specialist Stop Time (ACUTE ONLY) 0216  Mobility Specialist Time Calculation (min) (ACUTE ONLY) 16 min   Pt received in bed and agreeable to mobility. Pt desat to 83% while ambulating. Encouraged a standing rest break (~14min) with pursed lip breaths allowing O2 to come back up to 88%. Pt weak throughout session. Upon returning to room pt desat to 87%. Encouraged pursed lip breaths allowing O2 to come back up to 97%. No complaints during session. Pt to recliner after session with all needs met.   Pre-mobility:  91% SpO2 (2L [place]) During mobility: 83-88% SpO2 (2L [place]) Post-mobility: 89 HR, 98% SPO2 (2L [place])  Chief Technology Officer

## 2023-01-19 NOTE — TOC Progression Note (Signed)
Transition of Care [facility]) - Progression Note    Patient Details  Name: BARBETTA FERONE MRN: [id number] Date of Birth: [birth year]  Transition of Care [facility]) CM/SW Contact  Beckie Busing, RN Phone [phone]  01/19/2023, 3:10 PM  Clinical Narrative:    DME nebulizer machine has been ordered through [employer]. To be delivered to the bedside prior to d/c.    Expected Discharge Plan: Home w Home Health Services Barriers to Discharge: Continued Medical Work up  Expected Discharge Plan and Services In-house Referral: NA Discharge Planning Services: CM Consult Post Acute Care Choice: Home Health Living arrangements for the past 2 months: Single Family Home                 DME Arranged: Oxygen DME Agency: [employer] Date DME Agency Contacted: 01/14/23 Time DME Agency Contacted: 1539 Representative spoke with at DME Agency: Shaune Leeks HH Arranged: PT HH Agency: [facility] Date [facility] Agency Contacted: 01/14/23 Time HH Agency Contacted: 1539 Representative spoke with at [facility] Agency: [place] [facility]   Social Determinants of Health (SDOH) Interventions SDOH Screenings   Food Insecurity: No Food Insecurity (01/11/2023)  Housing: Low Risk  (01/11/2023)  Transportation Needs: No Transportation Needs (01/11/2023)  Utilities: Not At Risk (01/11/2023)  Depression (PHQ2-9): Low Risk  (12/18/2022)  Tobacco Use: Medium Risk (01/18/2023)    Readmission Risk Interventions    01/19/2023    2:47 PM  Readmission Risk Prevention Plan  Transportation Screening Complete  PCP or Specialist Appt within 3-5 Days Complete  HRI or Home Care Consult Complete  Social Work Consult for Recovery Care Planning/Counseling Complete  Palliative Care Screening Complete  Medication Review Oceanographer) Complete

## 2023-01-20 ENCOUNTER — Telehealth: Payer: Self-pay | Admitting: Pulmonary Disease

## 2023-01-20 ENCOUNTER — Ambulatory Visit
Admission: RE | Admit: 2023-01-20 | Discharge: 2023-01-20 | Disposition: A | Discharge status: Home / Self Care | Payer: Medicare HMO | Source: Ambulatory Visit | Attending: Radiation Oncology | Admitting: Radiation Oncology

## 2023-01-20 ENCOUNTER — Other Ambulatory Visit: Payer: Self-pay

## 2023-01-20 DIAGNOSIS — J9601 Acute respiratory failure with hypoxia: Secondary | ICD-10-CM | POA: Diagnosis not present

## 2023-01-20 LAB — CBC WITH DIFFERENTIAL/PLATELET
Abs Immature Granulocytes: 0.08 10*3/uL — ABNORMAL HIGH (ref 0.00–0.07)
Basophils Absolute: 0 10*3/uL (ref 0.0–0.1)
Basophils Relative: 1 %
Eosinophils Absolute: 0 10*3/uL (ref 0.0–0.5)
Eosinophils Relative: 1 %
HCT: 29.5 % — ABNORMAL LOW (ref 36.0–46.0)
Hemoglobin: 9.8 g/dL — ABNORMAL LOW (ref 12.0–15.0)
Immature Granulocytes: 2 %
Lymphocytes Relative: 23 %
Lymphs Abs: 1 10*3/uL (ref 0.7–4.0)
MCH: 32.3 pg (ref 26.0–34.0)
MCHC: 33.2 g/dL (ref 30.0–36.0)
MCV: 97.4 fL (ref 80.0–100.0)
Monocytes Absolute: 0.3 10*3/uL (ref 0.1–1.0)
Monocytes Relative: 7 %
Neutro Abs: 2.8 10*3/uL (ref 1.7–7.7)
Neutrophils Relative %: 66 %
Platelets: 31 10*3/uL — ABNORMAL LOW (ref 150–400)
RBC: 3.03 MIL/uL — ABNORMAL LOW (ref 3.87–5.11)
RDW: 21.3 % — ABNORMAL HIGH (ref 11.5–15.5)
WBC: 4.1 10*3/uL (ref 4.0–10.5)
nRBC: 1.9 % — ABNORMAL HIGH (ref 0.0–0.2)

## 2023-01-20 LAB — RAD ONC ARIA SESSION SUMMARY
Course Elapsed Days: 14
Plan Fractions Treated to Date: 10
Plan Prescribed Dose Per Fraction: 3 Gy
Plan Total Fractions Prescribed: 10
Plan Total Prescribed Dose: 30 Gy
Reference Point Dosage Given to Date: 30 Gy
Reference Point Session Dosage Given: 3 Gy
Session Number: 10

## 2023-01-20 LAB — COMPREHENSIVE METABOLIC PANEL
ALT: 82 U/L — ABNORMAL HIGH (ref 0–44)
AST: 274 U/L — ABNORMAL HIGH (ref 15–41)
Albumin: 2.8 g/dL — ABNORMAL LOW (ref 3.5–5.0)
Alkaline Phosphatase: 345 U/L — ABNORMAL HIGH (ref 38–126)
Anion gap: 8 (ref 5–15)
BUN: 18 mg/dL (ref 8–23)
CO2: 22 mmol/L (ref 22–32)
Calcium: 7.8 mg/dL — ABNORMAL LOW (ref 8.9–10.3)
Chloride: 99 mmol/L (ref 98–111)
Creatinine, Ser: 0.74 mg/dL (ref 0.44–1.00)
GFR, Estimated: >60 mL/min (ref >60)
Glucose, Bld: 115 mg/dL — ABNORMAL HIGH (ref 70–99)
Potassium: 4.8 mmol/L (ref 3.5–5.1)
Sodium: 129 mmol/L — ABNORMAL LOW (ref 135–145)
Total Bilirubin: 3.7 mg/dL — ABNORMAL HIGH (ref 0.3–1.2)
Total Protein: 6 g/dL — ABNORMAL LOW (ref 6.5–8.1)

## 2023-01-20 NOTE — Progress Notes (Signed)
Mobility Specialist - Progress Note   01/20/23 1449  Oxygen Therapy  SpO2 (!) 85 %  O2 Device Nasal Cannula  O2 Flow Rate (L/min) 2 L/min  Patient Activity (if Appropriate) Ambulating  Mobility  Activity Ambulated with assistance in hallway  Level of Assistance Standby assist, set-up cues, supervision of patient - no hands on  Assistive Device Front wheel walker  Distance Ambulated (ft) 120 ft  Activity Response Tolerated well  Mobility Referral Yes  $Mobility charge 1 Mobility  Mobility Specialist Start Time (ACUTE ONLY) 0235  Mobility Specialist Stop Time (ACUTE ONLY) 0247  Mobility Specialist Time Calculation (min) (ACUTE ONLY) 12 min   Pt received in bed and agreeable to mobility. Prior to ambulating pt O2 was on 85%. Encouraged pursed lip breaths bringing O2 up to 88%. Pt required 3x standing rest break due to fatigue. No complaints during session. When returning to room pt desat to 87% requiring ~12min of pursed lip breaths to allow o2 to come back up to 91%. Pt to bed after session with all needs met.    Pre-mobility: 112 HR, 85% SpO2 (2L [place]) During mobility: 120 HR, 88% SpO2 (2L [place]) Post-mobility: 106 HR, 87-91% SPO2 (2L [place])  Chief Technology Officer

## 2023-01-20 NOTE — Progress Notes (Signed)
PROGRESS NOTE    Theresa [name]  ZOX:[id number] DOB: 1956/04/28 DOA: 01/11/2023 PCP: Carylon Perches, MD   Brief Narrative:  This 67 year old female with past medical history significant for breast cancer with metastasis to liver and spine on chemotherapy and radiation therapy presented to the ED with shortness of breath, fatigue, productive cough.  CT was negative for PE,  but showed groundglass opacity.  Patient was started on IV antibiotics.  Patient developed worsening thrombocytopenia during hospitalization.  She continued to require oxygen supplementation. She developed worsening hyperbilirubinemia and transaminases.  MRCP showed: Extensive hepatic metastatic disease in the right hepatic lobe and a smaller metastasis in the left hepatic lobe.  Overall volume appears to be enlarged by metastasis in comparison to previous PET scan.  No evidence of biliary duct dilation, no intrahepatic or extrahepatic biliary duct dilation. In regards to acute on chronic respiratory failure, She continued to remain on 3 L of oxygen, Treated with IV antibiotics.  Pulmonologist was consulted.  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  Continue Lasix as needed. She will need outpatient pulmonology follow up.   She will continue radiation treatment on 5/22.  if hyponatremia improve and if she is medical stable anticipated dc in 1-2 days.  Assessment & Plan:   Principal Problem:   Acute hypoxic respiratory failure (HCC) Active Problems:   Cancer, metastatic to bone (HCC)   PNA (pneumonia)   Hypoxic respiratory failure (HCC)   LFT elevation   Hepatomegaly   Compression fracture of T9 vertebra (HCC)   Compression fracture of T10 vertebra (HCC)   Pneumonia  Acute hypoxic respiratory failure: Likely pulmonary shunt and pulmonary artery hypertension in the setting of liver metastatic disease: - She presented with shortness of breath, productive cough, CT angio  showed groundglass opacities suspicious for multifocal pneumonia. -Patient has completed 5 days of azithromycin and completed 7 days of ceftriaxone. -There was some concern about lymphangitic spread of the tumor by oncology. -Continue with schedule guaifenesin and nebulizer treatment. -BNP in  400 range, echo normal EF, pulmonary shunt,  severely elevated pulmonary pressure.  -MRSA PCR negative.  -She received low dose IV steroid on  5/17 - Maintaining Oxygen sat 94 % on 3 L.  - She has received IV lasix 5/19, 5/20. - She might need low dose lasix at discharge PRN.    Right-sided breast cancer,  metastatic disease to the liver and spine: -S/p lumpectomy.  She was on letrozole and Verzenio.  Currently on hold. -She is undergoing radiation treatments.  -Dr. Georgiann Mohs following and appreciate assistance.   Severe thrombocytopenia: -Monitor for bleeding. -Could be in the setting of infection, side effect of chemotherapy,  and metastatic disease. -She received 2 units of platelets on 5/15.  Platelet increased to 50--- now down to 25 -She will need platelets transfusion if she requires any procedure -She has received Platelet transfusion  on 5/18.  -Platelet count 37> 31.   Hyperbilirubinemia / Elevated liver enzymes: -5/17: Worsening LFT,  Bilirubin increased to 5.0 -MRCP; negative for biliary or extra hepatic biliary obstruction.  Extensive hepatic metastasis in the RIGHT hepatic lobe and smaller metastasis in the LEFT hepatic lobe. Overall volume of the liver is enlarged by metastasis and mildly increased from comparison PET-CT scan in February.  Dr Pamelia Hoit, recommend starting Chemo. She has received Chemo on  5/17 lower dose.  Bilirubin down to  3.5   Hyponatremia:  Improved with sodium tablet.  Sodium table  held, patient was getting IV lasix.  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  Resume Sodium Tablet 5/21. Repeat labs in am  Macrocytic anemia, in the setting of malignancy.   She received 1 unit of  packed red blood cell on 5/15, transfusion had to be stopped because of concern of transfusion reaction with fever. Received one unit PRBC 5/18 Hb at 8.8 > 9.8   Debility/deconditioning: PT recommended home with home PT.   Goals of care: Patient with poor prognosis due to metastatic disease.     T10 vertebra compression fracture:  Continue with supportive care pain management.   -Status post kyphoplasty for T9 compression fracture.     Nutrition Problem: Increased nutrient needs Etiology: cancer and cancer related treatments   DVT prophylaxis: SCDs Code Status:Full code Family Communication:Husband at bed side. Disposition Plan:    Status is: Inpatient Remains inpatient appropriate because: Admitted for acute hypoxic respiratory failure in the setting of metastatic breast cancer to the liver and spine.  Patient underwent kyphoplasty for T10 compression fracture. It still is weak. Anticipated discharge home with HHS on  5/23.     Consultants:  Pulmonologist Oncologist  Procedures: None Antimicrobials:  Anti-infectives (From admission, onward)    Start     Dose/Rate Route Frequency Ordered Stop   01/15/23 1030  cefTRIAXone (ROCEPHIN) 2 g in sodium chloride 0.9 % 100 mL IVPB  Status:  Discontinued        2 g 200 mL/hr over 30 Minutes Intravenous Every 24 hours 01/15/23 0931 01/18/23 0808   01/12/23 1000  azithromycin (ZITHROMAX) 500 mg in sodium chloride 0.9 % 250 mL IVPB  Status:  Discontinued        500 mg 250 mL/hr over 60 Minutes Intravenous Every 24 hours 01/11/23 1430 01/15/23 1221   01/12/23 1000  cefTRIAXone (ROCEPHIN) 1 g in sodium chloride 0.9 % 100 mL IVPB  Status:  Discontinued        1 g 200 mL/hr over 30 Minutes Intravenous Every 24 hours 01/11/23 1430 01/15/23 0931   01/11/23 1130  cefTRIAXone (ROCEPHIN) 1 g in sodium chloride 0.9 % 100 mL IVPB        1 g 200 mL/hr over 30 Minutes Intravenous  Once 01/11/23 1122 01/11/23 1214   01/11/23 1130   azithromycin (ZITHROMAX) 500 mg in sodium chloride 0.9 % 250 mL IVPB        500 mg 250 mL/hr over 60 Minutes Intravenous  Once 01/11/23 1122 01/11/23 1335       Subjective: Patient was seen and examined at bedside.  Overnight events noted. Patient was very weak, tired and fatigued and seems exhausted after she went to the bathroom. She denies any chest pain.  Objective: Vitals:   01/19/23 2122 01/20/23 0609 01/20/23 0908 01/20/23 0913  BP: 124/73 117/61    Pulse: 91 95    Resp: 16 16    Temp: 97.9 F (36.6 C) 98 F (36.7 C)    TempSrc: Oral Oral    SpO2: 92% 95% 98% 98%  Weight:      Height:        Intake/Output Summary (Last 24 hours) at 01/20/2023 1208 Last data filed at 01/20/2023 0930 Gross per 24 hour  Intake 480 ml  Output --  Net 480 ml   Filed Weights   01/11/23 0951 01/11/23 1312  Weight: 63.5 kg 66.4 kg    Examination:  General exam: Appears calm and comfortable, deconditioned, very weak, Respiratory system: Clear to auscultation. Respiratory effort normal.  RR 16 Cardiovascular system: S1 & S2 heard, regular rate and rhythm, no murmur. Gastrointestinal system: Abdomen is soft, nontender, non distended, BS+ Central nervous system: Alert and oriented x 3. No focal neurological deficits. Extremities: No edema, no cyanosis, no clubbing Skin: No rashes, lesions or ulcers Psychiatry: Judgement and insight appear normal. Mood & affect appropriate.     Data Reviewed: I have personally reviewed following labs and imaging studies  CBC: Recent Labs  Lab 01/16/23 0815 01/17/23 0540 01/18/23 0538 01/19/23 1130 01/20/23 0500  WBC 7.7 8.4 6.4 3.7* 4.1  NEUTROABS  --   --  5.1  --  2.8  HGB 7.8* 9.2* 9.1* 8.8* 9.8*  HCT 23.7* 28.5* 27.8* 27.5* 29.5*  MCV 101.7* 98.6 97.5 98.9 97.4  PLT 15* 24* 20* 37* 31*   Basic Metabolic Panel: Recent Labs  Lab 01/16/23 0815 01/17/23 0540 01/18/23 0538 01/19/23 1130 01/20/23 0500  NA 132* 132* 131* 128* 129*  K  4.2 4.4 4.9 4.5 4.8  CL 102 101 102 99 99  CO2 21* 20* 22 23 22   GLUCOSE 143* 131* 108* 111* 115*  BUN 22 30* 29* 24* 18  CREATININE 0.61 0.88 0.76 0.68 0.74  CALCIUM 7.9* 7.7* 7.7* 7.5* 7.8*   GFR: Estimated Creatinine Clearance: 62.2 mL/min (by C-G formula based on SCr of 0.74 mg/dL). Liver Function Tests: Recent Labs  Lab 01/16/23 0815 01/17/23 0540 01/18/23 0538 01/19/23 1130 01/20/23 0500  AST 296* 309* 298* 273* 274*  ALT 65* 72* 77* 78* 82*  ALKPHOS 281* 265* 276* 303* 345*  BILITOT 5.0* 4.2* 3.5* 3.4* 3.7*  PROT 5.6* 5.6* 5.6* 5.5* 6.0*  ALBUMIN 2.6* 2.6* 2.6* 2.6* 2.8*   No results for input(s): "LIPASE", "AMYLASE" in the last 168 hours. No results for input(s): "AMMONIA" in the last 168 hours. Coagulation Profile: No results for input(s): "INR", "PROTIME" in the last 168 hours. Cardiac Enzymes: No results for input(s): "CKTOTAL", "CKMB", "CKMBINDEX", "TROPONINI" in the last 168 hours. BNP (last 3 results) No results for input(s): "PROBNP" in the last 8760 hours. HbA1C: No results for input(s): "HGBA1C" in the last 72 hours. CBG: Recent Labs  Lab 01/18/23 1054  GLUCAP 92   Lipid Profile: No results for input(s): "CHOL", "HDL", "LDLCALC", "TRIG", "CHOLHDL", "LDLDIRECT" in the last 72 hours. Thyroid Function Tests: No results for input(s): "TSH", "T4TOTAL", "FREET4", "T3FREE", "THYROIDAB" in the last 72 hours. Anemia Panel: No results for input(s): "VITAMINB12", "FOLATE", "FERRITIN", "TIBC", "IRON", "RETICCTPCT" in the last 72 hours. Sepsis Labs: Recent Labs  Lab 01/13/23 2154 01/15/23 0554  PROCALCITON  --  0.63  LATICACIDVEN 1.9  --     Recent Results (from the past 240 hour(s))  SARS Coronavirus 2 by RT PCR (hospital order, performed in [facility] hospital lab) *cepheid single result test* Anterior Nasal Swab     Status: None   Collection Time: 01/11/23 10:11 AM   Specimen: Anterior Nasal Swab  Result Value Ref Range Status   SARS Coronavirus 2  by RT PCR NEGATIVE NEGATIVE Final    Comment: (NOTE) SARS-CoV-2 target nucleic acids are NOT DETECTED.  The SARS-CoV-2 RNA is generally detectable in upper and lower respiratory specimens during the acute phase of infection. The lowest concentration of SARS-CoV-2 viral copies this assay can detect is 250 copies / mL. A negative result does not preclude SARS-CoV-2 infection and should not be used as the sole basis for treatment or other patient management decisions.  A negative result may occur with improper specimen collection /  handling, submission of specimen other than nasopharyngeal swab, presence of viral mutation(s) within the areas targeted by this assay, and inadequate number of viral copies (<250 copies / mL). A negative result must be combined with clinical observations, patient history, and epidemiological information.  Fact Sheet for Patients:   RoadLapTop.co.za  Fact Sheet for Healthcare Providers: http://kim-miller.com/  This test is not yet approved or  cleared by the [place] FDA and has been authorized for detection and/or diagnosis of SARS-CoV-2 by FDA under an Emergency Use Authorization (EUA).  This EUA will remain in effect (meaning this test can be used) for the duration of the COVID-19 declaration under Section 564(b)(1) of the Act, 21 U.S.C. section 360bbb-3(b)(1), unless the authorization is terminated or revoked sooner.  Performed at [facility], [address], [place], [place] [address]   MRSA Next Gen by PCR, Nasal     Status: None   Collection Time: 01/15/23  3:46 PM   Specimen: Nasal Mucosa; Nasal Swab  Result Value Ref Range Status   MRSA by PCR Next Gen NOT DETECTED NOT DETECTED Final    Comment: (NOTE) The GeneXpert MRSA Assay (FDA approved for NASAL specimens only), is one component of a comprehensive MRSA colonization surveillance program. It is not intended to diagnose  MRSA infection nor to guide or monitor treatment for MRSA infections. Test performance is not FDA approved in patients less than 41 years old. Performed at [facility], [address], [place], [place] [address]    Radiology Studies: IR IMAGING GUIDED PORT INSERTION  Result Date: 01/18/2023 INDICATION: Chemotherapy.  History of metastatic breast cancer. EXAM: IMPLANTED PORT A CATH PLACEMENT WITH ULTRASOUND AND FLUOROSCOPIC GUIDANCE MEDICATIONS: None ANESTHESIA/SEDATION: Moderate (conscious) sedation was employed during this procedure. A total of Versed 2 mg and Fentanyl 100 mcg was administered intravenously. Moderate Sedation Time: 25 minutes. The patient's level of consciousness and vital signs were monitored continuously by radiology nursing throughout the procedure under my direct supervision. FLUOROSCOPY TIME:  Fluoroscopic dose; 0 mGy COMPLICATIONS: None immediate. PROCEDURE: The procedure, risks, benefits, and alternatives were explained to the patient and/or patient's representative . Questions regarding the procedure were encouraged and answered. The patient and/or patient's representative understands and consents to the procedure. The RIGHT neck and chest were prepped with chlorhexidine in a sterile fashion, and a sterile drape was applied covering the operative field. Maximum barrier sterile technique with sterile gowns and gloves were used for the procedure. A timeout was performed prior to the initiation of the procedure. Local anesthesia was provided with 1% lidocaine with epinephrine. After creating a small venotomy incision, a micropuncture kit was utilized to access the internal jugular vein under direct, real-time ultrasound guidance. Ultrasound image documentation was performed. The microwire was kinked to measure appropriate catheter length. A subcutaneous port pocket was then created along the upper chest wall utilizing a combination of sharp and blunt dissection.  The pocket was irrigated with sterile saline. A single lumen ISP power injectable port was chosen for placement. The 8 Fr catheter was tunneled from the port pocket site to the venotomy incision. The port was placed in the pocket. The external catheter was trimmed to appropriate length. At the venotomy, an 8 Fr peel-away sheath was placed over a guidewire under fluoroscopic guidance. The catheter was then placed through the sheath and the sheath was removed. Final catheter positioning was confirmed and documented with a fluoroscopic spot radiograph. The port was accessed with a Huber needle, aspirated and flushed  with heparinized saline. The port pocket incision was closed with interrupted 3-0 Vicryl suture then Dermabond was applied, including at the venotomy incision. Dressings were placed. The patient tolerated the procedure well without immediate post procedural complication. IMPRESSION: Successful placement of a RIGHT internal jugular approach power injectable Port-A-Cath. The tip of the catheter is positioned within the proximal RIGHT atrium. The catheter is ready for immediate use. Roanna Banning, MD [facility] Radiology Electronically Signed   By: Roanna Banning M.D.   On: 01/18/2023 16:58    Scheduled Meds:  Chlorhexidine Gluconate Cloth  6 each Topical Daily   famotidine  10 mg Oral BID   feeding supplement  1 Container Oral TID BM   gabapentin  300 mg Oral QHS   guaiFENesin  1,200 mg Oral BID   ipratropium  0.5 mg Nebulization BID   levalbuterol  0.63 mg Nebulization BID   sodium chloride  1 g Oral TID WC   venlafaxine XR  37.5 mg Oral Daily   Continuous Infusions:   LOS: 8 days    Time spent: 50 mins    Willeen Niece, MD [facility]   If 7PM-7AM, please contact night-coverage

## 2023-01-20 NOTE — Progress Notes (Signed)
ONCOLOGY CC: Patient is much more awake and alert.  She is still moderately tired and wishes to go home tomorrow HPI: Patient with metastatic breast cancer who has just finished Parrot radiation to the spine today and received her first cycle of Enhertu last Friday for acute onset of elevated bilirubin.  She tolerated Enhertu extremely well.  The bilirubin had come down but it is starting to climb back up again.  Overall her strength and energy levels improving and her diet is also slightly getting better.     Latest Ref Rng & Units 01/20/2023    5:00 AM 01/19/2023   11:30 AM 01/18/2023    5:38 AM  CBC  WBC 4.0 - 10.5 K/uL 4.1  3.7  6.4   Hemoglobin 12.0 - 15.0 g/dL 9.8  8.8  9.1   Hematocrit 36.0 - 46.0 % 29.5  27.5  27.8   Platelets 150 - 400 K/uL 31  37  20       Latest Ref Rng & Units 01/20/2023    5:00 AM 01/19/2023   11:30 AM 01/18/2023    5:38 AM  CMP  Glucose 70 - 99 mg/dL 324  401  027   BUN 8 - 23 mg/dL 18  24  29    Creatinine 0.44 - 1.00 mg/dL 2.53  6.64  4.03   Sodium 135 - 145 mmol/L 129  128  131   Potassium 3.5 - 5.1 mmol/L 4.8  4.5  4.9   Chloride 98 - 111 mmol/L 99  99  102   CO2 22 - 32 mmol/L 22  23  22    Calcium 8.9 - 10.3 mg/dL 7.8  7.5  7.7   Total Protein 6.5 - 8.1 g/dL 6.0  5.5  5.6   Total Bilirubin 0.3 - 1.2 mg/dL 3.7  3.4  3.5   Alkaline Phos 38 - 126 U/L 345  303  276   AST 15 - 41 U/L 274  273  298   ALT 0 - 44 U/L 82  78  77    Assessment and plan: Metastatic breast cancer with liver metastases and hyperbilirubinemia: Status post first cycle of Enhertu.  With the reduction bilirubin it is giving [place] hope that the Enhertu will work.  We expect that the bilirubin may slightly trend upwards.  Her next chemotherapy has been scheduled for June 7. We will follow her next Tuesday to check up on her labs. Shortness of breath: He does not appear to be as bad as when she arrived.  Potentially she could be weaned off and discontinued from the home  oxygen. Pancytopenia: Because of bone marrow involvement by metastatic breast cancer. From our standpoint patient is able to get discharged home tomorrow.

## 2023-01-20 NOTE — Telephone Encounter (Signed)
Please arrange for posthospital follow-up with APP/MH in 3 to 4 weeks For pulmonary hypertension

## 2023-01-20 NOTE — Telephone Encounter (Signed)
ATC X1 LVM for patient to call the office back. Please advise HFU has been scheduled with Beth for 02/11/23 @ 11:30am.

## 2023-01-21 ENCOUNTER — Other Ambulatory Visit (HOSPITAL_BASED_OUTPATIENT_CLINIC_OR_DEPARTMENT_OTHER): Payer: Self-pay

## 2023-01-21 DIAGNOSIS — J9601 Acute respiratory failure with hypoxia: Secondary | ICD-10-CM | POA: Diagnosis not present

## 2023-01-21 LAB — COMPREHENSIVE METABOLIC PANEL
ALT: 68 U/L — ABNORMAL HIGH (ref 0–44)
AST: 215 U/L — ABNORMAL HIGH (ref 15–41)
Albumin: 2.5 g/dL — ABNORMAL LOW (ref 3.5–5.0)
Alkaline Phosphatase: 301 U/L — ABNORMAL HIGH (ref 38–126)
Anion gap: 7 (ref 5–15)
BUN: 15 mg/dL (ref 8–23)
CO2: 20 mmol/L — ABNORMAL LOW (ref 22–32)
Calcium: 7.3 mg/dL — ABNORMAL LOW (ref 8.9–10.3)
Chloride: 101 mmol/L (ref 98–111)
Creatinine, Ser: 0.53 mg/dL (ref 0.44–1.00)
GFR, Estimated: >60 mL/min (ref >60)
Glucose, Bld: 99 mg/dL (ref 70–99)
Potassium: 4.3 mmol/L (ref 3.5–5.1)
Sodium: 128 mmol/L — ABNORMAL LOW (ref 135–145)
Total Bilirubin: 3.3 mg/dL — ABNORMAL HIGH (ref 0.3–1.2)
Total Protein: 5.2 g/dL — ABNORMAL LOW (ref 6.5–8.1)

## 2023-01-21 LAB — CBC WITH DIFFERENTIAL/PLATELET
Abs Immature Granulocytes: 0.15 10*3/uL — ABNORMAL HIGH (ref 0.00–0.07)
Basophils Absolute: 0 10*3/uL (ref 0.0–0.1)
Basophils Relative: 1 %
Eosinophils Absolute: 0.1 10*3/uL (ref 0.0–0.5)
Eosinophils Relative: 1 %
HCT: 25.8 % — ABNORMAL LOW (ref 36.0–46.0)
Hemoglobin: 8.6 g/dL — ABNORMAL LOW (ref 12.0–15.0)
Immature Granulocytes: 4 %
Lymphocytes Relative: 23 %
Lymphs Abs: 0.9 10*3/uL (ref 0.7–4.0)
MCH: 32.5 pg (ref 26.0–34.0)
MCHC: 33.3 g/dL (ref 30.0–36.0)
MCV: 97.4 fL (ref 80.0–100.0)
Monocytes Absolute: 0.3 10*3/uL (ref 0.1–1.0)
Monocytes Relative: 8 %
Neutro Abs: 2.5 10*3/uL (ref 1.7–7.7)
Neutrophils Relative %: 63 %
Platelets: 23 10*3/uL — CL (ref 150–400)
RBC: 2.65 MIL/uL — ABNORMAL LOW (ref 3.87–5.11)
RDW: 20.4 % — ABNORMAL HIGH (ref 11.5–15.5)
WBC: 4 10*3/uL (ref 4.0–10.5)
nRBC: 3.5 % — ABNORMAL HIGH (ref 0.0–0.2)

## 2023-01-21 LAB — PHOSPHORUS: Phosphorus: 2.1 mg/dL — ABNORMAL LOW (ref 2.5–4.6)

## 2023-01-21 LAB — MAGNESIUM: Magnesium: 2 mg/dL (ref 1.7–2.4)

## 2023-01-21 MED ORDER — HEPARIN SOD (PORK) LOCK FLUSH 100 UNIT/ML IV SOLN
500.0000 [IU] | Freq: Once | INTRAVENOUS | Status: AC
  Administered 2023-01-21: 500 [IU] via INTRAVENOUS
  Filled 2023-01-21: qty 5

## 2023-01-21 MED ORDER — SODIUM PHOSPHATES 45 MMOLE/15ML IV SOLN
30.0000 mmol | Freq: Once | INTRAVENOUS | Status: AC
  Administered 2023-01-21: 30 mmol via INTRAVENOUS
  Filled 2023-01-21: qty 10

## 2023-01-21 MED ORDER — IPRATROPIUM BROMIDE 0.02 % IN SOLN: 0.5000 mg | Freq: Two times a day (BID) | RESPIRATORY_TRACT | 12 refills | Status: DC

## 2023-01-21 MED ORDER — SODIUM CHLORIDE 1 G PO TABS
1.0000 g | ORAL_TABLET | Freq: Three times a day (TID) | ORAL | 1 refills | Status: DC
  Filled 2023-01-21: qty 30, 10d supply, fill #0

## 2023-01-21 NOTE — Progress Notes (Signed)
Patient Care Team: Carylon Perches, MD as PCP - General (Internal Medicine) Almond Lint, MD as Consulting Physician (General Surgery) Lonie Peak, MD as Attending Physician (Radiation Oncology) Patton Salles, MD as Consulting Physician (Obstetrics and Gynecology) Venancio Poisson, MD as Consulting Physician (Dermatology) Crist Fat, MD as Consulting Physician (Urology) Charna Elizabeth, MD as Consulting Physician (Gastroenterology) Sheral Apley, MD as Attending Physician (Orthopedic Surgery) Serena Croissant, MD as Consulting Physician (Hematology and Oncology)  DIAGNOSIS: No diagnosis found.  SUMMARY OF ONCOLOGIC HISTORY: Oncology History  Malignant neoplasm of lower-inner quadrant of right breast of female, estrogen receptor positive (HCC)  07/03/2019 Initial Diagnosis   right lower inner quadrant biopsy, for a clinicaly multiple T1c N0, stage IA invasive ductal carcinoma, grade 2, E-cadherin positive, strongly estrogen receptor positive but progesterone receptor negative and HER-2 not amplified, with an MIB-1 of 5%             (a) breast MRI 08/07/2019 showed 2 additional suspicious areas in the right breast and 1 in the left breast              (b) biopsy of all 3 areas 08/21/2019 showed no malignancy   07/26/2019 -  Anti-estrogen oral therapy   tamoxifen started neoadjuvantly 07/26/2019; to be continued for 10 years.  S/p TAH/BSO on 01/16/2020 with benign pathology   08/04/2019 Genetic Testing   Negative genetic testing:  No pathogenic variants detected on the [facility] Breast Cancer STAT panel or the Common Hereditary Cancers panel. The report date is 08/04/2019.  The STAT Breast cancer panel offered by [facility] includes sequencing and rearrangement analysis for the following 9 genes:  ATM, BRCA1, BRCA2, CDH1, CHEK2, PALB2, PTEN, STK11 and TP53.  The Common Hereditary Cancers Panel offered by [facility] includes sequencing and/or deletion duplication testing of the following  48 genes: APC, ATM, AXIN2, BARD1, BMPR1A, BRCA1, BRCA2, BRIP1, CDH1, CDK4, CDKN2A (p14ARF), CDKN2A (p16INK4a), CHEK2, CTNNA1, DICER1, EPCAM (Deletion/duplication testing only), GREM1 (promoter region deletion/duplication testing only), KIT, MEN1, MLH1, MSH2, MSH3, MSH6, MUTYH, NBN, NF1, NHTL1, PALB2, PDGFRA, PMS2, POLD1, POLE, PTEN, RAD50, RAD51C, RAD51D, RNF43, SDHB, SDHC, SDHD, SMAD4, SMARCA4. STK11, TP53, TSC1, TSC2, and VHL.  The following genes were evaluated for sequence changes only: SDHA and HOXB13 c.251G>A variant only.    10/04/2019 Cancer Staging   Staging form: Breast, AJCC 8th Edition - Pathologic stage from 10/04/2019: Stage IIB (pT2, pN1a(sn), cM0, G2, ER+, PR-, HER2-) - Signed by Loa Socks, NP on 10/18/2019   10/04/2019 Surgery    right lumpectomy and sentinel lymph node sampling 10/04/2019 showed a pT2 pN1, stage IIB invasive ductal carcinoma, grade 2, with positive lymphovascular invasion and a positive superior margin             (a) 3 sentinel lymph nodes removed, one with macro metastatic deposit, 1 with a micrometastatic deposit             (b) additional surgery for margin clearance   10/04/2019 Miscellaneous    MammaPrint on the 10/04/2019 sample shows a low risk luminal A tumor predicting a 5-year metastasis free survival of 96% without chemotherapy, and a chemotherapy benefit of less than 1.5%    11/27/2019 - 01/05/2020 Radiation Therapy    Site Technique Total Dose (Gy) Dose per Fx (Gy) Completed Fx Beam Energies  Breast, Right: Breast_Rt 3D 50/50 2 25/25 6X, 10X  Breast, Right: Breast_Rt_SCV_PAB 3D 50/50 2 25/25 6X, 10X  Breast, Right: Breast_Rt_Bst 3D 10/10 2 5/5 6X, 10X  09/29/2022 Imaging   IMPRESSION: 1. Surgical changes involving the right medial breast. No obvious recurrent breast mass or chest wall mass. 2. Mediastinal and hilar lymphadenopathy suggesting metastatic disease. 3. Patchy tree-in-bud type nodularity in the lungs suggesting chronic  inflammation or atypical infection such as MAC. No definite pulmonary metastatic nodules. 4. Large partially necrotic mass involving most of the right hepatic lobe and a 2.5 cm lesion in segment 3 of the liver consistent with metastatic disease. 5. 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   10/06/2022 Initial Biopsy   Liver biopsy: + for malignancy, metastatic poorly differentiated carcinoma, likely breast origin, biomarkers pending, insufficient tissue for molecular testing. (Resulted in EPIC on 10/13/2022)   10/07/2022 PET scan   PET scan on 10/07/2022 that showed hypermetabolic metastatic breast cancer with hypermetabolic mediastinal and hilar adenopathy, hypermetabolic hepatic metastatic disease and bone disease.  There were no findings for pulmonary metastatic disease.   10/21/2022 -  Anti-estrogen oral therapy   Verzinio and Letrozole   01/15/2023 -  Chemotherapy   Patient is on Treatment Plan : BREAST METASTATIC Fam-Trastuzumab Deruxtecan-nxki (Enhertu) (5.4) q21d     Cancer, metastatic to bone (HCC)  12/21/2022 Initial Diagnosis   Cancer, metastatic to bone (HCC)   01/15/2023 -  Chemotherapy   Patient is on Treatment Plan : BREAST METASTATIC Fam-Trastuzumab Deruxtecan-nxki (Enhertu) (5.4) q21d       CHIEF COMPLIANT:  Follow-up on letrozole/ Verzenio     INTERVAL HISTORY: Theresa [name] is a 67 y.o female with the above-mentioned Estrogen receptor positive breast cancer. Currently on letrozole. She presents to the clinic for a follow-up.    ALLERGIES:  is allergic to metronidazole, other, and tape.  MEDICATIONS:  Current Outpatient Medications  Medication Sig Dispense Refill   abemaciclib (VERZENIO) 100 MG tablet Take 1 tablet (100 mg total) by mouth 2 (two) times daily. 56 tablet 1   dexamethasone (DECADRON) 4 MG tablet Take 1 tablet (4 mg total) by mouth daily. Take 1 tablet day chemotherapy 1 tablet 2 days after chemo with food 30  tablet 0   famotidine (PEPCID) 10 MG tablet Take 10 mg by mouth 2 (two) times daily.     gabapentin (NEURONTIN) 300 MG capsule Take 1 capsule (300 mg total) by mouth at bedtime. 90 capsule 4   guaiFENesin (MUCINEX) 600 MG 12 hr tablet Take 600 mg by mouth 2 (two) times daily.     ipratropium (ATROVENT) 0.02 % nebulizer solution Take 2.5 mLs (0.5 mg total) by nebulization 2 (two) times daily. 75 mL 12   letrozole (FEMARA) 2.5 MG tablet TAKE 1 TABLET BY MOUTH EVERY DAY 90 tablet 1   LORazepam (ATIVAN) 0.5 MG tablet TAKE 1 TABLET BY MOUTH EVERY 8 HOURS AS NEEDED FOR ANXIETY. (Patient taking differently: Take 0.5 mg by mouth every 8 (eight) hours as needed for anxiety. for anxiety) 15 tablet 0   ondansetron (ZOFRAN) 8 MG tablet Take 1 tablet (8 mg total) by mouth every 8 (eight) hours as needed for nausea or vomiting. 30 tablet 2   oxyCODONE-acetaminophen (PERCOCET/ROXICET) 5-325 MG tablet Take 1 tablet by mouth every 6 (six) hours as needed for severe pain. 30 tablet 0   prochlorperazine (COMPAZINE) 10 MG tablet Take 1 tablet (10 mg total) by mouth every 6 (six) hours as needed for nausea or vomiting. 30 tablet 1   sodium chloride 1 g tablet Take 1 tablet (1 g total) by mouth 3 (three) times daily with  meals. 30 tablet 1   venlafaxine XR (EFFEXOR-XR) 37.5 MG 24 hr capsule Take 1 capsule (37.5 mg total) by mouth daily. 90 capsule 4   No current facility-administered medications for this visit.    PHYSICAL EXAMINATION: ECOG PERFORMANCE STATUS: {CHL ONC ECOG [phone]}  There were no vitals filed for this visit. There were no vitals filed for this visit.  BREAST:*** No palpable masses or nodules in either right or left breasts. No palpable axillary supraclavicular or infraclavicular adenopathy no breast tenderness or nipple discharge. (exam performed in the presence of a chaperone)  LABORATORY DATA:  I have reviewed the data as listed    Latest Ref Rng & Units 01/21/2023    6:25 AM 01/20/2023     5:00 AM 01/19/2023   11:30 AM  CMP  Glucose 70 - 99 mg/dL 99  161  096   BUN 8 - 23 mg/dL 15  18  24    Creatinine 0.44 - 1.00 mg/dL 0.45  4.09  8.11   Sodium 135 - 145 mmol/L 128  129  128   Potassium 3.5 - 5.1 mmol/L 4.3  4.8  4.5   Chloride 98 - 111 mmol/L 101  99  99   CO2 22 - 32 mmol/L 20  22  23    Calcium 8.9 - 10.3 mg/dL 7.3  7.8  7.5   Total Protein 6.5 - 8.1 g/dL 5.2  6.0  5.5   Total Bilirubin 0.3 - 1.2 mg/dL 3.3  3.7  3.4   Alkaline Phos 38 - 126 U/L 301  345  303   AST 15 - 41 U/L 215  274  273   ALT 0 - 44 U/L 68  82  78     Lab Results  Component Value Date   WBC 4.0 01/21/2023   HGB 8.6 (L) 01/21/2023   HCT 25.8 (L) 01/21/2023   MCV 97.4 01/21/2023   PLT 23 (LL) 01/21/2023   NEUTROABS 2.5 01/21/2023    ASSESSMENT & PLAN:  No problem-specific Assessment & Plan notes found for this encounter.    No orders of the defined types were placed in this encounter.  The patient has a good understanding of the overall plan. she agrees with it. she will call with any problems that may develop before the next visit here. Total time spent: 30 mins including face to face time and time spent for planning, charting and co-ordination of care   Sherlyn Lick, CMA 01/21/23    I Janan Ridge am acting as a Neurosurgeon for [employer]  ***

## 2023-01-21 NOTE — Discharge Instructions (Signed)
Advised to follow-up with primary care physician in 1 week. Advised to follow-up with oncologist Dr. Ave Filter on February 04, 2023. Advised to continue current medications.

## 2023-01-21 NOTE — Discharge Summary (Signed)
Physician Discharge Summary  Theresa [name]:[id number] DOB: [birth year] DOA: 01/11/2023  PCP: Carylon Perches, MD  Admit date: 01/11/2023  Discharge date: 01/21/2023  Admitted From: Home.  Disposition:  Home Health Services  Recommendations for Outpatient Follow-up:  Follow up with PCP in 1-2 weeks Please obtain BMP/CBC in one week Advised to follow-up with oncologist Dr. Ave Filter on February 04, 2023. Advised to continue current medications.  Home Health: Home PT/OT Equipment/Devices: Home oxygen @ 3L/Min  Discharge Condition: Stable CODE STATUS:Full code Diet recommendation: Heart Healthy   Brief [facility] Course: This 67 year old female with past medical history significant for breast cancer with metastasis to liver and spine on chemotherapy and radiation therapy presented to the ED with shortness of breath, fatigue, productive cough.  CT was negative for PE,  but showed groundglass opacity.  Patient was started on IV antibiotics.  Patient developed worsening thrombocytopenia during hospitalization.  She continued to require oxygen supplementation. She developed worsening hyperbilirubinemia and transaminases.  MRCP showed: Extensive hepatic metastatic disease in the right hepatic lobe and a smaller metastasis in the left hepatic lobe.  Overall volume appears to be enlarged by metastasis in comparison to previous PET scan.  No evidence of biliary duct dilation, no intrahepatic or extrahepatic biliary duct dilation. In regards to acute on chronic respiratory failure, She continued to remain on 3 L of oxygen, Treated with IV antibiotics.  Pulmonologist was consulted.  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  Continue Lasix as needed. She will need outpatient pulmonology follow up. Patient has continued radiation treatment on 5/22.  Her sodium has improved, platelet count remained stable.  Oncologist signed off recommended patient can be  discharged.  Patient wants to be discharged.  Discharge Diagnoses:  Principal Problem:   Acute hypoxic respiratory failure (HCC) Active Problems:   Cancer, metastatic to bone (HCC)   PNA (pneumonia)   Hypoxic respiratory failure (HCC)   LFT elevation   Hepatomegaly   Compression fracture of T9 vertebra (HCC)   Compression fracture of T10 vertebra (HCC)   Pneumonia  Acute hypoxic respiratory failure: Likely pulmonary shunt and pulmonary artery hypertension in the setting of liver metastatic disease: - She presented with shortness of breath, productive cough,  -CT angio showed groundglass opacities suspicious for multifocal pneumonia. -Patient has completed 5 days of azithromycin and completed 7 days of ceftriaxone. -There was some concern about lymphangitic spread of the tumor by oncology. -Continue with scheduled guaifenesin and nebulizer treatment. -BNP in  400 range, echo normal EF, pulmonary shunt,  severely elevated pulmonary pressure.  -MRSA PCR negative.  -She received low dose IV steroid on  5/17 - Maintaining Oxygen sat 94 % on 3 L.  - She has received IV lasix 5/19, 5/20.   Right-sided breast cancer,  metastatic disease to the liver and spine: -S/p lumpectomy.  She was on letrozole and Verzenio.  Currently on hold. -She is undergoing radiation treatments.  -Dr. Georgiann Mohs following and appreciate assistance.   Severe thrombocytopenia: -Monitor for bleeding. -Could be in the setting of infection, side effect of chemotherapy,  and metastatic disease. -She received 2 units of platelets on 5/15.  Platelet increased to 50--- now down to 25 -She will need platelets transfusion if she requires any procedure -She has received Platelet transfusion  on 5/18.  -Platelet count 37> 31> 23.   Hyperbilirubinemia / Elevated liver enzymes: -5/17: Worsening LFT,  Bilirubin increased to 5.0 -MRCP; negative for biliary or extra  hepatic biliary obstruction.  Extensive hepatic metastasis in  the RIGHT hepatic lobe and smaller metastasis in the LEFT hepatic lobe. Overall volume of the liver is enlarged by metastasis and mildly increased from comparison PET-CT scan in February.  Dr Pamelia Hoit, recommend starting Chemo. She has received Chemo on  5/17 lower dose.  Bilirubin down to  3.5.  Patient will follow-up with oncology outpatient.   Hyponatremia:  Improved with sodium tablet.  Sodium table held, patient was getting IV lasix.  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  Resume Sodium Tablet 5/21. Repeat labs in am   Macrocytic anemia, in the setting of malignancy.   She received 1 unit of packed red blood cell on 5/15, transfusion had to be stopped because of concern of transfusion reaction with fever. Received one unit PRBC 5/18 Hb at 8.8 > 9.8   Debility/deconditioning: PT recommended home with home PT.   Goals of care: Patient with poor prognosis due to metastatic disease.     T10 vertebra compression fracture:  Continue with supportive care pain management.   -Status post kyphoplasty for T9 compression fracture.     Nutrition Problem: Increased nutrient needs Etiology: cancer and cancer related treatments    Discharge Instructions  Discharge Instructions     Call MD for:  difficulty breathing, headache or visual disturbances   Complete by: As directed    Call MD for:  persistant dizziness or light-headedness   Complete by: As directed    Call MD for:  persistant nausea and vomiting   Complete by: As directed    Diet - low sodium heart healthy   Complete by: As directed    Diet Carb Modified   Complete by: As directed    Discharge instructions   Complete by: As directed    Advised to follow-up with primary care physician in 1 week. Advised to follow-up with oncologist Dr. Ave Filter on February 04, 2023. Advised to continue current medications.   Increase activity slowly   Complete by: As directed    No wound care   Complete by: As directed       Allergies as of 01/21/2023        Reactions   Metronidazole Other (See Comments)   C-Diff C-Diff C-Diff   Other Other (See Comments), Rash   Dermabond - redness and burning, blistering Dermabond - redness and burning, blistering Dermabond - redness and burning, blistering "burning" skin   Tape Rash   "burning" skin "burning" skin        Medication List     STOP taking these medications    hydrocortisone 2.5 % cream   loperamide 2 MG capsule Commonly known as: IMODIUM   triamcinolone 0.025 % ointment Commonly known as: KENALOG   valsartan 40 MG tablet Commonly known as: DIOVAN       TAKE these medications    dexamethasone 4 MG tablet Commonly known as: DECADRON Take 1 tablet (4 mg total) by mouth daily. Take 1 tablet day chemotherapy 1 tablet 2 days after chemo with food   famotidine 10 MG tablet Commonly known as: PEPCID Take 10 mg by mouth 2 (two) times daily.   gabapentin 300 MG capsule Commonly known as: NEURONTIN Take 1 capsule (300 mg total) by mouth at bedtime.   guaiFENesin 600 MG 12 hr tablet Commonly known as: MUCINEX Take 600 mg by mouth 2 (two) times daily.   ipratropium 0.02 % nebulizer solution Commonly known as: ATROVENT Take 2.5 mLs (0.5 mg total) by nebulization 2 (two)  times daily.   letrozole 2.5 MG tablet Commonly known as: FEMARA TAKE 1 TABLET BY MOUTH EVERY DAY   LORazepam 0.5 MG tablet Commonly known as: ATIVAN TAKE 1 TABLET BY MOUTH EVERY 8 HOURS AS NEEDED FOR ANXIETY. What changed: reasons to take this   ondansetron 8 MG tablet Commonly known as: ZOFRAN Take 1 tablet (8 mg total) by mouth every 8 (eight) hours as needed for nausea or vomiting.   oxyCODONE-acetaminophen 5-325 MG tablet Commonly known as: PERCOCET/ROXICET Take 1 tablet by mouth every 6 (six) hours as needed for severe pain.   prochlorperazine 10 MG tablet Commonly known as: COMPAZINE Take 1 tablet (10 mg total) by mouth every 6 (six) hours as needed for nausea or vomiting.   sodium  chloride 1 g tablet Take 1 tablet (1 g total) by mouth 3 (three) times daily with meals.   venlafaxine XR 37.5 MG 24 hr capsule Commonly known as: EFFEXOR-XR Take 1 capsule (37.5 mg total) by mouth daily.   Verzenio 100 MG tablet Generic drug: abemaciclib Take 1 tablet (100 mg total) by mouth 2 (two) times daily.               Durable Medical Equipment  (From admission, onward)           Start     Ordered   01/21/23 1046  For home use only DME Bedside commode  Once       Question Answer Comment  Patient needs a bedside commode to treat with the following condition Weakness   Patient needs a bedside commode to treat with the following condition SOB (shortness of breath)      01/21/23 1045   01/21/23 1018  DME Oxygen  Once       Question Answer Comment  Length of Need Lifetime   Mode or (Route) Nasal cannula   Liters per Minute 3   Oxygen conserving device Yes   Oxygen delivery system Gas      01/21/23 1017   01/19/23 1443  For home use only DME Nebulizer machine  Once       Question Answer Comment  Patient needs a nebulizer to treat with the following condition Chronic respiratory failure (HCC)   Length of Need Lifetime      01/19/23 1444   01/14/23 0809  For home use only DME oxygen  Once       Question Answer Comment  Length of Need Lifetime   Mode or (Route) Nasal cannula   Liters per Minute 4   Oxygen delivery system Gas      01/14/23 0808            Follow-up Information     [facility] Follow up.   Specialty: Home Health Services Why: Your home health has been set up with [facility]. The office will call you with start of service information. if you have any questions or concerns please call the number listed above. Contact information: [address] [place] [address] [phone]         Carylon Perches, MD Follow up in 1 week(s).   Specialty: Internal Medicine Contact information: [address] [place] [address] [phone]         Serena Croissant, MD Follow up in 2 week(s).   Specialty: Hematology and Oncology Contact information: [address] [place] [address] [phone]  Allergies  Allergen Reactions   Metronidazole Other (See Comments)    C-Diff C-Diff C-Diff   Other Other (See Comments) and Rash    Dermabond - redness and burning, blistering Dermabond - redness and burning, blistering Dermabond - redness and burning, blistering "burning" skin   Tape Rash    "burning" skin "burning" skin    Consultations: Oncology   Procedures/Studies: IR IMAGING GUIDED PORT INSERTION  Result Date: 01/18/2023 INDICATION: Chemotherapy.  History of metastatic breast cancer. EXAM: IMPLANTED PORT A CATH PLACEMENT WITH ULTRASOUND AND FLUOROSCOPIC GUIDANCE MEDICATIONS: None ANESTHESIA/SEDATION: Moderate (conscious) sedation was employed during this procedure. A total of Versed 2 mg and Fentanyl 100 mcg was administered intravenously. Moderate Sedation Time: 25 minutes. The patient's level of consciousness and vital signs were monitored continuously by radiology nursing throughout the procedure under my direct supervision. FLUOROSCOPY TIME:  Fluoroscopic dose; 0 mGy COMPLICATIONS: None immediate. PROCEDURE: The procedure, risks, benefits, and alternatives were explained to the patient and/or patient's representative . Questions regarding the procedure were encouraged and answered. The patient and/or patient's representative understands and consents to the procedure. The RIGHT neck and chest were prepped with chlorhexidine in a sterile fashion, and a sterile drape was applied covering the operative field. Maximum barrier sterile technique with sterile gowns and gloves were used for the procedure. A timeout was performed prior to the initiation of the procedure. Local anesthesia was provided with 1% lidocaine with epinephrine. After  creating a small venotomy incision, a micropuncture kit was utilized to access the internal jugular vein under direct, real-time ultrasound guidance. Ultrasound image documentation was performed. The microwire was kinked to measure appropriate catheter length. A subcutaneous port pocket was then created along the upper chest wall utilizing a combination of sharp and blunt dissection. The pocket was irrigated with sterile saline. A single lumen ISP power injectable port was chosen for placement. The 8 Fr catheter was tunneled from the port pocket site to the venotomy incision. The port was placed in the pocket. The external catheter was trimmed to appropriate length. At the venotomy, an 8 Fr peel-away sheath was placed over a guidewire under fluoroscopic guidance. The catheter was then placed through the sheath and the sheath was removed. Final catheter positioning was confirmed and documented with a fluoroscopic spot radiograph. The port was accessed with a Huber needle, aspirated and flushed with heparinized saline. The port pocket incision was closed with interrupted 3-0 Vicryl suture then Dermabond was applied, including at the venotomy incision. Dressings were placed. The patient tolerated the procedure well without immediate post procedural complication. IMPRESSION: Successful placement of a RIGHT internal jugular approach power injectable Port-A-Cath. The tip of the catheter is positioned within the proximal RIGHT atrium. The catheter is ready for immediate use. Roanna Banning, MD [facility] Electronically Signed   By: Roanna Banning M.D.   On: 01/18/2023 16:58   ECHOCARDIOGRAM COMPLETE BUBBLE STUDY  Result Date: 01/16/2023    ECHOCARDIOGRAM REPORT   Patient Name:   Theresa [name] Date of Exam: 01/16/2023 Medical Rec #:  [id number]     Height:       65.0 in Accession #:    [id number]    Weight:       146.4 lb Date of Birth:  01/05/1956     BSA:           1.732 m Patient Age:    67 years      BP:  113/67 mmHg Patient Gender: F             HR:           104 bpm. Exam Location:  Inpatient Procedure: 2D Echo, Cardiac Doppler and Color Doppler Indications:    Hypoxia  History:        Patient has prior history of Echocardiogram examinations. Risk                 Factors:Hypertension.  Sonographer:    Mike Gip Referring Phys: [phone] BELKYS A REGALADO IMPRESSIONS  1. 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 Moderate RV dilation with normal function. Severely elevated pulmonary pressures.  2. Right ventricular systolic function is normal. The right ventricular size is moderately enlarged. There is severely elevated pulmonary artery systolic pressure. The estimated right ventricular systolic pressure is 72.2 mmHg.  3. Left ventricular ejection fraction, by estimation, is 55 to 60%. The left ventricle has normal function. The left ventricle has no regional wall motion abnormalities. Indeterminate diastolic filling due to E-A fusion. There is the interventricular septum is flattened in systole, consistent with right ventricular pressure overload and the interventricular septum is flattened in systole and diastole, consistent with right ventricular pressure and volume overload.  4. The mitral valve is grossly normal. Trivial mitral valve regurgitation. No evidence of mitral stenosis.  5. Tricuspid valve regurgitation is mild to moderate.  6. The aortic valve is tricuspid. Aortic valve regurgitation is not visualized. No aortic stenosis is present.  7. The inferior vena cava is dilated in size with <50% respiratory variability, suggesting right atrial pressure of 15 mmHg.  8. Agitated saline contrast bubble study was positive with shunting observed after >6 cardiac cycles suggestive of intrapulmonary shunting. FINDINGS  Left Ventricle: Left ventricular ejection fraction, by estimation, is 55 to 60%. The left ventricle has normal function. The left  ventricle has no regional wall motion abnormalities. The left ventricular internal cavity size was normal in size. There is  no left ventricular hypertrophy. The interventricular septum is flattened in systole, consistent with right ventricular pressure overload and the interventricular septum is flattened in systole and diastole, consistent with right ventricular pressure and volume overload. Indeterminate diastolic filling due to E-A fusion. Right Ventricle: The right ventricular size is moderately enlarged. No increase in right ventricular wall thickness. Right ventricular systolic function is normal. There is severely elevated pulmonary artery systolic pressure. The tricuspid regurgitant velocity is 3.78 m/s, and with an assumed right atrial pressure of 15 mmHg, the estimated right ventricular systolic pressure is 72.2 mmHg. Left Atrium: Left atrial size was normal in size. Right Atrium: Right atrial size was normal in size. Pericardium: There is no evidence of pericardial effusion. Mitral Valve: The mitral valve is grossly normal. Trivial mitral valve regurgitation. No evidence of mitral valve stenosis. Tricuspid Valve: The tricuspid valve is grossly normal. Tricuspid valve regurgitation is mild to moderate. No evidence of tricuspid stenosis. Aortic Valve: The aortic valve is tricuspid. Aortic valve regurgitation is not visualized. No aortic stenosis is present. Pulmonic Valve: The pulmonic valve was grossly normal. Pulmonic valve regurgitation is trivial. No evidence of pulmonic stenosis. Aorta: The aortic root and ascending aorta are structurally normal, with no evidence of dilitation. Venous: The inferior vena cava is dilated in size with less than 50% respiratory variability, suggesting right atrial pressure of 15 mmHg. IAS/Shunts: The atrial septum is grossly normal. Agitated saline contrast was given intravenously to evaluate for intracardiac shunting. Agitated saline contrast bubble study was  positive  with shunting observed after >6 cardiac cycles suggestive of intrapulmonary shunting.  LEFT VENTRICLE PLAX 2D LVIDd:         3.70 cm     Diastology LVIDs:         2.60 cm     LV e' medial:    7.18 cm/s LV PW:         1.00 cm     LV E/e' medial:  11.0 LV IVS:        1.00 cm     LV e' lateral:   9.03 cm/s LVOT diam:     2.00 cm     LV E/e' lateral: 8.8 LV SV:         58 LV SV Index:   33 LVOT Area:     3.14 cm  LV Volumes (MOD) LV vol d, MOD A2C: 93.7 ml LV vol d, MOD A4C: 69.2 ml LV vol s, MOD A2C: 39.4 ml LV vol s, MOD A4C: 30.2 ml LV SV MOD A2C:     54.3 ml LV SV MOD A4C:     69.2 ml LV SV MOD BP:      49.8 ml RIGHT VENTRICLE             IVC RV Basal diam:  4.80 cm     IVC diam: 2.40 cm RV S prime:     14.40 cm/s TAPSE (M-mode): 2.2 cm LEFT ATRIUM             Index        RIGHT ATRIUM           Index LA diam:        2.90 cm 1.67 cm/m   RA Area:     20.20 cm LA Vol (A2C):   55.6 ml 32.09 ml/m  RA Volume:   59.50 ml  34.34 ml/m LA Vol (A4C):   35.2 ml 20.32 ml/m LA Biplane Vol: 46.2 ml 26.67 ml/m  AORTIC VALVE LVOT Vmax:   97.90 cm/s LVOT Vmean:  62.700 cm/s LVOT VTI:    0.184 m  AORTA Ao Root diam: 3.20 cm Ao Asc diam:  3.30 cm MITRAL VALVE               TRICUSPID VALVE MV Area (PHT): 5.16 cm    TR Peak grad:   57.2 mmHg MV Decel Time: 147 msec    TR Vmax:        378.00 cm/s MV E velocity: 79.20 cm/s MV A velocity: 98.80 cm/s  SHUNTS MV E/A ratio:  0.80        Systemic VTI:  0.18 m                            Systemic Diam: 2.00 cm Lennie Odor MD Electronically signed by Lennie Odor MD Signature Date/Time: 01/16/2023/12:23:45 PM    Final    MR ABDOMEN MRCP W WO CONTAST  Result Date: 01/15/2023 CLINICAL DATA:  Breast cancer with hepatic metastasis. Elevated bilirubin. EXAM: MRI ABDOMEN WITHOUT AND WITH CONTRAST (INCLUDING MRCP) TECHNIQUE: Multiplanar multisequence MR imaging of the abdomen was performed both before and after the administration of intravenous contrast. Heavily T2-weighted images of the  biliary and pancreatic ducts were obtained, and three-dimensional MRCP images were rendered by post processing. CONTRAST:  7mL GADAVIST GADOBUTROL 1 MMOL/ML IV SOLN COMPARISON:  CT 01/12/2023, PET-CT 10/08/2022 FINDINGS: Lower chest: Small RIGHT pleural effusion is new from comparison  CT. Hepatobiliary: Again demonstrated hypoenhancing mass occupying large portion of the RIGHT hepatic lobe involving segments 5,6,7 and 8. No change from recent CT. Broad lesion in the upper RIGHT hepatic lobe measures up to 12 cm. Smaller lesion in the posterior aspect of the lateral segment LEFT hepatic lobe measuring 2 cm (image 73/17) also unchanged. The liver has an overall enlarged volume presumably related to the metastasis. Liver volume volume is increased from comparison PET-CT scan within the LEFT lateral segment measuring 8 cm compared to 6.2 cm. New small volume of fluid along the RIGHT hepatic lobe (from 20/4/series 4) There is no intrahepatic biliary duct dilatation. The common hepatic duct and common bile duct are nondilated. Postcholecystectomy. MRCP images are degraded by respiratory imaging but no evidence of biliary duct dilatation on the other T2 weighted sequences (series 4 and series 3 for example). Pancreas: Normal pancreatic parenchymal intensity. No ductal dilatation or inflammation. Spleen: Normal spleen. Adrenals/urinary tract: Adrenal glands and kidneys are normal. Stomach/Bowel: Stomach and limited of the small bowel is unremarkable Vascular/Lymphatic: Abdominal aortic normal caliber. No retroperitoneal periportal lymphadenopathy. Musculoskeletal: Lesions at L4 and in the sacrum again noted on coronal T2 weighted imaging. (Series 3) no clear post-contrast enhancement IMPRESSION: 1. Extensive hepatic metastasis in the RIGHT hepatic lobe and smaller metastasis in the LEFT hepatic lobe. Overall volume of the liver is enlarged by metastasis and mildly increased from comparison PET-CT scan in February. 2. No  evidence of biliary duct dilatation. No intrahepatic or extrahepatic biliary duct dilatation. Postcholecystectomy. 3. Pancreas appears normal. 4. New small RIGHT pleural effusion and small volume fluid along the RIGHT hepatic lobe. Electronically Signed   By: Genevive Bi M.D.   On: 01/15/2023 12:41   MR 3D Recon At Scanner  Result Date: 01/15/2023 CLINICAL DATA:  Breast cancer with hepatic metastasis. Elevated bilirubin. EXAM: MRI ABDOMEN WITHOUT AND WITH CONTRAST (INCLUDING MRCP) TECHNIQUE: Multiplanar multisequence MR imaging of the abdomen was performed both before and after the administration of intravenous contrast. Heavily T2-weighted images of the biliary and pancreatic ducts were obtained, and three-dimensional MRCP images were rendered by post processing. CONTRAST:  7mL GADAVIST GADOBUTROL 1 MMOL/ML IV SOLN COMPARISON:  CT 01/12/2023, PET-CT 10/08/2022 FINDINGS: Lower chest: Small RIGHT pleural effusion is new from comparison CT. Hepatobiliary: Again demonstrated hypoenhancing mass occupying large portion of the RIGHT hepatic lobe involving segments 5,6,7 and 8. No change from recent CT. Broad lesion in the upper RIGHT hepatic lobe measures up to 12 cm. Smaller lesion in the posterior aspect of the lateral segment LEFT hepatic lobe measuring 2 cm (image 73/17) also unchanged. The liver has an overall enlarged volume presumably related to the metastasis. Liver volume volume is increased from comparison PET-CT scan within the LEFT lateral segment measuring 8 cm compared to 6.2 cm. New small volume of fluid along the RIGHT hepatic lobe (from 20/4/series 4) There is no intrahepatic biliary duct dilatation. The common hepatic duct and common bile duct are nondilated. Postcholecystectomy. MRCP images are degraded by respiratory imaging but no evidence of biliary duct dilatation on the other T2 weighted sequences (series 4 and series 3 for example). Pancreas: Normal pancreatic parenchymal intensity. No  ductal dilatation or inflammation. Spleen: Normal spleen. Adrenals/urinary tract: Adrenal glands and kidneys are normal. Stomach/Bowel: Stomach and limited of the small bowel is unremarkable Vascular/Lymphatic: Abdominal aortic normal caliber. No retroperitoneal periportal lymphadenopathy. Musculoskeletal: Lesions at L4 and in the sacrum again noted on coronal T2 weighted imaging. (Series 3) no clear post-contrast enhancement IMPRESSION:  1. Extensive hepatic metastasis in the RIGHT hepatic lobe and smaller metastasis in the LEFT hepatic lobe. Overall volume of the liver is enlarged by metastasis and mildly increased from comparison PET-CT scan in February. 2. No evidence of biliary duct dilatation. No intrahepatic or extrahepatic biliary duct dilatation. Postcholecystectomy. 3. Pancreas appears normal. 4. New small RIGHT pleural effusion and small volume fluid along the RIGHT hepatic lobe. Electronically Signed   By: Genevive Bi M.D.   On: 01/15/2023 12:41   DG Chest 2 View  Result Date: 01/15/2023 CLINICAL DATA:  [id number] Hypoxia [id number] EXAM: CHEST - 2 VIEW COMPARISON:  01/11/2023 FINDINGS: Mild interstitial edema or infiltrates involving bases more than apices slightly more conspicuous than on prior exam. Heart size and mediastinal contours are within normal limits. No effusion. Surgical clips right breast. Post midthoracic segment vertebral augmentation. Right upper quadrant surgical clips. IMPRESSION: Mild interstitial edema or infiltrates. Electronically Signed   By: Corlis Leak M.D.   On: 01/15/2023 10:58   CT ABDOMEN PELVIS W CONTRAST  Result Date: 01/12/2023 CLINICAL DATA:  Breast cancer with liver metastasis and spine metastasis. New pathologic fracture T10. Back pain. Decreased appetite. Worsening LFTs. * Tracking Code: BO * EXAM: CT ABDOMEN AND PELVIS WITH CONTRAST TECHNIQUE: Multidetector CT imaging of the abdomen and pelvis was performed using the standard protocol following bolus  administration of intravenous contrast. RADIATION DOSE REDUCTION: This exam was performed according to the departmental dose-optimization program which includes automated exposure control, adjustment of the mA and/or kV according to patient size and/or use of iterative reconstruction technique. CONTRAST:  OMNIPAQUE IOHEXOL 300 MG/ML  SOLN COMPARISON:  FDG PET scan 10/08/2022, CT 01/11/2023, CT 09/29/2022. FINDINGS: Lower chest: Lung bases are clear. Hepatobiliary: Large heterogeneously enhancing mass occupying a large portion of the RIGHT hepatic lobe is not significant changed from comparison PET scan. Smaller lesion inferior aspect of the lateral segment LEFT hepatic lobe again noted. Difficult to compare directly abut the LEFT hepatic lobe lesion appears enlarged from CT 09/29/2022. Postcholecystectomy. No biliary duct dilatation Pancreas: Pancreas is normal. No ductal dilatation. No pancreatic inflammation. Spleen: Normal spleen Adrenals/urinary tract: Adrenal glands and kidneys are normal. The ureters and bladder normal. Stomach/Bowel: Stomach, small bowel, appendix, and cecum are normal. The colon and rectosigmoid colon are normal. Vascular/Lymphatic: Abdominal aorta is normal caliber. No periportal or retroperitoneal adenopathy. No pelvic adenopathy. Reproductive: Post hysterectomy.  Adnexa unremarkable Other: No omental metastasis.  No peritoneal metastasis Musculoskeletal: Lucencies in increased trabeculation through the LEFT bony pelvis are unchanged. IMPRESSION: 1. No clear evidence of a hepatic metastatic disease progression. Potential enlargement of the lesion LEFT hepatic lobe compared to prior CT. 2. Mixed lytic and sclerotic lesions in the pelvis similar prior. 3. No abdominopelvic lymphadenopathy. 4. No peritoneal metastasis. Electronically Signed   By: Genevive Bi M.D.   On: 01/12/2023 16:03   US ABDOMEN LIMITED RUQ (LIVER/GB)  Result Date: 01/11/2023 CLINICAL DATA:  Transaminitis.   History of breast cancer. EXAM: ULTRASOUND ABDOMEN LIMITED RIGHT UPPER QUADRANT COMPARISON:  CT abdomen pelvis 09/29/2022. PET-CT scan 10/08/2022 FINDINGS: Gallbladder: Previous cholecystectomy. Common bile duct: Diameter: 4 mm. Liver: Heterogeneous liver with multiple heterogeneous hypoechoic mass lesions. Example left hepatic lobe measures 3.4 x 2.1 x 3.6 cm. Right hepatic lobe 4.9 x 3.2 x 4.3 cm. There also some linear echogenic areas scattered in the liver. These of uncertain etiology but could represent some biliary air. Please correlate for any known history. Portal vein is patent on color Doppler imaging with  normal direction of blood flow towards the liver. Other: Trace ascites IMPRESSION: Previous cholecystectomy. No biliary ductal dilatation. Question some pneumobilia in the right hepatic lobe with some linear echogenic areas. However, on the CT scan of the chest from 01/11/2023 earlier than there was no air in the biliary tree. Known liver metastases, please correlate with prior workup. Overall with changes today a contrast CT of the abdomen and pelvis may be of some benefit when clinically appropriate. Electronically Signed   By: Karen Kays M.D.   On: 01/11/2023 12:13   DG Chest Port 1 View  Result Date: 01/11/2023 CLINICAL DATA:  Shortness of breath, undergoing treatment for breast cancer. EXAM: PORTABLE CHEST 1 VIEW COMPARISON:  11/26/2022 FINDINGS: Cardiac and mediastinal contours are within normal limits given AP technique. No focal pulmonary opacity. No pleural effusion or pneumothorax. No acute osseous abnormality. IMPRESSION: No acute cardiopulmonary process. Electronically Signed   By: Wiliam Ke M.D.   On: 01/11/2023 11:16   CT Angio Chest PE W and/or Wo Contrast  Result Date: 01/11/2023 CLINICAL DATA:  Rule out pulmonary embolism.  High probability. EXAM: CT ANGIOGRAPHY CHEST WITH CONTRAST TECHNIQUE: Multidetector CT imaging of the chest was performed using the standard protocol  during bolus administration of intravenous contrast. Multiplanar CT image reconstructions and MIPs were obtained to evaluate the vascular anatomy. RADIATION DOSE REDUCTION: This exam was performed according to the departmental dose-optimization program which includes automated exposure control, adjustment of the mA and/or kV according to patient size and/or use of iterative reconstruction technique. CONTRAST:  75mL OMNIPAQUE IOHEXOL 350 MG/ML SOLN COMPARISON:  11/26/2022 FINDINGS: Cardiovascular: Satisfactory opacification of the pulmonary arteries to the segmental level. No evidence of pulmonary embolism. Normal heart size. No pericardial effusion. Mediastinum/Nodes: Thyroid gland, trachea and esophagus are unremarkable. Prominent subcarinal node measures 1.5 cm, image 113/5. Previously this measured 1.6 cm. Right hilar lymph node measures 1.5 cm, image 118/5. Unchanged from previous exam. No enlarged axillary lymph nodes. Lungs/Pleura: There is no pleural effusion identified. No airspace consolidation or pneumothorax. Stable solid nodule within the periphery of the left upper lobe measuring 4 mm. Mild patchy areas of ground-glass attenuation are noted within both lungs. Upper Abdomen: No acute abnormality. Again seen are geographic areas of fatty infiltration within the right hepatic lobe. Musculoskeletal: Multifocal lucent bone lesions are identified compatible with metastatic diseaseInterval cement augmentation of the T9 compression deformity. There is a new, mild anterior inferior endplate compression deformity involving the T10 vertebra with loss of approximately 10% of the vertebral body height, image 78/8. No retropulsion of fracture fragments identified. Review of the MIP images confirms the above findings. IMPRESSION: 1. No evidence for acute pulmonary embolus. 2. Mild patchy areas of ground-glass attenuation are noted within both lungs. This is a nonspecific finding and may be seen with small airways  inflammation or infection. 3. Interval cement augmentation of the T9 compression deformity. There is a new, mild anterior inferior endplate compression deformity involving the T10 vertebra with loss of approximately 10% of the vertebral body height. No retropulsion of fracture fragments identified. 4. Multifocal lucent bone lesions compatible with metastatic disease. Electronically Signed   By: Signa Kell M.D.   On: 01/11/2023 11:13   IR Radiologist Eval & Mgmt  Result Date: 12/24/2022 EXAM: ESTABLISHED PATIENT OFFICE VISIT CHIEF COMPLAINT: SEE EPIC NOTE HISTORY OF PRESENT ILLNESS: SEE EPIC NOTE REVIEW OF SYSTEMS: SEE EPIC NOTE PHYSICAL EXAMINATION: SEE EPIC NOTE ASSESSMENT AND PLAN: SEE EPIC NOTE Electronically Signed   By: Vilma Prader  Archer Asa M.D.   On: 12/24/2022 15:41     Subjective: Patient was seen and examined at bed side, She feels much better and wants to be discharged, HHS arranged.  Discharge Exam: Vitals:   01/21/23 0632 01/21/23 0850  BP: 121/75   Pulse: 99   Resp: 18   Temp: 98.1 F (36.7 C)   SpO2: 99% 97%   Vitals:   01/20/23 1449 01/20/23 2119 01/21/23 0632 01/21/23 0850  BP:  (!) 143/68 121/75   Pulse:  98 99   Resp:  18 18   Temp:  98 F (36.7 C) 98.1 F (36.7 C)   TempSrc:  Oral Oral   SpO2: (!) 85% 97% 99% 97%  Weight:      Height:        General: Pt is alert, awake, not in acute distress Cardiovascular: RRR, S1/S2 +, no rubs, no gallops Respiratory: CTA bilaterally, no wheezing, no rhonchi Abdominal: Soft, NT, ND, bowel sounds + Extremities: no edema, no cyanosis    The results of significant diagnostics from this hospitalization (including imaging, microbiology, ancillary and laboratory) are listed below for reference.     Microbiology: Recent Results (from the past 240 hour(s))  MRSA Next Gen by PCR, Nasal     Status: None   Collection Time: 01/15/23  3:46 PM   Specimen: Nasal Mucosa; Nasal Swab  Result Value Ref Range Status   MRSA by PCR  Next Gen NOT DETECTED NOT DETECTED Final    Comment: (NOTE) The GeneXpert MRSA Assay (FDA approved for NASAL specimens only), is one component of a comprehensive MRSA colonization surveillance program. It is not intended to diagnose MRSA infection nor to guide or monitor treatment for MRSA infections. Test performance is not FDA approved in patients less than 36 years old. Performed at [facility], [address] [place], [place] [address]      Labs: BNP (last 3 results) Recent Labs    01/11/23 1011 01/15/23 0554  BNP 193.4* 446.5*   Basic Metabolic Panel: Recent Labs  Lab 01/17/23 0540 01/18/23 0538 01/19/23 1130 01/20/23 0500 01/21/23 0625  NA 132* 131* 128* 129* 128*  K 4.4 4.9 4.5 4.8 4.3  CL 101 102 99 99 101  CO2 20* 22 23 22  20*  GLUCOSE 131* 108* 111* 115* 99  BUN 30* 29* 24* 18 15  CREATININE 0.88 0.76 0.68 0.74 0.53  CALCIUM 7.7* 7.7* 7.5* 7.8* 7.3*  MG  --   --   --   --  2.0  PHOS  --   --   --   --  2.1*   Liver Function Tests: Recent Labs  Lab 01/17/23 0540 01/18/23 0538 01/19/23 1130 01/20/23 0500 01/21/23 0625  AST 309* 298* 273* 274* 215*  ALT 72* 77* 78* 82* 68*  ALKPHOS 265* 276* 303* 345* 301*  BILITOT 4.2* 3.5* 3.4* 3.7* 3.3*  PROT 5.6* 5.6* 5.5* 6.0* 5.2*  ALBUMIN 2.6* 2.6* 2.6* 2.8* 2.5*   No results for input(s): "LIPASE", "AMYLASE" in the last 168 hours. No results for input(s): "AMMONIA" in the last 168 hours. CBC: Recent Labs  Lab 01/17/23 0540 01/18/23 0538 01/19/23 1130 01/20/23 0500 01/21/23 0625  WBC 8.4 6.4 3.7* 4.1 4.0  NEUTROABS  --  5.1  --  2.8 2.5  HGB 9.2* 9.1* 8.8* 9.8* 8.6*  HCT 28.5* 27.8* 27.5* 29.5* 25.8*  MCV 98.6 97.5 98.9 97.4 97.4  PLT 24* 20* 37* 31* 23*   Cardiac Enzymes: No results  for input(s): "CKTOTAL", "CKMB", "CKMBINDEX", "TROPONINI" in the last 168 hours. BNP: Invalid input(s): "POCBNP" CBG: Recent Labs  Lab 01/18/23 1054  GLUCAP 92   D-Dimer No results for  input(s): "DDIMER" in the last 72 hours. Hgb A1c No results for input(s): "HGBA1C" in the last 72 hours. Lipid Profile No results for input(s): "CHOL", "HDL", "LDLCALC", "TRIG", "CHOLHDL", "LDLDIRECT" in the last 72 hours. Thyroid function studies No results for input(s): "TSH", "T4TOTAL", "T3FREE", "THYROIDAB" in the last 72 hours.  Invalid input(s): "FREET3" Anemia work up No results for input(s): "VITAMINB12", "FOLATE", "FERRITIN", "TIBC", "IRON", "RETICCTPCT" in the last 72 hours. Urinalysis    Component Value Date/Time   COLORURINE AMBER (A) 01/13/2023 2315   APPEARANCEUR CLEAR 01/13/2023 2315   LABSPEC 1.026 01/13/2023 2315   PHURINE 5.0 01/13/2023 2315   GLUCOSEU NEGATIVE 01/13/2023 2315   HGBUR NEGATIVE 01/13/2023 2315   BILIRUBINUR NEGATIVE 01/13/2023 2315   BILIRUBINUR n 05/15/2016 0902   KETONESUR NEGATIVE 01/13/2023 2315   PROTEINUR 30 (A) 01/13/2023 2315   UROBILINOGEN negative 05/15/2016 0902   NITRITE NEGATIVE 01/13/2023 2315   LEUKOCYTESUR MODERATE (A) 01/13/2023 2315   Sepsis Labs Recent Labs  Lab 01/18/23 0538 01/19/23 1130 01/20/23 0500 01/21/23 0625  WBC 6.4 3.7* 4.1 4.0   Microbiology Recent Results (from the past 240 hour(s))  MRSA Next Gen by PCR, Nasal     Status: None   Collection Time: 01/15/23  3:46 PM   Specimen: Nasal Mucosa; Nasal Swab  Result Value Ref Range Status   MRSA by PCR Next Gen NOT DETECTED NOT DETECTED Final    Comment: (NOTE) The GeneXpert MRSA Assay (FDA approved for NASAL specimens only), is one component of a comprehensive MRSA colonization surveillance program. It is not intended to diagnose MRSA infection nor to guide or monitor treatment for MRSA infections. Test performance is not FDA approved in patients less than 81 years old. Performed at [facility], [address] [place], [place] [address]      Time coordinating discharge: Over 30 minutes  SIGNED:   Willeen Niece, MD  [facility] 01/21/2023, 12:35 PM Pager   If 7PM-7AM, please contact night-coverage

## 2023-01-22 ENCOUNTER — Encounter: Payer: Self-pay | Admitting: Hematology and Oncology

## 2023-01-22 ENCOUNTER — Ambulatory Visit: Payer: Medicare HMO | Admitting: Hematology and Oncology

## 2023-01-22 ENCOUNTER — Other Ambulatory Visit (HOSPITAL_BASED_OUTPATIENT_CLINIC_OR_DEPARTMENT_OTHER): Payer: Self-pay

## 2023-01-22 ENCOUNTER — Other Ambulatory Visit: Payer: Medicare HMO

## 2023-01-22 ENCOUNTER — Other Ambulatory Visit: Payer: Self-pay | Admitting: Family Medicine

## 2023-01-22 MED ORDER — SODIUM CHLORIDE 1 G PO TABS: 1.0000 g | ORAL_TABLET | Freq: Three times a day (TID) | ORAL | 1 refills | Status: DC

## 2023-01-25 NOTE — Progress Notes (Signed)
Patient Care Team: Carylon Perches, MD as PCP - General (Internal Medicine) Almond Lint, MD as Consulting Physician (General Surgery) Lonie Peak, MD as Attending Physician (Radiation Oncology) Patton Salles, MD as Consulting Physician (Obstetrics and Gynecology) Venancio Poisson, MD as Consulting Physician (Dermatology) Crist Fat, MD as Consulting Physician (Urology) Charna Elizabeth, MD as Consulting Physician (Gastroenterology) Sheral Apley, MD as Attending Physician (Orthopedic Surgery) Serena Croissant, MD as Consulting Physician (Hematology and Oncology)  DIAGNOSIS:  Encounter Diagnoses  Name Primary?   Malignant neoplasm of lower-inner quadrant of right breast of female, estrogen receptor positive (HCC) Yes   Cancer, metastatic to bone (HCC)     SUMMARY OF ONCOLOGIC HISTORY: Oncology History  Malignant neoplasm of lower-inner quadrant of right breast of female, estrogen receptor positive (HCC)  07/03/2019 Initial Diagnosis   right lower inner quadrant biopsy, for a clinicaly multiple T1c N0, stage IA invasive ductal carcinoma, grade 2, E-cadherin positive, strongly estrogen receptor positive but progesterone receptor negative and HER-2 not amplified, with an MIB-1 of 5%             (a) breast MRI 08/07/2019 showed 2 additional suspicious areas in the right breast and 1 in the left breast              (b) biopsy of all 3 areas 08/21/2019 showed no malignancy   07/26/2019 -  Anti-estrogen oral therapy   tamoxifen started neoadjuvantly 07/26/2019; to be continued for 10 years.  S/p TAH/BSO on 01/16/2020 with benign pathology   08/04/2019 Genetic Testing   Negative genetic testing:  No pathogenic variants detected on the [facility] Breast Cancer STAT panel or the Common Hereditary Cancers panel. The report date is 08/04/2019.  The STAT Breast cancer panel offered by [facility] includes sequencing and rearrangement analysis for the following 9 genes:  ATM, BRCA1, BRCA2,  CDH1, CHEK2, PALB2, PTEN, STK11 and TP53.  The Common Hereditary Cancers Panel offered by [facility] includes sequencing and/or deletion duplication testing of the following 48 genes: APC, ATM, AXIN2, BARD1, BMPR1A, BRCA1, BRCA2, BRIP1, CDH1, CDK4, CDKN2A (p14ARF), CDKN2A (p16INK4a), CHEK2, CTNNA1, DICER1, EPCAM (Deletion/duplication testing only), GREM1 (promoter region deletion/duplication testing only), KIT, MEN1, MLH1, MSH2, MSH3, MSH6, MUTYH, NBN, NF1, NHTL1, PALB2, PDGFRA, PMS2, POLD1, POLE, PTEN, RAD50, RAD51C, RAD51D, RNF43, SDHB, SDHC, SDHD, SMAD4, SMARCA4. STK11, TP53, TSC1, TSC2, and VHL.  The following genes were evaluated for sequence changes only: SDHA and HOXB13 c.251G>A variant only.    10/04/2019 Cancer Staging   Staging form: Breast, AJCC 8th Edition - Pathologic stage from 10/04/2019: Stage IIB (pT2, pN1a(sn), cM0, G2, ER+, PR-, HER2-) - Signed by Loa Socks, NP on 10/18/2019   10/04/2019 Surgery    right lumpectomy and sentinel lymph node sampling 10/04/2019 showed a pT2 pN1, stage IIB invasive ductal carcinoma, grade 2, with positive lymphovascular invasion and a positive superior margin             (a) 3 sentinel lymph nodes removed, one with macro metastatic deposit, 1 with a micrometastatic deposit             (b) additional surgery for margin clearance   10/04/2019 Miscellaneous    MammaPrint on the 10/04/2019 sample shows a low risk luminal A tumor predicting a 5-year metastasis free survival of 96% without chemotherapy, and a chemotherapy benefit of less than 1.5%    11/27/2019 - 01/05/2020 Radiation Therapy    Site Technique Total Dose (Gy) Dose per Fx (Gy) Completed Fx Beam Energies  Breast, Right: Breast_Rt 3D 50/50 2 25/25 6X, 10X  Breast, Right: Breast_Rt_SCV_PAB 3D 50/50 2 25/25 6X, 10X  Breast, Right: Breast_Rt_Bst 3D 10/10 2 5/5 6X, 10X    09/29/2022 Imaging   IMPRESSION: 1. Surgical changes involving the right medial breast. No obvious recurrent breast mass  or chest wall mass. 2. Mediastinal and hilar lymphadenopathy suggesting metastatic disease. 3. Patchy tree-in-bud type nodularity in the lungs suggesting chronic inflammation or atypical infection such as MAC. No definite pulmonary metastatic nodules. 4. Large partially necrotic mass involving most of the right hepatic lobe and a 2.5 cm lesion in segment 3 of the liver consistent with metastatic disease. 5. 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   10/06/2022 Initial Biopsy   Liver biopsy: + for malignancy, metastatic poorly differentiated carcinoma, likely breast origin, biomarkers pending, insufficient tissue for molecular testing. (Resulted in EPIC on 10/13/2022)   10/07/2022 PET scan   PET scan on 10/07/2022 that showed hypermetabolic metastatic breast cancer with hypermetabolic mediastinal and hilar adenopathy, hypermetabolic hepatic metastatic disease and bone disease.  There were no findings for pulmonary metastatic disease.   10/21/2022 -  Anti-estrogen oral therapy   Verzinio and Letrozole   01/15/2023 -  Chemotherapy   Patient is on Treatment Plan : BREAST METASTATIC Fam-Trastuzumab Deruxtecan-nxki (Enhertu) (5.4) q21d     Cancer, metastatic to bone (HCC)  12/21/2022 Initial Diagnosis   Cancer, metastatic to bone (HCC)   01/15/2023 -  Chemotherapy   Patient is on Treatment Plan : BREAST METASTATIC Fam-Trastuzumab Deruxtecan-nxki (Enhertu) (5.4) q21d       CHIEF COMPLIANT: Follow-up on cycle 2 Enhertu  INTERVAL HISTORY: Theresa [name] is a  67 y.o female with the above-mentioned Estrogen receptor positive HER2 low metastatic breast cancer. Currently on Enhertu and today cycle 2. She presents to the clinic for a follow-up. She reports that she is having trouble with sleeping. Appetite has got better in the last couple of days. Both feet are swollen. Stomach was bloated last night. Her husband reports that she is able to get around the house  and her nutrition has slightly improved over the past week or so.  ALLERGIES:  is allergic to metronidazole, other, and tape.  MEDICATIONS:  Current Outpatient Medications  Medication Sig Dispense Refill   dexamethasone (DECADRON) 4 MG tablet Take 1 tablet (4 mg total) by mouth daily. Take 1 tablet day chemotherapy 1 tablet 2 days after chemo with food 30 tablet 0   famotidine (PEPCID) 10 MG tablet Take 10 mg by mouth 2 (two) times daily.     furosemide (LASIX) 20 MG tablet Take 1 tablet (20 mg total) by mouth daily with breakfast. 30 tablet 0   gabapentin (NEURONTIN) 300 MG capsule Take 1 capsule (300 mg total) by mouth at bedtime. 90 capsule 4   guaiFENesin (MUCINEX) 600 MG 12 hr tablet Take 600 mg by mouth 2 (two) times daily.     ipratropium (ATROVENT) 0.02 % nebulizer solution Take 2.5 mLs (0.5 mg total) by nebulization 2 (two) times daily. 75 mL 12   ondansetron (ZOFRAN) 8 MG tablet Take 1 tablet (8 mg total) by mouth every 8 (eight) hours as needed for nausea or vomiting. 30 tablet 2   oxyCODONE-acetaminophen (PERCOCET/ROXICET) 5-325 MG tablet Take 1 tablet by mouth every 6 (six) hours as needed for severe pain. 30 tablet 0   prochlorperazine (COMPAZINE) 10 MG tablet Take 1 tablet (10 mg total) by mouth every 6 (six) hours  as needed for nausea or vomiting. 30 tablet 1   sodium chloride 1 g tablet Take 1 tablet (1 g total) by mouth 3 (three) times daily with meals. 30 tablet 1   valsartan (DIOVAN) 40 MG tablet Take 40 mg by mouth at bedtime.     venlafaxine XR (EFFEXOR-XR) 37.5 MG 24 hr capsule Take 1 capsule (37.5 mg total) by mouth daily. 90 capsule 4   LORazepam (ATIVAN) 0.5 MG tablet Take 1 tablet (0.5 mg total) by mouth every 8 (eight) hours as needed. for anxiety 15 tablet 3   No current facility-administered medications for this visit.    PHYSICAL EXAMINATION: ECOG PERFORMANCE STATUS: 3 - Symptomatic, >50% confined to bed  Vitals:   02/05/23 1036  BP: 128/71  Pulse: (!) 110   Resp: 18  Temp: 97.7 F (36.5 C)  SpO2: 100%   Filed Weights   02/05/23 1036  Weight: 139 lb 7 oz (63.2 kg)      LABORATORY DATA:  I have reviewed the data as listed    Latest Ref Rng & Units 02/05/2023   10:17 AM 01/26/2023   11:08 AM 01/21/2023    6:25 AM  CMP  Glucose 70 - 99 mg/dL 829  562  99   BUN 8 - 23 mg/dL 10  8  15    Creatinine 0.44 - 1.00 mg/dL 1.30  8.65  7.84   Sodium 135 - 145 mmol/L 135  133  128   Potassium 3.5 - 5.1 mmol/L 4.3  4.6  4.3   Chloride 98 - 111 mmol/L 104  102  101   CO2 22 - 32 mmol/L 24  25  20    Calcium 8.9 - 10.3 mg/dL 8.2  7.9  7.3   Total Protein 6.5 - 8.1 g/dL 5.6  5.5  5.2   Total Bilirubin 0.3 - 1.2 mg/dL 2.5  2.8  3.3   Alkaline Phos 38 - 126 U/L 358  398  301   AST 15 - 41 U/L 134  171  215   ALT 0 - 44 U/L 38  60  68     Lab Results  Component Value Date   WBC 5.8 02/05/2023   HGB 9.8 (L) 02/05/2023   HCT 30.1 (L) 02/05/2023   MCV 106.4 (H) 02/05/2023   PLT 52 (L) 02/05/2023   NEUTROABS 3.5 02/05/2023    ASSESSMENT & PLAN:  Malignant neoplasm of lower-inner quadrant of right breast of female, estrogen receptor positive (HCC) 07/03/2019: Right breast LIQ T1CN0 stage Ia grade 2 IDC ER 95% PR negative HER2 negative Ki-67 5% 10/04/2019: Right lumpectomy: T2N1 stage IIb grade 2 IDC positive lymphovascular invasion 3 lymph nodes removed 1 with macro N1 micrometastatic disease 10/04/2019: MammaPrint: Low risk 01/05/2020: Completed adjuvant radiation 08/04/2019: Genetics: Negative November 2020: Tamoxifen ------------------------------------------------------------------- 09/28/2022: Patient presented with platelets 65 INR 1.9 elevated LFTs and hypercalcemia  CT CAP 09/29/2022: Mediastinal and hilar lymphadenopathy suggestive of metastatic disease.  Patchy tree-in-bud nodularity in the lungs, partially necrotic mass right hepatic lobe 12 cm, also 2.5 cm lesion lytic bone metastases in spine pelvis and left scapula   Treatment plan: Liver  biopsy: Poorly diff cancer ER 30-40%, PR:0%, Her 2 Neg Verzinio with AI started 10/24/2022 discontinued May 2024 due to progression in the liver 3.  Bone Marrow Biopsy: 10/13/22: Positive for met breast cancer:   4.  Hospitalization 01/11/2023-01/21/2023: Severe jaundice due to progression in the liver 5.  Perative radiation to the spine  -------------------------------------------------------------------------------------------------------------------- Current treatment:  Enhertu cycle 2 (started 01/15/2023) Enhertu toxicities: None   Lab review:  01/26/2023: Hemoglobin 9.3, platelets 35, AST 171, ALT 60, alkaline phosphatase 398, total bilirubin 2.8 (improved from 5) 02/05/2023: Hemoglobin 9.8, platelets 52, AST 134, ALT 38, bilirubin 2.5, alkaline phosphatase 358, albumin 3.1   Appetite and energy levels are slowly improving with time.  Home oxygen: Requires 2 L/min Patient has had tachycardia with minimal activity and exertion.  Return to clinic in 3 weeks for cycle 3    No orders of the defined types were placed in this encounter.  The patient has a good understanding of the overall plan. she agrees with it. she will call with any problems that may develop before the next visit here. Total time spent: 30 mins including face to face time and time spent for planning, charting and co-ordination of care   Tamsen Meek, MD 02/05/23    I Janan Ridge am acting as a Neurosurgeon for [employer]  I have reviewed the above documentation for accuracy and completeness, and I agree with the above.

## 2023-01-26 ENCOUNTER — Inpatient Hospital Stay (HOSPITAL_BASED_OUTPATIENT_CLINIC_OR_DEPARTMENT_OTHER): Payer: Medicare HMO | Admitting: Hematology and Oncology

## 2023-01-26 ENCOUNTER — Inpatient Hospital Stay: Payer: Medicare HMO | Attending: Hematology and Oncology

## 2023-01-26 ENCOUNTER — Inpatient Hospital Stay: Payer: Medicare HMO

## 2023-01-26 VITALS — BP 123/62 | HR 109 | Temp 97.8°F | Resp 19 | Ht 65.0 in | Wt 141.0 lb

## 2023-01-26 DIAGNOSIS — Z170 Estrogen receptor positive status [ER+]: Secondary | ICD-10-CM | POA: Diagnosis not present

## 2023-01-26 DIAGNOSIS — C419 Malignant neoplasm of bone and articular cartilage, unspecified: Secondary | ICD-10-CM

## 2023-01-26 DIAGNOSIS — Z79811 Long term (current) use of aromatase inhibitors: Secondary | ICD-10-CM | POA: Insufficient documentation

## 2023-01-26 DIAGNOSIS — C50311 Malignant neoplasm of lower-inner quadrant of right female breast: Secondary | ICD-10-CM

## 2023-01-26 DIAGNOSIS — Z79899 Other long term (current) drug therapy: Secondary | ICD-10-CM | POA: Diagnosis not present

## 2023-01-26 DIAGNOSIS — C787 Secondary malignant neoplasm of liver and intrahepatic bile duct: Secondary | ICD-10-CM | POA: Diagnosis present

## 2023-01-26 DIAGNOSIS — C7951 Secondary malignant neoplasm of bone: Secondary | ICD-10-CM | POA: Diagnosis present

## 2023-01-26 DIAGNOSIS — Z7952 Long term (current) use of systemic steroids: Secondary | ICD-10-CM | POA: Insufficient documentation

## 2023-01-26 LAB — CMP (CANCER CENTER ONLY)
ALT: 60 U/L — ABNORMAL HIGH (ref 0–44)
AST: 171 U/L (ref 15–41)
Albumin: 3.2 g/dL — ABNORMAL LOW (ref 3.5–5.0)
Alkaline Phosphatase: 398 U/L — ABNORMAL HIGH (ref 38–126)
Anion gap: 6 (ref 5–15)
BUN: 8 mg/dL (ref 8–23)
CO2: 25 mmol/L (ref 22–32)
Calcium: 7.9 mg/dL — ABNORMAL LOW (ref 8.9–10.3)
Chloride: 102 mmol/L (ref 98–111)
Creatinine: 0.65 mg/dL (ref 0.44–1.00)
GFR, Estimated: >60 mL/min (ref >60)
Glucose, Bld: 150 mg/dL — ABNORMAL HIGH (ref 70–99)
Potassium: 4.6 mmol/L (ref 3.5–5.1)
Sodium: 133 mmol/L — ABNORMAL LOW (ref 135–145)
Total Bilirubin: 2.8 mg/dL — ABNORMAL HIGH (ref 0.3–1.2)
Total Protein: 5.5 g/dL — ABNORMAL LOW (ref 6.5–8.1)

## 2023-01-26 LAB — CBC WITH DIFFERENTIAL (CANCER CENTER ONLY)
Abs Immature Granulocytes: 0.2 10*3/uL — ABNORMAL HIGH (ref 0.00–0.07)
Basophils Absolute: 0.1 10*3/uL (ref 0.0–0.1)
Basophils Relative: 1 %
Eosinophils Absolute: 0.1 10*3/uL (ref 0.0–0.5)
Eosinophils Relative: 1 %
HCT: 28.6 % — ABNORMAL LOW (ref 36.0–46.0)
Hemoglobin: 9.3 g/dL — ABNORMAL LOW (ref 12.0–15.0)
Immature Granulocytes: 4 %
Lymphocytes Relative: 20 %
Lymphs Abs: 1 10*3/uL (ref 0.7–4.0)
MCH: 32.9 pg (ref 26.0–34.0)
MCHC: 32.5 g/dL (ref 30.0–36.0)
MCV: 101.1 fL — ABNORMAL HIGH (ref 80.0–100.0)
Monocytes Absolute: 0.7 10*3/uL (ref 0.1–1.0)
Monocytes Relative: 14 %
Neutro Abs: 2.9 10*3/uL (ref 1.7–7.7)
Neutrophils Relative %: 60 %
Platelet Count: 35 10*3/uL — ABNORMAL LOW (ref 150–400)
RBC: 2.83 MIL/uL — ABNORMAL LOW (ref 3.87–5.11)
RDW: 22.3 % — ABNORMAL HIGH (ref 11.5–15.5)
WBC Count: 4.9 10*3/uL (ref 4.0–10.5)
nRBC: 8 % — ABNORMAL HIGH (ref 0.0–0.2)

## 2023-01-26 MED ORDER — HEPARIN SOD (PORK) LOCK FLUSH 100 UNIT/ML IV SOLN
500.0000 [IU] | Freq: Once | INTRAVENOUS | Status: AC
  Administered 2023-01-26: 500 [IU]

## 2023-01-26 MED ORDER — SODIUM CHLORIDE 0.9% FLUSH
10.0000 mL | Freq: Once | INTRAVENOUS | Status: AC
  Administered 2023-01-26: 10 mL

## 2023-01-26 NOTE — Progress Notes (Signed)
CRITICAL VALUE STICKER  CRITICAL VALUE: AST [id number]  RECEIVER (on-site recipient of call): Murriel Hopper, RN  DATE & TIME NOTIFIED: 01/26/23 at 1204  MD NOTIFIED: Serena Croissant  TIME OF NOTIFICATION: 1205  RESPONSE: No new orders at this time

## 2023-01-26 NOTE — Assessment & Plan Note (Signed)
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  Patchy tree-in-bud nodularity in the lungs, partially necrotic mass right hepatic lobe 12 cm, also 2.5 cm lesion lytic bone metastases in spine pelvis and left scapula   Treatment plan: Liver biopsy: Poorly diff cancer ER 30-40%, PR:0%, Her 2 Neg Verzinio with AI started 10/24/2022 discontinued May 2024 due to progression in the liver 3.  Bone Marrow Biopsy: 10/13/22: Positive for met breast cancer:   4.  Hospitalization 01/11/2023-01/21/2023: Severe jaundice due to progression in the liver 5.  Perative radiation to the spine  -------------------------------------------------------------------------------------------------------------------- Current treatment: Enhertu cycle 1 started 01/15/2023 Enhertu toxicities: None  Lab review:  Profound generalized weakness Return to clinic on 02/05/2023 for cycle 2

## 2023-01-26 NOTE — Progress Notes (Signed)
Pt. Came in for labs and port flush.  Port side is bright red and warm to touch.  Informed Dr. Pamelia Hoit and Theora Gianotti.  Scheduled pt. For labs to be drawn peripheral

## 2023-01-28 ENCOUNTER — Other Ambulatory Visit (HOSPITAL_COMMUNITY): Payer: Self-pay

## 2023-01-29 ENCOUNTER — Other Ambulatory Visit: Payer: Self-pay

## 2023-01-29 ENCOUNTER — Encounter: Payer: Self-pay | Admitting: Hematology and Oncology

## 2023-01-29 MED ORDER — FUROSEMIDE 20 MG PO TABS: 20.0000 mg | ORAL_TABLET | Freq: Every day | ORAL | 0 refills | Status: DC

## 2023-01-29 NOTE — Progress Notes (Signed)
Lasix rx ordered per MD for edema reported by Pt. See MyChart message.

## 2023-02-01 ENCOUNTER — Other Ambulatory Visit (HOSPITAL_COMMUNITY): Payer: Self-pay

## 2023-02-01 ENCOUNTER — Other Ambulatory Visit: Payer: Self-pay | Admitting: *Deleted

## 2023-02-01 ENCOUNTER — Telehealth: Payer: Self-pay | Admitting: Hematology and Oncology

## 2023-02-01 DIAGNOSIS — R0602 Shortness of breath: Secondary | ICD-10-CM

## 2023-02-01 DIAGNOSIS — C50311 Malignant neoplasm of lower-inner quadrant of right female breast: Secondary | ICD-10-CM

## 2023-02-01 DIAGNOSIS — C7951 Secondary malignant neoplasm of bone: Secondary | ICD-10-CM

## 2023-02-01 DIAGNOSIS — C419 Malignant neoplasm of bone and articular cartilage, unspecified: Secondary | ICD-10-CM

## 2023-02-01 NOTE — Progress Notes (Signed)
Pt requesting order for portable oxygen concentrator.  RN successfully faxed [phone]) order to [facility] in [facility] [phone]). Pt notified of faxed order and verbalized understanding.

## 2023-02-01 NOTE — Telephone Encounter (Signed)
Scheduled appointments per WQ. Patient is aware of the made appointments.  

## 2023-02-02 ENCOUNTER — Encounter: Payer: Self-pay | Admitting: *Deleted

## 2023-02-02 ENCOUNTER — Ambulatory Visit: Payer: Medicare HMO | Admitting: Pharmacist

## 2023-02-02 ENCOUNTER — Other Ambulatory Visit: Payer: Medicare HMO

## 2023-02-02 NOTE — Progress Notes (Signed)
Received call from [facility] regarding portable oxygen concentrator for pt.  HH states patient needs to be established with them for 1 month prior to them being able to supply the light weight portable concentrator.  As of right now the patient has been with them for two weeks. The representative did say she would reach out to pt to offer smaller tanks until pt reaches the 4 week time frame.  Pt notified of this and verbalized understanding.

## 2023-02-02 NOTE — Radiation Completion Notes (Signed)
Patient Name: PANHIA, KREWSON MRN: [id number] Date of Birth: 1956-01-07 Referring Physician: Serena Croissant, M.D. Date of Service: 2023-02-02 Radiation Oncologist: Lonie Peak, M.D. [facility]                             RADIATION ONCOLOGY END OF TREATMENT NOTE     Diagnosis: C79.51 Secondary malignant neoplasm of bone Staging on 2019-10-04: Malignant neoplasm of lower-inner quadrant of right breast of female, estrogen receptor positive (HCC) T=pT2, N=pN1a, M=cM0 Staging on 2019-07-26: Malignant neoplasm of lower-inner quadrant of right breast of female, estrogen receptor positive (HCC) T=cT1c, N=cN0, M=cM0 Intent: Palliative     ==========DELIVERED PLANS==========  First Treatment Date: 2023-01-06 - Last Treatment Date: 2023-01-20   Plan Name: Spine_T Site: Thoracic Spine Technique: 3D Mode: Photon Dose Per Fraction: 3 Gy Prescribed Dose (Delivered / Prescribed): 30 Gy / 30 Gy Prescribed Fxs (Delivered / Prescribed): 10 / 10     ==========ON TREATMENT VISIT DATES========== 2023-01-12, 2023-01-18     ==========UPCOMING VISITS==========       ==========APPENDIX - ON TREATMENT VISIT NOTES==========   See weekly On Treatment Notes in Epic for details.

## 2023-02-02 NOTE — Progress Notes (Signed)
Received call from [facility] [phone]) with [facility] requesting adjustment on pt HR parameter for therapy tx.  Jeannett Senior states pt HR ranges from 100-110.  Per MD okay to treat with gait training with HR up to 120.  Jeannett Senior verbalized understanding and appreciative of advice.

## 2023-02-03 ENCOUNTER — Encounter: Payer: Self-pay | Admitting: Family Medicine

## 2023-02-04 MED FILL — Fosaprepitant Dimeglumine For IV Infusion 150 MG (Base Eq): INTRAVENOUS | Qty: 5 | Status: AC

## 2023-02-04 MED FILL — Dexamethasone Sodium Phosphate Inj 100 MG/10ML: INTRAMUSCULAR | Qty: 1 | Status: AC

## 2023-02-05 ENCOUNTER — Inpatient Hospital Stay: Payer: Medicare HMO | Admitting: Dietician

## 2023-02-05 ENCOUNTER — Inpatient Hospital Stay: Payer: Medicare HMO

## 2023-02-05 ENCOUNTER — Inpatient Hospital Stay: Payer: Medicare HMO | Attending: Hematology and Oncology | Admitting: Hematology and Oncology

## 2023-02-05 VITALS — HR 100

## 2023-02-05 VITALS — BP 128/71 | HR 110 | Temp 97.7°F | Resp 18 | Ht 65.0 in | Wt 139.4 lb

## 2023-02-05 DIAGNOSIS — Z90710 Acquired absence of both cervix and uterus: Secondary | ICD-10-CM | POA: Insufficient documentation

## 2023-02-05 DIAGNOSIS — C7951 Secondary malignant neoplasm of bone: Secondary | ICD-10-CM

## 2023-02-05 DIAGNOSIS — Z79899 Other long term (current) drug therapy: Secondary | ICD-10-CM | POA: Insufficient documentation

## 2023-02-05 DIAGNOSIS — Z7952 Long term (current) use of systemic steroids: Secondary | ICD-10-CM | POA: Insufficient documentation

## 2023-02-05 DIAGNOSIS — Z170 Estrogen receptor positive status [ER+]: Secondary | ICD-10-CM

## 2023-02-05 DIAGNOSIS — C50311 Malignant neoplasm of lower-inner quadrant of right female breast: Secondary | ICD-10-CM

## 2023-02-05 DIAGNOSIS — Z87891 Personal history of nicotine dependence: Secondary | ICD-10-CM | POA: Insufficient documentation

## 2023-02-05 DIAGNOSIS — Z5112 Encounter for antineoplastic immunotherapy: Secondary | ICD-10-CM | POA: Diagnosis present

## 2023-02-05 DIAGNOSIS — C419 Malignant neoplasm of bone and articular cartilage, unspecified: Secondary | ICD-10-CM

## 2023-02-05 DIAGNOSIS — Z923 Personal history of irradiation: Secondary | ICD-10-CM | POA: Diagnosis not present

## 2023-02-05 DIAGNOSIS — C787 Secondary malignant neoplasm of liver and intrahepatic bile duct: Secondary | ICD-10-CM | POA: Insufficient documentation

## 2023-02-05 LAB — CMP (CANCER CENTER ONLY)
ALT: 38 U/L (ref 0–44)
AST: 134 U/L — ABNORMAL HIGH (ref 15–41)
Albumin: 3.1 g/dL — ABNORMAL LOW (ref 3.5–5.0)
Alkaline Phosphatase: 358 U/L — ABNORMAL HIGH (ref 38–126)
Anion gap: 7 (ref 5–15)
BUN: 10 mg/dL (ref 8–23)
CO2: 24 mmol/L (ref 22–32)
Calcium: 8.2 mg/dL — ABNORMAL LOW (ref 8.9–10.3)
Chloride: 104 mmol/L (ref 98–111)
Creatinine: 0.67 mg/dL (ref 0.44–1.00)
GFR, Estimated: >60 mL/min (ref >60)
Glucose, Bld: 142 mg/dL — ABNORMAL HIGH (ref 70–99)
Potassium: 4.3 mmol/L (ref 3.5–5.1)
Sodium: 135 mmol/L (ref 135–145)
Total Bilirubin: 2.5 mg/dL — ABNORMAL HIGH (ref 0.3–1.2)
Total Protein: 5.6 g/dL — ABNORMAL LOW (ref 6.5–8.1)

## 2023-02-05 LAB — CBC WITH DIFFERENTIAL (CANCER CENTER ONLY)
Abs Immature Granulocytes: 0.28 10*3/uL — ABNORMAL HIGH (ref 0.00–0.07)
Basophils Absolute: 0.1 10*3/uL (ref 0.0–0.1)
Basophils Relative: 1 %
Eosinophils Absolute: 0.1 10*3/uL (ref 0.0–0.5)
Eosinophils Relative: 2 %
HCT: 30.1 % — ABNORMAL LOW (ref 36.0–46.0)
Hemoglobin: 9.8 g/dL — ABNORMAL LOW (ref 12.0–15.0)
Immature Granulocytes: 5 %
Lymphocytes Relative: 17 %
Lymphs Abs: 1 10*3/uL (ref 0.7–4.0)
MCH: 34.6 pg — ABNORMAL HIGH (ref 26.0–34.0)
MCHC: 32.6 g/dL (ref 30.0–36.0)
MCV: 106.4 fL — ABNORMAL HIGH (ref 80.0–100.0)
Monocytes Absolute: 0.9 10*3/uL (ref 0.1–1.0)
Monocytes Relative: 15 %
Neutro Abs: 3.5 10*3/uL (ref 1.7–7.7)
Neutrophils Relative %: 60 %
Platelet Count: 52 10*3/uL — ABNORMAL LOW (ref 150–400)
RBC: 2.83 MIL/uL — ABNORMAL LOW (ref 3.87–5.11)
RDW: 23.5 % — ABNORMAL HIGH (ref 11.5–15.5)
WBC Count: 5.8 10*3/uL (ref 4.0–10.5)
nRBC: 2.3 % — ABNORMAL HIGH (ref 0.0–0.2)

## 2023-02-05 MED ORDER — PALONOSETRON HCL INJECTION 0.25 MG/5ML
0.2500 mg | Freq: Once | INTRAVENOUS | Status: AC
  Administered 2023-02-05: 0.25 mg via INTRAVENOUS
  Filled 2023-02-05: qty 5

## 2023-02-05 MED ORDER — DEXTROSE 5 % IV SOLN: Freq: Once | INTRAVENOUS | Status: AC

## 2023-02-05 MED ORDER — HEPARIN SOD (PORK) LOCK FLUSH 100 UNIT/ML IV SOLN
500.0000 [IU] | Freq: Once | INTRAVENOUS | Status: AC | PRN
  Administered 2023-02-05: 500 [IU]

## 2023-02-05 MED ORDER — ACETAMINOPHEN 325 MG PO TABS
650.0000 mg | ORAL_TABLET | Freq: Once | ORAL | Status: AC
  Administered 2023-02-05: 650 mg via ORAL
  Filled 2023-02-05: qty 2

## 2023-02-05 MED ORDER — SODIUM CHLORIDE 0.9% FLUSH
10.0000 mL | Freq: Once | INTRAVENOUS | Status: AC
  Administered 2023-02-05: 10 mL

## 2023-02-05 MED ORDER — FAM-TRASTUZUMAB DERUXTECAN-NXKI CHEMO 100 MG IV SOLR
3.2000 mg/kg | Freq: Once | INTRAVENOUS | Status: AC
  Administered 2023-02-05: 200 mg via INTRAVENOUS
  Filled 2023-02-05: qty 10

## 2023-02-05 MED ORDER — SODIUM CHLORIDE 0.9% FLUSH
10.0000 mL | INTRAVENOUS | Status: DC | PRN
  Administered 2023-02-05: 10 mL

## 2023-02-05 MED ORDER — DIPHENHYDRAMINE HCL 25 MG PO CAPS
50.0000 mg | ORAL_CAPSULE | Freq: Once | ORAL | Status: AC
  Administered 2023-02-05: 50 mg via ORAL
  Filled 2023-02-05: qty 2

## 2023-02-05 MED ORDER — SODIUM CHLORIDE 0.9 % IV SOLN
10.0000 mg | Freq: Once | INTRAVENOUS | Status: AC
  Administered 2023-02-05: 10 mg via INTRAVENOUS
  Filled 2023-02-05: qty 10

## 2023-02-05 MED ORDER — LORAZEPAM 0.5 MG PO TABS: 0.5000 mg | ORAL_TABLET | Freq: Three times a day (TID) | ORAL | 3 refills | Status: DC | PRN

## 2023-02-05 MED ORDER — SODIUM CHLORIDE 0.9 % IV SOLN
150.0000 mg | Freq: Once | INTRAVENOUS | Status: AC
  Administered 2023-02-05: 150 mg via INTRAVENOUS
  Filled 2023-02-05: qty 150

## 2023-02-05 NOTE — Assessment & Plan Note (Signed)
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  Patchy tree-in-bud nodularity in the lungs, partially necrotic mass right hepatic lobe 12 cm, also 2.5 cm lesion lytic bone metastases in spine pelvis and left scapula   Treatment plan: Liver biopsy: Poorly diff cancer ER 30-40%, PR:0%, Her 2 Neg Verzinio with AI started 10/24/2022 discontinued May 2024 due to progression in the liver 3.  Bone Marrow Biopsy: 10/13/22: Positive for met breast cancer:   4.  Hospitalization 01/11/2023-01/21/2023: Severe jaundice due to progression in the liver 5.  Perative radiation to the spine  -------------------------------------------------------------------------------------------------------------------- Current treatment: Enhertu cycle 2 (started 01/15/2023) Enhertu toxicities: None   Lab review:  01/26/2023: Hemoglobin 9.3, platelets 35, AST 171, ALT 60, alkaline phosphatase 398, total bilirubin 2.8 (improved from 5)    Appetite and energy levels are slowly improving with time. Return to clinic in 3 weeks for cycle 3

## 2023-02-05 NOTE — Progress Notes (Signed)
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 She is currently receiving enhertu q21d (start 5/17). Patient is under the care of Dr. Pamelia Hoit.  Past medical history includes diverticular disease of colon, clostridial gastroenteritis, hepatomegaly, compression fracture of T9/T10  Met with patient and husband in infusion. Patient reports feeling tired. She sleeps through most of visit. Husband reports appetite has decreased, however this is slowly improving. Patient eating small amounts 3 times daily. Patient having altered taste. Husband states he is having a hard time getting her to eat protein. Reports patient is sensitive to smells. Patient is drinking Boost, but not regularly. She has HHPT coming twice weekly.    Nutrition Focused Physical Exam: deferred   Medications: verzenio, decadron, pepcid, lasix, gabapentin, mucinex, femara, ativan, zofran, compazine, diovan, effexor   Labs: glucose 142, albumin 3.1   Anthropometrics: Weights have decreased ~9% (13 lbs) from usual weight in 5 months - significant for time frame (noted lasix 20mg  daily for BLE swelling)  Height: 5'5" Weight: 139 lb 7 oz  UBW: 152 lb (January) BMI: 23.20    NUTRITION DIAGNOSIS: Unintended weight loss related to metastatic cancer and treatment side effects as evidenced by altered taste, decreased appetite, 9% wt loss in 5 months - significant     INTERVENTION:  Educated on strategies for poor appetite, encouraged small frequent meals/snacks every 2-3 hours Encouraged soft moist high protein foods for ease of intake - handout provided Discussed strategies for altered taste. Suggested baking soda salt water rinses several times daily and before meals Recommend 1-2 Boost Plus/equivalent daily  Pt receiving HH PT 2x/week  Contact information provided     MONITORING, EVALUATION, GOAL: Patient will tolerate increased  calories and protein to minimize further weight loss   Next Visit: Friday June 28 during infusion

## 2023-02-11 ENCOUNTER — Ambulatory Visit: Payer: Medicare HMO | Admitting: Primary Care

## 2023-02-11 ENCOUNTER — Encounter: Payer: Self-pay | Admitting: Primary Care

## 2023-02-11 VITALS — BP 122/68 | HR 103 | Temp 97.9°F | Ht 66.0 in | Wt 140.4 lb

## 2023-02-11 DIAGNOSIS — J9691 Respiratory failure, unspecified with hypoxia: Secondary | ICD-10-CM | POA: Diagnosis not present

## 2023-02-11 DIAGNOSIS — C50311 Malignant neoplasm of lower-inner quadrant of right female breast: Secondary | ICD-10-CM

## 2023-02-11 DIAGNOSIS — I272 Pulmonary hypertension, unspecified: Secondary | ICD-10-CM

## 2023-02-11 DIAGNOSIS — Z170 Estrogen receptor positive status [ER+]: Secondary | ICD-10-CM

## 2023-02-11 LAB — BASIC METABOLIC PANEL
BUN: 14 mg/dL (ref 6–23)
CO2: 28 mEq/L (ref 19–32)
Calcium: 8.7 mg/dL (ref 8.4–10.5)
Chloride: 96 mEq/L (ref 96–112)
Creatinine, Ser: 0.83 mg/dL (ref 0.40–1.20)
GFR: 73.64 mL/min (ref >60.00)
Glucose, Bld: 127 mg/dL — ABNORMAL HIGH (ref 70–99)
Potassium: 4.9 mEq/L (ref 3.5–5.1)
Sodium: 131 mEq/L — ABNORMAL LOW (ref 135–145)

## 2023-02-11 LAB — BRAIN NATRIURETIC PEPTIDE: Pro B Natriuretic peptide (BNP): 52 pg/mL (ref 0.0–100.0)

## 2023-02-11 MED ORDER — DOCUSATE SODIUM 100 MG PO CAPS: 100.0000 mg | ORAL_CAPSULE | Freq: Every day | ORAL | 2 refills | Status: DC

## 2023-02-11 MED ORDER — OMEPRAZOLE 20 MG PO CPDR: 20.0000 mg | DELAYED_RELEASE_CAPSULE | Freq: Two times a day (BID) | ORAL | 2 refills | Status: DC

## 2023-02-11 NOTE — Progress Notes (Signed)
@Patient  ID: Theresa [name], female    DOB: [birth year], 67 y.o.   MRN: [id number]  Chief Complaint  Patient presents with   Follow-up    Post-hosp. X 3 wks. Sob-better, denies cough/wheeze, c/o hoarseness    Referring provider: Carylon Perches, MD  HPI: 67 year old female, former smoker. PMH significant for pneumonia, hypoxic respiratory failure, hepatomegaly, breast cancer.  02/11/2023 Medical history significant for breast cancer (IDC of R breast, metastatic to bone/liver) s/p lumpectomy and XRT (12/2019) as well as tamoxifen. Patient has had persistent SOB since kyphoplasty, compounded by recent severe URI 5/3. Covid negative. She originally presented to [facility] on 5/13 with complaints of shortness of breath, productive cough and hypoxia.  Chest x-ray negative for acute process, CTA negative for PE, mild patchy groundglass bilaterally.  PCCM consulted for persistent shortness of breath and hypoxia.  Echocardiogram showed severe pulmonary hypertension with right to left intrapulmonary shunt, compared to echo in 2016. Diuresed 1.3 L with Lasix.   Patient presents today for hospital follow-up, she was discharged 3 weeks ago. She is doing alright today. Shortness of breath is better. She complains of voice hoarseness. She wears 2L oxygen, not currently using humidification because water was getting trapped in tubing. She does have reflux symptoms. Taking prilosec 20mg  OTC. She has leg and abdominal swelling. Compliant with lasix 20mg  daily. Some constipation. Not requiring pain medication.    Allergies  Allergen Reactions   Metronidazole Other (See Comments)    C-Diff C-Diff C-Diff   Other Other (See Comments) and Rash    Dermabond - redness and burning, blistering Dermabond - redness and burning, blistering Dermabond - redness and burning, blistering "burning" skin   Tape Rash    "burning" skin "burning" skin    Immunization History  Administered Date(s) Administered    Influenza, Quadrivalent, Recombinant, Inj, Pf 06/23/2019   Moderna Sars-Covid-2 Vaccination 09/12/2020    Past Medical History:  Diagnosis Date   Breast cancer, right (HCC)    Contact lens/glasses fitting    wears contacts or glasses   Diarrhea    --post gallbladder surgery   Family history of bone cancer    Family history of brain cancer    Family history of stomach cancer    Family history of uterine cancer    Fibroids    History of radiation therapy    Hypertension    No pertinent past medical history    Personal history of radiation therapy    PONV (postoperative nausea and vomiting)    Right ovarian cyst    Vertigo     Tobacco History: Social History   Tobacco Use  Smoking Status Former   Types: Cigarettes   Quit date: 03/15/1996   Years since quitting: 26.9  Smokeless Tobacco Never   Counseling given: Not Answered   Outpatient Medications Prior to Visit  Medication Sig Dispense Refill   dexamethasone (DECADRON) 4 MG tablet Take 1 tablet (4 mg total) by mouth daily. Take 1 tablet day chemotherapy 1 tablet 2 days after chemo with food 30 tablet 0   famotidine (PEPCID) 10 MG tablet Take 10 mg by mouth 2 (two) times daily.     furosemide (LASIX) 20 MG tablet Take 1 tablet (20 mg total) by mouth daily with breakfast. 30 tablet 0   gabapentin (NEURONTIN) 300 MG capsule Take 1 capsule (300 mg total) by mouth at bedtime. 90 capsule 4   LORazepam (ATIVAN) 0.5 MG tablet Take 1 tablet (0.5 mg total) by  mouth every 8 (eight) hours as needed. for anxiety 15 tablet 3   ondansetron (ZOFRAN) 8 MG tablet Take 1 tablet (8 mg total) by mouth every 8 (eight) hours as needed for nausea or vomiting. 30 tablet 2   venlafaxine XR (EFFEXOR-XR) 37.5 MG 24 hr capsule Take 1 capsule (37.5 mg total) by mouth daily. 90 capsule 4   guaiFENesin (MUCINEX) 600 MG 12 hr tablet Take 600 mg by mouth 2 (two) times daily. (Patient not taking: Reported on 02/11/2023)     ipratropium (ATROVENT) 0.02 %  nebulizer solution Take 2.5 mLs (0.5 mg total) by nebulization 2 (two) times daily. (Patient not taking: Reported on 02/11/2023) 75 mL 12   oxyCODONE-acetaminophen (PERCOCET/ROXICET) 5-325 MG tablet Take 1 tablet by mouth every 6 (six) hours as needed for severe pain. (Patient not taking: Reported on 02/11/2023) 30 tablet 0   prochlorperazine (COMPAZINE) 10 MG tablet Take 1 tablet (10 mg total) by mouth every 6 (six) hours as needed for nausea or vomiting. (Patient not taking: Reported on 02/11/2023) 30 tablet 1   sodium chloride 1 g tablet Take 1 tablet (1 g total) by mouth 3 (three) times daily with meals. (Patient not taking: Reported on 02/11/2023) 30 tablet 1   valsartan (DIOVAN) 40 MG tablet Take 40 mg by mouth at bedtime. (Patient not taking: Reported on 02/11/2023)     No facility-administered medications prior to visit.    Review of Systems  Review of Systems  Constitutional: Negative.   HENT:         Voice hoarseness   Respiratory:  Negative for shortness of breath.    Physical Exam  BP 122/68 (BP Location: Left Arm, Cuff Size: Normal)   Pulse (!) 103   Temp 97.9 F (36.6 C) (Temporal)   Ht 5\' 6"  (1.676 m)   Wt 140 lb 6.4 oz (63.7 kg)   LMP 09/01/2007 (Exact Date)   SpO2 100%   BMI 22.66 kg/m  Physical Exam Constitutional:      General: She is not in acute distress.    Appearance: Normal appearance. She is not ill-appearing.  HENT:     Head: Normocephalic and atraumatic.  Cardiovascular:     Rate and Rhythm: Regular rhythm. Tachycardia present.     Comments: +2-3BLE edema Pulmonary:     Effort: Pulmonary effort is normal.     Breath sounds: Normal breath sounds. No wheezing.  Abdominal:     General: There is distension.     Palpations: Abdomen is soft.  Musculoskeletal:        General: Normal range of motion.  Skin:    General: Skin is warm and dry.  Neurological:     General: No focal deficit present.     Mental Status: She is alert and oriented to person,  place, and time. Mental status is at baseline.  Psychiatric:        Mood and Affect: Mood normal.        Behavior: Behavior normal.        Thought Content: Thought content normal.        Judgment: Judgment normal.      Lab Results:  CBC    Component Value Date/Time   WBC 5.8 02/05/2023 1017   WBC 4.0 01/21/2023 0625   RBC 2.83 (L) 02/05/2023 1017   HGB 9.8 (L) 02/05/2023 1017   HGB 10.8 (L) 01/19/2020 0911   HGB 12.8 03/15/2013 1434   HCT 30.1 (L) 02/05/2023 1017   HCT 32.8 (  L) 01/19/2020 0911   PLT 52 (L) 02/05/2023 1017   PLT 216 01/19/2020 0911   MCV 106.4 (H) 02/05/2023 1017   MCV 94 01/19/2020 0911   MCH 34.6 (H) 02/05/2023 1017   MCHC 32.6 02/05/2023 1017   RDW 23.5 (H) 02/05/2023 1017   RDW 12.5 01/19/2020 0911   LYMPHSABS 1.0 02/05/2023 1017   LYMPHSABS 0.8 01/19/2020 0911   MONOABS 0.9 02/05/2023 1017   EOSABS 0.1 02/05/2023 1017   EOSABS 0.2 01/19/2020 0911   BASOSABS 0.1 02/05/2023 1017   BASOSABS 0.0 01/19/2020 0911    BMET    Component Value Date/Time   NA 131 (L) 02/11/2023 1212   K 4.9 02/11/2023 1212   CL 96 02/11/2023 1212   CO2 28 02/11/2023 1212   GLUCOSE 127 (H) 02/11/2023 1212   BUN 14 02/11/2023 1212   CREATININE 0.83 02/11/2023 1212   CREATININE 0.67 02/05/2023 1017   CALCIUM 8.7 02/11/2023 1212   GFRNONAA >60 02/05/2023 1017   GFRAA >60 03/08/2020 1001    BNP    Component Value Date/Time   BNP 446.5 (H) 01/15/2023 0554    ProBNP    Component Value Date/Time   PROBNP 52.0 02/11/2023 1212    Imaging: IR IMAGING GUIDED PORT INSERTION  Result Date: 01/18/2023 INDICATION: Chemotherapy.  History of metastatic breast cancer. EXAM: IMPLANTED PORT A CATH PLACEMENT WITH ULTRASOUND AND FLUOROSCOPIC GUIDANCE MEDICATIONS: None ANESTHESIA/SEDATION: Moderate (conscious) sedation was employed during this procedure. A total of Versed 2 mg and Fentanyl 100 mcg was administered intravenously. Moderate Sedation Time: 25 minutes. The patient's  level of consciousness and vital signs were monitored continuously by radiology nursing throughout the procedure under my direct supervision. FLUOROSCOPY TIME:  Fluoroscopic dose; 0 mGy COMPLICATIONS: None immediate. PROCEDURE: The procedure, risks, benefits, and alternatives were explained to the patient and/or patient's representative . Questions regarding the procedure were encouraged and answered. The patient and/or patient's representative understands and consents to the procedure. The RIGHT neck and chest were prepped with chlorhexidine in a sterile fashion, and a sterile drape was applied covering the operative field. Maximum barrier sterile technique with sterile gowns and gloves were used for the procedure. A timeout was performed prior to the initiation of the procedure. Local anesthesia was provided with 1% lidocaine with epinephrine. After creating a small venotomy incision, a micropuncture kit was utilized to access the internal jugular vein under direct, real-time ultrasound guidance. Ultrasound image documentation was performed. The microwire was kinked to measure appropriate catheter length. A subcutaneous port pocket was then created along the upper chest wall utilizing a combination of sharp and blunt dissection. The pocket was irrigated with sterile saline. A single lumen ISP power injectable port was chosen for placement. The 8 Fr catheter was tunneled from the port pocket site to the venotomy incision. The port was placed in the pocket. The external catheter was trimmed to appropriate length. At the venotomy, an 8 Fr peel-away sheath was placed over a guidewire under fluoroscopic guidance. The catheter was then placed through the sheath and the sheath was removed. Final catheter positioning was confirmed and documented with a fluoroscopic spot radiograph. The port was accessed with a Huber needle, aspirated and flushed with heparinized saline. The port pocket incision was closed with interrupted  3-0 Vicryl suture then Dermabond was applied, including at the venotomy incision. Dressings were placed. The patient tolerated the procedure well without immediate post procedural complication. IMPRESSION: Successful placement of a RIGHT internal jugular approach power injectable Port-A-Cath. The  tip of the catheter is positioned within the proximal RIGHT atrium. The catheter is ready for immediate use. Roanna Banning, MD [facility] Electronically Signed   By: Roanna Banning M.D.   On: 01/18/2023 16:58     Assessment & Plan:   Hypoxic respiratory failure (HCC) - Secondary to pulmonary hypertension. Patient was walked on POC and was able to maintain O2 >90% O2L. Ordered placed for patient to receive [employer] portable oxygen concentrator    Pulmonary hypertension (HCC) - BNP 400. Echo with normal EF. Pulmonary shunt, severely elevated pulmonary artery pressure - Continue lasix 20mg  daily, recheck BNP  - Referring to Dr. Judeth Horn for ongoing management   Malignant neoplasm of lower-inner quadrant of right breast of female, estrogen receptor positive (HCC) - S/p lumpectomy and XRT (12/2019) as well as tamoxifen - Following with oncology   Pneumonia - Admitted in May 2024 for respiratory failure. CTA negative for PE, mild patchy ground glass. bilaterally. Completed 5 day course of Azithromycin and 7 days of ceftriaxone. Clinically improved. Denies fever or cough.    Glenford Bayley, NP 02/15/2023

## 2023-02-11 NOTE — Patient Instructions (Addendum)
Call [phone])  Recommendations: Continue lasix 20mg  daily Colace 100mg  daily to prevent constipation and soften stool  Take miralax 17 grams daily as needed for constipation  Continue to wear 2L oxygen with moderate exertion and at bedtime   Orders: Labs (BNP and BMET)  Please place order for patient to change DME companies from [employer] to [employer] for home oxygen concentrator and POC at 2L with exertion [id number] respiratory failure   Follow-up: First available with Dr. Judeth Horn in 2-4 weeks (30 min visit- pulmonary HTN)

## 2023-02-12 ENCOUNTER — Telehealth: Payer: Self-pay | Admitting: Primary Care

## 2023-02-12 NOTE — Telephone Encounter (Signed)
PT husband calling about her O2 order. It may not have gone thru. I see notes like this: Waiting for completed note   Please check order to insure it transmitted to completion and contact Husband to advise: [phone]  Sales Person is [employer].[phone] is her fax and she will take care of it if we send it this way.

## 2023-02-15 DIAGNOSIS — I272 Pulmonary hypertension, unspecified: Secondary | ICD-10-CM | POA: Insufficient documentation

## 2023-02-15 NOTE — Assessment & Plan Note (Addendum)
-   Admitted in May 2024 for respiratory failure. CTA negative for PE, mild patchy ground glass. bilaterally. Completed 5 day course of Azithromycin and 7 days of ceftriaxone. Clinically improved. Denies fever or cough.

## 2023-02-15 NOTE — Assessment & Plan Note (Signed)
-   S/p lumpectomy and XRT (12/2019) as well as tamoxifen - Following with oncology

## 2023-02-15 NOTE — Telephone Encounter (Signed)
I have spoke with the husband Daryl letting him know Referral, note, 02 testing faxed to [facility] Fax # [phone] and [facility] fax # [phone] per the husband Jesusita Luckenbaugh

## 2023-02-15 NOTE — Assessment & Plan Note (Addendum)
-   BNP 400. Echo with normal EF. Pulmonary shunt, severely elevated pulmonary artery pressure - Continue lasix 20mg  daily, recheck BNP  - Referring to Dr. Judeth Horn for ongoing management

## 2023-02-15 NOTE — Assessment & Plan Note (Signed)
-   Secondary to pulmonary hypertension. Patient was walked on POC and was able to maintain O2 >90% O2L. Ordered placed for patient to receive [employer] portable oxygen concentrator

## 2023-02-17 ENCOUNTER — Other Ambulatory Visit: Payer: Self-pay | Admitting: *Deleted

## 2023-02-17 DIAGNOSIS — C50311 Malignant neoplasm of lower-inner quadrant of right female breast: Secondary | ICD-10-CM

## 2023-02-17 DIAGNOSIS — C7951 Secondary malignant neoplasm of bone: Secondary | ICD-10-CM

## 2023-02-17 NOTE — Progress Notes (Signed)
Received call from [facility] with [facility] home health PT [phone]) requesting advice from MD as to how often pt is to wear TLSO brace and if pt needs to have any back precautions. Per MD pt needing to be referred to sports medicine for further evaluation and precautions.  Per MD pt okay to continue with home health PT as tolerated to prevent pt from deconditioning at this time until pt is seen by Sports Medicine for further evaluation. Referral placed, RN attempt x1 to contact pt.  No answer, LVM.  RN also contacted Rayfield Citizen with [facility] PT who verbalized understanding of plan.

## 2023-02-18 ENCOUNTER — Telehealth: Payer: Self-pay | Admitting: *Deleted

## 2023-02-18 ENCOUNTER — Encounter: Payer: Self-pay | Admitting: Hematology and Oncology

## 2023-02-18 ENCOUNTER — Other Ambulatory Visit: Payer: Self-pay | Admitting: *Deleted

## 2023-02-18 DIAGNOSIS — C7951 Secondary malignant neoplasm of bone: Secondary | ICD-10-CM

## 2023-02-18 NOTE — Telephone Encounter (Signed)
Received mychart message from pt stating her HR is ranging from 107 while resting up to 130 with activity.  Pt also states she is experiencing abdominal distention and denies increased shortness of breath at this time.  Per MD pt needing labs and [facility] visit tomorrow.  Pt educated if symptoms get worse or she develops shortness of breath to seek care at [facility].  Pt verbalized understanding.

## 2023-02-19 ENCOUNTER — Inpatient Hospital Stay (HOSPITAL_BASED_OUTPATIENT_CLINIC_OR_DEPARTMENT_OTHER): Payer: Medicare HMO | Admitting: Adult Health

## 2023-02-19 ENCOUNTER — Inpatient Hospital Stay: Payer: Medicare HMO

## 2023-02-19 ENCOUNTER — Encounter: Payer: Self-pay | Admitting: Adult Health

## 2023-02-19 ENCOUNTER — Other Ambulatory Visit: Payer: Self-pay

## 2023-02-19 VITALS — BP 126/78 | HR 121 | Temp 97.9°F | Resp 18 | Wt 139.0 lb

## 2023-02-19 DIAGNOSIS — C50311 Malignant neoplasm of lower-inner quadrant of right female breast: Secondary | ICD-10-CM

## 2023-02-19 DIAGNOSIS — Z170 Estrogen receptor positive status [ER+]: Secondary | ICD-10-CM

## 2023-02-19 DIAGNOSIS — Z5112 Encounter for antineoplastic immunotherapy: Secondary | ICD-10-CM | POA: Diagnosis not present

## 2023-02-19 DIAGNOSIS — C419 Malignant neoplasm of bone and articular cartilage, unspecified: Secondary | ICD-10-CM

## 2023-02-19 DIAGNOSIS — R180 Malignant ascites: Secondary | ICD-10-CM

## 2023-02-19 LAB — CMP (CANCER CENTER ONLY)
ALT: 31 U/L (ref 0–44)
AST: 90 U/L — ABNORMAL HIGH (ref 15–41)
Albumin: 3 g/dL — ABNORMAL LOW (ref 3.5–5.0)
Alkaline Phosphatase: 277 U/L — ABNORMAL HIGH (ref 38–126)
Anion gap: 6 (ref 5–15)
BUN: 11 mg/dL (ref 8–23)
CO2: 24 mmol/L (ref 22–32)
Calcium: 8.1 mg/dL — ABNORMAL LOW (ref 8.9–10.3)
Chloride: 105 mmol/L (ref 98–111)
Creatinine: 0.56 mg/dL (ref 0.44–1.00)
GFR, Estimated: >60 mL/min (ref >60)
Glucose, Bld: 120 mg/dL — ABNORMAL HIGH (ref 70–99)
Potassium: 4.2 mmol/L (ref 3.5–5.1)
Sodium: 135 mmol/L (ref 135–145)
Total Bilirubin: 2 mg/dL — ABNORMAL HIGH (ref 0.3–1.2)
Total Protein: 5.2 g/dL — ABNORMAL LOW (ref 6.5–8.1)

## 2023-02-19 LAB — CBC WITH DIFFERENTIAL (CANCER CENTER ONLY)
Abs Immature Granulocytes: 0.04 10*3/uL (ref 0.00–0.07)
Basophils Absolute: 0 10*3/uL (ref 0.0–0.1)
Basophils Relative: 1 %
Eosinophils Absolute: 0.1 10*3/uL (ref 0.0–0.5)
Eosinophils Relative: 1 %
HCT: 27.8 % — ABNORMAL LOW (ref 36.0–46.0)
Hemoglobin: 9.1 g/dL — ABNORMAL LOW (ref 12.0–15.0)
Immature Granulocytes: 1 %
Lymphocytes Relative: 19 %
Lymphs Abs: 1.2 10*3/uL (ref 0.7–4.0)
MCH: 35.5 pg — ABNORMAL HIGH (ref 26.0–34.0)
MCHC: 32.7 g/dL (ref 30.0–36.0)
MCV: 108.6 fL — ABNORMAL HIGH (ref 80.0–100.0)
Monocytes Absolute: 0.6 10*3/uL (ref 0.1–1.0)
Monocytes Relative: 9 %
Neutro Abs: 4.2 10*3/uL (ref 1.7–7.7)
Neutrophils Relative %: 69 %
Platelet Count: 52 10*3/uL — ABNORMAL LOW (ref 150–400)
RBC: 2.56 MIL/uL — ABNORMAL LOW (ref 3.87–5.11)
RDW: 22.1 % — ABNORMAL HIGH (ref 11.5–15.5)
WBC Count: 6.1 10*3/uL (ref 4.0–10.5)
nRBC: 0.7 % — ABNORMAL HIGH (ref 0.0–0.2)

## 2023-02-19 MED ORDER — SPIRONOLACTONE 25 MG PO TABS
25.0000 mg | ORAL_TABLET | Freq: Every day | ORAL | 0 refills | Status: DC
Start: 2023-02-19 — End: 2023-03-15

## 2023-02-19 MED ORDER — SODIUM CHLORIDE 0.9% FLUSH
10.0000 mL | Freq: Once | INTRAVENOUS | Status: AC
  Administered 2023-02-19: 10 mL

## 2023-02-19 MED ORDER — HEPARIN SOD (PORK) LOCK FLUSH 100 UNIT/ML IV SOLN
500.0000 [IU] | Freq: Once | INTRAVENOUS | Status: AC
  Administered 2023-02-19: 500 [IU]

## 2023-02-19 NOTE — Assessment & Plan Note (Addendum)
Theresa [name] is a 67 year old woman with metastatic breast cancer who started treatment on Enhertu.  Metastatic breast cancer: She developed widespread liver metastases.  She was started on Enhertu a few weeks ago.  Since that time her liver enzymes and T. bili have trended downward.  This is an early sign that the treatment is working.  She will continue on Enhertu. Ascites: This will likely improve as her liver enzymes continue to improve.  I prescribed spironolactone 25 mg daily for her to take in hopes to help the fluid decrease as well.  She will return on Tuesday for lab only to ensure her sodium continues to be stable.  I recommended that she continue to abstain from taking the sodium tablet daily and will review her labs on Tuesday to see where that stands. Tachycardia: Her heart rate at rest is regular and within a normal range.  CT angiogram during her hospitalization is negative and this is not associated with any progressive hypoxia or shortness of breath.  This is likely related to deconditioning and I suggested that she lower her expectations and take it slower with the activities that she is working to do. Sleep pattern disturbance: I suggested that she cut the lorazepam pill in half.  I recommended that she and her husband get a pill cutter from the pharmacy to help with this. Constipation: I recommended she continue with stool softener and miralax daily and titrate based on her stool consistency.    Our goals are to help manage [place] symptoms so she can return on Friday and receive her next dose of Enhertu.  I gave them information in her after visit summary and we will see her back on Tuesday for lab only and on Friday for labs, follow-up with Dr. Pamelia Hoit, her next treatment

## 2023-02-19 NOTE — Patient Instructions (Signed)
Ascites  Ascites is a collection of too much fluid in the abdomen. Ascites can range from mild to severe. If ascites is not treated, it can get worse. What are the causes? This condition may be caused by: A liver condition called cirrhosis. This is the most common cause of ascites. Long-term (chronic) or alcoholic hepatitis. Infection or inflammation in the abdomen. Cancer in the abdomen. Heart failure. Kidney disease. Inflammation of the pancreas. Clots in the veins of the liver. What are the signs or symptoms? Symptoms of this condition include: A feeling of fullness in the abdomen. This is common. An increase in the size of the abdomen or waist. Swelling in the legs. Swelling of the scrotum. Difficulty breathing. Pain in the abdomen. Sudden weight gain. If the condition is mild, you may not have symptoms. How is this diagnosed? This condition is diagnosed based on your medical history and a physical exam. Your health care provider may order imaging tests, such as an ultrasound or CT scan of your abdomen. How is this treated? Treatment for this condition depends on the cause of the ascites. It may include: Taking a pill to make you urinate. This is called a water pill (diuretic pill). Strictly reducing your salt (sodium) intake. Salt can cause extra fluid to be kept (retained) in the body, and this makes ascites worse. Having a procedure to remove fluid from your abdomen (paracentesis). Having a procedure that connects two of the major veins within your liver and relieves pressure on your liver. This is called a TIPS procedure (transjugular intrahepatic portosystemic shunt procedure). Placement of a drainage catheter (peritoneovenous shunt) to manage the extra fluid in the abdomen. Ascites may go away or improve when the condition that caused it is treated. Follow these instructions at home: Eating and drinking Keep track of your weight. To do this, weigh yourself at the same  time every day and write down your weight. Try not to eat salty (high-sodium) foods. Follow any instructions that your health care provider gives you about how much to drink. Keep track of how much you drink and any changes in how much or how often you urinate. General instructions Report any changes in your health to your health care provider, especially if you develop new symptoms or your symptoms get worse. Take over-the-counter and prescription medicines only as told by your health care provider. Keep all follow-up visits. This is important. Contact a health care provider if: You gain more than 3 lb (1.36 kg) in 3 days. Your waist size increases. You have new swelling in your legs. The swelling in your legs gets worse. Get help right away if: You have a fever or chills. You are confused. You have new or worsening breathing trouble. You have new or worsening pain in your abdomen. You have new or worsening swelling in the scrotum. Summary Ascites is a collection of too much fluid in the abdomen. Ascites may be caused by various conditions, such as cirrhosis, hepatitis, cancer, or congestive heart failure. Symptoms may include swelling of the abdomen and other areas due to extra fluid in the body. Treatments may involve dietary changes, medicines, or procedures. This information is not intended to replace advice given to you by your health care provider. Make sure you discuss any questions you have with your health care provider. Document Revised: 04/30/2020 Document Reviewed: 04/30/2020 Elsevier Patient Education  2024 [employer].

## 2023-02-19 NOTE — Progress Notes (Signed)
Theresa [name]:    Theresa [name], Theresa [name] [address] [place] [address]   DIAGNOSIS:  Cancer Staging  Malignant neoplasm of lower-inner quadrant of right breast of female, estrogen receptor positive (HCC) Staging form: Breast, AJCC 8th Edition - Clinical stage from 07/26/2019: Stage IA (cT1c, cN0, cM0, G2, ER+, PR-, HER2-) - Unsigned Stage prefix: Initial diagnosis Histologic grading system: 3 grade system Laterality: Right Staged by: Pathologist and managing physician Stage used in treatment planning: Yes National guidelines used in treatment planning: Yes Type of national guideline used in treatment planning: NCCN - Pathologic stage from 10/04/2019: Stage IIB (pT2, pN1a(sn), cM0, G2, ER+, PR-, HER2-) - Signed by Theresa Socks, NP on 10/18/2019 Stage prefix: Initial diagnosis Method of lymph node assessment: Sentinel lymph node biopsy Histologic grading system: 3 grade system - Pathologic: Stage IV (pM1) - Signed by Theresa Socks, NP on 02/19/2023   SUMMARY OF ONCOLOGIC HISTORY: Oncology History  Malignant neoplasm of lower-inner quadrant of right breast of female, estrogen receptor positive (HCC)  07/03/2019 Initial Diagnosis   right lower inner quadrant biopsy, for a clinicaly multiple T1c N0, stage IA invasive ductal carcinoma, grade 2, E-cadherin positive, strongly estrogen receptor positive but progesterone receptor negative and HER-2 not amplified, with an MIB-1 of 5%             (a) breast MRI 08/07/2019 showed 2 additional suspicious areas in the right breast and 1 in the left breast              (b) biopsy of all 3 areas 08/21/2019 showed no malignancy   07/26/2019 -  Anti-estrogen oral therapy   tamoxifen started neoadjuvantly 07/26/2019; to be continued for 10 years.  S/p TAH/BSO on 01/16/2020 with benign pathology   08/04/2019 Genetic Testing   Negative genetic testing:  No pathogenic variants detected on the [facility]  Breast Cancer STAT panel or the Common Hereditary Cancers panel. The report date is 08/04/2019.  The STAT Breast cancer panel offered by [facility] includes sequencing and rearrangement analysis for the following 9 genes:  ATM, BRCA1, BRCA2, CDH1, CHEK2, PALB2, PTEN, STK11 and TP53.  The Common Hereditary Cancers Panel offered by [facility] includes sequencing and/or deletion duplication testing of the following 48 genes: APC, ATM, AXIN2, BARD1, BMPR1A, BRCA1, BRCA2, BRIP1, CDH1, CDK4, CDKN2A (p14ARF), CDKN2A (p16INK4a), CHEK2, CTNNA1, DICER1, EPCAM (Deletion/duplication testing only), GREM1 (promoter region deletion/duplication testing only), KIT, MEN1, MLH1, MSH2, MSH3, MSH6, MUTYH, NBN, NF1, NHTL1, PALB2, PDGFRA, PMS2, POLD1, POLE, PTEN, RAD50, RAD51C, RAD51D, RNF43, SDHB, SDHC, SDHD, SMAD4, SMARCA4. STK11, TP53, TSC1, TSC2, and VHL.  The following genes were evaluated for sequence changes only: SDHA and HOXB13 c.251G>A variant only.    10/04/2019 Cancer Staging   Staging form: Breast, AJCC 8th Edition - Pathologic stage from 10/04/2019: Stage IIB (pT2, pN1a(sn), cM0, G2, ER+, PR-, HER2-) - Signed by Theresa Socks, NP on 10/18/2019   10/04/2019 Surgery    right lumpectomy and sentinel lymph node sampling 10/04/2019 showed a pT2 pN1, stage IIB invasive ductal carcinoma, grade 2, with positive lymphovascular invasion and a positive superior margin             (a) 3 sentinel lymph nodes removed, one with macro metastatic deposit, 1 with a micrometastatic deposit             (b) additional surgery for margin clearance   10/04/2019 Miscellaneous    MammaPrint on the 10/04/2019 sample shows a low risk luminal  A tumor predicting a 5-year metastasis free survival of 96% without chemotherapy, and a chemotherapy benefit of less than 1.5%    11/27/2019 - 01/05/2020 Radiation Therapy    Site Technique Total Dose (Gy) Dose per Fx (Gy) Completed Fx Beam Energies  Breast, Right: Breast_Rt 3D 50/50 2 25/25 6X, 10X   Breast, Right: Breast_Rt_SCV_PAB 3D 50/50 2 25/25 6X, 10X  Breast, Right: Breast_Rt_Bst 3D 10/10 2 5/5 6X, 10X    09/29/2022 Imaging   IMPRESSION: 1. Surgical changes involving the right medial breast. No obvious recurrent breast mass or chest wall mass. 2. Mediastinal and hilar lymphadenopathy suggesting metastatic disease. 3. Patchy tree-in-bud type nodularity in the lungs suggesting chronic inflammation or atypical infection such as MAC. No definite pulmonary metastatic nodules. 4. Large partially necrotic mass involving most of the right hepatic lobe and a 2.5 cm lesion in segment 3 of the liver consistent with metastatic disease. 5. 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   10/06/2022 Initial Biopsy   Liver biopsy: + for malignancy, metastatic poorly differentiated carcinoma, likely breast origin, biomarkers pending, insufficient tissue for molecular testing. (Resulted in EPIC on 10/13/2022)   10/07/2022 PET scan   PET scan on 10/07/2022 that showed hypermetabolic metastatic breast cancer with hypermetabolic mediastinal and hilar adenopathy, hypermetabolic hepatic metastatic disease and bone disease.  There were no findings for pulmonary metastatic disease.   10/21/2022 -  Anti-estrogen oral therapy   Verzinio and Letrozole   01/15/2023 -  Chemotherapy   Patient is on Treatment Plan : BREAST METASTATIC Fam-Trastuzumab Deruxtecan-nxki (Enhertu) (5.4) q21d     02/19/2023 Cancer Staging   Staging form: Breast, AJCC 8th Edition - Pathologic: Stage IV (pM1) - Signed by Theresa Socks, NP on 02/19/2023   Cancer, metastatic to bone (HCC)  12/21/2022 Initial Diagnosis   Cancer, metastatic to bone (HCC)   01/15/2023 -  Chemotherapy   Patient is on Treatment Plan : BREAST METASTATIC Fam-Trastuzumab Deruxtecan-nxki (Enhertu) (5.4) q21d       CURRENT THERAPY: Enhertu  INTERVAL HISTORY: Theresa [name] 67 y.o. female returns for follow-[name]  and evaluation of tachycardia that she is experiencing when she is active.  At rest her heart rate is typically 100 but when she gets [name] to do exercise or walk around it shortly will increase to 130 at the max.  She continues to wear oxygen and tolerates it well.    She has not been taking the sodium tablet for the past week and her valsartan was discontinued at discharge from the hospital.  She feels like her abdomen is swollen and she is not sure what to do about it.  She also takes lorazepam 0.5 mg at night which causes her to be incredibly sleepy in the morning.   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  MEDICAL HISTORY: Past Medical History:  Diagnosis Date   Breast cancer, right (HCC)    Contact lens/glasses fitting    wears contacts or glasses   Diarrhea    --post gallbladder surgery   Family history of bone cancer    Family history of brain cancer    Family history of stomach cancer    Family  history of uterine cancer    Fibroids    History of radiation therapy    Hypertension    No pertinent past medical history    Personal history of radiation therapy    PONV (postoperative nausea and vomiting)    Right ovarian cyst    Vertigo     SURGICAL HISTORY: Past Surgical History:  Procedure Laterality Date   BLADDER SUSPENSION     BREAST BIOPSY  09/08/2012   Procedure: BREAST BIOPSY;  Surgeon: Adolph Pollack, Theresa [name];  Location: [facility];  Service: General;  Laterality: Left;  remove left breast mass   BREAST LUMPECTOMY Right 10/2019   BREAST LUMPECTOMY WITH RADIOACTIVE SEED AND SENTINEL LYMPH NODE BIOPSY Right 10/04/2019   Procedure: RIGHT BREAST LUMPECTOMY WITH BRACKETED RADIOACTIVE SEEDS, RIGHT BREAST RADIOACTIVE SEED GUIDED EXCISION BIOPSY, AND RIGHT SENTINEL LYMPH NODE BIOPSY;  Surgeon: Almond Lint, Theresa [name];  Location: [facility];  Service: General;  Laterality: Right;   BREAST SURGERY     lumpectomy x2   BUNIONECTOMY Right 01/2016   CHOLECYSTECTOMY     COLONOSCOPY     CYSTOSCOPY N/A 01/16/2020   Procedure: CYSTOSCOPY;  Surgeon: Patton Salles, Theresa [name];  Location: [facility];  Service: Gynecology;  Laterality: N/A;   IR BONE TUMOR(S)RF ABLATION  12/10/2022   IR IMAGING GUIDED PORT INSERTION  01/18/2023   IR KYPHO THORACIC WITH BONE BIOPSY  12/10/2022   IR RADIOLOGIST EVAL & MGMT  12/24/2022   MASTOPEXY Right 10/04/2019   Procedure: RIGHT BREAST MASTOPEXY;  Surgeon: Almond Lint, Theresa [name];  Location: [facility];  Service: General;  Laterality: Right;   RE-EXCISION OF BREAST LUMPECTOMY Right 10/31/2019   Procedure: RIGHT RE-EXCISION OF BREAST LUMPECTOMY;  Surgeon: Almond Lint, Theresa [name];  Location: [facility];  Service: General;  Laterality: Right;   TOTAL LAPAROSCOPIC HYSTERECTOMY WITH BILATERAL SALPINGO OOPHORECTOMY N/A 01/16/2020   Procedure: TOTAL LAPAROSCOPIC HYSTERECTOMY WITH BILATERAL SALPINGO  OOPHORECTOMY/COLLECTION OF PELVIC WASHINGS, LYSIS OF ADHESIONS;  Surgeon: Patton Salles, Theresa [name];  Location: [facility];  Service: Gynecology;  Laterality: N/A;  collection of pelvic washings   TUBAL LIGATION      SOCIAL HISTORY: Social History   Socioeconomic History   Marital status: Married    Spouse name: Daryl    Number of children: Not on file   Years of education: Not on file   Highest education level: Not on file  Occupational History   Not on file  Tobacco Use   Smoking status: Former    Types: Cigarettes    Quit date: 03/15/1996    Years since quitting: 26.9   Smokeless tobacco: Never  Vaping Use   Vaping Use: Never used  Substance and Sexual Activity   Alcohol use:  Not Currently    Comment: occ wine   Drug use: No   Sexual activity: Yes    Partners: Male    Birth control/protection: Surgical    Comment: BTL/hyst  Other Topics Concern   Not on file  Social History Narrative   Not on file   Social Determinants of Health   Financial Resource Strain: Not on file  Food Insecurity: No Food Insecurity (01/11/2023)   Hunger Vital Sign    Worried About Running Out of Food in the Last Year: Never true    Ran Out of Food in the Last Year: Never true  Transportation Needs: No Transportation Needs (01/11/2023)   PRAPARE - Administrator, Civil Service (Medical): No    Lack of Transportation (Non-Medical): No  Physical Activity: Not on file  Stress: Not on file  Social Connections: Not on file  Intimate Partner Violence: Not At Risk (01/11/2023)   Humiliation, Afraid, Rape, and Kick questionnaire    Fear of Current or Ex-Partner: No    Emotionally Abused: No    Physically Abused: No    Sexually Abused: No    FAMILY HISTORY: Family History  Problem Relation Age of Onset   Hypertension Father    Stomach cancer Father        may have been colon, diagnosed in his 75s   Depression Mother    Dementia Mother    Thyroid disease  Sister    Heart disease Sister    Brain cancer Maternal Aunt 22   Cancer Maternal Grandmother        undetermined type, diagnosed in her 59s   Bone cancer Paternal Grandfather 38   Uterine cancer Paternal Aunt 35   Uterine cancer Cousin        paternal 1st cousin, diagnosed in her late 25s   Uterine cancer Cousin 49       paternal 1st cousin    Review of Systems  Constitutional:  Negative for appetite change, chills, fatigue, fever and unexpected weight change.  HENT:   Negative for hearing loss, lump/mass, mouth sores, sore throat and trouble swallowing.   Eyes:  Negative for eye problems and icterus.  Respiratory:  Negative for chest tightness, cough and shortness of breath.   Cardiovascular:  Negative for chest pain, leg swelling and palpitations.  Gastrointestinal:  Negative for abdominal distention, abdominal pain, constipation, diarrhea, nausea and vomiting.  Endocrine: Negative for hot flashes.  Genitourinary:  Negative for difficulty urinating.   Musculoskeletal:  Negative for arthralgias.  Skin:  Negative for itching and rash.  Neurological:  Negative for dizziness, extremity weakness, headaches and numbness.  Hematological:  Negative for adenopathy. Does not bruise/bleed easily.  Psychiatric/Behavioral:  Negative for depression. The patient is not nervous/anxious.       PHYSICAL EXAMINATION   Onc Performance Status - 02/19/23 0800       ECOG Perf Status   ECOG Perf Status Ambulatory and capable of all selfcare but unable to carry out any work activities.  [name] and about more than 50% of waking hours      KPS SCALE   KPS % SCORE Normal activity with effort, some s/s of disease             Vitals:   02/19/23 0821  BP: 126/78  Pulse: (!) 121  Resp: 18  Temp: 97.9 F (36.6 C)  SpO2: 98%    Physical Exam Constitutional:      General: She is not in acute  distress.    Appearance: Normal appearance. She is not toxic-appearing.  HENT:     Head:  Normocephalic and atraumatic.     Mouth/Throat:     Mouth: Mucous membranes are moist.     Pharynx: Oropharynx is clear. No oropharyngeal exudate or posterior oropharyngeal erythema.  Eyes:     General: No scleral icterus. Cardiovascular:     Rate and Rhythm: Normal rate and regular rhythm.     Pulses: Normal pulses.     Heart sounds: Normal heart sounds.     Comments: HR 100 on recheck Pulmonary:     Effort: Pulmonary effort is normal.     Breath sounds: Normal breath sounds.  Abdominal:     General: Abdomen is flat. Bowel sounds are normal. There is no distension.     Palpations: Abdomen is soft.     Tenderness: There is no abdominal tenderness.  Musculoskeletal:        General: No swelling.     Cervical back: Neck supple.  Lymphadenopathy:     Cervical: No cervical adenopathy.  Skin:    General: Skin is warm and dry.     Findings: No rash.  Neurological:     General: No focal deficit present.     Mental Status: She is alert.  Psychiatric:        Mood and Affect: Mood normal.        Behavior: Behavior normal.     LABORATORY DATA:  CBC    Component Value Date/Time   WBC 6.1 02/19/2023 0752   WBC 4.0 01/21/2023 0625   RBC 2.56 (L) 02/19/2023 0752   HGB 9.1 (L) 02/19/2023 0752   HGB 10.8 (L) 01/19/2020 0911   HGB 12.8 03/15/2013 1434   HCT 27.8 (L) 02/19/2023 0752   HCT 32.8 (L) 01/19/2020 0911   PLT 52 (L) 02/19/2023 0752   PLT 216 01/19/2020 0911   MCV 108.6 (H) 02/19/2023 0752   MCV 94 01/19/2020 0911   MCH 35.5 (H) 02/19/2023 0752   MCHC 32.7 02/19/2023 0752   RDW 22.1 (H) 02/19/2023 0752   RDW 12.5 01/19/2020 0911   LYMPHSABS 1.2 02/19/2023 0752   LYMPHSABS 0.8 01/19/2020 0911   MONOABS 0.6 02/19/2023 0752   EOSABS 0.1 02/19/2023 0752   EOSABS 0.2 01/19/2020 0911   BASOSABS 0.0 02/19/2023 0752   BASOSABS 0.0 01/19/2020 0911    CMP     Component Value Date/Time   NA 135 02/19/2023 0752   K 4.2 02/19/2023 0752   CL 105 02/19/2023 0752   CO2 24  02/19/2023 0752   GLUCOSE 120 (H) 02/19/2023 0752   BUN 11 02/19/2023 0752   CREATININE 0.56 02/19/2023 0752   CALCIUM 8.1 (L) 02/19/2023 0752   PROT 5.2 (L) 02/19/2023 0752   ALBUMIN 3.0 (L) 02/19/2023 0752   AST 90 (H) 02/19/2023 0752   ALT 31 02/19/2023 0752   ALKPHOS 277 (H) 02/19/2023 0752   BILITOT 2.0 (H) 02/19/2023 0752   GFRNONAA >60 02/19/2023 0752   GFRAA >60 03/08/2020 1001      ASSESSMENT and THERAPY PLAN:   Malignant neoplasm of lower-inner quadrant of right breast of female, estrogen receptor positive (HCC) Theresa [name] is a 67 year old woman with metastatic breast cancer who started treatment on Enhertu.  Metastatic breast cancer: She developed widespread liver metastases.  She was started on Enhertu a few weeks ago.  Since that time her liver enzymes and T. bili have trended downward.  This is an early sign that  the treatment is working.  She will continue on Enhertu. Ascites: This will likely improve as her liver enzymes continue to improve.  I prescribed spironolactone 25 mg daily for her to take in hopes to help the fluid decrease as well.  She will return on Tuesday for lab only to ensure her sodium continues to be stable.  I recommended that she continue to abstain from taking the sodium tablet daily and will review her labs on Tuesday to see where that stands. Tachycardia: Her heart rate at rest is regular and within a normal range.  CT angiogram during her hospitalization is negative and this is not associated with any progressive hypoxia or shortness of breath.  This is likely related to deconditioning and I suggested that she lower her expectations and take it slower with the activities that she is working to do. Sleep pattern disturbance: I suggested that she cut the lorazepam pill in half.  I recommended that she and her husband get a pill cutter from the pharmacy to help with this. Constipation: I recommended she continue with stool softener and miralax daily and  titrate based on her stool consistency.    Our goals are to help manage [place] symptoms so she can return on Friday and receive her next dose of Enhertu.  I gave them information in her after visit summary and we will see her back on Tuesday for lab only and on Friday for labs, follow-[name] with Dr. Pamelia Hoit, her next treatment  All questions were answered. The patient knows to call the clinic with any problems, questions or concerns. We can certainly see the patient much sooner if necessary.  Total encounter time:45 minutes*in face-to-face visit time, chart review, lab review, care coordination, order entry, and documentation of the encounter time.    Lillard Anes, NP 02/19/23 9:26 AM Medical Oncology and Hematology [facility] [address] [place], [place] [address] Tel. [phone]    Fax. [phone]  *Total Encounter Time as defined by the Centers for Medicare and Medicaid Services includes, in addition to the face-to-face time of a patient visit (documented in the note above) non-face-to-face time: obtaining and reviewing outside history, ordering and reviewing medications, tests or procedures, care coordination (communications with other health care professionals or caregivers) and documentation in the medical record.

## 2023-02-20 ENCOUNTER — Other Ambulatory Visit: Payer: Self-pay

## 2023-02-20 ENCOUNTER — Other Ambulatory Visit: Payer: Self-pay | Admitting: Hematology and Oncology

## 2023-02-21 NOTE — Progress Notes (Signed)
Patient Care Team: Carylon Perches, MD as PCP - General (Internal Medicine) Almond Lint, MD as Consulting Physician (General Surgery) Lonie Peak, MD as Attending Physician (Radiation Oncology) Patton Salles, MD as Consulting Physician (Obstetrics and Gynecology) Venancio Poisson, MD as Consulting Physician (Dermatology) Crist Fat, MD as Consulting Physician (Urology) Charna Elizabeth, MD as Consulting Physician (Gastroenterology) Sheral Apley, MD as Attending Physician (Orthopedic Surgery) Serena Croissant, MD as Consulting Physician (Hematology and Oncology)  DIAGNOSIS:  Encounter Diagnoses  Name Primary?   Malignant neoplasm of lower-inner quadrant of right breast of female, estrogen receptor positive (HCC) Yes   Cancer, metastatic to bone (HCC)     SUMMARY OF ONCOLOGIC HISTORY: Oncology History  Malignant neoplasm of lower-inner quadrant of right breast of female, estrogen receptor positive (HCC)  07/03/2019 Initial Diagnosis   right lower inner quadrant biopsy, for a clinicaly multiple T1c N0, stage IA invasive ductal carcinoma, grade 2, E-cadherin positive, strongly estrogen receptor positive but progesterone receptor negative and HER-2 not amplified, with an MIB-1 of 5%             (a) breast MRI 08/07/2019 showed 2 additional suspicious areas in the right breast and 1 in the left breast              (b) biopsy of all 3 areas 08/21/2019 showed no malignancy   07/26/2019 -  Anti-estrogen oral therapy   tamoxifen started neoadjuvantly 07/26/2019; to be continued for 10 years.  S/p TAH/BSO on 01/16/2020 with benign pathology   08/04/2019 Genetic Testing   Negative genetic testing:  No pathogenic variants detected on the [facility] Breast Cancer STAT panel or the Common Hereditary Cancers panel. The report date is 08/04/2019.  The STAT Breast cancer panel offered by [facility] includes sequencing and rearrangement analysis for the following 9 genes:  ATM, BRCA1, BRCA2,  CDH1, CHEK2, PALB2, PTEN, STK11 and TP53.  The Common Hereditary Cancers Panel offered by [facility] includes sequencing and/or deletion duplication testing of the following 48 genes: APC, ATM, AXIN2, BARD1, BMPR1A, BRCA1, BRCA2, BRIP1, CDH1, CDK4, CDKN2A (p14ARF), CDKN2A (p16INK4a), CHEK2, CTNNA1, DICER1, EPCAM (Deletion/duplication testing only), GREM1 (promoter region deletion/duplication testing only), KIT, MEN1, MLH1, MSH2, MSH3, MSH6, MUTYH, NBN, NF1, NHTL1, PALB2, PDGFRA, PMS2, POLD1, POLE, PTEN, RAD50, RAD51C, RAD51D, RNF43, SDHB, SDHC, SDHD, SMAD4, SMARCA4. STK11, TP53, TSC1, TSC2, and VHL.  The following genes were evaluated for sequence changes only: SDHA and HOXB13 c.251G>A variant only.    10/04/2019 Cancer Staging   Staging form: Breast, AJCC 8th Edition - Pathologic stage from 10/04/2019: Stage IIB (pT2, pN1a(sn), cM0, G2, ER+, PR-, HER2-) - Signed by Loa Socks, NP on 10/18/2019   10/04/2019 Surgery    right lumpectomy and sentinel lymph node sampling 10/04/2019 showed a pT2 pN1, stage IIB invasive ductal carcinoma, grade 2, with positive lymphovascular invasion and a positive superior margin             (a) 3 sentinel lymph nodes removed, one with macro metastatic deposit, 1 with a micrometastatic deposit             (b) additional surgery for margin clearance   10/04/2019 Miscellaneous    MammaPrint on the 10/04/2019 sample shows a low risk luminal A tumor predicting a 5-year metastasis free survival of 96% without chemotherapy, and a chemotherapy benefit of less than 1.5%    11/27/2019 - 01/05/2020 Radiation Therapy    Site Technique Total Dose (Gy) Dose per Fx (Gy) Completed Fx Beam Energies  Breast, Right: Breast_Rt 3D 50/50 2 25/25 6X, 10X  Breast, Right: Breast_Rt_SCV_PAB 3D 50/50 2 25/25 6X, 10X  Breast, Right: Breast_Rt_Bst 3D 10/10 2 5/5 6X, 10X    09/29/2022 Imaging   IMPRESSION: 1. Surgical changes involving the right medial breast. No obvious recurrent breast mass  or chest wall mass. 2. Mediastinal and hilar lymphadenopathy suggesting metastatic disease. 3. Patchy tree-in-bud type nodularity in the lungs suggesting chronic inflammation or atypical infection such as MAC. No definite pulmonary metastatic nodules. 4. Large partially necrotic mass involving most of the right hepatic lobe and a 2.5 cm lesion in segment 3 of the liver consistent with metastatic disease. 5. 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   10/06/2022 Initial Biopsy   Liver biopsy: + for malignancy, metastatic poorly differentiated carcinoma, likely breast origin, biomarkers pending, insufficient tissue for molecular testing. (Resulted in EPIC on 10/13/2022)   10/07/2022 PET scan   PET scan on 10/07/2022 that showed hypermetabolic metastatic breast cancer with hypermetabolic mediastinal and hilar adenopathy, hypermetabolic hepatic metastatic disease and bone disease.  There were no findings for pulmonary metastatic disease.   10/21/2022 -  Anti-estrogen oral therapy   Verzinio and Letrozole   01/15/2023 -  Chemotherapy   Patient is on Treatment Plan : BREAST METASTATIC Fam-Trastuzumab Deruxtecan-nxki (Enhertu) (5.4) q21d     02/19/2023 Cancer Staging   Staging form: Breast, AJCC 8th Edition - Pathologic: Stage IV (pM1) - Signed by Loa Socks, NP on 02/19/2023   Cancer, metastatic to bone (HCC)  12/21/2022 Initial Diagnosis   Cancer, metastatic to bone (HCC)   01/15/2023 -  Chemotherapy   Patient is on Treatment Plan : BREAST METASTATIC Fam-Trastuzumab Deruxtecan-nxki (Enhertu) (5.4) q21d       CHIEF COMPLIANT: Cycle 3 Enhertu  INTERVAL HISTORY: Theresa [name] is a 67 y.o female with the above-mentioned Estrogen receptor positive HER2 low metastatic breast cancer. Currently on Enhertu and today cycle 3. She presents to the clinic for a follow-up. She reports that the leg swelling has got better and also her appetite. Hr got up  to 120 on yesterday. Still having swelling   ALLERGIES:  is allergic to metronidazole, other, and tape.  MEDICATIONS:  Current Outpatient Medications  Medication Sig Dispense Refill   dexamethasone (DECADRON) 4 MG tablet Take 1 tablet (4 mg total) by mouth daily. Take 1 tablet day chemotherapy 1 tablet 2 days after chemo with food 30 tablet 0   docusate sodium (COLACE) 100 MG capsule Take 1 capsule (100 mg total) by mouth daily. 30 capsule 2   furosemide (LASIX) 20 MG tablet TAKE 1 TABLET BY MOUTH DAILY WITH BREAKFAST 90 tablet 1   gabapentin (NEURONTIN) 300 MG capsule Take 1 capsule (300 mg total) by mouth at bedtime. 90 capsule 4   LORazepam (ATIVAN) 0.5 MG tablet Take 1 tablet (0.5 mg total) by mouth every 8 (eight) hours as needed. for anxiety 15 tablet 3   omeprazole (PRILOSEC) 20 MG capsule Take 1 capsule (20 mg total) by mouth 2 (two) times daily before a meal. 60 capsule 2   ondansetron (ZOFRAN) 8 MG tablet Take 1 tablet (8 mg total) by mouth every 8 (eight) hours as needed for nausea or vomiting. 30 tablet 2   oxyCODONE-acetaminophen (PERCOCET/ROXICET) 5-325 MG tablet Take 1 tablet by mouth every 6 (six) hours as needed for severe pain. 30 tablet 0   sodium chloride 1 g tablet Take 1 tablet (1 g total) by mouth 3 (  three) times daily with meals. (Patient taking differently: Take 1 g by mouth 3 (three) times daily with meals. Once daily) 30 tablet 1   spironolactone (ALDACTONE) 25 MG tablet Take 1 tablet (25 mg total) by mouth daily. 30 tablet 0   venlafaxine XR (EFFEXOR-XR) 37.5 MG 24 hr capsule Take 1 capsule (37.5 mg total) by mouth daily. 90 capsule 4   guaiFENesin (MUCINEX) 600 MG 12 hr tablet Take 600 mg by mouth 2 (two) times daily. (Patient not taking: Reported on 02/11/2023)     ipratropium (ATROVENT) 0.02 % nebulizer solution Take 2.5 mLs (0.5 mg total) by nebulization 2 (two) times daily. (Patient not taking: Reported on 02/11/2023) 75 mL 12   No current facility-administered  medications for this visit.    PHYSICAL EXAMINATION: ECOG PERFORMANCE STATUS: 1 - Symptomatic but completely ambulatory  Vitals:   02/26/23 1035  BP: 120/63  Pulse: (!) 105  Resp: 19  Temp: 98.5 F (36.9 C)  SpO2: 100%   Filed Weights   02/26/23 1035  Weight: 146 lb (66.2 kg)      LABORATORY DATA:  I have reviewed the data as listed    Latest Ref Rng & Units 02/26/2023   10:03 AM 02/23/2023    2:09 PM 02/19/2023    7:52 AM  CMP  Glucose 70 - 99 mg/dL 161  096  045   BUN 8 - 23 mg/dL 8  13  11    Creatinine 0.44 - 1.00 mg/dL 4.09  8.11  9.14   Sodium 135 - 145 mmol/L 134  134  135   Potassium 3.5 - 5.1 mmol/L 4.3  4.1  4.2   Chloride 98 - 111 mmol/L 105  105  105   CO2 22 - 32 mmol/L 22  24  24    Calcium 8.9 - 10.3 mg/dL 8.2  8.5  8.1   Total Protein 6.5 - 8.1 g/dL 5.6  5.6  5.2   Total Bilirubin 0.3 - 1.2 mg/dL 1.9  1.8  2.0   Alkaline Phos 38 - 126 U/L 289  281  277   AST 15 - 41 U/L 83  79  90   ALT 0 - 44 U/L 27  26  31      Lab Results  Component Value Date   WBC 3.7 (L) 02/26/2023   HGB 9.9 (L) 02/26/2023   HCT 30.0 (L) 02/26/2023   MCV 111.1 (H) 02/26/2023   PLT 76 (L) 02/26/2023   NEUTROABS 2.3 02/26/2023    ASSESSMENT & PLAN:  Malignant neoplasm of lower-inner quadrant of right breast of female, estrogen receptor positive (HCC) 07/03/2019: Right breast LIQ T1CN0 stage Ia grade 2 IDC ER 95% PR negative HER2 negative Ki-67 5% 10/04/2019: Right lumpectomy: T2N1 stage IIb grade 2 IDC positive lymphovascular invasion 3 lymph nodes removed 1 with macro N1 micrometastatic disease 10/04/2019: MammaPrint: Low risk 01/05/2020: Completed adjuvant radiation 08/04/2019: Genetics: Negative November 2020: Tamoxifen ------------------------------------------------------------------- 09/28/2022: Patient presented with platelets 65 INR 1.9 elevated LFTs and hypercalcemia  CT CAP 09/29/2022: Mediastinal and hilar lymphadenopathy suggestive of metastatic disease.  Patchy  tree-in-bud nodularity in the lungs, partially necrotic mass right hepatic lobe 12 cm, also 2.5 cm lesion lytic bone metastases in spine pelvis and left scapula   Treatment plan: Liver biopsy: Poorly diff cancer ER 30-40%, PR:0%, Her 2 Neg Verzinio with AI started 10/24/2022 discontinued May 2024 due to progression in the liver 3.  Bone Marrow Biopsy: 10/13/22: Positive for met breast cancer:   4.  Hospitalization 01/11/2023-01/21/2023: Severe jaundice due to progression in the liver 5.  Perative radiation to the spine  -------------------------------------------------------------------------------------------------------------------- Current treatment: Enhertu cycle 3 (started 01/15/2023) Enhertu toxicities: None   Lab review:  01/26/2023: Hemoglobin 9.3, platelets 35, AST 171, ALT 60, alkaline phosphatase 398, total bilirubin 2.8 (improved from 5) 02/05/2023: Hemoglobin 9.8, platelets 52, AST 134, ALT 38, bilirubin 2.5, alkaline phosphatase 358, albumin 3.1   Appetite and energy levels are slowly improving with time.   Home oxygen: Requires 2 L/min  Tachycardia with minimal activity and exertion. Ascites: Recommend paracentesis because it is getting uncomfortable. Insomnia issues: Takes lorazepam Constipation: Stool softener MiraLAX   Return to clinic in 3 weeks for cycle 4.  Will obtain scans prior to visit    Orders Placed This Encounter  Procedures   CT CHEST ABDOMEN PELVIS W CONTRAST    Standing Status:   Future    Standing Expiration Date:   02/26/2024    Order Specific Question:   If indicated for the ordered procedure, I authorize the administration of contrast media per Radiology protocol    Answer:   Yes    Order Specific Question:   Does the patient have a contrast media/X-ray dye allergy?    Answer:   No    Order Specific Question:   Preferred imaging location?    Answer:   [facility]    Order Specific Question:   If indicated for the ordered procedure, I  authorize the administration of oral contrast media per Radiology protocol    Answer:   Yes   The patient has a good understanding of the overall plan. she agrees with it. she will call with any problems that may develop before the next visit here. Total time spent: 30 mins including face to face time and time spent for planning, charting and co-ordination of care   Tamsen Meek, MD 02/26/23

## 2023-02-22 ENCOUNTER — Encounter: Payer: Self-pay | Admitting: Hematology and Oncology

## 2023-02-23 ENCOUNTER — Other Ambulatory Visit: Payer: Self-pay

## 2023-02-23 ENCOUNTER — Inpatient Hospital Stay: Payer: Medicare HMO

## 2023-02-23 ENCOUNTER — Encounter: Payer: Self-pay | Admitting: Hematology and Oncology

## 2023-02-23 VITALS — BP 123/72 | HR 109 | Temp 98.2°F | Resp 17

## 2023-02-23 DIAGNOSIS — C7951 Secondary malignant neoplasm of bone: Secondary | ICD-10-CM

## 2023-02-23 DIAGNOSIS — C419 Malignant neoplasm of bone and articular cartilage, unspecified: Secondary | ICD-10-CM

## 2023-02-23 DIAGNOSIS — Z5112 Encounter for antineoplastic immunotherapy: Secondary | ICD-10-CM | POA: Diagnosis not present

## 2023-02-23 DIAGNOSIS — C50311 Malignant neoplasm of lower-inner quadrant of right female breast: Secondary | ICD-10-CM

## 2023-02-23 LAB — CBC WITH DIFFERENTIAL (CANCER CENTER ONLY)
Abs Immature Granulocytes: 0.04 10*3/uL (ref 0.00–0.07)
Basophils Absolute: 0 10*3/uL (ref 0.0–0.1)
Basophils Relative: 1 %
Eosinophils Absolute: 0.1 10*3/uL (ref 0.0–0.5)
Eosinophils Relative: 2 %
HCT: 28.4 % — ABNORMAL LOW (ref 36.0–46.0)
Hemoglobin: 9.6 g/dL — ABNORMAL LOW (ref 12.0–15.0)
Immature Granulocytes: 1 %
Lymphocytes Relative: 25 %
Lymphs Abs: 1.1 10*3/uL (ref 0.7–4.0)
MCH: 37.4 pg — ABNORMAL HIGH (ref 26.0–34.0)
MCHC: 33.8 g/dL (ref 30.0–36.0)
MCV: 110.5 fL — ABNORMAL HIGH (ref 80.0–100.0)
Monocytes Absolute: 0.7 10*3/uL (ref 0.1–1.0)
Monocytes Relative: 15 %
Neutro Abs: 2.4 10*3/uL (ref 1.7–7.7)
Neutrophils Relative %: 56 %
Platelet Count: 66 10*3/uL — ABNORMAL LOW (ref 150–400)
RBC: 2.57 MIL/uL — ABNORMAL LOW (ref 3.87–5.11)
RDW: 22.8 % — ABNORMAL HIGH (ref 11.5–15.5)
WBC Count: 4.4 10*3/uL (ref 4.0–10.5)
nRBC: 1.4 % — ABNORMAL HIGH (ref 0.0–0.2)

## 2023-02-23 LAB — CMP (CANCER CENTER ONLY)
ALT: 26 U/L (ref 0–44)
AST: 79 U/L — ABNORMAL HIGH (ref 15–41)
Albumin: 3 g/dL — ABNORMAL LOW (ref 3.5–5.0)
Alkaline Phosphatase: 281 U/L — ABNORMAL HIGH (ref 38–126)
Anion gap: 5 (ref 5–15)
BUN: 13 mg/dL (ref 8–23)
CO2: 24 mmol/L (ref 22–32)
Calcium: 8.5 mg/dL — ABNORMAL LOW (ref 8.9–10.3)
Chloride: 105 mmol/L (ref 98–111)
Creatinine: 0.59 mg/dL (ref 0.44–1.00)
GFR, Estimated: >60 mL/min (ref >60)
Glucose, Bld: 114 mg/dL — ABNORMAL HIGH (ref 70–99)
Potassium: 4.1 mmol/L (ref 3.5–5.1)
Sodium: 134 mmol/L — ABNORMAL LOW (ref 135–145)
Total Bilirubin: 1.8 mg/dL — ABNORMAL HIGH (ref 0.3–1.2)
Total Protein: 5.6 g/dL — ABNORMAL LOW (ref 6.5–8.1)

## 2023-02-23 MED ORDER — SODIUM CHLORIDE 0.9% FLUSH
10.0000 mL | Freq: Once | INTRAVENOUS | Status: AC
  Administered 2023-02-23: 10 mL

## 2023-02-23 MED ORDER — HEPARIN SOD (PORK) LOCK FLUSH 100 UNIT/ML IV SOLN
500.0000 [IU] | Freq: Once | INTRAVENOUS | Status: AC
  Administered 2023-02-23: 500 [IU]

## 2023-02-25 ENCOUNTER — Ambulatory Visit: Payer: Medicare HMO | Admitting: Radiation Oncology

## 2023-02-25 MED FILL — Fosaprepitant Dimeglumine For IV Infusion 150 MG (Base Eq): INTRAVENOUS | Qty: 5 | Status: AC

## 2023-02-25 MED FILL — Dexamethasone Sodium Phosphate Inj 100 MG/10ML: INTRAMUSCULAR | Qty: 1 | Status: AC

## 2023-02-26 ENCOUNTER — Inpatient Hospital Stay: Payer: Medicare HMO

## 2023-02-26 ENCOUNTER — Other Ambulatory Visit: Payer: Self-pay

## 2023-02-26 ENCOUNTER — Inpatient Hospital Stay: Payer: Medicare HMO | Admitting: Hematology and Oncology

## 2023-02-26 ENCOUNTER — Ambulatory Visit: Payer: Self-pay | Admitting: Radiation Oncology

## 2023-02-26 ENCOUNTER — Inpatient Hospital Stay: Payer: Medicare HMO | Admitting: Dietician

## 2023-02-26 ENCOUNTER — Telehealth: Payer: Self-pay

## 2023-02-26 VITALS — BP 120/63 | HR 105 | Temp 98.5°F | Resp 19 | Ht 66.0 in | Wt 146.0 lb

## 2023-02-26 VITALS — BP 115/63 | HR 99 | Resp 20

## 2023-02-26 DIAGNOSIS — C7951 Secondary malignant neoplasm of bone: Secondary | ICD-10-CM | POA: Diagnosis not present

## 2023-02-26 DIAGNOSIS — Z170 Estrogen receptor positive status [ER+]: Secondary | ICD-10-CM

## 2023-02-26 DIAGNOSIS — R180 Malignant ascites: Secondary | ICD-10-CM

## 2023-02-26 DIAGNOSIS — Z5112 Encounter for antineoplastic immunotherapy: Secondary | ICD-10-CM | POA: Diagnosis not present

## 2023-02-26 DIAGNOSIS — C50311 Malignant neoplasm of lower-inner quadrant of right female breast: Secondary | ICD-10-CM | POA: Diagnosis not present

## 2023-02-26 LAB — CMP (CANCER CENTER ONLY)
ALT: 27 U/L (ref 0–44)
AST: 83 U/L — ABNORMAL HIGH (ref 15–41)
Albumin: 3 g/dL — ABNORMAL LOW (ref 3.5–5.0)
Alkaline Phosphatase: 289 U/L — ABNORMAL HIGH (ref 38–126)
Anion gap: 7 (ref 5–15)
BUN: 8 mg/dL (ref 8–23)
CO2: 22 mmol/L (ref 22–32)
Calcium: 8.2 mg/dL — ABNORMAL LOW (ref 8.9–10.3)
Chloride: 105 mmol/L (ref 98–111)
Creatinine: 0.6 mg/dL (ref 0.44–1.00)
GFR, Estimated: >60 mL/min (ref >60)
Glucose, Bld: 133 mg/dL — ABNORMAL HIGH (ref 70–99)
Potassium: 4.3 mmol/L (ref 3.5–5.1)
Sodium: 134 mmol/L — ABNORMAL LOW (ref 135–145)
Total Bilirubin: 1.9 mg/dL — ABNORMAL HIGH (ref 0.3–1.2)
Total Protein: 5.6 g/dL — ABNORMAL LOW (ref 6.5–8.1)

## 2023-02-26 LAB — CBC WITH DIFFERENTIAL (CANCER CENTER ONLY)
Abs Immature Granulocytes: 0.06 10*3/uL (ref 0.00–0.07)
Basophils Absolute: 0 10*3/uL (ref 0.0–0.1)
Basophils Relative: 1 %
Eosinophils Absolute: 0 10*3/uL (ref 0.0–0.5)
Eosinophils Relative: 1 %
HCT: 30 % — ABNORMAL LOW (ref 36.0–46.0)
Hemoglobin: 9.9 g/dL — ABNORMAL LOW (ref 12.0–15.0)
Immature Granulocytes: 2 %
Lymphocytes Relative: 19 %
Lymphs Abs: 0.7 10*3/uL (ref 0.7–4.0)
MCH: 36.7 pg — ABNORMAL HIGH (ref 26.0–34.0)
MCHC: 33 g/dL (ref 30.0–36.0)
MCV: 111.1 fL — ABNORMAL HIGH (ref 80.0–100.0)
Monocytes Absolute: 0.5 10*3/uL (ref 0.1–1.0)
Monocytes Relative: 15 %
Neutro Abs: 2.3 10*3/uL (ref 1.7–7.7)
Neutrophils Relative %: 62 %
Platelet Count: 76 10*3/uL — ABNORMAL LOW (ref 150–400)
RBC: 2.7 MIL/uL — ABNORMAL LOW (ref 3.87–5.11)
RDW: 22.4 % — ABNORMAL HIGH (ref 11.5–15.5)
WBC Count: 3.7 10*3/uL — ABNORMAL LOW (ref 4.0–10.5)
nRBC: 0.8 % — ABNORMAL HIGH (ref 0.0–0.2)

## 2023-02-26 MED ORDER — DIPHENHYDRAMINE HCL 25 MG PO CAPS
50.0000 mg | ORAL_CAPSULE | Freq: Once | ORAL | Status: AC
  Administered 2023-02-26: 50 mg via ORAL
  Filled 2023-02-26: qty 2

## 2023-02-26 MED ORDER — FAM-TRASTUZUMAB DERUXTECAN-NXKI CHEMO 100 MG IV SOLR
4.4000 mg/kg | Freq: Once | INTRAVENOUS | Status: AC
  Administered 2023-02-26: 300 mg via INTRAVENOUS
  Filled 2023-02-26: qty 15

## 2023-02-26 MED ORDER — DEXTROSE 5 % IV SOLN: Freq: Once | INTRAVENOUS | Status: AC

## 2023-02-26 MED ORDER — SODIUM CHLORIDE 0.9% FLUSH
10.0000 mL | INTRAVENOUS | Status: DC | PRN
  Administered 2023-02-26: 10 mL

## 2023-02-26 MED ORDER — SODIUM CHLORIDE 0.9 % IV SOLN
10.0000 mg | Freq: Once | INTRAVENOUS | Status: AC
  Administered 2023-02-26: 10 mg via INTRAVENOUS
  Filled 2023-02-26: qty 10

## 2023-02-26 MED ORDER — PALONOSETRON HCL INJECTION 0.25 MG/5ML
0.2500 mg | Freq: Once | INTRAVENOUS | Status: AC
  Administered 2023-02-26: 0.25 mg via INTRAVENOUS
  Filled 2023-02-26: qty 5

## 2023-02-26 MED ORDER — SODIUM CHLORIDE 0.9 % IV SOLN
150.0000 mg | Freq: Once | INTRAVENOUS | Status: AC
  Administered 2023-02-26: 150 mg via INTRAVENOUS
  Filled 2023-02-26: qty 150

## 2023-02-26 MED ORDER — ACETAMINOPHEN 325 MG PO TABS
650.0000 mg | ORAL_TABLET | Freq: Once | ORAL | Status: AC
  Administered 2023-02-26: 650 mg via ORAL
  Filled 2023-02-26: qty 2

## 2023-02-26 MED ORDER — HEPARIN SOD (PORK) LOCK FLUSH 100 UNIT/ML IV SOLN
500.0000 [IU] | Freq: Once | INTRAVENOUS | Status: AC | PRN
  Administered 2023-02-26: 500 [IU]

## 2023-02-26 NOTE — Assessment & Plan Note (Addendum)
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  Patchy tree-in-bud nodularity in the lungs, partially necrotic mass right hepatic lobe 12 cm, also 2.5 cm lesion lytic bone metastases in spine pelvis and left scapula   Treatment plan: Liver biopsy: Poorly diff cancer ER 30-40%, PR:0%, Her 2 Neg Verzinio with AI started 10/24/2022 discontinued May 2024 due to progression in the liver 3.  Bone Marrow Biopsy: 10/13/22: Positive for met breast cancer:   4.  Hospitalization 01/11/2023-01/21/2023: Severe jaundice due to progression in the liver 5.  Perative radiation to the spine  -------------------------------------------------------------------------------------------------------------------- Current treatment: Enhertu cycle 3 (started 01/15/2023) Enhertu toxicities: None   Lab review:  01/26/2023: Hemoglobin 9.3, platelets 35, AST 171, ALT 60, alkaline phosphatase 398, total bilirubin 2.8 (improved from 5) 02/05/2023: Hemoglobin 9.8, platelets 52, AST 134, ALT 38, bilirubin 2.5, alkaline phosphatase 358, albumin 3.1   Appetite and energy levels are slowly improving with time.   Home oxygen: Requires 2 L/min  Tachycardia with minimal activity and exertion. Ascites: Recommend paracentesis because it is getting uncomfortable. Insomnia issues: Takes lorazepam Constipation: Stool softener MiraLAX   Return to clinic in 3 weeks for cycle 4.   Will obtain scans prior to visit

## 2023-02-26 NOTE — Patient Instructions (Signed)
[facility]  Discharge Instructions: Thank you for choosing [facility] to provide your oncology and hematology care.   If you have a lab appointment with the Cancer Center, please go directly to the Cancer Center and check in at the registration area.   Wear comfortable clothing and clothing appropriate for easy access to any Portacath or PICC line.   We strive to give you quality time with your provider. You may need to reschedule your appointment if you arrive late (15 or more minutes).  Arriving late affects you and other patients whose appointments are after yours.  Also, if you miss three or more appointments without notifying the office, you may be dismissed from the clinic at the provider's discretion.      For prescription refill requests, have your pharmacy contact our office and allow 72 hours for refills to be completed.    Today you received the following chemotherapy and/or immunotherapy agents Enhertu.   To help prevent nausea and vomiting after your treatment, we encourage you to take your nausea medication as directed.  BELOW ARE SYMPTOMS THAT SHOULD BE REPORTED IMMEDIATELY: *FEVER GREATER THAN 100.4 F (38 C) OR HIGHER *CHILLS OR SWEATING *NAUSEA AND VOMITING THAT IS NOT CONTROLLED WITH YOUR NAUSEA MEDICATION *UNUSUAL SHORTNESS OF BREATH *UNUSUAL BRUISING OR BLEEDING *URINARY PROBLEMS (pain or burning when urinating, or frequent urination) *BOWEL PROBLEMS (unusual diarrhea, constipation, pain near the anus) TENDERNESS IN MOUTH AND THROAT WITH OR WITHOUT PRESENCE OF ULCERS (sore throat, sores in mouth, or a toothache) UNUSUAL RASH, SWELLING OR PAIN  UNUSUAL VAGINAL DISCHARGE OR ITCHING   Items with * indicate a potential emergency and should be followed up as soon as possible or go to the Emergency Department if any problems should occur.  Please show the CHEMOTHERAPY ALERT CARD or IMMUNOTHERAPY ALERT CARD at check-in  to the Emergency Department and triage nurse.  Should you have questions after your visit or need to cancel or reschedule your appointment, please contact [facility]  Dept: [phone]  and follow the prompts.  Office hours are 8:00 a.m. to 4:30 p.m. Monday - Friday. Please note that voicemails left after 4:00 p.m. may not be returned until the following business day.  We are closed weekends and major holidays. You have access to a nurse at all times for urgent questions. Please call the main number to the clinic Dept: [phone] and follow the prompts.   For any non-urgent questions, you may also contact your provider using MyChart. We now offer e-Visits for anyone 18 and older to request care online for non-urgent symptoms. For details visit [email or web address].   Also download the MyChart app! Go to the app store, search "MyChart", open the app, select [facility], and log in with your MyChart username and password.   

## 2023-02-26 NOTE — Progress Notes (Signed)
Nutrition Follow-up:  Patient with recurrent breast cancer metastatic to bone. She is currently receiving enhertu q21d (start 5/17). Patient is under the care of Dr. Pamelia Hoit.   Met with patient and husband in infusion. Patient sleeping soundly. She received premedications prior to visit. Husband reports appetite has been much better over the last couple of weeks. Foods are tasting better, but not eating much at one time. She has early satiety due to abdominal ascites.   Medications: reviewed   Labs: Na 134, glucose 133, albumin 3.0  Anthropometrics: Wt 146 lb today increased (wt gain likely d/t fluid - planned paracentesis 7/2)   NUTRITION DIAGNOSIS: Unintended weight loss stable   INTERVENTION:  Encouraged small frequent meals/snacks with high calorie high protein foods     MONITORING, EVALUATION, GOAL: weight trends, intake   NEXT VISIT: Friday August 9 during infusion

## 2023-02-26 NOTE — Telephone Encounter (Signed)
Pt scheduled for US paracentesis per MD 03/02/23 at 2 PM with a 1:45 arrival. S/w Reine Just who will make pt aware of this.

## 2023-03-02 ENCOUNTER — Ambulatory Visit (HOSPITAL_COMMUNITY)
Admission: RE | Admit: 2023-03-02 | Discharge: 2023-03-02 | Disposition: A | Discharge status: Home / Self Care | Payer: Medicare HMO | Source: Ambulatory Visit | Attending: Hematology and Oncology | Admitting: Hematology and Oncology

## 2023-03-02 DIAGNOSIS — R180 Malignant ascites: Secondary | ICD-10-CM | POA: Diagnosis present

## 2023-03-02 DIAGNOSIS — Z170 Estrogen receptor positive status [ER+]: Secondary | ICD-10-CM | POA: Insufficient documentation

## 2023-03-02 DIAGNOSIS — C50311 Malignant neoplasm of lower-inner quadrant of right female breast: Secondary | ICD-10-CM | POA: Insufficient documentation

## 2023-03-02 MED ORDER — LIDOCAINE HCL 1 % IJ SOLN
INTRAMUSCULAR | Status: AC
  Filled 2023-03-02: qty 20

## 2023-03-02 NOTE — Procedures (Signed)
Ultrasound-guided diagnostic and therapeutic paracentesis performed yielding 1 liter of yellow  fluid. No immediate complications.  A portion of the fluid was submitted to the lab for preordered studies. EBL < 2 cc.

## 2023-03-05 LAB — CYTOLOGY - NON PAP

## 2023-03-08 ENCOUNTER — Encounter: Payer: Self-pay | Admitting: Hematology and Oncology

## 2023-03-09 ENCOUNTER — Telehealth: Payer: Self-pay

## 2023-03-09 NOTE — Telephone Encounter (Signed)
Called pt per MyChart message to have her scheduled for CT CAP. She will come in to [facility] 03/10/23 at 1015 for [facility] access and go to radiology for CT CAP. She will f/u with MD 7/19 as scheduled. Pt voiced concern that even after her paracentesis she "still looks pregnant" but she denies [facility] or abdominal discomfort. She knows to call should she develop any sx.

## 2023-03-10 ENCOUNTER — Ambulatory Visit (HOSPITAL_COMMUNITY)
Admission: RE | Admit: 2023-03-10 | Discharge: 2023-03-10 | Disposition: A | Discharge status: Home / Self Care | Payer: Medicare HMO | Source: Ambulatory Visit | Attending: Hematology and Oncology | Admitting: Hematology and Oncology

## 2023-03-10 ENCOUNTER — Inpatient Hospital Stay: Payer: Medicare HMO | Attending: Hematology and Oncology

## 2023-03-10 ENCOUNTER — Other Ambulatory Visit: Payer: Self-pay

## 2023-03-10 DIAGNOSIS — C787 Secondary malignant neoplasm of liver and intrahepatic bile duct: Secondary | ICD-10-CM | POA: Diagnosis present

## 2023-03-10 DIAGNOSIS — C7951 Secondary malignant neoplasm of bone: Secondary | ICD-10-CM | POA: Diagnosis not present

## 2023-03-10 DIAGNOSIS — C50311 Malignant neoplasm of lower-inner quadrant of right female breast: Secondary | ICD-10-CM | POA: Diagnosis present

## 2023-03-10 DIAGNOSIS — Z5111 Encounter for antineoplastic chemotherapy: Secondary | ICD-10-CM | POA: Insufficient documentation

## 2023-03-10 DIAGNOSIS — Z79899 Other long term (current) drug therapy: Secondary | ICD-10-CM | POA: Diagnosis not present

## 2023-03-10 DIAGNOSIS — Z170 Estrogen receptor positive status [ER+]: Secondary | ICD-10-CM | POA: Insufficient documentation

## 2023-03-10 DIAGNOSIS — Z7952 Long term (current) use of systemic steroids: Secondary | ICD-10-CM | POA: Insufficient documentation

## 2023-03-10 DIAGNOSIS — Z923 Personal history of irradiation: Secondary | ICD-10-CM | POA: Insufficient documentation

## 2023-03-10 DIAGNOSIS — C419 Malignant neoplasm of bone and articular cartilage, unspecified: Secondary | ICD-10-CM

## 2023-03-10 MED ORDER — IOHEXOL 300 MG/ML  SOLN
100.0000 mL | Freq: Once | INTRAMUSCULAR | Status: AC | PRN
  Administered 2023-03-10: 100 mL via INTRAVENOUS

## 2023-03-10 MED ORDER — HEPARIN SOD (PORK) LOCK FLUSH 100 UNIT/ML IV SOLN
500.0000 [IU] | Freq: Once | INTRAVENOUS | Status: AC
  Administered 2023-03-10: 500 [IU] via INTRAVENOUS

## 2023-03-10 MED ORDER — HEPARIN SOD (PORK) LOCK FLUSH 100 UNIT/ML IV SOLN
INTRAVENOUS | Status: AC
  Filled 2023-03-10: qty 5

## 2023-03-10 MED ORDER — SODIUM CHLORIDE 0.9% FLUSH
10.0000 mL | Freq: Once | INTRAVENOUS | Status: AC
  Administered 2023-03-10: 10 mL

## 2023-03-13 ENCOUNTER — Other Ambulatory Visit: Payer: Self-pay | Admitting: Adult Health

## 2023-03-13 DIAGNOSIS — R180 Malignant ascites: Secondary | ICD-10-CM

## 2023-03-13 DIAGNOSIS — C50311 Malignant neoplasm of lower-inner quadrant of right female breast: Secondary | ICD-10-CM

## 2023-03-15 ENCOUNTER — Encounter: Payer: Self-pay | Admitting: Hematology and Oncology

## 2023-03-16 NOTE — Progress Notes (Signed)
Patient Care Team: Carylon Perches, MD as PCP - General (Internal Medicine) Almond Lint, MD as Consulting Physician (General Surgery) Lonie Peak, MD as Attending Physician (Radiation Oncology) Patton Salles, MD as Consulting Physician (Obstetrics and Gynecology) Venancio Poisson, MD as Consulting Physician (Dermatology) Crist Fat, MD as Consulting Physician (Urology) Charna Elizabeth, MD as Consulting Physician (Gastroenterology) Sheral Apley, MD as Attending Physician (Orthopedic Surgery) Serena Croissant, MD as Consulting Physician (Hematology and Oncology)  DIAGNOSIS:  Encounter Diagnoses  Name Primary?   Malignant neoplasm of lower-inner quadrant of right breast of female, estrogen receptor positive (HCC) Yes   Cancer, metastatic to bone (HCC)     SUMMARY OF ONCOLOGIC HISTORY: Oncology History  Malignant neoplasm of lower-inner quadrant of right breast of female, estrogen receptor positive (HCC)  07/03/2019 Initial Diagnosis   right lower inner quadrant biopsy, for a clinicaly multiple T1c N0, stage IA invasive ductal carcinoma, grade 2, E-cadherin positive, strongly estrogen receptor positive but progesterone receptor negative and HER-2 not amplified, with an MIB-1 of 5%             (a) breast MRI 08/07/2019 showed 2 additional suspicious areas in the right breast and 1 in the left breast              (b) biopsy of all 3 areas 08/21/2019 showed no malignancy   07/26/2019 -  Anti-estrogen oral therapy   tamoxifen started neoadjuvantly 07/26/2019; to be continued for 10 years.  S/p TAH/BSO on 01/16/2020 with benign pathology   08/04/2019 Genetic Testing   Negative genetic testing:  No pathogenic variants detected on the [facility] Breast Cancer STAT panel or the Common Hereditary Cancers panel. The report date is 08/04/2019.  The STAT Breast cancer panel offered by [facility] includes sequencing and rearrangement analysis for the following 9 genes:  ATM, BRCA1, BRCA2,  CDH1, CHEK2, PALB2, PTEN, STK11 and TP53.  The Common Hereditary Cancers Panel offered by [facility] includes sequencing and/or deletion duplication testing of the following 48 genes: APC, ATM, AXIN2, BARD1, BMPR1A, BRCA1, BRCA2, BRIP1, CDH1, CDK4, CDKN2A (p14ARF), CDKN2A (p16INK4a), CHEK2, CTNNA1, DICER1, EPCAM (Deletion/duplication testing only), GREM1 (promoter region deletion/duplication testing only), KIT, MEN1, MLH1, MSH2, MSH3, MSH6, MUTYH, NBN, NF1, NHTL1, PALB2, PDGFRA, PMS2, POLD1, POLE, PTEN, RAD50, RAD51C, RAD51D, RNF43, SDHB, SDHC, SDHD, SMAD4, SMARCA4. STK11, TP53, TSC1, TSC2, and VHL.  The following genes were evaluated for sequence changes only: SDHA and HOXB13 c.251G>A variant only.    10/04/2019 Cancer Staging   Staging form: Breast, AJCC 8th Edition - Pathologic stage from 10/04/2019: Stage IIB (pT2, pN1a(sn), cM0, G2, ER+, PR-, HER2-) - Signed by Loa Socks, NP on 10/18/2019   10/04/2019 Surgery    right lumpectomy and sentinel lymph node sampling 10/04/2019 showed a pT2 pN1, stage IIB invasive ductal carcinoma, grade 2, with positive lymphovascular invasion and a positive superior margin             (a) 3 sentinel lymph nodes removed, one with macro metastatic deposit, 1 with a micrometastatic deposit             (b) additional surgery for margin clearance   10/04/2019 Miscellaneous    MammaPrint on the 10/04/2019 sample shows a low risk luminal A tumor predicting a 5-year metastasis free survival of 96% without chemotherapy, and a chemotherapy benefit of less than 1.5%    11/27/2019 - 01/05/2020 Radiation Therapy    Site Technique Total Dose (Gy) Dose per Fx (Gy) Completed Fx Beam Energies  Breast, Right: Breast_Rt 3D 50/50 2 25/25 6X, 10X  Breast, Right: Breast_Rt_SCV_PAB 3D 50/50 2 25/25 6X, 10X  Breast, Right: Breast_Rt_Bst 3D 10/10 2 5/5 6X, 10X     09/29/2022 Imaging   IMPRESSION: 1. Surgical changes involving the right medial breast. No obvious recurrent breast mass  or chest wall mass. 2. Mediastinal and hilar lymphadenopathy suggesting metastatic disease. 3. Patchy tree-in-bud type nodularity in the lungs suggesting chronic inflammation or atypical infection such as MAC. No definite pulmonary metastatic nodules. 4. Large partially necrotic mass involving most of the right hepatic lobe and a 2.5 cm lesion in segment 3 of the liver consistent with metastatic disease. 5. 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   10/06/2022 Initial Biopsy   Liver biopsy: + for malignancy, metastatic poorly differentiated carcinoma, likely breast origin, biomarkers pending, insufficient tissue for molecular testing. (Resulted in EPIC on 10/13/2022)   10/07/2022 PET scan   PET scan on 10/07/2022 that showed hypermetabolic metastatic breast cancer with hypermetabolic mediastinal and hilar adenopathy, hypermetabolic hepatic metastatic disease and bone disease.  There were no findings for pulmonary metastatic disease.   10/21/2022 -  Anti-estrogen oral therapy   Verzinio and Letrozole   01/15/2023 -  Chemotherapy   Patient is on Treatment Plan : BREAST METASTATIC Fam-Trastuzumab Deruxtecan-nxki (Enhertu) (5.4) q21d     02/19/2023 Cancer Staging   Staging form: Breast, AJCC 8th Edition - Pathologic: Stage IV (pM1) - Signed by Loa Socks, NP on 02/19/2023   Cancer, metastatic to bone (HCC)  12/21/2022 Initial Diagnosis   Cancer, metastatic to bone (HCC)   01/15/2023 -  Chemotherapy   Patient is on Treatment Plan : BREAST METASTATIC Fam-Trastuzumab Deruxtecan-nxki (Enhertu) (5.4) q21d       CHIEF COMPLIANT: Cycle 4 Enhertu   INTERVAL HISTORY: Theresa [name] is a 67 y.o female with the above-mentioned Estrogen receptor positive HER2 low metastatic breast cancer. Currently on Enhertu and today cycle 4. She presents to the clinic for a follow-up. Pt reports that she is doing better. She came into clinic today without wheelchair  and oxygen. She says she has been walking and doing some daily activities. She was able to ride her horse for therapy and some walking everyday. Juandice has subsided. She tolerated treatment well. No nausea some fatigue. She says belly feels better when she walks. She does get some foot cramps.   ALLERGIES:  is allergic to metronidazole, other, and tape.  MEDICATIONS:  Current Outpatient Medications  Medication Sig Dispense Refill   dexamethasone (DECADRON) 4 MG tablet Take 1 tablet (4 mg total) by mouth daily. Take 1 tablet day chemotherapy 1 tablet 2 days after chemo with food 30 tablet 0   docusate sodium (COLACE) 100 MG capsule Take 1 capsule (100 mg total) by mouth daily. 30 capsule 2   furosemide (LASIX) 20 MG tablet TAKE 1 TABLET BY MOUTH DAILY WITH BREAKFAST 90 tablet 1   gabapentin (NEURONTIN) 300 MG capsule Take 1 capsule (300 mg total) by mouth at bedtime. 90 capsule 4   LORazepam (ATIVAN) 0.5 MG tablet Take 1 tablet (0.5 mg total) by mouth every 8 (eight) hours as needed. for anxiety 15 tablet 3   omeprazole (PRILOSEC) 20 MG capsule Take 1 capsule (20 mg total) by mouth 2 (two) times daily before a meal. 60 capsule 2   ondansetron (ZOFRAN) 8 MG tablet Take 1 tablet (8 mg total) by mouth every 8 (eight) hours as needed for nausea or  vomiting. 30 tablet 2   oxyCODONE-acetaminophen (PERCOCET/ROXICET) 5-325 MG tablet Take 1 tablet by mouth every 6 (six) hours as needed for severe pain. 30 tablet 0   spironolactone (ALDACTONE) 25 MG tablet TAKE 1 TABLET (25 MG TOTAL) BY MOUTH DAILY. 90 tablet 1   venlafaxine XR (EFFEXOR-XR) 37.5 MG 24 hr capsule Take 1 capsule (37.5 mg total) by mouth daily. 90 capsule 4   guaiFENesin (MUCINEX) 600 MG 12 hr tablet Take 600 mg by mouth 2 (two) times daily. (Patient not taking: Reported on 02/11/2023)     ipratropium (ATROVENT) 0.02 % nebulizer solution Take 2.5 mLs (0.5 mg total) by nebulization 2 (two) times daily. (Patient not taking: Reported on  02/11/2023) 75 mL 12   sodium chloride 1 g tablet Take 1 tablet (1 g total) by mouth 3 (three) times daily with meals. (Patient taking differently: Take 1 g by mouth 3 (three) times daily with meals. Once daily) 30 tablet 1   No current facility-administered medications for this visit.    PHYSICAL EXAMINATION: ECOG PERFORMANCE STATUS: 1 - Symptomatic but completely ambulatory  Vitals:   03/19/23 1010  BP: 129/73  Pulse: 95  Resp: 18  Temp: 97.8 F (36.6 C)  SpO2: 99%   Filed Weights   03/19/23 1010  Weight: 146 lb (66.2 kg)      LABORATORY DATA:  I have reviewed the data as listed    Latest Ref Rng & Units 02/26/2023   10:03 AM 02/23/2023    2:09 PM 02/19/2023    7:52 AM  CMP  Glucose 70 - 99 mg/dL 161  096  045   BUN 8 - 23 mg/dL 8  13  11    Creatinine 0.44 - 1.00 mg/dL 4.09  8.11  9.14   Sodium 135 - 145 mmol/L 134  134  135   Potassium 3.5 - 5.1 mmol/L 4.3  4.1  4.2   Chloride 98 - 111 mmol/L 105  105  105   CO2 22 - 32 mmol/L 22  24  24    Calcium 8.9 - 10.3 mg/dL 8.2  8.5  8.1   Total Protein 6.5 - 8.1 g/dL 5.6  5.6  5.2   Total Bilirubin 0.3 - 1.2 mg/dL 1.9  1.8  2.0   Alkaline Phos 38 - 126 U/L 289  281  277   AST 15 - 41 U/L 83  79  90   ALT 0 - 44 U/L 27  26  31      Lab Results  Component Value Date   WBC 3.1 (L) 03/19/2023   HGB 10.0 (L) 03/19/2023   HCT 29.8 (L) 03/19/2023   MCV 112.0 (H) 03/19/2023   PLT 104 (L) 03/19/2023   NEUTROABS 2.1 03/19/2023    ASSESSMENT & PLAN:  Malignant neoplasm of lower-inner quadrant of right breast of female, estrogen receptor positive (HCC) 07/03/2019: Right breast LIQ T1CN0 stage Ia grade 2 IDC ER 95% PR negative HER2 negative Ki-67 5% 10/04/2019: Right lumpectomy: T2N1 stage IIb grade 2 IDC positive lymphovascular invasion 3 lymph nodes removed 1 with macro N1 micrometastatic disease 10/04/2019: MammaPrint: Low risk 01/05/2020: Completed adjuvant radiation 08/04/2019: Genetics: Negative November 2020:  Tamoxifen ------------------------------------------------------------------- 09/28/2022: Patient presented with platelets 65 INR 1.9 elevated LFTs and hypercalcemia  CT CAP 09/29/2022: Mediastinal and hilar lymphadenopathy suggestive of metastatic disease.  Patchy tree-in-bud nodularity in the lungs, partially necrotic mass right hepatic lobe 12 cm, also 2.5 cm lesion lytic bone metastases in spine pelvis and left scapula  Treatment plan: Liver biopsy: Poorly diff cancer ER 30-40%, PR:0%, Her 2 Neg Verzinio with AI started 10/24/2022 discontinued May 2024 due to progression in the liver 3.  Bone Marrow Biopsy: 10/13/22: Positive for met breast cancer:   4.  Hospitalization 01/11/2023-01/21/2023: Severe jaundice due to progression in the liver 5.  Perative radiation to the spine  -------------------------------------------------------------------------------------------------------------------- Current treatment: Enhertu cycle 4 (started 01/15/2023) Enhertu toxicities: None   Lab review:  01/26/2023: Hemoglobin 9.3, platelets 35, AST 171, ALT 60, alkaline phosphatase 398, total bilirubin 2.8 (improved from 5) 02/05/2023: Hemoglobin 9.8, platelets 52, AST 134, ALT 38, bilirubin 2.5, alkaline phosphatase 358, albumin 3.1   Appetite and energy levels are slowly improving with time.   Home oxygen: Requires 2 L/min   Tachycardia with minimal activity and exertion. Ascites: S/P paracentesis 03/02/23 Insomnia issues: Takes lorazepam Constipation: Stool softener MiraLAX   CT CAP 03/11/2023: Significant interval decrease in size of bulky liver masses, consistent with treatment response of hepatic metastatic disease (12.2 cm to 9.1 cm, 4.7 cm is now 2.9 cm), mod ascites, bone mets unchanged  She's now able to walk without a wheelchair and has not needed oxygen. Also rode her horse. Thinking about going to [place] for a few days with her husband.  Return to clinic in 3 weeks for cycle 5    No orders of  the defined types were placed in this encounter.  The patient has a good understanding of the overall plan. she agrees with it. she will call with any problems that may develop before the next visit here. Total time spent: 30 mins including face to face time and time spent for planning, charting and co-ordination of care   Tamsen Meek, MD 03/19/23    I Janan Ridge am acting as a Neurosurgeon for [employer]  I have reviewed the above documentation for accuracy and completeness, and I agree with the above.

## 2023-03-18 MED FILL — Dexamethasone Sodium Phosphate Inj 100 MG/10ML: INTRAMUSCULAR | Qty: 1 | Status: AC

## 2023-03-18 MED FILL — Fosaprepitant Dimeglumine For IV Infusion 150 MG (Base Eq): INTRAVENOUS | Qty: 5 | Status: AC

## 2023-03-19 ENCOUNTER — Inpatient Hospital Stay (HOSPITAL_BASED_OUTPATIENT_CLINIC_OR_DEPARTMENT_OTHER): Payer: Medicare HMO | Admitting: Hematology and Oncology

## 2023-03-19 ENCOUNTER — Inpatient Hospital Stay: Payer: Medicare HMO

## 2023-03-19 ENCOUNTER — Other Ambulatory Visit: Payer: Self-pay

## 2023-03-19 VITALS — BP 129/73 | HR 95 | Temp 97.8°F | Resp 18 | Ht 66.0 in | Wt 146.0 lb

## 2023-03-19 VITALS — BP 137/79 | HR 87 | Resp 16

## 2023-03-19 DIAGNOSIS — C7951 Secondary malignant neoplasm of bone: Secondary | ICD-10-CM

## 2023-03-19 DIAGNOSIS — Z170 Estrogen receptor positive status [ER+]: Secondary | ICD-10-CM

## 2023-03-19 DIAGNOSIS — Z5111 Encounter for antineoplastic chemotherapy: Secondary | ICD-10-CM | POA: Diagnosis not present

## 2023-03-19 DIAGNOSIS — C419 Malignant neoplasm of bone and articular cartilage, unspecified: Secondary | ICD-10-CM

## 2023-03-19 DIAGNOSIS — C50311 Malignant neoplasm of lower-inner quadrant of right female breast: Secondary | ICD-10-CM | POA: Diagnosis not present

## 2023-03-19 LAB — CMP (CANCER CENTER ONLY)
ALT: 18 U/L (ref 0–44)
AST: 53 U/L — ABNORMAL HIGH (ref 15–41)
Albumin: 3.2 g/dL — ABNORMAL LOW (ref 3.5–5.0)
Alkaline Phosphatase: 215 U/L — ABNORMAL HIGH (ref 38–126)
Anion gap: 6 (ref 5–15)
BUN: 7 mg/dL — ABNORMAL LOW (ref 8–23)
CO2: 23 mmol/L (ref 22–32)
Calcium: 8.5 mg/dL — ABNORMAL LOW (ref 8.9–10.3)
Chloride: 106 mmol/L (ref 98–111)
Creatinine: 0.54 mg/dL (ref 0.44–1.00)
GFR, Estimated: >60 mL/min (ref >60)
Glucose, Bld: 168 mg/dL — ABNORMAL HIGH (ref 70–99)
Potassium: 4.2 mmol/L (ref 3.5–5.1)
Sodium: 135 mmol/L (ref 135–145)
Total Bilirubin: 1.4 mg/dL — ABNORMAL HIGH (ref 0.3–1.2)
Total Protein: 5.5 g/dL — ABNORMAL LOW (ref 6.5–8.1)

## 2023-03-19 LAB — CBC WITH DIFFERENTIAL (CANCER CENTER ONLY)
Abs Immature Granulocytes: 0.01 10*3/uL (ref 0.00–0.07)
Basophils Absolute: 0 10*3/uL (ref 0.0–0.1)
Basophils Relative: 1 %
Eosinophils Absolute: 0 10*3/uL (ref 0.0–0.5)
Eosinophils Relative: 1 %
HCT: 29.8 % — ABNORMAL LOW (ref 36.0–46.0)
Hemoglobin: 10 g/dL — ABNORMAL LOW (ref 12.0–15.0)
Immature Granulocytes: 0 %
Lymphocytes Relative: 17 %
Lymphs Abs: 0.5 10*3/uL — ABNORMAL LOW (ref 0.7–4.0)
MCH: 37.6 pg — ABNORMAL HIGH (ref 26.0–34.0)
MCHC: 33.6 g/dL (ref 30.0–36.0)
MCV: 112 fL — ABNORMAL HIGH (ref 80.0–100.0)
Monocytes Absolute: 0.4 10*3/uL (ref 0.1–1.0)
Monocytes Relative: 11 %
Neutro Abs: 2.1 10*3/uL (ref 1.7–7.7)
Neutrophils Relative %: 70 %
Platelet Count: 104 10*3/uL — ABNORMAL LOW (ref 150–400)
RBC: 2.66 MIL/uL — ABNORMAL LOW (ref 3.87–5.11)
RDW: 19.3 % — ABNORMAL HIGH (ref 11.5–15.5)
WBC Count: 3.1 10*3/uL — ABNORMAL LOW (ref 4.0–10.5)
nRBC: 0 % (ref 0.0–0.2)

## 2023-03-19 MED ORDER — SODIUM CHLORIDE 0.9% FLUSH
10.0000 mL | Freq: Once | INTRAVENOUS | Status: AC
  Administered 2023-03-19: 10 mL

## 2023-03-19 MED ORDER — FAM-TRASTUZUMAB DERUXTECAN-NXKI CHEMO 100 MG IV SOLR
4.4000 mg/kg | Freq: Once | INTRAVENOUS | Status: AC
  Administered 2023-03-19: 300 mg via INTRAVENOUS
  Filled 2023-03-19: qty 15

## 2023-03-19 MED ORDER — PALONOSETRON HCL INJECTION 0.25 MG/5ML
0.2500 mg | Freq: Once | INTRAVENOUS | Status: AC
  Administered 2023-03-19: 0.25 mg via INTRAVENOUS

## 2023-03-19 MED ORDER — SODIUM CHLORIDE 0.9 % IV SOLN
10.0000 mg | Freq: Once | INTRAVENOUS | Status: AC
  Administered 2023-03-19: 10 mg via INTRAVENOUS
  Filled 2023-03-19: qty 10

## 2023-03-19 MED ORDER — DENOSUMAB 120 MG/1.7ML ~~LOC~~ SOLN
120.0000 mg | Freq: Once | SUBCUTANEOUS | Status: AC
  Administered 2023-03-19: 120 mg via SUBCUTANEOUS

## 2023-03-19 MED ORDER — DEXTROSE 5 % IV SOLN: Freq: Once | INTRAVENOUS | Status: AC

## 2023-03-19 MED ORDER — HEPARIN SOD (PORK) LOCK FLUSH 100 UNIT/ML IV SOLN
500.0000 [IU] | Freq: Once | INTRAVENOUS | Status: AC | PRN
  Administered 2023-03-19: 500 [IU]

## 2023-03-19 MED ORDER — DIPHENHYDRAMINE HCL 25 MG PO CAPS
50.0000 mg | ORAL_CAPSULE | Freq: Once | ORAL | Status: AC
  Administered 2023-03-19: 50 mg via ORAL

## 2023-03-19 MED ORDER — ACETAMINOPHEN 325 MG PO TABS
650.0000 mg | ORAL_TABLET | Freq: Once | ORAL | Status: AC
  Administered 2023-03-19: 650 mg via ORAL

## 2023-03-19 MED ORDER — SODIUM CHLORIDE 0.9% FLUSH
10.0000 mL | INTRAVENOUS | Status: DC | PRN
  Administered 2023-03-19: 10 mL

## 2023-03-19 MED ORDER — SODIUM CHLORIDE 0.9 % IV SOLN
150.0000 mg | Freq: Once | INTRAVENOUS | Status: AC
  Administered 2023-03-19: 150 mg via INTRAVENOUS
  Filled 2023-03-19: qty 150

## 2023-03-19 NOTE — Progress Notes (Signed)
Per Dr Pamelia Hoit, proceed with Rivka Barbara injection today.

## 2023-03-19 NOTE — Assessment & Plan Note (Signed)
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  Patchy tree-in-bud nodularity in the lungs, partially necrotic mass right hepatic lobe 12 cm, also 2.5 cm lesion lytic bone metastases in spine pelvis and left scapula   Treatment plan: Liver biopsy: Poorly diff cancer ER 30-40%, PR:0%, Her 2 Neg Verzinio with AI started 10/24/2022 discontinued May 2024 due to progression in the liver 3.  Bone Marrow Biopsy: 10/13/22: Positive for met breast cancer:   4.  Hospitalization 01/11/2023-01/21/2023: Severe jaundice due to progression in the liver 5.  Perative radiation to the spine  -------------------------------------------------------------------------------------------------------------------- Current treatment: Enhertu cycle 4 (started 01/15/2023) Enhertu toxicities: None   Lab review:  01/26/2023: Hemoglobin 9.3, platelets 35, AST 171, ALT 60, alkaline phosphatase 398, total bilirubin 2.8 (improved from 5) 02/05/2023: Hemoglobin 9.8, platelets 52, AST 134, ALT 38, bilirubin 2.5, alkaline phosphatase 358, albumin 3.1   Appetite and energy levels are slowly improving with time.   Home oxygen: Requires 2 L/min   Tachycardia with minimal activity and exertion. Ascites: S/P paracentesis 03/02/23 Insomnia issues: Takes lorazepam Constipation: Stool softener MiraLAX   CT CAP 03/11/2023: Significant interval decrease in size of bulky liver  masses, consistent with treatment response of hepatic metastatic disease (12.2 cm to 9.1 cm, 4.7 cm is now 2.9 cm), mod ascites, bone mets unchanged  Return to clinic in 3 weeks for cycle 5

## 2023-03-19 NOTE — Patient Instructions (Signed)
[facility]  Discharge Instructions: Thank you for choosing [facility] to provide your oncology and hematology care.   If you have a lab appointment with the Cancer Center, please go directly to the Cancer Center and check in at the registration area.   Wear comfortable clothing and clothing appropriate for easy access to any Portacath or PICC line.   We strive to give you quality time with your provider. You may need to reschedule your appointment if you arrive late (15 or more minutes).  Arriving late affects you and other patients whose appointments are after yours.  Also, if you miss three or more appointments without notifying the office, you may be dismissed from the clinic at the provider's discretion.      For prescription refill requests, have your pharmacy contact our office and allow 72 hours for refills to be completed.    Today you received the following chemotherapy and/or immunotherapy agents: fam-trastuzumab deruxtecan-nxki      To help prevent nausea and vomiting after your treatment, we encourage you to take your nausea medication as directed.  BELOW ARE SYMPTOMS THAT SHOULD BE REPORTED IMMEDIATELY: *FEVER GREATER THAN 100.4 F (38 C) OR HIGHER *CHILLS OR SWEATING *NAUSEA AND VOMITING THAT IS NOT CONTROLLED WITH YOUR NAUSEA MEDICATION *UNUSUAL SHORTNESS OF BREATH *UNUSUAL BRUISING OR BLEEDING *URINARY PROBLEMS (pain or burning when urinating, or frequent urination) *BOWEL PROBLEMS (unusual diarrhea, constipation, pain near the anus) TENDERNESS IN MOUTH AND THROAT WITH OR WITHOUT PRESENCE OF ULCERS (sore throat, sores in mouth, or a toothache) UNUSUAL RASH, SWELLING OR PAIN  UNUSUAL VAGINAL DISCHARGE OR ITCHING   Items with * indicate a potential emergency and should be followed up as soon as possible or go to the Emergency Department if any problems should occur.  Please show the CHEMOTHERAPY ALERT CARD or  IMMUNOTHERAPY ALERT CARD at check-in to the Emergency Department and triage nurse.  Should you have questions after your visit or need to cancel or reschedule your appointment, please contact [facility]  Dept: [phone]  and follow the prompts.  Office hours are 8:00 a.m. to 4:30 p.m. Monday - Friday. Please note that voicemails left after 4:00 p.m. may not be returned until the following business day.  We are closed weekends and major holidays. You have access to a nurse at all times for urgent questions. Please call the main number to the clinic Dept: [phone] and follow the prompts.   For any non-urgent questions, you may also contact your provider using MyChart. We now offer e-Visits for anyone 18 and older to request care online for non-urgent symptoms. For details visit [email or web address].   Also download the MyChart app! Go to the app store, search "MyChart", open the app, select , and log in with your MyChart username and password.   

## 2023-03-24 ENCOUNTER — Telehealth: Payer: Self-pay | Admitting: Hematology and Oncology

## 2023-03-24 NOTE — Telephone Encounter (Signed)
Scheduled appointment per WQ. Patient is aware of the made appointments.  

## 2023-04-02 ENCOUNTER — Other Ambulatory Visit: Payer: Self-pay | Admitting: Hematology and Oncology

## 2023-04-02 MED ORDER — VENLAFAXINE HCL ER 37.5 MG PO CP24: 37.5000 mg | ORAL_CAPSULE | Freq: Every day | ORAL | 4 refills | Status: DC

## 2023-04-02 MED ORDER — LORAZEPAM 0.5 MG PO TABS: 0.5000 mg | ORAL_TABLET | Freq: Three times a day (TID) | ORAL | 3 refills | Status: DC | PRN

## 2023-04-02 NOTE — Telephone Encounter (Signed)
Called pt's husband to clarify rx's. Pt is no longer taking letrozole per MD. Pt's Lorazepam and venlafaxine refilled per MD. Pt's husband verbalized thanks and understanding.

## 2023-04-05 ENCOUNTER — Ambulatory Visit: Payer: Medicare HMO | Admitting: Pulmonary Disease

## 2023-04-06 NOTE — Progress Notes (Signed)
Patient Care Team: Carylon Perches, MD as PCP - General (Internal Medicine) Almond Lint, MD as Consulting Physician (General Surgery) Lonie Peak, MD as Attending Physician (Radiation Oncology) Patton Salles, MD as Consulting Physician (Obstetrics and Gynecology) Venancio Poisson, MD as Consulting Physician (Dermatology) Crist Fat, MD as Consulting Physician (Urology) Charna Elizabeth, MD as Consulting Physician (Gastroenterology) Sheral Apley, MD as Attending Physician (Orthopedic Surgery) Serena Croissant, MD as Consulting Physician (Hematology and Oncology)  DIAGNOSIS:  Encounter Diagnoses  Name Primary?   Malignant neoplasm of lower-inner quadrant of right breast of female, estrogen receptor positive (HCC) Yes   Cancer, metastatic to bone (HCC)     SUMMARY OF ONCOLOGIC HISTORY: Oncology History  Malignant neoplasm of lower-inner quadrant of right breast of female, estrogen receptor positive (HCC)  07/03/2019 Initial Diagnosis   right lower inner quadrant biopsy, for a clinicaly multiple T1c N0, stage IA invasive ductal carcinoma, grade 2, E-cadherin positive, strongly estrogen receptor positive but progesterone receptor negative and HER-2 not amplified, with an MIB-1 of 5%             (a) breast MRI 08/07/2019 showed 2 additional suspicious areas in the right breast and 1 in the left breast              (b) biopsy of all 3 areas 08/21/2019 showed no malignancy   07/26/2019 -  Anti-estrogen oral therapy   tamoxifen started neoadjuvantly 07/26/2019; to be continued for 10 years.  S/p TAH/BSO on 01/16/2020 with benign pathology   08/04/2019 Genetic Testing   Negative genetic testing:  No pathogenic variants detected on the [facility] Breast Cancer STAT panel or the Common Hereditary Cancers panel. The report date is 08/04/2019.  The STAT Breast cancer panel offered by [facility] includes sequencing and rearrangement analysis for the following 9 genes:  ATM, BRCA1, BRCA2,  CDH1, CHEK2, PALB2, PTEN, STK11 and TP53.  The Common Hereditary Cancers Panel offered by [facility] includes sequencing and/or deletion duplication testing of the following 48 genes: APC, ATM, AXIN2, BARD1, BMPR1A, BRCA1, BRCA2, BRIP1, CDH1, CDK4, CDKN2A (p14ARF), CDKN2A (p16INK4a), CHEK2, CTNNA1, DICER1, EPCAM (Deletion/duplication testing only), GREM1 (promoter region deletion/duplication testing only), KIT, MEN1, MLH1, MSH2, MSH3, MSH6, MUTYH, NBN, NF1, NHTL1, PALB2, PDGFRA, PMS2, POLD1, POLE, PTEN, RAD50, RAD51C, RAD51D, RNF43, SDHB, SDHC, SDHD, SMAD4, SMARCA4. STK11, TP53, TSC1, TSC2, and VHL.  The following genes were evaluated for sequence changes only: SDHA and HOXB13 c.251G>A variant only.    10/04/2019 Cancer Staging   Staging form: Breast, AJCC 8th Edition - Pathologic stage from 10/04/2019: Stage IIB (pT2, pN1a(sn), cM0, G2, ER+, PR-, HER2-) - Signed by Loa Socks, NP on 10/18/2019   10/04/2019 Surgery    right lumpectomy and sentinel lymph node sampling 10/04/2019 showed a pT2 pN1, stage IIB invasive ductal carcinoma, grade 2, with positive lymphovascular invasion and a positive superior margin             (a) 3 sentinel lymph nodes removed, one with macro metastatic deposit, 1 with a micrometastatic deposit             (b) additional surgery for margin clearance   10/04/2019 Miscellaneous    MammaPrint on the 10/04/2019 sample shows a low risk luminal A tumor predicting a 5-year metastasis free survival of 96% without chemotherapy, and a chemotherapy benefit of less than 1.5%    11/27/2019 - 01/05/2020 Radiation Therapy    Site Technique Total Dose (Gy) Dose per Fx (Gy) Completed Fx Beam Energies  Breast, Right: Breast_Rt 3D 50/50 2 25/25 6X, 10X  Breast, Right: Breast_Rt_SCV_PAB 3D 50/50 2 25/25 6X, 10X  Breast, Right: Breast_Rt_Bst 3D 10/10 2 5/5 6X, 10X     09/29/2022 Imaging   IMPRESSION: 1. Surgical changes involving the right medial breast. No obvious recurrent breast mass  or chest wall mass. 2. Mediastinal and hilar lymphadenopathy suggesting metastatic disease. 3. Patchy tree-in-bud type nodularity in the lungs suggesting chronic inflammation or atypical infection such as MAC. No definite pulmonary metastatic nodules. 4. Large partially necrotic mass involving most of the right hepatic lobe and a 2.5 cm lesion in segment 3 of the liver consistent with metastatic disease. 5. 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   10/06/2022 Initial Biopsy   Liver biopsy: + for malignancy, metastatic poorly differentiated carcinoma, likely breast origin, biomarkers pending, insufficient tissue for molecular testing. (Resulted in EPIC on 10/13/2022)   10/07/2022 PET scan   PET scan on 10/07/2022 that showed hypermetabolic metastatic breast cancer with hypermetabolic mediastinal and hilar adenopathy, hypermetabolic hepatic metastatic disease and bone disease.  There were no findings for pulmonary metastatic disease.   10/21/2022 -  Anti-estrogen oral therapy   Verzinio and Letrozole   01/15/2023 -  Chemotherapy   Patient is on Treatment Plan : BREAST METASTATIC Fam-Trastuzumab Deruxtecan-nxki (Enhertu) (5.4) q21d     02/19/2023 Cancer Staging   Staging form: Breast, AJCC 8th Edition - Pathologic: Stage IV (pM1) - Signed by Loa Socks, NP on 02/19/2023   Cancer, metastatic to bone (HCC)  12/21/2022 Initial Diagnosis   Cancer, metastatic to bone (HCC)   01/15/2023 -  Chemotherapy   Patient is on Treatment Plan : BREAST METASTATIC Fam-Trastuzumab Deruxtecan-nxki (Enhertu) (5.4) q21d       CHIEF COMPLIANT:  Cycle 5 Enhertu   INTERVAL HISTORY: Theresa [name] is a 67 y.o female with the above-mentioned Estrogen receptor positive HER2 low metastatic breast cancer. Currently on Enhertu. She presents to the clinic for a follow-up.     ALLERGIES:  is allergic to metronidazole, other, and tape.  MEDICATIONS:  Current  Outpatient Medications  Medication Sig Dispense Refill   dexamethasone (DECADRON) 4 MG tablet Take 1 tablet (4 mg total) by mouth daily. Take 1 tablet day chemotherapy 1 tablet 2 days after chemo with food 30 tablet 0   docusate sodium (COLACE) 100 MG capsule Take 1 capsule (100 mg total) by mouth daily. 30 capsule 2   furosemide (LASIX) 20 MG tablet TAKE 1 TABLET BY MOUTH DAILY WITH BREAKFAST 90 tablet 1   guaiFENesin (MUCINEX) 600 MG 12 hr tablet Take 600 mg by mouth 2 (two) times daily. (Patient not taking: Reported on 02/11/2023)     ipratropium (ATROVENT) 0.02 % nebulizer solution Take 2.5 mLs (0.5 mg total) by nebulization 2 (two) times daily. (Patient not taking: Reported on 02/11/2023) 75 mL 12   LORazepam (ATIVAN) 0.5 MG tablet Take 1 tablet (0.5 mg total) by mouth every 8 (eight) hours as needed. for anxiety 15 tablet 3   omeprazole (PRILOSEC) 20 MG capsule Take 1 capsule (20 mg total) by mouth 2 (two) times daily before a meal. 60 capsule 2   ondansetron (ZOFRAN) 8 MG tablet Take 1 tablet (8 mg total) by mouth every 8 (eight) hours as needed for nausea or vomiting. 30 tablet 2   oxyCODONE-acetaminophen (PERCOCET/ROXICET) 5-325 MG tablet Take 1 tablet by mouth every 6 (six) hours as needed for severe pain. 30 tablet 0   sodium  chloride 1 g tablet Take 1 tablet (1 g total) by mouth 3 (three) times daily with meals. (Patient taking differently: Take 1 g by mouth 3 (three) times daily with meals. Once daily) 30 tablet 1   spironolactone (ALDACTONE) 25 MG tablet TAKE 1 TABLET (25 MG TOTAL) BY MOUTH DAILY. 90 tablet 1   venlafaxine XR (EFFEXOR-XR) 37.5 MG 24 hr capsule Take 1 capsule (37.5 mg total) by mouth daily. 90 capsule 4   No current facility-administered medications for this visit.    PHYSICAL EXAMINATION: ECOG PERFORMANCE STATUS: 1 - Symptomatic but completely ambulatory  Vitals:   04/09/23 1025  BP: 126/65  Pulse: 94  Resp: 18  Temp: 97.8 F (36.6 C)  SpO2: 100%   Filed  Weights   04/09/23 1025  Weight: 140 lb (63.5 kg)      LABORATORY DATA:  I have reviewed the data as listed    Latest Ref Rng & Units 04/09/2023    9:40 AM 03/19/2023    9:56 AM 02/26/2023   10:03 AM  CMP  Glucose 70 - 99 mg/dL 621  308  657   BUN 8 - 23 mg/dL 8  7  8    Creatinine 0.44 - 1.00 mg/dL 8.46  9.62  9.52   Sodium 135 - 145 mmol/L 135  135  134   Potassium 3.5 - 5.1 mmol/L 4.6  4.2  4.3   Chloride 98 - 111 mmol/L 105  106  105   CO2 22 - 32 mmol/L 25  23  22    Calcium 8.9 - 10.3 mg/dL 8.5  8.5  8.2   Total Protein 6.5 - 8.1 g/dL 5.6  5.5  5.6   Total Bilirubin 0.3 - 1.2 mg/dL 1.5  1.4  1.9   Alkaline Phos 38 - 126 U/L 216  215  289   AST 15 - 41 U/L 43  53  83   ALT 0 - 44 U/L 18  18  27      Lab Results  Component Value Date   WBC 3.0 (L) 04/09/2023   HGB 10.6 (L) 04/09/2023   HCT 30.6 (L) 04/09/2023   MCV 110.1 (H) 04/09/2023   PLT 127 (L) 04/09/2023   NEUTROABS PENDING 04/09/2023    ASSESSMENT & PLAN:  Malignant neoplasm of lower-inner quadrant of right breast of female, estrogen receptor positive (HCC) 07/03/2019: Right breast LIQ T1CN0 stage Ia grade 2 IDC ER 95% PR negative HER2 negative Ki-67 5% 10/04/2019: Right lumpectomy: T2N1 stage IIb grade 2 IDC positive lymphovascular invasion 3 lymph nodes removed 1 with macro N1 micrometastatic disease 10/04/2019: MammaPrint: Low risk 01/05/2020: Completed adjuvant radiation 08/04/2019: Genetics: Negative November 2020: Tamoxifen ------------------------------------------------------------------- 09/28/2022: Patient presented with platelets 65 INR 1.9 elevated LFTs and hypercalcemia  CT CAP 09/29/2022: Mediastinal and hilar lymphadenopathy suggestive of metastatic disease.  Patchy tree-in-bud nodularity in the lungs, partially necrotic mass right hepatic lobe 12 cm, also 2.5 cm lesion lytic bone metastases in spine pelvis and left scapula   Treatment plan: Liver biopsy: Poorly diff cancer ER 30-40%, PR:0%, Her 2  Neg Verzinio with AI started 10/24/2022 discontinued May 2024 due to progression in the liver 3.  Bone Marrow Biopsy: 10/13/22: Positive for met breast cancer:   4.  Hospitalization 01/11/2023-01/21/2023: Severe jaundice due to progression in the liver 5.  Perative radiation to the spine  -------------------------------------------------------------------------------------------------------------------- Current treatment: Enhertu cycle 5 (started 01/15/2023) Enhertu toxicities: None   Lab review:  01/26/2023: Hemoglobin 9.3, platelets 35, AST 171, ALT  60, alkaline phosphatase 398, total bilirubin 2.8 (improved from 5) 02/05/2023: Hemoglobin 9.8, platelets 52, AST 134, ALT 38, bilirubin 2.5, alkaline phosphatase 358, albumin 3.1 04/09/2023: Hemoglobin 10.6, platelets 127, bilirubin 1.5, AST 43   Appetite and energy levels are slowly improving with time. Ascites: S/P paracentesis 03/02/23 Insomnia issues: Takes lorazepam Constipation: Stool softener MiraLAX   CT CAP 03/11/2023: Significant interval decrease in size of bulky liver masses, consistent with treatment response of hepatic metastatic disease (12.2 cm to 9.1 cm, 4.7 cm is now 2.9 cm), mod ascites, bone mets unchanged   She's now able to walk without a wheelchair and has not needed oxygen. Also rode her horse.   Will consider doing a PET CT scan in October  Return to clinic in 3 weeks for cycle 6    No orders of the defined types were placed in this encounter.  The patient has a good understanding of the overall plan. she agrees with it. she will call with any problems that may develop before the next visit here. Total time spent: 30 mins including face to face time and time spent for planning, charting and co-ordination of care   Tamsen Meek, MD 04/09/23   I have reviewed the above documentation for accuracy and completeness, and I agree with the above.

## 2023-04-08 MED FILL — Fosaprepitant Dimeglumine For IV Infusion 150 MG (Base Eq): INTRAVENOUS | Qty: 5 | Status: AC

## 2023-04-08 MED FILL — Dexamethasone Sodium Phosphate Inj 100 MG/10ML: INTRAMUSCULAR | Qty: 1 | Status: AC

## 2023-04-09 ENCOUNTER — Encounter: Payer: Self-pay | Admitting: Hematology and Oncology

## 2023-04-09 ENCOUNTER — Inpatient Hospital Stay: Payer: Medicare HMO | Admitting: Dietician

## 2023-04-09 ENCOUNTER — Inpatient Hospital Stay: Payer: Medicare HMO | Attending: Hematology and Oncology | Admitting: Hematology and Oncology

## 2023-04-09 ENCOUNTER — Other Ambulatory Visit: Payer: Self-pay

## 2023-04-09 ENCOUNTER — Inpatient Hospital Stay: Payer: Medicare HMO

## 2023-04-09 VITALS — BP 126/65 | HR 94 | Temp 97.8°F | Resp 18 | Ht 66.0 in | Wt 140.0 lb

## 2023-04-09 VITALS — BP 122/68 | HR 78 | Temp 98.2°F | Resp 18

## 2023-04-09 DIAGNOSIS — C419 Malignant neoplasm of bone and articular cartilage, unspecified: Secondary | ICD-10-CM

## 2023-04-09 DIAGNOSIS — Z170 Estrogen receptor positive status [ER+]: Secondary | ICD-10-CM | POA: Diagnosis not present

## 2023-04-09 DIAGNOSIS — C7951 Secondary malignant neoplasm of bone: Secondary | ICD-10-CM | POA: Diagnosis not present

## 2023-04-09 DIAGNOSIS — Z7952 Long term (current) use of systemic steroids: Secondary | ICD-10-CM | POA: Diagnosis not present

## 2023-04-09 DIAGNOSIS — C787 Secondary malignant neoplasm of liver and intrahepatic bile duct: Secondary | ICD-10-CM | POA: Insufficient documentation

## 2023-04-09 DIAGNOSIS — Z79899 Other long term (current) drug therapy: Secondary | ICD-10-CM | POA: Diagnosis not present

## 2023-04-09 DIAGNOSIS — C50311 Malignant neoplasm of lower-inner quadrant of right female breast: Secondary | ICD-10-CM | POA: Diagnosis not present

## 2023-04-09 DIAGNOSIS — Z5112 Encounter for antineoplastic immunotherapy: Secondary | ICD-10-CM | POA: Diagnosis present

## 2023-04-09 DIAGNOSIS — Z923 Personal history of irradiation: Secondary | ICD-10-CM | POA: Diagnosis not present

## 2023-04-09 LAB — CBC WITH DIFFERENTIAL (CANCER CENTER ONLY)
Abs Immature Granulocytes: 0.02 10*3/uL (ref 0.00–0.07)
Basophils Absolute: 0 10*3/uL (ref 0.0–0.1)
Basophils Relative: 1 %
Eosinophils Absolute: 0.1 10*3/uL (ref 0.0–0.5)
Eosinophils Relative: 2 %
HCT: 30.6 % — ABNORMAL LOW (ref 36.0–46.0)
Hemoglobin: 10.6 g/dL — ABNORMAL LOW (ref 12.0–15.0)
Immature Granulocytes: 1 %
Lymphocytes Relative: 26 %
Lymphs Abs: 0.8 10*3/uL (ref 0.7–4.0)
MCH: 38.1 pg — ABNORMAL HIGH (ref 26.0–34.0)
MCHC: 34.6 g/dL (ref 30.0–36.0)
MCV: 110.1 fL — ABNORMAL HIGH (ref 80.0–100.0)
Monocytes Absolute: 0.4 10*3/uL (ref 0.1–1.0)
Monocytes Relative: 14 %
Neutro Abs: 1.7 10*3/uL (ref 1.7–7.7)
Neutrophils Relative %: 56 %
Platelet Count: 127 10*3/uL — ABNORMAL LOW (ref 150–400)
RBC: 2.78 MIL/uL — ABNORMAL LOW (ref 3.87–5.11)
RDW: 17.7 % — ABNORMAL HIGH (ref 11.5–15.5)
Smear Review: NORMAL
WBC Count: 3 10*3/uL — ABNORMAL LOW (ref 4.0–10.5)
nRBC: 0 % (ref 0.0–0.2)

## 2023-04-09 LAB — CMP (CANCER CENTER ONLY)
ALT: 18 U/L (ref 0–44)
AST: 43 U/L — ABNORMAL HIGH (ref 15–41)
Albumin: 3.4 g/dL — ABNORMAL LOW (ref 3.5–5.0)
Alkaline Phosphatase: 216 U/L — ABNORMAL HIGH (ref 38–126)
Anion gap: 5 (ref 5–15)
BUN: 8 mg/dL (ref 8–23)
CO2: 25 mmol/L (ref 22–32)
Calcium: 8.5 mg/dL — ABNORMAL LOW (ref 8.9–10.3)
Chloride: 105 mmol/L (ref 98–111)
Creatinine: 0.55 mg/dL (ref 0.44–1.00)
GFR, Estimated: >60 mL/min (ref >60)
Glucose, Bld: 105 mg/dL — ABNORMAL HIGH (ref 70–99)
Potassium: 4.6 mmol/L (ref 3.5–5.1)
Sodium: 135 mmol/L (ref 135–145)
Total Bilirubin: 1.5 mg/dL — ABNORMAL HIGH (ref 0.3–1.2)
Total Protein: 5.6 g/dL — ABNORMAL LOW (ref 6.5–8.1)

## 2023-04-09 MED ORDER — SODIUM CHLORIDE 0.9% FLUSH
10.0000 mL | Freq: Once | INTRAVENOUS | Status: AC
  Administered 2023-04-09: 10 mL

## 2023-04-09 MED ORDER — ACETAMINOPHEN 325 MG PO TABS
650.0000 mg | ORAL_TABLET | Freq: Once | ORAL | Status: AC
  Administered 2023-04-09: 650 mg via ORAL
  Filled 2023-04-09: qty 2

## 2023-04-09 MED ORDER — SODIUM CHLORIDE 0.9% FLUSH
10.0000 mL | INTRAVENOUS | Status: DC | PRN
  Administered 2023-04-09: 10 mL

## 2023-04-09 MED ORDER — DOCUSATE SODIUM 100 MG PO CAPS: 100.0000 mg | ORAL_CAPSULE | Freq: Every day | ORAL | 6 refills | Status: DC

## 2023-04-09 MED ORDER — SODIUM CHLORIDE 0.9 % IV SOLN
10.0000 mg | Freq: Once | INTRAVENOUS | Status: AC
  Administered 2023-04-09: 10 mg via INTRAVENOUS
  Filled 2023-04-09: qty 10

## 2023-04-09 MED ORDER — DEXTROSE 5 % IV SOLN: Freq: Once | INTRAVENOUS | Status: AC

## 2023-04-09 MED ORDER — FAM-TRASTUZUMAB DERUXTECAN-NXKI CHEMO 100 MG IV SOLR
4.4000 mg/kg | Freq: Once | INTRAVENOUS | Status: AC
  Administered 2023-04-09: 300 mg via INTRAVENOUS
  Filled 2023-04-09: qty 15

## 2023-04-09 MED ORDER — PALONOSETRON HCL INJECTION 0.25 MG/5ML
0.2500 mg | Freq: Once | INTRAVENOUS | Status: AC
  Administered 2023-04-09: 0.25 mg via INTRAVENOUS
  Filled 2023-04-09: qty 5

## 2023-04-09 MED ORDER — HEPARIN SOD (PORK) LOCK FLUSH 100 UNIT/ML IV SOLN
500.0000 [IU] | Freq: Once | INTRAVENOUS | Status: AC | PRN
  Administered 2023-04-09: 500 [IU]

## 2023-04-09 MED ORDER — DIPHENHYDRAMINE HCL 25 MG PO CAPS
50.0000 mg | ORAL_CAPSULE | Freq: Once | ORAL | Status: AC
  Administered 2023-04-09: 50 mg via ORAL
  Filled 2023-04-09: qty 2

## 2023-04-09 MED ORDER — SODIUM CHLORIDE 0.9 % IV SOLN
150.0000 mg | Freq: Once | INTRAVENOUS | Status: AC
  Administered 2023-04-09: 150 mg via INTRAVENOUS
  Filled 2023-04-09: qty 150

## 2023-04-09 NOTE — Patient Instructions (Signed)
[facility]  Discharge Instructions: Thank you for choosing [facility] to provide your oncology and hematology care.   If you have a lab appointment with the Cancer Center, please go directly to the Cancer Center and check in at the registration area.   Wear comfortable clothing and clothing appropriate for easy access to any Portacath or PICC line.   We strive to give you quality time with your provider. You may need to reschedule your appointment if you arrive late (15 or more minutes).  Arriving late affects you and other patients whose appointments are after yours.  Also, if you miss three or more appointments without notifying the office, you may be dismissed from the clinic at the provider's discretion.      For prescription refill requests, have your pharmacy contact our office and allow 72 hours for refills to be completed.    Today you received the following chemotherapy and/or immunotherapy agents Enhertu.   To help prevent nausea and vomiting after your treatment, we encourage you to take your nausea medication as directed.  BELOW ARE SYMPTOMS THAT SHOULD BE REPORTED IMMEDIATELY: *FEVER GREATER THAN 100.4 F (38 C) OR HIGHER *CHILLS OR SWEATING *NAUSEA AND VOMITING THAT IS NOT CONTROLLED WITH YOUR NAUSEA MEDICATION *UNUSUAL SHORTNESS OF BREATH *UNUSUAL BRUISING OR BLEEDING *URINARY PROBLEMS (pain or burning when urinating, or frequent urination) *BOWEL PROBLEMS (unusual diarrhea, constipation, pain near the anus) TENDERNESS IN MOUTH AND THROAT WITH OR WITHOUT PRESENCE OF ULCERS (sore throat, sores in mouth, or a toothache) UNUSUAL RASH, SWELLING OR PAIN  UNUSUAL VAGINAL DISCHARGE OR ITCHING   Items with * indicate a potential emergency and should be followed up as soon as possible or go to the Emergency Department if any problems should occur.  Please show the CHEMOTHERAPY ALERT CARD or IMMUNOTHERAPY ALERT CARD at check-in  to the Emergency Department and triage nurse.  Should you have questions after your visit or need to cancel or reschedule your appointment, please contact [facility]  Dept: [phone]  and follow the prompts.  Office hours are 8:00 a.m. to 4:30 p.m. Monday - Friday. Please note that voicemails left after 4:00 p.m. may not be returned until the following business day.  We are closed weekends and major holidays. You have access to a nurse at all times for urgent questions. Please call the main number to the clinic Dept: [phone] and follow the prompts.   For any non-urgent questions, you may also contact your provider using MyChart. We now offer e-Visits for anyone 18 and older to request care online for non-urgent symptoms. For details visit [email or web address].   Also download the MyChart app! Go to the app store, search "MyChart", open the app, select [facility], and log in with your MyChart username and password.   

## 2023-04-09 NOTE — Assessment & Plan Note (Signed)
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  Patchy tree-in-bud nodularity in the lungs, partially necrotic mass right hepatic lobe 12 cm, also 2.5 cm lesion lytic bone metastases in spine pelvis and left scapula   Treatment plan: Liver biopsy: Poorly diff cancer ER 30-40%, PR:0%, Her 2 Neg Verzinio with AI started 10/24/2022 discontinued May 2024 due to progression in the liver 3.  Bone Marrow Biopsy: 10/13/22: Positive for met breast cancer:   4.  Hospitalization 01/11/2023-01/21/2023: Severe jaundice due to progression in the liver 5.  Perative radiation to the spine  -------------------------------------------------------------------------------------------------------------------- Current treatment: Enhertu cycle 5 (started 01/15/2023) Enhertu toxicities: None   Lab review:  01/26/2023: Hemoglobin 9.3, platelets 35, AST 171, ALT 60, alkaline phosphatase 398, total bilirubin 2.8 (improved from 5) 02/05/2023: Hemoglobin 9.8, platelets 52, AST 134, ALT 38, bilirubin 2.5, alkaline phosphatase 358, albumin 3.1   Appetite and energy levels are slowly improving with time.   Home oxygen: Requires 2 L/min   Tachycardia with minimal activity and exertion. Ascites: S/P paracentesis 03/02/23 Insomnia issues: Takes lorazepam Constipation: Stool softener MiraLAX   CT CAP 03/11/2023: Significant interval decrease in size of bulky liver  masses, consistent with treatment response of hepatic metastatic disease (12.2 cm to 9.1 cm, 4.7 cm is now 2.9 cm), mod ascites, bone mets unchanged   She's now able to walk without a wheelchair and has not needed oxygen. Also rode her horse. Thinking about going to [place] for a few days with her husband.   Return to clinic in 3 weeks for cycle 6

## 2023-04-09 NOTE — Progress Notes (Signed)
Nutrition Follow-up:  Patient with metastatic breast cancer metastatic to bone. She is currently receiving enhertu q21d.   Abdominal ascites s/p multiple paracentesis  Met with patient and husband in infusion. Pt reports overall feeling much better. She no longer requires use of wheelchair. Pt has ridden horses a couple of times recently. She is eating more, however some foods still do not taste "quite right" such as meat and chicken. States she would like to have some crab legs. Pt is drinking some water, mostly tea and lemonade. She denies nausea, vomiting, constipation, diarrhea.  Medications: reviewed   Labs: albumin 3.4, total bilirubin 1.5  Anthropometrics: Pt 140 lb today (s/p paracentesis 7/2 - yield 1L)  7/19 - 146 lb  6/28 - 146 lb 6/13 - 140 lb 6.4 oz   NUTRITION DIAGNOSIS: Unintended wt loss stable    INTERVENTION:  Reviewed tips for altered taste Encouraged small frequent meals/snacks with adequate calories and protein Encouraged activity as able    MONITORING, EVALUATION, GOAL: wt trends, intake   NEXT VISIT: To be scheduled as needed

## 2023-04-17 ENCOUNTER — Other Ambulatory Visit: Payer: Self-pay | Admitting: Primary Care

## 2023-04-26 ENCOUNTER — Telehealth: Payer: Self-pay | Admitting: Hematology and Oncology

## 2023-04-26 NOTE — Telephone Encounter (Signed)
Rescheduled and canceled appointment per provider PAL. Patient is aware of the changes made to her upcoming appointments.

## 2023-04-29 MED FILL — Dexamethasone Sodium Phosphate Inj 100 MG/10ML: INTRAMUSCULAR | Qty: 1 | Status: AC

## 2023-04-29 MED FILL — Fosaprepitant Dimeglumine For IV Infusion 150 MG (Base Eq): INTRAVENOUS | Qty: 5 | Status: AC

## 2023-04-30 ENCOUNTER — Inpatient Hospital Stay: Payer: Medicare HMO | Admitting: Hematology and Oncology

## 2023-04-30 ENCOUNTER — Inpatient Hospital Stay: Payer: Medicare HMO

## 2023-04-30 VITALS — BP 133/75 | HR 94 | Temp 98.3°F | Resp 16 | Wt 143.2 lb

## 2023-04-30 DIAGNOSIS — C419 Malignant neoplasm of bone and articular cartilage, unspecified: Secondary | ICD-10-CM

## 2023-04-30 DIAGNOSIS — Z170 Estrogen receptor positive status [ER+]: Secondary | ICD-10-CM

## 2023-04-30 DIAGNOSIS — C7951 Secondary malignant neoplasm of bone: Secondary | ICD-10-CM

## 2023-04-30 DIAGNOSIS — Z5112 Encounter for antineoplastic immunotherapy: Secondary | ICD-10-CM | POA: Diagnosis not present

## 2023-04-30 DIAGNOSIS — C50311 Malignant neoplasm of lower-inner quadrant of right female breast: Secondary | ICD-10-CM

## 2023-04-30 LAB — CMP (CANCER CENTER ONLY)
ALT: 16 U/L (ref 0–44)
AST: 38 U/L (ref 15–41)
Albumin: 3.4 g/dL — ABNORMAL LOW (ref 3.5–5.0)
Alkaline Phosphatase: 161 U/L — ABNORMAL HIGH (ref 38–126)
Anion gap: 3 — ABNORMAL LOW (ref 5–15)
BUN: 7 mg/dL — ABNORMAL LOW (ref 8–23)
CO2: 28 mmol/L (ref 22–32)
Calcium: 8.6 mg/dL — ABNORMAL LOW (ref 8.9–10.3)
Chloride: 106 mmol/L (ref 98–111)
Creatinine: 0.54 mg/dL (ref 0.44–1.00)
GFR, Estimated: >60 mL/min (ref >60)
Glucose, Bld: 107 mg/dL — ABNORMAL HIGH (ref 70–99)
Potassium: 4.4 mmol/L (ref 3.5–5.1)
Sodium: 137 mmol/L (ref 135–145)
Total Bilirubin: 1.3 mg/dL — ABNORMAL HIGH (ref 0.3–1.2)
Total Protein: 5.7 g/dL — ABNORMAL LOW (ref 6.5–8.1)

## 2023-04-30 LAB — CBC WITH DIFFERENTIAL (CANCER CENTER ONLY)
Abs Immature Granulocytes: 0.02 10*3/uL (ref 0.00–0.07)
Basophils Absolute: 0 10*3/uL (ref 0.0–0.1)
Basophils Relative: 0 %
Eosinophils Absolute: 0.1 10*3/uL (ref 0.0–0.5)
Eosinophils Relative: 3 %
HCT: 30 % — ABNORMAL LOW (ref 36.0–46.0)
Hemoglobin: 10.5 g/dL — ABNORMAL LOW (ref 12.0–15.0)
Immature Granulocytes: 1 %
Lymphocytes Relative: 18 %
Lymphs Abs: 0.5 10*3/uL — ABNORMAL LOW (ref 0.7–4.0)
MCH: 38.5 pg — ABNORMAL HIGH (ref 26.0–34.0)
MCHC: 35 g/dL (ref 30.0–36.0)
MCV: 109.9 fL — ABNORMAL HIGH (ref 80.0–100.0)
Monocytes Absolute: 0.3 10*3/uL (ref 0.1–1.0)
Monocytes Relative: 12 %
Neutro Abs: 2 10*3/uL (ref 1.7–7.7)
Neutrophils Relative %: 66 %
Platelet Count: 118 10*3/uL — ABNORMAL LOW (ref 150–400)
RBC: 2.73 MIL/uL — ABNORMAL LOW (ref 3.87–5.11)
RDW: 17.2 % — ABNORMAL HIGH (ref 11.5–15.5)
WBC Count: 2.9 10*3/uL — ABNORMAL LOW (ref 4.0–10.5)
nRBC: 0 % (ref 0.0–0.2)

## 2023-04-30 MED ORDER — PALONOSETRON HCL INJECTION 0.25 MG/5ML
0.2500 mg | Freq: Once | INTRAVENOUS | Status: AC
  Administered 2023-04-30: 0.25 mg via INTRAVENOUS
  Filled 2023-04-30: qty 5

## 2023-04-30 MED ORDER — SODIUM CHLORIDE 0.9% FLUSH
10.0000 mL | INTRAVENOUS | Status: DC | PRN
  Administered 2023-04-30: 10 mL

## 2023-04-30 MED ORDER — SODIUM CHLORIDE 0.9 % IV SOLN
10.0000 mg | Freq: Once | INTRAVENOUS | Status: AC
  Administered 2023-04-30: 10 mg via INTRAVENOUS
  Filled 2023-04-30: qty 10

## 2023-04-30 MED ORDER — ACETAMINOPHEN 325 MG PO TABS
650.0000 mg | ORAL_TABLET | Freq: Once | ORAL | Status: AC
  Administered 2023-04-30: 650 mg via ORAL
  Filled 2023-04-30: qty 2

## 2023-04-30 MED ORDER — FAM-TRASTUZUMAB DERUXTECAN-NXKI CHEMO 100 MG IV SOLR
4.4000 mg/kg | Freq: Once | INTRAVENOUS | Status: AC
  Administered 2023-04-30: 300 mg via INTRAVENOUS
  Filled 2023-04-30: qty 15

## 2023-04-30 MED ORDER — SODIUM CHLORIDE 0.9% FLUSH
10.0000 mL | Freq: Once | INTRAVENOUS | Status: AC
  Administered 2023-04-30: 10 mL

## 2023-04-30 MED ORDER — DEXTROSE 5 % IV SOLN: Freq: Once | INTRAVENOUS | Status: AC

## 2023-04-30 MED ORDER — DIPHENHYDRAMINE HCL 25 MG PO CAPS
50.0000 mg | ORAL_CAPSULE | Freq: Once | ORAL | Status: AC
  Administered 2023-04-30: 50 mg via ORAL
  Filled 2023-04-30: qty 2

## 2023-04-30 MED ORDER — HEPARIN SOD (PORK) LOCK FLUSH 100 UNIT/ML IV SOLN
500.0000 [IU] | Freq: Once | INTRAVENOUS | Status: AC | PRN
  Administered 2023-04-30: 500 [IU]

## 2023-04-30 MED ORDER — SODIUM CHLORIDE 0.9 % IV SOLN
150.0000 mg | Freq: Once | INTRAVENOUS | Status: AC
  Administered 2023-04-30: 150 mg via INTRAVENOUS
  Filled 2023-04-30: qty 150

## 2023-04-30 NOTE — Patient Instructions (Signed)
[facility]  Discharge Instructions: Thank you for choosing [facility] to provide your oncology and hematology care.   If you have a lab appointment with the Cancer Center, please go directly to the Cancer Center and check in at the registration area.   Wear comfortable clothing and clothing appropriate for easy access to any Portacath or PICC line.   We strive to give you quality time with your provider. You may need to reschedule your appointment if you arrive late (15 or more minutes).  Arriving late affects you and other patients whose appointments are after yours.  Also, if you miss three or more appointments without notifying the office, you may be dismissed from the clinic at the provider's discretion.      For prescription refill requests, have your pharmacy contact our office and allow 72 hours for refills to be completed.    Today you received the following chemotherapy and/or immunotherapy agents Enhertu.   To help prevent nausea and vomiting after your treatment, we encourage you to take your nausea medication as directed.  BELOW ARE SYMPTOMS THAT SHOULD BE REPORTED IMMEDIATELY: *FEVER GREATER THAN 100.4 F (38 C) OR HIGHER *CHILLS OR SWEATING *NAUSEA AND VOMITING THAT IS NOT CONTROLLED WITH YOUR NAUSEA MEDICATION *UNUSUAL SHORTNESS OF BREATH *UNUSUAL BRUISING OR BLEEDING *URINARY PROBLEMS (pain or burning when urinating, or frequent urination) *BOWEL PROBLEMS (unusual diarrhea, constipation, pain near the anus) TENDERNESS IN MOUTH AND THROAT WITH OR WITHOUT PRESENCE OF ULCERS (sore throat, sores in mouth, or a toothache) UNUSUAL RASH, SWELLING OR PAIN  UNUSUAL VAGINAL DISCHARGE OR ITCHING   Items with * indicate a potential emergency and should be followed up as soon as possible or go to the Emergency Department if any problems should occur.  Please show the CHEMOTHERAPY ALERT CARD or IMMUNOTHERAPY ALERT CARD at check-in  to the Emergency Department and triage nurse.  Should you have questions after your visit or need to cancel or reschedule your appointment, please contact [facility]  Dept: [phone]  and follow the prompts.  Office hours are 8:00 a.m. to 4:30 p.m. Monday - Friday. Please note that voicemails left after 4:00 p.m. may not be returned until the following business day.  We are closed weekends and major holidays. You have access to a nurse at all times for urgent questions. Please call the main number to the clinic Dept: [phone] and follow the prompts.   For any non-urgent questions, you may also contact your provider using MyChart. We now offer e-Visits for anyone 18 and older to request care online for non-urgent symptoms. For details visit [email or web address].   Also download the MyChart app! Go to the app store, search "MyChart", open the app, select [facility], and log in with your MyChart username and password.   

## 2023-04-30 NOTE — Progress Notes (Signed)
Per Lillard Anes, NP, okay to treat with Echo from 01/16/23

## 2023-05-06 ENCOUNTER — Other Ambulatory Visit: Payer: Self-pay | Admitting: Hematology and Oncology

## 2023-05-07 ENCOUNTER — Encounter: Payer: Self-pay | Admitting: Hematology and Oncology

## 2023-05-08 ENCOUNTER — Other Ambulatory Visit: Payer: Self-pay

## 2023-05-10 ENCOUNTER — Other Ambulatory Visit: Payer: Self-pay | Admitting: *Deleted

## 2023-05-12 ENCOUNTER — Other Ambulatory Visit: Payer: Self-pay

## 2023-05-13 ENCOUNTER — Encounter: Payer: Self-pay | Admitting: Hematology and Oncology

## 2023-05-13 MED ORDER — CIPROFLOXACIN HCL 500 MG PO TABS: 500.0000 mg | ORAL_TABLET | Freq: Two times a day (BID) | ORAL | 0 refills | Status: AC

## 2023-05-13 NOTE — Telephone Encounter (Signed)
Pt called to this RN to state onset 2 days ago with increasing symptoms now with stinging,burning with frequency.  She denies fever or chills.  Per MD review - recommended antibiotic. Verified pharmacy with pt and prescription sent.

## 2023-05-20 ENCOUNTER — Encounter: Payer: Self-pay | Admitting: Pulmonary Disease

## 2023-05-20 ENCOUNTER — Ambulatory Visit: Payer: Medicare HMO | Admitting: Pulmonary Disease

## 2023-05-20 VITALS — BP 134/80 | HR 93 | Ht 65.0 in | Wt 146.8 lb

## 2023-05-20 DIAGNOSIS — R0609 Other forms of dyspnea: Secondary | ICD-10-CM

## 2023-05-20 MED FILL — Fosaprepitant Dimeglumine For IV Infusion 150 MG (Base Eq): INTRAVENOUS | Qty: 5 | Status: AC

## 2023-05-20 MED FILL — Dexamethasone Sodium Phosphate Inj 100 MG/10ML: INTRAMUSCULAR | Qty: 1 | Status: AC

## 2023-05-20 NOTE — Progress Notes (Signed)
@Patient  ID: Theresa [name], female    DOB: 1955-09-27, 67 y.o.   MRN: [id number]  Chief Complaint  Patient presents with   Follow-up    Pt is here for Pulmonary HTN and Respiratory Failure F/U visit.     Referring provider: Carylon Perches, MD  HPI:   67 y.o. woman with metastatic breast cancer to liver and bone whom we are seeing for evaluation of hypoxemic respiratory failure and presumed pulmonary hypertension.  Most recent pulmonary note Buelah Manis, NP reviewed.  Multiple notes from hospitalization 12/2022 reviewed.  Most recent oncology note x 2 reviewed.  Patient was hospitalized 12/2022.  With hypoxemia.  CT of the chest without PE but did demonstrate scattered patchy groundglass opacities concerning for multifocal pneumonia.  Placed on antibiotics.  Eventually discharged on oxygen.  Echocardiogram at that time demonstrated concern for elevated right-sided pressures with preserved RV function.  RA size within normal limits.  Minimal valve disease and normal EF.  Every time imaging is demonstrated ascites.  She underwent paracentesis 03/2023.  Cytology negative.  Unfortunate chemistry sent to evaluate SAAG, etiology of ascites.  Over the last few months he is gradually improved.  Was on oxygen.  Hypoxemia improved no longer using.  Back to riding horses.  More active.  Overall dyspnea much improved.  Imaging does demonstrate improved liver findings.  We discussed at length that-the treatment of cancer that the inflammatory milieu and effects of liver disease on pulmonary hypertension etc. have improved with cancer treatment.  There is a chance that there is underlying cirrhosis for other reasons and this can lead to portal hypertension and portal pulmonary hypertension.  Given how much she is improved I suspect echocardiogram will also look improved.  But we need to prepared to switch gears and treat differently if needed based on results.  Discussed role and rationale for repeat echocardiogram  now versus the next couple months.  After shared decision making agreed to pursue echocardiogram now.   Questionaires / Pulmonary Flowsheets:   ACT:      No data to display          MMRC:     No data to display          Epworth:      No data to display          Tests:   FENO:  No results found for: "NITRICOXIDE"  PFT:     No data to display          WALK:     02/11/2023   12:10 PM  SIX MIN WALK  Supplimental Oxygen during Test? (L/min) No  Tech Comments: Walked 1/2 lap  on RA desat. to 88%, placed on POC sat. 91% 2L, HR 105. No complaints.    Imaging: Personally reviewed and as per EMR and discussion in this note No results found.  Lab Results: Personally reviewed CBC    Component Value Date/Time   WBC 2.9 (L) 04/30/2023 0938   WBC 4.0 01/21/2023 0625   RBC 2.73 (L) 04/30/2023 0938   HGB 10.5 (L) 04/30/2023 0938   HGB 10.8 (L) 01/19/2020 0911   HGB 12.8 03/15/2013 1434   HCT 30.0 (L) 04/30/2023 0938   HCT 32.8 (L) 01/19/2020 0911   PLT 118 (L) 04/30/2023 0938   PLT 216 01/19/2020 0911   MCV 109.9 (H) 04/30/2023 0938   MCV 94 01/19/2020 0911   MCH 38.5 (H) 04/30/2023 0938   MCHC 35.0 04/30/2023 1027  RDW 17.2 (H) 04/30/2023 0938   RDW 12.5 01/19/2020 0911   LYMPHSABS 0.5 (L) 04/30/2023 0938   LYMPHSABS 0.8 01/19/2020 0911   MONOABS 0.3 04/30/2023 0938   EOSABS 0.1 04/30/2023 0938   EOSABS 0.2 01/19/2020 0911   BASOSABS 0.0 04/30/2023 0938   BASOSABS 0.0 01/19/2020 0911    BMET    Component Value Date/Time   NA 137 04/30/2023 0938   K 4.4 04/30/2023 0938   CL 106 04/30/2023 0938   CO2 28 04/30/2023 0938   GLUCOSE 107 (H) 04/30/2023 0938   BUN 7 (L) 04/30/2023 0938   CREATININE 0.54 04/30/2023 0938   CALCIUM 8.6 (L) 04/30/2023 0938   GFRNONAA >60 04/30/2023 0938   GFRAA >60 03/08/2020 1001    BNP    Component Value Date/Time   BNP 446.5 (H) 01/15/2023 0554    ProBNP    Component Value Date/Time   PROBNP 52.0  02/11/2023 1212    Specialty Problems       Pulmonary Problems   Hypoxic respiratory failure (HCC)   PNA (pneumonia)   Pneumonia    Allergies  Allergen Reactions   Metronidazole Other (See Comments)    C-Diff C-Diff C-Diff   Other Other (See Comments) and Rash    Dermabond - redness and burning, blistering Dermabond - redness and burning, blistering Dermabond - redness and burning, blistering "burning" skin   Tape Rash    "burning" skin "burning" skin    Immunization History  Administered Date(s) Administered   Influenza, Quadrivalent, Recombinant, Inj, Pf 06/23/2019   Moderna Sars-Covid-2 Vaccination 09/12/2020    Past Medical History:  Diagnosis Date   Breast cancer, right (HCC)    Contact lens/glasses fitting    wears contacts or glasses   Diarrhea    --post gallbladder surgery   Family history of bone cancer    Family history of brain cancer    Family history of stomach cancer    Family history of uterine cancer    Fibroids    History of radiation therapy    Hypertension    No pertinent past medical history    Personal history of radiation therapy    PONV (postoperative nausea and vomiting)    Right ovarian cyst    Vertigo     Tobacco History: Social History   Tobacco Use  Smoking Status Former   Current packs/day: 0.00   Types: Cigarettes   Quit date: 03/15/1996   Years since quitting: 27.1  Smokeless Tobacco Never   Counseling given: Not Answered   Continue to not smoke  Outpatient Encounter Medications as of 05/20/2023  Medication Sig   ciprofloxacin (CIPRO) 500 MG tablet Take 1 tablet (500 mg total) by mouth 2 (two) times daily for 7 days.   dexamethasone (DECADRON) 4 MG tablet Take 1 tablet (4 mg total) by mouth daily. Take 1 tablet day chemotherapy 1 tablet 2 days after chemo with food   docusate sodium (COLACE) 100 MG capsule Take 1 capsule (100 mg total) by mouth daily.   furosemide (LASIX) 20 MG tablet TAKE 1 TABLET BY MOUTH DAILY  WITH BREAKFAST   LORazepam (ATIVAN) 0.5 MG tablet TAKE 1 TABLET (0.5 MG TOTAL) BY MOUTH EVERY 8 (EIGHT) HOURS AS NEEDED. FOR ANXIETY   omeprazole (PRILOSEC) 20 MG capsule TAKE 1 CAPSULE BY MOUTH 2 (TWO) TIMES DAILY BEFORE A MEAL.   ondansetron (ZOFRAN) 8 MG tablet Take 1 tablet (8 mg total) by mouth every 8 (eight) hours as needed for nausea or  vomiting.   spironolactone (ALDACTONE) 25 MG tablet TAKE 1 TABLET (25 MG TOTAL) BY MOUTH DAILY.   venlafaxine XR (EFFEXOR-XR) 37.5 MG 24 hr capsule Take 1 capsule (37.5 mg total) by mouth daily.   No facility-administered encounter medications on file as of 05/20/2023.     Review of Systems  Review of Systems  No chest pain with exertion.  No orthopnea or PND.  Comprehensive review of systems otherwise negative. Physical Exam  BP 134/80 (BP Location: Left Arm, Cuff Size: Normal)   Pulse 93   Ht 5\' 5"  (1.651 m)   Wt 146 lb 12.8 oz (66.6 kg)   LMP 09/01/2007 (Exact Date)   SpO2 99%   BMI 24.43 kg/m   Wt Readings from Last 5 Encounters:  05/20/23 146 lb 12.8 oz (66.6 kg)  04/30/23 143 lb 3 oz (64.9 kg)  04/09/23 140 lb (63.5 kg)  03/19/23 146 lb (66.2 kg)  02/26/23 146 lb (66.2 kg)    BMI Readings from Last 5 Encounters:  05/20/23 24.43 kg/m  04/30/23 23.11 kg/m  04/09/23 22.60 kg/m  03/19/23 23.57 kg/m  02/26/23 23.57 kg/m     Physical Exam General: Sitting in chair, no acute distress Eyes: EOMI, no icterus Neck: Supple, no JVP Pulmonary: Clear, normal work of breathing Cardiovascular: Warm, no edema Abdomen: Nondistended, vessels present MSK: No synovitis, no joint effusion, right-sided chest port well-seated Neuro: Normal gait, no weakness Psych: Normal mood, full affect  Assessment & Plan:   Acute hypoxemic respiratory failure: Now resolved.  Saturating high 90s on room air.  Poss related to acute portal hypertension and portal pulmonary hypertension in the setting of significant liver metastasis.  Also possibly  slowly improved pneumonia from 01/18/2023 although chest images are relatively unimpressive.  Advised to continue checking oxygen but if staying in adequate range, above 88% with rest and exertion, no need to resume oxygen.  Presumed pulmonary hypertension: Based on TTE 12/2022.  Acutely ill with pneumonia and hypoxemic.  Hypoxemia is proved over time.  Ascites has been sampled in the interim but unfortunately no albumin studies to differentiate between portal hypertension or not.  Symptoms overall improved with treatment of cancer, hopefully will see improvement on echocardiogram as well if related to liver inflammation etc.  Repeat echocardiogram ordered.   Return in about 2 months (around 07/20/2023).   Karren Burly, MD 05/20/2023   This appointment required 42 minutes of patient care (this includes precharting, chart review, review of results, face-to-face care, etc.).

## 2023-05-20 NOTE — Patient Instructions (Signed)
Nice to meet you  I am glad you are doing better  No changes to medication  We will repeat a heart ultrasound (echocardiogram) and compared to when you are ill in the hospital.  My hope is that you are feeling better and the oxygen is doing better and the scans are looking better that the echocardiogram will show similar improvement.  Return to clinic in 2 months or sooner as needed with Dr. Judeth Horn

## 2023-05-21 ENCOUNTER — Inpatient Hospital Stay: Payer: Medicare HMO | Attending: Hematology and Oncology

## 2023-05-21 ENCOUNTER — Inpatient Hospital Stay: Payer: Medicare HMO

## 2023-05-21 ENCOUNTER — Inpatient Hospital Stay (HOSPITAL_BASED_OUTPATIENT_CLINIC_OR_DEPARTMENT_OTHER): Payer: Medicare HMO | Admitting: Hematology and Oncology

## 2023-05-21 VITALS — BP 126/67 | HR 95 | Temp 97.5°F | Resp 17 | Ht 65.0 in | Wt 145.8 lb

## 2023-05-21 DIAGNOSIS — C7951 Secondary malignant neoplasm of bone: Secondary | ICD-10-CM

## 2023-05-21 DIAGNOSIS — Z170 Estrogen receptor positive status [ER+]: Secondary | ICD-10-CM | POA: Insufficient documentation

## 2023-05-21 DIAGNOSIS — Z5112 Encounter for antineoplastic immunotherapy: Secondary | ICD-10-CM | POA: Diagnosis present

## 2023-05-21 DIAGNOSIS — Z923 Personal history of irradiation: Secondary | ICD-10-CM | POA: Insufficient documentation

## 2023-05-21 DIAGNOSIS — C419 Malignant neoplasm of bone and articular cartilage, unspecified: Secondary | ICD-10-CM

## 2023-05-21 DIAGNOSIS — C50311 Malignant neoplasm of lower-inner quadrant of right female breast: Secondary | ICD-10-CM

## 2023-05-21 DIAGNOSIS — C787 Secondary malignant neoplasm of liver and intrahepatic bile duct: Secondary | ICD-10-CM | POA: Insufficient documentation

## 2023-05-21 LAB — CBC WITH DIFFERENTIAL (CANCER CENTER ONLY)
Abs Immature Granulocytes: 0 10*3/uL (ref 0.00–0.07)
Basophils Absolute: 0 10*3/uL (ref 0.0–0.1)
Basophils Relative: 1 %
Eosinophils Absolute: 0.1 10*3/uL (ref 0.0–0.5)
Eosinophils Relative: 4 %
HCT: 32.6 % — ABNORMAL LOW (ref 36.0–46.0)
Hemoglobin: 10.9 g/dL — ABNORMAL LOW (ref 12.0–15.0)
Immature Granulocytes: 0 %
Lymphocytes Relative: 20 %
Lymphs Abs: 0.7 10*3/uL (ref 0.7–4.0)
MCH: 36.9 pg — ABNORMAL HIGH (ref 26.0–34.0)
MCHC: 33.4 g/dL (ref 30.0–36.0)
MCV: 110.5 fL — ABNORMAL HIGH (ref 80.0–100.0)
Monocytes Absolute: 0.5 10*3/uL (ref 0.1–1.0)
Monocytes Relative: 14 %
Neutro Abs: 2.2 10*3/uL (ref 1.7–7.7)
Neutrophils Relative %: 61 %
Platelet Count: 118 10*3/uL — ABNORMAL LOW (ref 150–400)
RBC: 2.95 MIL/uL — ABNORMAL LOW (ref 3.87–5.11)
RDW: 17.2 % — ABNORMAL HIGH (ref 11.5–15.5)
WBC Count: 3.4 10*3/uL — ABNORMAL LOW (ref 4.0–10.5)
nRBC: 0 % (ref 0.0–0.2)

## 2023-05-21 LAB — CMP (CANCER CENTER ONLY)
ALT: 21 U/L (ref 0–44)
AST: 43 U/L — ABNORMAL HIGH (ref 15–41)
Albumin: 3.4 g/dL — ABNORMAL LOW (ref 3.5–5.0)
Alkaline Phosphatase: 144 U/L — ABNORMAL HIGH (ref 38–126)
Anion gap: 5 (ref 5–15)
BUN: 8 mg/dL (ref 8–23)
CO2: 25 mmol/L (ref 22–32)
Calcium: 8.8 mg/dL — ABNORMAL LOW (ref 8.9–10.3)
Chloride: 107 mmol/L (ref 98–111)
Creatinine: 0.57 mg/dL (ref 0.44–1.00)
GFR, Estimated: >60 mL/min (ref >60)
Glucose, Bld: 167 mg/dL — ABNORMAL HIGH (ref 70–99)
Potassium: 4.1 mmol/L (ref 3.5–5.1)
Sodium: 137 mmol/L (ref 135–145)
Total Bilirubin: 1.5 mg/dL — ABNORMAL HIGH (ref 0.3–1.2)
Total Protein: 5.7 g/dL — ABNORMAL LOW (ref 6.5–8.1)

## 2023-05-21 MED ORDER — FAM-TRASTUZUMAB DERUXTECAN-NXKI CHEMO 100 MG IV SOLR
4.4000 mg/kg | Freq: Once | INTRAVENOUS | Status: AC
  Administered 2023-05-21: 300 mg via INTRAVENOUS
  Filled 2023-05-21: qty 15

## 2023-05-21 MED ORDER — DEXTROSE 5 % IV SOLN: Freq: Once | INTRAVENOUS | Status: AC

## 2023-05-21 MED ORDER — DIPHENHYDRAMINE HCL 25 MG PO CAPS
50.0000 mg | ORAL_CAPSULE | Freq: Once | ORAL | Status: AC
  Administered 2023-05-21: 50 mg via ORAL
  Filled 2023-05-21: qty 2

## 2023-05-21 MED ORDER — SODIUM CHLORIDE 0.9% FLUSH
10.0000 mL | INTRAVENOUS | Status: DC | PRN
  Administered 2023-05-21: 10 mL

## 2023-05-21 MED ORDER — SODIUM CHLORIDE 0.9% FLUSH
10.0000 mL | Freq: Once | INTRAVENOUS | Status: AC
  Administered 2023-05-21: 10 mL

## 2023-05-21 MED ORDER — ACETAMINOPHEN 325 MG PO TABS
650.0000 mg | ORAL_TABLET | Freq: Once | ORAL | Status: AC
  Administered 2023-05-21: 650 mg via ORAL
  Filled 2023-05-21: qty 2

## 2023-05-21 MED ORDER — SODIUM CHLORIDE 0.9 % IV SOLN
10.0000 mg | Freq: Once | INTRAVENOUS | Status: AC
  Administered 2023-05-21: 10 mg via INTRAVENOUS
  Filled 2023-05-21: qty 10

## 2023-05-21 MED ORDER — HEPARIN SOD (PORK) LOCK FLUSH 100 UNIT/ML IV SOLN
500.0000 [IU] | Freq: Once | INTRAVENOUS | Status: AC | PRN
  Administered 2023-05-21: 500 [IU]

## 2023-05-21 MED ORDER — PALONOSETRON HCL INJECTION 0.25 MG/5ML
0.2500 mg | Freq: Once | INTRAVENOUS | Status: AC
  Administered 2023-05-21: 0.25 mg via INTRAVENOUS
  Filled 2023-05-21: qty 5

## 2023-05-21 MED ORDER — SODIUM CHLORIDE 0.9 % IV SOLN
150.0000 mg | Freq: Once | INTRAVENOUS | Status: AC
  Administered 2023-05-21: 150 mg via INTRAVENOUS
  Filled 2023-05-21: qty 150

## 2023-05-21 NOTE — Assessment & Plan Note (Signed)
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  Patchy tree-in-bud nodularity in the lungs, partially necrotic mass right hepatic lobe 12 cm, also 2.5 cm lesion lytic bone metastases in spine pelvis and left scapula   Treatment plan: Liver biopsy: Poorly diff cancer ER 30-40%, PR:0%, Her 2 Neg Verzinio with AI started 10/24/2022 discontinued May 2024 due to progression in the liver 3.  Bone Marrow Biopsy: 10/13/22: Positive for met breast cancer:   4.  Hospitalization 01/11/2023-01/21/2023: Severe jaundice due to progression in the liver 5.  Perative radiation to the spine  -------------------------------------------------------------------------------------------------------------------- Current treatment: Enhertu cycle 6 (started 01/15/2023) Enhertu toxicities: None   Lab review:  01/26/2023: Hemoglobin 9.3, platelets 35, AST 171, ALT 60, alkaline phosphatase 398, total bilirubin 2.8 (improved from 5) 02/05/2023: Hemoglobin 9.8, platelets 52, AST 134, ALT 38, bilirubin 2.5, alkaline phosphatase 358, albumin 3.1 04/09/2023: Hemoglobin 10.6, platelets 127, bilirubin 1.5, AST 43   Appetite and energy levels are slowly improving with time. Ascites: S/P paracentesis 03/02/23 Insomnia issues: Takes lorazepam Constipation: Stool softener MiraLAX   CT CAP 03/11/2023: Significant interval decrease in size of bulky liver masses, consistent with  treatment response of hepatic metastatic disease (12.2 cm to 9.1 cm, 4.7 cm is now 2.9 cm), mod ascites, bone mets unchanged   She's now able to walk without a wheelchair and has not needed oxygen. Also rode her horse.   PET CT scan in October   Return to clinic in 3 weeks for cycle 7

## 2023-05-21 NOTE — Progress Notes (Signed)
Patient Care Team: Carylon Perches, MD as PCP - General (Internal Medicine) Almond Lint, MD as Consulting Physician (General Surgery) Lonie Peak, MD as Attending Physician (Radiation Oncology) Patton Salles, MD as Consulting Physician (Obstetrics and Gynecology) Venancio Poisson, MD as Consulting Physician (Dermatology) Crist Fat, MD as Consulting Physician (Urology) Charna Elizabeth, MD as Consulting Physician (Gastroenterology) Sheral Apley, MD as Attending Physician (Orthopedic Surgery) Serena Croissant, MD as Consulting Physician (Hematology and Oncology)  DIAGNOSIS:  Encounter Diagnoses  Name Primary?   Malignant neoplasm of lower-inner quadrant of right breast of female, estrogen receptor positive (HCC) Yes   Cancer, metastatic to bone (HCC)     SUMMARY OF ONCOLOGIC HISTORY: Oncology History  Malignant neoplasm of lower-inner quadrant of right breast of female, estrogen receptor positive (HCC)  07/03/2019 Initial Diagnosis   right lower inner quadrant biopsy, for a clinicaly multiple T1c N0, stage IA invasive ductal carcinoma, grade 2, E-cadherin positive, strongly estrogen receptor positive but progesterone receptor negative and HER-2 not amplified, with an MIB-1 of 5%             (a) breast MRI 08/07/2019 showed 2 additional suspicious areas in the right breast and 1 in the left breast              (b) biopsy of all 3 areas 08/21/2019 showed no malignancy   07/26/2019 -  Anti-estrogen oral therapy   tamoxifen started neoadjuvantly 07/26/2019; to be continued for 10 years.  S/p TAH/BSO on 01/16/2020 with benign pathology   08/04/2019 Genetic Testing   Negative genetic testing:  No pathogenic variants detected on the [facility] Breast Cancer STAT panel or the Common Hereditary Cancers panel. The report date is 08/04/2019.  The STAT Breast cancer panel offered by [facility] includes sequencing and rearrangement analysis for the following 9 genes:  ATM, BRCA1, BRCA2,  CDH1, CHEK2, PALB2, PTEN, STK11 and TP53.  The Common Hereditary Cancers Panel offered by [facility] includes sequencing and/or deletion duplication testing of the following 48 genes: APC, ATM, AXIN2, BARD1, BMPR1A, BRCA1, BRCA2, BRIP1, CDH1, CDK4, CDKN2A (p14ARF), CDKN2A (p16INK4a), CHEK2, CTNNA1, DICER1, EPCAM (Deletion/duplication testing only), GREM1 (promoter region deletion/duplication testing only), KIT, MEN1, MLH1, MSH2, MSH3, MSH6, MUTYH, NBN, NF1, NHTL1, PALB2, PDGFRA, PMS2, POLD1, POLE, PTEN, RAD50, RAD51C, RAD51D, RNF43, SDHB, SDHC, SDHD, SMAD4, SMARCA4. STK11, TP53, TSC1, TSC2, and VHL.  The following genes were evaluated for sequence changes only: SDHA and HOXB13 c.251G>A variant only.    10/04/2019 Cancer Staging   Staging form: Breast, AJCC 8th Edition - Pathologic stage from 10/04/2019: Stage IIB (pT2, pN1a(sn), cM0, G2, ER+, PR-, HER2-) - Signed by Loa Socks, NP on 10/18/2019   10/04/2019 Surgery    right lumpectomy and sentinel lymph node sampling 10/04/2019 showed a pT2 pN1, stage IIB invasive ductal carcinoma, grade 2, with positive lymphovascular invasion and a positive superior margin             (a) 3 sentinel lymph nodes removed, one with macro metastatic deposit, 1 with a micrometastatic deposit             (b) additional surgery for margin clearance   10/04/2019 Miscellaneous    MammaPrint on the 10/04/2019 sample shows a low risk luminal A tumor predicting a 5-year metastasis free survival of 96% without chemotherapy, and a chemotherapy benefit of less than 1.5%    11/27/2019 - 01/05/2020 Radiation Therapy    Site Technique Total Dose (Gy) Dose per Fx (Gy) Completed Fx Beam Energies  Breast, Right: Breast_Rt 3D 50/50 2 25/25 6X, 10X  Breast, Right: Breast_Rt_SCV_PAB 3D 50/50 2 25/25 6X, 10X  Breast, Right: Breast_Rt_Bst 3D 10/10 2 5/5 6X, 10X     09/29/2022 Imaging   IMPRESSION: 1. Surgical changes involving the right medial breast. No obvious recurrent breast mass  or chest wall mass. 2. Mediastinal and hilar lymphadenopathy suggesting metastatic disease. 3. Patchy tree-in-bud type nodularity in the lungs suggesting chronic inflammation or atypical infection such as MAC. No definite pulmonary metastatic nodules. 4. Large partially necrotic mass involving most of the right hepatic lobe and a 2.5 cm lesion in segment 3 of the liver consistent with metastatic disease. 5. 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   10/06/2022 Initial Biopsy   Liver biopsy: + for malignancy, metastatic poorly differentiated carcinoma, likely breast origin, biomarkers pending, insufficient tissue for molecular testing. (Resulted in EPIC on 10/13/2022)   10/07/2022 PET scan   PET scan on 10/07/2022 that showed hypermetabolic metastatic breast cancer with hypermetabolic mediastinal and hilar adenopathy, hypermetabolic hepatic metastatic disease and bone disease.  There were no findings for pulmonary metastatic disease.   10/21/2022 -  Anti-estrogen oral therapy   Verzinio and Letrozole   01/15/2023 -  Chemotherapy   Patient is on Treatment Plan : BREAST METASTATIC Fam-Trastuzumab Deruxtecan-nxki (Enhertu) (5.4) q21d     02/19/2023 Cancer Staging   Staging form: Breast, AJCC 8th Edition - Pathologic: Stage IV (pM1) - Signed by Loa Socks, NP on 02/19/2023   Cancer, metastatic to bone (HCC)  12/21/2022 Initial Diagnosis   Cancer, metastatic to bone (HCC)   01/15/2023 -  Chemotherapy   Patient is on Treatment Plan : BREAST METASTATIC Fam-Trastuzumab Deruxtecan-nxki (Enhertu) (5.4) q21d       CHIEF COMPLIANT: Cycle 6 Enhertu  Discussed the use of AI scribe software for clinical note transcription with the patient, who gave verbal consent to proceed.  History of Present Illness   The patient, with a history of metastatic breast cancer, reports fatigue that worsens by midday. She also experiences bloating and occasional  abdominal pain, which she hopes is a sign of the cancer dying. She has stiffness and soreness in her torso, and occasional pain in her back. Despite these symptoms, the patient remains active, even riding horses. She has seen a pulmonary doctor recently and has not needed to use oxygen. The patient's lab results show improvement in white blood cell count and hemoglobin, indicating healing in the bone marrow. The patient requests a refill of Lorazepam for sleep.         ALLERGIES:  is allergic to metronidazole, other, and tape.  MEDICATIONS:  Current Outpatient Medications  Medication Sig Dispense Refill   dexamethasone (DECADRON) 4 MG tablet Take 1 tablet (4 mg total) by mouth daily. Take 1 tablet day chemotherapy 1 tablet 2 days after chemo with food 30 tablet 0   docusate sodium (COLACE) 100 MG capsule Take 1 capsule (100 mg total) by mouth daily. 30 capsule 6   furosemide (LASIX) 20 MG tablet TAKE 1 TABLET BY MOUTH DAILY WITH BREAKFAST 90 tablet 1   LORazepam (ATIVAN) 0.5 MG tablet TAKE 1 TABLET (0.5 MG TOTAL) BY MOUTH EVERY 8 (EIGHT) HOURS AS NEEDED. FOR ANXIETY 15 tablet 3   omeprazole (PRILOSEC) 20 MG capsule TAKE 1 CAPSULE BY MOUTH 2 (TWO) TIMES DAILY BEFORE A MEAL. 180 capsule 3   ondansetron (ZOFRAN) 8 MG tablet Take 1 tablet (8 mg total) by mouth every 8 (  eight) hours as needed for nausea or vomiting. 30 tablet 2   spironolactone (ALDACTONE) 25 MG tablet TAKE 1 TABLET (25 MG TOTAL) BY MOUTH DAILY. 90 tablet 1   venlafaxine XR (EFFEXOR-XR) 37.5 MG 24 hr capsule Take 1 capsule (37.5 mg total) by mouth daily. 90 capsule 4   No current facility-administered medications for this visit.    PHYSICAL EXAMINATION: ECOG PERFORMANCE STATUS: 1 - Symptomatic but completely ambulatory  Vitals:   05/21/23 0910  BP: 126/67  Pulse: 95  Resp: 17  Temp: (!) 97.5 F (36.4 C)  SpO2: 100%   Filed Weights   05/21/23 0910  Weight: 145 lb 12.8 oz (66.1 kg)    Physical Exam   VITALS: SaO2-  100     LABORATORY DATA:  I have reviewed the data as listed    Latest Ref Rng & Units 05/21/2023    8:44 AM 04/30/2023    9:38 AM 04/09/2023    9:40 AM  CMP  Glucose 70 - 99 mg/dL 119  147  829   BUN 8 - 23 mg/dL 8  7  8    Creatinine 0.44 - 1.00 mg/dL 5.62  1.30  8.65   Sodium 135 - 145 mmol/L 137  137  135   Potassium 3.5 - 5.1 mmol/L 4.1  4.4  4.6   Chloride 98 - 111 mmol/L 107  106  105   CO2 22 - 32 mmol/L 25  28  25    Calcium 8.9 - 10.3 mg/dL 8.8  8.6  8.5   Total Protein 6.5 - 8.1 g/dL 5.7  5.7  5.6   Total Bilirubin 0.3 - 1.2 mg/dL 1.5  1.3  1.5   Alkaline Phos 38 - 126 U/L 144  161  216   AST 15 - 41 U/L 43  38  43   ALT 0 - 44 U/L 21  16  18      Lab Results  Component Value Date   WBC 3.4 (L) 05/21/2023   HGB 10.9 (L) 05/21/2023   HCT 32.6 (L) 05/21/2023   MCV 110.5 (H) 05/21/2023   PLT 118 (L) 05/21/2023   NEUTROABS 2.2 05/21/2023    ASSESSMENT & PLAN:  Malignant neoplasm of lower-inner quadrant of right breast of female, estrogen receptor positive (HCC) 07/03/2019: Right breast LIQ T1CN0 stage Ia grade 2 IDC ER 95% PR negative HER2 negative Ki-67 5% 10/04/2019: Right lumpectomy: T2N1 stage IIb grade 2 IDC positive lymphovascular invasion 3 lymph nodes removed 1 with macro N1 micrometastatic disease 10/04/2019: MammaPrint: Low risk 01/05/2020: Completed adjuvant radiation 08/04/2019: Genetics: Negative November 2020: Tamoxifen ------------------------------------------------------------------- 09/28/2022: Patient presented with platelets 65 INR 1.9 elevated LFTs and hypercalcemia  CT CAP 09/29/2022: Mediastinal and hilar lymphadenopathy suggestive of metastatic disease.  Patchy tree-in-bud nodularity in the lungs, partially necrotic mass right hepatic lobe 12 cm, also 2.5 cm lesion lytic bone metastases in spine pelvis and left scapula   Treatment plan: Liver biopsy: Poorly diff cancer ER 30-40%, PR:0%, Her 2 Neg Verzinio with AI started 10/24/2022 discontinued May 2024  due to progression in the liver 3.  Bone Marrow Biopsy: 10/13/22: Positive for met breast cancer:   4.  Hospitalization 01/11/2023-01/21/2023: Severe jaundice due to progression in the liver 5.  Perative radiation to the spine  -------------------------------------------------------------------------------------------------------------------- Current treatment: Enhertu cycle 6 (started 01/15/2023) Enhertu toxicities: None   Lab review:  01/26/2023: Hemoglobin 9.3, platelets 35, AST 171, ALT 60, alkaline phosphatase 398, total bilirubin 2.8 (improved from 5) 02/05/2023: Hemoglobin 9.8,  platelets 52, AST 134, ALT 38, bilirubin 2.5, alkaline phosphatase 358, albumin 3.1 04/09/2023: Hemoglobin 10.6, platelets 127, bilirubin 1.5, AST 43   Appetite and energy levels are slowly improving with time. Ascites: S/P paracentesis 03/02/23 Insomnia issues: Takes lorazepam Constipation: Stool softener MiraLAX   CT CAP 03/11/2023: Significant interval decrease in size of bulky liver masses, consistent with treatment response of hepatic metastatic disease (12.2 cm to 9.1 cm, 4.7 cm is now 2.9 cm), mod ascites, bone mets unchanged   She's now able to walk without a wheelchair and has not needed oxygen. Also rode her horse.   PET CT scan in October   Return to clinic in 3 weeks for cycle 7 ------------------------------------- Assessment and Plan    Metastatic Breast Cancer Patient reports intermittent abdominal discomfort. Scans in July showed good response to treatment. Next PET scan scheduled for October 7th to assess current status of disease. -Continue current treatment regimen. -Perform PET scan on October 7th to assess disease status.  Fatigue Patient reports feeling fatigued, particularly in the afternoons. This may be related to ongoing cancer treatment. -Continue to monitor and manage as needed.  Back Pain Patient reports stiffness and soreness in the back, likely related to previous spinal  procedures (cement in T9 and radiation to T10). Patient is being mindful of physical activity to avoid exacerbating pain. -Continue current pain management strategies. -Advise patient to continue being mindful of physical activity.  Insomnia Patient reports occasional difficulty sleeping. Lorazepam has been effective when used sparingly. -Continue as needed use of Lorazepam for insomnia.  General Health Maintenance -Continue monitoring blood counts and liver function. Current trends are positive with improving white blood cell count, hemoglobin, and liver function. -Continue to encourage good nutrition to support overall health and treatment response.          Orders Placed This Encounter  Procedures   NM PET Image Restag (PS) Skull Base To Thigh    Standing Status:   Future    Standing Expiration Date:   05/20/2024    Order Specific Question:   If indicated for the ordered procedure, I authorize the administration of a radiopharmaceutical per Radiology protocol    Answer:   Yes    Order Specific Question:   Preferred imaging location?    Answer:   [facility]   The patient has a good understanding of the overall plan. she agrees with it. she will call with any problems that may develop before the next visit here. Total time spent: 30 mins including face to face time and time spent for planning, charting and co-ordination of care   Tamsen Meek, MD 05/21/23

## 2023-06-01 ENCOUNTER — Other Ambulatory Visit: Payer: Self-pay

## 2023-06-07 ENCOUNTER — Ambulatory Visit (HOSPITAL_COMMUNITY)
Admission: RE | Admit: 2023-06-07 | Discharge: 2023-06-07 | Disposition: A | Discharge status: Home / Self Care | Payer: Medicare HMO | Source: Ambulatory Visit | Attending: Hematology and Oncology | Admitting: Hematology and Oncology

## 2023-06-07 DIAGNOSIS — C50311 Malignant neoplasm of lower-inner quadrant of right female breast: Secondary | ICD-10-CM | POA: Diagnosis present

## 2023-06-07 DIAGNOSIS — C7951 Secondary malignant neoplasm of bone: Secondary | ICD-10-CM | POA: Insufficient documentation

## 2023-06-07 DIAGNOSIS — Z170 Estrogen receptor positive status [ER+]: Secondary | ICD-10-CM | POA: Diagnosis present

## 2023-06-07 LAB — GLUCOSE, CAPILLARY: Glucose-Capillary: 117 mg/dL — ABNORMAL HIGH (ref 70–99)

## 2023-06-07 MED ORDER — FLUDEOXYGLUCOSE F - 18 (FDG) INJECTION
7.3000 | Freq: Once | INTRAVENOUS | Status: AC
  Administered 2023-06-07: 7.3 via INTRAVENOUS

## 2023-06-08 ENCOUNTER — Ambulatory Visit (HOSPITAL_COMMUNITY): Payer: Medicare HMO | Attending: Pulmonary Disease

## 2023-06-08 DIAGNOSIS — R0609 Other forms of dyspnea: Secondary | ICD-10-CM | POA: Insufficient documentation

## 2023-06-08 DIAGNOSIS — I272 Pulmonary hypertension, unspecified: Secondary | ICD-10-CM

## 2023-06-08 LAB — ECHOCARDIOGRAM COMPLETE
Area-P 1/2: 2.8 cm2
S' Lateral: 3.1 cm

## 2023-06-10 MED FILL — Fosaprepitant Dimeglumine For IV Infusion 150 MG (Base Eq): INTRAVENOUS | Qty: 5 | Status: AC

## 2023-06-10 MED FILL — Dexamethasone Sodium Phosphate Inj 100 MG/10ML: INTRAMUSCULAR | Qty: 1 | Status: AC

## 2023-06-11 ENCOUNTER — Inpatient Hospital Stay: Payer: Medicare HMO | Attending: Hematology and Oncology

## 2023-06-11 ENCOUNTER — Inpatient Hospital Stay: Payer: Medicare HMO | Admitting: Hematology and Oncology

## 2023-06-11 ENCOUNTER — Inpatient Hospital Stay: Payer: Medicare HMO

## 2023-06-11 VITALS — BP 123/67 | HR 88 | Temp 97.2°F | Resp 18 | Ht 65.0 in | Wt 151.4 lb

## 2023-06-11 VITALS — BP 127/82 | HR 89 | Resp 18

## 2023-06-11 DIAGNOSIS — C7951 Secondary malignant neoplasm of bone: Secondary | ICD-10-CM

## 2023-06-11 DIAGNOSIS — C50311 Malignant neoplasm of lower-inner quadrant of right female breast: Secondary | ICD-10-CM | POA: Insufficient documentation

## 2023-06-11 DIAGNOSIS — Z7952 Long term (current) use of systemic steroids: Secondary | ICD-10-CM | POA: Diagnosis not present

## 2023-06-11 DIAGNOSIS — Z923 Personal history of irradiation: Secondary | ICD-10-CM | POA: Insufficient documentation

## 2023-06-11 DIAGNOSIS — C787 Secondary malignant neoplasm of liver and intrahepatic bile duct: Secondary | ICD-10-CM | POA: Insufficient documentation

## 2023-06-11 DIAGNOSIS — Z170 Estrogen receptor positive status [ER+]: Secondary | ICD-10-CM | POA: Insufficient documentation

## 2023-06-11 DIAGNOSIS — Z79899 Other long term (current) drug therapy: Secondary | ICD-10-CM | POA: Diagnosis not present

## 2023-06-11 DIAGNOSIS — C419 Malignant neoplasm of bone and articular cartilage, unspecified: Secondary | ICD-10-CM

## 2023-06-11 DIAGNOSIS — Z5112 Encounter for antineoplastic immunotherapy: Secondary | ICD-10-CM | POA: Diagnosis present

## 2023-06-11 LAB — CBC WITH DIFFERENTIAL (CANCER CENTER ONLY)
Abs Immature Granulocytes: 0.01 10*3/uL (ref 0.00–0.07)
Basophils Absolute: 0 10*3/uL (ref 0.0–0.1)
Basophils Relative: 1 %
Eosinophils Absolute: 0.1 10*3/uL (ref 0.0–0.5)
Eosinophils Relative: 4 %
HCT: 30.8 % — ABNORMAL LOW (ref 36.0–46.0)
Hemoglobin: 10.5 g/dL — ABNORMAL LOW (ref 12.0–15.0)
Immature Granulocytes: 0 %
Lymphocytes Relative: 26 %
Lymphs Abs: 0.8 10*3/uL (ref 0.7–4.0)
MCH: 37.8 pg — ABNORMAL HIGH (ref 26.0–34.0)
MCHC: 34.1 g/dL (ref 30.0–36.0)
MCV: 110.8 fL — ABNORMAL HIGH (ref 80.0–100.0)
Monocytes Absolute: 0.5 10*3/uL (ref 0.1–1.0)
Monocytes Relative: 16 %
Neutro Abs: 1.7 10*3/uL (ref 1.7–7.7)
Neutrophils Relative %: 53 %
Platelet Count: 112 10*3/uL — ABNORMAL LOW (ref 150–400)
RBC: 2.78 MIL/uL — ABNORMAL LOW (ref 3.87–5.11)
RDW: 16.9 % — ABNORMAL HIGH (ref 11.5–15.5)
WBC Count: 3.1 10*3/uL — ABNORMAL LOW (ref 4.0–10.5)
nRBC: 0 % (ref 0.0–0.2)

## 2023-06-11 LAB — CMP (CANCER CENTER ONLY)
ALT: 17 U/L (ref 0–44)
AST: 36 U/L (ref 15–41)
Albumin: 3.4 g/dL — ABNORMAL LOW (ref 3.5–5.0)
Alkaline Phosphatase: 125 U/L (ref 38–126)
Anion gap: 5 (ref 5–15)
BUN: 10 mg/dL (ref 8–23)
CO2: 25 mmol/L (ref 22–32)
Calcium: 8.7 mg/dL — ABNORMAL LOW (ref 8.9–10.3)
Chloride: 109 mmol/L (ref 98–111)
Creatinine: 0.52 mg/dL (ref 0.44–1.00)
GFR, Estimated: >60 mL/min (ref >60)
Glucose, Bld: 186 mg/dL — ABNORMAL HIGH (ref 70–99)
Potassium: 3.7 mmol/L (ref 3.5–5.1)
Sodium: 139 mmol/L (ref 135–145)
Total Bilirubin: 1.5 mg/dL — ABNORMAL HIGH (ref 0.3–1.2)
Total Protein: 5.5 g/dL — ABNORMAL LOW (ref 6.5–8.1)

## 2023-06-11 MED ORDER — DEXTROSE 5 % IV SOLN: Freq: Once | INTRAVENOUS | Status: AC

## 2023-06-11 MED ORDER — FAM-TRASTUZUMAB DERUXTECAN-NXKI CHEMO 100 MG IV SOLR
4.4000 mg/kg | Freq: Once | INTRAVENOUS | Status: AC
  Administered 2023-06-11: 300 mg via INTRAVENOUS
  Filled 2023-06-11: qty 15

## 2023-06-11 MED ORDER — SODIUM CHLORIDE 0.9% FLUSH
10.0000 mL | Freq: Once | INTRAVENOUS | Status: AC
  Administered 2023-06-11: 10 mL

## 2023-06-11 MED ORDER — DENOSUMAB 120 MG/1.7ML ~~LOC~~ SOLN
120.0000 mg | Freq: Once | SUBCUTANEOUS | Status: AC
  Administered 2023-06-11: 120 mg via SUBCUTANEOUS
  Filled 2023-06-11: qty 1.7

## 2023-06-11 MED ORDER — HEPARIN SOD (PORK) LOCK FLUSH 100 UNIT/ML IV SOLN
500.0000 [IU] | Freq: Once | INTRAVENOUS | Status: AC | PRN
  Administered 2023-06-11: 500 [IU]

## 2023-06-11 MED ORDER — PALONOSETRON HCL INJECTION 0.25 MG/5ML
0.2500 mg | Freq: Once | INTRAVENOUS | Status: AC
  Administered 2023-06-11: 0.25 mg via INTRAVENOUS
  Filled 2023-06-11: qty 5

## 2023-06-11 MED ORDER — SODIUM CHLORIDE 0.9 % IV SOLN
10.0000 mg | Freq: Once | INTRAVENOUS | Status: AC
  Administered 2023-06-11: 10 mg via INTRAVENOUS
  Filled 2023-06-11: qty 10

## 2023-06-11 MED ORDER — DIPHENHYDRAMINE HCL 25 MG PO CAPS
50.0000 mg | ORAL_CAPSULE | Freq: Once | ORAL | Status: AC
  Administered 2023-06-11: 50 mg via ORAL
  Filled 2023-06-11: qty 2

## 2023-06-11 MED ORDER — SODIUM CHLORIDE 0.9% FLUSH
10.0000 mL | INTRAVENOUS | Status: DC | PRN
  Administered 2023-06-11: 10 mL

## 2023-06-11 MED ORDER — ACETAMINOPHEN 325 MG PO TABS
650.0000 mg | ORAL_TABLET | Freq: Once | ORAL | Status: AC
  Administered 2023-06-11: 650 mg via ORAL
  Filled 2023-06-11: qty 2

## 2023-06-11 MED ORDER — SODIUM CHLORIDE 0.9 % IV SOLN
150.0000 mg | Freq: Once | INTRAVENOUS | Status: AC
  Administered 2023-06-11: 150 mg via INTRAVENOUS
  Filled 2023-06-11: qty 150

## 2023-06-11 NOTE — Patient Instructions (Signed)
[facility]  Discharge Instructions: Thank you for choosing [facility] to provide your oncology and hematology care.   If you have a lab appointment with the Cancer Center, please go directly to the Cancer Center and check in at the registration area.   Wear comfortable clothing and clothing appropriate for easy access to any Portacath or PICC line.   We strive to give you quality time with your provider. You may need to reschedule your appointment if you arrive late (15 or more minutes).  Arriving late affects you and other patients whose appointments are after yours.  Also, if you miss three or more appointments without notifying the office, you may be dismissed from the clinic at the provider's discretion.      For prescription refill requests, have your pharmacy contact our office and allow 72 hours for refills to be completed.    Today you received the following chemotherapy and/or immunotherapy agents Enhertu.   To help prevent nausea and vomiting after your treatment, we encourage you to take your nausea medication as directed.  BELOW ARE SYMPTOMS THAT SHOULD BE REPORTED IMMEDIATELY: *FEVER GREATER THAN 100.4 F (38 C) OR HIGHER *CHILLS OR SWEATING *NAUSEA AND VOMITING THAT IS NOT CONTROLLED WITH YOUR NAUSEA MEDICATION *UNUSUAL SHORTNESS OF BREATH *UNUSUAL BRUISING OR BLEEDING *URINARY PROBLEMS (pain or burning when urinating, or frequent urination) *BOWEL PROBLEMS (unusual diarrhea, constipation, pain near the anus) TENDERNESS IN MOUTH AND THROAT WITH OR WITHOUT PRESENCE OF ULCERS (sore throat, sores in mouth, or a toothache) UNUSUAL RASH, SWELLING OR PAIN  UNUSUAL VAGINAL DISCHARGE OR ITCHING   Items with * indicate a potential emergency and should be followed up as soon as possible or go to the Emergency Department if any problems should occur.  Please show the CHEMOTHERAPY ALERT CARD or IMMUNOTHERAPY ALERT CARD at check-in  to the Emergency Department and triage nurse.  Should you have questions after your visit or need to cancel or reschedule your appointment, please contact [facility]  Dept: [phone]  and follow the prompts.  Office hours are 8:00 a.m. to 4:30 p.m. Monday - Friday. Please note that voicemails left after 4:00 p.m. may not be returned until the following business day.  We are closed weekends and major holidays. You have access to a nurse at all times for urgent questions. Please call the main number to the clinic Dept: [phone] and follow the prompts.   For any non-urgent questions, you may also contact your provider using MyChart. We now offer e-Visits for anyone 18 and older to request care online for non-urgent symptoms. For details visit [email or web address].   Also download the MyChart app! Go to the app store, search "MyChart", open the app, select [facility], and log in with your MyChart username and password.   

## 2023-06-11 NOTE — Progress Notes (Signed)
Patient Care Team: Carylon Perches, MD as PCP - General (Internal Medicine) Almond Lint, MD as Consulting Physician (General Surgery) Lonie Peak, MD as Attending Physician (Radiation Oncology) Patton Salles, MD as Consulting Physician (Obstetrics and Gynecology) Venancio Poisson, MD as Consulting Physician (Dermatology) Crist Fat, MD as Consulting Physician (Urology) Charna Elizabeth, MD as Consulting Physician (Gastroenterology) Sheral Apley, MD as Attending Physician (Orthopedic Surgery) Serena Croissant, MD as Consulting Physician (Hematology and Oncology)  DIAGNOSIS:  Encounter Diagnoses  Name Primary?   Malignant neoplasm of lower-inner quadrant of right breast of female, estrogen receptor positive (HCC) Yes   Cancer, metastatic to bone (HCC)     SUMMARY OF ONCOLOGIC HISTORY: Oncology History  Malignant neoplasm of lower-inner quadrant of right breast of female, estrogen receptor positive (HCC)  07/03/2019 Initial Diagnosis   right lower inner quadrant biopsy, for a clinicaly multiple T1c N0, stage IA invasive ductal carcinoma, grade 2, E-cadherin positive, strongly estrogen receptor positive but progesterone receptor negative and HER-2 not amplified, with an MIB-1 of 5%             (a) breast MRI 08/07/2019 showed 2 additional suspicious areas in the right breast and 1 in the left breast              (b) biopsy of all 3 areas 08/21/2019 showed no malignancy   07/26/2019 -  Anti-estrogen oral therapy   tamoxifen started neoadjuvantly 07/26/2019; to be continued for 10 years.  S/p TAH/BSO on 01/16/2020 with benign pathology   08/04/2019 Genetic Testing   Negative genetic testing:  No pathogenic variants detected on the [facility] Breast Cancer STAT panel or the Common Hereditary Cancers panel. The report date is 08/04/2019.  The STAT Breast cancer panel offered by [facility] includes sequencing and rearrangement analysis for the following 9 genes:  ATM, BRCA1, BRCA2,  CDH1, CHEK2, PALB2, PTEN, STK11 and TP53.  The Common Hereditary Cancers Panel offered by [facility] includes sequencing and/or deletion duplication testing of the following 48 genes: APC, ATM, AXIN2, BARD1, BMPR1A, BRCA1, BRCA2, BRIP1, CDH1, CDK4, CDKN2A (p14ARF), CDKN2A (p16INK4a), CHEK2, CTNNA1, DICER1, EPCAM (Deletion/duplication testing only), GREM1 (promoter region deletion/duplication testing only), KIT, MEN1, MLH1, MSH2, MSH3, MSH6, MUTYH, NBN, NF1, NHTL1, PALB2, PDGFRA, PMS2, POLD1, POLE, PTEN, RAD50, RAD51C, RAD51D, RNF43, SDHB, SDHC, SDHD, SMAD4, SMARCA4. STK11, TP53, TSC1, TSC2, and VHL.  The following genes were evaluated for sequence changes only: SDHA and HOXB13 c.251G>A variant only.    10/04/2019 Cancer Staging   Staging form: Breast, AJCC 8th Edition - Pathologic stage from 10/04/2019: Stage IIB (pT2, pN1a(sn), cM0, G2, ER+, PR-, HER2-) - Signed by Loa Socks, NP on 10/18/2019   10/04/2019 Surgery    right lumpectomy and sentinel lymph node sampling 10/04/2019 showed a pT2 pN1, stage IIB invasive ductal carcinoma, grade 2, with positive lymphovascular invasion and a positive superior margin             (a) 3 sentinel lymph nodes removed, one with macro metastatic deposit, 1 with a micrometastatic deposit             (b) additional surgery for margin clearance   10/04/2019 Miscellaneous    MammaPrint on the 10/04/2019 sample shows a low risk luminal A tumor predicting a 5-year metastasis free survival of 96% without chemotherapy, and a chemotherapy benefit of less than 1.5%    11/27/2019 - 01/05/2020 Radiation Therapy    Site Technique Total Dose (Gy) Dose per Fx (Gy) Completed Fx Beam Energies  Breast, Right: Breast_Rt 3D 50/50 2 25/25 6X, 10X  Breast, Right: Breast_Rt_SCV_PAB 3D 50/50 2 25/25 6X, 10X  Breast, Right: Breast_Rt_Bst 3D 10/10 2 5/5 6X, 10X     09/29/2022 Imaging   IMPRESSION: 1. Surgical changes involving the right medial breast. No obvious recurrent breast mass  or chest wall mass. 2. Mediastinal and hilar lymphadenopathy suggesting metastatic disease. 3. Patchy tree-in-bud type nodularity in the lungs suggesting chronic inflammation or atypical infection such as MAC. No definite pulmonary metastatic nodules. 4. Large partially necrotic mass involving most of the right hepatic lobe and a 2.5 cm lesion in segment 3 of the liver consistent with metastatic disease. 5. 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   10/06/2022 Initial Biopsy   Liver biopsy: + for malignancy, metastatic poorly differentiated carcinoma, likely breast origin, biomarkers pending, insufficient tissue for molecular testing. (Resulted in EPIC on 10/13/2022)   10/07/2022 PET scan   PET scan on 10/07/2022 that showed hypermetabolic metastatic breast cancer with hypermetabolic mediastinal and hilar adenopathy, hypermetabolic hepatic metastatic disease and bone disease.  There were no findings for pulmonary metastatic disease.   10/21/2022 -  Anti-estrogen oral therapy   Verzinio and Letrozole   01/15/2023 -  Chemotherapy   Patient is on Treatment Plan : BREAST METASTATIC Fam-Trastuzumab Deruxtecan-nxki (Enhertu) (5.4) q21d     02/19/2023 Cancer Staging   Staging form: Breast, AJCC 8th Edition - Pathologic: Stage IV (pM1) - Signed by Loa Socks, NP on 02/19/2023   Cancer, metastatic to bone (HCC)  12/21/2022 Initial Diagnosis   Cancer, metastatic to bone (HCC)   01/15/2023 -  Chemotherapy   Patient is on Treatment Plan : BREAST METASTATIC Fam-Trastuzumab Deruxtecan-nxki (Enhertu) (5.4) q21d       CHIEF COMPLIANT: Enhertu cycle 7 and follow-up of PET CT scan  History of Present Illness Ms. Yale 67 year old above-mentioned history metastatic breast cancer with extensive liver and bone metastases who is here to receive her cycle 7 of Enhertu.  She finished 6 cycles and underwent a PET CT scan which showed complete response with no  hypermetabolic activity in the liver and significantly improved bone metastases without much activity.  She is overall feeling quite well her major complaint is some slight back pain which she developed after she had to vacuum her entire house.     ALLERGIES:  is allergic to metronidazole, other, and tape.  MEDICATIONS:  Current Outpatient Medications  Medication Sig Dispense Refill   dexamethasone (DECADRON) 4 MG tablet Take 1 tablet (4 mg total) by mouth daily. Take 1 tablet day chemotherapy 1 tablet 2 days after chemo with food 30 tablet 0   docusate sodium (COLACE) 100 MG capsule Take 1 capsule (100 mg total) by mouth daily. 30 capsule 6   furosemide (LASIX) 20 MG tablet TAKE 1 TABLET BY MOUTH DAILY WITH BREAKFAST 90 tablet 1   LORazepam (ATIVAN) 0.5 MG tablet TAKE 1 TABLET (0.5 MG TOTAL) BY MOUTH EVERY 8 (EIGHT) HOURS AS NEEDED. FOR ANXIETY 15 tablet 3   omeprazole (PRILOSEC) 20 MG capsule TAKE 1 CAPSULE BY MOUTH 2 (TWO) TIMES DAILY BEFORE A MEAL. 180 capsule 3   ondansetron (ZOFRAN) 8 MG tablet Take 1 tablet (8 mg total) by mouth every 8 (eight) hours as needed for nausea or vomiting. 30 tablet 2   spironolactone (ALDACTONE) 25 MG tablet TAKE 1 TABLET (25 MG TOTAL) BY MOUTH DAILY. 90 tablet 1   venlafaxine XR (EFFEXOR-XR) 37.5 MG 24 hr capsule  Take 1 capsule (37.5 mg total) by mouth daily. 90 capsule 4   No current facility-administered medications for this visit.   Facility-Administered Medications Ordered in Other Visits  Medication Dose Route Frequency Provider Last Rate Last Admin   acetaminophen (TYLENOL) tablet 650 mg  650 mg Oral Once Serena Croissant, MD       denosumab (XGEVA) injection 120 mg  120 mg Subcutaneous Once Serena Croissant, MD       dexamethasone (DECADRON) 10 mg in sodium chloride 0.9 % 50 mL IVPB  10 mg Intravenous Once Serena Croissant, MD       diphenhydrAMINE (BENADRYL) capsule 50 mg  50 mg Oral Once Serena Croissant, MD       fam-trastuzumab deruxtecan-nxki (ENHERTU) 300  mg in dextrose 5 % 100 mL chemo infusion  4.4 mg/kg (Treatment Plan Recorded) Intravenous Once Serena Croissant, MD       fosaprepitant (EMEND) 150 mg in sodium chloride 0.9 % 145 mL IVPB  150 mg Intravenous Once Serena Croissant, MD       heparin lock flush 100 unit/mL  500 Units Intracatheter Once PRN Serena Croissant, MD       palonosetron (ALOXI) injection 0.25 mg  0.25 mg Intravenous Once Serena Croissant, MD       sodium chloride flush (NS) 0.9 % injection 10 mL  10 mL Intracatheter PRN Serena Croissant, MD        PHYSICAL EXAMINATION: ECOG PERFORMANCE STATUS: 1 - Symptomatic but completely ambulatory  Vitals:   06/11/23 0954  BP: 123/67  Pulse: 88  Resp: 18  Temp: (!) 97.2 F (36.2 C)  SpO2: 98%   Filed Weights   06/11/23 0954  Weight: 151 lb 6.4 oz (68.7 kg)     LABORATORY DATA:  I have reviewed the data as listed    Latest Ref Rng & Units 06/11/2023    9:31 AM 05/21/2023    8:44 AM 04/30/2023    9:38 AM  CMP  Glucose 70 - 99 mg/dL 409  811  914   BUN 8 - 23 mg/dL 10  8  7    Creatinine 0.44 - 1.00 mg/dL 7.82  9.56  2.13   Sodium 135 - 145 mmol/L 139  137  137   Potassium 3.5 - 5.1 mmol/L 3.7  4.1  4.4   Chloride 98 - 111 mmol/L 109  107  106   CO2 22 - 32 mmol/L 25  25  28    Calcium 8.9 - 10.3 mg/dL 8.7  8.8  8.6   Total Protein 6.5 - 8.1 g/dL 5.5  5.7  5.7   Total Bilirubin 0.3 - 1.2 mg/dL 1.5  1.5  1.3   Alkaline Phos 38 - 126 U/L 125  144  161   AST 15 - 41 U/L 36  43  38   ALT 0 - 44 U/L 17  21  16      Lab Results  Component Value Date   WBC 3.1 (L) 06/11/2023   HGB 10.5 (L) 06/11/2023   HCT 30.8 (L) 06/11/2023   MCV 110.8 (H) 06/11/2023   PLT 112 (L) 06/11/2023   NEUTROABS 1.7 06/11/2023    ASSESSMENT & PLAN:  Malignant neoplasm of lower-inner quadrant of right breast of female, estrogen receptor positive (HCC) 07/03/2019: Right breast LIQ T1CN0 stage Ia grade 2 IDC ER 95% PR negative HER2 negative Ki-67 5% 10/04/2019: Right lumpectomy: T2N1 stage IIb grade 2 IDC  positive lymphovascular invasion 3 lymph nodes removed 1 with macro  N1 micrometastatic disease 10/04/2019: MammaPrint: Low risk 01/05/2020: Completed adjuvant radiation 08/04/2019: Genetics: Negative November 2020: Tamoxifen ------------------------------------------------------------------- 09/28/2022: Patient presented with platelets 65 INR 1.9 elevated LFTs and hypercalcemia  CT CAP 09/29/2022: Mediastinal and hilar lymphadenopathy suggestive of metastatic disease.  Patchy tree-in-bud nodularity in the lungs, partially necrotic mass right hepatic lobe 12 cm, also 2.5 cm lesion lytic bone metastases in spine pelvis and left scapula   Treatment plan: Liver biopsy: Poorly diff cancer ER 30-40%, PR:0%, Her 2 Neg Verzinio with AI started 10/24/2022 discontinued May 2024 due to progression in the liver 3.  Bone Marrow Biopsy: 10/13/22: Positive for met breast cancer:   4.  Hospitalization 01/11/2023-01/21/2023: Severe jaundice due to progression in the liver 5.  Perative radiation to the spine  -------------------------------------------------------------------------------------------------------------------- Current treatment: Enhertu cycle 7 (started 01/15/2023) Enhertu toxicities: None   Lab review:  01/26/2023: Hemoglobin 9.3, platelets 35, AST 171, ALT 60, alkaline phosphatase 398, total bilirubin 2.8 (improved from 5) 02/05/2023: Hemoglobin 9.8, platelets 52, AST 134, ALT 38, bilirubin 2.5, alkaline phosphatase 358, albumin 3.1 04/09/2023: Hemoglobin 10.6, platelets 127, bilirubin 1.5, AST 43   Appetite and energy levels are slowly improving with time. Ascites: S/P paracentesis 03/02/23 Insomnia issues: Takes lorazepam Constipation: Stool softener MiraLAX   CT CAP 03/11/2023: Significant interval decrease in size of bulky liver masses, consistent with treatment response of hepatic metastatic disease (12.2 cm to 9.1 cm, 4.7 cm is now 2.9 cm), mod ascites, bone mets unchanged   She's now able to walk  without a wheelchair and has not needed oxygen. Also rode her horse.   PET CT scan 06/07/2023: No hypermetabolic activity in the liver or bones.  Indicative of fabulous response to treatment. Back pain: Recommended supportive care measures. Return to clinic in 3 weeks for cycle 8    No orders of the defined types were placed in this encounter.  The patient has a good understanding of the overall plan. she agrees with it. she will call with any problems that may develop before the next visit here. Total time spent: 30 mins including face to face time and time spent for planning, charting and co-ordination of care   Tamsen Meek, MD 06/11/23

## 2023-06-11 NOTE — Assessment & Plan Note (Signed)
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  Patchy tree-in-bud nodularity in the lungs, partially necrotic mass right hepatic lobe 12 cm, also 2.5 cm lesion lytic bone metastases in spine pelvis and left scapula   Treatment plan: Liver biopsy: Poorly diff cancer ER 30-40%, PR:0%, Her 2 Neg Verzinio with AI started 10/24/2022 discontinued May 2024 due to progression in the liver 3.  Bone Marrow Biopsy: 10/13/22: Positive for met breast cancer:   4.  Hospitalization 01/11/2023-01/21/2023: Severe jaundice due to progression in the liver 5.  Perative radiation to the spine  -------------------------------------------------------------------------------------------------------------------- Current treatment: Enhertu cycle 7 (started 01/15/2023) Enhertu toxicities: None   Lab review:  01/26/2023: Hemoglobin 9.3, platelets 35, AST 171, ALT 60, alkaline phosphatase 398, total bilirubin 2.8 (improved from 5) 02/05/2023: Hemoglobin 9.8, platelets 52, AST 134, ALT 38, bilirubin 2.5, alkaline phosphatase 358, albumin 3.1 04/09/2023: Hemoglobin 10.6, platelets 127, bilirubin 1.5, AST 43   Appetite and energy levels are slowly improving with time. Ascites: S/P paracentesis 03/02/23 Insomnia issues: Takes lorazepam Constipation: Stool softener MiraLAX   CT CAP 03/11/2023: Significant interval decrease in size of bulky liver masses, consistent with  treatment response of hepatic metastatic disease (12.2 cm to 9.1 cm, 4.7 cm is now 2.9 cm), mod ascites, bone mets unchanged   She's now able to walk without a wheelchair and has not needed oxygen. Also rode her horse.   PET CT scan 06/07/2023:   Return to clinic in 3 weeks for cycle 8

## 2023-06-15 ENCOUNTER — Ambulatory Visit: Payer: Medicare HMO | Admitting: Pharmacist

## 2023-06-15 NOTE — Progress Notes (Signed)
[facility]       Telephone: [phone]?Fax: [phone]   Oncology Clinical Pharmacist Practitioner Progress Note  Theresa [name] is a 67 y.o. female with a diagnosis of metastatic breast cancer currently on fam-trastuzumab deruxtecan (Enhertu) under the care of Dr. Serena Croissant.   I connected with Theresa [name] today by telephone and verified that I was speaking with the correct person using two patient identifiers. I discussed the limitations, risks, security and privacy concerns of performing an evaluation and management service by telemedicine and the availability of in-person appointments. The patient/caregiver expressed understanding and agreed to proceed.  Other persons participating in the visit and their role in the encounter: none   Patient's location: home  Provider's location: clinic  Theresa [name] had reached out to clinical pharmacy and left a voicemail.  She had questions about any possible options for hair thinning.  We did discuss that fam-trastuzumab deruxtecan-nxki has an incidence of alopecia in up to 46% of patients.  We discussed that some patients will trial topical minoxidil for women or oral biotin.  However, we did discuss that solid clinical evidence for these over-the-counter agents is lacking.  Theresa [name] said that she verbalized understanding of the plan and said she would consider. Overall, she states that she is tolerating fam-trastuzumab deruxtecan-nxki very well.  Her recent scans that were reviewed by Dr. Pamelia Hoit with Theresa [name] showed an excellent response to fam-trastuzumab deruxtecan-nxki.  Theresa [name] participated in the discussion, expressed understanding, and voiced agreement with the above plan. All questions were answered to her satisfaction. The patient was advised to contact the clinic at [phone] with any questions or concerns prior to her return visit.  Her next visit is scheduled on 07/05/23 with port flush with labs, Dr.  Pamelia Hoit visit, and next cycle of fam-trastuzumab deruxtecan-nxki (Enhertu)  Clinical pharmacy will continue to support Theresa [name] and Dr. Serena Croissant as needed.  Theresa [name] A. Odetta Pink, PharmD, BCOP, CPP  Anselm Lis, RPH-CPP,  06/15/2023  3:24 PM   **Disclaimer: This note was dictated with voice recognition software. Similar sounding words can inadvertently be transcribed and this note may contain transcription errors which may not have been corrected upon publication of note.**

## 2023-06-16 ENCOUNTER — Encounter: Payer: Self-pay | Admitting: Hematology and Oncology

## 2023-06-28 NOTE — Progress Notes (Deleted)
67 y.o. G87P1001 Married Caucasian female here for annual exam.    PCP: Carylon Perches, MD   Patient's last menstrual period was 09/01/2007 (exact date).           Sexually active: Yes.    The current method of family planning is status post hysterectomy.    Exercising: {yes no:314532}  {types:19826} Smoker:  former  OB History  Gravida Para Term Preterm AB Living  1 1 1     1   SAB IAB Ectopic Multiple Live Births               # Outcome Date GA Lbr Len/2nd Weight Sex Type Anes PTL Lv  1 Term 1997 [redacted]w[redacted]d   M Vag-Spont        Health Maintenance: Pap:  06/23/19 neg: HR HPV neg, 06/09/17 neg: HR HPV neg History of abnormal Pap:  no MMG: 06/23/22 Breast Density Cat C, BI-RADS CAT 2 benign Colonoscopy:   HM Colonoscopy          Colonoscopy (Every 10 Years) Next due on 02/11/2031    02/10/2021  HM COLONOSCOPY   Only the first 1 history entries have been loaded, but more history exists.           BMD:  07/16/21  Result  WNL  HIV: 01/12/23 NR Hep C: 01/11/23 NR  Immunization History  Administered Date(s) Administered   Influenza, Quadrivalent, Recombinant, Inj, Pf 06/23/2019   Moderna Sars-Covid-2 Vaccination 09/12/2020     {Labs (Optional):23779}   reports that she quit smoking about 27 years ago. Her smoking use included cigarettes. She has never used smokeless tobacco. She reports that she does not currently use alcohol. She reports that she does not use drugs.  Past Medical History:  Diagnosis Date   Breast cancer, right (HCC)    Contact lens/glasses fitting    wears contacts or glasses   Diarrhea    --post gallbladder surgery   Family history of bone cancer    Family history of brain cancer    Family history of stomach cancer    Family history of uterine cancer    Fibroids    History of radiation therapy    Hypertension    No pertinent past medical history    Personal history of radiation therapy    PONV (postoperative nausea and vomiting)    Right  ovarian cyst    Vertigo     Past Surgical History:  Procedure Laterality Date   BLADDER SUSPENSION     BREAST BIOPSY  09/08/2012   Procedure: BREAST BIOPSY;  Surgeon: Adolph Pollack, MD;  Location: [facility];  Service: General;  Laterality: Left;  remove left breast mass   BREAST LUMPECTOMY Right 10/2019   BREAST LUMPECTOMY WITH RADIOACTIVE SEED AND SENTINEL LYMPH NODE BIOPSY Right 10/04/2019   Procedure: RIGHT BREAST LUMPECTOMY WITH BRACKETED RADIOACTIVE SEEDS, RIGHT BREAST RADIOACTIVE SEED GUIDED EXCISION BIOPSY, AND RIGHT SENTINEL LYMPH NODE BIOPSY;  Surgeon: Almond Lint, MD;  Location: [facility];  Service: General;  Laterality: Right;   BREAST SURGERY     lumpectomy x2   BUNIONECTOMY Right 01/2016   CHOLECYSTECTOMY     COLONOSCOPY     CYSTOSCOPY N/A 01/16/2020   Procedure: CYSTOSCOPY;  Surgeon: Patton Salles, MD;  Location: [facility];  Service: Gynecology;  Laterality: N/A;   IR BONE TUMOR(S)RF ABLATION  12/10/2022   IR IMAGING GUIDED PORT INSERTION  01/18/2023   IR KYPHO  THORACIC WITH BONE BIOPSY  12/10/2022   IR RADIOLOGIST EVAL & MGMT  12/24/2022   MASTOPEXY Right 10/04/2019   Procedure: RIGHT BREAST MASTOPEXY;  Surgeon: Almond Lint, MD;  Location: [facility];  Service: General;  Laterality: Right;   RE-EXCISION OF BREAST LUMPECTOMY Right 10/31/2019   Procedure: RIGHT RE-EXCISION OF BREAST LUMPECTOMY;  Surgeon: Almond Lint, MD;  Location: [facility];  Service: General;  Laterality: Right;   TOTAL LAPAROSCOPIC HYSTERECTOMY WITH BILATERAL SALPINGO OOPHORECTOMY N/A 01/16/2020   Procedure: TOTAL LAPAROSCOPIC HYSTERECTOMY WITH BILATERAL SALPINGO OOPHORECTOMY/COLLECTION OF PELVIC WASHINGS, LYSIS OF ADHESIONS;  Surgeon: Patton Salles, MD;  Location: [facility];  Service: Gynecology;  Laterality: N/A;  collection of pelvic washings   TUBAL LIGATION      Current Outpatient  Medications  Medication Sig Dispense Refill   dexamethasone (DECADRON) 4 MG tablet Take 1 tablet (4 mg total) by mouth daily. Take 1 tablet day chemotherapy 1 tablet 2 days after chemo with food 30 tablet 0   docusate sodium (COLACE) 100 MG capsule Take 1 capsule (100 mg total) by mouth daily. 30 capsule 6   furosemide (LASIX) 20 MG tablet TAKE 1 TABLET BY MOUTH DAILY WITH BREAKFAST 90 tablet 1   LORazepam (ATIVAN) 0.5 MG tablet TAKE 1 TABLET (0.5 MG TOTAL) BY MOUTH EVERY 8 (EIGHT) HOURS AS NEEDED. FOR ANXIETY 15 tablet 3   omeprazole (PRILOSEC) 20 MG capsule TAKE 1 CAPSULE BY MOUTH 2 (TWO) TIMES DAILY BEFORE A MEAL. 180 capsule 3   ondansetron (ZOFRAN) 8 MG tablet Take 1 tablet (8 mg total) by mouth every 8 (eight) hours as needed for nausea or vomiting. 30 tablet 2   spironolactone (ALDACTONE) 25 MG tablet TAKE 1 TABLET (25 MG TOTAL) BY MOUTH DAILY. 90 tablet 1   venlafaxine XR (EFFEXOR-XR) 37.5 MG 24 hr capsule Take 1 capsule (37.5 mg total) by mouth daily. 90 capsule 4   No current facility-administered medications for this visit.    Family History  Problem Relation Age of Onset   Hypertension Father    Stomach cancer Father        may have been colon, diagnosed in his 78s   Depression Mother    Dementia Mother    Thyroid disease Sister    Heart disease Sister    Brain cancer Maternal Aunt 73   Cancer Maternal Grandmother        undetermined type, diagnosed in her 48s   Bone cancer Paternal Grandfather 25   Uterine cancer Paternal Aunt 69   Uterine cancer Cousin        paternal 1st cousin, diagnosed in her late 66s   Uterine cancer Cousin 13       paternal 1st cousin    Review of Systems  Exam:   LMP 09/01/2007 (Exact Date)     General appearance: alert, cooperative and appears stated age Head: normocephalic, without obvious abnormality, atraumatic Neck: no adenopathy, supple, symmetrical, trachea midline and thyroid normal to inspection and palpation Lungs: clear to  auscultation bilaterally Breasts: normal appearance, no masses or tenderness, No nipple retraction or dimpling, No nipple discharge or bleeding, No axillary adenopathy Heart: regular rate and rhythm Abdomen: soft, non-tender; no masses, no organomegaly Extremities: extremities normal, atraumatic, no cyanosis or edema Skin: skin color, texture, turgor normal. No rashes or lesions Lymph nodes: cervical, supraclavicular, and axillary nodes normal. Neurologic: grossly normal  Pelvic: External genitalia:  no lesions  No abnormal inguinal nodes palpated.              Urethra:  normal appearing urethra with no masses, tenderness or lesions              Bartholins and Skenes: normal                 Vagina: normal appearing vagina with normal color and discharge, no lesions              Cervix: no lesions              Pap taken: {yes no:314532} Bimanual Exam:  Uterus:  normal size, contour, position, consistency, mobility, non-tender              Adnexa: no mass, fullness, tenderness              Rectal exam: {yes no:314532}.  Confirms.              Anus:  normal sphincter tone, no lesions  Chaperone was present for exam:  {BSCHAPERONE:31226::"Cace Osorto F, CMA"}   Assessment and Plan:   ***  Mammogram screening discussed. Self breast awareness reviewed. Guidelines for Calcium, Vitamin D, regular exercise program including cardiovascular and weight bearing exercise.   No follow-ups on file.   Additional counseling given.  {yes [id number]. _______ minutes face to face time of which over 50% was spent in counseling.    After visit summary provided.

## 2023-07-02 MED FILL — Fosaprepitant Dimeglumine For IV Infusion 150 MG (Base Eq): INTRAVENOUS | Qty: 5 | Status: AC

## 2023-07-05 ENCOUNTER — Inpatient Hospital Stay (HOSPITAL_BASED_OUTPATIENT_CLINIC_OR_DEPARTMENT_OTHER): Payer: Medicare HMO | Admitting: Hematology and Oncology

## 2023-07-05 ENCOUNTER — Inpatient Hospital Stay: Payer: Medicare HMO | Attending: Hematology and Oncology

## 2023-07-05 ENCOUNTER — Inpatient Hospital Stay: Payer: Medicare HMO

## 2023-07-05 VITALS — BP 128/74 | HR 93

## 2023-07-05 VITALS — BP 142/69 | HR 94 | Temp 97.8°F | Resp 18 | Ht 65.0 in | Wt 153.7 lb

## 2023-07-05 DIAGNOSIS — Z170 Estrogen receptor positive status [ER+]: Secondary | ICD-10-CM

## 2023-07-05 DIAGNOSIS — Z923 Personal history of irradiation: Secondary | ICD-10-CM | POA: Insufficient documentation

## 2023-07-05 DIAGNOSIS — C50311 Malignant neoplasm of lower-inner quadrant of right female breast: Secondary | ICD-10-CM

## 2023-07-05 DIAGNOSIS — Z79899 Other long term (current) drug therapy: Secondary | ICD-10-CM | POA: Diagnosis not present

## 2023-07-05 DIAGNOSIS — C7951 Secondary malignant neoplasm of bone: Secondary | ICD-10-CM | POA: Insufficient documentation

## 2023-07-05 DIAGNOSIS — C419 Malignant neoplasm of bone and articular cartilage, unspecified: Secondary | ICD-10-CM

## 2023-07-05 DIAGNOSIS — Z5112 Encounter for antineoplastic immunotherapy: Secondary | ICD-10-CM | POA: Diagnosis present

## 2023-07-05 DIAGNOSIS — C787 Secondary malignant neoplasm of liver and intrahepatic bile duct: Secondary | ICD-10-CM | POA: Insufficient documentation

## 2023-07-05 LAB — CBC WITH DIFFERENTIAL (CANCER CENTER ONLY)
Abs Immature Granulocytes: 0.01 10*3/uL (ref 0.00–0.07)
Basophils Absolute: 0 10*3/uL (ref 0.0–0.1)
Basophils Relative: 1 %
Eosinophils Absolute: 0.1 10*3/uL (ref 0.0–0.5)
Eosinophils Relative: 2 %
HCT: 33.2 % — ABNORMAL LOW (ref 36.0–46.0)
Hemoglobin: 11.6 g/dL — ABNORMAL LOW (ref 12.0–15.0)
Immature Granulocytes: 0 %
Lymphocytes Relative: 21 %
Lymphs Abs: 0.9 10*3/uL (ref 0.7–4.0)
MCH: 37.9 pg — ABNORMAL HIGH (ref 26.0–34.0)
MCHC: 34.9 g/dL (ref 30.0–36.0)
MCV: 108.5 fL — ABNORMAL HIGH (ref 80.0–100.0)
Monocytes Absolute: 0.5 10*3/uL (ref 0.1–1.0)
Monocytes Relative: 12 %
Neutro Abs: 2.6 10*3/uL (ref 1.7–7.7)
Neutrophils Relative %: 64 %
Platelet Count: 119 10*3/uL — ABNORMAL LOW (ref 150–400)
RBC: 3.06 MIL/uL — ABNORMAL LOW (ref 3.87–5.11)
RDW: 16.3 % — ABNORMAL HIGH (ref 11.5–15.5)
WBC Count: 4.1 10*3/uL (ref 4.0–10.5)
nRBC: 0 % (ref 0.0–0.2)

## 2023-07-05 LAB — CMP (CANCER CENTER ONLY)
ALT: 18 U/L (ref 0–44)
AST: 38 U/L (ref 15–41)
Albumin: 3.6 g/dL (ref 3.5–5.0)
Alkaline Phosphatase: 122 U/L (ref 38–126)
Anion gap: 5 (ref 5–15)
BUN: 10 mg/dL (ref 8–23)
CO2: 26 mmol/L (ref 22–32)
Calcium: 8.7 mg/dL — ABNORMAL LOW (ref 8.9–10.3)
Chloride: 107 mmol/L (ref 98–111)
Creatinine: 0.63 mg/dL (ref 0.44–1.00)
GFR, Estimated: >60 mL/min (ref >60)
Glucose, Bld: 133 mg/dL — ABNORMAL HIGH (ref 70–99)
Potassium: 4.1 mmol/L (ref 3.5–5.1)
Sodium: 138 mmol/L (ref 135–145)
Total Bilirubin: 1.3 mg/dL — ABNORMAL HIGH (ref <1.2)
Total Protein: 5.9 g/dL — ABNORMAL LOW (ref 6.5–8.1)

## 2023-07-05 MED ORDER — PALONOSETRON HCL INJECTION 0.25 MG/5ML
0.2500 mg | Freq: Once | INTRAVENOUS | Status: AC
  Administered 2023-07-05: 0.25 mg via INTRAVENOUS
  Filled 2023-07-05: qty 5

## 2023-07-05 MED ORDER — ACETAMINOPHEN 325 MG PO TABS
650.0000 mg | ORAL_TABLET | Freq: Once | ORAL | Status: AC
  Administered 2023-07-05: 650 mg via ORAL
  Filled 2023-07-05: qty 2

## 2023-07-05 MED ORDER — SODIUM CHLORIDE 0.9% FLUSH
10.0000 mL | Freq: Once | INTRAVENOUS | Status: AC
  Administered 2023-07-05: 10 mL

## 2023-07-05 MED ORDER — SODIUM CHLORIDE 0.9 % IV SOLN
150.0000 mg | Freq: Once | INTRAVENOUS | Status: AC
  Administered 2023-07-05: 150 mg via INTRAVENOUS
  Filled 2023-07-05: qty 150

## 2023-07-05 MED ORDER — DEXAMETHASONE SODIUM PHOSPHATE 10 MG/ML IJ SOLN
10.0000 mg | Freq: Once | INTRAMUSCULAR | Status: AC
  Administered 2023-07-05: 10 mg via INTRAVENOUS
  Filled 2023-07-05: qty 1

## 2023-07-05 MED ORDER — FAM-TRASTUZUMAB DERUXTECAN-NXKI CHEMO 100 MG IV SOLR
4.4000 mg/kg | Freq: Once | INTRAVENOUS | Status: AC
  Administered 2023-07-05: 300 mg via INTRAVENOUS
  Filled 2023-07-05: qty 15

## 2023-07-05 MED ORDER — HEPARIN SOD (PORK) LOCK FLUSH 100 UNIT/ML IV SOLN
500.0000 [IU] | Freq: Once | INTRAVENOUS | Status: AC | PRN
  Administered 2023-07-05: 500 [IU]

## 2023-07-05 MED ORDER — SODIUM CHLORIDE 0.9% FLUSH
10.0000 mL | INTRAVENOUS | Status: DC | PRN
Start: 2023-07-05 — End: 2023-07-05
  Administered 2023-07-05: 10 mL

## 2023-07-05 MED ORDER — DEXTROSE 5 % IV SOLN
Freq: Once | INTRAVENOUS | Status: AC
Start: 2023-07-05 — End: 2023-07-05

## 2023-07-05 MED ORDER — DIPHENHYDRAMINE HCL 25 MG PO CAPS
50.0000 mg | ORAL_CAPSULE | Freq: Once | ORAL | Status: AC
  Administered 2023-07-05: 50 mg via ORAL
  Filled 2023-07-05: qty 2

## 2023-07-05 NOTE — Assessment & Plan Note (Signed)
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  Patchy tree-in-bud nodularity in the lungs, partially necrotic mass right hepatic lobe 12 cm, also 2.5 cm lesion lytic bone metastases in spine pelvis and left scapula   Treatment plan: Liver biopsy: Poorly diff cancer ER 30-40%, PR:0%, Her 2 Neg Verzinio with AI started 10/24/2022 discontinued May 2024 due to progression in the liver 3.  Bone Marrow Biopsy: 10/13/22: Positive for met breast cancer:   4.  Hospitalization 01/11/2023-01/21/2023: Severe jaundice due to progression in the liver 5.  Perative radiation to the spine  -------------------------------------------------------------------------------------------------------------------- Current treatment: Enhertu cycle 9 (started 01/15/2023) Enhertu toxicities: None  PET CT scan 06/11/2023: No evidence of recurrent/metastatic disease.  Cirrhotic/pseudocirrhosis liver, treated bone metastases  Return to clinic every 3 weeks for Enhertu

## 2023-07-05 NOTE — Progress Notes (Signed)
Patient Care Team: Carylon Perches, MD as PCP - General (Internal Medicine) Almond Lint, MD as Consulting Physician (General Surgery) Lonie Peak, MD as Attending Physician (Radiation Oncology) Patton Salles, MD as Consulting Physician (Obstetrics and Gynecology) Venancio Poisson, MD as Consulting Physician (Dermatology) Crist Fat, MD as Consulting Physician (Urology) Charna Elizabeth, MD as Consulting Physician (Gastroenterology) Sheral Apley, MD as Attending Physician (Orthopedic Surgery) Serena Croissant, MD as Consulting Physician (Hematology and Oncology)  DIAGNOSIS:  Encounter Diagnoses  Name Primary?   Malignant neoplasm of lower-inner quadrant of right breast of female, estrogen receptor positive (HCC) Yes   Cancer, metastatic to bone (HCC)     SUMMARY OF ONCOLOGIC HISTORY: Oncology History  Malignant neoplasm of lower-inner quadrant of right breast of female, estrogen receptor positive (HCC)  07/03/2019 Initial Diagnosis   right lower inner quadrant biopsy, for a clinicaly multiple T1c N0, stage IA invasive ductal carcinoma, grade 2, E-cadherin positive, strongly estrogen receptor positive but progesterone receptor negative and HER-2 not amplified, with an MIB-1 of 5%             (a) breast MRI 08/07/2019 showed 2 additional suspicious areas in the right breast and 1 in the left breast              (b) biopsy of all 3 areas 08/21/2019 showed no malignancy   07/26/2019 -  Anti-estrogen oral therapy   tamoxifen started neoadjuvantly 07/26/2019; to be continued for 10 years.  S/p TAH/BSO on 01/16/2020 with benign pathology   08/04/2019 Genetic Testing   Negative genetic testing:  No pathogenic variants detected on the [facility] Breast Cancer STAT panel or the Common Hereditary Cancers panel. The report date is 08/04/2019.  The STAT Breast cancer panel offered by [facility] includes sequencing and rearrangement analysis for the following 9 genes:  ATM, BRCA1, BRCA2,  CDH1, CHEK2, PALB2, PTEN, STK11 and TP53.  The Common Hereditary Cancers Panel offered by [facility] includes sequencing and/or deletion duplication testing of the following 48 genes: APC, ATM, AXIN2, BARD1, BMPR1A, BRCA1, BRCA2, BRIP1, CDH1, CDK4, CDKN2A (p14ARF), CDKN2A (p16INK4a), CHEK2, CTNNA1, DICER1, EPCAM (Deletion/duplication testing only), GREM1 (promoter region deletion/duplication testing only), KIT, MEN1, MLH1, MSH2, MSH3, MSH6, MUTYH, NBN, NF1, NHTL1, PALB2, PDGFRA, PMS2, POLD1, POLE, PTEN, RAD50, RAD51C, RAD51D, RNF43, SDHB, SDHC, SDHD, SMAD4, SMARCA4. STK11, TP53, TSC1, TSC2, and VHL.  The following genes were evaluated for sequence changes only: SDHA and HOXB13 c.251G>A variant only.    10/04/2019 Cancer Staging   Staging form: Breast, AJCC 8th Edition - Pathologic stage from 10/04/2019: Stage IIB (pT2, pN1a(sn), cM0, G2, ER+, PR-, HER2-) - Signed by Loa Socks, NP on 10/18/2019   10/04/2019 Surgery    right lumpectomy and sentinel lymph node sampling 10/04/2019 showed a pT2 pN1, stage IIB invasive ductal carcinoma, grade 2, with positive lymphovascular invasion and a positive superior margin             (a) 3 sentinel lymph nodes removed, one with macro metastatic deposit, 1 with a micrometastatic deposit             (b) additional surgery for margin clearance   10/04/2019 Miscellaneous    MammaPrint on the 10/04/2019 sample shows a low risk luminal A tumor predicting a 5-year metastasis free survival of 96% without chemotherapy, and a chemotherapy benefit of less than 1.5%    11/27/2019 - 01/05/2020 Radiation Therapy    Site Technique Total Dose (Gy) Dose per Fx (Gy) Completed Fx Beam Energies  Breast, Right: Breast_Rt 3D 50/50 2 25/25 6X, 10X  Breast, Right: Breast_Rt_SCV_PAB 3D 50/50 2 25/25 6X, 10X  Breast, Right: Breast_Rt_Bst 3D 10/10 2 5/5 6X, 10X     09/29/2022 Imaging   IMPRESSION: 1. Surgical changes involving the right medial breast. No obvious recurrent breast mass  or chest wall mass. 2. Mediastinal and hilar lymphadenopathy suggesting metastatic disease. 3. Patchy tree-in-bud type nodularity in the lungs suggesting chronic inflammation or atypical infection such as MAC. No definite pulmonary metastatic nodules. 4. Large partially necrotic mass involving most of the right hepatic lobe and a 2.5 cm lesion in segment 3 of the liver consistent with metastatic disease. 5. 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   10/06/2022 Initial Biopsy   Liver biopsy: + for malignancy, metastatic poorly differentiated carcinoma, likely breast origin, biomarkers pending, insufficient tissue for molecular testing. (Resulted in EPIC on 10/13/2022)   10/07/2022 PET scan   PET scan on 10/07/2022 that showed hypermetabolic metastatic breast cancer with hypermetabolic mediastinal and hilar adenopathy, hypermetabolic hepatic metastatic disease and bone disease.  There were no findings for pulmonary metastatic disease.   10/21/2022 -  Anti-estrogen oral therapy   Verzinio and Letrozole   01/15/2023 -  Chemotherapy   Patient is on Treatment Plan : BREAST METASTATIC Fam-Trastuzumab Deruxtecan-nxki (Enhertu) (5.4) q21d     02/19/2023 Cancer Staging   Staging form: Breast, AJCC 8th Edition - Pathologic: Stage IV (pM1) - Signed by Loa Socks, NP on 02/19/2023   Cancer, metastatic to bone (HCC)  12/21/2022 Initial Diagnosis   Cancer, metastatic to bone (HCC)   01/15/2023 -  Chemotherapy   Patient is on Treatment Plan : BREAST METASTATIC Fam-Trastuzumab Deruxtecan-nxki (Enhertu) (5.4) q21d       CHIEF COMPLIANT: Enhertu cycle 9  HISTORY OF PRESENT ILLNESS:   History of Present Illness   The patient, with a history of back pain and currently undergoing for metastatic breast cancer on Enhertu, presents in good overall health. She has resumed horse riding, being careful with her back due to previous pain, but reports that it has  been feeling good. The patient has been experiencing hair loss, which she attributes to her treatment, and poor fingernail condition. She has an upcoming appointment with her gynecologist, but questions the need for it as she believes all necessary procedures have been done. The patient also discusses her reluctance to get a flu shot and her concerns about the COVID-19 vaccine.       ALLERGIES:  is allergic to metronidazole, other, and tape.  MEDICATIONS:  Current Outpatient Medications  Medication Sig Dispense Refill   dexamethasone (DECADRON) 4 MG tablet Take 1 tablet (4 mg total) by mouth daily. Take 1 tablet day chemotherapy 1 tablet 2 days after chemo with food 30 tablet 0   docusate sodium (COLACE) 100 MG capsule Take 1 capsule (100 mg total) by mouth daily. 30 capsule 6   furosemide (LASIX) 20 MG tablet TAKE 1 TABLET BY MOUTH DAILY WITH BREAKFAST 90 tablet 1   LORazepam (ATIVAN) 0.5 MG tablet TAKE 1 TABLET (0.5 MG TOTAL) BY MOUTH EVERY 8 (EIGHT) HOURS AS NEEDED. FOR ANXIETY 15 tablet 3   omeprazole (PRILOSEC) 20 MG capsule TAKE 1 CAPSULE BY MOUTH 2 (TWO) TIMES DAILY BEFORE A MEAL. 180 capsule 3   ondansetron (ZOFRAN) 8 MG tablet Take 1 tablet (8 mg total) by mouth every 8 (eight) hours as needed for nausea or vomiting. 30 tablet 2   spironolactone (  ALDACTONE) 25 MG tablet TAKE 1 TABLET (25 MG TOTAL) BY MOUTH DAILY. 90 tablet 1   venlafaxine XR (EFFEXOR-XR) 37.5 MG 24 hr capsule Take 1 capsule (37.5 mg total) by mouth daily. 90 capsule 4   No current facility-administered medications for this visit.   Facility-Administered Medications Ordered in Other Visits  Medication Dose Route Frequency Provider Last Rate Last Admin   sodium chloride flush (NS) 0.9 % injection 10 mL  10 mL Intracatheter PRN Serena Croissant, MD   10 mL at 07/05/23 1332    PHYSICAL EXAMINATION: ECOG PERFORMANCE STATUS: 1 - Symptomatic but completely ambulatory  Vitals:   07/05/23 1112  BP: (!) 142/69  Pulse: 94   Resp: 18  Temp: 97.8 F (36.6 C)  SpO2: 100%   Filed Weights   07/05/23 1112  Weight: 153 lb 11.2 oz (69.7 kg)      LABORATORY DATA:  I have reviewed the data as listed    Latest Ref Rng & Units 07/05/2023   10:51 AM 06/11/2023    9:31 AM 05/21/2023    8:44 AM  CMP  Glucose 70 - 99 mg/dL 034  742  595   BUN 8 - 23 mg/dL 10  10  8    Creatinine 0.44 - 1.00 mg/dL 6.38  7.56  4.33   Sodium 135 - 145 mmol/L 138  139  137   Potassium 3.5 - 5.1 mmol/L 4.1  3.7  4.1   Chloride 98 - 111 mmol/L 107  109  107   CO2 22 - 32 mmol/L 26  25  25    Calcium 8.9 - 10.3 mg/dL 8.7  8.7  8.8   Total Protein 6.5 - 8.1 g/dL 5.9  5.5  5.7   Total Bilirubin <1.2 mg/dL 1.3  1.5  1.5   Alkaline Phos 38 - 126 U/L 122  125  144   AST 15 - 41 U/L 38  36  43   ALT 0 - 44 U/L 18  17  21      Lab Results  Component Value Date   WBC 4.1 07/05/2023   HGB 11.6 (L) 07/05/2023   HCT 33.2 (L) 07/05/2023   MCV 108.5 (H) 07/05/2023   PLT 119 (L) 07/05/2023   NEUTROABS 2.6 07/05/2023    ASSESSMENT & PLAN:  Malignant neoplasm of lower-inner quadrant of right breast of female, estrogen receptor positive (HCC) 07/03/2019: Right breast LIQ T1CN0 stage Ia grade 2 IDC ER 95% PR negative HER2 negative Ki-67 5% 10/04/2019: Right lumpectomy: T2N1 stage IIb grade 2 IDC positive lymphovascular invasion 3 lymph nodes removed 1 with macro N1 micrometastatic disease 10/04/2019: MammaPrint: Low risk 01/05/2020: Completed adjuvant radiation 08/04/2019: Genetics: Negative November 2020: Tamoxifen ------------------------------------------------------------------- 09/28/2022: Patient presented with platelets 65 INR 1.9 elevated LFTs and hypercalcemia  CT CAP 09/29/2022: Mediastinal and hilar lymphadenopathy suggestive of metastatic disease.  Patchy tree-in-bud nodularity in the lungs, partially necrotic mass right hepatic lobe 12 cm, also 2.5 cm lesion lytic bone metastases in spine pelvis and left scapula   Treatment plan: Liver  biopsy: Poorly diff cancer ER 30-40%, PR:0%, Her 2 Neg Verzinio with AI started 10/24/2022 discontinued May 2024 due to progression in the liver 3.  Bone Marrow Biopsy: 10/13/22: Positive for met breast cancer:   4.  Hospitalization 01/11/2023-01/21/2023: Severe jaundice due to progression in the liver 5.  Perative radiation to the spine  -------------------------------------------------------------------------------------------------------------------- Current treatment: Enhertu cycle 9 (started 01/15/2023) Enhertu toxicities: None  PET CT scan 06/11/2023: No evidence of recurrent/metastatic  disease.  Cirrhotic/pseudocirrhosis liver, treated bone metastases  Return to clinic every 3 weeks for Enhertu ------------------------------------- Assessment and Plan    Back Pain Stable with no exacerbation during recent physical activities including horse riding and hiking. Pain worsens with vacuuming. -Continue current management and caution with activities that exacerbate pain.  Hair Loss Likely secondary to ongoing cancer treatment. Patient experiencing distress due to hair loss. -Consider wig options. Provided prescription for potential insurance coverage.  Insomnia Patient reports difficulty sleeping, possibly due to recent travel and jet lag. Over-the-counter remedies have had mixed results. -Continue use of Z-Quil or similar as needed, avoiding daily use to maintain efficacy.  General Health Maintenance -Annual Medicare exam recommended with primary care provider for comprehensive preventive care. -Flu shot not deemed necessary due to low efficacy and patient's limited exposure to crowded environments. -Follow-up appointment scheduled for July 23, 2023.          No orders of the defined types were placed in this encounter.  The patient has a good understanding of the overall plan. she agrees with it. she will call with any problems that may develop before the next visit  here. Total time spent: 30 mins including face to face time and time spent for planning, charting and co-ordination of care   Theresa Meek, MD 07/05/23

## 2023-07-05 NOTE — Patient Instructions (Signed)
[facility]  Discharge Instructions: Thank you for choosing [facility] to provide your oncology and hematology care.   If you have a lab appointment with the Cancer Center, please go directly to the Cancer Center and check in at the registration area.   Wear comfortable clothing and clothing appropriate for easy access to any Portacath or PICC line.   We strive to give you quality time with your provider. You may need to reschedule your appointment if you arrive late (15 or more minutes).  Arriving late affects you and other patients whose appointments are after yours.  Also, if you miss three or more appointments without notifying the office, you may be dismissed from the clinic at the provider's discretion.      For prescription refill requests, have your pharmacy contact our office and allow 72 hours for refills to be completed.    Today you received the following chemotherapy and/or immunotherapy agents Enhertu.   To help prevent nausea and vomiting after your treatment, we encourage you to take your nausea medication as directed.  BELOW ARE SYMPTOMS THAT SHOULD BE REPORTED IMMEDIATELY: *FEVER GREATER THAN 100.4 F (38 C) OR HIGHER *CHILLS OR SWEATING *NAUSEA AND VOMITING THAT IS NOT CONTROLLED WITH YOUR NAUSEA MEDICATION *UNUSUAL SHORTNESS OF BREATH *UNUSUAL BRUISING OR BLEEDING *URINARY PROBLEMS (pain or burning when urinating, or frequent urination) *BOWEL PROBLEMS (unusual diarrhea, constipation, pain near the anus) TENDERNESS IN MOUTH AND THROAT WITH OR WITHOUT PRESENCE OF ULCERS (sore throat, sores in mouth, or a toothache) UNUSUAL RASH, SWELLING OR PAIN  UNUSUAL VAGINAL DISCHARGE OR ITCHING   Items with * indicate a potential emergency and should be followed up as soon as possible or go to the Emergency Department if any problems should occur.  Please show the CHEMOTHERAPY ALERT CARD or IMMUNOTHERAPY ALERT CARD at check-in  to the Emergency Department and triage nurse.  Should you have questions after your visit or need to cancel or reschedule your appointment, please contact [facility]  Dept: [phone]  and follow the prompts.  Office hours are 8:00 a.m. to 4:30 p.m. Monday - Friday. Please note that voicemails left after 4:00 p.m. may not be returned until the following business day.  We are closed weekends and major holidays. You have access to a nurse at all times for urgent questions. Please call the main number to the clinic Dept: [phone] and follow the prompts.   For any non-urgent questions, you may also contact your provider using MyChart. We now offer e-Visits for anyone 18 and older to request care online for non-urgent symptoms. For details visit [email or web address].   Also download the MyChart app! Go to the app store, search "MyChart", open the app, select [facility], and log in with your MyChart username and password.   

## 2023-07-07 ENCOUNTER — Other Ambulatory Visit: Payer: Self-pay | Admitting: Hematology and Oncology

## 2023-07-07 ENCOUNTER — Encounter: Payer: Self-pay | Admitting: Hematology and Oncology

## 2023-07-07 ENCOUNTER — Other Ambulatory Visit: Payer: Self-pay | Admitting: Adult Health

## 2023-07-07 DIAGNOSIS — C50311 Malignant neoplasm of lower-inner quadrant of right female breast: Secondary | ICD-10-CM

## 2023-07-07 DIAGNOSIS — R180 Malignant ascites: Secondary | ICD-10-CM

## 2023-07-12 ENCOUNTER — Ambulatory Visit: Payer: Medicare HMO | Admitting: Obstetrics and Gynecology

## 2023-07-13 ENCOUNTER — Other Ambulatory Visit: Payer: Self-pay | Admitting: *Deleted

## 2023-07-13 MED ORDER — CIPROFLOXACIN HCL 500 MG PO TABS: 500.0000 mg | ORAL_TABLET | Freq: Two times a day (BID) | ORAL | 0 refills | Status: DC

## 2023-07-13 NOTE — Progress Notes (Signed)
Received call from pt with complaint of UTI symptoms including burning with urination.  Pt also states her BP has been 144/86 and she stopped Valsartan on her own.  Pt states she will f/u with PCP to add BP medication back.  Verbal orders received from MD for pt to be prescribed Cipro 500 mg p.o BID x 7 days.  Prescription sent to pharmacy on file, pt educated and verbalized understanding.

## 2023-07-22 MED FILL — Fosaprepitant Dimeglumine For IV Infusion 150 MG (Base Eq): INTRAVENOUS | Qty: 5 | Status: AC

## 2023-07-23 ENCOUNTER — Inpatient Hospital Stay: Payer: Medicare HMO | Admitting: Hematology and Oncology

## 2023-07-23 ENCOUNTER — Inpatient Hospital Stay: Payer: Medicare HMO

## 2023-07-23 VITALS — BP 122/68 | HR 95

## 2023-07-23 VITALS — BP 127/58 | HR 94 | Temp 97.3°F | Resp 18 | Ht 65.0 in | Wt 152.8 lb

## 2023-07-23 DIAGNOSIS — C7951 Secondary malignant neoplasm of bone: Secondary | ICD-10-CM

## 2023-07-23 DIAGNOSIS — Z170 Estrogen receptor positive status [ER+]: Secondary | ICD-10-CM

## 2023-07-23 DIAGNOSIS — C419 Malignant neoplasm of bone and articular cartilage, unspecified: Secondary | ICD-10-CM

## 2023-07-23 DIAGNOSIS — C50311 Malignant neoplasm of lower-inner quadrant of right female breast: Secondary | ICD-10-CM

## 2023-07-23 DIAGNOSIS — Z5112 Encounter for antineoplastic immunotherapy: Secondary | ICD-10-CM | POA: Diagnosis not present

## 2023-07-23 LAB — CBC WITH DIFFERENTIAL (CANCER CENTER ONLY)
Abs Immature Granulocytes: 0.01 10*3/uL (ref 0.00–0.07)
Basophils Absolute: 0 10*3/uL (ref 0.0–0.1)
Basophils Relative: 1 %
Eosinophils Absolute: 0 10*3/uL (ref 0.0–0.5)
Eosinophils Relative: 1 %
HCT: 33.7 % — ABNORMAL LOW (ref 36.0–46.0)
Hemoglobin: 12 g/dL (ref 12.0–15.0)
Immature Granulocytes: 0 %
Lymphocytes Relative: 11 %
Lymphs Abs: 0.5 10*3/uL — ABNORMAL LOW (ref 0.7–4.0)
MCH: 38.2 pg — ABNORMAL HIGH (ref 26.0–34.0)
MCHC: 35.6 g/dL (ref 30.0–36.0)
MCV: 107.3 fL — ABNORMAL HIGH (ref 80.0–100.0)
Monocytes Absolute: 0.3 10*3/uL (ref 0.1–1.0)
Monocytes Relative: 7 %
Neutro Abs: 3.5 10*3/uL (ref 1.7–7.7)
Neutrophils Relative %: 80 %
Platelet Count: 102 10*3/uL — ABNORMAL LOW (ref 150–400)
RBC: 3.14 MIL/uL — ABNORMAL LOW (ref 3.87–5.11)
RDW: 16.3 % — ABNORMAL HIGH (ref 11.5–15.5)
WBC Count: 4.3 10*3/uL (ref 4.0–10.5)
nRBC: 0 % (ref 0.0–0.2)

## 2023-07-23 LAB — CMP (CANCER CENTER ONLY)
ALT: 21 U/L (ref 0–44)
AST: 42 U/L — ABNORMAL HIGH (ref 15–41)
Albumin: 3.7 g/dL (ref 3.5–5.0)
Alkaline Phosphatase: 131 U/L — ABNORMAL HIGH (ref 38–126)
Anion gap: 6 (ref 5–15)
BUN: 10 mg/dL (ref 8–23)
CO2: 27 mmol/L (ref 22–32)
Calcium: 9.4 mg/dL (ref 8.9–10.3)
Chloride: 104 mmol/L (ref 98–111)
Creatinine: 0.63 mg/dL (ref 0.44–1.00)
GFR, Estimated: >60 mL/min (ref >60)
Glucose, Bld: 148 mg/dL — ABNORMAL HIGH (ref 70–99)
Potassium: 4.3 mmol/L (ref 3.5–5.1)
Sodium: 137 mmol/L (ref 135–145)
Total Bilirubin: 1.2 mg/dL — ABNORMAL HIGH (ref <1.2)
Total Protein: 6 g/dL — ABNORMAL LOW (ref 6.5–8.1)

## 2023-07-23 MED ORDER — FAM-TRASTUZUMAB DERUXTECAN-NXKI CHEMO 100 MG IV SOLR
4.4000 mg/kg | Freq: Once | INTRAVENOUS | Status: AC
  Administered 2023-07-23: 300 mg via INTRAVENOUS
  Filled 2023-07-23: qty 15

## 2023-07-23 MED ORDER — SODIUM CHLORIDE 0.9 % IV SOLN
150.0000 mg | Freq: Once | INTRAVENOUS | Status: AC
  Administered 2023-07-23: 150 mg via INTRAVENOUS
  Filled 2023-07-23: qty 150

## 2023-07-23 MED ORDER — PALONOSETRON HCL INJECTION 0.25 MG/5ML
0.2500 mg | Freq: Once | INTRAVENOUS | Status: AC
  Administered 2023-07-23: 0.25 mg via INTRAVENOUS
  Filled 2023-07-23: qty 5

## 2023-07-23 MED ORDER — DEXTROSE 5 % IV SOLN: Freq: Once | INTRAVENOUS | Status: AC

## 2023-07-23 MED ORDER — ACETAMINOPHEN 325 MG PO TABS
650.0000 mg | ORAL_TABLET | Freq: Once | ORAL | Status: AC
  Administered 2023-07-23: 650 mg via ORAL
  Filled 2023-07-23: qty 2

## 2023-07-23 MED ORDER — DEXAMETHASONE SODIUM PHOSPHATE 10 MG/ML IJ SOLN
10.0000 mg | Freq: Once | INTRAMUSCULAR | Status: AC
Start: 2023-07-23 — End: 2023-07-23
  Administered 2023-07-23: 10 mg via INTRAVENOUS
  Filled 2023-07-23: qty 1

## 2023-07-23 MED ORDER — SODIUM CHLORIDE 0.9% FLUSH
10.0000 mL | Freq: Once | INTRAVENOUS | Status: AC
  Administered 2023-07-23: 10 mL

## 2023-07-23 MED ORDER — SODIUM CHLORIDE 0.9% FLUSH
10.0000 mL | INTRAVENOUS | Status: DC | PRN
  Administered 2023-07-23: 10 mL

## 2023-07-23 MED ORDER — LORAZEPAM 0.5 MG PO TABS: 0.5000 mg | ORAL_TABLET | Freq: Three times a day (TID) | ORAL | 3 refills | Status: DC | PRN

## 2023-07-23 MED ORDER — HEPARIN SOD (PORK) LOCK FLUSH 100 UNIT/ML IV SOLN
500.0000 [IU] | Freq: Once | INTRAVENOUS | Status: AC | PRN
  Administered 2023-07-23: 500 [IU]

## 2023-07-23 MED ORDER — DIPHENHYDRAMINE HCL 25 MG PO CAPS
50.0000 mg | ORAL_CAPSULE | Freq: Once | ORAL | Status: AC
  Administered 2023-07-23: 50 mg via ORAL
  Filled 2023-07-23: qty 2

## 2023-07-23 NOTE — Progress Notes (Signed)
Patient Care Team: Carylon Perches, MD as PCP - General (Internal Medicine) Almond Lint, MD as Consulting Physician (General Surgery) Lonie Peak, MD as Attending Physician (Radiation Oncology) Patton Salles, MD as Consulting Physician (Obstetrics and Gynecology) Venancio Poisson, MD as Consulting Physician (Dermatology) Crist Fat, MD as Consulting Physician (Urology) Charna Elizabeth, MD as Consulting Physician (Gastroenterology) Sheral Apley, MD as Attending Physician (Orthopedic Surgery) Serena Croissant, MD as Consulting Physician (Hematology and Oncology)  DIAGNOSIS:  Encounter Diagnosis  Name Primary?   Malignant neoplasm of lower-inner quadrant of right breast of female, estrogen receptor positive (HCC) Yes    SUMMARY OF ONCOLOGIC HISTORY: Oncology History  Malignant neoplasm of lower-inner quadrant of right breast of female, estrogen receptor positive (HCC)  07/03/2019 Initial Diagnosis   right lower inner quadrant biopsy, for a clinicaly multiple T1c N0, stage IA invasive ductal carcinoma, grade 2, E-cadherin positive, strongly estrogen receptor positive but progesterone receptor negative and HER-2 not amplified, with an MIB-1 of 5%             (a) breast MRI 08/07/2019 showed 2 additional suspicious areas in the right breast and 1 in the left breast              (b) biopsy of all 3 areas 08/21/2019 showed no malignancy   07/26/2019 -  Anti-estrogen oral therapy   tamoxifen started neoadjuvantly 07/26/2019; to be continued for 10 years.  S/p TAH/BSO on 01/16/2020 with benign pathology   08/04/2019 Genetic Testing   Negative genetic testing:  No pathogenic variants detected on the [facility] Breast Cancer STAT panel or the Common Hereditary Cancers panel. The report date is 08/04/2019.  The STAT Breast cancer panel offered by [facility] includes sequencing and rearrangement analysis for the following 9 genes:  ATM, BRCA1, BRCA2, CDH1, CHEK2, PALB2, PTEN, STK11 and  TP53.  The Common Hereditary Cancers Panel offered by [facility] includes sequencing and/or deletion duplication testing of the following 48 genes: APC, ATM, AXIN2, BARD1, BMPR1A, BRCA1, BRCA2, BRIP1, CDH1, CDK4, CDKN2A (p14ARF), CDKN2A (p16INK4a), CHEK2, CTNNA1, DICER1, EPCAM (Deletion/duplication testing only), GREM1 (promoter region deletion/duplication testing only), KIT, MEN1, MLH1, MSH2, MSH3, MSH6, MUTYH, NBN, NF1, NHTL1, PALB2, PDGFRA, PMS2, POLD1, POLE, PTEN, RAD50, RAD51C, RAD51D, RNF43, SDHB, SDHC, SDHD, SMAD4, SMARCA4. STK11, TP53, TSC1, TSC2, and VHL.  The following genes were evaluated for sequence changes only: SDHA and HOXB13 c.251G>A variant only.    10/04/2019 Cancer Staging   Staging form: Breast, AJCC 8th Edition - Pathologic stage from 10/04/2019: Stage IIB (pT2, pN1a(sn), cM0, G2, ER+, PR-, HER2-) - Signed by Loa Socks, NP on 10/18/2019   10/04/2019 Surgery    right lumpectomy and sentinel lymph node sampling 10/04/2019 showed a pT2 pN1, stage IIB invasive ductal carcinoma, grade 2, with positive lymphovascular invasion and a positive superior margin             (a) 3 sentinel lymph nodes removed, one with macro metastatic deposit, 1 with a micrometastatic deposit             (b) additional surgery for margin clearance   10/04/2019 Miscellaneous    MammaPrint on the 10/04/2019 sample shows a low risk luminal A tumor predicting a 5-year metastasis free survival of 96% without chemotherapy, and a chemotherapy benefit of less than 1.5%    11/27/2019 - 01/05/2020 Radiation Therapy    Site Technique Total Dose (Gy) Dose per Fx (Gy) Completed Fx Beam Energies  Breast, Right: Breast_Rt 3D 50/50 2 25/25  6X, 10X  Breast, Right: Breast_Rt_SCV_PAB 3D 50/50 2 25/25 6X, 10X  Breast, Right: Breast_Rt_Bst 3D 10/10 2 5/5 6X, 10X     09/29/2022 Imaging   IMPRESSION: 1. Surgical changes involving the right medial breast. No obvious recurrent breast mass or chest wall mass. 2. Mediastinal  and hilar lymphadenopathy suggesting metastatic disease. 3. Patchy tree-in-bud type nodularity in the lungs suggesting chronic inflammation or atypical infection such as MAC. No definite pulmonary metastatic nodules. 4. Large partially necrotic mass involving most of the right hepatic lobe and a 2.5 cm lesion in segment 3 of the liver consistent with metastatic disease. 5. 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   10/06/2022 Initial Biopsy   Liver biopsy: + for malignancy, metastatic poorly differentiated carcinoma, likely breast origin, biomarkers pending, insufficient tissue for molecular testing. (Resulted in EPIC on 10/13/2022)   10/07/2022 PET scan   PET scan on 10/07/2022 that showed hypermetabolic metastatic breast cancer with hypermetabolic mediastinal and hilar adenopathy, hypermetabolic hepatic metastatic disease and bone disease.  There were no findings for pulmonary metastatic disease.   10/21/2022 -  Anti-estrogen oral therapy   Verzinio and Letrozole   01/15/2023 -  Chemotherapy   Patient is on Treatment Plan : BREAST METASTATIC Fam-Trastuzumab Deruxtecan-nxki (Enhertu) (5.4) q21d     02/19/2023 Cancer Staging   Staging form: Breast, AJCC 8th Edition - Pathologic: Stage IV (pM1) - Signed by Loa Socks, NP on 02/19/2023   Cancer, metastatic to bone (HCC)  12/21/2022 Initial Diagnosis   Cancer, metastatic to bone (HCC)   01/15/2023 -  Chemotherapy   Patient is on Treatment Plan : BREAST METASTATIC Fam-Trastuzumab Deruxtecan-nxki (Enhertu) (5.4) q21d       CHIEF COMPLIANT: F/U on Enhertu  HISTORY OF PRESENT ILLNESS:   History of Present Illness   The patient, with a history of cancer and vertebral issues, presents with trouble sleeping. She reports that she tries not to take lorazepam every night, but occasionally needs it to sleep. She also mentions fatigue, which she attributes to her medical condition. She has been  experiencing back pain, which she describes as tiredness in the large muscles protecting her vertebrae. However, she notes that the pain is not severe and she is able to perform her daily activities, including caring for her horses. She also mentions a recent history of urinary tract infections, for which she has been prescribed Cipro. She has completed the course of Cipro and is considering increasing her water intake to prevent further infections.         ALLERGIES:  is allergic to metronidazole, other, and tape.  MEDICATIONS:  Current Outpatient Medications  Medication Sig Dispense Refill   dexamethasone (DECADRON) 4 MG tablet Take 1 tablet (4 mg total) by mouth daily. Take 1 tablet day chemotherapy 1 tablet 2 days after chemo with food 30 tablet 0   docusate sodium (COLACE) 100 MG capsule Take 1 capsule (100 mg total) by mouth daily. 30 capsule 6   furosemide (LASIX) 20 MG tablet TAKE 1 TABLET BY MOUTH EVERY DAY WITH BREAKFAST 90 tablet 1   LORazepam (ATIVAN) 0.5 MG tablet Take 1 tablet (0.5 mg total) by mouth every 8 (eight) hours as needed. for anxiety 30 tablet 3   omeprazole (PRILOSEC) 20 MG capsule TAKE 1 CAPSULE BY MOUTH 2 (TWO) TIMES DAILY BEFORE A MEAL. 180 capsule 3   ondansetron (ZOFRAN) 8 MG tablet Take 1 tablet (8 mg total) by mouth every 8 (eight) hours  as needed for nausea or vomiting. 30 tablet 2   spironolactone (ALDACTONE) 25 MG tablet TAKE 1 TABLET (25 MG TOTAL) BY MOUTH DAILY. 90 tablet 1   venlafaxine XR (EFFEXOR-XR) 37.5 MG 24 hr capsule Take 1 capsule (37.5 mg total) by mouth daily. 90 capsule 4   No current facility-administered medications for this visit.    PHYSICAL EXAMINATION: ECOG PERFORMANCE STATUS: 1 - Symptomatic but completely ambulatory  Vitals:   07/23/23 1025  BP: (!) 127/58  Pulse: 94  Resp: 18  Temp: (!) 97.3 F (36.3 C)  SpO2: 98%   Filed Weights   07/23/23 1025  Weight: 152 lb 12.8 oz (69.3 kg)      LABORATORY DATA:  I have reviewed  the data as listed    Latest Ref Rng & Units 07/05/2023   10:51 AM 06/11/2023    9:31 AM 05/21/2023    8:44 AM  CMP  Glucose 70 - 99 mg/dL 147  829  562   BUN 8 - 23 mg/dL 10  10  8    Creatinine 0.44 - 1.00 mg/dL 1.30  8.65  7.84   Sodium 135 - 145 mmol/L 138  139  137   Potassium 3.5 - 5.1 mmol/L 4.1  3.7  4.1   Chloride 98 - 111 mmol/L 107  109  107   CO2 22 - 32 mmol/L 26  25  25    Calcium 8.9 - 10.3 mg/dL 8.7  8.7  8.8   Total Protein 6.5 - 8.1 g/dL 5.9  5.5  5.7   Total Bilirubin <1.2 mg/dL 1.3  1.5  1.5   Alkaline Phos 38 - 126 U/L 122  125  144   AST 15 - 41 U/L 38  36  43   ALT 0 - 44 U/L 18  17  21      Lab Results  Component Value Date   WBC 4.3 07/23/2023   HGB 12.0 07/23/2023   HCT 33.7 (L) 07/23/2023   MCV 107.3 (H) 07/23/2023   PLT 102 (L) 07/23/2023   NEUTROABS 3.5 07/23/2023    ASSESSMENT & PLAN:  Malignant neoplasm of lower-inner quadrant of right breast of female, estrogen receptor positive (HCC) 07/03/2019: Right breast LIQ T1CN0 stage Ia grade 2 IDC ER 95% PR negative HER2 negative Ki-67 5% 10/04/2019: Right lumpectomy: T2N1 stage IIb grade 2 IDC positive lymphovascular invasion 3 lymph nodes removed 1 with macro N1 micrometastatic disease 10/04/2019: MammaPrint: Low risk 01/05/2020: Completed adjuvant radiation 08/04/2019: Genetics: Negative November 2020: Tamoxifen ------------------------------------------------------------------- 09/28/2022: Patient presented with platelets 65 INR 1.9 elevated LFTs and hypercalcemia  CT CAP 09/29/2022: Mediastinal and hilar lymphadenopathy suggestive of metastatic disease.  Patchy tree-in-bud nodularity in the lungs, partially necrotic mass right hepatic lobe 12 cm, also 2.5 cm lesion lytic bone metastases in spine pelvis and left scapula   Treatment plan: Liver biopsy: Poorly diff cancer ER 30-40%, PR:0%, Her 2 Neg Verzinio with AI started 10/24/2022 discontinued May 2024 due to progression in the liver 3.  Bone Marrow  Biopsy: 10/13/22: Positive for met breast cancer:   4.  Hospitalization 01/11/2023-01/21/2023: Severe jaundice due to progression in the liver 5.  Perative radiation to the spine  -------------------------------------------------------------------------------------------------------------------- Current treatment: Enhertu cycle 10 (started 01/15/2023) Enhertu toxicities: None   PET CT scan 06/11/2023: No evidence of recurrent/metastatic disease.  Cirrhotic/pseudocirrhosis liver, treated bone metastases   Return to clinic every 3 weeks for Enhertu   Follow-up Last PET scan was on October 11th, 2024. -Plan for next  PET scan after the new year, potentially extending intervals to every four months thereafter.       No orders of the defined types were placed in this encounter.  The patient has a good understanding of the overall plan. she agrees with it. she will call with any problems that may develop before the next visit here. Total time spent: 30 mins including face to face time and time spent for planning, charting and co-ordination of care   Tamsen Meek, MD 07/23/23

## 2023-07-23 NOTE — Assessment & Plan Note (Signed)
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  Patchy tree-in-bud nodularity in the lungs, partially necrotic mass right hepatic lobe 12 cm, also 2.5 cm lesion lytic bone metastases in spine pelvis and left scapula   Treatment plan: Liver biopsy: Poorly diff cancer ER 30-40%, PR:0%, Her 2 Neg Verzinio with AI started 10/24/2022 discontinued May 2024 due to progression in the liver 3.  Bone Marrow Biopsy: 10/13/22: Positive for met breast cancer:   4.  Hospitalization 01/11/2023-01/21/2023: Severe jaundice due to progression in the liver 5.  Perative radiation to the spine  -------------------------------------------------------------------------------------------------------------------- Current treatment: Enhertu cycle 10 (started 01/15/2023) Enhertu toxicities: None   PET CT scan 06/11/2023: No evidence of recurrent/metastatic disease.  Cirrhotic/pseudocirrhosis liver, treated bone metastases   Return to clinic every 3 weeks for Enhertu

## 2023-07-23 NOTE — Patient Instructions (Signed)
[facility] - A DEPT OF [facility]  Discharge Instructions: Thank you for choosing [facility] to provide your oncology and hematology care.   If you have a lab appointment with the Cancer Center, please go directly to the Cancer Center and check in at the registration area.   Wear comfortable clothing and clothing appropriate for easy access to any Portacath or PICC line.   We strive to give you quality time with your provider. You may need to reschedule your appointment if you arrive late (15 or more minutes).  Arriving late affects you and other patients whose appointments are after yours.  Also, if you miss three or more appointments without notifying the office, you may be dismissed from the clinic at the provider's discretion.      For prescription refill requests, have your pharmacy contact our office and allow 72 hours for refills to be completed.    Today you received the following chemotherapy and/or immunotherapy agents: Fam-trastuzumab deruxtecan (Enhertu)      To help prevent nausea and vomiting after your treatment, we encourage you to take your nausea medication as directed.  BELOW ARE SYMPTOMS THAT SHOULD BE REPORTED IMMEDIATELY: *FEVER GREATER THAN 100.4 F (38 C) OR HIGHER *CHILLS OR SWEATING *NAUSEA AND VOMITING THAT IS NOT CONTROLLED WITH YOUR NAUSEA MEDICATION *UNUSUAL SHORTNESS OF BREATH *UNUSUAL BRUISING OR BLEEDING *URINARY PROBLEMS (pain or burning when urinating, or frequent urination) *BOWEL PROBLEMS (unusual diarrhea, constipation, pain near the anus) TENDERNESS IN MOUTH AND THROAT WITH OR WITHOUT PRESENCE OF ULCERS (sore throat, sores in mouth, or a toothache) UNUSUAL RASH, SWELLING OR PAIN  UNUSUAL VAGINAL DISCHARGE OR ITCHING   Items with * indicate a potential emergency and should be followed up as soon as possible or go to the Emergency Department if any problems should occur.  Please show the CHEMOTHERAPY  ALERT CARD or IMMUNOTHERAPY ALERT CARD at check-in to the Emergency Department and triage nurse.  Should you have questions after your visit or need to cancel or reschedule your appointment, please contact [facility] - A DEPT OF [facility]  Dept: [phone]  and follow the prompts.  Office hours are 8:00 a.m. to 4:30 p.m. Monday - Friday. Please note that voicemails left after 4:00 p.m. may not be returned until the following business day.  We are closed weekends and major holidays. You have access to a nurse at all times for urgent questions. Please call the main number to the clinic Dept: [phone] and follow the prompts.   For any non-urgent questions, you may also contact your provider using MyChart. We now offer e-Visits for anyone 69 and older to request care online for non-urgent symptoms. For details visit [email or web address].   Also download the MyChart app! Go to the app store, search "MyChart", open the app, select [place], and log in with your MyChart username and password.

## 2023-07-27 ENCOUNTER — Ambulatory Visit: Payer: Medicare HMO | Admitting: Pulmonary Disease

## 2023-07-27 ENCOUNTER — Encounter: Payer: Self-pay | Admitting: Pulmonary Disease

## 2023-07-27 VITALS — BP 122/77 | HR 103 | Temp 98.2°F | Ht 65.0 in | Wt 156.0 lb

## 2023-07-27 DIAGNOSIS — I272 Pulmonary hypertension, unspecified: Secondary | ICD-10-CM

## 2023-07-27 DIAGNOSIS — J9601 Acute respiratory failure with hypoxia: Secondary | ICD-10-CM

## 2023-07-27 NOTE — Patient Instructions (Signed)
Nice to see you again  Echocardiogram in October is [facility] better compared to May - this is great news  Return to clinic as needed

## 2023-07-27 NOTE — Progress Notes (Signed)
@Patient  ID: Theresa [name], female    DOB: 1956/06/19, 67 y.o.   MRN: [id number]  Chief Complaint  Patient presents with   Follow-up    Referring provider: Carylon Perches, MD  HPI:   67 y.o. woman with metastatic breast cancer to liver and bone whom we are seeing for evaluation of hypoxemic respiratory failure and presumed pulmonary hypertension.  Most recent oncology note x 3 reviewed.  Returns for routine follow-up.  Doing well.  No breathing issues.  Much improved over time.  Reviewed TTE 05/2017 that shows normalization of PASP.  Markedly improved images compared to significant pressure volume overload in 12/2022 when admitted in the hospital with pneumonia.  Also with signs of ascites on portal hypertension and portal pulmonary hypertension.  This is improved with treatment of her cancer.  Feels much better.  Recently had an infusion and feels rundown for a few days.  Today is one of the worst days, steroids with infusion are wearing off.  But overall doing well.  HPI initial visit: Patient was hospitalized 12/2022.  With hypoxemia.  CT of the chest without PE but did demonstrate scattered patchy groundglass opacities concerning for multifocal pneumonia.  Placed on antibiotics.  Eventually discharged on oxygen.  Echocardiogram at that time demonstrated concern for elevated right-sided pressures with preserved RV function.  RA size within normal limits.  Minimal valve disease and normal EF.  Every time imaging is demonstrated ascites.  She underwent paracentesis 03/2023.  Cytology negative.  Unfortunate chemistry sent to evaluate SAAG, etiology of ascites.  Over the last few months he is gradually improved.  Was on oxygen.  Hypoxemia improved no longer using.  Back to riding horses.  More active.  Overall dyspnea much improved.  Imaging does demonstrate improved liver findings.  We discussed at length that-the treatment of cancer that the inflammatory milieu and effects of liver disease on  pulmonary hypertension etc. have improved with cancer treatment.  There is a chance that there is underlying cirrhosis for other reasons and this can lead to portal hypertension and portal pulmonary hypertension.  Given how much she is improved I suspect echocardiogram will also look improved.  But we need to prepared to switch gears and treat differently if needed based on results.  Discussed role and rationale for repeat echocardiogram now versus the next couple months.  After shared decision making agreed to pursue echocardiogram now.   Questionaires / Pulmonary Flowsheets:   ACT:      No data to display          MMRC:     No data to display          Epworth:      No data to display          Tests:   FENO:  No results found for: "NITRICOXIDE"  PFT:     No data to display          WALK:     02/11/2023   12:10 PM  SIX MIN WALK  Supplimental Oxygen during Test? (L/min) No  Tech Comments: Walked 1/2 lap  on RA desat. to 88%, placed on POC sat. 91% 2L, HR 105. No complaints.    Imaging: Personally reviewed and as per EMR and discussion in this note No results found.  Lab Results: Personally reviewed CBC    Component Value Date/Time   WBC 4.3 07/23/2023 1007   WBC 4.0 01/21/2023 0625   RBC 3.14 (L) 07/23/2023 1007  HGB 12.0 07/23/2023 1007   HGB 10.8 (L) 01/19/2020 0911   HGB 12.8 03/15/2013 1434   HCT 33.7 (L) 07/23/2023 1007   HCT 32.8 (L) 01/19/2020 0911   PLT 102 (L) 07/23/2023 1007   PLT 216 01/19/2020 0911   MCV 107.3 (H) 07/23/2023 1007   MCV 94 01/19/2020 0911   MCH 38.2 (H) 07/23/2023 1007   MCHC 35.6 07/23/2023 1007   RDW 16.3 (H) 07/23/2023 1007   RDW 12.5 01/19/2020 0911   LYMPHSABS 0.5 (L) 07/23/2023 1007   LYMPHSABS 0.8 01/19/2020 0911   MONOABS 0.3 07/23/2023 1007   EOSABS 0.0 07/23/2023 1007   EOSABS 0.2 01/19/2020 0911   BASOSABS 0.0 07/23/2023 1007   BASOSABS 0.0 01/19/2020 0911    BMET    Component Value Date/Time    NA 137 07/23/2023 1007   K 4.3 07/23/2023 1007   CL 104 07/23/2023 1007   CO2 27 07/23/2023 1007   GLUCOSE 148 (H) 07/23/2023 1007   BUN 10 07/23/2023 1007   CREATININE 0.63 07/23/2023 1007   CALCIUM 9.4 07/23/2023 1007   GFRNONAA >60 07/23/2023 1007   GFRAA >60 03/08/2020 1001    BNP    Component Value Date/Time   BNP 446.5 (H) 01/15/2023 0554    ProBNP    Component Value Date/Time   PROBNP 52.0 02/11/2023 1212    Specialty Problems       Pulmonary Problems   Hypoxic respiratory failure (HCC)   PNA (pneumonia)   Pneumonia    Allergies  Allergen Reactions   Metronidazole Other (See Comments)    C-Diff C-Diff C-Diff   Other Other (See Comments) and Rash    Dermabond - redness and burning, blistering Dermabond - redness and burning, blistering Dermabond - redness and burning, blistering "burning" skin   Tape Rash    "burning" skin "burning" skin    Immunization History  Administered Date(s) Administered   Influenza, Quadrivalent, Recombinant, Inj, Pf 06/23/2019   Moderna Sars-Covid-2 Vaccination 09/12/2020    Past Medical History:  Diagnosis Date   Breast cancer, right (HCC)    Contact lens/glasses fitting    wears contacts or glasses   Diarrhea    --post gallbladder surgery   Family history of bone cancer    Family history of brain cancer    Family history of stomach cancer    Family history of uterine cancer    Fibroids    History of radiation therapy    Hypertension    No pertinent past medical history    Personal history of radiation therapy    PONV (postoperative nausea and vomiting)    Right ovarian cyst    Vertigo     Tobacco History: Social History   Tobacco Use  Smoking Status Former   Current packs/day: 0.00   Types: Cigarettes   Quit date: 03/15/1996   Years since quitting: 27.3  Smokeless Tobacco Never   Counseling given: Not Answered   Continue to not smoke  Outpatient Encounter Medications as of 07/27/2023   Medication Sig   dexamethasone (DECADRON) 4 MG tablet Take 1 tablet (4 mg total) by mouth daily. Take 1 tablet day chemotherapy 1 tablet 2 days after chemo with food   docusate sodium (COLACE) 100 MG capsule Take 1 capsule (100 mg total) by mouth daily.   furosemide (LASIX) 20 MG tablet TAKE 1 TABLET BY MOUTH EVERY DAY WITH BREAKFAST   LORazepam (ATIVAN) 0.5 MG tablet Take 1 tablet (0.5 mg total) by mouth every 8 (  eight) hours as needed. for anxiety   omeprazole (PRILOSEC) 20 MG capsule TAKE 1 CAPSULE BY MOUTH 2 (TWO) TIMES DAILY BEFORE A MEAL.   ondansetron (ZOFRAN) 8 MG tablet Take 1 tablet (8 mg total) by mouth every 8 (eight) hours as needed for nausea or vomiting.   spironolactone (ALDACTONE) 25 MG tablet TAKE 1 TABLET (25 MG TOTAL) BY MOUTH DAILY.   valsartan (DIOVAN) 40 MG tablet Take 40 mg by mouth at bedtime.   venlafaxine XR (EFFEXOR-XR) 37.5 MG 24 hr capsule Take 1 capsule (37.5 mg total) by mouth daily.   No facility-administered encounter medications on file as of 07/27/2023.     Review of Systems  Review of Systems  N/a Physical Exam  BP 122/77   Pulse (!) 103   Temp 98.2 F (36.8 C) (Oral)   Ht 5\' 5"  (1.651 m)   Wt 156 lb (70.8 kg)   LMP 09/01/2007 (Exact Date)   SpO2 97%   BMI 25.96 kg/m   Wt Readings from Last 5 Encounters:  07/27/23 156 lb (70.8 kg)  07/23/23 152 lb 12.8 oz (69.3 kg)  07/05/23 153 lb 11.2 oz (69.7 kg)  06/11/23 151 lb 6.4 oz (68.7 kg)  05/21/23 145 lb 12.8 oz (66.1 kg)    BMI Readings from Last 5 Encounters:  07/27/23 25.96 kg/m  07/23/23 25.43 kg/m  07/05/23 25.58 kg/m  06/11/23 25.19 kg/m  05/21/23 24.26 kg/m     Physical Exam General: Sitting in chair, no acute distress Eyes: EOMI, no icterus Neck: Supple, no JVP Pulmonary: Clear, normal work of breathing Cardiovascular: Warm, no edema Abdomen: Nondistended, vessels present MSK: No synovitis, no joint effusion, right-sided chest port well-seated Neuro: Normal gait,  no weakness Psych: Normal mood, full affect  Assessment & Plan:   Acute hypoxemic respiratory failure: Now resolved.  Saturating high 90s on room air.  Poss related to acute portal hypertension and portal pulmonary hypertension in the setting of significant liver metastasis.  Also possibly slowly improved pneumonia from 01/18/2023 although chest images are relatively unimpressive.  No longer requiring oxygen.  All improved.  Presumed pulmonary hypertension (resolved): Based on TTE 12/2022.  Acutely ill with pneumonia and hypoxemic.  Hypoxemia is improved over time.  Suspect all related to hepatic congestion and cancer infiltration given improvement in echocardiogram with improvement in cancer burden with ongoing treatment.  Normalization of estimated PA pressures noted on repeat echocardiogram 06/2023.  No further evaluation or workup at this time.  If symptoms were to worsen in the future could always reevaluate.   Return if symptoms worsen or fail to improve, for f/u Dr. Judeth Horn.   Karren Burly, MD 07/27/2023

## 2023-08-12 MED FILL — Fosaprepitant Dimeglumine For IV Infusion 150 MG (Base Eq): INTRAVENOUS | Qty: 5 | Status: AC

## 2023-08-13 ENCOUNTER — Inpatient Hospital Stay: Payer: Medicare HMO

## 2023-08-13 ENCOUNTER — Inpatient Hospital Stay: Payer: Medicare HMO | Attending: Hematology and Oncology

## 2023-08-13 ENCOUNTER — Inpatient Hospital Stay: Payer: Medicare HMO | Admitting: Hematology and Oncology

## 2023-08-13 VITALS — BP 129/63 | HR 99 | Temp 98.4°F | Resp 18 | Ht 65.0 in | Wt 154.7 lb

## 2023-08-13 DIAGNOSIS — Z170 Estrogen receptor positive status [ER+]: Secondary | ICD-10-CM

## 2023-08-13 DIAGNOSIS — Z5112 Encounter for antineoplastic immunotherapy: Secondary | ICD-10-CM | POA: Diagnosis present

## 2023-08-13 DIAGNOSIS — R180 Malignant ascites: Secondary | ICD-10-CM

## 2023-08-13 DIAGNOSIS — C787 Secondary malignant neoplasm of liver and intrahepatic bile duct: Secondary | ICD-10-CM | POA: Diagnosis not present

## 2023-08-13 DIAGNOSIS — C7951 Secondary malignant neoplasm of bone: Secondary | ICD-10-CM

## 2023-08-13 DIAGNOSIS — C50311 Malignant neoplasm of lower-inner quadrant of right female breast: Secondary | ICD-10-CM | POA: Diagnosis present

## 2023-08-13 DIAGNOSIS — C419 Malignant neoplasm of bone and articular cartilage, unspecified: Secondary | ICD-10-CM

## 2023-08-13 DIAGNOSIS — Z79899 Other long term (current) drug therapy: Secondary | ICD-10-CM | POA: Insufficient documentation

## 2023-08-13 DIAGNOSIS — Z923 Personal history of irradiation: Secondary | ICD-10-CM | POA: Insufficient documentation

## 2023-08-13 LAB — CBC WITH DIFFERENTIAL (CANCER CENTER ONLY)
Abs Immature Granulocytes: 0.01 10*3/uL (ref 0.00–0.07)
Basophils Absolute: 0 10*3/uL (ref 0.0–0.1)
Basophils Relative: 1 %
Eosinophils Absolute: 0.1 10*3/uL (ref 0.0–0.5)
Eosinophils Relative: 3 %
HCT: 33.2 % — ABNORMAL LOW (ref 36.0–46.0)
Hemoglobin: 11.7 g/dL — ABNORMAL LOW (ref 12.0–15.0)
Immature Granulocytes: 0 %
Lymphocytes Relative: 8 %
Lymphs Abs: 0.4 10*3/uL — ABNORMAL LOW (ref 0.7–4.0)
MCH: 37.7 pg — ABNORMAL HIGH (ref 26.0–34.0)
MCHC: 35.2 g/dL (ref 30.0–36.0)
MCV: 107.1 fL — ABNORMAL HIGH (ref 80.0–100.0)
Monocytes Absolute: 0.3 10*3/uL (ref 0.1–1.0)
Monocytes Relative: 6 %
Neutro Abs: 4.3 10*3/uL (ref 1.7–7.7)
Neutrophils Relative %: 82 %
Platelet Count: 122 10*3/uL — ABNORMAL LOW (ref 150–400)
RBC: 3.1 MIL/uL — ABNORMAL LOW (ref 3.87–5.11)
RDW: 15.9 % — ABNORMAL HIGH (ref 11.5–15.5)
WBC Count: 5.2 10*3/uL (ref 4.0–10.5)
nRBC: 0 % (ref 0.0–0.2)

## 2023-08-13 LAB — CMP (CANCER CENTER ONLY)
ALT: 15 U/L (ref 0–44)
AST: 35 U/L (ref 15–41)
Albumin: 3.7 g/dL (ref 3.5–5.0)
Alkaline Phosphatase: 128 U/L — ABNORMAL HIGH (ref 38–126)
Anion gap: 6 (ref 5–15)
BUN: 7 mg/dL — ABNORMAL LOW (ref 8–23)
CO2: 24 mmol/L (ref 22–32)
Calcium: 8.9 mg/dL (ref 8.9–10.3)
Chloride: 104 mmol/L (ref 98–111)
Creatinine: 0.58 mg/dL (ref 0.44–1.00)
GFR, Estimated: >60 mL/min (ref >60)
Glucose, Bld: 169 mg/dL — ABNORMAL HIGH (ref 70–99)
Potassium: 4.2 mmol/L (ref 3.5–5.1)
Sodium: 134 mmol/L — ABNORMAL LOW (ref 135–145)
Total Bilirubin: 1.3 mg/dL — ABNORMAL HIGH (ref <1.2)
Total Protein: 6.1 g/dL — ABNORMAL LOW (ref 6.5–8.1)

## 2023-08-13 MED ORDER — FAM-TRASTUZUMAB DERUXTECAN-NXKI CHEMO 100 MG IV SOLR
4.4000 mg/kg | Freq: Once | INTRAVENOUS | Status: AC
  Administered 2023-08-13: 300 mg via INTRAVENOUS
  Filled 2023-08-13: qty 15

## 2023-08-13 MED ORDER — DIPHENHYDRAMINE HCL 25 MG PO CAPS
50.0000 mg | ORAL_CAPSULE | Freq: Once | ORAL | Status: AC
Start: 2023-08-13 — End: 2023-08-13
  Administered 2023-08-13: 50 mg via ORAL
  Filled 2023-08-13: qty 2

## 2023-08-13 MED ORDER — SODIUM CHLORIDE 0.9% FLUSH
10.0000 mL | Freq: Once | INTRAVENOUS | Status: AC
  Administered 2023-08-13: 10 mL

## 2023-08-13 MED ORDER — ACETAMINOPHEN 325 MG PO TABS
650.0000 mg | ORAL_TABLET | Freq: Once | ORAL | Status: AC
Start: 2023-08-13 — End: 2023-08-13
  Administered 2023-08-13: 650 mg via ORAL
  Filled 2023-08-13: qty 2

## 2023-08-13 MED ORDER — SODIUM CHLORIDE 0.9% FLUSH
10.0000 mL | INTRAVENOUS | Status: DC | PRN
  Administered 2023-08-13: 10 mL

## 2023-08-13 MED ORDER — SODIUM CHLORIDE 0.9 % IV SOLN
150.0000 mg | Freq: Once | INTRAVENOUS | Status: AC
  Administered 2023-08-13: 150 mg via INTRAVENOUS
  Filled 2023-08-13: qty 150

## 2023-08-13 MED ORDER — HEPARIN SOD (PORK) LOCK FLUSH 100 UNIT/ML IV SOLN
500.0000 [IU] | Freq: Once | INTRAVENOUS | Status: AC | PRN
  Administered 2023-08-13: 500 [IU]

## 2023-08-13 MED ORDER — DEXTROSE 5 % IV SOLN: Freq: Once | INTRAVENOUS | Status: AC

## 2023-08-13 MED ORDER — PALONOSETRON HCL INJECTION 0.25 MG/5ML
0.2500 mg | Freq: Once | INTRAVENOUS | Status: AC
  Administered 2023-08-13: 0.25 mg via INTRAVENOUS
  Filled 2023-08-13: qty 5

## 2023-08-13 MED ORDER — DEXAMETHASONE SODIUM PHOSPHATE 10 MG/ML IJ SOLN
10.0000 mg | Freq: Once | INTRAMUSCULAR | Status: AC
  Administered 2023-08-13: 10 mg via INTRAVENOUS
  Filled 2023-08-13: qty 1

## 2023-08-13 NOTE — Assessment & Plan Note (Addendum)
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  Patchy tree-in-bud nodularity in the lungs, partially necrotic mass right hepatic lobe 12 cm, also 2.5 cm lesion lytic bone metastases in spine pelvis and left scapula   Treatment plan: Liver biopsy: Poorly diff cancer ER 30-40%, PR:0%, Her 2 Neg Verzinio with AI started 10/24/2022 discontinued May 2024 due to progression in the liver 3.  Bone Marrow Biopsy: 10/13/22: Positive for met breast cancer:   4.  Hospitalization 01/11/2023-01/21/2023: Severe jaundice due to progression in the liver 5.  Perative radiation to the spine  -------------------------------------------------------------------------------------------------------------------- Current treatment: Enhertu cycle 11 (started 01/15/2023) Enhertu toxicities: None   PET CT scan 06/11/2023: No evidence of recurrent/metastatic disease.  Cirrhotic/pseudocirrhosis liver, treated bone metastases Plan for next scan   Return to clinic every 3 weeks for Enhertu

## 2023-08-13 NOTE — Progress Notes (Signed)
Patient Care Team: Carylon Perches, MD as PCP - General (Internal Medicine) Almond Lint, MD as Consulting Physician (General Surgery) Lonie Peak, MD as Attending Physician (Radiation Oncology) Patton Salles, MD as Consulting Physician (Obstetrics and Gynecology) Venancio Poisson, MD as Consulting Physician (Dermatology) Crist Fat, MD as Consulting Physician (Urology) Charna Elizabeth, MD as Consulting Physician (Gastroenterology) Sheral Apley, MD as Attending Physician (Orthopedic Surgery) Serena Croissant, MD as Consulting Physician (Hematology and Oncology)  DIAGNOSIS:  Encounter Diagnoses  Name Primary?   Malignant neoplasm of lower-inner quadrant of right breast of female, estrogen receptor positive (HCC) Yes   Cancer, metastatic to bone (HCC)    Malignant ascites     SUMMARY OF ONCOLOGIC HISTORY: Oncology History  Malignant neoplasm of lower-inner quadrant of right breast of female, estrogen receptor positive (HCC)  07/03/2019 Initial Diagnosis   right lower inner quadrant biopsy, for a clinicaly multiple T1c N0, stage IA invasive ductal carcinoma, grade 2, E-cadherin positive, strongly estrogen receptor positive but progesterone receptor negative and HER-2 not amplified, with an MIB-1 of 5%             (a) breast MRI 08/07/2019 showed 2 additional suspicious areas in the right breast and 1 in the left breast              (b) biopsy of all 3 areas 08/21/2019 showed no malignancy   07/26/2019 -  Anti-estrogen oral therapy   tamoxifen started neoadjuvantly 07/26/2019; to be continued for 10 years.  S/p TAH/BSO on 01/16/2020 with benign pathology   08/04/2019 Genetic Testing   Negative genetic testing:  No pathogenic variants detected on the [facility] Breast Cancer STAT panel or the Common Hereditary Cancers panel. The report date is 08/04/2019.  The STAT Breast cancer panel offered by [facility] includes sequencing and rearrangement analysis for the following 9 genes:   ATM, BRCA1, BRCA2, CDH1, CHEK2, PALB2, PTEN, STK11 and TP53.  The Common Hereditary Cancers Panel offered by [facility] includes sequencing and/or deletion duplication testing of the following 48 genes: APC, ATM, AXIN2, BARD1, BMPR1A, BRCA1, BRCA2, BRIP1, CDH1, CDK4, CDKN2A (p14ARF), CDKN2A (p16INK4a), CHEK2, CTNNA1, DICER1, EPCAM (Deletion/duplication testing only), GREM1 (promoter region deletion/duplication testing only), KIT, MEN1, MLH1, MSH2, MSH3, MSH6, MUTYH, NBN, NF1, NHTL1, PALB2, PDGFRA, PMS2, POLD1, POLE, PTEN, RAD50, RAD51C, RAD51D, RNF43, SDHB, SDHC, SDHD, SMAD4, SMARCA4. STK11, TP53, TSC1, TSC2, and VHL.  The following genes were evaluated for sequence changes only: SDHA and HOXB13 c.251G>A variant only.    10/04/2019 Cancer Staging   Staging form: Breast, AJCC 8th Edition - Pathologic stage from 10/04/2019: Stage IIB (pT2, pN1a(sn), cM0, G2, ER+, PR-, HER2-) - Signed by Loa Socks, NP on 10/18/2019   10/04/2019 Surgery    right lumpectomy and sentinel lymph node sampling 10/04/2019 showed a pT2 pN1, stage IIB invasive ductal carcinoma, grade 2, with positive lymphovascular invasion and a positive superior margin             (a) 3 sentinel lymph nodes removed, one with macro metastatic deposit, 1 with a micrometastatic deposit             (b) additional surgery for margin clearance   10/04/2019 Miscellaneous    MammaPrint on the 10/04/2019 sample shows a low risk luminal A tumor predicting a 5-year metastasis free survival of 96% without chemotherapy, and a chemotherapy benefit of less than 1.5%    11/27/2019 - 01/05/2020 Radiation Therapy    Site Technique Total Dose (Gy) Dose per Fx (  Gy) Completed Fx Beam Energies  Breast, Right: Breast_Rt 3D 50/50 2 25/25 6X, 10X  Breast, Right: Breast_Rt_SCV_PAB 3D 50/50 2 25/25 6X, 10X  Breast, Right: Breast_Rt_Bst 3D 10/10 2 5/5 6X, 10X     09/29/2022 Imaging   IMPRESSION: 1. Surgical changes involving the right medial breast. No  obvious recurrent breast mass or chest wall mass. 2. Mediastinal and hilar lymphadenopathy suggesting metastatic disease. 3. Patchy tree-in-bud type nodularity in the lungs suggesting chronic inflammation or atypical infection such as MAC. No definite pulmonary metastatic nodules. 4. Large partially necrotic mass involving most of the right hepatic lobe and a 2.5 cm lesion in segment 3 of the liver consistent with metastatic disease. 5. 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   10/06/2022 Initial Biopsy   Liver biopsy: + for malignancy, metastatic poorly differentiated carcinoma, likely breast origin, biomarkers pending, insufficient tissue for molecular testing. (Resulted in EPIC on 10/13/2022)   10/07/2022 PET scan   PET scan on 10/07/2022 that showed hypermetabolic metastatic breast cancer with hypermetabolic mediastinal and hilar adenopathy, hypermetabolic hepatic metastatic disease and bone disease.  There were no findings for pulmonary metastatic disease.   10/21/2022 -  Anti-estrogen oral therapy   Verzinio and Letrozole   01/15/2023 -  Chemotherapy   Patient is on Treatment Plan : BREAST METASTATIC Fam-Trastuzumab Deruxtecan-nxki (Enhertu) (5.4) q21d     02/19/2023 Cancer Staging   Staging form: Breast, AJCC 8th Edition - Pathologic: Stage IV (pM1) - Signed by Loa Socks, NP on 02/19/2023   Cancer, metastatic to bone (HCC)  12/21/2022 Initial Diagnosis   Cancer, metastatic to bone (HCC)   01/15/2023 -  Chemotherapy   Patient is on Treatment Plan : BREAST METASTATIC Fam-Trastuzumab Deruxtecan-nxki (Enhertu) (5.4) q21d       CHIEF COMPLIANT: Follow-up on Enhertu  HISTORY OF PRESENT ILLNESS:   History of Present Illness   The patient, with a history of cancer, presents for a Enhertu. She reports feeling generally well, with the primary complaint being fatigue, which she attributes to her ongoing cancer treatment. She also  experiences back pain, which she describes as muscular rather than bone-related. The pain is not severe and is well-managed with Tylenol as needed. The patient is active and maintains a busy schedule, which sometimes leads to overexertion and increased fatigue.         ALLERGIES:  is allergic to metronidazole, other, and tape.  MEDICATIONS:  Current Outpatient Medications  Medication Sig Dispense Refill   dexamethasone (DECADRON) 4 MG tablet Take 1 tablet (4 mg total) by mouth daily. Take 1 tablet day chemotherapy 1 tablet 2 days after chemo with food 30 tablet 0   docusate sodium (COLACE) 100 MG capsule Take 1 capsule (100 mg total) by mouth daily. 30 capsule 6   furosemide (LASIX) 20 MG tablet TAKE 1 TABLET BY MOUTH EVERY DAY WITH BREAKFAST 90 tablet 1   LORazepam (ATIVAN) 0.5 MG tablet Take 1 tablet (0.5 mg total) by mouth every 8 (eight) hours as needed. for anxiety 30 tablet 3   omeprazole (PRILOSEC) 20 MG capsule TAKE 1 CAPSULE BY MOUTH 2 (TWO) TIMES DAILY BEFORE A MEAL. 180 capsule 3   ondansetron (ZOFRAN) 8 MG tablet Take 1 tablet (8 mg total) by mouth every 8 (eight) hours as needed for nausea or vomiting. 30 tablet 2   spironolactone (ALDACTONE) 25 MG tablet TAKE 1 TABLET (25 MG TOTAL) BY MOUTH DAILY. 90 tablet 1   valsartan (DIOVAN) 40  MG tablet Take 40 mg by mouth at bedtime.     venlafaxine XR (EFFEXOR-XR) 37.5 MG 24 hr capsule Take 1 capsule (37.5 mg total) by mouth daily. 90 capsule 4   No current facility-administered medications for this visit.   Facility-Administered Medications Ordered in Other Visits  Medication Dose Route Frequency Provider Last Rate Last Admin   sodium chloride flush (NS) 0.9 % injection 10 mL  10 mL Intracatheter PRN Serena Croissant, MD   10 mL at 08/13/23 1252    PHYSICAL EXAMINATION: ECOG PERFORMANCE STATUS: 1 - Symptomatic but completely ambulatory  Vitals:   08/13/23 1016  BP: 129/63  Pulse: 99  Resp: 18  Temp: 98.4 F (36.9 C)  SpO2:  100%   Filed Weights   08/13/23 1016  Weight: 154 lb 11.2 oz (70.2 kg)     LABORATORY DATA:  I have reviewed the data as listed    Latest Ref Rng & Units 08/13/2023    9:54 AM 07/23/2023   10:07 AM 07/05/2023   10:51 AM  CMP  Glucose 70 - 99 mg/dL 409  811  914   BUN 8 - 23 mg/dL 7  10  10    Creatinine 0.44 - 1.00 mg/dL 7.82  9.56  2.13   Sodium 135 - 145 mmol/L 134  137  138   Potassium 3.5 - 5.1 mmol/L 4.2  4.3  4.1   Chloride 98 - 111 mmol/L 104  104  107   CO2 22 - 32 mmol/L 24  27  26    Calcium 8.9 - 10.3 mg/dL 8.9  9.4  8.7   Total Protein 6.5 - 8.1 g/dL 6.1  6.0  5.9   Total Bilirubin <1.2 mg/dL 1.3  1.2  1.3   Alkaline Phos 38 - 126 U/L 128  131  122   AST 15 - 41 U/L 35  42  38   ALT 0 - 44 U/L 15  21  18      Lab Results  Component Value Date   WBC 5.2 08/13/2023   HGB 11.7 (L) 08/13/2023   HCT 33.2 (L) 08/13/2023   MCV 107.1 (H) 08/13/2023   PLT 122 (L) 08/13/2023   NEUTROABS 4.3 08/13/2023    ASSESSMENT & PLAN:  Malignant neoplasm of lower-inner quadrant of right breast of female, estrogen receptor positive (HCC) 07/03/2019: Right breast LIQ T1CN0 stage Ia grade 2 IDC ER 95% PR negative HER2 negative Ki-67 5% 10/04/2019: Right lumpectomy: T2N1 stage IIb grade 2 IDC positive lymphovascular invasion 3 lymph nodes removed 1 with macro N1 micrometastatic disease 10/04/2019: MammaPrint: Low risk 01/05/2020: Completed adjuvant radiation 08/04/2019: Genetics: Negative November 2020: Tamoxifen ------------------------------------------------------------------- 09/28/2022: Patient presented with platelets 65 INR 1.9 elevated LFTs and hypercalcemia  CT CAP 09/29/2022: Mediastinal and hilar lymphadenopathy suggestive of metastatic disease.  Patchy tree-in-bud nodularity in the lungs, partially necrotic mass right hepatic lobe 12 cm, also 2.5 cm lesion lytic bone metastases in spine pelvis and left scapula   Treatment plan: Liver biopsy: Poorly diff cancer ER 30-40%, PR:0%,  Her 2 Neg Verzinio with AI started 10/24/2022 discontinued May 2024 due to progression in the liver 3.  Bone Marrow Biopsy: 10/13/22: Positive for met breast cancer:   4.  Hospitalization 01/11/2023-01/21/2023: Severe jaundice due to progression in the liver 5.  Perative radiation to the spine  -------------------------------------------------------------------------------------------------------------------- Current treatment: Enhertu cycle 11 (started 01/15/2023) Enhertu toxicities: None   PET CT scan 06/11/2023: No evidence of recurrent/metastatic disease.  Cirrhotic/pseudocirrhosis liver, treated bone metastases  Metastatic Breast Cancer Stable condition with no new symptoms. No need for mammogram as patient is undergoing PET scans. -Continue current treatment regimen. -Next PET scan scheduled for January 2024.  Musculoskeletal Pain Mild back pain, likely muscular. Managed with Tylenol as needed. -Continue current management strategy. -Encouraged gentle activity and rest.  General Health Maintenance / Followup Plans -Next treatment appointment scheduled for September 05, 2022. -Subsequent appointments every third Monday from that date. -Check pending lab results.          Orders Placed This Encounter  Procedures   NM PET Image Restag (PS) Skull Base To Thigh    Standing Status:   Future    Expected Date:   09/20/2023    Expiration Date:   08/12/2024    If indicated for the ordered procedure, I authorize the administration of a radiopharmaceutical per Radiology protocol:   Yes    Preferred imaging location?:       Release to patient:   Immediate   CA 27.29    Standing Status:   Standing    Number of Occurrences:   100    Expiration Date:   08/12/2024   CBC with Differential (Cancer Center Only)    Standing Status:   Future    Expected Date:   09/24/2023    Expiration Date:   09/23/2024   CMP (Cancer Center only)    Standing Status:   Future    Expected Date:    09/24/2023    Expiration Date:   09/23/2024   CBC with Differential (Cancer Center Only)    Standing Status:   Future    Expected Date:   10/15/2023    Expiration Date:   10/14/2024   CMP (Cancer Center only)    Standing Status:   Future    Expected Date:   10/15/2023    Expiration Date:   10/14/2024   CBC with Differential (Cancer Center Only)    Standing Status:   Future    Expected Date:   11/05/2023    Expiration Date:   11/04/2024   CMP (Cancer Center only)    Standing Status:   Future    Expected Date:   11/05/2023    Expiration Date:   11/04/2024   CBC with Differential (Cancer Center Only)    Standing Status:   Future    Expected Date:   11/26/2023    Expiration Date:   11/25/2024   CMP (Cancer Center only)    Standing Status:   Future    Expected Date:   11/26/2023    Expiration Date:   11/25/2024   CBC with Differential (Cancer Center Only)    Standing Status:   Future    Expected Date:   12/17/2023    Expiration Date:   12/16/2024   CMP (Cancer Center only)    Standing Status:   Future    Expected Date:   12/17/2023    Expiration Date:   12/16/2024   The patient has a good understanding of the overall plan. she agrees with it. she will call with any problems that may develop before the next visit here. Total time spent: 30 mins including face to face time and time spent for planning, charting and co-ordination of care   Tamsen Meek, MD 08/13/23

## 2023-08-13 NOTE — Patient Instructions (Signed)
 [facility] - A DEPT OF [facility]  Discharge Instructions: Thank you for choosing [facility] to provide your oncology and hematology care.   If you have a lab appointment with the Cancer Center, please go directly to the Cancer Center and check in at the registration area.   Wear comfortable clothing and clothing appropriate for easy access to any Portacath or PICC line.   We strive to give you quality time with your provider. You may need to reschedule your appointment if you arrive late (15 or more minutes).  Arriving late affects you and other patients whose appointments are after yours.  Also, if you miss three or more appointments without notifying the office, you may be dismissed from the clinic at the provider's discretion.      For prescription refill requests, have your pharmacy contact our office and allow 72 hours for refills to be completed.    Today you received the following chemotherapy and/or immunotherapy agents: Enhertu      To help prevent nausea and vomiting after your treatment, we encourage you to take your nausea medication as directed.  BELOW ARE SYMPTOMS THAT SHOULD BE REPORTED IMMEDIATELY: *FEVER GREATER THAN 100.4 F (38 C) OR HIGHER *CHILLS OR SWEATING *NAUSEA AND VOMITING THAT IS NOT CONTROLLED WITH YOUR NAUSEA MEDICATION *UNUSUAL SHORTNESS OF BREATH *UNUSUAL BRUISING OR BLEEDING *URINARY PROBLEMS (pain or burning when urinating, or frequent urination) *BOWEL PROBLEMS (unusual diarrhea, constipation, pain near the anus) TENDERNESS IN MOUTH AND THROAT WITH OR WITHOUT PRESENCE OF ULCERS (sore throat, sores in mouth, or a toothache) UNUSUAL RASH, SWELLING OR PAIN  UNUSUAL VAGINAL DISCHARGE OR ITCHING   Items with * indicate a potential emergency and should be followed up as soon as possible or go to the Emergency Department if any problems should occur.  Please show the CHEMOTHERAPY ALERT CARD or IMMUNOTHERAPY  ALERT CARD at check-in to the Emergency Department and triage nurse.  Should you have questions after your visit or need to cancel or reschedule your appointment, please contact [facility] - A DEPT OF [facility]  Dept: [phone]  and follow the prompts.  Office hours are 8:00 a.m. to 4:30 p.m. Monday - Friday. Please note that voicemails left after 4:00 p.m. may not be returned until the following business day.  We are closed weekends and major holidays. You have access to a nurse at all times for urgent questions. Please call the main number to the clinic Dept: [phone] and follow the prompts.   For any non-urgent questions, you may also contact your provider using MyChart. We now offer e-Visits for anyone 70 and older to request care online for non-urgent symptoms. For details visit [email or web address].   Also download the MyChart app! Go to the app store, search "MyChart", open the app, select [place], and log in with your MyChart username and password.

## 2023-08-13 NOTE — Addendum Note (Signed)
Addended by: Drusilla Kanner on: 08/13/2023 01:17 PM   Modules accepted: Orders

## 2023-09-01 ENCOUNTER — Encounter: Payer: Self-pay | Admitting: Hematology and Oncology

## 2023-09-01 ENCOUNTER — Other Ambulatory Visit: Payer: Self-pay

## 2023-09-02 ENCOUNTER — Other Ambulatory Visit: Payer: Self-pay | Admitting: *Deleted

## 2023-09-02 ENCOUNTER — Inpatient Hospital Stay: Payer: Medicare HMO | Attending: Hematology and Oncology | Admitting: Physician Assistant

## 2023-09-02 ENCOUNTER — Inpatient Hospital Stay: Payer: Medicare HMO

## 2023-09-02 ENCOUNTER — Emergency Department (HOSPITAL_COMMUNITY): Payer: Medicare HMO

## 2023-09-02 ENCOUNTER — Inpatient Hospital Stay (HOSPITAL_COMMUNITY)
Admission: EM | Admit: 2023-09-02 | Discharge: 2023-09-07 | DRG: 189 | Disposition: A | Discharge status: Home / Self Care | Payer: Medicare HMO | Source: Ambulatory Visit | Attending: Internal Medicine | Admitting: Internal Medicine

## 2023-09-02 ENCOUNTER — Ambulatory Visit (HOSPITAL_COMMUNITY)
Admission: RE | Admit: 2023-09-02 | Discharge: 2023-09-02 | Disposition: A | Discharge status: Home / Self Care | Payer: Medicare HMO | Source: Ambulatory Visit | Attending: Physician Assistant | Admitting: Physician Assistant

## 2023-09-02 ENCOUNTER — Other Ambulatory Visit: Payer: Self-pay

## 2023-09-02 ENCOUNTER — Encounter: Payer: Self-pay | Admitting: Hematology and Oncology

## 2023-09-02 ENCOUNTER — Other Ambulatory Visit: Payer: Self-pay | Admitting: Hematology and Oncology

## 2023-09-02 VITALS — BP 123/73 | HR 120 | Temp 98.3°F | Resp 17 | Wt 154.7 lb

## 2023-09-02 DIAGNOSIS — Z79899 Other long term (current) drug therapy: Secondary | ICD-10-CM

## 2023-09-02 DIAGNOSIS — Z923 Personal history of irradiation: Secondary | ICD-10-CM | POA: Insufficient documentation

## 2023-09-02 DIAGNOSIS — R051 Acute cough: Secondary | ICD-10-CM | POA: Insufficient documentation

## 2023-09-02 DIAGNOSIS — Z8049 Family history of malignant neoplasm of other genital organs: Secondary | ICD-10-CM | POA: Diagnosis not present

## 2023-09-02 DIAGNOSIS — Z808 Family history of malignant neoplasm of other organs or systems: Secondary | ICD-10-CM

## 2023-09-02 DIAGNOSIS — Z90710 Acquired absence of both cervix and uterus: Secondary | ICD-10-CM

## 2023-09-02 DIAGNOSIS — Z800 Family history of malignant neoplasm of digestive organs: Secondary | ICD-10-CM

## 2023-09-02 DIAGNOSIS — D630 Anemia in neoplastic disease: Secondary | ICD-10-CM | POA: Diagnosis present

## 2023-09-02 DIAGNOSIS — D696 Thrombocytopenia, unspecified: Secondary | ICD-10-CM | POA: Diagnosis present

## 2023-09-02 DIAGNOSIS — F418 Other specified anxiety disorders: Secondary | ICD-10-CM | POA: Diagnosis present

## 2023-09-02 DIAGNOSIS — J189 Pneumonia, unspecified organism: Principal | ICD-10-CM | POA: Diagnosis present

## 2023-09-02 DIAGNOSIS — J9601 Acute respiratory failure with hypoxia: Principal | ICD-10-CM | POA: Diagnosis present

## 2023-09-02 DIAGNOSIS — Z1152 Encounter for screening for COVID-19: Secondary | ICD-10-CM

## 2023-09-02 DIAGNOSIS — Z87891 Personal history of nicotine dependence: Secondary | ICD-10-CM

## 2023-09-02 DIAGNOSIS — Z170 Estrogen receptor positive status [ER+]: Secondary | ICD-10-CM

## 2023-09-02 DIAGNOSIS — C50311 Malignant neoplasm of lower-inner quadrant of right female breast: Secondary | ICD-10-CM | POA: Diagnosis present

## 2023-09-02 DIAGNOSIS — Z888 Allergy status to other drugs, medicaments and biological substances status: Secondary | ICD-10-CM | POA: Diagnosis not present

## 2023-09-02 DIAGNOSIS — R0902 Hypoxemia: Secondary | ICD-10-CM

## 2023-09-02 DIAGNOSIS — C787 Secondary malignant neoplasm of liver and intrahepatic bile duct: Secondary | ICD-10-CM | POA: Diagnosis present

## 2023-09-02 DIAGNOSIS — D649 Anemia, unspecified: Secondary | ICD-10-CM | POA: Insufficient documentation

## 2023-09-02 DIAGNOSIS — C7951 Secondary malignant neoplasm of bone: Secondary | ICD-10-CM | POA: Insufficient documentation

## 2023-09-02 DIAGNOSIS — Z91048 Other nonmedicinal substance allergy status: Secondary | ICD-10-CM | POA: Diagnosis not present

## 2023-09-02 DIAGNOSIS — I10 Essential (primary) hypertension: Secondary | ICD-10-CM | POA: Diagnosis present

## 2023-09-02 DIAGNOSIS — Z8249 Family history of ischemic heart disease and other diseases of the circulatory system: Secondary | ICD-10-CM

## 2023-09-02 DIAGNOSIS — Z853 Personal history of malignant neoplasm of breast: Secondary | ICD-10-CM

## 2023-09-02 DIAGNOSIS — Z818 Family history of other mental and behavioral disorders: Secondary | ICD-10-CM | POA: Diagnosis not present

## 2023-09-02 DIAGNOSIS — Z8349 Family history of other endocrine, nutritional and metabolic diseases: Secondary | ICD-10-CM | POA: Diagnosis not present

## 2023-09-02 DIAGNOSIS — I272 Pulmonary hypertension, unspecified: Secondary | ICD-10-CM | POA: Diagnosis present

## 2023-09-02 DIAGNOSIS — Z5112 Encounter for antineoplastic immunotherapy: Secondary | ICD-10-CM | POA: Insufficient documentation

## 2023-09-02 DIAGNOSIS — R059 Cough, unspecified: Secondary | ICD-10-CM | POA: Insufficient documentation

## 2023-09-02 LAB — CBC WITH DIFFERENTIAL (CANCER CENTER ONLY)
Abs Immature Granulocytes: 0.02 10*3/uL (ref 0.00–0.07)
Basophils Absolute: 0 10*3/uL (ref 0.0–0.1)
Basophils Relative: 1 %
Eosinophils Absolute: 0.3 10*3/uL (ref 0.0–0.5)
Eosinophils Relative: 5 %
HCT: 35 % — ABNORMAL LOW (ref 36.0–46.0)
Hemoglobin: 12.2 g/dL (ref 12.0–15.0)
Immature Granulocytes: 0 %
Lymphocytes Relative: 14 %
Lymphs Abs: 1 10*3/uL (ref 0.7–4.0)
MCH: 36.2 pg — ABNORMAL HIGH (ref 26.0–34.0)
MCHC: 34.9 g/dL (ref 30.0–36.0)
MCV: 103.9 fL — ABNORMAL HIGH (ref 80.0–100.0)
Monocytes Absolute: 0.9 10*3/uL (ref 0.1–1.0)
Monocytes Relative: 12 %
Neutro Abs: 4.7 10*3/uL (ref 1.7–7.7)
Neutrophils Relative %: 68 %
Platelet Count: 123 10*3/uL — ABNORMAL LOW (ref 150–400)
RBC: 3.37 MIL/uL — ABNORMAL LOW (ref 3.87–5.11)
RDW: 16.4 % — ABNORMAL HIGH (ref 11.5–15.5)
WBC Count: 6.9 10*3/uL (ref 4.0–10.5)
nRBC: 0 % (ref 0.0–0.2)

## 2023-09-02 LAB — CBC WITH DIFFERENTIAL/PLATELET
Abs Immature Granulocytes: 0.02 10*3/uL (ref 0.00–0.07)
Basophils Absolute: 0 10*3/uL (ref 0.0–0.1)
Basophils Relative: 1 %
Eosinophils Absolute: 0.3 10*3/uL (ref 0.0–0.5)
Eosinophils Relative: 5 %
HCT: 36.4 % (ref 36.0–46.0)
Hemoglobin: 12.2 g/dL (ref 12.0–15.0)
Immature Granulocytes: 0 %
Lymphocytes Relative: 15 %
Lymphs Abs: 0.8 10*3/uL (ref 0.7–4.0)
MCH: 36.6 pg — ABNORMAL HIGH (ref 26.0–34.0)
MCHC: 33.5 g/dL (ref 30.0–36.0)
MCV: 109.3 fL — ABNORMAL HIGH (ref 80.0–100.0)
Monocytes Absolute: 0.7 10*3/uL (ref 0.1–1.0)
Monocytes Relative: 12 %
Neutro Abs: 3.6 10*3/uL (ref 1.7–7.7)
Neutrophils Relative %: 67 %
Platelets: 117 10*3/uL — ABNORMAL LOW (ref 150–400)
RBC: 3.33 MIL/uL — ABNORMAL LOW (ref 3.87–5.11)
RDW: 16.9 % — ABNORMAL HIGH (ref 11.5–15.5)
WBC: 5.4 10*3/uL (ref 4.0–10.5)
nRBC: 0 % (ref 0.0–0.2)

## 2023-09-02 LAB — BASIC METABOLIC PANEL
Anion gap: 7 (ref 5–15)
BUN: 9 mg/dL (ref 8–23)
CO2: 22 mmol/L (ref 22–32)
Calcium: 8.7 mg/dL — ABNORMAL LOW (ref 8.9–10.3)
Chloride: 105 mmol/L (ref 98–111)
Creatinine, Ser: 0.53 mg/dL (ref 0.44–1.00)
GFR, Estimated: >60 mL/min (ref >60)
Glucose, Bld: 165 mg/dL — ABNORMAL HIGH (ref 70–99)
Potassium: 3.5 mmol/L (ref 3.5–5.1)
Sodium: 134 mmol/L — ABNORMAL LOW (ref 135–145)

## 2023-09-02 LAB — CMP (CANCER CENTER ONLY)
ALT: 13 U/L (ref 0–44)
AST: 34 U/L (ref 15–41)
Albumin: 3.6 g/dL (ref 3.5–5.0)
Alkaline Phosphatase: 147 U/L — ABNORMAL HIGH (ref 38–126)
Anion gap: 7 (ref 5–15)
BUN: 10 mg/dL (ref 8–23)
CO2: 26 mmol/L (ref 22–32)
Calcium: 9.6 mg/dL (ref 8.9–10.3)
Chloride: 102 mmol/L (ref 98–111)
Creatinine: 0.76 mg/dL (ref 0.44–1.00)
GFR, Estimated: >60 mL/min (ref >60)
Glucose, Bld: 107 mg/dL — ABNORMAL HIGH (ref 70–99)
Potassium: 4.2 mmol/L (ref 3.5–5.1)
Sodium: 135 mmol/L (ref 135–145)
Total Bilirubin: 1.6 mg/dL — ABNORMAL HIGH (ref 0.0–1.2)
Total Protein: 6.1 g/dL — ABNORMAL LOW (ref 6.5–8.1)

## 2023-09-02 LAB — LACTIC ACID, PLASMA: Lactic Acid, Venous: 2.6 mmol/L (ref 0.5–1.9)

## 2023-09-02 LAB — RESP PANEL BY RT-PCR (RSV, FLU A&B, COVID)  RVPGX2
Influenza A by PCR: NEGATIVE
Influenza B by PCR: NEGATIVE
Resp Syncytial Virus by PCR: NEGATIVE
SARS Coronavirus 2 by RT PCR: NEGATIVE

## 2023-09-02 LAB — BRAIN NATRIURETIC PEPTIDE: B Natriuretic Peptide: 69.2 pg/mL (ref 0.0–100.0)

## 2023-09-02 MED ORDER — SODIUM CHLORIDE 0.9 % IV SOLN
500.0000 mg | INTRAVENOUS | Status: DC
  Administered 2023-09-02 – 2023-09-04 (×3): 500 mg via INTRAVENOUS
  Filled 2023-09-02 (×3): qty 5

## 2023-09-02 MED ORDER — ACETAMINOPHEN 325 MG PO TABS
650.0000 mg | ORAL_TABLET | Freq: Four times a day (QID) | ORAL | Status: DC | PRN
  Administered 2023-09-03: 650 mg via ORAL
  Filled 2023-09-02: qty 2

## 2023-09-02 MED ORDER — SODIUM CHLORIDE 0.9 % IV BOLUS
1000.0000 mL | Freq: Once | INTRAVENOUS | Status: AC
  Administered 2023-09-02: 1000 mL via INTRAVENOUS

## 2023-09-02 MED ORDER — LORAZEPAM 0.5 MG PO TABS
0.5000 mg | ORAL_TABLET | Freq: Three times a day (TID) | ORAL | Status: DC | PRN
  Administered 2023-09-03 – 2023-09-06 (×5): 0.5 mg via ORAL
  Filled 2023-09-02 (×5): qty 1

## 2023-09-02 MED ORDER — IOHEXOL 350 MG/ML SOLN
75.0000 mL | Freq: Once | INTRAVENOUS | Status: AC | PRN
  Administered 2023-09-02: 75 mL via INTRAVENOUS

## 2023-09-02 MED ORDER — SODIUM CHLORIDE 0.9 % IV SOLN
2.0000 g | INTRAVENOUS | Status: AC
  Administered 2023-09-02 – 2023-09-06 (×5): 2 g via INTRAVENOUS
  Filled 2023-09-02 (×6): qty 20

## 2023-09-02 MED ORDER — SENNOSIDES-DOCUSATE SODIUM 8.6-50 MG PO TABS: 1.0000 | ORAL_TABLET | Freq: Every evening | ORAL | Status: DC | PRN

## 2023-09-02 MED ORDER — IPRATROPIUM-ALBUTEROL 0.5-2.5 (3) MG/3ML IN SOLN
3.0000 mL | Freq: Once | RESPIRATORY_TRACT | Status: AC
  Administered 2023-09-02: 3 mL via RESPIRATORY_TRACT
  Filled 2023-09-02: qty 3

## 2023-09-02 MED ORDER — ACETAMINOPHEN 650 MG RE SUPP: 650.0000 mg | Freq: Four times a day (QID) | RECTAL | Status: DC | PRN

## 2023-09-02 MED ORDER — VENLAFAXINE HCL ER 37.5 MG PO CP24
37.5000 mg | ORAL_CAPSULE | Freq: Every day | ORAL | Status: DC
Start: 2023-09-03 — End: 2023-09-07
  Administered 2023-09-03 – 2023-09-07 (×5): 37.5 mg via ORAL
  Filled 2023-09-02 (×6): qty 1

## 2023-09-02 MED ORDER — LORAZEPAM 1 MG PO TABS: 1.0000 mg | ORAL_TABLET | Freq: Four times a day (QID) | ORAL | Status: DC | PRN

## 2023-09-02 MED ORDER — SODIUM CHLORIDE 0.9 % IV SOLN
1.0000 g | Freq: Once | INTRAVENOUS | Status: DC
  Filled 2023-09-02: qty 10

## 2023-09-02 MED ORDER — SODIUM CHLORIDE 0.9 % IV SOLN
500.0000 mg | Freq: Once | INTRAVENOUS | Status: DC
  Filled 2023-09-02: qty 5

## 2023-09-02 MED ORDER — CEFTRIAXONE SODIUM 2 G IJ SOLR: 2.0000 g | INTRAMUSCULAR | Status: DC

## 2023-09-02 MED ORDER — IPRATROPIUM-ALBUTEROL 0.5-2.5 (3) MG/3ML IN SOLN: 3.0000 mL | Freq: Once | RESPIRATORY_TRACT | Status: DC

## 2023-09-02 MED ORDER — IPRATROPIUM-ALBUTEROL 0.5-2.5 (3) MG/3ML IN SOLN
3.0000 mL | Freq: Four times a day (QID) | RESPIRATORY_TRACT | Status: DC | PRN
  Administered 2023-09-03: 3 mL via RESPIRATORY_TRACT
  Filled 2023-09-02: qty 3

## 2023-09-02 MED ORDER — ONDANSETRON HCL 4 MG/2ML IJ SOLN
4.0000 mg | Freq: Four times a day (QID) | INTRAMUSCULAR | Status: DC | PRN
  Administered 2023-09-02: 4 mg via INTRAVENOUS
  Filled 2023-09-02: qty 2

## 2023-09-02 MED ORDER — AZITHROMYCIN 500 MG IV SOLR: 500.0000 mg | INTRAVENOUS | Status: DC

## 2023-09-02 MED ORDER — ENOXAPARIN SODIUM 40 MG/0.4ML IJ SOSY
40.0000 mg | PREFILLED_SYRINGE | Freq: Every day | INTRAMUSCULAR | Status: DC
  Administered 2023-09-03 – 2023-09-06 (×4): 40 mg via SUBCUTANEOUS
  Filled 2023-09-02 (×5): qty 0.4

## 2023-09-02 MED ORDER — SODIUM CHLORIDE (PF) 0.9 % IJ SOLN
INTRAMUSCULAR | Status: AC
  Filled 2023-09-02: qty 50

## 2023-09-02 MED ORDER — ONDANSETRON HCL 4 MG PO TABS: 4.0000 mg | ORAL_TABLET | Freq: Four times a day (QID) | ORAL | Status: DC | PRN

## 2023-09-02 MED ORDER — GUAIFENESIN ER 600 MG PO TB12
600.0000 mg | ORAL_TABLET | Freq: Two times a day (BID) | ORAL | Status: DC
  Administered 2023-09-02 – 2023-09-07 (×10): 600 mg via ORAL
  Filled 2023-09-02 (×11): qty 1

## 2023-09-02 MED ORDER — SODIUM CHLORIDE 0.9 % IV SOLN: Freq: Once | INTRAVENOUS | Status: AC

## 2023-09-02 NOTE — ED Provider Notes (Signed)
 [place] EMERGENCY DEPARTMENT AT [facility] Provider Note  CSN: [id number] Arrival date & time: 09/02/23 1403  Chief Complaint(s) hypoxia  HPI Theresa [name] is a 68 y.o. female with past medical history as below, significant for metastatic breast cancer, hypertension, pulmonary hypertension, who presents to the ED with complaint of dyspnea She has been feeling unwell over the past week, intermittent cough, difficulty breathing.  Body aches.  Positive sick contacts with spouse.  She was seen at cancer center earlier today advised to come to the ER for evaluation given hypoxia / abnormal x-ray.  Does not wear home oxygen longer, she used home oxygen briefly after initial diagnosis of breast cancer.  Requiring 2 L nasal cannula to maintain O2 sat greater than 94%  Past Medical History Past Medical History:  Diagnosis Date   Breast cancer, right (HCC)    Contact lens/glasses fitting    wears contacts or glasses   Diarrhea    --post gallbladder surgery   Family history of bone cancer    Family history of brain cancer    Family history of stomach cancer    Family history of uterine cancer    Fibroids    History of radiation therapy    Hypertension    No pertinent past medical history    Personal history of radiation therapy    PONV (postoperative nausea and vomiting)    Right ovarian cyst    Vertigo    Patient Active Problem List   Diagnosis Date Noted   Pulmonary hypertension (HCC) 02/15/2023   Hypoxic respiratory failure (HCC) 01/11/2023   LFT elevation 01/11/2023   Hepatomegaly 01/11/2023   Compression fracture of T9 vertebra (HCC) 01/11/2023   Compression fracture of T10 vertebra (HCC) 01/11/2023   Cancer, metastatic to bone (HCC) 12/21/2022   Primary cancer of bone with metastasis to other site [facility]) 11/30/2022   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 Authorizing Provider  dexamethasone  (DECADRON ) 4 MG tablet Take 1 tablet (4 mg total) by mouth daily. Take 1 tablet day chemotherapy 1 tablet 2 days after chemo with food 01/13/23   Gudena, Vinay, MD  docusate sodium  (COLACE) 100 MG capsule Take 1 capsule (100 mg total) by mouth daily. 04/09/23   Gudena, Vinay, MD  furosemide  (LASIX ) 20 MG tablet TAKE 1 TABLET BY MOUTH EVERY DAY WITH BREAKFAST 07/07/23   Odean Potts, MD  LORazepam  (ATIVAN ) 0.5 MG tablet Take 1 tablet (0.5 mg total) by mouth every 8 (eight) hours as needed. for anxiety 07/23/23   Odean Potts, MD  omeprazole  (PRILOSEC) 20 MG capsule TAKE 1 CAPSULE BY MOUTH 2 (TWO) TIMES DAILY BEFORE A MEAL. 04/21/23   Hope Almarie ORN, NP  ondansetron  (ZOFRAN ) 8 MG tablet Take 1 tablet (8 mg total) by mouth every 8 (eight) hours as needed for nausea or vomiting. 11/03/22   Gudena, Vinay, MD  spironolactone  (ALDACTONE ) 25 MG tablet TAKE 1 TABLET (25 MG  TOTAL) BY MOUTH DAILY. 07/08/23   Gudena, Vinay, MD  valsartan  (DIOVAN ) 40 MG tablet Take 40 mg by mouth at bedtime. 07/13/23   [provider]  venlafaxine  XR (EFFEXOR -XR) 37.5 MG 24 hr capsule Take 1 capsule (37.5 mg total) by mouth daily. 04/02/23   Odean Potts, MD                                                                                                                                    Past Surgical History Past Surgical History:  Procedure Laterality Date   BLADDER SUSPENSION     BREAST BIOPSY  09/08/2012   Procedure: BREAST  BIOPSY;  Surgeon: Krystal JINNY Russell, MD;  Location: [facility];  Service: General;  Laterality: Left;  remove left breast mass   BREAST LUMPECTOMY Right 10/2019   BREAST LUMPECTOMY WITH RADIOACTIVE SEED AND SENTINEL LYMPH NODE BIOPSY Right 10/04/2019   Procedure: RIGHT BREAST LUMPECTOMY WITH BRACKETED RADIOACTIVE SEEDS, RIGHT BREAST RADIOACTIVE SEED GUIDED EXCISION BIOPSY, AND RIGHT SENTINEL LYMPH NODE BIOPSY;  Surgeon: Aron Shoulders, MD;  Location: [facility];  Service: General;  Laterality: Right;   BREAST SURGERY     lumpectomy x2   BUNIONECTOMY Right 01/2016   CHOLECYSTECTOMY     COLONOSCOPY     CYSTOSCOPY N/A 01/16/2020   Procedure: CYSTOSCOPY;  Surgeon: Cathlyn JAYSON Nikki Bobie FORBES, MD;  Location: [facility];  Service: Gynecology;  Laterality: N/A;   IR BONE TUMOR(S)RF ABLATION  12/10/2022   IR IMAGING GUIDED PORT INSERTION  01/18/2023   IR KYPHO THORACIC WITH BONE BIOPSY  12/10/2022   IR RADIOLOGIST EVAL & MGMT  12/24/2022   MASTOPEXY Right 10/04/2019   Procedure: RIGHT BREAST MASTOPEXY;  Surgeon: Aron Shoulders, MD;  Location: [facility];  Service: General;  Laterality: Right;   RE-EXCISION OF BREAST LUMPECTOMY Right 10/31/2019   Procedure: RIGHT RE-EXCISION OF BREAST LUMPECTOMY;  Surgeon: Aron Shoulders, MD;  Location: [facility];  Service: General;  Laterality: Right;   TOTAL LAPAROSCOPIC HYSTERECTOMY WITH BILATERAL SALPINGO OOPHORECTOMY N/A 01/16/2020   Procedure: TOTAL LAPAROSCOPIC HYSTERECTOMY WITH BILATERAL SALPINGO OOPHORECTOMY/COLLECTION OF PELVIC WASHINGS, LYSIS OF ADHESIONS;  Surgeon: Cathlyn JAYSON Nikki Bobie FORBES, MD;  Location: [facility];  Service: Gynecology;  Laterality: N/A;  collection of pelvic washings   TUBAL LIGATION     Family History Family History  Problem Relation Age of Onset   Hypertension Father    Stomach cancer Father        may have been colon, diagnosed in his 54s   Depression  Mother    Dementia Mother    Thyroid disease Sister    Heart disease Sister    Brain cancer Maternal Aunt 55   Cancer Maternal Grandmother        undetermined type, diagnosed in her 13s   Bone cancer Paternal Grandfather 27   Uterine cancer  Paternal Aunt 20   Uterine cancer Cousin        paternal 1st cousin, diagnosed in her late 70s   Uterine cancer Cousin 32       paternal 1st cousin    Social History Social History   Tobacco Use   Smoking status: Former    Current packs/day: 0.00    Types: Cigarettes    Quit date: 03/15/1996    Years since quitting: 27.4   Smokeless tobacco: Never  Vaping Use   Vaping status: Never Used  Substance Use Topics   Alcohol use: Not Currently    Comment: occ wine   Drug use: No   Allergies Metronidazole , Other, and Tape  Review of Systems Review of Systems  Constitutional:  Negative for chills and fever.  HENT:  Negative for congestion.   Respiratory:  Positive for cough and shortness of breath. Negative for chest tightness.   Cardiovascular:  Negative for chest pain and palpitations.  Gastrointestinal:  Negative for abdominal pain and vomiting.  Genitourinary:  Negative for dysuria.  Musculoskeletal:  Positive for arthralgias.  Neurological:  Negative for syncope and light-headedness.  All other systems reviewed and are negative.   Physical Exam Vital Signs  I have reviewed the triage vital signs BP 122/73   Pulse 98   Temp 98.3 F (36.8 C) (Oral)   Resp (!) 22   LMP 09/01/2007 (Exact Date)   SpO2 95%  Physical Exam Vitals and nursing note reviewed.  Constitutional:      General: She is not in acute distress.    Appearance: Normal appearance. She is not ill-appearing.  HENT:     Head: Normocephalic and atraumatic.     Right Ear: External ear normal.     Left Ear: External ear normal.     Nose: Nose normal.     Mouth/Throat:     Mouth: Mucous membranes are moist.  Eyes:     General: No scleral icterus.       Right  eye: No discharge.        Left eye: No discharge.  Cardiovascular:     Rate and Rhythm: Normal rate and regular rhythm.     Pulses: Normal pulses.     Heart sounds: Normal heart sounds.  Pulmonary:     Effort: Pulmonary effort is normal. No respiratory distress.     Breath sounds: Normal breath sounds. No stridor.  Chest:    Abdominal:     General: Abdomen is flat. There is no distension.     Palpations: Abdomen is soft.     Tenderness: There is no abdominal tenderness.  Musculoskeletal:     Cervical back: No rigidity.     Right lower leg: No edema.     Left lower leg: No edema.  Skin:    General: Skin is warm and dry.     Capillary Refill: Capillary refill takes less than 2 seconds.  Neurological:     Mental Status: She is alert and oriented to person, place, and time.     GCS: GCS eye subscore is 4. GCS verbal subscore is 5. GCS motor subscore is 6.  Psychiatric:        Mood and Affect: Mood normal.        Behavior: Behavior normal. Behavior is cooperative.     ED Results and Treatments Labs (all labs ordered are listed, but only abnormal results are displayed) Labs Reviewed  BASIC METABOLIC PANEL - Abnormal; Notable for the following components:  Result Value   Sodium 134 (*)    Glucose, Bld 165 (*)    Calcium 8.7 (*)    All other components within normal limits  CBC WITH DIFFERENTIAL/PLATELET - Abnormal; Notable for the following components:   RBC 3.33 (*)    MCV 109.3 (*)    MCH 36.6 (*)    RDW 16.9 (*)    Platelets 117 (*)    All other components within normal limits  RESP PANEL BY RT-PCR (RSV, FLU A&B, COVID)  RVPGX2  BRAIN NATRIURETIC PEPTIDE                                                                                                                          Radiology CT Angio Chest PE W and/or Wo Contrast Result Date: 09/02/2023 CLINICAL DATA:  Cough and history of breast carcinoma, initial encounter EXAM: CT ANGIOGRAPHY CHEST WITH CONTRAST  TECHNIQUE: Multidetector CT imaging of the chest was performed using the standard protocol during bolus administration of intravenous contrast. Multiplanar CT image reconstructions and MIPs were obtained to evaluate the vascular anatomy. RADIATION DOSE REDUCTION: This exam was performed according to the departmental dose-optimization program which includes automated exposure control, adjustment of the mA and/or kV according to patient size and/or use of iterative reconstruction technique. CONTRAST:  75mL OMNIPAQUE  IOHEXOL  350 MG/ML SOLN COMPARISON:  06/07/2023 FINDINGS: Cardiovascular: Thoracic aorta is within normal limits without aneurysmal dilatation or dissection. Heart is mildly enlarged in size. Right chest wall port is noted with catheter extending into the right atrium. The pulmonary artery shows a normal branching pattern bilaterally. No intraluminal filling defect to suggest pulmonary embolism is seen. No significant coronary calcifications are noted. Mediastinum/Nodes: Thoracic inlet is within normal limits. No hilar or mediastinal adenopathy is noted. The esophagus as visualized is within normal limits. Lungs/Pleura: Apical pleural and parenchymal scarring is noted particularly on the right. Patchy infiltrate is noted in the right lower lobe medially. No sizable effusion is seen. Upper Abdomen: Mild nodularity to the liver is noted similar to that seen on prior PET-CT. Gallbladder has been surgically removed. Very mild ascites is noted stable from the prior study. Musculoskeletal: Changes consistent with prior vertebral augmentation are again seen at T9. Mild inferior compression deformity is noted at T10 stable from the prior exams. Review of the MIP images confirms the above findings. IMPRESSION: No evidence of pulmonary emboli. Patchy infiltrate in the right lower lobe consistent with early pneumonia. Mild nodularity to the liver similar to that seen on prior PET-CT. Electronically Signed   By: Oneil Devonshire M.D.   On: 09/02/2023 17:48   DG Chest 2 View Result Date: 09/02/2023 CLINICAL DATA:  Cough for several days.  History of breast cancer EXAM: CHEST - 2 VIEW COMPARISON:  X-ray 01/15/2023. PET-CT 06/07/2023. FINDINGS: No consolidation, pneumothorax or effusion. Questionable subtle opacity in the right lung base. No edema. Normal cardiopericardial silhouette. Right IJ chest port with the tip along the SVC right atrial junction  region. Surgical changes along the right hemithorax and right breast. Degenerative changes of the spine. There is augmentation cement along a lower thoracic spine level which has some mild height loss. Similar to previous. Surgical clips in the upper abdomen. Punctate dense right upper lobe lung nodule as well, unchanged from previous. Please correlate for a calcified nodule. IMPRESSION: Chest port. Question subtle opacity in the right lung base. Recommend follow-up Electronically Signed   By: Ranell Bring M.D.   On: 09/02/2023 12:31    Pertinent labs & imaging results that were available during my care of the patient were reviewed by me and considered in my medical decision making (see MDM for details).  Medications Ordered in ED Medications  cefTRIAXone  (ROCEPHIN ) 1 g in sodium chloride  0.9 % 100 mL IVPB (has no administration in time range)  azithromycin  (ZITHROMAX ) 500 mg in sodium chloride  0.9 % 250 mL IVPB (has no administration in time range)  iohexol  (OMNIPAQUE ) 350 MG/ML injection 75 mL (75 mLs Intravenous Contrast Given 09/02/23 1509)  ipratropium-albuterol  (DUONEB) 0.5-2.5 (3) MG/3ML nebulizer solution 3 mL (3 mLs Nebulization Given 09/02/23 1717)                                                                                                                                     Procedures .Critical Care  Performed by: Elnor Jayson LABOR, DO Authorized by: Elnor Jayson LABOR, DO   Critical care provider statement:    Critical care time (minutes):  30   Critical care  time was exclusive of:  Separately billable procedures and treating other patients   Critical care was necessary to treat or prevent imminent or life-threatening deterioration of the following conditions:  Respiratory failure   Critical care was time spent personally by me on the following activities:  Development of treatment plan with patient or surrogate, discussions with consultants, evaluation of patient's response to treatment, examination of patient, ordering and review of laboratory studies, ordering and review of radiographic studies, ordering and performing treatments and interventions, pulse oximetry, re-evaluation of patient's condition, review of old charts and obtaining history from patient or surrogate   (including critical care time)  Medical Decision Making / ED Course    Medical Decision Making:    Theresa [name] is a 68 y.o. female with past medical history as below, significant for metastatic breast cancer, hypertension, pulmonary hypertension, who presents to the ED with complaint of dyspnea. The complaint involves an extensive differential diagnosis and also carries with it a high risk of complications and morbidity.  Serious etiology was considered. Ddx includes but is not limited to: In my evaluation of this patient's dyspnea my DDx includes, but is not limited to, pneumonia, pulmonary embolism, pneumothorax, pulmonary edema, metabolic acidosis, asthma, COPD, cardiac cause, anemia, anxiety, etc.    Complete initial physical exam performed, notably the patient was in no distress, she is breathing comfortably on 2 L nasal  cannula.    Reviewed and confirmed nursing documentation for past medical history, family history, social history.  Vital signs reviewed.      Clinical Course as of 09/02/23 1757  Thu Sep 02, 2023  1719 Assumed care from Dr Elnor. 68 yo F with hx of breast cancer (metastatic) and pulmonary HTN was hypoxic to 85% CXR with possible pna. Her and husband have  had URI symptoms. Getting CT PE study. Currently on new 2L [place]. Given neb. Awaiting CTA. COVID/Flu negative.  [RP]    Clinical Course User Index [RP] Yolande Lamar BROCKS, MD    Brief summary: 68 year old female history of breast cancer currently undergoing treatment here with hypoxia, abnormal x-ray, URI symptoms, cough, dyspnea.  Positive contacts with spouse.  Home oxygen use, requiring 2 L nasal cannula to maintain saturation greater than 94% in the ER.  Will check screening labs, get CT PE.  X-ray and labs reviewed from this morning, x-ray concern possible infiltrate.   For her breast cancer she is taking DENOSUMAB  / Fam-Trastuzumab Deruxtecan-nxki     She is still requiring 2 L nasal cannula.  No home oxygen requirement.  X-ray concern for possible early pneumonia. CTPE concerning for PNA. Patient will likely admission given new oxygen requirement. Handoff to incoming EDP pending admit/recheck          Additional history obtained: -Additional history obtained from family -External records from outside source obtained and reviewed including: Chart review including previous notes, labs, imaging, consultation notes including  Prior labs and imaging, prior admission, primary care documentation   Lab Tests: -I ordered, reviewed, and interpreted labs.   The pertinent results include:   Labs Reviewed  BASIC METABOLIC PANEL - Abnormal; Notable for the following components:      Result Value   Sodium 134 (*)    Glucose, Bld 165 (*)    Calcium 8.7 (*)    All other components within normal limits  CBC WITH DIFFERENTIAL/PLATELET - Abnormal; Notable for the following components:   RBC 3.33 (*)    MCV 109.3 (*)    MCH 36.6 (*)    RDW 16.9 (*)    Platelets 117 (*)    All other components within normal limits  RESP PANEL BY RT-PCR (RSV, FLU A&B, COVID)  RVPGX2  BRAIN NATRIURETIC PEPTIDE    Notable for labs stable  EKG   EKG Interpretation Date/Time:    Ventricular Rate:    PR  Interval:    QRS Duration:    QT Interval:    QTC Calculation:   R Axis:      Text Interpretation:           Imaging Studies ordered: I ordered imaging studies including CT PE I independently visualized the following imaging with scope of interpretation limited to determining acute life threatening conditions related to emergency care; findings noted above I independently visualized and interpreted imaging. I agree with the radiologist interpretation   Medicines ordered and prescription drug management: Meds ordered this encounter  Medications   iohexol  (OMNIPAQUE ) 350 MG/ML injection 75 mL   ipratropium-albuterol  (DUONEB) 0.5-2.5 (3) MG/3ML nebulizer solution 3 mL   cefTRIAXone  (ROCEPHIN ) 1 g in sodium chloride  0.9 % 100 mL IVPB    Antibiotic Indication::   CAP   azithromycin  (ZITHROMAX ) 500 mg in sodium chloride  0.9 % 250 mL IVPB    Antibiotic Indication::   CAP    -I have reviewed the patients home medicines and have made adjustments as needed   Consultations  Obtained: na   Cardiac Monitoring: The patient was maintained on a cardiac monitor.  I personally viewed and interpreted the cardiac monitored which showed an underlying rhythm of: NSR Continuous pulse oximetry interpreted by myself, 97% on 2L.    Social Determinants of Health:  Diagnosis or treatment significantly limited by social determinants of health: former smoker   Reevaluation: After the interventions noted above, I reevaluated the patient and found that they have improved  Co morbidities that complicate the patient evaluation  Past Medical History:  Diagnosis Date   Breast cancer, right (HCC)    Contact lens/glasses fitting    wears contacts or glasses   Diarrhea    --post gallbladder surgery   Family history of bone cancer    Family history of brain cancer    Family history of stomach cancer    Family history of uterine cancer    Fibroids    History of radiation therapy    Hypertension     No pertinent past medical history    Personal history of radiation therapy    PONV (postoperative nausea and vomiting)    Right ovarian cyst    Vertigo       Dispostion: Disposition decision including need for hospitalization was considered, and patient disposition pending at time of sign out.    Final Clinical Impression(s) / ED Diagnoses Final diagnoses:  None        Elnor Jayson LABOR, DO 09/02/23 1757

## 2023-09-02 NOTE — ED Notes (Signed)
 Pt desatted to 89% increased ox to 3L

## 2023-09-02 NOTE — Addendum Note (Signed)
 Addended by: Henriette Combs E on: 09/02/2023 11:57 AM   Modules accepted: Orders

## 2023-09-02 NOTE — ED Triage Notes (Signed)
 Pt brought over from cancer center for newly developed hypoxia.  Pt is not oxygen dependent. Pt states family member has respiratory illness and endorses dry cough with clear sputum.   Pt denies nasal congestion and fever.

## 2023-09-02 NOTE — Progress Notes (Signed)
 Received mychart message from pt with complaint of cough x several days, Covid negative. MD out of office, RN reviewed with [facility] and verbal orders received for pt to undergo chest xray and [facility] visit.  Orders placed, appt scheduled, pt notified and verbalized understanding.

## 2023-09-02 NOTE — Progress Notes (Addendum)
 Symptom Management Consult Note [facility]    Patient Care Team: Sheryle Carwin, MD as PCP - General (Internal Medicine) Aron Shoulders, MD as Consulting Physician (General Surgery) Izell Domino, MD as Attending Physician (Radiation Oncology) Cathlyn JAYSON Nikki Bobie FORBES, MD as Consulting Physician (Obstetrics and Gynecology) Cary Doffing, MD as Consulting Physician (Dermatology) Cam Morene ORN, MD as Consulting Physician (Urology) Kristie Lamprey, MD as Consulting Physician (Gastroenterology) Beverley Evalene BIRCH, MD as Attending Physician (Orthopedic Surgery) Odean Potts, MD as Consulting Physician (Hematology and Oncology)    Name / MRN / DOB: Theresa [name]  [id number]  1955/11/09   Date of visit: 09/02/2023   Chief Complaint/Reason for visit: cough   Current Therapy: Enhertu   Last treatment:  Day 1   Cycle 11 on 08/13/23   ASSESSMENT & PLAN: Patient is a 68 y.o. female with oncologic history of malignant neoplasm of lower-inner quadrant of right breast of female, estrogen receptor positive followed by Dr. Odean.  I have viewed most recent oncology note and lab work.    #Malignant neoplasm of lower-inner quadrant of right breast of female, estrogen receptor positive  - Next appointment with oncologist is 09/06/23   #Cough -Afebrile, tachy to 120 and hypoxic to 81% on room air with ambulation. Noted to have tachypnea with clear lung exam. -Placed on 2L [place] with improvement to 94%. Patient also received 500 ml NS while in clinic for hydration support. -CBC showing WBC WNL. Platelets are low at 123k, stable with recent. -Chest xray shows ?subtle opacity in right lung base. Without findings of an obvious pneumonia there is concern for pneumonitis vs PE. Discussed with on call provider Dr. Tina who agrees with plan for ED evaluation for further work up. Report given to accepting ED RN.    Heme/Onc History: Oncology History  Malignant neoplasm of lower-inner  quadrant of right breast of female, estrogen receptor positive (HCC)  07/03/2019 Initial Diagnosis   right lower inner quadrant biopsy, for a clinicaly multiple T1c N0, stage IA invasive ductal carcinoma, grade 2, E-cadherin positive, strongly estrogen receptor positive but progesterone  receptor negative and HER-2 not amplified, with an MIB-1 of 5%             (a) breast MRI 08/07/2019 showed 2 additional suspicious areas in the right breast and 1 in the left breast              (b) biopsy of all 3 areas 08/21/2019 showed no malignancy   07/26/2019 -  Anti-estrogen oral therapy   tamoxifen  started neoadjuvantly 07/26/2019; to be continued for 10 years.  S/p TAH/BSO on 01/16/2020 with benign pathology   08/04/2019 Genetic Testing   Negative genetic testing:  No pathogenic variants detected on the [facility] Breast Cancer STAT panel or the Common Hereditary Cancers panel. The report date is 08/04/2019.  The STAT Breast cancer panel offered by [facility] includes sequencing and rearrangement analysis for the following 9 genes:  ATM, BRCA1, BRCA2, CDH1, CHEK2, PALB2, PTEN, STK11 and TP53.  The Common Hereditary Cancers Panel offered by [facility] includes sequencing and/or deletion duplication testing of the following 48 genes: APC, ATM, AXIN2, BARD1, BMPR1A, BRCA1, BRCA2, BRIP1, CDH1, CDK4, CDKN2A (p14ARF), CDKN2A (p16INK4a), CHEK2, CTNNA1, DICER1, EPCAM (Deletion/duplication testing only), GREM1 (promoter region deletion/duplication testing only), KIT, MEN1, MLH1, MSH2, MSH3, MSH6, MUTYH, NBN, NF1, NHTL1, PALB2, PDGFRA, PMS2, POLD1, POLE, PTEN, RAD50, RAD51C, RAD51D, RNF43, SDHB, SDHC, SDHD, SMAD4, SMARCA4. STK11, TP53, TSC1, TSC2, and VHL.  The following genes  were evaluated for sequence changes only: SDHA and HOXB13 c.251G>A variant only.    10/04/2019 Cancer Staging   Staging form: Breast, AJCC 8th Edition - Pathologic stage from 10/04/2019: Stage IIB (pT2, pN1a(sn), cM0, G2, ER+, PR-, HER2-) - Signed by Crawford Morna Pickle, NP on 10/18/2019   10/04/2019 Surgery    right lumpectomy and sentinel lymph node sampling 10/04/2019 showed a pT2 pN1, stage IIB invasive ductal carcinoma, grade 2, with positive lymphovascular invasion and a positive superior margin             (a) 3 sentinel lymph nodes removed, one with macro metastatic deposit, 1 with a micrometastatic deposit             (b) additional surgery for margin clearance   10/04/2019 Miscellaneous    MammaPrint on the 10/04/2019 sample shows a low risk luminal A tumor predicting a 5-year metastasis free survival of 96% without chemotherapy, and a chemotherapy benefit of less than 1.5%    11/27/2019 - 01/05/2020 Radiation Therapy    Site Technique Total Dose (Gy) Dose per Fx (Gy) Completed Fx Beam Energies  Breast, Right: Breast_Rt 3D 50/50 2 25/25 6X, 10X  Breast, Right: Breast_Rt_SCV_PAB 3D 50/50 2 25/25 6X, 10X  Breast, Right: Breast_Rt_Bst 3D 10/10 2 5/5 6X, 10X     09/29/2022 Imaging   IMPRESSION: 1. Surgical changes involving the right medial breast. No obvious recurrent breast mass or chest wall mass. 2. Mediastinal and hilar lymphadenopathy suggesting metastatic disease. 3. Patchy tree-in-bud type nodularity in the lungs suggesting chronic inflammation or atypical infection such as MAC. No definite pulmonary metastatic nodules. 4. Large partially necrotic mass involving most of the right hepatic lobe and a 2.5 cm lesion in segment 3 of the liver consistent with metastatic disease. 5. 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   10/06/2022 Initial Biopsy   Liver biopsy: + for malignancy, metastatic poorly differentiated carcinoma, likely breast origin, biomarkers pending, insufficient tissue for molecular testing. (Resulted in EPIC on 10/13/2022)   10/07/2022 PET scan   PET scan on 10/07/2022 that showed hypermetabolic metastatic breast cancer with hypermetabolic mediastinal and hilar adenopathy,  hypermetabolic hepatic metastatic disease and bone disease.  There were no findings for pulmonary metastatic disease.   10/21/2022 -  Anti-estrogen oral therapy   Verzinio and Letrozole    01/15/2023 -  Chemotherapy   Patient is on Treatment Plan : BREAST METASTATIC Fam-Trastuzumab Deruxtecan-nxki  (Enhertu ) (5.4) q21d     02/19/2023 Cancer Staging   Staging form: Breast, AJCC 8th Edition - Pathologic: Stage IV (pM1) - Signed by Crawford Morna Pickle, NP on 02/19/2023   Cancer, metastatic to bone (HCC)  12/21/2022 Initial Diagnosis   Cancer, metastatic to bone (HCC)   01/15/2023 -  Chemotherapy   Patient is on Treatment Plan : BREAST METASTATIC Fam-Trastuzumab Deruxtecan-nxki  (Enhertu ) (5.4) q21d         Interval history-: Discussed the use of AI scribe software for clinical note transcription with the patient, who gave verbal consent to proceed.   Theresa [name] is a 68 y.o. female with oncologic history as above presenting to [facility] today with chief complaint of cough. Patient presents unaccompanied to visit today.  The patient reports a cough and dyspnea that began x 5 days ago. The symptoms started after exposure to her husband who had cold symptoms after traveling to [place]. The cough is severe, leading to episodes of coughing spells, particularly with deep breaths. The patient has been taking  Advil  Sinus and mucinex , which provides temporary relief. Patient had negative home covid test.  The patient also reports she has been monitoring her oxygen levels at home with a portable pulse ox and the lowest recorded has been 84%, primarily after physical activity. With much effort she has been able to it to around 92-93% with effort. The patient denies fever but reports feeling cold after outdoor activities. She denies any chest pain or wheezing.  In addition to respiratory symptoms, the patient has experienced some fatigue and gastrointestinal symptoms, including nausea and a day of multiple  episodes of severe diarrhea, which was managed with Pepto Bismol. The patient also reports a loss of appetite and decreased fluid intake.  She has been monitoring her heart rate at home, which has been consistently high, around 110-120 bpm. This is a known issue and has been discussed with her cardiologist. The patient has a history of smoking but quit 30 years ago.  She reports that she does have a home oxygen concentrator, which was provided after a previous episode of pneumonia back in May 2024 which she used only while recovering. She has not needed supplemental oxygen in several moths.  She followed up with pulmonology after that hospital admission for presumed pulmonary hypertension. Last visit was 07/27/23 and it was noted that pulmonary hypertension resolved and no further follow up needed.  ROS  All other systems are reviewed and are negative for acute change except as noted in the HPI.    Allergies  Allergen Reactions   Metronidazole  Other (See Comments)    C-Diff C-Diff C-Diff   Other Other (See Comments) and Rash    Dermabond - redness and burning, blistering Dermabond - redness and burning, blistering Dermabond - redness and burning, blistering burning skin   Tape Rash    burning skin burning skin     Past Medical History:  Diagnosis Date   Breast cancer, right (HCC)    Contact lens/glasses fitting    wears contacts or glasses   Diarrhea    --post gallbladder surgery   Family history of bone cancer    Family history of brain cancer    Family history of stomach cancer    Family history of uterine cancer    Fibroids    History of radiation therapy    Hypertension    No pertinent past medical history    Personal history of radiation therapy    PONV (postoperative nausea and vomiting)    Right ovarian cyst    Vertigo      Past Surgical History:  Procedure Laterality Date   BLADDER SUSPENSION     BREAST BIOPSY  09/08/2012   Procedure: BREAST BIOPSY;   Surgeon: Krystal JINNY Russell, MD;  Location: [facility];  Service: General;  Laterality: Left;  remove left breast mass   BREAST LUMPECTOMY Right 10/2019   BREAST LUMPECTOMY WITH RADIOACTIVE SEED AND SENTINEL LYMPH NODE BIOPSY Right 10/04/2019   Procedure: RIGHT BREAST LUMPECTOMY WITH BRACKETED RADIOACTIVE SEEDS, RIGHT BREAST RADIOACTIVE SEED GUIDED EXCISION BIOPSY, AND RIGHT SENTINEL LYMPH NODE BIOPSY;  Surgeon: Aron Shoulders, MD;  Location: [facility];  Service: General;  Laterality: Right;   BREAST SURGERY     lumpectomy x2   BUNIONECTOMY Right 01/2016   CHOLECYSTECTOMY     COLONOSCOPY     CYSTOSCOPY N/A 01/16/2020   Procedure: CYSTOSCOPY;  Surgeon: Cathlyn JAYSON Nikki Bobie FORBES, MD;  Location: [facility];  Service: Gynecology;  Laterality: N/A;  IR BONE TUMOR(S)RF ABLATION  12/10/2022   IR IMAGING GUIDED PORT INSERTION  01/18/2023   IR KYPHO THORACIC WITH BONE BIOPSY  12/10/2022   IR RADIOLOGIST EVAL & MGMT  12/24/2022   MASTOPEXY Right 10/04/2019   Procedure: RIGHT BREAST MASTOPEXY;  Surgeon: Aron Shoulders, MD;  Location: [facility];  Service: General;  Laterality: Right;   RE-EXCISION OF BREAST LUMPECTOMY Right 10/31/2019   Procedure: RIGHT RE-EXCISION OF BREAST LUMPECTOMY;  Surgeon: Aron Shoulders, MD;  Location: [facility];  Service: General;  Laterality: Right;   TOTAL LAPAROSCOPIC HYSTERECTOMY WITH BILATERAL SALPINGO OOPHORECTOMY N/A 01/16/2020   Procedure: TOTAL LAPAROSCOPIC HYSTERECTOMY WITH BILATERAL SALPINGO OOPHORECTOMY/COLLECTION OF PELVIC WASHINGS, LYSIS OF ADHESIONS;  Surgeon: Cathlyn JAYSON Nikki Bobie FORBES, MD;  Location: [facility];  Service: Gynecology;  Laterality: N/A;  collection of pelvic washings   TUBAL LIGATION      Social History   Socioeconomic History   Marital status: Married    Spouse name: Daryl    Number of children: Not on file   Years of education: Not on file   Highest education  level: Not on file  Occupational History   Not on file  Tobacco Use   Smoking status: Former    Current packs/day: 0.00    Types: Cigarettes    Quit date: 03/15/1996    Years since quitting: 27.4   Smokeless tobacco: Never  Vaping Use   Vaping status: Never Used  Substance and Sexual Activity   Alcohol use: Not Currently    Comment: occ wine   Drug use: No   Sexual activity: Yes    Partners: Male    Birth control/protection: Surgical    Comment: BTL/hyst  Other Topics Concern   Not on file  Social History Narrative   Not on file   Social Drivers of Health   Financial Resource Strain: Not on file  Food Insecurity: No Food Insecurity (01/11/2023)   Hunger Vital Sign    Worried About Running Out of Food in the Last Year: Never true    Ran Out of Food in the Last Year: Never true  Transportation Needs: No Transportation Needs (01/11/2023)   PRAPARE - Administrator, Civil Service (Medical): No    Lack of Transportation (Non-Medical): No  Physical Activity: Not on file  Stress: Not on file  Social Connections: Not on file  Intimate Partner Violence: Not At Risk (01/11/2023)   Humiliation, Afraid, Rape, and Kick questionnaire    Fear of Current or Ex-Partner: No    Emotionally Abused: No    Physically Abused: No    Sexually Abused: No    Family History  Problem Relation Age of Onset   Hypertension Father    Stomach cancer Father        may have been colon, diagnosed in his 58s   Depression Mother    Dementia Mother    Thyroid disease Sister    Heart disease Sister    Brain cancer Maternal Aunt 63   Cancer Maternal Grandmother        undetermined type, diagnosed in her 66s   Bone cancer Paternal Grandfather 103   Uterine cancer Paternal Aunt 52   Uterine cancer Cousin        paternal 1st cousin, diagnosed in her late 1s   Uterine cancer Cousin 56       paternal 1st cousin     Current Outpatient Medications:    dexamethasone  (DECADRON )  4 MG tablet,  Take 1 tablet (4 mg total) by mouth daily. Take 1 tablet day chemotherapy 1 tablet 2 days after chemo with food, Disp: 30 tablet, Rfl: 0   docusate sodium  (COLACE) 100 MG capsule, Take 1 capsule (100 mg total) by mouth daily., Disp: 30 capsule, Rfl: 6   furosemide  (LASIX ) 20 MG tablet, TAKE 1 TABLET BY MOUTH EVERY DAY WITH BREAKFAST, Disp: 90 tablet, Rfl: 1   LORazepam  (ATIVAN ) 0.5 MG tablet, Take 1 tablet (0.5 mg total) by mouth every 8 (eight) hours as needed. for anxiety, Disp: 30 tablet, Rfl: 3   omeprazole  (PRILOSEC) 20 MG capsule, TAKE 1 CAPSULE BY MOUTH 2 (TWO) TIMES DAILY BEFORE A MEAL., Disp: 180 capsule, Rfl: 3   ondansetron  (ZOFRAN ) 8 MG tablet, Take 1 tablet (8 mg total) by mouth every 8 (eight) hours as needed for nausea or vomiting., Disp: 30 tablet, Rfl: 2   spironolactone  (ALDACTONE ) 25 MG tablet, TAKE 1 TABLET (25 MG TOTAL) BY MOUTH DAILY., Disp: 90 tablet, Rfl: 1   valsartan  (DIOVAN ) 40 MG tablet, Take 40 mg by mouth at bedtime., Disp: , Rfl:    venlafaxine  XR (EFFEXOR -XR) 37.5 MG 24 hr capsule, Take 1 capsule (37.5 mg total) by mouth daily., Disp: 90 capsule, Rfl: 4  PHYSICAL EXAM: ECOG FS:1 - Symptomatic but completely ambulatory    Vitals:   09/02/23 1057 09/02/23 1102  BP: 123/73   Pulse: (!) 112 (!) 120  Resp: 17   Temp: 98.3 F (36.8 C)   TempSrc: Oral   SpO2: 92% (!) 81%  Weight: 154 lb 11.2 oz (70.2 kg)    Physical Exam Vitals and nursing note reviewed.  Constitutional:      Appearance: She is not ill-appearing or toxic-appearing.  HENT:     Head: Normocephalic.     Mouth/Throat:     Mouth: Mucous membranes are dry.  Eyes:     Conjunctiva/sclera: Conjunctivae normal.  Cardiovascular:     Rate and Rhythm: Regular rhythm. Tachycardia present.     Pulses: Normal pulses.     Heart sounds: Normal heart sounds.  Pulmonary:     Breath sounds: Normal breath sounds.     Comments: tachypneic Abdominal:     General: There is no distension.  Musculoskeletal:      Cervical back: Normal range of motion.     Right lower leg: No edema.     Left lower leg: No edema.  Skin:    General: Skin is warm and dry.  Neurological:     Mental Status: She is alert.        LABORATORY DATA: I have reviewed the data as listed    Latest Ref Rng & Units 09/02/2023   11:12 AM 08/13/2023    9:54 AM 07/23/2023   10:07 AM  CBC  WBC 4.0 - 10.5 K/uL 6.9  5.2  4.3   Hemoglobin 12.0 - 15.0 g/dL 87.7  88.2  87.9   Hematocrit 36.0 - 46.0 % 35.0  33.2  33.7   Platelets 150 - 400 K/uL 123  122  102         Latest Ref Rng & Units 09/02/2023   11:12 AM 08/13/2023    9:54 AM 07/23/2023   10:07 AM  CMP  Glucose 70 - 99 mg/dL 892  830  851   BUN 8 - 23 mg/dL 10  7  10    Creatinine 0.44 - 1.00 mg/dL 9.23  9.41  9.36   Sodium 135 -  145 mmol/L 135  134  137   Potassium 3.5 - 5.1 mmol/L 4.2  4.2  4.3   Chloride 98 - 111 mmol/L 102  104  104   CO2 22 - 32 mmol/L 26  24  27    Calcium 8.9 - 10.3 mg/dL 9.6  8.9  9.4   Total Protein 6.5 - 8.1 g/dL 6.1  6.1  6.0   Total Bilirubin 0.0 - 1.2 mg/dL 1.6  1.3  1.2   Alkaline Phos 38 - 126 U/L 147  128  131   AST 15 - 41 U/L 34  35  42   ALT 0 - 44 U/L 13  15  21         RADIOGRAPHIC STUDIES (from last 24 hours if applicable) I have personally reviewed the radiological images as listed and agreed with the findings in the report. DG Chest 2 View Result Date: 09/02/2023 CLINICAL DATA:  Cough for several days.  History of breast cancer EXAM: CHEST - 2 VIEW COMPARISON:  X-ray 01/15/2023. PET-CT 06/07/2023. FINDINGS: No consolidation, pneumothorax or effusion. Questionable subtle opacity in the right lung base. No edema. Normal cardiopericardial silhouette. Right IJ chest port with the tip along the SVC right atrial junction region. Surgical changes along the right hemithorax and right breast. Degenerative changes of the spine. There is augmentation cement along a lower thoracic spine level which has some mild height loss. Similar  to previous. Surgical clips in the upper abdomen. Punctate dense right upper lobe lung nodule as well, unchanged from previous. Please correlate for a calcified nodule. IMPRESSION: Chest port. Question subtle opacity in the right lung base. Recommend follow-up Electronically Signed   By: Ranell Bring M.D.   On: 09/02/2023 12:31        Visit Diagnosis: 1. Malignant neoplasm of lower-inner quadrant of right breast of female, estrogen receptor positive (HCC)   2. Acute respiratory failure with hypoxia (HCC)      No orders of the defined types were placed in this encounter.   All questions were answered. The patient knows to call the clinic with any problems, questions or concerns. No barriers to learning was detected.  A total of more than 40 minutes were spent on this encounter with face-to-face time and non-face-to-face time, including preparing to see the patient, ordering tests and/or medications, counseling the patient and coordination of care as outlined above.    Thank you for allowing me to participate in the care of this patient.    Aastha Dayley E  Walisiewicz, PA-C Department of Hematology/Oncology [facility] Phone: [phone]  [phone]    09/02/2023 2:28 PM

## 2023-09-02 NOTE — Telephone Encounter (Signed)
 You are seeing this pt today in [facility].  Can you see if she needs this refill, last time we filled it was 01/13/23

## 2023-09-02 NOTE — Hospital Course (Addendum)
 68 year old female with metastatic breast CA with mets to bone and liver was admitted for hypoxia. She had had cough and O2 sats down to 81% with exertion. No fevers or chills but she has had some malaise. Workup in cancer center revealed questionable subtle opacity at right lung base and patient was admitted for management of CAP. COVID, RSV are negative. CTA negative for PE but does reveal patchy right base infiltrate.Patient was admitted and being treated with for acute hypoxic aspiratory failure with RLL infiltrates, cough. Patient was treated with IV antibiotics overall clinically improved remains afebrile however she is needing oxygen, will check for home oxygen need, at this time she is stable for discharge home.  Oncology has been made aware and will follow-up next week for her infusion

## 2023-09-02 NOTE — ED Provider Notes (Signed)
  Physical Exam  BP 122/73   Pulse 98   Temp 98.3 F (36.8 C) (Oral)   Resp (!) 22   LMP 09/01/2007 (Exact Date)   SpO2 95%   Physical Exam  Procedures  Procedures  ED Course / MDM   Clinical Course as of 09/02/23 2355  Thu Sep 02, 2023  1719 Assumed care from Dr Elnor. 68 yo F with hx of breast cancer (metastatic) and pulmonary HTN was hypoxic to 85% CXR with possible pna. Her and husband have had URI symptoms. Getting CT PE study. Currently on new 2L [place]. Given neb. Awaiting CTA. COVID/Flu negative.  [RP]  1846 CTA shows lower lobe pneumonia.  Started on ceftriaxone  and azithromycin .  Dr Tobie to admit since patient has new onset hypoxia [RP]    Clinical Course User Index [RP] Yolande Lamar BROCKS, MD   Medical Decision Making Amount and/or Complexity of Data Reviewed Labs: ordered. Radiology: ordered.  Risk Prescription drug management. Decision regarding hospitalization.      Yolande Lamar BROCKS, MD 09/02/23 [id number]

## 2023-09-02 NOTE — H&P (Addendum)
 History and Physical    Theresa [name] FMW:[id number] DOB: 1956-05-15 DOA: 09/02/2023  PCP: Sheryle Carwin, MD  Patient coming from: Home  I have personally briefly reviewed patient's old medical records in [facility]  Chief Complaint: Cough, hypoxia  HPI: Theresa [name] is a 68 y.o. 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  Patient states for the last few days she has been having new frequent cough which has been productive of clear/white sputum.  Cough has been worse at night.  She is not feeling fatigued.  She has a home pulse ox and saw that her oxygen levels were dropping to as low as 84%.  She had previously required supplemental oxygen at home when she was sick in May but has not required it for a while now.  She denies fevers, chills, diaphoresis.  She has not had any significant shortness of breath.  She went to the cancer center symptom management clinic earlier today.  A chest x-ray was obtained and showed questionable subtle opacity in the right lung base.  Her SpO2 dropped to 81% on room air with ambulation.  She was placed on 2 L O2 via [place] with improvement to 94% and she was given 500 cc normal saline IV fluids.  She was sent to the ED for further evaluation.  She does report a sick contact in her husband who has been having URI symptoms after a recent trip to [place].  ED Course  Labs/Imaging on admission: I have personally reviewed following labs and imaging studies.  Initial vitals showed BP 122/73, pulse 99, RR 22, temp 98.3 F, SpO2 95% on 2 L O2 via [place].  Labs showed WBC 5.4, hemoglobin 12.2, platelets 117,000, sodium 134, potassium 3.5, bicarb 22, BUN 9, creatinine 0.53, serum glucose 165, BNP 69.2.  SARS-CoV-2, influenza, RSV PCR negative.  Blood cultures ordered and pending collection.  CTA chest negative for evidence of PE.  Patchy  infiltrate in the right lower lobe consistent with early pneumonia seen.  Mild nodularity to the liver similar to that seen on prior PET/CT also noted.  Patient was given IV ceftriaxone  and azithromycin .  The hospitalist service was consulted to admit for further evaluation and management.  Review of Systems: All systems reviewed and are negative except as documented in history of present illness above.   Past Medical History:  Diagnosis Date   Breast cancer, right (HCC)    Contact lens/glasses fitting    wears contacts or glasses   Diarrhea    --post gallbladder surgery   Family history of bone cancer    Family history of brain cancer    Family history of stomach cancer    Family history of uterine cancer    Fibroids    History of radiation therapy    Hypertension    No pertinent past medical history    Personal history of radiation therapy    PONV (postoperative nausea and vomiting)    Right ovarian cyst    Vertigo     Past Surgical History:  Procedure Laterality Date   BLADDER SUSPENSION     BREAST BIOPSY  09/08/2012   Procedure: BREAST BIOPSY;  Surgeon: Krystal JINNY Russell, MD;  Location: [facility];  Service: General;  Laterality: Left;  remove left breast mass   BREAST LUMPECTOMY Right 10/2019   BREAST LUMPECTOMY WITH RADIOACTIVE  SEED AND SENTINEL LYMPH NODE BIOPSY Right 10/04/2019   Procedure: RIGHT BREAST LUMPECTOMY WITH BRACKETED RADIOACTIVE SEEDS, RIGHT BREAST RADIOACTIVE SEED GUIDED EXCISION BIOPSY, AND RIGHT SENTINEL LYMPH NODE BIOPSY;  Surgeon: Aron Shoulders, MD;  Location: [facility];  Service: General;  Laterality: Right;   BREAST SURGERY     lumpectomy x2   BUNIONECTOMY Right 01/2016   CHOLECYSTECTOMY     COLONOSCOPY     CYSTOSCOPY N/A 01/16/2020   Procedure: CYSTOSCOPY;  Surgeon: Cathlyn JAYSON Nikki Bobie FORBES, MD;  Location: [facility];  Service: Gynecology;  Laterality: N/A;   IR BONE TUMOR(S)RF ABLATION  12/10/2022   IR  IMAGING GUIDED PORT INSERTION  01/18/2023   IR KYPHO THORACIC WITH BONE BIOPSY  12/10/2022   IR RADIOLOGIST EVAL & MGMT  12/24/2022   MASTOPEXY Right 10/04/2019   Procedure: RIGHT BREAST MASTOPEXY;  Surgeon: Aron Shoulders, MD;  Location: [facility];  Service: General;  Laterality: Right;   RE-EXCISION OF BREAST LUMPECTOMY Right 10/31/2019   Procedure: RIGHT RE-EXCISION OF BREAST LUMPECTOMY;  Surgeon: Aron Shoulders, MD;  Location: [facility];  Service: General;  Laterality: Right;   TOTAL LAPAROSCOPIC HYSTERECTOMY WITH BILATERAL SALPINGO OOPHORECTOMY N/A 01/16/2020   Procedure: TOTAL LAPAROSCOPIC HYSTERECTOMY WITH BILATERAL SALPINGO OOPHORECTOMY/COLLECTION OF PELVIC WASHINGS, LYSIS OF ADHESIONS;  Surgeon: Cathlyn JAYSON Nikki Bobie FORBES, MD;  Location: [facility];  Service: Gynecology;  Laterality: N/A;  collection of pelvic washings   TUBAL LIGATION      Social History:  reports that she quit smoking about 27 years ago. Her smoking use included cigarettes. She has never used smokeless tobacco. She reports that she does not currently use alcohol. She reports that she does not use drugs.  Allergies  Allergen Reactions   Metronidazole  Other (See Comments)    C-Diff C-Diff C-Diff   Other Other (See Comments) and Rash    Dermabond - redness and burning, blistering Dermabond - redness and burning, blistering Dermabond - redness and burning, blistering burning skin   Tape Rash    burning skin burning skin    Family History  Problem Relation Age of Onset   Hypertension Father    Stomach cancer Father        may have been colon, diagnosed in his 55s   Depression Mother    Dementia Mother    Thyroid disease Sister    Heart disease Sister    Brain cancer Maternal Aunt 40   Cancer Maternal Grandmother        undetermined type, diagnosed in her 53s   Bone cancer Paternal Grandfather 13   Uterine cancer Paternal Aunt 84   Uterine cancer Cousin         paternal 1st cousin, diagnosed in her late 61s   Uterine cancer Cousin 41       paternal 1st cousin     Prior to Admission medications   Medication Sig Start Date End Date Taking? Authorizing Provider  dexamethasone  (DECADRON ) 4 MG tablet Take 1 tablet (4 mg total) by mouth daily. Take 1 tablet day chemotherapy 1 tablet 2 days after chemo with food 01/13/23   Gudena, Vinay, MD  docusate sodium  (COLACE) 100 MG capsule Take 1 capsule (100 mg total) by mouth daily. 04/09/23   Gudena, Vinay, MD  furosemide  (LASIX ) 20 MG tablet TAKE 1 TABLET BY MOUTH EVERY DAY WITH BREAKFAST 07/07/23   Odean Potts, MD  LORazepam  (ATIVAN ) 0.5 MG tablet Take 1 tablet (0.5 mg  total) by mouth every 8 (eight) hours as needed. for anxiety 07/23/23   Odean Potts, MD  omeprazole  (PRILOSEC) 20 MG capsule TAKE 1 CAPSULE BY MOUTH 2 (TWO) TIMES DAILY BEFORE A MEAL. 04/21/23   Hope Almarie ORN, NP  ondansetron  (ZOFRAN ) 8 MG tablet Take 1 tablet (8 mg total) by mouth every 8 (eight) hours as needed for nausea or vomiting. 11/03/22   Gudena, Vinay, MD  spironolactone  (ALDACTONE ) 25 MG tablet TAKE 1 TABLET (25 MG TOTAL) BY MOUTH DAILY. 07/08/23   Gudena, Vinay, MD  valsartan  (DIOVAN ) 40 MG tablet Take 40 mg by mouth at bedtime. 07/13/23   [provider]  venlafaxine  XR (EFFEXOR -XR) 37.5 MG 24 hr capsule Take 1 capsule (37.5 mg total) by mouth daily. 04/02/23   Odean Potts, MD    Physical Exam: Vitals:   09/02/23 1535 09/02/23 1604 09/02/23 2036 09/02/23 2100  BP:    124/60  Pulse:    (!) 104  Resp:    (!) 28  Temp: 98.3 F (36.8 C)  98.1 F (36.7 C) 98.1 F (36.7 C)  TempSrc: Oral  Oral Oral  SpO2:  95%  95%   Constitutional: Resting in bed.  NAD, calm, comfortable Eyes: EOMI, lids and conjunctivae normal ENMT: Mucous membranes are moist. Posterior pharynx clear of any exudate or lesions.Normal dentition.  Neck: normal, supple, no masses. Respiratory: Inspiratory crackle right lung base. Normal respiratory  effort while on 3 L O2 via [place]. No accessory muscle use.  Cardiovascular: Regular rate and rhythm, no murmurs / rubs / gallops. No extremity edema. 2+ pedal pulses.  Port-A-Cath in place right upper chest. Abdomen: no tenderness, no masses palpated.  Musculoskeletal: no clubbing / cyanosis. No joint deformity upper and lower extremities. Good ROM, no contractures. Normal muscle tone.  Skin: no rashes, lesions, ulcers. No induration Neurologic: Sensation intact. Strength 5/5 in all 4.  Psychiatric: Normal judgment and insight. Alert and oriented x 3. Normal mood.   EKG: Personally reviewed. Sinus rhythm, rate 95, LAFB, no acute ischemic change.  Not significantly changed when compared to previous.  Assessment/Plan Principal Problem:   Acute respiratory failure with hypoxia (HCC) Active Problems:   Community acquired pneumonia of right lower lobe of lung   Malignant neoplasm of lower-inner quadrant of right breast of female, estrogen receptor positive (HCC)   Essential hypertension   Theresa [name] is a 68 y.o. 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  Assessment and Plan: Acute hypoxic respiratory failure due to right lower lobe pneumonia: New hypoxia with SpO2 down to 81% on room air while ambulating.  CTA chest negative for PE but shows early right lower lobe pneumonia.  COVID, flu, influenza negative. -Continue IV ceftriaxone  and azithromycin  -Check strep pneumonia urinary antigen, follow blood cultures -IS, FV, Mucinex  -Continue supplemental O2 as needed  Stage IV right breast cancer metastatic to bone and liver: Follows with oncology Dr. Gudena on active treatment with Enhertu .  Hypertension: Normotensive on admission, can resume antihypertensives as necessary.  Anxiety: Continue Effexor  and Ativan  0.5 mg q8h as  needed.  Thrombocytopenia: Mild without obvious bleeding.  Likely secondary to Enhertu .    DVT prophylaxis: enoxaparin  (LOVENOX ) injection 40 mg Start: 09/02/23 2200 Code Status: Full code, confirmed with patient on admission Family Communication: Spouse at bedside Disposition Plan: From home and likely discharge to home pending clinical  progress Consults called: None Severity of Illness: The appropriate patient status for this patient is INPATIENT. Inpatient status is judged to be reasonable and necessary in order to provide the required intensity of service to ensure the patient's safety. The patient's presenting symptoms, physical exam findings, and initial radiographic and laboratory data in the context of their chronic comorbidities is felt to place them at high risk for further clinical deterioration. Furthermore, it is not anticipated that the patient will be medically stable for discharge from the hospital within 2 midnights of admission.   * I certify that at the point of admission it is my clinical judgment that the patient will require inpatient hospital care spanning beyond 2 midnights from the point of admission due to high intensity of service, high risk for further deterioration and high frequency of surveillance required.DEWAINE Jorie Blanch MD [facility]  If 7PM-7AM, please contact night-coverage [email or web address]  09/02/2023, 9:27 PM

## 2023-09-03 ENCOUNTER — Other Ambulatory Visit: Payer: Self-pay

## 2023-09-03 ENCOUNTER — Encounter (HOSPITAL_COMMUNITY): Payer: Self-pay | Admitting: Internal Medicine

## 2023-09-03 DIAGNOSIS — J9601 Acute respiratory failure with hypoxia: Secondary | ICD-10-CM | POA: Diagnosis not present

## 2023-09-03 LAB — CBC
HCT: 31.5 % — ABNORMAL LOW (ref 36.0–46.0)
Hemoglobin: 10.9 g/dL — ABNORMAL LOW (ref 12.0–15.0)
MCH: 37.6 pg — ABNORMAL HIGH (ref 26.0–34.0)
MCHC: 34.6 g/dL (ref 30.0–36.0)
MCV: 108.6 fL — ABNORMAL HIGH (ref 80.0–100.0)
Platelets: 109 10*3/uL — ABNORMAL LOW (ref 150–400)
RBC: 2.9 MIL/uL — ABNORMAL LOW (ref 3.87–5.11)
RDW: 17 % — ABNORMAL HIGH (ref 11.5–15.5)
WBC: 5.9 10*3/uL (ref 4.0–10.5)
nRBC: 0 % (ref 0.0–0.2)

## 2023-09-03 LAB — BASIC METABOLIC PANEL
Anion gap: 7 (ref 5–15)
BUN: 8 mg/dL (ref 8–23)
CO2: 23 mmol/L (ref 22–32)
Calcium: 8.3 mg/dL — ABNORMAL LOW (ref 8.9–10.3)
Chloride: 105 mmol/L (ref 98–111)
Creatinine, Ser: 0.43 mg/dL — ABNORMAL LOW (ref 0.44–1.00)
GFR, Estimated: >60 mL/min (ref >60)
Glucose, Bld: 94 mg/dL (ref 70–99)
Potassium: 3.9 mmol/L (ref 3.5–5.1)
Sodium: 135 mmol/L (ref 135–145)

## 2023-09-03 LAB — LACTIC ACID, PLASMA: Lactic Acid, Venous: 1 mmol/L (ref 0.5–1.9)

## 2023-09-03 MED ORDER — HYDROCODONE BIT-HOMATROP MBR 5-1.5 MG/5ML PO SOLN
5.0000 mL | Freq: Four times a day (QID) | ORAL | Status: DC | PRN
Start: 2023-09-03 — End: 2023-09-07
  Administered 2023-09-04 – 2023-09-07 (×4): 5 mL via ORAL
  Filled 2023-09-03 (×4): qty 5

## 2023-09-03 MED ORDER — BENZONATATE 100 MG PO CAPS
200.0000 mg | ORAL_CAPSULE | Freq: Three times a day (TID) | ORAL | Status: DC | PRN
  Administered 2023-09-03 – 2023-09-07 (×8): 200 mg via ORAL
  Filled 2023-09-03 (×9): qty 2

## 2023-09-03 MED ORDER — IPRATROPIUM-ALBUTEROL 0.5-2.5 (3) MG/3ML IN SOLN
3.0000 mL | Freq: Three times a day (TID) | RESPIRATORY_TRACT | Status: DC
  Administered 2023-09-03: 3 mL via RESPIRATORY_TRACT
  Filled 2023-09-03: qty 3

## 2023-09-03 MED FILL — Fosaprepitant Dimeglumine For IV Infusion 150 MG (Base Eq): INTRAVENOUS | Qty: 5 | Status: AC

## 2023-09-03 NOTE — Plan of Care (Signed)
  Problem: Education: Goal: Knowledge of General Education information will improve Description: Including pain rating scale, medication(s)/side effects and non-pharmacologic comfort measures Outcome: Progressing   Problem: Health Behavior/Discharge Planning: Goal: Ability to manage health-related needs will improve Outcome: Progressing   Problem: Clinical Measurements: Goal: Ability to maintain clinical measurements within normal limits will improve Outcome: Progressing Goal: Will remain free from infection Outcome: Progressing Goal: Diagnostic test results will improve Outcome: Progressing Goal: Respiratory complications will improve Outcome: Progressing Goal: Cardiovascular complication will be avoided Outcome: Progressing   Problem: Activity: Goal: Risk for activity intolerance will decrease Outcome: Progressing   Problem: Nutrition: Goal: Adequate nutrition will be maintained Outcome: Progressing   Problem: Coping: Goal: Level of anxiety will decrease Outcome: Progressing   Problem: Elimination: Goal: Will not experience complications related to bowel motility Outcome: Progressing Goal: Will not experience complications related to urinary retention Outcome: Progressing   Problem: Pain Management: Goal: General experience of comfort will improve Outcome: Progressing

## 2023-09-03 NOTE — Progress Notes (Addendum)
 PROGRESS NOTE    Theresa [name]  FMW:[id number]  DOB: 27-Feb-1956  DOA: 09/02/2023 PCP: Sheryle Carwin, MD Outpatient Specialists:                                                                 Hospital course:       68 year old female with metastatic breast CA with mets to bone and liver was admitted yesterday for hypoxia.  She had had cough and O2 sats down to 81% with exertion.  No fevers or chills but she has had some malaise.  Workup in cancer center revealed questionable subtle opacity at right lung base and patient was admitted for management of CAP.  COVID, RSV are negative.  CTA negative for PE but does reveal patchy right base infiltrate.   Subjective:  Patient states her biggest problem is cough.  Notes that when she has a coughing fit she loses her breath and her O2 saturations go down.  She has found the Tessalon  Perles to be helpful so far.   Objective: Vitals:   09/03/23 0440 09/03/23 1049 09/03/23 1430 09/03/23 1538  BP: (!) 103/59 121/72 121/73   Pulse: 95 (!) 104 100   Resp: (!) 23 (!) 22 16   Temp:  98.3 F (36.8 C) 99.5 F (37.5 C)   TempSrc:  Oral Oral   SpO2: 97% 97% 97% 97%  Weight:  69.9 kg    Height:  5' 5 (1.651 m)      Intake/Output Summary (Last 24 hours) at 09/03/2023 1742 Last data filed at 09/03/2023 1300 Gross per 24 hour  Intake 350.74 ml  Output --  Net 350.74 ml   Filed Weights   09/03/23 1049  Weight: 69.9 kg     Exam:  General: Uncomfortable appearing patient sitting up in recliner with unlabored mild tachypnea with attentive husband at bedside Eyes: sclera anicteric, conjuctiva mild injection bilaterally CVS: S1-S2, regular  Respiratory:  decreased air entry bilaterally secondary to decreased inspiratory effort, rales at bases  GI: NABS, soft, NT  LE: Warm and well-perfused Neuro: A/O x 3,  grossly nonfocal.  Psych: patient is logical and coherent, judgement and insight appear normal, mood and affect appropriate to  situation.  Data Reviewed:  Basic Metabolic Panel: Recent Labs  Lab 09/02/23 1112 09/02/23 1516 09/03/23 0557  NA 135 134* 135  K 4.2 3.5 3.9  CL 102 105 105  CO2 26 22 23   GLUCOSE 107* 165* 94  BUN 10 9 8   CREATININE 0.76 0.53 0.43*  CALCIUM 9.6 8.7* 8.3*    CBC: Recent Labs  Lab 09/02/23 1112 09/02/23 1516 09/03/23 0557  WBC 6.9 5.4 5.9  NEUTROABS 4.7 3.6  --   HGB 12.2 12.2 10.9*  HCT 35.0* 36.4 31.5*  MCV 103.9* 109.3* 108.6*  PLT 123* 117* 109*     Scheduled Meds:  enoxaparin  (LOVENOX ) injection  40 mg Subcutaneous QHS   guaiFENesin   600 mg Oral BID   venlafaxine  XR  37.5 mg Oral Q breakfast   Continuous Infusions:  azithromycin  Stopped (09/02/23 2220)   cefTRIAXone  (ROCEPHIN )  IV Stopped (09/02/23 2140)     Assessment & Plan:   Acute hypoxic respiratory failure with RLL infiltrate Cough Continue ceftriaxone  and azithromycin  day #2 Tessalon  Perles and  Mucinex  for cough Also order as needed Hycodan syrup Patient is found albuterol  to be helpful, will increase to 3 times daily CTA negative for PE, RSV, COVID are negative, strep pneumo antigen is ordered and pending  Metastatic breast to CA Ongoing treatment per oncology, Dr. Odean  HTN Patient is normotensive off of her usual antihypertensives  Anxiety and depression Effexor  and as needed Ativan      DVT prophylaxis: Lovenox  Code Status: Full Family Communication: Husband at bedside throughout     Studies: CT Angio Chest PE W and/or Wo Contrast Result Date: 09/02/2023 CLINICAL DATA:  Cough and history of breast carcinoma, initial encounter EXAM: CT ANGIOGRAPHY CHEST WITH CONTRAST TECHNIQUE: Multidetector CT imaging of the chest was performed using the standard protocol during bolus administration of intravenous contrast. Multiplanar CT image reconstructions and MIPs were obtained to evaluate the vascular anatomy. RADIATION DOSE REDUCTION: This exam was performed according to the  departmental dose-optimization program which includes automated exposure control, adjustment of the mA and/or kV according to patient size and/or use of iterative reconstruction technique. CONTRAST:  75mL OMNIPAQUE  IOHEXOL  350 MG/ML SOLN COMPARISON:  06/07/2023 FINDINGS: Cardiovascular: Thoracic aorta is within normal limits without aneurysmal dilatation or dissection. Heart is mildly enlarged in size. Right chest wall port is noted with catheter extending into the right atrium. The pulmonary artery shows a normal branching pattern bilaterally. No intraluminal filling defect to suggest pulmonary embolism is seen. No significant coronary calcifications are noted. Mediastinum/Nodes: Thoracic inlet is within normal limits. No hilar or mediastinal adenopathy is noted. The esophagus as visualized is within normal limits. Lungs/Pleura: Apical pleural and parenchymal scarring is noted particularly on the right. Patchy infiltrate is noted in the right lower lobe medially. No sizable effusion is seen. Upper Abdomen: Mild nodularity to the liver is noted similar to that seen on prior PET-CT. Gallbladder has been surgically removed. Very mild ascites is noted stable from the prior study. Musculoskeletal: Changes consistent with prior vertebral augmentation are again seen at T9. Mild inferior compression deformity is noted at T10 stable from the prior exams. Review of the MIP images confirms the above findings. IMPRESSION: No evidence of pulmonary emboli. Patchy infiltrate in the right lower lobe consistent with early pneumonia. Mild nodularity to the liver similar to that seen on prior PET-CT. Electronically Signed   By: Oneil Devonshire M.D.   On: 09/02/2023 17:48   DG Chest 2 View Result Date: 09/02/2023 CLINICAL DATA:  Cough for several days.  History of breast cancer EXAM: CHEST - 2 VIEW COMPARISON:  X-ray 01/15/2023. PET-CT 06/07/2023. FINDINGS: No consolidation, pneumothorax or effusion. Questionable subtle opacity in the  right lung base. No edema. Normal cardiopericardial silhouette. Right IJ chest port with the tip along the SVC right atrial junction region. Surgical changes along the right hemithorax and right breast. Degenerative changes of the spine. There is augmentation cement along a lower thoracic spine level which has some mild height loss. Similar to previous. Surgical clips in the upper abdomen. Punctate dense right upper lobe lung nodule as well, unchanged from previous. Please correlate for a calcified nodule. IMPRESSION: Chest port. Question subtle opacity in the right lung base. Recommend follow-up Electronically Signed   By: Ranell Bring M.D.   On: 09/02/2023 12:31    Principal Problem:   Acute respiratory failure with hypoxia [facility]) Active Problems:   Community acquired pneumonia of right lower lobe of lung   Malignant neoplasm of lower-inner quadrant of right breast of female, estrogen receptor positive (HCC)  Essential hypertension     Ivan Vangie Pike, [facility]  If 7PM-7AM, please contact night-coverage [email or web address]   LOS: 1 day

## 2023-09-03 NOTE — Telephone Encounter (Signed)
 Refill sent per patient's request

## 2023-09-04 DIAGNOSIS — J9601 Acute respiratory failure with hypoxia: Secondary | ICD-10-CM | POA: Diagnosis not present

## 2023-09-04 MED ORDER — ALUM & MAG HYDROXIDE-SIMETH 200-200-20 MG/5ML PO SUSP
30.0000 mL | ORAL | Status: DC | PRN
  Administered 2023-09-04 – 2023-09-06 (×3): 30 mL via ORAL
  Filled 2023-09-04 (×3): qty 30

## 2023-09-04 MED ORDER — IPRATROPIUM-ALBUTEROL 0.5-2.5 (3) MG/3ML IN SOLN
3.0000 mL | Freq: Three times a day (TID) | RESPIRATORY_TRACT | Status: AC
  Administered 2023-09-04 – 2023-09-05 (×5): 3 mL via RESPIRATORY_TRACT
  Filled 2023-09-04 (×6): qty 3

## 2023-09-04 NOTE — Progress Notes (Signed)
 PROGRESS NOTE    Theresa [name]  FMW:[id number]  DOB: 07/19/1956  DOA: 09/02/2023 PCP: Sheryle Carwin, MD Outpatient Specialists:                                                                 Hospital course:       68 year old female with metastatic breast CA with mets to bone and liver was admitted yesterday for hypoxia.  She had had cough and O2 sats down to 81% with exertion.  No fevers or chills but she has had some malaise.  Workup in cancer center revealed questionable subtle opacity at right lung base and patient was admitted for management of CAP.  COVID, RSV are negative.  CTA negative for PE but does reveal patchy right base infiltrate.   Subjective:  Patient states she feels much better.  Tessalon  Perles have been helpful.  Overall feels improved.   Objective: Vitals:   09/03/23 2304 09/04/23 0525 09/04/23 1130 09/04/23 1237  BP: 104/60 121/70  125/76  Pulse: (!) 104 (!) 105  (!) 110  Resp: 20 19  18   Temp: 98.4 F (36.9 C) 99.1 F (37.3 C)  97.9 F (36.6 C)  TempSrc: Oral Oral  Oral  SpO2: 98% 97% 93% 99%  Weight:      Height:        Intake/Output Summary (Last 24 hours) at 09/04/2023 1648 Last data filed at 09/04/2023 1236 Gross per 24 hour  Intake 480 ml  Output --  Net 480 ml   Filed Weights   09/03/23 1049  Weight: 69.9 kg     Exam:  General: Patient in good spirits sitting up in recliner talking on the phone and an ARD Eyes: sclera anicteric, conjuctiva mild injection bilaterally CVS: S1-S2, regular  Respiratory:  decreased air entry bilaterally secondary to decreased inspiratory effort, rales at bases  GI: NABS, soft, NT  LE: Warm and well-perfused Neuro: A/O x 3,  grossly nonfocal.  Psych: patient is logical and coherent, judgement and insight appear normal, mood and affect appropriate to situation.  Data Reviewed:  Basic Metabolic Panel: Recent Labs  Lab 09/02/23 1112 09/02/23 1516 09/03/23 0557  NA 135 134* 135  K 4.2 3.5 3.9  CL  102 105 105  CO2 26 22 23   GLUCOSE 107* 165* 94  BUN 10 9 8   CREATININE 0.76 0.53 0.43*  CALCIUM 9.6 8.7* 8.3*    CBC: Recent Labs  Lab 09/02/23 1112 09/02/23 1516 09/03/23 0557  WBC 6.9 5.4 5.9  NEUTROABS 4.7 3.6  --   HGB 12.2 12.2 10.9*  HCT 35.0* 36.4 31.5*  MCV 103.9* 109.3* 108.6*  PLT 123* 117* 109*     Scheduled Meds:  enoxaparin  (LOVENOX ) injection  40 mg Subcutaneous QHS   guaiFENesin   600 mg Oral BID   ipratropium-albuterol   3 mL Nebulization TID   venlafaxine  XR  37.5 mg Oral Q breakfast   Continuous Infusions:  azithromycin  500 mg (09/03/23 2214)   cefTRIAXone  (ROCEPHIN )  IV 2 g (09/03/23 2122)     Assessment & Plan:   Acute hypoxic respiratory failure with RLL infiltrate Cough Patient improving on present management Continue ceftriaxone  and azithromycin  day #3 Tessalon  Perles and Mucinex  for cough Inhaled bronchodilators as ordered Will attempt to  wean oxygen as tolerated CTA negative for PE, RSV, COVID are negative, strep pneumo antigen is ordered and pending  Metastatic breast to CA Ongoing treatment per oncology, Dr. Odean  HTN Patient is normotensive off of her usual antihypertensives  Anxiety and depression Effexor  and as needed Ativan      DVT prophylaxis: Lovenox  Code Status: Full Family Communication: Husband at bedside throughout     Studies: No results found.   Principal Problem:   Acute respiratory failure with hypoxia (HCC) Active Problems:   Community acquired pneumonia of right lower lobe of lung   Malignant neoplasm of lower-inner quadrant of right breast of female, estrogen receptor positive (HCC)   Essential hypertension     Ruchy Wildrick Vangie Pike, [facility]  If 7PM-7AM, please contact night-coverage [email or web address]   LOS: 2 days

## 2023-09-04 NOTE — Plan of Care (Signed)

## 2023-09-05 DIAGNOSIS — J9601 Acute respiratory failure with hypoxia: Secondary | ICD-10-CM | POA: Diagnosis not present

## 2023-09-05 MED ORDER — AZITHROMYCIN 250 MG PO TABS
500.0000 mg | ORAL_TABLET | Freq: Every day | ORAL | Status: AC
  Administered 2023-09-05 – 2023-09-06 (×2): 500 mg via ORAL
  Filled 2023-09-05 (×2): qty 2

## 2023-09-05 MED ORDER — CHLORHEXIDINE GLUCONATE CLOTH 2 % EX PADS
6.0000 | MEDICATED_PAD | Freq: Every day | CUTANEOUS | Status: DC
  Administered 2023-09-06 – 2023-09-07 (×2): 6 via TOPICAL

## 2023-09-05 MED ORDER — SODIUM CHLORIDE 0.9% FLUSH: 10.0000 mL | INTRAVENOUS | Status: DC | PRN

## 2023-09-05 NOTE — Progress Notes (Signed)
 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  She had had cough and O2 sats down to 81% with exertion.  No fevers or chills but she has had some malaise.  Workup in cancer center revealed questionable subtle opacity at right lung base and patient was admitted for management of CAP.  COVID, RSV are negative.  CTA negative for PE but does reveal patchy right base infiltrate.   Subjective:  Patient states that she is feeling better, no longer needs oxygen while at rest but notes that her oxygen levels to go down significantly with even minimal ambulation.   Objective: Vitals:   09/05/23 0848 09/05/23 1312 09/05/23 1500 09/05/23 1517  BP:  111/68    Pulse:  (!) 113    Resp:  18    Temp:  98.8 F (37.1 C) 98.4 F (36.9 C)   TempSrc:  Oral    SpO2: 90% (!) 84% 93% (S) (!) 85%  Weight:      Height:        Intake/Output Summary (Last 24 hours) at 09/05/2023 1701 Last data filed at 09/05/2023 0700 Gross per 24 hour  Intake 1060 ml  Output --  Net 1060 ml   Filed Weights   09/03/23 1049  Weight: 69.9 kg     Exam:  General: Patient in good spirits sitting up in recliner in NAD Eyes: sclera anicteric, conjuctiva mild injection bilaterally CVS: S1-S2, regular  Respiratory:  decreased air entry bilaterally secondary to decreased inspiratory effort, rales at bases  GI: NABS, soft, NT  LE: Warm and well-perfused Neuro: A/O x 3,  grossly nonfocal.  Psych: patient is logical and coherent, judgement and insight appear normal, mood and affect appropriate to situation.  Data Reviewed:  Basic Metabolic Panel: Recent Labs  Lab 09/02/23 1112 09/02/23 1516 09/03/23 0557  NA 135 134* 135  K 4.2 3.5 3.9   CL 102 105 105  CO2 26 22 23   GLUCOSE 107* 165* 94  BUN 10 9 8   CREATININE 0.76 0.53 0.43*  CALCIUM 9.6 8.7* 8.3*    CBC: Recent Labs  Lab 09/02/23 1112 09/02/23 1516 09/03/23 0557  WBC 6.9 5.4 5.9  NEUTROABS 4.7 3.6  --   HGB 12.2 12.2 10.9*  HCT 35.0* 36.4 31.5*  MCV 103.9* 109.3* 108.6*  PLT 123* 117* 109*     Scheduled Meds:  azithromycin   500 mg Oral Daily   enoxaparin  (LOVENOX ) injection  40 mg Subcutaneous QHS   guaiFENesin   600 mg Oral BID   ipratropium-albuterol   3 mL Nebulization TID   venlafaxine  XR  37.5 mg Oral Q breakfast   Continuous Infusions:  cefTRIAXone  (ROCEPHIN )  IV 2 g (09/04/23 2046)     Assessment & Plan:   Acute hypoxic respiratory failure with RLL infiltrate Cough Patient improving on present management Continue ceftriaxone  and azithromycin  day #4 Tessalon  Perles and Mucinex  for cough Inhaled  bronchodilators as ordered Patient presently off oxygen at rest but desats to 85% with ambulation CTA negative for PE, RSV, COVID are negative, strep pneumo antigen is ordered and pending  Metastatic breast to CA Patient is concerned about infusion she was post to get with Dr. Gudena on Monday Secure chat sent to Dr. Odean to clarify plans for infusion  HTN Patient is normotensive off of her usual antihypertensives  Anxiety and depression Effexor  and as needed Ativan      DVT prophylaxis: Lovenox  Code Status: Full Family Communication: Husband at bedside throughout     Studies: No results found.   Principal Problem:   Acute respiratory failure with hypoxia (HCC) Active Problems:   Community acquired pneumonia of right lower lobe of lung   Malignant neoplasm of lower-inner quadrant of right breast of female, estrogen receptor positive (HCC)   Essential hypertension     Neri Samek Vangie Pike, [facility]  If 7PM-7AM, please contact night-coverage [email or web address]   LOS: 3 days

## 2023-09-05 NOTE — Progress Notes (Signed)
   09/05/23 1517  Oxygen Therapy/Pulse Ox  SpO2 (!) (S)  85 % (Encouraged pt to wear the 02. She hasn't wanted to put it on today, will try post neb tx.)

## 2023-09-05 NOTE — Plan of Care (Signed)
  Problem: Education: Goal: Knowledge of General Education information will improve Description: Including pain rating scale, medication(s)/side effects and non-pharmacologic comfort measures Outcome: Progressing   Problem: Health Behavior/Discharge Planning: Goal: Ability to manage health-related needs will improve Outcome: Progressing   Problem: Clinical Measurements: Goal: Ability to maintain clinical measurements within normal limits will improve Outcome: Progressing Goal: Will remain free from infection Outcome: Progressing Goal: Diagnostic test results will improve Outcome: Progressing Goal: Respiratory complications will improve Outcome: Progressing Goal: Cardiovascular complication will be avoided Outcome: Progressing   Problem: Nutrition: Goal: Adequate nutrition will be maintained Outcome: Progressing   Problem: Coping: Goal: Level of anxiety will decrease Outcome: Progressing   Problem: Elimination: Goal: Will not experience complications related to bowel motility Outcome: Progressing Goal: Will not experience complications related to urinary retention Outcome: Progressing   Problem: Pain Management: Goal: General experience of comfort will improve Outcome: Progressing

## 2023-09-06 ENCOUNTER — Inpatient Hospital Stay: Payer: Medicare HMO | Admitting: Hematology and Oncology

## 2023-09-06 ENCOUNTER — Inpatient Hospital Stay: Payer: Medicare HMO | Attending: Hematology and Oncology

## 2023-09-06 ENCOUNTER — Inpatient Hospital Stay: Payer: Medicare HMO

## 2023-09-06 ENCOUNTER — Other Ambulatory Visit: Payer: Self-pay

## 2023-09-06 DIAGNOSIS — J9601 Acute respiratory failure with hypoxia: Secondary | ICD-10-CM | POA: Diagnosis not present

## 2023-09-06 LAB — BASIC METABOLIC PANEL
Anion gap: 6 (ref 5–15)
BUN: 10 mg/dL (ref 8–23)
CO2: 24 mmol/L (ref 22–32)
Calcium: 8.8 mg/dL — ABNORMAL LOW (ref 8.9–10.3)
Chloride: 105 mmol/L (ref 98–111)
Creatinine, Ser: 0.45 mg/dL (ref 0.44–1.00)
GFR, Estimated: >60 mL/min (ref >60)
Glucose, Bld: 113 mg/dL — ABNORMAL HIGH (ref 70–99)
Potassium: 4 mmol/L (ref 3.5–5.1)
Sodium: 135 mmol/L (ref 135–145)

## 2023-09-06 LAB — CBC
HCT: 32.3 % — ABNORMAL LOW (ref 36.0–46.0)
Hemoglobin: 11 g/dL — ABNORMAL LOW (ref 12.0–15.0)
MCH: 37 pg — ABNORMAL HIGH (ref 26.0–34.0)
MCHC: 34.1 g/dL (ref 30.0–36.0)
MCV: 108.8 fL — ABNORMAL HIGH (ref 80.0–100.0)
Platelets: 126 10*3/uL — ABNORMAL LOW (ref 150–400)
RBC: 2.97 MIL/uL — ABNORMAL LOW (ref 3.87–5.11)
RDW: 16.6 % — ABNORMAL HIGH (ref 11.5–15.5)
WBC: 6.6 10*3/uL (ref 4.0–10.5)
nRBC: 0 % (ref 0.0–0.2)

## 2023-09-06 MED ORDER — IPRATROPIUM-ALBUTEROL 0.5-2.5 (3) MG/3ML IN SOLN
3.0000 mL | Freq: Three times a day (TID) | RESPIRATORY_TRACT | Status: DC
  Administered 2023-09-06 – 2023-09-07 (×3): 3 mL via RESPIRATORY_TRACT
  Filled 2023-09-06 (×3): qty 3

## 2023-09-06 NOTE — Plan of Care (Signed)

## 2023-09-06 NOTE — Progress Notes (Signed)
 HEMATOLOGY-ONCOLOGY PROGRESS NOTE  SUBJECTIVE: Patient has been admitted with hypoxia and suspicion for community-acquired pneumonia. Patient has history of metastatic breast cancer being on active therapy with Enhertu  and doing extremely well from that presented with worsening hypoxia.  Previously she used to require oxygen but she was slowly tapered off and discontinued.  She tells me that she is improving while she is in the hospital with the current antibiotic therapy.  Oncology History  Malignant neoplasm of lower-inner quadrant of right breast of female, estrogen receptor positive (HCC)  07/03/2019 Initial Diagnosis   right lower inner quadrant biopsy, for a clinicaly multiple T1c N0, stage IA invasive ductal carcinoma, grade 2, E-cadherin positive, strongly estrogen receptor positive but progesterone  receptor negative and HER-2 not amplified, with an MIB-1 of 5%             (a) breast MRI 08/07/2019 showed 2 additional suspicious areas in the right breast and 1 in the left breast              (b) biopsy of all 3 areas 08/21/2019 showed no malignancy   07/26/2019 -  Anti-estrogen oral therapy   tamoxifen  started neoadjuvantly 07/26/2019; to be continued for 10 years.  S/p TAH/BSO on 01/16/2020 with benign pathology   08/04/2019 Genetic Testing   Negative genetic testing:  No pathogenic variants detected on the [facility] Breast Cancer STAT panel or the Common Hereditary Cancers panel. The report date is 08/04/2019.  The STAT Breast cancer panel offered by [facility] includes sequencing and rearrangement analysis for the following 9 genes:  ATM, BRCA1, BRCA2, CDH1, CHEK2, PALB2, PTEN, STK11 and TP53.  The Common Hereditary Cancers Panel offered by [facility] includes sequencing and/or deletion duplication testing of the following 48 genes: APC, ATM, AXIN2, BARD1, BMPR1A, BRCA1, BRCA2, BRIP1, CDH1, CDK4, CDKN2A (p14ARF), CDKN2A (p16INK4a), CHEK2, CTNNA1, DICER1, EPCAM (Deletion/duplication testing only),  GREM1 (promoter region deletion/duplication testing only), KIT, MEN1, MLH1, MSH2, MSH3, MSH6, MUTYH, NBN, NF1, NHTL1, PALB2, PDGFRA, PMS2, POLD1, POLE, PTEN, RAD50, RAD51C, RAD51D, RNF43, SDHB, SDHC, SDHD, SMAD4, SMARCA4. STK11, TP53, TSC1, TSC2, and VHL.  The following genes were evaluated for sequence changes only: SDHA and HOXB13 c.251G>A variant only.    10/04/2019 Cancer Staging   Staging form: Breast, AJCC 8th Edition - Pathologic stage from 10/04/2019: Stage IIB (pT2, pN1a(sn), cM0, G2, ER+, PR-, HER2-) - Signed by Crawford Morna Pickle, NP on 10/18/2019   10/04/2019 Surgery    right lumpectomy and sentinel lymph node sampling 10/04/2019 showed a pT2 pN1, stage IIB invasive ductal carcinoma, grade 2, with positive lymphovascular invasion and a positive superior margin             (a) 3 sentinel lymph nodes removed, one with macro metastatic deposit, 1 with a micrometastatic deposit             (b) additional surgery for margin clearance   10/04/2019 Miscellaneous    MammaPrint on the 10/04/2019 sample shows a low risk luminal A tumor predicting a 5-year metastasis free survival of 96% without chemotherapy, and a chemotherapy benefit of less than 1.5%    11/27/2019 - 01/05/2020 Radiation Therapy    Site Technique Total Dose (Gy) Dose per Fx (Gy) Completed Fx Beam Energies  Breast, Right: Breast_Rt 3D 50/50 2 25/25 6X, 10X  Breast, Right: Breast_Rt_SCV_PAB 3D 50/50 2 25/25 6X, 10X  Breast, Right: Breast_Rt_Bst 3D 10/10 2 5/5 6X, 10X     09/29/2022 Imaging   IMPRESSION: 1. Surgical changes involving the right medial breast.  No obvious recurrent breast mass or chest wall mass. 2. Mediastinal and hilar lymphadenopathy suggesting metastatic disease. 3. Patchy tree-in-bud type nodularity in the lungs suggesting chronic inflammation or atypical infection such as MAC. No definite pulmonary metastatic nodules. 4. Large partially necrotic mass involving most of the right hepatic lobe and a 2.5 cm  lesion in segment 3 of the liver consistent with metastatic disease. 5. 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   10/06/2022 Initial Biopsy   Liver biopsy: + for malignancy, metastatic poorly differentiated carcinoma, likely breast origin, biomarkers pending, insufficient tissue for molecular testing. (Resulted in EPIC on 10/13/2022)   10/07/2022 PET scan   PET scan on 10/07/2022 that showed hypermetabolic metastatic breast cancer with hypermetabolic mediastinal and hilar adenopathy, hypermetabolic hepatic metastatic disease and bone disease.  There were no findings for pulmonary metastatic disease.   10/21/2022 -  Anti-estrogen oral therapy   Verzinio and Letrozole    01/15/2023 -  Chemotherapy   Patient is on Treatment Plan : BREAST METASTATIC Fam-Trastuzumab Deruxtecan-nxki  (Enhertu ) (5.4) q21d     02/19/2023 Cancer Staging   Staging form: Breast, AJCC 8th Edition - Pathologic: Stage IV (pM1) - Signed by Crawford Morna Pickle, NP on 02/19/2023   Cancer, metastatic to bone (HCC)  12/21/2022 Initial Diagnosis   Cancer, metastatic to bone (HCC)   01/15/2023 -  Chemotherapy   Patient is on Treatment Plan : BREAST METASTATIC Fam-Trastuzumab Deruxtecan-nxki  (Enhertu ) (5.4) q21d       OBJECTIVE: REVIEW OF SYSTEMS:   Shortness of breath requiring oxygen  PHYSICAL EXAMINATION: ECOG PERFORMANCE STATUS: 1 - Symptomatic but completely ambulatory  Vitals:   09/06/23 0531 09/06/23 1210  BP: 108/63 133/78  Pulse: (!) 109 (!) 116  Resp: 18 16  Temp: 98.2 F (36.8 C) 98.3 F (36.8 C)  SpO2: 91% 96%   Filed Weights   09/03/23 1049  Weight: 154 lb (69.9 kg)      LABORATORY DATA:  I have reviewed the data as listed    Latest Ref Rng & Units 09/06/2023    3:05 AM 09/03/2023    5:57 AM 09/02/2023    3:16 PM  CMP  Glucose 70 - 99 mg/dL 886  94  834   BUN 8 - 23 mg/dL 10  8  9    Creatinine 0.44 - 1.00 mg/dL 9.54  9.56  9.46   Sodium 135 - 145  mmol/L 135  135  134   Potassium 3.5 - 5.1 mmol/L 4.0  3.9  3.5   Chloride 98 - 111 mmol/L 105  105  105   CO2 22 - 32 mmol/L 24  23  22    Calcium 8.9 - 10.3 mg/dL 8.8  8.3  8.7     Lab Results  Component Value Date   WBC 6.6 09/06/2023   HGB 11.0 (L) 09/06/2023   HCT 32.3 (L) 09/06/2023   MCV 108.8 (H) 09/06/2023   PLT 126 (L) 09/06/2023   NEUTROABS 3.6 09/02/2023    ASSESSMENT AND PLAN: 1.  Hypoxia: Possibly related to community-acquired pneumonia.  Being managed with antibiotic use.  CT PE protocol: 09/02/2023: Patchy infiltrate right lower lobe consistent with early pneumonia. 2. metastatic breast cancer: On Enhertu : I do not believe that the lung changes are related to any interstitial lung disease from Enhertu .  We will continue to watch and monitor her progress. I might have to repeat another CT scan in a month to assess if Enhertu  is the cause of  her symptoms.  I will delay her chemotherapy by week and we will continue her treatment next week on.

## 2023-09-06 NOTE — Progress Notes (Signed)
 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 She had had cough and O2 sats down to 81% with exertion. No fevers or chills but she has had some malaise. Workup in cancer center revealed questionable subtle opacity at right lung base and patient was admitted for management of CAP. COVID, RSV are negative. CTA negative for PE but does reveal patchy right base infiltrate.Patient was admitted and being treated with for acute hypoxic aspiratory failure with RLL infiltrates, cough.   Subjective: Seen and examined on 1.5l husband at bedside Some cough dry- but feels overall better, no chest pain Overnight mild tachycardia in the 1 teens, hypoxic up to 85% on room air 1/5 at 3 PM currently on 1.5 L overnight and is being weaned down Labs showed stable BMP, CBC with chronic anemia macrocyclic, with thrombocytopenia which is improving.  Assessment and Plan: Principal Problem:   Acute respiratory failure with hypoxia (HCC) Active Problems:   Community acquired pneumonia of right lower lobe of lung   Malignant neoplasm of lower-inner quadrant of right breast of female, estrogen receptor positive (HCC)   Essential hypertension   Acute hypoxic respiratory failure with RLL infiltrate/Pneumonia: CTA negative for PE, RSV, COVID are negative, strep pneumo antigen is ordered and pending. Patient is clinically improving, continue ceftriaxone , azithromycin  day #5 bronchodilators supplemental oxygen.  Wean oxygen as tolerated if not able to wean may need home oxygen.     Metastatic breast to CA Discussed with Dr. Odean he is planning for chemo next week and holding this week due to pneumonia.     HTN BP stable off her usual meds for now   Thrombocytopenia Anemia: Appears chronic, monitor.  Anxiety and depression Mood  stable, continue Effexor  and as needed Ativan   DVT prophylaxis: enoxaparin  (LOVENOX ) injection 40 mg Start: 09/02/23 2200 Code Status:   Code Status: Full Code Family Communication: plan of care discussed with patient/husband at bedside. Patient status is: Remains hospitalized because of severity of illness Level of care: Med-Surg   Dispo: The patient is from: home            Anticipated disposition: home in 24 hrs once hypoxia resolves  Objective: Vitals last 24 hrs: Vitals:   09/05/23 2032 09/05/23 2149 09/06/23 0531 09/06/23 1210  BP: 119/76  108/63 133/78  Pulse:   (!) 109 (!) 116  Resp: 18  18 16   Temp: 99.4 F (37.4 C)  98.2 F (36.8 C) 98.3 F (36.8 C)  TempSrc: Oral  Oral Oral  SpO2: 95% 94% 91% 96%  Weight:      Height:       Weight change:   Physical Examination: General exam: alert awake,at baseline, older than stated age HEENT:Oral mucosa moist, Ear/Nose WNL grossly Respiratory system: Bilaterally clear BS,no use of accessory muscle Cardiovascular system: S1 & S2 +, No JVD. Gastrointestinal system: Abdomen soft,NT,ND, BS+ Nervous System: Alert, awake, moving all extremities,and following commands. Extremities: LE edema neg,distal peripheral pulses palpable and warm.  Skin: No rashes,no icterus. MSK: Normal muscle bulk,tone, power   Medications reviewed:  Scheduled Meds:  azithromycin   500 mg Oral Daily   Chlorhexidine  Gluconate Cloth  6 each Topical Daily   enoxaparin  (LOVENOX ) injection  40 mg Subcutaneous QHS   guaiFENesin   600 mg Oral BID   ipratropium-albuterol   3 mL Nebulization TID   venlafaxine  XR  37.5 mg Oral Q breakfast   Continuous Infusions:  cefTRIAXone  (ROCEPHIN )  IV 2 g (09/05/23 2145)    Diet Order             Diet regular Room service appropriate? Yes; Fluid consistency: Thin  Diet effective now                 No intake or output data in the 24 hours ending 09/06/23 1254 Net IO Since Admission: 1,890.74 mL [09/06/23 1254]   Wt Readings from Last 3 Encounters:  09/03/23 69.9 kg  09/02/23 70.2 kg  08/13/23 70.2 kg     Unresulted Labs (From admission, onward)     Start     Ordered   09/02/23 2004  Strep pneumoniae urinary antigen  (COPD / Pneumonia / Cellulitis / Lower Extremity Wound)  Once,   R        09/02/23 2005   09/02/23 2004  Expectorated Sputum Assessment w Gram Stain, Rflx to Resp Cult  (COPD / Pneumonia / Cellulitis / Lower Extremity Wound)  Once,   R        09/02/23 2005          Data Reviewed: I have personally reviewed following labs and imaging studies CBC: Recent Labs  Lab 09/02/23 1112 09/02/23 1516 09/03/23 0557 09/06/23 0305  WBC 6.9 5.4 5.9 6.6  NEUTROABS 4.7 3.6  --   --   HGB 12.2 12.2 10.9* 11.0*  HCT 35.0* 36.4 31.5* 32.3*  MCV 103.9* 109.3* 108.6* 108.8*  PLT 123* 117* 109* 126*   Basic Metabolic Panel:  Recent Labs  Lab 09/02/23 1112 09/02/23 1516 09/03/23 0557 09/06/23 0305  NA 135 134* 135 135  K 4.2 3.5 3.9 4.0  CL 102 105 105 105  CO2 26 22 23 24   GLUCOSE 107* 165* 94 113*  BUN 10 9 8 10   CREATININE 0.76 0.53 0.43* 0.45  CALCIUM 9.6 8.7* 8.3* 8.8*   GFR: Estimated Creatinine Clearance: 67.9 mL/min (by C-G formula based on SCr of 0.45 mg/dL). Liver Function Tests:  Recent Labs  Lab 09/02/23 1112  AST 34  ALT 13  ALKPHOS 147*  BILITOT 1.6*  PROT 6.1*  ALBUMIN 3.6  Sepsis Labs: Recent Labs  Lab 09/02/23 0140 09/02/23 2040  LATICACIDVEN 1.0 2.6*   Recent Results (from the past 240 hours)  Resp panel by RT-PCR (RSV, Flu A&B, Covid) Anterior Nasal Swab     Status: None   Collection Time: 09/02/23  3:18 PM   Specimen: Anterior Nasal Swab  Result Value Ref Range Status   SARS Coronavirus 2 by RT PCR NEGATIVE NEGATIVE Final    Comment: (NOTE) SARS-CoV-2 target nucleic acids are NOT DETECTED.  The SARS-CoV-2 RNA is generally detectable in upper respiratory specimens during the acute phase of infection. The lowest concentration of SARS-CoV-2  viral copies this assay can detect is 138 copies/mL. A negative result does not preclude SARS-Cov-2 infection and should not be used as the sole basis for treatment or other patient management decisions. A negative result may occur with  improper specimen collection/handling, submission of specimen other than nasopharyngeal swab, presence of viral mutation(s) within the areas targeted by this assay, and inadequate number of viral copies(<138 copies/mL). A negative result must be combined with clinical observations, patient history, and epidemiological information. The expected result is Negative.  Fact Sheet for Patients:  bloggercourse.com  Fact Sheet for Healthcare Providers:  seriousbroker.it  This test is no t yet approved or cleared by  the [place]  FDA and  has been authorized for detection and/or diagnosis of SARS-CoV-2 by FDA under an Emergency Use Authorization (EUA). This EUA will remain  in effect (meaning this test can be used) for the duration of the COVID-19 declaration under Section 564(b)(1) of the Act, 21 U.S.C.section 360bbb-3(b)(1), unless the authorization is terminated  or revoked sooner.       Influenza A by PCR NEGATIVE NEGATIVE Final   Influenza B by PCR NEGATIVE NEGATIVE Final    Comment: (NOTE) The Xpert Xpress SARS-CoV-2/FLU/RSV plus assay is intended as an aid in the diagnosis of influenza from Nasopharyngeal swab specimens and should not be used as a sole basis for treatment. Nasal washings and aspirates are unacceptable for Xpert Xpress SARS-CoV-2/FLU/RSV testing.  Fact Sheet for Patients: bloggercourse.com  Fact Sheet for Healthcare Providers: seriousbroker.it  This test is not yet approved or cleared by the [place]  FDA and has been authorized for detection and/or diagnosis of SARS-CoV-2 by FDA under an Emergency Use Authorization  (EUA). This EUA will remain in effect (meaning this test can be used) for the duration of the COVID-19 declaration under Section 564(b)(1) of the Act, 21 U.S.C. section 360bbb-3(b)(1), unless the authorization is terminated or revoked.     Resp Syncytial Virus by PCR NEGATIVE NEGATIVE Final    Comment: (NOTE) Fact Sheet for Patients: bloggercourse.com  Fact Sheet for Healthcare Providers: seriousbroker.it  This test is not yet approved or cleared by the [place]  FDA and has been authorized for detection and/or diagnosis of SARS-CoV-2 by FDA under an Emergency Use Authorization (EUA). This EUA will remain in effect (meaning this test can be used) for the duration of the COVID-19 declaration under Section 564(b)(1) of the Act, 21 U.S.C. section 360bbb-3(b)(1), unless the authorization is terminated or revoked.  Performed at [facility], [address], [place], [place] [address]   Blood culture (routine x 2)     Status: None (Preliminary result)   Collection Time: 09/02/23  8:40 PM   Specimen: BLOOD RIGHT ARM  Result Value Ref Range Status   Specimen Description   Final    BLOOD RIGHT ARM Performed at [facility], [address] [place], [place] [address]    Special Requests   Final    BOTTLES DRAWN AEROBIC AND ANAEROBIC Blood Culture adequate volume Performed at [facility], [address], [place], [place] [address]    Culture   Final    NO GROWTH 4 DAYS Performed at [facility], [address] [place], [place] [address]    Report Status PENDING  Incomplete  Blood culture (routine x 2)     Status: None (Preliminary result)   Collection Time: 09/02/23  8:55 PM   Specimen: BLOOD LEFT ARM  Result Value Ref Range Status   Specimen Description   Final    BLOOD LEFT ARM Performed at [facility], [address] [place], [place] [address]    Special Requests    Final    BOTTLES DRAWN AEROBIC AND ANAEROBIC Blood Culture results may not be optimal due to an inadequate volume of blood received in culture bottles Performed at [facility], [address] [place], [place] [address]    Culture   Final    NO GROWTH 4 DAYS Performed at [facility], [address] [place], [place] [address]    Report Status PENDING  Incomplete    Antimicrobials/Microbiology: Anti-infectives (  From admission, onward)    Start     Dose/Rate Route Frequency Ordered Stop   09/05/23 2100  azithromycin  (ZITHROMAX ) tablet 500 mg        500 mg Oral Daily 09/05/23 0810 09/07/23 2059   09/03/23 2000  cefTRIAXone  (ROCEPHIN ) 2 g in sodium chloride  0.9 % 100 mL IVPB  Status:  Discontinued        2 g 200 mL/hr over 30 Minutes Intravenous Every 24 hours 09/02/23 2005 09/02/23 2048   09/03/23 2000  azithromycin  (ZITHROMAX ) 500 mg in sodium chloride  0.9 % 250 mL IVPB  Status:  Discontinued        500 mg 250 mL/hr over 60 Minutes Intravenous Every 24 hours 09/02/23 2005 09/02/23 2048   09/02/23 2047  cefTRIAXone  (ROCEPHIN ) 2 g in sodium chloride  0.9 % 100 mL IVPB        2 g 200 mL/hr over 30 Minutes Intravenous Every 24 hours 09/02/23 2048 09/07/23 2059   09/02/23 2047  azithromycin  (ZITHROMAX ) 500 mg in sodium chloride  0.9 % 250 mL IVPB  Status:  Discontinued        500 mg 250 mL/hr over 60 Minutes Intravenous Every 24 hours 09/02/23 2048 09/05/23 0810   09/02/23 1800  cefTRIAXone  (ROCEPHIN ) 1 g in sodium chloride  0.9 % 100 mL IVPB  Status:  Discontinued        1 g 200 mL/hr over 30 Minutes Intravenous  Once 09/02/23 1756 09/02/23 2048   09/02/23 1800  azithromycin  (ZITHROMAX ) 500 mg in sodium chloride  0.9 % 250 mL IVPB  Status:  Discontinued        500 mg 250 mL/hr over 60 Minutes Intravenous  Once 09/02/23 1756 09/02/23 2048         Component Value Date/Time   SDES  09/02/2023 2055    BLOOD LEFT ARM Performed at [facility], [address] [place], [place] [address]    SPECREQUEST  09/02/2023 2055    BOTTLES DRAWN AEROBIC AND ANAEROBIC Blood Culture results may not be optimal due to an inadequate volume of blood received in culture bottles Performed at [facility], [address] [place], [place] [address]    CULT  09/02/2023 2055    NO GROWTH 4 DAYS Performed at [facility], [address], [place], [place] [address]    REPTSTATUS PENDING 09/02/2023 2055     Radiology Studies: No results found.  LOS: 4 days  Total time spent in review of labs and imaging, patient evaluation, formulation of plan, documentation and communication with family: 35 minutes  Mennie LAMY, MD [facility]  09/06/2023, 12:54 PM

## 2023-09-06 NOTE — Progress Notes (Signed)
 Mobility Specialist - Progress Note   09/06/23 1144  Mobility  Activity Ambulated independently in hallway  Level of Assistance Independent  Assistive Device None  Distance Ambulated (ft) 160 ft  Activity Response Tolerated well  Mobility Referral Yes  Mobility visit 1 Mobility  Mobility Specialist Start Time (ACUTE ONLY) 1133  Mobility Specialist Stop Time (ACUTE ONLY) 1143  Mobility Specialist Time Calculation (min) (ACUTE ONLY) 10 min   Pt received in recliner and agreeable to mobility. Upon entering room pt had [place] off. Pt agreeable to check SpO2 stats on RA. Pt desat to 65% during ambulation with no complaints. Assisted pt w/ putting [place] on, bringing SpO2 back up to 89%. Pt to recliner after session with all needs met. [place] left on.   [facility]

## 2023-09-07 DIAGNOSIS — J9601 Acute respiratory failure with hypoxia: Secondary | ICD-10-CM | POA: Diagnosis not present

## 2023-09-07 LAB — CULTURE, BLOOD (ROUTINE X 2)
Culture: NO GROWTH
Culture: NO GROWTH
Special Requests: ADEQUATE

## 2023-09-07 MED ORDER — GUAIFENESIN ER 600 MG PO TB12: 600.0000 mg | ORAL_TABLET | Freq: Two times a day (BID) | ORAL | 0 refills | Status: AC

## 2023-09-07 MED ORDER — AMOXICILLIN-POT CLAVULANATE 875-125 MG PO TABS: 1.0000 | ORAL_TABLET | Freq: Two times a day (BID) | ORAL | 0 refills | Status: AC

## 2023-09-07 MED ORDER — BENZONATATE 200 MG PO CAPS: 200.0000 mg | ORAL_CAPSULE | Freq: Three times a day (TID) | ORAL | 0 refills | Status: DC | PRN

## 2023-09-07 MED ORDER — HEPARIN SOD (PORK) LOCK FLUSH 100 UNIT/ML IV SOLN
500.0000 [IU] | INTRAVENOUS | Status: AC | PRN
  Administered 2023-09-07: 500 [IU]

## 2023-09-07 NOTE — Progress Notes (Signed)
 SATURATION QUALIFICATIONS: (This note is used to comply with regulatory documentation for home oxygen)  Patient Saturations on Room Air at Rest = 92%  Patient Saturations on Room Air while Ambulating = 84%  Patient Saturations on 2 Liters of oxygen while Ambulating = 91%  Please briefly explain why patient needs home oxygen: O2 sats drop while on room air.  Has O2 set up for home already.  Currently at bedside for discharge.

## 2023-09-07 NOTE — Plan of Care (Signed)

## 2023-09-07 NOTE — TOC CM/SW Note (Signed)
 Transition of Care [facility]) - Inpatient Brief Assessment   Patient Details  Name: Theresa [name] MRN: [id number] Date of Birth: 12/27/1955  Transition of Care [facility]) CM/SW Contact:    Dalila Camellia SAUNDERS, LCSW Phone Number: 09/07/2023, 12:29 PM   Clinical Narrative:  Patient has insurance and a PCP.  Patient does not have any SDOH needs, no anticipated TOC needs at this time.  Patient has home oxygen already set up.   Transition of Care Asessment: Insurance and Status: Insurance coverage has been reviewed Patient has primary care physician: Yes Home environment has been reviewed: Yes lives with spouse Prior level of function:: Indep with oxygen Prior/Current Home Services: No current home services Social Drivers of Health Review: SDOH reviewed no interventions necessary Readmission risk has been reviewed: Yes Transition of care needs: no transition of care needs at this time

## 2023-09-07 NOTE — Discharge Summary (Signed)
 Physician Discharge Summary  Theresa [name] FMW:[id number] DOB: Sep 07, 1955 DOA: 09/02/2023  PCP: Sheryle Carwin, MD  Admit date: 09/02/2023 Discharge date: 09/07/2023 Recommendations for Outpatient Follow-up:  Follow up with PCP in 1 weeks-call for appointment Please obtain BMP/CBC in one week Follow-up with Dr. Gudena  Discharge Dispo: Home Discharge Condition: Stable Code Status:   Code Status: Full Code Diet recommendation:  Diet Order             Diet regular Room service appropriate? 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 She had had cough and O2 sats down to 81% with exertion. No fevers or chills but she has had some malaise. Workup in cancer center revealed questionable subtle opacity at right lung base and patient was admitted for management of CAP. COVID, RSV are negative. CTA negative for PE but does reveal patchy right base infiltrate.Patient was admitted and being treated with for acute hypoxic aspiratory failure with RLL infiltrates, cough. Patient was treated with IV antibiotics overall clinically improved remains afebrile however she is needing oxygen, will check for home oxygen need, at this time she is stable for discharge home.  Oncology has been made aware and will follow-up next week for her infusion   Discharge Diagnoses:  Principal Problem:   Acute respiratory failure with hypoxia (HCC) Active Problems:   Community acquired pneumonia of right lower lobe of lung   Malignant neoplasm of lower-inner quadrant of right breast of female, estrogen receptor positive (HCC)   Essential hypertension   Acute hypoxic respiratory failure with RLL infiltrate/Pneumonia: CTA negative for PE, RSV, COVID are negative, strep pneumo antigen is ordered and pending. Patient is clinically improving, patient received ceftriaxone , azithromycin   day #5 bronchodilators supplemental oxygen.  Currently doing well on 2 L nasal cannula on ambulation at rest she is doing well on room air.  She has home oxygen set up-husband bring portable oxygen for discharge home today will discharge with few more days of oral antibiotics she will follow-up with oncology next week   Metastatic breast to CA Discussed with Dr. Odean he is planning for chemo next week and holding this week due to pneumonia.     HTN BP stable off her usual meds for now -resume discharge   Thrombocytopenia Anemia: Appears chronic, monitor.   Anxiety and depression Mood stable, continue Effexor  and as needed Ativan   Consults: oncology Subjective: Patient is alert awake requesting for discharge today she did well on 2 L oxygen when she ambulated  Discharge Exam: Vitals:   09/07/23 0434 09/07/23 0856  BP: 115/65   Pulse: 92   Resp: 18   Temp: 98.1 F (36.7 C)   SpO2: 92% (!) 89%   General: Pt is alert, awake, not in acute distress Cardiovascular: RRR, S1/S2 +, no rubs, no gallops Respiratory: CTA bilaterally, no wheezing, no rhonchi Abdominal: Soft, NT, ND, bowel sounds + Extremities: no edema, no cyanosis  Discharge Instructions  Discharge Instructions     Discharge instructions   Complete by: As directed    Please call call MD or return to ER for similar or worsening recurring problem that brought you to hospital or if any fever,nausea/vomiting,abdominal pain, uncontrolled pain, chest pain,  shortness of breath or any other alarming symptoms.  Please follow-up your doctor as  instructed in a week time and call the office for appointment.  Please avoid alcohol, smoking, or any other illicit substance and maintain healthy habits including taking your regular medications as prescribed.  You were cared for by a hospitalist during your hospital stay. If you have any questions about your discharge medications or the care you received while you were in the  hospital after you are discharged, you can call the unit and ask to speak with the hospitalist on call if the hospitalist that took care of you is not available.  Once you are discharged, your primary care physician will handle any further medical issues. Please note that NO REFILLS for any discharge medications will be authorized once you are discharged, as it is imperative that you return to your primary care physician (or establish a relationship with a primary care physician if you do not have one) for your aftercare needs so that they can reassess your need for medications and monitor your lab values   Increase activity slowly   Complete by: As directed       Allergies as of 09/07/2023       Reactions   Metronidazole  Other (See Comments)   C-Diff C-Diff C-Diff   Other Other (See Comments), Rash   Dermabond - redness and burning, blistering Dermabond - redness and burning, blistering Dermabond - redness and burning, blistering burning skin   Tape Rash   burning skin burning skin        Medication List     TAKE these medications    amoxicillin -clavulanate 875-125 MG tablet Commonly known as: AUGMENTIN  Take 1 tablet by mouth 2 (two) times daily for 3 days.   benzonatate  200 MG capsule Commonly known as: TESSALON  Take 1 capsule (200 mg total) by mouth 3 (three) times daily as needed for cough.   dexamethasone  4 MG tablet Commonly known as: DECADRON  TAKE 1 TABLET (4 MG TOTAL) BY MOUTH DAILY. TAKE 1 TABLET DAY CHEMOTHERAPY 1 TABLET 2 DAYS AFTER CHEMO WITH FOOD   docusate sodium  100 MG capsule Commonly known as: COLACE Take 1 capsule (100 mg total) by mouth daily. What changed:  when to take this reasons to take this   furosemide  20 MG tablet Commonly known as: LASIX  TAKE 1 TABLET BY MOUTH EVERY DAY WITH BREAKFAST   guaiFENesin  600 MG 12 hr tablet Commonly known as: MUCINEX  Take 1 tablet (600 mg total) by mouth 2 (two) times daily for 5 days.   LORazepam   0.5 MG tablet Commonly known as: ATIVAN  Take 1 tablet (0.5 mg total) by mouth every 8 (eight) hours as needed. for anxiety   omeprazole  20 MG capsule Commonly known as: PRILOSEC TAKE 1 CAPSULE BY MOUTH 2 (TWO) TIMES DAILY BEFORE A MEAL.   ondansetron  8 MG tablet Commonly known as: ZOFRAN  Take 1 tablet (8 mg total) by mouth every 8 (eight) hours as needed for nausea or vomiting.   spironolactone  25 MG tablet Commonly known as: ALDACTONE  TAKE 1 TABLET (25 MG TOTAL) BY MOUTH DAILY.   valsartan  40 MG tablet Commonly known as: DIOVAN  Take 40 mg by mouth at bedtime.   venlafaxine  XR 37.5 MG 24 hr capsule Commonly known as: EFFEXOR -XR Take 1 capsule (37.5 mg total) by mouth daily.        Follow-up Information     Sheryle Carwin, MD Follow up in 1 week(s).   Specialty: Internal Medicine Contact information: [address] [place] [address] [phone]  Allergies  Allergen Reactions   Metronidazole  Other (See Comments)    C-Diff C-Diff C-Diff   Other Other (See Comments) and Rash    Dermabond - redness and burning, blistering Dermabond - redness and burning, blistering Dermabond - redness and burning, blistering burning skin   Tape Rash    burning skin burning skin    The results of significant diagnostics from this hospitalization (including imaging, microbiology, ancillary and laboratory) are listed below for reference.    Microbiology: Recent Results (from the past 240 hours)  Resp panel by RT-PCR (RSV, Flu A&B, Covid) Anterior Nasal Swab     Status: None   Collection Time: 09/02/23  3:18 PM   Specimen: Anterior Nasal Swab  Result Value Ref Range Status   SARS Coronavirus 2 by RT PCR NEGATIVE NEGATIVE Final    Comment: (NOTE) SARS-CoV-2 target nucleic acids are NOT DETECTED.  The SARS-CoV-2 RNA is generally detectable in upper respiratory specimens during the acute phase of infection. The lowest concentration of  SARS-CoV-2 viral copies this assay can detect is 138 copies/mL. A negative result does not preclude SARS-Cov-2 infection and should not be used as the sole basis for treatment or other patient management decisions. A negative result may occur with  improper specimen collection/handling, submission of specimen other than nasopharyngeal swab, presence of viral mutation(s) within the areas targeted by this assay, and inadequate number of viral copies(<138 copies/mL). A negative result must be combined with clinical observations, patient history, and epidemiological information. The expected result is Negative.  Fact Sheet for Patients:  bloggercourse.com  Fact Sheet for Healthcare Providers:  seriousbroker.it  This test is no t yet approved or cleared by the [place]  FDA and  has been authorized for detection and/or diagnosis of SARS-CoV-2 by FDA under an Emergency Use Authorization (EUA). This EUA will remain  in effect (meaning this test can be used) for the duration of the COVID-19 declaration under Section 564(b)(1) of the Act, 21 U.S.C.section 360bbb-3(b)(1), unless the authorization is terminated  or revoked sooner.       Influenza A by PCR NEGATIVE NEGATIVE Final   Influenza B by PCR NEGATIVE NEGATIVE Final    Comment: (NOTE) The Xpert Xpress SARS-CoV-2/FLU/RSV plus assay is intended as an aid in the diagnosis of influenza from Nasopharyngeal swab specimens and should not be used as a sole basis for treatment. Nasal washings and aspirates are unacceptable for Xpert Xpress SARS-CoV-2/FLU/RSV testing.  Fact Sheet for Patients: bloggercourse.com  Fact Sheet for Healthcare Providers: seriousbroker.it  This test is not yet approved or cleared by the [place]  FDA and has been authorized for detection and/or diagnosis of SARS-CoV-2 by FDA under an Emergency Use  Authorization (EUA). This EUA will remain in effect (meaning this test can be used) for the duration of the COVID-19 declaration under Section 564(b)(1) of the Act, 21 U.S.C. section 360bbb-3(b)(1), unless the authorization is terminated or revoked.     Resp Syncytial Virus by PCR NEGATIVE NEGATIVE Final    Comment: (NOTE) Fact Sheet for Patients: bloggercourse.com  Fact Sheet for Healthcare Providers: seriousbroker.it  This test is not yet approved or cleared by the [place]  FDA and has been authorized for detection and/or diagnosis of SARS-CoV-2 by FDA under an Emergency Use Authorization (EUA). This EUA will remain in effect (meaning this test can be used) for the duration of the COVID-19 declaration under Section 564(b)(1) of the Act, 21 U.S.C. section 360bbb-3(b)(1), unless the authorization is terminated or revoked.  Performed at  [facility], [address], [place], [place] [address]   Blood culture (routine x 2)     Status: None   Collection Time: 09/02/23  8:40 PM   Specimen: BLOOD RIGHT ARM  Result Value Ref Range Status   Specimen Description   Final    BLOOD RIGHT ARM Performed at [facility], [address], [place], [place] [address]    Special Requests   Final    BOTTLES DRAWN AEROBIC AND ANAEROBIC Blood Culture adequate volume Performed at [facility], [address], [place], [place] [address]    Culture   Final    NO GROWTH 5 DAYS Performed at [facility], [address], [place], [place] [address]    Report Status 09/07/2023 FINAL  Final  Blood culture (routine x 2)     Status: None   Collection Time: 09/02/23  8:55 PM   Specimen: BLOOD LEFT ARM  Result Value Ref Range Status   Specimen Description   Final    BLOOD LEFT ARM Performed at [facility], [address], [place], [place] [address]    Special Requests   Final    BOTTLES  DRAWN AEROBIC AND ANAEROBIC Blood Culture results may not be optimal due to an inadequate volume of blood received in culture bottles Performed at [facility], [address], [place], [place] [address]    Culture   Final    NO GROWTH 5 DAYS Performed at [facility], [address], [place], [place] [address]    Report Status 09/07/2023 FINAL  Final    Procedures/Studies: CT Angio Chest PE W and/or Wo Contrast Result Date: 09/02/2023 CLINICAL DATA:  Cough and history of breast carcinoma, initial encounter EXAM: CT ANGIOGRAPHY CHEST WITH CONTRAST TECHNIQUE: Multidetector CT imaging of the chest was performed using the standard protocol during bolus administration of intravenous contrast. Multiplanar CT image reconstructions and MIPs were obtained to evaluate the vascular anatomy. RADIATION DOSE REDUCTION: This exam was performed according to the departmental dose-optimization program which includes automated exposure control, adjustment of the mA and/or kV according to patient size and/or use of iterative reconstruction technique. CONTRAST:  75mL OMNIPAQUE  IOHEXOL  350 MG/ML SOLN COMPARISON:  06/07/2023 FINDINGS: Cardiovascular: Thoracic aorta is within normal limits without aneurysmal dilatation or dissection. Heart is mildly enlarged in size. Right chest wall port is noted with catheter extending into the right atrium. The pulmonary artery shows a normal branching pattern bilaterally. No intraluminal filling defect to suggest pulmonary embolism is seen. No significant coronary calcifications are noted. Mediastinum/Nodes: Thoracic inlet is within normal limits. No hilar or mediastinal adenopathy is noted. The esophagus as visualized is within normal limits. Lungs/Pleura: Apical pleural and parenchymal scarring is noted particularly on the right. Patchy infiltrate is noted in the right lower lobe medially. No sizable effusion is seen. Upper Abdomen: Mild nodularity to the liver is  noted similar to that seen on prior PET-CT. Gallbladder has been surgically removed. Very mild ascites is noted stable from the prior study. Musculoskeletal: Changes consistent with prior vertebral augmentation are again seen at T9. Mild inferior compression deformity is noted at T10 stable from the prior exams. Review of the MIP images confirms the above findings. IMPRESSION: No evidence of pulmonary emboli. Patchy infiltrate in the right lower lobe consistent with early pneumonia. Mild nodularity to the liver similar to that seen on prior PET-CT. Electronically Signed   By: Oneil Evelyne HERO.D.  On: 09/02/2023 17:48   DG Chest 2 View Result Date: 09/02/2023 CLINICAL DATA:  Cough for several days.  History of breast cancer EXAM: CHEST - 2 VIEW COMPARISON:  X-ray 01/15/2023. PET-CT 06/07/2023. FINDINGS: No consolidation, pneumothorax or effusion. Questionable subtle opacity in the right lung base. No edema. Normal cardiopericardial silhouette. Right IJ chest port with the tip along the SVC right atrial junction region. Surgical changes along the right hemithorax and right breast. Degenerative changes of the spine. There is augmentation cement along a lower thoracic spine level which has some mild height loss. Similar to previous. Surgical clips in the upper abdomen. Punctate dense right upper lobe lung nodule as well, unchanged from previous. Please correlate for a calcified nodule. IMPRESSION: Chest port. Question subtle opacity in the right lung base. Recommend follow-up Electronically Signed   By: Ranell Bring M.D.   On: 09/02/2023 12:31    Labs: BNP (last 3 results) Recent Labs    01/11/23 1011 01/15/23 0554 09/02/23 1517  BNP 193.4* 446.5* 69.2   Basic Metabolic Panel: Recent Labs  Lab 09/02/23 1112 09/02/23 1516 09/03/23 0557 09/06/23 0305  NA 135 134* 135 135  K 4.2 3.5 3.9 4.0  CL 102 105 105 105  CO2 26 22 23 24   GLUCOSE 107* 165* 94 113*  BUN 10 9 8 10   CREATININE 0.76 0.53 0.43*  0.45  CALCIUM 9.6 8.7* 8.3* 8.8*   Liver Function Tests: Recent Labs  Lab 09/02/23 1112  AST 34  ALT 13  ALKPHOS 147*  BILITOT 1.6*  PROT 6.1*  ALBUMIN 3.6   No results for input(s): LIPASE, AMYLASE in the last 168 hours. No results for input(s): AMMONIA in the last 168 hours. CBC: Recent Labs  Lab 09/02/23 1112 09/02/23 1516 09/03/23 0557 09/06/23 0305  WBC 6.9 5.4 5.9 6.6  NEUTROABS 4.7 3.6  --   --   HGB 12.2 12.2 10.9* 11.0*  HCT 35.0* 36.4 31.5* 32.3*  MCV 103.9* 109.3* 108.6* 108.8*  PLT 123* 117* 109* 126*   Cardiac Enzymes: No results for input(s): CKTOTAL, CKMB, CKMBINDEX, TROPONINI in the last 168 hours. BNP: Invalid input(s): POCBNP CBG: No results for input(s): GLUCAP in the last 168 hours. D-Dimer No results for input(s): DDIMER in the last 72 hours. Hgb A1c No results for input(s): HGBA1C in the last 72 hours. Lipid Profile No results for input(s): CHOL, HDL, LDLCALC, TRIG, CHOLHDL, LDLDIRECT in the last 72 hours. Thyroid function studies No results for input(s): TSH, T4TOTAL, T3FREE, THYROIDAB in the last 72 hours.  Invalid input(s): FREET3 Anemia work up No results for input(s): VITAMINB12, FOLATE, FERRITIN, TIBC, IRON, RETICCTPCT in the last 72 hours. Urinalysis    Component Value Date/Time   COLORURINE AMBER (A) 01/13/2023 2315   APPEARANCEUR CLEAR 01/13/2023 2315   LABSPEC 1.026 01/13/2023 2315   PHURINE 5.0 01/13/2023 2315   GLUCOSEU NEGATIVE 01/13/2023 2315   HGBUR NEGATIVE 01/13/2023 2315   BILIRUBINUR NEGATIVE 01/13/2023 2315   BILIRUBINUR n 05/15/2016 0902   KETONESUR NEGATIVE 01/13/2023 2315   PROTEINUR 30 (A) 01/13/2023 2315   UROBILINOGEN negative 05/15/2016 0902   NITRITE NEGATIVE 01/13/2023 2315   LEUKOCYTESUR MODERATE (A) 01/13/2023 2315   Sepsis Labs Recent Labs  Lab 09/02/23 1112 09/02/23 1516 09/03/23 0557 09/06/23 0305  WBC 6.9 5.4 5.9 6.6    Microbiology Recent Results (from the past 240 hours)  Resp panel by RT-PCR (RSV, Flu A&B, Covid) Anterior Nasal Swab     Status: None   Collection Time:  09/02/23  3:18 PM   Specimen: Anterior Nasal Swab  Result Value Ref Range Status   SARS Coronavirus 2 by RT PCR NEGATIVE NEGATIVE Final    Comment: (NOTE) SARS-CoV-2 target nucleic acids are NOT DETECTED.  The SARS-CoV-2 RNA is generally detectable in upper respiratory specimens during the acute phase of infection. The lowest concentration of SARS-CoV-2 viral copies this assay can detect is 138 copies/mL. A negative result does not preclude SARS-Cov-2 infection and should not be used as the sole basis for treatment or other patient management decisions. A negative result may occur with  improper specimen collection/handling, submission of specimen other than nasopharyngeal swab, presence of viral mutation(s) within the areas targeted by this assay, and inadequate number of viral copies(<138 copies/mL). A negative result must be combined with clinical observations, patient history, and epidemiological information. The expected result is Negative.  Fact Sheet for Patients:  bloggercourse.com  Fact Sheet for Healthcare Providers:  seriousbroker.it  This test is no t yet approved or cleared by the [place]  FDA and  has been authorized for detection and/or diagnosis of SARS-CoV-2 by FDA under an Emergency Use Authorization (EUA). This EUA will remain  in effect (meaning this test can be used) for the duration of the COVID-19 declaration under Section 564(b)(1) of the Act, 21 U.S.C.section 360bbb-3(b)(1), unless the authorization is terminated  or revoked sooner.       Influenza A by PCR NEGATIVE NEGATIVE Final   Influenza B by PCR NEGATIVE NEGATIVE Final    Comment: (NOTE) The Xpert Xpress SARS-CoV-2/FLU/RSV plus assay is intended as an aid in the diagnosis of influenza  from Nasopharyngeal swab specimens and should not be used as a sole basis for treatment. Nasal washings and aspirates are unacceptable for Xpert Xpress SARS-CoV-2/FLU/RSV testing.  Fact Sheet for Patients: bloggercourse.com  Fact Sheet for Healthcare Providers: seriousbroker.it  This test is not yet approved or cleared by the [place]  FDA and has been authorized for detection and/or diagnosis of SARS-CoV-2 by FDA under an Emergency Use Authorization (EUA). This EUA will remain in effect (meaning this test can be used) for the duration of the COVID-19 declaration under Section 564(b)(1) of the Act, 21 U.S.C. section 360bbb-3(b)(1), unless the authorization is terminated or revoked.     Resp Syncytial Virus by PCR NEGATIVE NEGATIVE Final    Comment: (NOTE) Fact Sheet for Patients: bloggercourse.com  Fact Sheet for Healthcare Providers: seriousbroker.it  This test is not yet approved or cleared by the [place]  FDA and has been authorized for detection and/or diagnosis of SARS-CoV-2 by FDA under an Emergency Use Authorization (EUA). This EUA will remain in effect (meaning this test can be used) for the duration of the COVID-19 declaration under Section 564(b)(1) of the Act, 21 U.S.C. section 360bbb-3(b)(1), unless the authorization is terminated or revoked.  Performed at [facility], [address], [place], [place] [address]   Blood culture (routine x 2)     Status: None   Collection Time: 09/02/23  8:40 PM   Specimen: BLOOD RIGHT ARM  Result Value Ref Range Status   Specimen Description   Final    BLOOD RIGHT ARM Performed at [facility], [address] [place], [place] [address]    Special Requests   Final    BOTTLES DRAWN AEROBIC AND ANAEROBIC Blood Culture adequate volume Performed at [facility], [address] [place], [place] [address]    Culture   Final  NO GROWTH 5 DAYS Performed at [facility], [address], [place], [place] [address]    Report Status 09/07/2023 FINAL  Final  Blood culture (routine x 2)     Status: None   Collection Time: 09/02/23  8:55 PM   Specimen: BLOOD LEFT ARM  Result Value Ref Range Status   Specimen Description   Final    BLOOD LEFT ARM Performed at [facility], [address], [place], [place] [address]    Special Requests   Final    BOTTLES DRAWN AEROBIC AND ANAEROBIC Blood Culture results may not be optimal due to an inadequate volume of blood received in culture bottles Performed at [facility], [address] [place] [address] [place], [place] [address]    Culture   Final    NO GROWTH 5 DAYS Performed at [facility], [address], [place], [place] [address]    Report Status 09/07/2023 FINAL  Final    Time coordinating discharge: 25 minutes  SIGNED: Mennie LAMY, MD  [facility] 09/07/2023, 12:19 PM  If 7PM-7AM, please contact night-coverage [email or web address]

## 2023-09-09 ENCOUNTER — Telehealth: Payer: Self-pay | Admitting: *Deleted

## 2023-09-09 NOTE — Telephone Encounter (Signed)
 Pt called to inform clinic of discharge from hospital. Per Dr.Gudena, send scheduling message to have pt put on schedule for next week for office visit and infusion appt. Made pt aware that message has been sent for scheduling. Pt verbalized understanding.

## 2023-09-10 NOTE — Telephone Encounter (Signed)
 Called pt and scheduled treatment appts. Pt confirmed and agreed to appts date and times.

## 2023-09-14 MED FILL — Fosaprepitant Dimeglumine For IV Infusion 150 MG (Base Eq): INTRAVENOUS | Qty: 5 | Status: AC

## 2023-09-15 ENCOUNTER — Inpatient Hospital Stay (HOSPITAL_BASED_OUTPATIENT_CLINIC_OR_DEPARTMENT_OTHER): Payer: Medicare HMO | Admitting: Hematology and Oncology

## 2023-09-15 ENCOUNTER — Inpatient Hospital Stay: Payer: Medicare HMO

## 2023-09-15 ENCOUNTER — Encounter (HOSPITAL_COMMUNITY): Payer: Self-pay

## 2023-09-15 VITALS — BP 125/62 | HR 109 | Temp 97.8°F | Resp 18 | Ht 65.0 in | Wt 152.4 lb

## 2023-09-15 DIAGNOSIS — Z87891 Personal history of nicotine dependence: Secondary | ICD-10-CM | POA: Diagnosis not present

## 2023-09-15 DIAGNOSIS — C50311 Malignant neoplasm of lower-inner quadrant of right female breast: Secondary | ICD-10-CM

## 2023-09-15 DIAGNOSIS — C7951 Secondary malignant neoplasm of bone: Secondary | ICD-10-CM | POA: Diagnosis present

## 2023-09-15 DIAGNOSIS — Z170 Estrogen receptor positive status [ER+]: Secondary | ICD-10-CM

## 2023-09-15 DIAGNOSIS — Z923 Personal history of irradiation: Secondary | ICD-10-CM | POA: Diagnosis not present

## 2023-09-15 DIAGNOSIS — C419 Malignant neoplasm of bone and articular cartilage, unspecified: Secondary | ICD-10-CM

## 2023-09-15 DIAGNOSIS — Z79899 Other long term (current) drug therapy: Secondary | ICD-10-CM | POA: Diagnosis not present

## 2023-09-15 DIAGNOSIS — D649 Anemia, unspecified: Secondary | ICD-10-CM | POA: Diagnosis not present

## 2023-09-15 DIAGNOSIS — C787 Secondary malignant neoplasm of liver and intrahepatic bile duct: Secondary | ICD-10-CM | POA: Diagnosis not present

## 2023-09-15 DIAGNOSIS — R059 Cough, unspecified: Secondary | ICD-10-CM | POA: Diagnosis not present

## 2023-09-15 DIAGNOSIS — Z5112 Encounter for antineoplastic immunotherapy: Secondary | ICD-10-CM | POA: Diagnosis present

## 2023-09-15 LAB — CBC WITH DIFFERENTIAL (CANCER CENTER ONLY)
Abs Immature Granulocytes: 0.03 10*3/uL (ref 0.00–0.07)
Basophils Absolute: 0 10*3/uL (ref 0.0–0.1)
Basophils Relative: 1 %
Eosinophils Absolute: 0 10*3/uL (ref 0.0–0.5)
Eosinophils Relative: 0 %
HCT: 37.9 % (ref 36.0–46.0)
Hemoglobin: 13 g/dL (ref 12.0–15.0)
Immature Granulocytes: 0 %
Lymphocytes Relative: 10 %
Lymphs Abs: 0.8 10*3/uL (ref 0.7–4.0)
MCH: 36 pg — ABNORMAL HIGH (ref 26.0–34.0)
MCHC: 34.3 g/dL (ref 30.0–36.0)
MCV: 105 fL — ABNORMAL HIGH (ref 80.0–100.0)
Monocytes Absolute: 0.2 10*3/uL (ref 0.1–1.0)
Monocytes Relative: 3 %
Neutro Abs: 6.8 10*3/uL (ref 1.7–7.7)
Neutrophils Relative %: 86 %
Platelet Count: 101 10*3/uL — ABNORMAL LOW (ref 150–400)
RBC: 3.61 MIL/uL — ABNORMAL LOW (ref 3.87–5.11)
RDW: 15.7 % — ABNORMAL HIGH (ref 11.5–15.5)
WBC Count: 8 10*3/uL (ref 4.0–10.5)
nRBC: 0 % (ref 0.0–0.2)

## 2023-09-15 LAB — CMP (CANCER CENTER ONLY)
ALT: 18 U/L (ref 0–44)
AST: 48 U/L — ABNORMAL HIGH (ref 15–41)
Albumin: 3.9 g/dL (ref 3.5–5.0)
Alkaline Phosphatase: 145 U/L — ABNORMAL HIGH (ref 38–126)
Anion gap: 7 (ref 5–15)
BUN: 11 mg/dL (ref 8–23)
CO2: 24 mmol/L (ref 22–32)
Calcium: 9.6 mg/dL (ref 8.9–10.3)
Chloride: 103 mmol/L (ref 98–111)
Creatinine: 0.72 mg/dL (ref 0.44–1.00)
GFR, Estimated: >60 mL/min (ref >60)
Glucose, Bld: 189 mg/dL — ABNORMAL HIGH (ref 70–99)
Potassium: 4.3 mmol/L (ref 3.5–5.1)
Sodium: 134 mmol/L — ABNORMAL LOW (ref 135–145)
Total Bilirubin: 1.4 mg/dL — ABNORMAL HIGH (ref 0.0–1.2)
Total Protein: 6.4 g/dL — ABNORMAL LOW (ref 6.5–8.1)

## 2023-09-15 MED ORDER — DENOSUMAB 120 MG/1.7ML ~~LOC~~ SOLN
120.0000 mg | Freq: Once | SUBCUTANEOUS | Status: AC
Start: 2023-09-15 — End: 2023-09-15
  Administered 2023-09-15: 120 mg via SUBCUTANEOUS
  Filled 2023-09-15: qty 1.7

## 2023-09-15 MED ORDER — PALONOSETRON HCL INJECTION 0.25 MG/5ML
0.2500 mg | Freq: Once | INTRAVENOUS | Status: AC
  Administered 2023-09-15: 0.25 mg via INTRAVENOUS
  Filled 2023-09-15: qty 5

## 2023-09-15 MED ORDER — SODIUM CHLORIDE 0.9 % IV SOLN: Freq: Once | INTRAVENOUS | Status: AC

## 2023-09-15 MED ORDER — SODIUM CHLORIDE 0.9% FLUSH
10.0000 mL | INTRAVENOUS | Status: DC | PRN
  Administered 2023-09-15: 10 mL

## 2023-09-15 MED ORDER — SODIUM CHLORIDE 0.9% FLUSH
10.0000 mL | Freq: Once | INTRAVENOUS | Status: AC
  Administered 2023-09-15: 10 mL

## 2023-09-15 MED ORDER — DEXTROSE 5 % IV SOLN: Freq: Once | INTRAVENOUS | Status: AC

## 2023-09-15 MED ORDER — SODIUM CHLORIDE 0.9 % IV SOLN
150.0000 mg | Freq: Once | INTRAVENOUS | Status: AC
  Administered 2023-09-15: 150 mg via INTRAVENOUS
  Filled 2023-09-15: qty 150

## 2023-09-15 MED ORDER — ACETAMINOPHEN 325 MG PO TABS
650.0000 mg | ORAL_TABLET | Freq: Once | ORAL | Status: AC
Start: 2023-09-15 — End: 2023-09-15
  Administered 2023-09-15: 650 mg via ORAL
  Filled 2023-09-15: qty 2

## 2023-09-15 MED ORDER — DEXAMETHASONE SODIUM PHOSPHATE 10 MG/ML IJ SOLN
10.0000 mg | Freq: Once | INTRAMUSCULAR | Status: AC
Start: 2023-09-15 — End: 2023-09-15
  Administered 2023-09-15: 10 mg via INTRAVENOUS
  Filled 2023-09-15: qty 1

## 2023-09-15 MED ORDER — HEPARIN SOD (PORK) LOCK FLUSH 100 UNIT/ML IV SOLN
500.0000 [IU] | Freq: Once | INTRAVENOUS | Status: AC | PRN
Start: 2023-09-15 — End: 2023-09-15
  Administered 2023-09-15: 500 [IU]

## 2023-09-15 MED ORDER — CETIRIZINE HCL 10 MG PO TABS
10.0000 mg | ORAL_TABLET | Freq: Once | ORAL | Status: AC
  Administered 2023-09-15: 10 mg via ORAL
  Filled 2023-09-15: qty 1

## 2023-09-15 MED ORDER — FAM-TRASTUZUMAB DERUXTECAN-NXKI CHEMO 100 MG IV SOLR
4.4000 mg/kg | Freq: Once | INTRAVENOUS | Status: AC
  Administered 2023-09-15: 300 mg via INTRAVENOUS
  Filled 2023-09-15: qty 15

## 2023-09-15 NOTE — Assessment & Plan Note (Signed)
 07/03/2019: Right breast LIQ T1CN0 stage Ia grade 2 IDC ER 95% PR negative HER2 negative Ki-67 5% 10/04/2019: Right lumpectomy: T2N1 stage IIb grade 2 IDC positive lymphovascular invasion 3 lymph nodes removed 1 with macro N1 micrometastatic disease 10/04/2019: MammaPrint: Low risk 01/05/2020: Completed adjuvant radiation 08/04/2019: Genetics: Negative November 2020: Tamoxifen  ------------------------------------------------------------------- 09/28/2022: Patient presented with platelets 65 INR 1.9 elevated LFTs and hypercalcemia  CT CAP 09/29/2022: Mediastinal and hilar lymphadenopathy suggestive of metastatic disease.  Patchy tree-in-bud nodularity in the lungs, partially necrotic mass right hepatic lobe 12 cm, also 2.5 cm lesion lytic bone metastases in spine pelvis and left scapula   Treatment plan: Liver biopsy: Poorly diff cancer ER 30-40%, PR:0%, Her 2 Neg Verzinio with AI started 10/24/2022 discontinued May 2024 due to progression in the liver 3.  Bone Marrow Biopsy: 10/13/22: Positive for met breast cancer:   4.  Hospitalization 01/11/2023-01/21/2023: Severe jaundice due to progression in the liver 5.  Perative radiation to the spine  -------------------------------------------------------------------------------------------------------------------- Current treatment: Enhertu  cycle 12 (started 01/15/2023) Enhertu  toxicities: None   PET CT scan 06/11/2023: No evidence of recurrent/metastatic disease.  Cirrhotic/pseudocirrhosis liver, treated bone metastases Hospitalization 09/02/2023-09/07/2023: Shortness of breath and cough with hypoxia requiring oxygen

## 2023-09-15 NOTE — Progress Notes (Signed)
 Patient Care Team: Artemisa Bile, MD as PCP - General (Internal Medicine) Lockie Rima, MD as Consulting Physician (General Surgery) Colie Dawes, MD as Attending Physician (Radiation Oncology) Greta Leatherwood, MD as Consulting Physician (Obstetrics and Gynecology) Avis Boehringer, MD as Consulting Physician (Dermatology) Andrez Banker, MD as Consulting Physician (Urology) Tami Falcon, MD as Consulting Physician (Gastroenterology) Saundra Curl, MD as Attending Physician (Orthopedic Surgery) Cameron Cea, MD as Consulting Physician (Hematology and Oncology)  DIAGNOSIS:  Encounter Diagnosis  Name Primary?   Malignant neoplasm of lower-inner quadrant of right breast of female, estrogen receptor positive (HCC) Yes    SUMMARY OF ONCOLOGIC HISTORY: Oncology History  Malignant neoplasm of lower-inner quadrant of right breast of female, estrogen receptor positive (HCC)  07/03/2019 Initial Diagnosis   right lower inner quadrant biopsy, for a clinicaly multiple T1c N0, stage IA invasive ductal carcinoma, grade 2, E-cadherin positive, strongly estrogen receptor positive but progesterone  receptor negative and HER-2 not amplified, with an MIB-1 of 5%             (a) breast MRI 08/07/2019 showed 2 additional suspicious areas in the right breast and 1 in the left breast              (b) biopsy of all 3 areas 08/21/2019 showed no malignancy   07/26/2019 -  Anti-estrogen oral therapy   tamoxifen  started neoadjuvantly 07/26/2019; to be continued for 10 years.  S/p TAH/BSO on 01/16/2020 with benign pathology   08/04/2019 Genetic Testing   Negative genetic testing:  No pathogenic variants detected on the [facility] Breast Cancer STAT panel or the Common Hereditary Cancers panel. The report date is 08/04/2019.  The STAT Breast cancer panel offered by [facility] includes sequencing and rearrangement analysis for the following 9 genes:  ATM, BRCA1, BRCA2, CDH1, CHEK2, PALB2, PTEN, STK11 and  TP53.  The Common Hereditary Cancers Panel offered by [facility] includes sequencing and/or deletion duplication testing of the following 48 genes: APC, ATM, AXIN2, BARD1, BMPR1A, BRCA1, BRCA2, BRIP1, CDH1, CDK4, CDKN2A (p14ARF), CDKN2A (p16INK4a), CHEK2, CTNNA1, DICER1, EPCAM (Deletion/duplication testing only), GREM1 (promoter region deletion/duplication testing only), KIT, MEN1, MLH1, MSH2, MSH3, MSH6, MUTYH, NBN, NF1, NHTL1, PALB2, PDGFRA, PMS2, POLD1, POLE, PTEN, RAD50, RAD51C, RAD51D, RNF43, SDHB, SDHC, SDHD, SMAD4, SMARCA4. STK11, TP53, TSC1, TSC2, and VHL.  The following genes were evaluated for sequence changes only: SDHA and HOXB13 c.251G>A variant only.    10/04/2019 Cancer Staging   Staging form: Breast, AJCC 8th Edition - Pathologic stage from 10/04/2019: Stage IIB (pT2, pN1a(sn), cM0, G2, ER+, PR-, HER2-) - Signed by Percival Brace, NP on 10/18/2019   10/04/2019 Surgery    right lumpectomy and sentinel lymph node sampling 10/04/2019 showed a pT2 pN1, stage IIB invasive ductal carcinoma, grade 2, with positive lymphovascular invasion and a positive superior margin             (a) 3 sentinel lymph nodes removed, one with macro metastatic deposit, 1 with a micrometastatic deposit             (b) additional surgery for margin clearance   10/04/2019 Miscellaneous    MammaPrint on the 10/04/2019 sample shows a low risk luminal A tumor predicting a 5-year metastasis free survival of 96% without chemotherapy, and a chemotherapy benefit of less than 1.5%    11/27/2019 - 01/05/2020 Radiation Therapy    Site Technique Total Dose (Gy) Dose per Fx (Gy) Completed Fx Beam Energies  Breast, Right: Breast_Rt 3D 50/50 2 25/25  6X, 10X  Breast, Right: Breast_Rt_SCV_PAB 3D 50/50 2 25/25 6X, 10X  Breast, Right: Breast_Rt_Bst 3D 10/10 2 5/5 6X, 10X     09/29/2022 Imaging   IMPRESSION: 1. Surgical changes involving the right medial breast. No obvious recurrent breast mass or chest wall mass. 2. Mediastinal  and hilar lymphadenopathy suggesting metastatic disease. 3. Patchy tree-in-bud type nodularity in the lungs suggesting chronic inflammation or atypical infection such as MAC. No definite pulmonary metastatic nodules. 4. Large partially necrotic mass involving most of the right hepatic lobe and a 2.5 cm lesion in segment 3 of the liver consistent with metastatic disease. 5. 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   10/06/2022 Initial Biopsy   Liver biopsy: + for malignancy, metastatic poorly differentiated carcinoma, likely breast origin, biomarkers pending, insufficient tissue for molecular testing. (Resulted in EPIC on 10/13/2022)   10/07/2022 PET scan   PET scan on 10/07/2022 that showed hypermetabolic metastatic breast cancer with hypermetabolic mediastinal and hilar adenopathy, hypermetabolic hepatic metastatic disease and bone disease.  There were no findings for pulmonary metastatic disease.   10/21/2022 -  Anti-estrogen oral therapy   Verzinio and Letrozole    01/15/2023 -  Chemotherapy   Patient is on Treatment Plan : BREAST METASTATIC Fam-Trastuzumab Deruxtecan-nxki  (Enhertu ) (5.4) q21d     02/19/2023 Cancer Staging   Staging form: Breast, AJCC 8th Edition - Pathologic: Stage IV (pM1) - Signed by Percival Brace, NP on 02/19/2023   Cancer, metastatic to bone (HCC)  12/21/2022 Initial Diagnosis   Cancer, metastatic to bone (HCC)   01/15/2023 -  Chemotherapy   Patient is on Treatment Plan : BREAST METASTATIC Fam-Trastuzumab Deruxtecan-nxki  (Enhertu ) (5.4) q21d       CHIEF COMPLIANT: Cycle 12 Enhertu   HISTORY OF PRESENT ILLNESS:  History of Present Illness   The patient, with a history of respiratory issues, presents for a follow-up visit. She reports vigilance in monitoring her oxygen saturation levels, which have been consistently around 92, even after exertion. She notes that her oxygen saturation dropped to 89 while getting  ready for the appointment, but it increased to 94 after using her oxygen concentrator.  The patient also reports gastrointestinal upset, which she attributes to a recent course of amoxicillin . She has been eating yogurt and plans to start taking probiotics regularly to manage this issue.  The patient also mentions a planned trip to [place]  and expresses concern about potential exposure to respiratory illnesses during the flight. She has been avoiding public places due to the current flu season and the risk of pneumonia.  Finally, the patient mentions a recent hospitalization for pneumonia, during which a CT scan was performed. The patient's partner noted that the radiologist's report indicated no changes in the liver since the last PET scan.         ALLERGIES:  is allergic to metronidazole , other, and tape.  MEDICATIONS:  Current Outpatient Medications  Medication Sig Dispense Refill   benzonatate  (TESSALON ) 200 MG capsule Take 1 capsule (200 mg total) by mouth 3 (three) times daily as needed for cough. 20 capsule 0   dexamethasone  (DECADRON ) 4 MG tablet TAKE 1 TABLET (4 MG TOTAL) BY MOUTH DAILY. TAKE 1 TABLET DAY CHEMOTHERAPY 1 TABLET 2 DAYS AFTER CHEMO WITH FOOD 30 tablet 0   docusate sodium  (COLACE) 100 MG capsule Take 1 capsule (100 mg total) by mouth daily. (Patient taking differently: Take 100 mg by mouth daily as needed for moderate constipation.) 30 capsule 6  furosemide  (LASIX ) 20 MG tablet TAKE 1 TABLET BY MOUTH EVERY DAY WITH BREAKFAST 90 tablet 1   LORazepam  (ATIVAN ) 0.5 MG tablet Take 1 tablet (0.5 mg total) by mouth every 8 (eight) hours as needed. for anxiety 30 tablet 3   omeprazole  (PRILOSEC) 20 MG capsule TAKE 1 CAPSULE BY MOUTH 2 (TWO) TIMES DAILY BEFORE A MEAL. 180 capsule 3   ondansetron  (ZOFRAN ) 8 MG tablet Take 1 tablet (8 mg total) by mouth every 8 (eight) hours as needed for nausea or vomiting. 30 tablet 2   spironolactone  (ALDACTONE ) 25 MG tablet TAKE 1 TABLET (25 MG  TOTAL) BY MOUTH DAILY. 90 tablet 1   valsartan  (DIOVAN ) 40 MG tablet Take 40 mg by mouth at bedtime.     venlafaxine  XR (EFFEXOR -XR) 37.5 MG 24 hr capsule Take 1 capsule (37.5 mg total) by mouth daily. 90 capsule 4   No current facility-administered medications for this visit.    PHYSICAL EXAMINATION: ECOG PERFORMANCE STATUS: 1 - Symptomatic but completely ambulatory  Vitals:   09/15/23 1355  BP: 125/62  Pulse: (!) 109  Resp: 18  Temp: 97.8 F (36.6 C)  SpO2: 96%   Filed Weights   09/15/23 1355  Weight: 152 lb 6.4 oz (69.1 kg)    Physical Exam   VITALS: SaO2- 92      (exam performed in the presence of a chaperone)  LABORATORY DATA:  I have reviewed the data as listed    Latest Ref Rng & Units 09/15/2023    1:24 PM 09/06/2023    3:05 AM 09/03/2023    5:57 AM  CMP  Glucose 70 - 99 mg/dL 284  132  94   BUN 8 - 23 mg/dL 11  10  8    Creatinine 0.44 - 1.00 mg/dL 4.40  1.02  7.25   Sodium 135 - 145 mmol/L 134  135  135   Potassium 3.5 - 5.1 mmol/L 4.3  4.0  3.9   Chloride 98 - 111 mmol/L 103  105  105   CO2 22 - 32 mmol/L 24  24  23    Calcium 8.9 - 10.3 mg/dL 9.6  8.8  8.3   Total Protein 6.5 - 8.1 g/dL 6.4     Total Bilirubin 0.0 - 1.2 mg/dL 1.4     Alkaline Phos 38 - 126 U/L 145     AST 15 - 41 U/L 48     ALT 0 - 44 U/L 18       Lab Results  Component Value Date   WBC 8.0 09/15/2023   HGB 13.0 09/15/2023   HCT 37.9 09/15/2023   MCV 105.0 (H) 09/15/2023   PLT 101 (L) 09/15/2023   NEUTROABS 6.8 09/15/2023    ASSESSMENT & PLAN:  Malignant neoplasm of lower-inner quadrant of right breast of female, estrogen receptor positive (HCC) 07/03/2019: Right breast LIQ T1CN0 stage Ia grade 2 IDC ER 95% PR negative HER2 negative Ki-67 5% 10/04/2019: Right lumpectomy: T2N1 stage IIb grade 2 IDC positive lymphovascular invasion 3 lymph nodes removed 1 with macro N1 micrometastatic disease 10/04/2019: MammaPrint: Low risk 01/05/2020: Completed adjuvant radiation 08/04/2019: Genetics:  Negative November 2020: Tamoxifen  ------------------------------------------------------------------- 09/28/2022: Patient presented with platelets 65 INR 1.9 elevated LFTs and hypercalcemia  CT CAP 09/29/2022: Mediastinal and hilar lymphadenopathy suggestive of metastatic disease.  Patchy tree-in-bud nodularity in the lungs, partially necrotic mass right hepatic lobe 12 cm, also 2.5 cm lesion lytic bone metastases in spine pelvis and left scapula   Treatment plan:  Liver biopsy: Poorly diff cancer ER 30-40%, PR:0%, Her 2 Neg Verzinio with AI started 10/24/2022 discontinued May 2024 due to progression in the liver 3.  Bone Marrow Biopsy: 10/13/22: Positive for met breast cancer:   4.  Hospitalization 01/11/2023-01/21/2023: Severe jaundice due to progression in the liver 5.  Perative radiation to the spine  -------------------------------------------------------------------------------------------------------------------- Current treatment: Enhertu  cycle 12 (started 01/15/2023) Enhertu  toxicities: None   PET CT scan 06/11/2023: No evidence of recurrent/metastatic disease.  Cirrhotic/pseudocirrhosis liver, treated bone metastases Hospitalization 09/02/2023-09/07/2023: Shortness of breath and cough with hypoxia requiring oxygen   ------------------------------------- Assessment and Plan    Oxygen Saturation Improved oxygen saturation levels, maintaining above 88% without supplemental oxygen. Recent pneumonia and subsequent hospitalization. -Continue monitoring oxygen saturation at home.  Gastrointestinal Upset Recent course of Amoxicillin  likely contributing to gastrointestinal upset. -Start probiotics and continue eating yogurt.  Anemia Hemoglobin improved to 13, best value seen to date. -Continue current management.  Liver Lesions Stable on recent CT scan. -Postpone PET scan by two months, monitor tumor marker blood test.  Travel Plans Upcoming travel to [place] . -Reschedule treatment to  11/08/2023 to accommodate travel plans. -Advise wearing a mask during flight for infection prevention.          No orders of the defined types were placed in this encounter.  The patient has a good understanding of the overall plan. she agrees with it. she will call with any problems that may develop before the next visit here. Total time spent: 30 mins including face to face time and time spent for planning, charting and co-ordination of care   Viinay K Jeannette Maddy, MD 09/15/23

## 2023-09-16 LAB — CANCER ANTIGEN 27.29: CA 27.29: 446.4 U/mL — ABNORMAL HIGH (ref 0.0–38.6)

## 2023-09-17 ENCOUNTER — Telehealth: Payer: Self-pay | Admitting: *Deleted

## 2023-09-17 NOTE — Telephone Encounter (Signed)
Received call from pt stating upcomming appts are incorrect.  Pt was hospitalized which pushed tx out to 09/15/23.  Pt tx plan is updated and RN sent high priority in-basket to scheduling team to adjust all future appt.

## 2023-09-19 ENCOUNTER — Other Ambulatory Visit: Payer: Self-pay

## 2023-09-20 ENCOUNTER — Encounter (HOSPITAL_COMMUNITY): Payer: Medicare HMO

## 2023-09-27 ENCOUNTER — Ambulatory Visit: Payer: Medicare HMO | Admitting: Hematology and Oncology

## 2023-09-27 ENCOUNTER — Other Ambulatory Visit: Payer: Medicare HMO

## 2023-09-27 ENCOUNTER — Ambulatory Visit: Payer: Medicare HMO

## 2023-10-08 MED FILL — Fosaprepitant Dimeglumine For IV Infusion 150 MG (Base Eq): INTRAVENOUS | Qty: 5 | Status: AC

## 2023-10-11 ENCOUNTER — Encounter: Payer: Self-pay | Admitting: Hematology and Oncology

## 2023-10-11 ENCOUNTER — Inpatient Hospital Stay: Payer: Medicare HMO

## 2023-10-11 ENCOUNTER — Inpatient Hospital Stay: Payer: Medicare HMO | Attending: Hematology and Oncology

## 2023-10-11 ENCOUNTER — Inpatient Hospital Stay: Payer: Medicare HMO | Attending: Hematology and Oncology | Admitting: Hematology and Oncology

## 2023-10-11 VITALS — BP 122/72 | HR 92 | Temp 98.0°F | Resp 17 | Ht 65.0 in | Wt 154.7 lb

## 2023-10-11 DIAGNOSIS — C787 Secondary malignant neoplasm of liver and intrahepatic bile duct: Secondary | ICD-10-CM | POA: Insufficient documentation

## 2023-10-11 DIAGNOSIS — D696 Thrombocytopenia, unspecified: Secondary | ICD-10-CM | POA: Diagnosis not present

## 2023-10-11 DIAGNOSIS — Z170 Estrogen receptor positive status [ER+]: Secondary | ICD-10-CM

## 2023-10-11 DIAGNOSIS — C50311 Malignant neoplasm of lower-inner quadrant of right female breast: Secondary | ICD-10-CM | POA: Insufficient documentation

## 2023-10-11 DIAGNOSIS — Z5112 Encounter for antineoplastic immunotherapy: Secondary | ICD-10-CM | POA: Diagnosis present

## 2023-10-11 DIAGNOSIS — Z923 Personal history of irradiation: Secondary | ICD-10-CM | POA: Insufficient documentation

## 2023-10-11 DIAGNOSIS — C7951 Secondary malignant neoplasm of bone: Secondary | ICD-10-CM

## 2023-10-11 DIAGNOSIS — C419 Malignant neoplasm of bone and articular cartilage, unspecified: Secondary | ICD-10-CM

## 2023-10-11 DIAGNOSIS — Z1722 Progesterone receptor negative status: Secondary | ICD-10-CM | POA: Diagnosis not present

## 2023-10-11 DIAGNOSIS — Z1732 Human epidermal growth factor receptor 2 negative status: Secondary | ICD-10-CM | POA: Diagnosis not present

## 2023-10-11 DIAGNOSIS — Z79899 Other long term (current) drug therapy: Secondary | ICD-10-CM | POA: Insufficient documentation

## 2023-10-11 LAB — CBC WITH DIFFERENTIAL/PLATELET
Abs Immature Granulocytes: 0.02 10*3/uL (ref 0.00–0.07)
Basophils Absolute: 0 10*3/uL (ref 0.0–0.1)
Basophils Relative: 1 %
Eosinophils Absolute: 0 10*3/uL (ref 0.0–0.5)
Eosinophils Relative: 1 %
HCT: 37.8 % (ref 36.0–46.0)
Hemoglobin: 12.6 g/dL (ref 12.0–15.0)
Immature Granulocytes: 0 %
Lymphocytes Relative: 13 %
Lymphs Abs: 0.8 10*3/uL (ref 0.7–4.0)
MCH: 34.7 pg — ABNORMAL HIGH (ref 26.0–34.0)
MCHC: 33.3 g/dL (ref 30.0–36.0)
MCV: 104.1 fL — ABNORMAL HIGH (ref 80.0–100.0)
Monocytes Absolute: 0.4 10*3/uL (ref 0.1–1.0)
Monocytes Relative: 6 %
Neutro Abs: 4.6 10*3/uL (ref 1.7–7.7)
Neutrophils Relative %: 79 %
Platelets: 49 10*3/uL — ABNORMAL LOW (ref 150–400)
RBC: 3.63 MIL/uL — ABNORMAL LOW (ref 3.87–5.11)
RDW: 15.7 % — ABNORMAL HIGH (ref 11.5–15.5)
Smear Review: DECREASED
WBC: 5.8 10*3/uL (ref 4.0–10.5)
nRBC: 0 % (ref 0.0–0.2)

## 2023-10-11 LAB — CBC WITH DIFFERENTIAL (CANCER CENTER ONLY)
Abs Immature Granulocytes: 0.02 10*3/uL (ref 0.00–0.07)
Basophils Absolute: 0 10*3/uL (ref 0.0–0.1)
Basophils Relative: 1 %
Eosinophils Absolute: 0 10*3/uL (ref 0.0–0.5)
Eosinophils Relative: 1 %
HCT: 37.4 % (ref 36.0–46.0)
Hemoglobin: 12.4 g/dL (ref 12.0–15.0)
Immature Granulocytes: 0 %
Lymphocytes Relative: 13 %
Lymphs Abs: 0.8 10*3/uL (ref 0.7–4.0)
MCH: 34.8 pg — ABNORMAL HIGH (ref 26.0–34.0)
MCHC: 33.2 g/dL (ref 30.0–36.0)
MCV: 105.1 fL — ABNORMAL HIGH (ref 80.0–100.0)
Monocytes Absolute: 0.4 10*3/uL (ref 0.1–1.0)
Monocytes Relative: 8 %
Neutro Abs: 4.5 10*3/uL (ref 1.7–7.7)
Neutrophils Relative %: 77 %
Platelet Count: 48 10*3/uL — ABNORMAL LOW (ref 150–400)
RBC: 3.56 MIL/uL — ABNORMAL LOW (ref 3.87–5.11)
RDW: 15.7 % — ABNORMAL HIGH (ref 11.5–15.5)
WBC Count: 5.8 10*3/uL (ref 4.0–10.5)
nRBC: 0 % (ref 0.0–0.2)

## 2023-10-11 LAB — CMP (CANCER CENTER ONLY)
ALT: 22 U/L (ref 0–44)
AST: 62 U/L — ABNORMAL HIGH (ref 15–41)
Albumin: 3.9 g/dL (ref 3.5–5.0)
Alkaline Phosphatase: 152 U/L — ABNORMAL HIGH (ref 38–126)
Anion gap: 5 (ref 5–15)
BUN: 8 mg/dL (ref 8–23)
CO2: 25 mmol/L (ref 22–32)
Calcium: 9.1 mg/dL (ref 8.9–10.3)
Chloride: 107 mmol/L (ref 98–111)
Creatinine: 0.61 mg/dL (ref 0.44–1.00)
GFR, Estimated: >60 mL/min (ref >60)
Glucose, Bld: 107 mg/dL — ABNORMAL HIGH (ref 70–99)
Potassium: 4.2 mmol/L (ref 3.5–5.1)
Sodium: 137 mmol/L (ref 135–145)
Total Bilirubin: 1.5 mg/dL — ABNORMAL HIGH (ref 0.0–1.2)
Total Protein: 6.1 g/dL — ABNORMAL LOW (ref 6.5–8.1)

## 2023-10-11 MED ORDER — DEXTROSE 5 % IV SOLN: Freq: Once | INTRAVENOUS | Status: AC

## 2023-10-11 MED ORDER — CETIRIZINE HCL 10 MG PO TABS
10.0000 mg | ORAL_TABLET | Freq: Once | ORAL | Status: AC
  Administered 2023-10-11: 10 mg via ORAL
  Filled 2023-10-11: qty 1

## 2023-10-11 MED ORDER — FAM-TRASTUZUMAB DERUXTECAN-NXKI CHEMO 100 MG IV SOLR
4.4000 mg/kg | Freq: Once | INTRAVENOUS | Status: AC
  Administered 2023-10-11: 300 mg via INTRAVENOUS
  Filled 2023-10-11: qty 15

## 2023-10-11 MED ORDER — DEXAMETHASONE SODIUM PHOSPHATE 10 MG/ML IJ SOLN
10.0000 mg | Freq: Once | INTRAMUSCULAR | Status: AC
Start: 2023-10-11 — End: 2023-10-11
  Administered 2023-10-11: 10 mg via INTRAVENOUS
  Filled 2023-10-11: qty 1

## 2023-10-11 MED ORDER — ACETAMINOPHEN 325 MG PO TABS
650.0000 mg | ORAL_TABLET | Freq: Once | ORAL | Status: AC
  Administered 2023-10-11: 650 mg via ORAL
  Filled 2023-10-11: qty 2

## 2023-10-11 MED ORDER — SODIUM CHLORIDE 0.9% FLUSH
10.0000 mL | Freq: Once | INTRAVENOUS | Status: AC
  Administered 2023-10-11: 10 mL

## 2023-10-11 MED ORDER — PALONOSETRON HCL INJECTION 0.25 MG/5ML
0.2500 mg | Freq: Once | INTRAVENOUS | Status: AC
  Administered 2023-10-11: 0.25 mg via INTRAVENOUS
  Filled 2023-10-11: qty 5

## 2023-10-11 MED ORDER — FOSAPREPITANT DIMEGLUMINE INJECTION 150 MG
150.0000 mg | Freq: Once | INTRAVENOUS | Status: AC
  Administered 2023-10-11: 150 mg via INTRAVENOUS
  Filled 2023-10-11: qty 150

## 2023-10-11 MED ORDER — HEPARIN SOD (PORK) LOCK FLUSH 100 UNIT/ML IV SOLN
500.0000 [IU] | Freq: Once | INTRAVENOUS | Status: AC | PRN
  Administered 2023-10-11: 500 [IU]

## 2023-10-11 MED ORDER — SODIUM CHLORIDE 0.9% FLUSH
10.0000 mL | INTRAVENOUS | Status: DC | PRN
  Administered 2023-10-11: 10 mL

## 2023-10-11 NOTE — Progress Notes (Signed)
 Patient Care Team: Artemisa Bile, MD as PCP - General (Internal Medicine) Lockie Rima, MD as Consulting Physician (General Surgery) Colie Dawes, MD as Attending Physician (Radiation Oncology) Greta Leatherwood, MD as Consulting Physician (Obstetrics and Gynecology) Avis Boehringer, MD as Consulting Physician (Dermatology) Andrez Banker, MD as Consulting Physician (Urology) Tami Falcon, MD as Consulting Physician (Gastroenterology) Saundra Curl, MD as Attending Physician (Orthopedic Surgery) Cameron Cea, MD as Consulting Physician (Hematology and Oncology)  DIAGNOSIS:  Encounter Diagnosis  Name Primary?   Malignant neoplasm of lower-inner quadrant of right breast of female, estrogen receptor positive (HCC) Yes    SUMMARY OF ONCOLOGIC HISTORY: Oncology History  Malignant neoplasm of lower-inner quadrant of right breast of female, estrogen receptor positive (HCC)  07/03/2019 Initial Diagnosis   right lower inner quadrant biopsy, for a clinicaly multiple T1c N0, stage IA invasive ductal carcinoma, grade 2, E-cadherin positive, strongly estrogen receptor positive but progesterone  receptor negative and HER-2 not amplified, with an MIB-1 of 5%             (a) breast MRI 08/07/2019 showed 2 additional suspicious areas in the right breast and 1 in the left breast              (b) biopsy of all 3 areas 08/21/2019 showed no malignancy   07/26/2019 -  Anti-estrogen oral therapy   tamoxifen  started neoadjuvantly 07/26/2019; to be continued for 10 years.  S/p TAH/BSO on 01/16/2020 with benign pathology   08/04/2019 Genetic Testing   Negative genetic testing:  No pathogenic variants detected on the [facility] Breast Cancer STAT panel or the Common Hereditary Cancers panel. The report date is 08/04/2019.  The STAT Breast cancer panel offered by [facility] includes sequencing and rearrangement analysis for the following 9 genes:  ATM, BRCA1, BRCA2, CDH1, CHEK2, PALB2, PTEN, STK11 and  TP53.  The Common Hereditary Cancers Panel offered by [facility] includes sequencing and/or deletion duplication testing of the following 48 genes: APC, ATM, AXIN2, BARD1, BMPR1A, BRCA1, BRCA2, BRIP1, CDH1, CDK4, CDKN2A (p14ARF), CDKN2A (p16INK4a), CHEK2, CTNNA1, DICER1, EPCAM (Deletion/duplication testing only), GREM1 (promoter region deletion/duplication testing only), KIT, MEN1, MLH1, MSH2, MSH3, MSH6, MUTYH, NBN, NF1, NHTL1, PALB2, PDGFRA, PMS2, POLD1, POLE, PTEN, RAD50, RAD51C, RAD51D, RNF43, SDHB, SDHC, SDHD, SMAD4, SMARCA4. STK11, TP53, TSC1, TSC2, and VHL.  The following genes were evaluated for sequence changes only: SDHA and HOXB13 c.251G>A variant only.    10/04/2019 Cancer Staging   Staging form: Breast, AJCC 8th Edition - Pathologic stage from 10/04/2019: Stage IIB (pT2, pN1a(sn), cM0, G2, ER+, PR-, HER2-) - Signed by Percival Brace, NP on 10/18/2019   10/04/2019 Surgery    right lumpectomy and sentinel lymph node sampling 10/04/2019 showed a pT2 pN1, stage IIB invasive ductal carcinoma, grade 2, with positive lymphovascular invasion and a positive superior margin             (a) 3 sentinel lymph nodes removed, one with macro metastatic deposit, 1 with a micrometastatic deposit             (b) additional surgery for margin clearance   10/04/2019 Miscellaneous    MammaPrint on the 10/04/2019 sample shows a low risk luminal A tumor predicting a 5-year metastasis free survival of 96% without chemotherapy, and a chemotherapy benefit of less than 1.5%    11/27/2019 - 01/05/2020 Radiation Therapy    Site Technique Total Dose (Gy) Dose per Fx (Gy) Completed Fx Beam Energies  Breast, Right: Breast_Rt 3D 50/50 2 25/25  6X, 10X  Breast, Right: Breast_Rt_SCV_PAB 3D 50/50 2 25/25 6X, 10X  Breast, Right: Breast_Rt_Bst 3D 10/10 2 5/5 6X, 10X     09/29/2022 Imaging   IMPRESSION: 1. Surgical changes involving the right medial breast. No obvious recurrent breast mass or chest wall mass. 2. Mediastinal  and hilar lymphadenopathy suggesting metastatic disease. 3. Patchy tree-in-bud type nodularity in the lungs suggesting chronic inflammation or atypical infection such as MAC. No definite pulmonary metastatic nodules. 4. Large partially necrotic mass involving most of the right hepatic lobe and a 2.5 cm lesion in segment 3 of the liver consistent with metastatic disease. 5. 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   10/06/2022 Initial Biopsy   Liver biopsy: + for malignancy, metastatic poorly differentiated carcinoma, likely breast origin, biomarkers pending, insufficient tissue for molecular testing. (Resulted in EPIC on 10/13/2022)   10/07/2022 PET scan   PET scan on 10/07/2022 that showed hypermetabolic metastatic breast cancer with hypermetabolic mediastinal and hilar adenopathy, hypermetabolic hepatic metastatic disease and bone disease.  There were no findings for pulmonary metastatic disease.   10/21/2022 -  Anti-estrogen oral therapy   Verzinio and Letrozole    01/15/2023 -  Chemotherapy   Patient is on Treatment Plan : BREAST METASTATIC Fam-Trastuzumab Deruxtecan-nxki  (Enhertu ) (5.4) q21d     02/19/2023 Cancer Staging   Staging form: Breast, AJCC 8th Edition - Pathologic: Stage IV (pM1) - Signed by Percival Brace, NP on 02/19/2023   Cancer, metastatic to bone (HCC)  12/21/2022 Initial Diagnosis   Cancer, metastatic to bone (HCC)   01/15/2023 -  Chemotherapy   Patient is on Treatment Plan : BREAST METASTATIC Fam-Trastuzumab Deruxtecan-nxki  (Enhertu ) (5.4) q21d       CHIEF COMPLIANT: Cycle 13 Enhertu   HISTORY OF PRESENT ILLNESS:   History of Present Illness   Theresa [name] is a 68 year old female undergoing chemotherapy with Enhertu  who presents for follow-up of her treatment and blood counts. She is accompanied by Darryl, her partner.  She is currently on her 13th chemotherapy treatment and notes improvement in her overall  condition, feeling more like herself and experiencing less fatigue. She recently traveled to [place]  and managed well during the trip, indicating a positive response to her current treatment regimen.  Her recent blood work showed a significant drop in platelet count from 101 to 48. She has not experienced any unusual bruising or bleeding, although she notes a bruise on her knee and occasional epistaxis, which she attributes to the winter season. No gum bleeding is reported.  No numbness or tingling in her fingers or toes. Her taste, appetite, and bowel movements have improved since starting a probiotic last summer. She occasionally experiences gas, which is relieved by simethicone , and denies any nausea.  Recently, she has experienced back pain, which she attributes to sleeping in a strange bed during her travels. The pain is not located where she had previous surgery. Stretching her calves and quadriceps has helped alleviate the discomfort, and she takes acetaminophen  as needed. Her partner, Darryl, assists by applying topical treatments nightly.  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 She expresses some frustration with the lack of engagement from the trainees.         ALLERGIES:  is allergic to metronidazole , other, and tape.  MEDICATIONS:  Current Outpatient Medications  Medication Sig Dispense Refill   benzonatate  (TESSALON ) 200 MG capsule Take 1 capsule (200 mg total)  by mouth 3 (three) times daily as needed for cough. 20 capsule 0   dexamethasone  (DECADRON ) 4 MG tablet TAKE 1 TABLET (4 MG TOTAL) BY MOUTH DAILY. TAKE 1 TABLET DAY CHEMOTHERAPY 1 TABLET 2 DAYS AFTER CHEMO WITH FOOD 30 tablet 0   docusate sodium  (COLACE) 100 MG capsule Take 1 capsule (100 mg total) by mouth daily. (Patient taking differently: Take 100 mg by mouth daily as needed for moderate constipation.) 30 capsule 6   furosemide  (LASIX ) 20 MG tablet TAKE 1  TABLET BY MOUTH EVERY DAY WITH BREAKFAST 90 tablet 1   LORazepam  (ATIVAN ) 0.5 MG tablet Take 1 tablet (0.5 mg total) by mouth every 8 (eight) hours as needed. for anxiety 30 tablet 3   omeprazole  (PRILOSEC) 20 MG capsule TAKE 1 CAPSULE BY MOUTH 2 (TWO) TIMES DAILY BEFORE A MEAL. 180 capsule 3   ondansetron  (ZOFRAN ) 8 MG tablet Take 1 tablet (8 mg total) by mouth every 8 (eight) hours as needed for nausea or vomiting. 30 tablet 2   spironolactone  (ALDACTONE ) 25 MG tablet TAKE 1 TABLET (25 MG TOTAL) BY MOUTH DAILY. 90 tablet 1   valsartan  (DIOVAN ) 40 MG tablet Take 40 mg by mouth at bedtime.     venlafaxine  XR (EFFEXOR -XR) 37.5 MG 24 hr capsule Take 1 capsule (37.5 mg total) by mouth daily. 90 capsule 4   No current facility-administered medications for this visit.   Facility-Administered Medications Ordered in Other Visits  Medication Dose Route Frequency Provider Last Rate Last Admin   dexamethasone  (DECADRON ) injection 10 mg  10 mg Intravenous Once Kristalynn Coddington, MD       fam-trastuzumab deruxtecan-nxki  (ENHERTU ) 300 mg in dextrose  5 % 100 mL chemo infusion  4.4 mg/kg (Treatment Plan Recorded) Intravenous Once Cameron Cea, MD       fosaprepitant  (EMEND) 150 mg in sodium chloride  0.9 % 145 mL IVPB  150 mg Intravenous Once Cameron Cea, MD       heparin  lock flush 100 unit/mL  500 Units Intracatheter Once PRN Malli Falotico, MD       palonosetron  (ALOXI ) injection 0.25 mg  0.25 mg Intravenous Once Cameron Cea, MD       sodium chloride  flush (NS) 0.9 % injection 10 mL  10 mL Intracatheter PRN Cameron Cea, MD        PHYSICAL EXAMINATION: ECOG PERFORMANCE STATUS: 1 - Symptomatic but completely ambulatory  Vitals:   10/11/23 1353  BP: 122/72  Pulse: 92  Resp: 17  Temp: 98 F (36.7 C)  SpO2: 98%   Filed Weights   10/11/23 1353  Weight: 154 lb 11.2 oz (70.2 kg)     LABORATORY DATA:  I have reviewed the data as listed    Latest Ref Rng & Units 10/11/2023    1:24 PM 09/15/2023     1:24 PM 09/06/2023    3:05 AM  CMP  Glucose 70 - 99 mg/dL 782  956  213   BUN 8 - 23 mg/dL 8  11  10    Creatinine 0.44 - 1.00 mg/dL 0.86  5.78  4.69   Sodium 135 - 145 mmol/L 137  134  135   Potassium 3.5 - 5.1 mmol/L 4.2  4.3  4.0   Chloride 98 - 111 mmol/L 107  103  105   CO2 22 - 32 mmol/L 25  24  24    Calcium 8.9 - 10.3 mg/dL 9.1  9.6  8.8   Total Protein 6.5 - 8.1 g/dL 6.1  6.4  Total Bilirubin 0.0 - 1.2 mg/dL 1.5  1.4    Alkaline Phos 38 - 126 U/L 152  145    AST 15 - 41 U/L 62  48    ALT 0 - 44 U/L 22  18      Lab Results  Component Value Date   WBC 5.8 10/11/2023   HGB 12.4 10/11/2023   HCT 37.4 10/11/2023   MCV 105.1 (H) 10/11/2023   PLT 48 (L) 10/11/2023   NEUTROABS 4.5 10/11/2023    ASSESSMENT & PLAN:  Malignant neoplasm of lower-inner quadrant of right breast of female, estrogen receptor positive (HCC) 07/03/2019: Right breast LIQ T1CN0 stage Ia grade 2 IDC ER 95% PR negative HER2 negative Ki-67 5% 10/04/2019: Right lumpectomy: T2N1 stage IIb grade 2 IDC positive lymphovascular invasion 3 lymph nodes removed 1 with macro N1 micrometastatic disease 10/04/2019: MammaPrint: Low risk 01/05/2020: Completed adjuvant radiation 08/04/2019: Genetics: Negative November 2020: Tamoxifen  ------------------------------------------------------------------- 09/28/2022: Patient presented with platelets 65 INR 1.9 elevated LFTs and hypercalcemia  CT CAP 09/29/2022: Mediastinal and hilar lymphadenopathy suggestive of metastatic disease.  Patchy tree-in-bud nodularity in the lungs, partially necrotic mass right hepatic lobe 12 cm, also 2.5 cm lesion lytic bone metastases in spine pelvis and left scapula   Treatment plan: Liver biopsy: Poorly diff cancer ER 30-40%, PR:0%, Her 2 Neg Verzinio with AI started 10/24/2022 discontinued May 2024 due to progression in the liver 3.  Bone Marrow Biopsy: 10/13/22: Positive for met breast cancer:   4.  Hospitalization 01/11/2023-01/21/2023: Severe jaundice  due to progression in the liver 5.  Perative radiation to the spine  -------------------------------------------------------------------------------------------------------------------- Current treatment: Enhertu  cycle 13 (started 01/15/2023) Enhertu  toxicities: None   PET CT scan 06/11/2023: No evidence of recurrent/metastatic disease.  Cirrhotic/pseudocirrhosis liver, treated bone metastases Hospitalization 09/02/2023-09/07/2023: Shortness of breath and cough with hypoxia requiring oxygen PET CT scan scheduled for 11/18/2023  Return to clinic every 3 weeks for Enhertu  and follow-ups ------------------------------------- Assessment and Plan    Cancer Treatment Follow-up Undergoing 13th treatment for cancer. Reports no numbness, tingling, or significant side effects. Taste, appetite, and bowel movements improved with probiotics. Mild gas managed with simethicone . No nausea reported. Recent travel to [place]  and [place] noted. Feels more like herself and less fatigued. Cancer indigent test initiated to assess between treatments; will discontinue if not helpful. - Continue current cancer treatment regimen - Monitor for side effects and report any new symptoms - Continue cancer indigent test for a few cycles to assess its utility  Thrombocytopenia Platelet count dropped from 101 to 48. Differential diagnosis includes chemotherapy-induced thrombocytopenia or cancer activity in the bone marrow. No unusual bruising or bleeding noted. Potential for epistaxis, gum bleeds, and bruising discussed. Lab error considered as a possible cause. Risks of further platelet drop include bleeding complications. Treatment to proceed irrespective of platelet count. - Redraw CBC to verify platelet count - Proceed with current treatment irrespective of platelet count - Monitor for signs of bleeding and bruising - Avoid activities that could cause cuts or bruising  Back Pain Reports back pain likely due to sleeping in a  strange bed, not related to previous surgery. Pain improving with stretching and acetaminophen . - Incorporate daily stretching routine, especially for calves and quads - Continue using acetaminophen  as needed for pain management  General Health Maintenance Hemoglobin at 12.4. White blood cell count within normal range. - Continue monitoring CBC regularly  Follow-up - Attend scan appointment on March 20th - Follow up with the doctor for the next  treatment session.          Orders Placed This Encounter  Procedures   CBC with Differential/Platelet   The patient has a good understanding of the overall plan. she agrees with it. she will call with any problems that may develop before the next visit here. Total time spent: 30 mins including face to face time and time spent for planning, charting and co-ordination of care   Viinay K Lilias Lorensen, MD 10/11/23

## 2023-10-11 NOTE — Assessment & Plan Note (Signed)
 07/03/2019: Right breast LIQ T1CN0 stage Ia grade 2 IDC ER 95% PR negative HER2 negative Ki-67 5% 10/04/2019: Right lumpectomy: T2N1 stage IIb grade 2 IDC positive lymphovascular invasion 3 lymph nodes removed 1 with macro N1 micrometastatic disease 10/04/2019: MammaPrint: Low risk 01/05/2020: Completed adjuvant radiation 08/04/2019: Genetics: Negative November 2020: Tamoxifen  ------------------------------------------------------------------- 09/28/2022: Patient presented with platelets 65 INR 1.9 elevated LFTs and hypercalcemia  CT CAP 09/29/2022: Mediastinal and hilar lymphadenopathy suggestive of metastatic disease.  Patchy tree-in-bud nodularity in the lungs, partially necrotic mass right hepatic lobe 12 cm, also 2.5 cm lesion lytic bone metastases in spine pelvis and left scapula   Treatment plan: Liver biopsy: Poorly diff cancer ER 30-40%, PR:0%, Her 2 Neg Verzinio with AI started 10/24/2022 discontinued May 2024 due to progression in the liver 3.  Bone Marrow Biopsy: 10/13/22: Positive for met breast cancer:   4.  Hospitalization 01/11/2023-01/21/2023: Severe jaundice due to progression in the liver 5.  Perative radiation to the spine  -------------------------------------------------------------------------------------------------------------------- Current treatment: Enhertu  cycle 13 (started 01/15/2023) Enhertu  toxicities: None   PET CT scan 06/11/2023: No evidence of recurrent/metastatic disease.  Cirrhotic/pseudocirrhosis liver, treated bone metastases Hospitalization 09/02/2023-09/07/2023: Shortness of breath and cough with hypoxia requiring oxygen PET CT scan scheduled for 11/18/2023  Return to clinic every 3 weeks for Enhertu  and follow-ups

## 2023-10-11 NOTE — Patient Instructions (Signed)
 [facility] - A DEPT OF [facility]  Discharge Instructions: Thank you for choosing [facility] to provide your oncology and hematology care.   If you have a lab appointment with the Cancer Center, please go directly to the Cancer Center and check in at the registration area.   Wear comfortable clothing and clothing appropriate for easy access to any Portacath or PICC line.   We strive to give you quality time with your provider. You may need to reschedule your appointment if you arrive late (15 or more minutes).  Arriving late affects you and other patients whose appointments are after yours.  Also, if you miss three or more appointments without notifying the office, you may be dismissed from the clinic at the provider's discretion.      For prescription refill requests, have your pharmacy contact our office and allow 72 hours for refills to be completed.    Today you received the following chemotherapy and/or immunotherapy agents: fam-trastuzumab deruxtecan-nxki      To help prevent nausea and vomiting after your treatment, we encourage you to take your nausea medication as directed.  BELOW ARE SYMPTOMS THAT SHOULD BE REPORTED IMMEDIATELY: *FEVER GREATER THAN 100.4 F (38 C) OR HIGHER *CHILLS OR SWEATING *NAUSEA AND VOMITING THAT IS NOT CONTROLLED WITH YOUR NAUSEA MEDICATION *UNUSUAL SHORTNESS OF BREATH *UNUSUAL BRUISING OR BLEEDING *URINARY PROBLEMS (pain or burning when urinating, or frequent urination) *BOWEL PROBLEMS (unusual diarrhea, constipation, pain near the anus) TENDERNESS IN MOUTH AND THROAT WITH OR WITHOUT PRESENCE OF ULCERS (sore throat, sores in mouth, or a toothache) UNUSUAL RASH, SWELLING OR PAIN  UNUSUAL VAGINAL DISCHARGE OR ITCHING   Items with * indicate a potential emergency and should be followed up as soon as possible or go to the Emergency Department if any problems should occur.  Please show the CHEMOTHERAPY ALERT  CARD or IMMUNOTHERAPY ALERT CARD at check-in to the Emergency Department and triage nurse.  Should you have questions after your visit or need to cancel or reschedule your appointment, please contact [facility] - A DEPT OF [facility]  Dept: [phone]  and follow the prompts.  Office hours are 8:00 a.m. to 4:30 p.m. Monday - Friday. Please note that voicemails left after 4:00 p.m. may not be returned until the following business day.  We are closed weekends and major holidays. You have access to a nurse at all times for urgent questions. Please call the main number to the clinic Dept: [phone] and follow the prompts.   For any non-urgent questions, you may also contact your provider using MyChart. We now offer e-Visits for anyone 32 and older to request care online for non-urgent symptoms. For details visit [email or web address].   Also download the MyChart app! Go to the app store, search "MyChart", open the app, select [facility], and log in with your MyChart username and password.

## 2023-10-12 LAB — CANCER ANTIGEN 27.29: CA 27.29: 400.5 U/mL — ABNORMAL HIGH (ref 0.0–38.6)

## 2023-10-18 ENCOUNTER — Other Ambulatory Visit: Payer: Medicare HMO

## 2023-10-18 ENCOUNTER — Ambulatory Visit: Payer: Medicare HMO

## 2023-10-18 ENCOUNTER — Ambulatory Visit: Payer: Medicare HMO | Admitting: Nurse Practitioner

## 2023-10-29 MED FILL — Fosaprepitant Dimeglumine For IV Infusion 150 MG (Base Eq): INTRAVENOUS | Qty: 5 | Status: AC

## 2023-11-01 ENCOUNTER — Inpatient Hospital Stay (HOSPITAL_BASED_OUTPATIENT_CLINIC_OR_DEPARTMENT_OTHER): Payer: Medicare HMO | Admitting: Hematology and Oncology

## 2023-11-01 ENCOUNTER — Encounter: Payer: Self-pay | Admitting: Hematology and Oncology

## 2023-11-01 ENCOUNTER — Other Ambulatory Visit: Payer: Medicare HMO

## 2023-11-01 ENCOUNTER — Ambulatory Visit: Payer: Medicare HMO

## 2023-11-01 ENCOUNTER — Inpatient Hospital Stay: Payer: Medicare HMO

## 2023-11-01 ENCOUNTER — Ambulatory Visit: Payer: Medicare HMO | Admitting: Nurse Practitioner

## 2023-11-01 ENCOUNTER — Inpatient Hospital Stay: Payer: Medicare HMO | Attending: Hematology and Oncology

## 2023-11-01 VITALS — BP 141/75 | HR 107

## 2023-11-01 VITALS — HR 107 | Temp 97.9°F | Resp 18 | Ht 65.0 in | Wt 153.6 lb

## 2023-11-01 DIAGNOSIS — Z923 Personal history of irradiation: Secondary | ICD-10-CM | POA: Diagnosis not present

## 2023-11-01 DIAGNOSIS — D696 Thrombocytopenia, unspecified: Secondary | ICD-10-CM | POA: Diagnosis not present

## 2023-11-01 DIAGNOSIS — Z1722 Progesterone receptor negative status: Secondary | ICD-10-CM | POA: Insufficient documentation

## 2023-11-01 DIAGNOSIS — Z5112 Encounter for antineoplastic immunotherapy: Secondary | ICD-10-CM | POA: Insufficient documentation

## 2023-11-01 DIAGNOSIS — C50311 Malignant neoplasm of lower-inner quadrant of right female breast: Secondary | ICD-10-CM | POA: Diagnosis not present

## 2023-11-01 DIAGNOSIS — Z170 Estrogen receptor positive status [ER+]: Secondary | ICD-10-CM | POA: Insufficient documentation

## 2023-11-01 DIAGNOSIS — Z79899 Other long term (current) drug therapy: Secondary | ICD-10-CM | POA: Insufficient documentation

## 2023-11-01 DIAGNOSIS — C7951 Secondary malignant neoplasm of bone: Secondary | ICD-10-CM | POA: Diagnosis present

## 2023-11-01 DIAGNOSIS — Z1732 Human epidermal growth factor receptor 2 negative status: Secondary | ICD-10-CM | POA: Diagnosis not present

## 2023-11-01 DIAGNOSIS — C787 Secondary malignant neoplasm of liver and intrahepatic bile duct: Secondary | ICD-10-CM | POA: Diagnosis not present

## 2023-11-01 DIAGNOSIS — C419 Malignant neoplasm of bone and articular cartilage, unspecified: Secondary | ICD-10-CM

## 2023-11-01 LAB — CMP (CANCER CENTER ONLY)
ALT: 28 U/L (ref 0–44)
AST: 81 U/L — ABNORMAL HIGH (ref 15–41)
Albumin: 4 g/dL (ref 3.5–5.0)
Alkaline Phosphatase: 171 U/L — ABNORMAL HIGH (ref 38–126)
Anion gap: 6 (ref 5–15)
BUN: 9 mg/dL (ref 8–23)
CO2: 26 mmol/L (ref 22–32)
Calcium: 9.3 mg/dL (ref 8.9–10.3)
Chloride: 104 mmol/L (ref 98–111)
Creatinine: 0.67 mg/dL (ref 0.44–1.00)
GFR, Estimated: >60 mL/min (ref >60)
Glucose, Bld: 128 mg/dL — ABNORMAL HIGH (ref 70–99)
Potassium: 4.3 mmol/L (ref 3.5–5.1)
Sodium: 136 mmol/L (ref 135–145)
Total Bilirubin: 1.6 mg/dL — ABNORMAL HIGH (ref 0.0–1.2)
Total Protein: 6.2 g/dL — ABNORMAL LOW (ref 6.5–8.1)

## 2023-11-01 LAB — CBC WITH DIFFERENTIAL (CANCER CENTER ONLY)
Abs Immature Granulocytes: 0.02 10*3/uL (ref 0.00–0.07)
Basophils Absolute: 0 10*3/uL (ref 0.0–0.1)
Basophils Relative: 0 %
Eosinophils Absolute: 0 10*3/uL (ref 0.0–0.5)
Eosinophils Relative: 0 %
HCT: 37.8 % (ref 36.0–46.0)
Hemoglobin: 12.7 g/dL (ref 12.0–15.0)
Immature Granulocytes: 0 %
Lymphocytes Relative: 14 %
Lymphs Abs: 0.7 10*3/uL (ref 0.7–4.0)
MCH: 34.6 pg — ABNORMAL HIGH (ref 26.0–34.0)
MCHC: 33.6 g/dL (ref 30.0–36.0)
MCV: 103 fL — ABNORMAL HIGH (ref 80.0–100.0)
Monocytes Absolute: 0.3 10*3/uL (ref 0.1–1.0)
Monocytes Relative: 6 %
Neutro Abs: 4.2 10*3/uL (ref 1.7–7.7)
Neutrophils Relative %: 80 %
Platelet Count: 49 10*3/uL — ABNORMAL LOW (ref 150–400)
RBC: 3.67 MIL/uL — ABNORMAL LOW (ref 3.87–5.11)
RDW: 16.9 % — ABNORMAL HIGH (ref 11.5–15.5)
WBC Count: 5.3 10*3/uL (ref 4.0–10.5)
nRBC: 0 % (ref 0.0–0.2)

## 2023-11-01 MED ORDER — SODIUM CHLORIDE 0.9% FLUSH: 3.0000 mL | INTRAVENOUS | Status: DC | PRN

## 2023-11-01 MED ORDER — DEXTROSE 5 % IV SOLN: Freq: Once | INTRAVENOUS | Status: AC

## 2023-11-01 MED ORDER — SODIUM CHLORIDE 0.9% FLUSH
10.0000 mL | INTRAVENOUS | Status: DC | PRN
  Administered 2023-11-01: 10 mL

## 2023-11-01 MED ORDER — HEPARIN SOD (PORK) LOCK FLUSH 100 UNIT/ML IV SOLN
500.0000 [IU] | Freq: Once | INTRAVENOUS | Status: DC | PRN
Start: 2023-11-01 — End: 2023-11-01

## 2023-11-01 MED ORDER — ALTEPLASE 2 MG IJ SOLR: 2.0000 mg | Freq: Once | INTRAMUSCULAR | Status: DC | PRN

## 2023-11-01 MED ORDER — DEXAMETHASONE SODIUM PHOSPHATE 10 MG/ML IJ SOLN
10.0000 mg | Freq: Once | INTRAMUSCULAR | Status: AC
  Administered 2023-11-01: 10 mg via INTRAVENOUS
  Filled 2023-11-01: qty 1

## 2023-11-01 MED ORDER — SODIUM CHLORIDE 0.9 % IV SOLN
150.0000 mg | Freq: Once | INTRAVENOUS | Status: AC
  Administered 2023-11-01: 150 mg via INTRAVENOUS
  Filled 2023-11-01: qty 150

## 2023-11-01 MED ORDER — SODIUM CHLORIDE 0.9% FLUSH
10.0000 mL | Freq: Once | INTRAVENOUS | Status: AC
  Administered 2023-11-01: 10 mL

## 2023-11-01 MED ORDER — ACETAMINOPHEN 325 MG PO TABS
650.0000 mg | ORAL_TABLET | Freq: Once | ORAL | Status: AC
  Administered 2023-11-01: 650 mg via ORAL
  Filled 2023-11-01: qty 2

## 2023-11-01 MED ORDER — FAM-TRASTUZUMAB DERUXTECAN-NXKI CHEMO 100 MG IV SOLR
4.4000 mg/kg | Freq: Once | INTRAVENOUS | Status: AC
  Administered 2023-11-01: 300 mg via INTRAVENOUS
  Filled 2023-11-01: qty 15

## 2023-11-01 MED ORDER — CETIRIZINE HCL 10 MG PO TABS
10.0000 mg | ORAL_TABLET | Freq: Once | ORAL | Status: AC
  Administered 2023-11-01: 10 mg via ORAL
  Filled 2023-11-01: qty 1

## 2023-11-01 MED ORDER — HEPARIN SOD (PORK) LOCK FLUSH 100 UNIT/ML IV SOLN
250.0000 [IU] | Freq: Once | INTRAVENOUS | Status: AC | PRN
  Administered 2023-11-01: 250 [IU]

## 2023-11-01 MED ORDER — PALONOSETRON HCL INJECTION 0.25 MG/5ML
0.2500 mg | Freq: Once | INTRAVENOUS | Status: AC
  Administered 2023-11-01: 0.25 mg via INTRAVENOUS
  Filled 2023-11-01: qty 5

## 2023-11-01 NOTE — Patient Instructions (Signed)

## 2023-11-01 NOTE — Assessment & Plan Note (Signed)
 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  Patchy tree-in-bud nodularity in the lungs, partially necrotic mass right hepatic lobe 12 cm, also 2.5 cm lesion lytic bone metastases in spine pelvis and left scapula   Treatment plan: Liver biopsy: Poorly diff cancer ER 30-40%, PR:0%, Her 2 Neg Verzinio with AI started 10/24/2022 discontinued May 2024 due to progression in the liver 3.  Bone Marrow Biopsy: 10/13/22: Positive for met breast cancer:   4.  Hospitalization 01/11/2023-01/21/2023: Severe jaundice due to progression in the liver 5.  Perative radiation to the spine  -------------------------------------------------------------------------------------------------------------------- Current treatment: Enhertu cycle 14 (started 01/15/2023) Enhertu toxicities: None   PET CT scan 06/11/2023: No evidence of recurrent/metastatic disease.  Cirrhotic/pseudocirrhosis liver, treated bone metastases Hospitalization 09/02/2023-09/07/2023: Shortness of breath and cough with hypoxia requiring oxygen PET CT scan scheduled for 11/18/2023   Return to clinic every 3 weeks for Enhertu and follow-ups

## 2023-11-01 NOTE — Progress Notes (Signed)
 Patient Care Team: Carylon Perches, MD as PCP - General (Internal Medicine) Almond Lint, MD as Consulting Physician (General Surgery) Lonie Peak, MD as Attending Physician (Radiation Oncology) Patton Salles, MD as Consulting Physician (Obstetrics and Gynecology) Venancio Poisson, MD as Consulting Physician (Dermatology) Crist Fat, MD as Consulting Physician (Urology) Charna Elizabeth, MD as Consulting Physician (Gastroenterology) Sheral Apley, MD as Attending Physician (Orthopedic Surgery) Serena Croissant, MD as Consulting Physician (Hematology and Oncology)  DIAGNOSIS:  Encounter Diagnosis  Name Primary?   Malignant neoplasm of lower-inner quadrant of right breast of female, estrogen receptor positive (HCC) Yes    SUMMARY OF ONCOLOGIC HISTORY: Oncology History  Malignant neoplasm of lower-inner quadrant of right breast of female, estrogen receptor positive (HCC)  07/03/2019 Initial Diagnosis   right lower inner quadrant biopsy, for a clinicaly multiple T1c N0, stage IA invasive ductal carcinoma, grade 2, E-cadherin positive, strongly estrogen receptor positive but progesterone receptor negative and HER-2 not amplified, with an MIB-1 of 5%             (a) breast MRI 08/07/2019 showed 2 additional suspicious areas in the right breast and 1 in the left breast              (b) biopsy of all 3 areas 08/21/2019 showed no malignancy   07/26/2019 -  Anti-estrogen oral therapy   tamoxifen started neoadjuvantly 07/26/2019; to be continued for 10 years.  S/p TAH/BSO on 01/16/2020 with benign pathology   08/04/2019 Genetic Testing   Negative genetic testing:  No pathogenic variants detected on the [facility] Breast Cancer STAT panel or the Common Hereditary Cancers panel. The report date is 08/04/2019.  The STAT Breast cancer panel offered by [facility] includes sequencing and rearrangement analysis for the following 9 genes:  ATM, BRCA1, BRCA2, CDH1, CHEK2, PALB2, PTEN, STK11 and  TP53.  The Common Hereditary Cancers Panel offered by [facility] includes sequencing and/or deletion duplication testing of the following 48 genes: APC, ATM, AXIN2, BARD1, BMPR1A, BRCA1, BRCA2, BRIP1, CDH1, CDK4, CDKN2A (p14ARF), CDKN2A (p16INK4a), CHEK2, CTNNA1, DICER1, EPCAM (Deletion/duplication testing only), GREM1 (promoter region deletion/duplication testing only), KIT, MEN1, MLH1, MSH2, MSH3, MSH6, MUTYH, NBN, NF1, NHTL1, PALB2, PDGFRA, PMS2, POLD1, POLE, PTEN, RAD50, RAD51C, RAD51D, RNF43, SDHB, SDHC, SDHD, SMAD4, SMARCA4. STK11, TP53, TSC1, TSC2, and VHL.  The following genes were evaluated for sequence changes only: SDHA and HOXB13 c.251G>A variant only.    10/04/2019 Cancer Staging   Staging form: Breast, AJCC 8th Edition - Pathologic stage from 10/04/2019: Stage IIB (pT2, pN1a(sn), cM0, G2, ER+, PR-, HER2-) - Signed by Loa Socks, NP on 10/18/2019   10/04/2019 Surgery    right lumpectomy and sentinel lymph node sampling 10/04/2019 showed a pT2 pN1, stage IIB invasive ductal carcinoma, grade 2, with positive lymphovascular invasion and a positive superior margin             (a) 3 sentinel lymph nodes removed, one with macro metastatic deposit, 1 with a micrometastatic deposit             (b) additional surgery for margin clearance   10/04/2019 Miscellaneous    MammaPrint on the 10/04/2019 sample shows a low risk luminal A tumor predicting a 5-year metastasis free survival of 96% without chemotherapy, and a chemotherapy benefit of less than 1.5%    11/27/2019 - 01/05/2020 Radiation Therapy    Site Technique Total Dose (Gy) Dose per Fx (Gy) Completed Fx Beam Energies  Breast, Right: Breast_Rt 3D 50/50 2 25/25  6X, 10X  Breast, Right: Breast_Rt_SCV_PAB 3D 50/50 2 25/25 6X, 10X  Breast, Right: Breast_Rt_Bst 3D 10/10 2 5/5 6X, 10X     09/29/2022 Imaging   IMPRESSION: 1. Surgical changes involving the right medial breast. No obvious recurrent breast mass or chest wall mass. 2. Mediastinal  and hilar lymphadenopathy suggesting metastatic disease. 3. Patchy tree-in-bud type nodularity in the lungs suggesting chronic inflammation or atypical infection such as MAC. No definite pulmonary metastatic nodules. 4. Large partially necrotic mass involving most of the right hepatic lobe and a 2.5 cm lesion in segment 3 of the liver consistent with metastatic disease. 5. 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   10/06/2022 Initial Biopsy   Liver biopsy: + for malignancy, metastatic poorly differentiated carcinoma, likely breast origin, biomarkers pending, insufficient tissue for molecular testing. (Resulted in EPIC on 10/13/2022)   10/07/2022 PET scan   PET scan on 10/07/2022 that showed hypermetabolic metastatic breast cancer with hypermetabolic mediastinal and hilar adenopathy, hypermetabolic hepatic metastatic disease and bone disease.  There were no findings for pulmonary metastatic disease.   10/21/2022 -  Anti-estrogen oral therapy   Verzinio and Letrozole   01/15/2023 -  Chemotherapy   Patient is on Treatment Plan : BREAST METASTATIC Fam-Trastuzumab Deruxtecan-nxki (Enhertu) (5.4) q21d     02/19/2023 Cancer Staging   Staging form: Breast, AJCC 8th Edition - Pathologic: Stage IV (pM1) - Signed by Loa Socks, NP on 02/19/2023   Cancer, metastatic to bone (HCC)  12/21/2022 Initial Diagnosis   Cancer, metastatic to bone (HCC)   01/15/2023 -  Chemotherapy   Patient is on Treatment Plan : BREAST METASTATIC Fam-Trastuzumab Deruxtecan-nxki (Enhertu) (5.4) q21d       CHIEF COMPLIANT: F/U on Enhertu  HISTORY OF PRESENT ILLNESS:   History of Present Illness The patient, with a history of met breast cancer on Enhertu C/O some SOB but she has been monitoring her oxygen levels, which have been staying in the nineties. She reports feeling generally well, with no significant bruising or excessive bleeding. She has noticed some urgency in  urination and suspects a possible urinary tract infection, but has not experienced any fever. She has been taking vitamins and vitamin C to boost her immune system. She has a PET scan scheduled and has been advised to fast before the procedure. She has been managing her symptoms and maintaining her appointments.     ALLERGIES:  is allergic to metronidazole, other, and tape.  MEDICATIONS:  Current Outpatient Medications  Medication Sig Dispense Refill   benzonatate (TESSALON) 200 MG capsule Take 1 capsule (200 mg total) by mouth 3 (three) times daily as needed for cough. 20 capsule 0   furosemide (LASIX) 20 MG tablet TAKE 1 TABLET BY MOUTH EVERY DAY WITH BREAKFAST 90 tablet 1   LORazepam (ATIVAN) 0.5 MG tablet Take 1 tablet (0.5 mg total) by mouth every 8 (eight) hours as needed. for anxiety 30 tablet 3   spironolactone (ALDACTONE) 25 MG tablet TAKE 1 TABLET (25 MG TOTAL) BY MOUTH DAILY. 90 tablet 1   valsartan (DIOVAN) 40 MG tablet Take 40 mg by mouth at bedtime.     venlafaxine XR (EFFEXOR-XR) 37.5 MG 24 hr capsule Take 1 capsule (37.5 mg total) by mouth daily. 90 capsule 4   dexamethasone (DECADRON) 4 MG tablet TAKE 1 TABLET (4 MG TOTAL) BY MOUTH DAILY. TAKE 1 TABLET DAY CHEMOTHERAPY 1 TABLET 2 DAYS AFTER CHEMO WITH FOOD (Patient not taking: Reported on 11/01/2023)  30 tablet 0   docusate sodium (COLACE) 100 MG capsule Take 1 capsule (100 mg total) by mouth daily. (Patient not taking: Reported on 11/01/2023) 30 capsule 6   omeprazole (PRILOSEC) 20 MG capsule TAKE 1 CAPSULE BY MOUTH 2 (TWO) TIMES DAILY BEFORE A MEAL. (Patient not taking: Reported on 11/01/2023) 180 capsule 3   ondansetron (ZOFRAN) 8 MG tablet Take 1 tablet (8 mg total) by mouth every 8 (eight) hours as needed for nausea or vomiting. (Patient not taking: Reported on 11/01/2023) 30 tablet 2   No current facility-administered medications for this visit.    PHYSICAL EXAMINATION: ECOG PERFORMANCE STATUS: 1 - Symptomatic but completely  ambulatory  Vitals:   11/01/23 1235  Pulse: (!) 107  Resp: 18  Temp: 97.9 F (36.6 C)  SpO2: 100%   Filed Weights   11/01/23 1235  Weight: 153 lb 9.6 oz (69.7 kg)    Physical Exam VITALS: P- 107    LABORATORY DATA:  I have reviewed the data as listed    Latest Ref Rng & Units 11/01/2023   11:36 AM 10/11/2023    1:24 PM 09/15/2023    1:24 PM  CMP  Glucose 70 - 99 mg/dL 161  096  045   BUN 8 - 23 mg/dL 9  8  11    Creatinine 0.44 - 1.00 mg/dL 4.09  8.11  9.14   Sodium 135 - 145 mmol/L 136  137  134   Potassium 3.5 - 5.1 mmol/L 4.3  4.2  4.3   Chloride 98 - 111 mmol/L 104  107  103   CO2 22 - 32 mmol/L 26  25  24    Calcium 8.9 - 10.3 mg/dL 9.3  9.1  9.6   Total Protein 6.5 - 8.1 g/dL 6.2  6.1  6.4   Total Bilirubin 0.0 - 1.2 mg/dL 1.6  1.5  1.4   Alkaline Phos 38 - 126 U/L 171  152  145   AST 15 - 41 U/L 81  62  48   ALT 0 - 44 U/L 28  22  18      Lab Results  Component Value Date   WBC 5.3 11/01/2023   HGB 12.7 11/01/2023   HCT 37.8 11/01/2023   MCV 103.0 (H) 11/01/2023   PLT 49 (L) 11/01/2023   NEUTROABS 4.2 11/01/2023    ASSESSMENT & PLAN:  Malignant neoplasm of lower-inner quadrant of right breast of female, estrogen receptor positive (HCC) 07/03/2019: Right breast LIQ T1CN0 stage Ia grade 2 IDC ER 95% PR negative HER2 negative Ki-67 5% 10/04/2019: Right lumpectomy: T2N1 stage IIb grade 2 IDC positive lymphovascular invasion 3 lymph nodes removed 1 with macro N1 micrometastatic disease 10/04/2019: MammaPrint: Low risk 01/05/2020: Completed adjuvant radiation 08/04/2019: Genetics: Negative November 2020: Tamoxifen ------------------------------------------------------------------- 09/28/2022: Patient presented with platelets 65 INR 1.9 elevated LFTs and hypercalcemia  CT CAP 09/29/2022: Mediastinal and hilar lymphadenopathy suggestive of metastatic disease.  Patchy tree-in-bud nodularity in the lungs, partially necrotic mass right hepatic lobe 12 cm, also 2.5 cm lesion  lytic bone metastases in spine pelvis and left scapula   Treatment plan: Liver biopsy: Poorly diff cancer ER 30-40%, PR:0%, Her 2 Neg Verzinio with AI started 10/24/2022 discontinued May 2024 due to progression in the liver 3.  Bone Marrow Biopsy: 10/13/22: Positive for met breast cancer:   4.  Hospitalization 01/11/2023-01/21/2023: Severe jaundice due to progression in the liver 5.  Perative radiation to the spine  -------------------------------------------------------------------------------------------------------------------- Current treatment: Enhertu cycle 14 (started 01/15/2023)  Enhertu toxicities: None   PET CT scan 06/11/2023: No evidence of recurrent/metastatic disease.  Cirrhotic/pseudocirrhosis liver, treated bone metastases Hospitalization 09/02/2023-09/07/2023: Shortness of breath and cough with hypoxia requiring oxygen PET CT scan scheduled for 11/18/2023   Return to clinic every 3 weeks for Enhertu and follow-ups ------------------------------------- Assessment and Plan Assessment & Plan Thrombocytopenia Chronic thrombocytopenia with platelet count at 49,000/L. No significant bleeding symptoms. Well-managed on current medication dose. Future dose adjustment considered if platelet count decreases significantly. PET scan results will guide treatment decisions. - Continue current medication dose. - Evaluate platelet count regularly. - Review PET scan results before making any medication changes.  Upcoming PET scan Scheduled PET scan on November 18, 2023, to assess current condition. She instructed on pre-scan fasting requirements. - Ensure fasting before PET scan on November 18, 2023.  Follow-up Regular follow-up appointments maintained. Next appointment scheduled for November 22, 2023. - Attend follow-up appointment on November 22, 2023.      No orders of the defined types were placed in this encounter.  The patient has a good understanding of the overall plan. she agrees with it.  she will call with any problems that may develop before the next visit here. Total time spent: 30 mins including face to face time and time spent for planning, charting and co-ordination of care   Tamsen Meek, MD 11/01/23

## 2023-11-03 ENCOUNTER — Other Ambulatory Visit: Payer: Self-pay

## 2023-11-04 ENCOUNTER — Other Ambulatory Visit: Payer: Self-pay | Admitting: Hematology and Oncology

## 2023-11-04 DIAGNOSIS — R180 Malignant ascites: Secondary | ICD-10-CM

## 2023-11-04 DIAGNOSIS — Z170 Estrogen receptor positive status [ER+]: Secondary | ICD-10-CM

## 2023-11-08 ENCOUNTER — Ambulatory Visit: Payer: Medicare HMO

## 2023-11-08 ENCOUNTER — Other Ambulatory Visit: Payer: Medicare HMO

## 2023-11-08 ENCOUNTER — Ambulatory Visit: Payer: Medicare HMO | Admitting: Adult Health

## 2023-11-15 ENCOUNTER — Telehealth: Payer: Self-pay

## 2023-11-15 NOTE — Telephone Encounter (Signed)
 Pt called to report she has new bruising. Denies sx of a hematoma, epistaxis. She is having a PET scan Thursday and just wanted to make [place] aware of the new bruising. She knows to call if she has epistaxis or signs of a hematoma.

## 2023-11-17 ENCOUNTER — Other Ambulatory Visit: Payer: Self-pay

## 2023-11-18 ENCOUNTER — Ambulatory Visit (HOSPITAL_COMMUNITY)
Admission: RE | Admit: 2023-11-18 | Discharge: 2023-11-18 | Disposition: A | Discharge status: Home / Self Care | Payer: Medicare HMO | Source: Ambulatory Visit | Attending: Hematology and Oncology | Admitting: Hematology and Oncology

## 2023-11-18 DIAGNOSIS — R180 Malignant ascites: Secondary | ICD-10-CM | POA: Diagnosis present

## 2023-11-18 DIAGNOSIS — Z170 Estrogen receptor positive status [ER+]: Secondary | ICD-10-CM | POA: Diagnosis present

## 2023-11-18 DIAGNOSIS — C7951 Secondary malignant neoplasm of bone: Secondary | ICD-10-CM | POA: Diagnosis present

## 2023-11-18 DIAGNOSIS — C50311 Malignant neoplasm of lower-inner quadrant of right female breast: Secondary | ICD-10-CM | POA: Insufficient documentation

## 2023-11-18 LAB — GLUCOSE, CAPILLARY: Glucose-Capillary: 129 mg/dL — ABNORMAL HIGH (ref 70–99)

## 2023-11-18 MED ORDER — FLUDEOXYGLUCOSE F - 18 (FDG) INJECTION
7.6500 | Freq: Once | INTRAVENOUS | Status: AC | PRN
  Administered 2023-11-18: 7.65 via INTRAVENOUS

## 2023-11-19 MED FILL — Fosaprepitant Dimeglumine For IV Infusion 150 MG (Base Eq): INTRAVENOUS | Qty: 5 | Status: AC

## 2023-11-22 ENCOUNTER — Inpatient Hospital Stay: Payer: Medicare HMO

## 2023-11-22 ENCOUNTER — Other Ambulatory Visit: Payer: Self-pay

## 2023-11-22 ENCOUNTER — Ambulatory Visit: Payer: Medicare HMO | Admitting: Adult Health

## 2023-11-22 ENCOUNTER — Inpatient Hospital Stay: Admitting: Hematology and Oncology

## 2023-11-22 VITALS — BP 125/72 | HR 113 | Temp 97.9°F | Resp 19 | Ht 65.0 in | Wt 151.9 lb

## 2023-11-22 DIAGNOSIS — R180 Malignant ascites: Secondary | ICD-10-CM

## 2023-11-22 DIAGNOSIS — C7951 Secondary malignant neoplasm of bone: Secondary | ICD-10-CM

## 2023-11-22 DIAGNOSIS — Z170 Estrogen receptor positive status [ER+]: Secondary | ICD-10-CM | POA: Diagnosis not present

## 2023-11-22 DIAGNOSIS — C419 Malignant neoplasm of bone and articular cartilage, unspecified: Secondary | ICD-10-CM

## 2023-11-22 DIAGNOSIS — C50311 Malignant neoplasm of lower-inner quadrant of right female breast: Secondary | ICD-10-CM

## 2023-11-22 DIAGNOSIS — Z5112 Encounter for antineoplastic immunotherapy: Secondary | ICD-10-CM | POA: Diagnosis not present

## 2023-11-22 LAB — CBC WITH DIFFERENTIAL (CANCER CENTER ONLY)
Abs Immature Granulocytes: 0.05 10*3/uL (ref 0.00–0.07)
Basophils Absolute: 0 10*3/uL (ref 0.0–0.1)
Basophils Relative: 0 %
Eosinophils Absolute: 0.1 10*3/uL (ref 0.0–0.5)
Eosinophils Relative: 1 %
HCT: 36.6 % (ref 36.0–46.0)
Hemoglobin: 12.6 g/dL (ref 12.0–15.0)
Immature Granulocytes: 1 %
Lymphocytes Relative: 21 %
Lymphs Abs: 1.1 10*3/uL (ref 0.7–4.0)
MCH: 35 pg — ABNORMAL HIGH (ref 26.0–34.0)
MCHC: 34.4 g/dL (ref 30.0–36.0)
MCV: 101.7 fL — ABNORMAL HIGH (ref 80.0–100.0)
Monocytes Absolute: 0.7 10*3/uL (ref 0.1–1.0)
Monocytes Relative: 13 %
Neutro Abs: 3.3 10*3/uL (ref 1.7–7.7)
Neutrophils Relative %: 64 %
Platelet Count: 50 10*3/uL — ABNORMAL LOW (ref 150–400)
RBC: 3.6 MIL/uL — ABNORMAL LOW (ref 3.87–5.11)
RDW: 19.1 % — ABNORMAL HIGH (ref 11.5–15.5)
WBC Count: 5.3 10*3/uL (ref 4.0–10.5)
nRBC: 0.6 % — ABNORMAL HIGH (ref 0.0–0.2)

## 2023-11-22 LAB — CMP (CANCER CENTER ONLY)
ALT: 33 U/L (ref 0–44)
AST: 125 U/L — ABNORMAL HIGH (ref 15–41)
Albumin: 3.9 g/dL (ref 3.5–5.0)
Alkaline Phosphatase: 191 U/L — ABNORMAL HIGH (ref 38–126)
Anion gap: 9 (ref 5–15)
BUN: 10 mg/dL (ref 8–23)
CO2: 22 mmol/L (ref 22–32)
Calcium: 9 mg/dL (ref 8.9–10.3)
Chloride: 103 mmol/L (ref 98–111)
Creatinine: 0.69 mg/dL (ref 0.44–1.00)
GFR, Estimated: >60 mL/min (ref >60)
Glucose, Bld: 122 mg/dL — ABNORMAL HIGH (ref 70–99)
Potassium: 3.9 mmol/L (ref 3.5–5.1)
Sodium: 134 mmol/L — ABNORMAL LOW (ref 135–145)
Total Bilirubin: 2.5 mg/dL — ABNORMAL HIGH (ref 0.0–1.2)
Total Protein: 6.1 g/dL — ABNORMAL LOW (ref 6.5–8.1)

## 2023-11-22 MED ORDER — METHYLPREDNISOLONE SODIUM SUCC 125 MG IJ SOLR: 125.0000 mg | Freq: Once | INTRAMUSCULAR | Status: DC | PRN

## 2023-11-22 MED ORDER — ALBUTEROL SULFATE HFA 108 (90 BASE) MCG/ACT IN AERS: 2.0000 | INHALATION_SPRAY | Freq: Once | RESPIRATORY_TRACT | Status: DC | PRN

## 2023-11-22 MED ORDER — EPINEPHRINE 0.3 MG/0.3ML IJ SOAJ: 0.3000 mg | Freq: Once | INTRAMUSCULAR | Status: DC | PRN

## 2023-11-22 MED ORDER — LOPERAMIDE HCL 2 MG PO CAPS: ORAL_CAPSULE | ORAL | 3 refills | Status: DC

## 2023-11-22 MED ORDER — LIDOCAINE-PRILOCAINE 2.5-2.5 % EX CREA: TOPICAL_CREAM | CUTANEOUS | 3 refills | Status: DC

## 2023-11-22 MED ORDER — HEPARIN SOD (PORK) LOCK FLUSH 100 UNIT/ML IV SOLN
500.0000 [IU] | Freq: Once | INTRAVENOUS | Status: AC
  Administered 2023-11-22: 500 [IU]

## 2023-11-22 MED ORDER — DIPHENHYDRAMINE HCL 50 MG/ML IJ SOLN
50.0000 mg | Freq: Once | INTRAMUSCULAR | Status: DC | PRN
Start: 2023-11-22 — End: 2023-11-22

## 2023-11-22 MED ORDER — SODIUM CHLORIDE 0.9 % IV SOLN: Freq: Once | INTRAVENOUS | Status: DC | PRN

## 2023-11-22 MED ORDER — FAMOTIDINE IN NACL 20-0.9 MG/50ML-% IV SOLN: 20.0000 mg | Freq: Once | INTRAVENOUS | Status: DC | PRN

## 2023-11-22 MED ORDER — SODIUM CHLORIDE 0.9% FLUSH
10.0000 mL | Freq: Once | INTRAVENOUS | Status: AC
  Administered 2023-11-22: 10 mL

## 2023-11-22 MED ORDER — DEXAMETHASONE 4 MG PO TABS: ORAL_TABLET | ORAL | 1 refills | Status: DC

## 2023-11-22 NOTE — Assessment & Plan Note (Signed)
 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  Patchy tree-in-bud nodularity in the lungs, partially necrotic mass right hepatic lobe 12 cm, also 2.5 cm lesion lytic bone metastases in spine pelvis and left scapula   Treatment plan: Liver biopsy: Poorly diff cancer ER 30-40%, PR:0%, Her 2 Neg Verzinio with AI started 10/24/2022 discontinued May 2024 due to progression in the liver 3.  Bone Marrow Biopsy: 10/13/22: Positive for met breast cancer:   4.  Hospitalization 01/11/2023-01/21/2023: Severe jaundice due to progression in the liver 5.  Perative radiation to the spine  -------------------------------------------------------------------------------------------------------------------- Current treatment: Enhertu cycle 15 (started 01/15/2023) Enhertu toxicities: None   PET CT scan 06/11/2023: No evidence of recurrent/metastatic disease.  Cirrhotic/pseudocirrhosis liver, treated bone metastases Hospitalization 09/02/2023-09/07/2023: Shortness of breath and cough with hypoxia requiring oxygen PET CT scan scheduled for 11/18/2023   Return to clinic every 3 weeks for Enhertu and follow-ups

## 2023-11-22 NOTE — Progress Notes (Signed)
 Guardant 360 + PDL1 submitted and faxed per MD.

## 2023-11-22 NOTE — Progress Notes (Signed)
 Patient Care Team: Carylon Perches, MD as PCP - General (Internal Medicine) Almond Lint, MD as Consulting Physician (General Surgery) Lonie Peak, MD as Attending Physician (Radiation Oncology) Patton Salles, MD as Consulting Physician (Obstetrics and Gynecology) Venancio Poisson, MD as Consulting Physician (Dermatology) Crist Fat, MD as Consulting Physician (Urology) Charna Elizabeth, MD as Consulting Physician (Gastroenterology) Sheral Apley, MD as Attending Physician (Orthopedic Surgery) Serena Croissant, MD as Consulting Physician (Hematology and Oncology)  DIAGNOSIS:  Encounter Diagnosis  Name Primary?   Malignant neoplasm of lower-inner quadrant of right breast of female, estrogen receptor positive (HCC) Yes    SUMMARY OF ONCOLOGIC HISTORY: Oncology History  Malignant neoplasm of lower-inner quadrant of right breast of female, estrogen receptor positive (HCC)  07/03/2019 Initial Diagnosis   right lower inner quadrant biopsy, for a clinicaly multiple T1c N0, stage IA invasive ductal carcinoma, grade 2, E-cadherin positive, strongly estrogen receptor positive but progesterone receptor negative and HER-2 not amplified, with an MIB-1 of 5%             (a) breast MRI 08/07/2019 showed 2 additional suspicious areas in the right breast and 1 in the left breast              (b) biopsy of all 3 areas 08/21/2019 showed no malignancy   07/26/2019 -  Anti-estrogen oral therapy   tamoxifen started neoadjuvantly 07/26/2019; to be continued for 10 years.  S/p TAH/BSO on 01/16/2020 with benign pathology   08/04/2019 Genetic Testing   Negative genetic testing:  No pathogenic variants detected on the [facility] Breast Cancer STAT panel or the Common Hereditary Cancers panel. The report date is 08/04/2019.  The STAT Breast cancer panel offered by [facility] includes sequencing and rearrangement analysis for the following 9 genes:  ATM, BRCA1, BRCA2, CDH1, CHEK2, PALB2, PTEN, STK11 and  TP53.  The Common Hereditary Cancers Panel offered by [facility] includes sequencing and/or deletion duplication testing of the following 48 genes: APC, ATM, AXIN2, BARD1, BMPR1A, BRCA1, BRCA2, BRIP1, CDH1, CDK4, CDKN2A (p14ARF), CDKN2A (p16INK4a), CHEK2, CTNNA1, DICER1, EPCAM (Deletion/duplication testing only), GREM1 (promoter region deletion/duplication testing only), KIT, MEN1, MLH1, MSH2, MSH3, MSH6, MUTYH, NBN, NF1, NHTL1, PALB2, PDGFRA, PMS2, POLD1, POLE, PTEN, RAD50, RAD51C, RAD51D, RNF43, SDHB, SDHC, SDHD, SMAD4, SMARCA4. STK11, TP53, TSC1, TSC2, and VHL.  The following genes were evaluated for sequence changes only: SDHA and HOXB13 c.251G>A variant only.    10/04/2019 Cancer Staging   Staging form: Breast, AJCC 8th Edition - Pathologic stage from 10/04/2019: Stage IIB (pT2, pN1a(sn), cM0, G2, ER+, PR-, HER2-) - Signed by Loa Socks, NP on 10/18/2019   10/04/2019 Surgery    right lumpectomy and sentinel lymph node sampling 10/04/2019 showed a pT2 pN1, stage IIB invasive ductal carcinoma, grade 2, with positive lymphovascular invasion and a positive superior margin             (a) 3 sentinel lymph nodes removed, one with macro metastatic deposit, 1 with a micrometastatic deposit             (b) additional surgery for margin clearance   10/04/2019 Miscellaneous    MammaPrint on the 10/04/2019 sample shows a low risk luminal A tumor predicting a 5-year metastasis free survival of 96% without chemotherapy, and a chemotherapy benefit of less than 1.5%    11/27/2019 - 01/05/2020 Radiation Therapy    Site Technique Total Dose (Gy) Dose per Fx (Gy) Completed Fx Beam Energies  Breast, Right: Breast_Rt 3D 50/50 2 25/25  6X, 10X  Breast, Right: Breast_Rt_SCV_PAB 3D 50/50 2 25/25 6X, 10X  Breast, Right: Breast_Rt_Bst 3D 10/10 2 5/5 6X, 10X     09/29/2022 Imaging   IMPRESSION: 1. Surgical changes involving the right medial breast. No obvious recurrent breast mass or chest wall mass. 2. Mediastinal  and hilar lymphadenopathy suggesting metastatic disease. 3. Patchy tree-in-bud type nodularity in the lungs suggesting chronic inflammation or atypical infection such as MAC. No definite pulmonary metastatic nodules. 4. Large partially necrotic mass involving most of the right hepatic lobe and a 2.5 cm lesion in segment 3 of the liver consistent with metastatic disease. 5. 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   10/06/2022 Initial Biopsy   Liver biopsy: + for malignancy, metastatic poorly differentiated carcinoma, likely breast origin, biomarkers pending, insufficient tissue for molecular testing. (Resulted in EPIC on 10/13/2022)   10/07/2022 PET scan   PET scan on 10/07/2022 that showed hypermetabolic metastatic breast cancer with hypermetabolic mediastinal and hilar adenopathy, hypermetabolic hepatic metastatic disease and bone disease.  There were no findings for pulmonary metastatic disease.   10/21/2022 -  Anti-estrogen oral therapy   Verzinio and Letrozole   01/15/2023 -  Chemotherapy   Patient is on Treatment Plan : BREAST METASTATIC Fam-Trastuzumab Deruxtecan-nxki (Enhertu) (5.4) q21d     02/19/2023 Cancer Staging   Staging form: Breast, AJCC 8th Edition - Pathologic: Stage IV (pM1) - Signed by Loa Socks, NP on 02/19/2023   Cancer, metastatic to bone (HCC)  12/21/2022 Initial Diagnosis   Cancer, metastatic to bone (HCC)   01/15/2023 -  Chemotherapy   Patient is on Treatment Plan : BREAST METASTATIC Fam-Trastuzumab Deruxtecan-nxki (Enhertu) (5.4) q21d       CHIEF COMPLIANT: Follow-up to discuss her treatment plan after recent scans  HISTORY OF PRESENT ILLNESS: Theresa [name] is a 68 year old with a history of metastatic breast cancer currently on treatment with Enhertu.  She had recent scans and is here today to discuss results.  Over the past 3 weeks she has gotten much more tired.  She has noted some bruises.  Overall  experiencing more fatigue and nausea.   ALLERGIES:  is allergic to metronidazole, other, and tape.  MEDICATIONS:  Current Outpatient Medications  Medication Sig Dispense Refill   benzonatate (TESSALON) 200 MG capsule Take 1 capsule (200 mg total) by mouth 3 (three) times daily as needed for cough. 20 capsule 0   dexamethasone (DECADRON) 4 MG tablet TAKE 1 TABLET (4 MG TOTAL) BY MOUTH DAILY. TAKE 1 TABLET DAY CHEMOTHERAPY 1 TABLET 2 DAYS AFTER CHEMO WITH FOOD (Patient not taking: Reported on 11/01/2023) 30 tablet 0   docusate sodium (COLACE) 100 MG capsule Take 1 capsule (100 mg total) by mouth daily. (Patient not taking: Reported on 11/01/2023) 30 capsule 6   furosemide (LASIX) 20 MG tablet TAKE 1 TABLET BY MOUTH EVERY DAY WITH BREAKFAST 90 tablet 1   LORazepam (ATIVAN) 0.5 MG tablet Take 1 tablet (0.5 mg total) by mouth every 8 (eight) hours as needed. for anxiety 30 tablet 3   omeprazole (PRILOSEC) 20 MG capsule TAKE 1 CAPSULE BY MOUTH 2 (TWO) TIMES DAILY BEFORE A MEAL. (Patient not taking: Reported on 11/01/2023) 180 capsule 3   ondansetron (ZOFRAN) 8 MG tablet Take 1 tablet (8 mg total) by mouth every 8 (eight) hours as needed for nausea or vomiting. (Patient not taking: Reported on 11/01/2023) 30 tablet 2   spironolactone (ALDACTONE) 25 MG tablet TAKE 1 TABLET (25 MG TOTAL)  BY MOUTH DAILY. 90 tablet 1   valsartan (DIOVAN) 40 MG tablet Take 40 mg by mouth at bedtime.     venlafaxine XR (EFFEXOR-XR) 37.5 MG 24 hr capsule Take 1 capsule (37.5 mg total) by mouth daily. 90 capsule 4   No current facility-administered medications for this visit.    PHYSICAL EXAMINATION: ECOG PERFORMANCE STATUS: 1 - Symptomatic but completely ambulatory  Vitals:   11/22/23 0841  BP: 125/72  Pulse: (!) 113  Resp: 19  Temp: 97.9 F (36.6 C)  SpO2: 95%   Filed Weights   11/22/23 0841  Weight: 151 lb 14.4 oz (68.9 kg)      LABORATORY DATA:  I have reviewed the data as listed    Latest Ref Rng & Units  11/01/2023   11:36 AM 10/11/2023    1:24 PM 09/15/2023    1:24 PM  CMP  Glucose 70 - 99 mg/dL 098  119  147   BUN 8 - 23 mg/dL 9  8  11    Creatinine 0.44 - 1.00 mg/dL 8.29  5.62  1.30   Sodium 135 - 145 mmol/L 136  137  134   Potassium 3.5 - 5.1 mmol/L 4.3  4.2  4.3   Chloride 98 - 111 mmol/L 104  107  103   CO2 22 - 32 mmol/L 26  25  24    Calcium 8.9 - 10.3 mg/dL 9.3  9.1  9.6   Total Protein 6.5 - 8.1 g/dL 6.2  6.1  6.4   Total Bilirubin 0.0 - 1.2 mg/dL 1.6  1.5  1.4   Alkaline Phos 38 - 126 U/L 171  152  145   AST 15 - 41 U/L 81  62  48   ALT 0 - 44 U/L 28  22  18      Lab Results  Component Value Date   WBC 5.3 11/01/2023   HGB 12.7 11/01/2023   HCT 37.8 11/01/2023   MCV 103.0 (H) 11/01/2023   PLT 49 (L) 11/01/2023   NEUTROABS 4.2 11/01/2023    ASSESSMENT & PLAN:  Malignant neoplasm of lower-inner quadrant of right breast of female, estrogen receptor positive (HCC) 07/03/2019: Right breast LIQ T1CN0 stage Ia grade 2 IDC ER 95% PR negative HER2 negative Ki-67 5% 10/04/2019: Right lumpectomy: T2N1 stage IIb grade 2 IDC positive lymphovascular invasion 3 lymph nodes removed 1 with macro N1 micrometastatic disease 10/04/2019: MammaPrint: Low risk 01/05/2020: Completed adjuvant radiation 08/04/2019: Genetics: Negative November 2020: Tamoxifen ------------------------------------------------------------------- 09/28/2022: Patient presented with platelets 65 INR 1.9 elevated LFTs and hypercalcemia  CT CAP 09/29/2022: Mediastinal and hilar lymphadenopathy suggestive of metastatic disease.  Patchy tree-in-bud nodularity in the lungs, partially necrotic mass right hepatic lobe 12 cm, also 2.5 cm lesion lytic bone metastases in spine pelvis and left scapula   Treatment plan: Liver biopsy: Poorly diff cancer ER 30-40%, PR:0%, Her 2 Neg Verzinio with AI started 10/24/2022 discontinued May 2024 due to progression in the liver 3.  Bone Marrow Biopsy: 10/13/22: Positive for met breast cancer:   4.   Hospitalization 01/11/2023-01/21/2023: Severe jaundice due to progression in the liver 5.  Perative radiation to the spine  -------------------------------------------------------------------------------------------------------------------- Current treatment: Enhertu cycle 15 (started 01/15/2023) being discontinued because of progression of disease   PET CT scan 06/11/2023: No evidence of recurrent/metastatic disease.  Cirrhotic/pseudocirrhosis liver, treated bone metastases Hospitalization 09/02/2023-09/07/2023: Shortness of breath and cough with hypoxia requiring oxygen PET CT scan: 11/19/2023: Concern for recurrence of liver metastases with 2 new hypermetabolic lesions.  New multifocal radiotracer activity in the marrow spaces.   Based upon the symptoms it is clearly evident that patient is progressing and therefore I recommend switching her treatment. Laqueta Linden will be obtained today.   Sacituzumab-Govitecan counseling: I discussed with the patient that this drug is an antibody drug conjugate. Its toxicities are related to neutropenia and diarrhea and hypersensitivity reactions primarily.  The range of adverse effects can be quite extensive including's edema, skin reactions, anemia, elevation of LFTs, dizziness fatigue joint pains and dyspnea.  Diarrhea can be quite prevalent and up to 62% of patients.  Hypersensitivity reactions and 37%.  Grade 3 and 4 neutropenia and 26% of patients.  Phase 2 study showed one third of patients who got this drug had a response that lasted an average of 7.7 months.  We will start this treatment next week.    No orders of the defined types were placed in this encounter.  The patient has a good understanding of the overall plan. she agrees with it. she will call with any problems that may develop before the next visit here. Total time spent: 30 mins including face to face time and time spent for planning, charting and co-ordination of care   Theresa Meek,  MD 11/22/23

## 2023-11-22 NOTE — Addendum Note (Signed)
 Addended by: Elwanda Brooklyn on: 11/22/2023 10:25 AM   Modules accepted: Orders

## 2023-11-22 NOTE — Addendum Note (Signed)
 Addended by: De Blanch on: 11/22/2023 10:12 AM   Modules accepted: Orders

## 2023-11-22 NOTE — Progress Notes (Signed)
 DISCONTINUE ON PATHWAY REGIMEN - Breast     A cycle is every 21 days:     Fam-trastuzumab deruxtecan-nxki   **Always confirm dose/schedule in your pharmacy ordering system**  PRIOR TREATMENT: [id number]: Fam-trastuzumab Deruxtecan 5.4 mg/kg q21 Days  START ON PATHWAY REGIMEN - Breast     A cycle is every 21 days:     Sacituzumab govitecan-hziy   **Always confirm dose/schedule in your pharmacy ordering system**  Patient Characteristics: Distant Metastases or Locoregional Recurrent Disease - Unresected, M0 or Locally Advanced Unresectable Disease Progressing after Neoadjuvant and Local Therapies, M0, HER2 Low/Negative, ER Positive, Chemotherapy, HER2 Low, Third Line and Beyond, [employer] or Not a Candidate for Molecular Targeted Therapy Therapeutic Status: Distant Metastases HER2 Status: Low ER Status: Positive (+) PR Status: Positive (+) Therapy Approach Indicated: Standard Chemotherapy/Endocrine Therapy Line of Therapy: Third Line and Beyond Intent of Therapy: Non-Curative / Palliative Intent, Discussed with Patient

## 2023-11-23 LAB — CANCER ANTIGEN 27.29: CA 27.29: 714.8 U/mL — ABNORMAL HIGH (ref 0.0–38.6)

## 2023-11-24 ENCOUNTER — Other Ambulatory Visit: Payer: Self-pay | Admitting: Hematology and Oncology

## 2023-11-26 MED FILL — Fosaprepitant Dimeglumine For IV Infusion 150 MG (Base Eq): INTRAVENOUS | Qty: 5 | Status: AC

## 2023-11-29 ENCOUNTER — Ambulatory Visit: Payer: Medicare HMO

## 2023-11-29 ENCOUNTER — Ambulatory Visit: Payer: Medicare HMO | Admitting: Hematology and Oncology

## 2023-11-29 ENCOUNTER — Inpatient Hospital Stay

## 2023-11-29 ENCOUNTER — Other Ambulatory Visit: Payer: Medicare HMO

## 2023-11-29 ENCOUNTER — Inpatient Hospital Stay (HOSPITAL_BASED_OUTPATIENT_CLINIC_OR_DEPARTMENT_OTHER): Admitting: Hematology and Oncology

## 2023-11-29 VITALS — BP 127/79 | HR 103 | Resp 16

## 2023-11-29 VITALS — BP 127/67 | HR 120 | Temp 97.3°F | Resp 19 | Ht 65.0 in | Wt 151.1 lb

## 2023-11-29 DIAGNOSIS — Z170 Estrogen receptor positive status [ER+]: Secondary | ICD-10-CM

## 2023-11-29 DIAGNOSIS — C7951 Secondary malignant neoplasm of bone: Secondary | ICD-10-CM

## 2023-11-29 DIAGNOSIS — C50311 Malignant neoplasm of lower-inner quadrant of right female breast: Secondary | ICD-10-CM

## 2023-11-29 DIAGNOSIS — Z5112 Encounter for antineoplastic immunotherapy: Secondary | ICD-10-CM | POA: Diagnosis not present

## 2023-11-29 DIAGNOSIS — C419 Malignant neoplasm of bone and articular cartilage, unspecified: Secondary | ICD-10-CM

## 2023-11-29 LAB — CMP (CANCER CENTER ONLY)
ALT: 32 U/L (ref 0–44)
AST: 171 U/L (ref 15–41)
Albumin: 3.9 g/dL (ref 3.5–5.0)
Alkaline Phosphatase: 219 U/L — ABNORMAL HIGH (ref 38–126)
Anion gap: 11 (ref 5–15)
BUN: 11 mg/dL (ref 8–23)
CO2: 24 mmol/L (ref 22–32)
Calcium: 9.5 mg/dL (ref 8.9–10.3)
Chloride: 102 mmol/L (ref 98–111)
Creatinine: 0.8 mg/dL (ref 0.44–1.00)
GFR, Estimated: >60 mL/min (ref >60)
Glucose, Bld: 138 mg/dL — ABNORMAL HIGH (ref 70–99)
Potassium: 4.1 mmol/L (ref 3.5–5.1)
Sodium: 137 mmol/L (ref 135–145)
Total Bilirubin: 3.7 mg/dL (ref 0.0–1.2)
Total Protein: 6.2 g/dL — ABNORMAL LOW (ref 6.5–8.1)

## 2023-11-29 LAB — CBC WITH DIFFERENTIAL (CANCER CENTER ONLY)
Abs Immature Granulocytes: 0.35 10*3/uL — ABNORMAL HIGH (ref 0.00–0.07)
Basophils Absolute: 0.1 10*3/uL (ref 0.0–0.1)
Basophils Relative: 1 %
Eosinophils Absolute: 0.1 10*3/uL (ref 0.0–0.5)
Eosinophils Relative: 1 %
HCT: 39 % (ref 36.0–46.0)
Hemoglobin: 13.3 g/dL (ref 12.0–15.0)
Immature Granulocytes: 5 %
Lymphocytes Relative: 17 %
Lymphs Abs: 1.2 10*3/uL (ref 0.7–4.0)
MCH: 35 pg — ABNORMAL HIGH (ref 26.0–34.0)
MCHC: 34.1 g/dL (ref 30.0–36.0)
MCV: 102.6 fL — ABNORMAL HIGH (ref 80.0–100.0)
Monocytes Absolute: 1 10*3/uL (ref 0.1–1.0)
Monocytes Relative: 14 %
Neutro Abs: 4.5 10*3/uL (ref 1.7–7.7)
Neutrophils Relative %: 62 %
Platelet Count: 40 10*3/uL — ABNORMAL LOW (ref 150–400)
RBC: 3.8 MIL/uL — ABNORMAL LOW (ref 3.87–5.11)
RDW: 19.7 % — ABNORMAL HIGH (ref 11.5–15.5)
WBC Count: 7.2 10*3/uL (ref 4.0–10.5)
nRBC: 1.5 % — ABNORMAL HIGH (ref 0.0–0.2)

## 2023-11-29 LAB — MAGNESIUM: Magnesium: 1.8 mg/dL (ref 1.7–2.4)

## 2023-11-29 LAB — PHOSPHORUS: Phosphorus: 3.9 mg/dL (ref 2.5–4.6)

## 2023-11-29 MED ORDER — DIPHENHYDRAMINE HCL 50 MG/ML IJ SOLN
25.0000 mg | Freq: Once | INTRAMUSCULAR | Status: AC
  Administered 2023-11-29: 25 mg via INTRAVENOUS
  Filled 2023-11-29: qty 1

## 2023-11-29 MED ORDER — DEXAMETHASONE SODIUM PHOSPHATE 10 MG/ML IJ SOLN
10.0000 mg | Freq: Once | INTRAMUSCULAR | Status: AC
  Administered 2023-11-29: 10 mg via INTRAVENOUS
  Filled 2023-11-29: qty 1

## 2023-11-29 MED ORDER — SODIUM CHLORIDE 0.9 % IV SOLN
INTRAVENOUS | Status: DC
Start: 2023-11-29 — End: 2023-11-29

## 2023-11-29 MED ORDER — HEPARIN SOD (PORK) LOCK FLUSH 100 UNIT/ML IV SOLN
500.0000 [IU] | Freq: Once | INTRAVENOUS | Status: DC | PRN
Start: 2023-11-29 — End: 2023-11-29

## 2023-11-29 MED ORDER — SODIUM CHLORIDE 0.9% FLUSH
10.0000 mL | Freq: Once | INTRAVENOUS | Status: AC
  Administered 2023-11-29: 10 mL

## 2023-11-29 MED ORDER — SODIUM CHLORIDE 0.9 % IV SOLN
7.5000 mg/kg | Freq: Once | INTRAVENOUS | Status: AC
  Administered 2023-11-29: 540 mg via INTRAVENOUS
  Filled 2023-11-29: qty 54

## 2023-11-29 MED ORDER — FAMOTIDINE IN NACL 20-0.9 MG/50ML-% IV SOLN
20.0000 mg | Freq: Once | INTRAVENOUS | Status: AC
  Administered 2023-11-29: 20 mg via INTRAVENOUS
  Filled 2023-11-29: qty 50

## 2023-11-29 MED ORDER — PALONOSETRON HCL INJECTION 0.25 MG/5ML
0.2500 mg | Freq: Once | INTRAVENOUS | Status: AC
  Administered 2023-11-29: 0.25 mg via INTRAVENOUS
  Filled 2023-11-29: qty 5

## 2023-11-29 MED ORDER — SODIUM CHLORIDE 0.9% FLUSH: 10.0000 mL | INTRAVENOUS | Status: DC | PRN

## 2023-11-29 MED ORDER — SODIUM CHLORIDE 0.9 % IV SOLN
150.0000 mg | Freq: Once | INTRAVENOUS | Status: AC
  Administered 2023-11-29: 150 mg via INTRAVENOUS
  Filled 2023-11-29: qty 150

## 2023-11-29 MED ORDER — METHOCARBAMOL 750 MG PO TABS: 750.0000 mg | ORAL_TABLET | Freq: Three times a day (TID) | ORAL | 0 refills | Status: DC | PRN

## 2023-11-29 MED ORDER — ONDANSETRON HCL 8 MG PO TABS: 8.0000 mg | ORAL_TABLET | Freq: Three times a day (TID) | ORAL | 2 refills | Status: DC | PRN

## 2023-11-29 MED ORDER — ACETAMINOPHEN 325 MG PO TABS
650.0000 mg | ORAL_TABLET | Freq: Once | ORAL | Status: AC
  Administered 2023-11-29: 650 mg via ORAL
  Filled 2023-11-29: qty 2

## 2023-11-29 NOTE — Assessment & Plan Note (Signed)
 07/03/2019: Right breast LIQ T1CN0 stage Ia grade 2 IDC ER 95% PR negative HER2 negative Ki-67 5% 10/04/2019: Right lumpectomy: T2N1 stage IIb grade 2 IDC positive lymphovascular invasion 3 lymph nodes removed 1 with macro N1 micrometastatic disease 10/04/2019: MammaPrint: Low risk 01/05/2020: Completed adjuvant radiation 08/04/2019: Genetics: Negative November 2020: Tamoxifen 01/15/2023-11/29/2023: Enhertu ------------------------------------------------------------------- 09/28/2022: Patient presented with platelets 65 INR 1.9 elevated LFTs and hypercalcemia  CT CAP 09/29/2022: Mediastinal and hilar lymphadenopathy suggestive of metastatic disease.  Patchy tree-in-bud nodularity in the lungs, partially necrotic mass right hepatic lobe 12 cm, also 2.5 cm lesion lytic bone metastases in spine pelvis and left scapula   Treatment plan: Liver biopsy: Poorly diff cancer ER 30-40%, PR:0%, Her 2 Neg Verzinio with AI started 10/24/2022 discontinued May 2024 due to progression in the liver 3.  Bone Marrow Biopsy: 10/13/22: Positive for met breast cancer:   4.  Hospitalization 01/11/2023-01/21/2023: Severe jaundice due to progression in the liver 5.  Perative radiation to the spine 6. PET CT scan: 11/19/2023: liver metastases with 2 new hypermetabolic lesions.  New multifocal radiotracer activity in the marrow spaces.   -------------------------------------------------------------------------------------------------------------------- Current treatment: Trodelvy cycle 1 CA 27-29: 714.8 on 11/22/2023 (was 400 on 10/11/2023) Guardant360 has been ordered and results are pending. Return to clinic in 1 week for toxicity check

## 2023-11-29 NOTE — Progress Notes (Signed)
 CRITICAL VALUE STICKER  CRITICAL VALUE: AST 171L Bili 3.7  RECEIVER (on-site recipient of call): Nelly Rout, LPN  DATE & TIME NOTIFIED: 11/29/23 1038  MESSENGER (representative from lab): Heather  MD NOTIFIED: Dr Pamelia Hoit  TIME OF NOTIFICATION:1055   RESPONSE:  OK to treat   Received message from Elease Etienne, RN asking for OK to treat d/t platelets 40, pulse 108, and AST, bili results. Per MD advised OK to treat.

## 2023-11-29 NOTE — Progress Notes (Signed)
 Patient Care Team: Carylon Perches, MD as PCP - General (Internal Medicine) Almond Lint, MD as Consulting Physician (General Surgery) Lonie Peak, MD as Attending Physician (Radiation Oncology) Patton Salles, MD as Consulting Physician (Obstetrics and Gynecology) Venancio Poisson, MD as Consulting Physician (Dermatology) Crist Fat, MD as Consulting Physician (Urology) Charna Elizabeth, MD as Consulting Physician (Gastroenterology) Sheral Apley, MD as Attending Physician (Orthopedic Surgery) Serena Croissant, MD as Consulting Physician (Hematology and Oncology)  DIAGNOSIS:  Encounter Diagnosis  Name Primary?   Malignant neoplasm of lower-inner quadrant of right breast of female, estrogen receptor positive (HCC) Yes    SUMMARY OF ONCOLOGIC HISTORY: Oncology History  Malignant neoplasm of lower-inner quadrant of right breast of female, estrogen receptor positive (HCC)  07/03/2019 Initial Diagnosis   right lower inner quadrant biopsy, for a clinicaly multiple T1c N0, stage IA invasive ductal carcinoma, grade 2, E-cadherin positive, strongly estrogen receptor positive but progesterone receptor negative and HER-2 not amplified, with an MIB-1 of 5%             (a) breast MRI 08/07/2019 showed 2 additional suspicious areas in the right breast and 1 in the left breast              (b) biopsy of all 3 areas 08/21/2019 showed no malignancy   07/26/2019 -  Anti-estrogen oral therapy   tamoxifen started neoadjuvantly 07/26/2019; to be continued for 10 years.  S/p TAH/BSO on 01/16/2020 with benign pathology   08/04/2019 Genetic Testing   Negative genetic testing:  No pathogenic variants detected on the [facility] Breast Cancer STAT panel or the Common Hereditary Cancers panel. The report date is 08/04/2019.  The STAT Breast cancer panel offered by [facility] includes sequencing and rearrangement analysis for the following 9 genes:  ATM, BRCA1, BRCA2, CDH1, CHEK2, PALB2, PTEN, STK11 and  TP53.  The Common Hereditary Cancers Panel offered by [facility] includes sequencing and/or deletion duplication testing of the following 48 genes: APC, ATM, AXIN2, BARD1, BMPR1A, BRCA1, BRCA2, BRIP1, CDH1, CDK4, CDKN2A (p14ARF), CDKN2A (p16INK4a), CHEK2, CTNNA1, DICER1, EPCAM (Deletion/duplication testing only), GREM1 (promoter region deletion/duplication testing only), KIT, MEN1, MLH1, MSH2, MSH3, MSH6, MUTYH, NBN, NF1, NHTL1, PALB2, PDGFRA, PMS2, POLD1, POLE, PTEN, RAD50, RAD51C, RAD51D, RNF43, SDHB, SDHC, SDHD, SMAD4, SMARCA4. STK11, TP53, TSC1, TSC2, and VHL.  The following genes were evaluated for sequence changes only: SDHA and HOXB13 c.251G>A variant only.    10/04/2019 Cancer Staging   Staging form: Breast, AJCC 8th Edition - Pathologic stage from 10/04/2019: Stage IIB (pT2, pN1a(sn), cM0, G2, ER+, PR-, HER2-) - Signed by Loa Socks, NP on 10/18/2019   10/04/2019 Surgery    right lumpectomy and sentinel lymph node sampling 10/04/2019 showed a pT2 pN1, stage IIB invasive ductal carcinoma, grade 2, with positive lymphovascular invasion and a positive superior margin             (a) 3 sentinel lymph nodes removed, one with macro metastatic deposit, 1 with a micrometastatic deposit             (b) additional surgery for margin clearance   10/04/2019 Miscellaneous    MammaPrint on the 10/04/2019 sample shows a low risk luminal A tumor predicting a 5-year metastasis free survival of 96% without chemotherapy, and a chemotherapy benefit of less than 1.5%    11/27/2019 - 01/05/2020 Radiation Therapy    Site Technique Total Dose (Gy) Dose per Fx (Gy) Completed Fx Beam Energies  Breast, Right: Breast_Rt 3D 50/50 2 25/25  6X, 10X  Breast, Right: Breast_Rt_SCV_PAB 3D 50/50 2 25/25 6X, 10X  Breast, Right: Breast_Rt_Bst 3D 10/10 2 5/5 6X, 10X     09/29/2022 Imaging   IMPRESSION: 1. Surgical changes involving the right medial breast. No obvious recurrent breast mass or chest wall mass. 2. Mediastinal  and hilar lymphadenopathy suggesting metastatic disease. 3. Patchy tree-in-bud type nodularity in the lungs suggesting chronic inflammation or atypical infection such as MAC. No definite pulmonary metastatic nodules. 4. Large partially necrotic mass involving most of the right hepatic lobe and a 2.5 cm lesion in segment 3 of the liver consistent with metastatic disease. 5. 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   10/06/2022 Initial Biopsy   Liver biopsy: + for malignancy, metastatic poorly differentiated carcinoma, likely breast origin, biomarkers pending, insufficient tissue for molecular testing. (Resulted in EPIC on 10/13/2022)   10/07/2022 PET scan   PET scan on 10/07/2022 that showed hypermetabolic metastatic breast cancer with hypermetabolic mediastinal and hilar adenopathy, hypermetabolic hepatic metastatic disease and bone disease.  There were no findings for pulmonary metastatic disease.   10/21/2022 -  Anti-estrogen oral therapy   Verzinio and Letrozole   01/15/2023 - 11/01/2023 Chemotherapy   Patient is on Treatment Plan : BREAST METASTATIC Fam-Trastuzumab Deruxtecan-nxki (Enhertu) (5.4) q21d     02/19/2023 Cancer Staging   Staging form: Breast, AJCC 8th Edition - Pathologic: Stage IV (pM1) - Signed by Loa Socks, NP on 02/19/2023   11/29/2023 -  Chemotherapy   Patient is on Treatment Plan : BREAST Sacituzumab govitecan-hziy Drinda Butts) D1,8 q21d     Cancer, metastatic to bone (HCC)  12/21/2022 Initial Diagnosis   Cancer, metastatic to bone (HCC)   01/15/2023 - 11/01/2023 Chemotherapy   Patient is on Treatment Plan : BREAST METASTATIC Fam-Trastuzumab Deruxtecan-nxki (Enhertu) (5.4) q21d     11/29/2023 -  Chemotherapy   Patient is on Treatment Plan : BREAST Sacituzumab govitecan-hziy Drinda Butts) D1,8 q21d       CHIEF COMPLIANT: Cycle 1 Trodelvy  HISTORY OF PRESENT ILLNESS:   History of Present Illness The patient, with a  history of cancer, presents with pain in her left thigh, which she describes as feeling like her IT band. The pain started after she went walking one day and has persisted. The patient also rode for a while on another day. The pain is severe enough that she has been taking pain medication, which she reports knocks her out. The patient also mentions having spasms. The patient has a history of cancer and recently had a PET scan and a genetic test.     ALLERGIES:  is allergic to metronidazole, other, and tape.  MEDICATIONS:  Current Outpatient Medications  Medication Sig Dispense Refill   benzonatate (TESSALON) 200 MG capsule Take 1 capsule (200 mg total) by mouth 3 (three) times daily as needed for cough. 20 capsule 0   dexamethasone (DECADRON) 4 MG tablet Take 1 tablets daily for 2 days. Start the day after chemotherapy. 30 tablet 1   docusate sodium (COLACE) 100 MG capsule Take 1 capsule (100 mg total) by mouth daily. (Patient not taking: Reported on 11/01/2023) 30 capsule 6   furosemide (LASIX) 20 MG tablet TAKE 1 TABLET BY MOUTH EVERY DAY WITH BREAKFAST 90 tablet 1   lidocaine-prilocaine (EMLA) cream Apply to affected area once 30 g 3   loperamide (IMODIUM) 2 MG capsule Take 2 tabs by mouth with first loose stool, then 1 tab with each additional loose stool as needed.  Do not exceed 8 tabs in a 24-hour period 60 capsule 3   LORazepam (ATIVAN) 0.5 MG tablet Take 1 tablet (0.5 mg total) by mouth every 8 (eight) hours as needed. for anxiety 30 tablet 3   omeprazole (PRILOSEC) 20 MG capsule TAKE 1 CAPSULE BY MOUTH 2 (TWO) TIMES DAILY BEFORE A MEAL. (Patient not taking: Reported on 11/01/2023) 180 capsule 3   ondansetron (ZOFRAN) 8 MG tablet Take 1 tablet (8 mg total) by mouth every 8 (eight) hours as needed for nausea or vomiting. (Patient not taking: Reported on 11/01/2023) 30 tablet 2   spironolactone (ALDACTONE) 25 MG tablet TAKE 1 TABLET (25 MG TOTAL) BY MOUTH DAILY. 90 tablet 1   valsartan (DIOVAN) 40  MG tablet Take 40 mg by mouth at bedtime.     venlafaxine XR (EFFEXOR-XR) 37.5 MG 24 hr capsule Take 1 capsule (37.5 mg total) by mouth daily. 90 capsule 4   No current facility-administered medications for this visit.    PHYSICAL EXAMINATION: ECOG PERFORMANCE STATUS: 1 - Symptomatic but completely ambulatory  Vitals:   11/29/23 0940  BP: 127/67  Pulse: (!) 120  Resp: 19  Temp: (!) 97.3 F (36.3 C)  SpO2: 97%   Filed Weights   11/29/23 0940  Weight: 151 lb 1.6 oz (68.5 kg)    Physical Exam   (exam performed in the presence of a chaperone)  LABORATORY DATA:  I have reviewed the data as listed    Latest Ref Rng & Units 11/22/2023    8:27 AM 11/01/2023   11:36 AM 10/11/2023    1:24 PM  CMP  Glucose 70 - 99 mg/dL 657  846  962   BUN 8 - 23 mg/dL 10  9  8    Creatinine 0.44 - 1.00 mg/dL 9.52  8.41  3.24   Sodium 135 - 145 mmol/L 134  136  137   Potassium 3.5 - 5.1 mmol/L 3.9  4.3  4.2   Chloride 98 - 111 mmol/L 103  104  107   CO2 22 - 32 mmol/L 22  26  25    Calcium 8.9 - 10.3 mg/dL 9.0  9.3  9.1   Total Protein 6.5 - 8.1 g/dL 6.1  6.2  6.1   Total Bilirubin 0.0 - 1.2 mg/dL 2.5  1.6  1.5   Alkaline Phos 38 - 126 U/L 191  171  152   AST 15 - 41 U/L 125  81  62   ALT 0 - 44 U/L 33  28  22     Lab Results  Component Value Date   WBC 7.2 11/29/2023   HGB 13.3 11/29/2023   HCT 39.0 11/29/2023   MCV 102.6 (H) 11/29/2023   PLT 40 (L) 11/29/2023   NEUTROABS 4.5 11/29/2023    ASSESSMENT & PLAN:  Malignant neoplasm of lower-inner quadrant of right breast of female, estrogen receptor positive (HCC) 07/03/2019: Right breast LIQ T1CN0 stage Ia grade 2 IDC ER 95% PR negative HER2 negative Ki-67 5% 10/04/2019: Right lumpectomy: T2N1 stage IIb grade 2 IDC positive lymphovascular invasion 3 lymph nodes removed 1 with macro N1 micrometastatic disease 10/04/2019: MammaPrint: Low risk 01/05/2020: Completed adjuvant radiation 08/04/2019: Genetics: Negative November 2020:  Tamoxifen 01/15/2023-11/29/2023: Enhertu ------------------------------------------------------------------- 09/28/2022: Patient presented with platelets 65 INR 1.9 elevated LFTs and hypercalcemia  CT CAP 09/29/2022: Mediastinal and hilar lymphadenopathy suggestive of metastatic disease.  Patchy tree-in-bud nodularity in the lungs, partially necrotic mass right hepatic lobe 12 cm, also 2.5 cm lesion lytic bone  metastases in spine pelvis and left scapula   Treatment plan: Liver biopsy: Poorly diff cancer ER 30-40%, PR:0%, Her 2 Neg Verzinio with AI started 10/24/2022 discontinued May 2024 due to progression in the liver 3.  Bone Marrow Biopsy: 10/13/22: Positive for met breast cancer:   4.  Hospitalization 01/11/2023-01/21/2023: Severe jaundice due to progression in the liver 5.  Perative radiation to the spine 6. PET CT scan: 11/19/2023: liver metastases with 2 new hypermetabolic lesions.  New multifocal radiotracer activity in the marrow spaces.   -------------------------------------------------------------------------------------------------------------------- Current treatment: Trodelvy cycle 1 CA 27-29: 714.8 on 11/22/2023 (was 400 on 10/11/2023) Guardant360 11/24/2023: T p53 mutations, ARID1A, GATA3 mutations present (no therapeutic targets), MSI high not detected Return to clinic in 1 week for toxicity check I will see if patient can be eligible for a clinical trial at [facility] based on the T p53 mutation. ------------------------------------- Assessment and Plan Assessment & Plan Breast cancer with distant metastases Breast cancer with distant metastases, evidenced by bone marrow involvement and multiple mutations in cancer cells. Recent PET scan shows bone marrow changes in the left thigh area without fracture risk. Cancer marker increased from 400 to 700, indicating progression. Current treatment aims to target cancer cells and alleviate symptoms. Clinical trials at [facility] are considered due  to mutations, although immunotherapy is unsuitable as MSI high was not detected. Treatment plan includes chemotherapy on day 1 and day 8 of a 3-week cycle, with a 3/4 dose today. Anticipated side effects include fatigue, nausea, and GI issues. Clinical trials are phase two, assessing efficacy. Meeting with [facility] suggested for future options. - Administer chemotherapy on day 1 and day 8 of a 3-week cycle, with a 3/4 dose today. - Monitor blood counts, especially platelets, due to bone marrow involvement. - Monitor cancer markers to assess treatment efficacy. - Consider clinical trial options at [facility] for future treatment if eligible. - Email colleague to check eligibility for clinical trials. - Prescribe ondansetron for nausea management.  Thrombocytopenia Thrombocytopenia with platelet counts around 40, likely due to bone marrow involvement from metastatic breast cancer. No immediate bleeding risk, but caution advised to avoid activities that may cause bruising or bleeding. Platelet transfusion not indicated unless active bleeding occurs. - Monitor platelet counts regularly. - Advise caution to avoid activities that may cause bruising or bleeding.  Musculoskeletal pain Pain in the left thigh area, possibly related to the IT band or muscle spasms. PET scan shows bone marrow changes but no fracture risk. Pain managed with medication, but muscle relaxers considered for potential muscle spasms. - Prescribe muscle relaxers to address potential muscle spasms.      No orders of the defined types were placed in this encounter.  The patient has a good understanding of the overall plan. she agrees with it. she will call with any problems that may develop before the next visit here. Total time spent: 30 mins including face to face time and time spent for planning, charting and co-ordination of care   Tamsen Meek, MD 11/29/23

## 2023-11-29 NOTE — Patient Instructions (Signed)
 [facility] - A DEPT OF [facility]  Discharge Instructions: Thank you for choosing [facility] to provide your oncology and hematology care.   If you have a lab appointment with the Cancer Center, please go directly to the Cancer Center and check in at the registration area.   Wear comfortable clothing and clothing appropriate for easy access to any Portacath or PICC line.   We strive to give you quality time with your provider. You may need to reschedule your appointment if you arrive late (15 or more minutes).  Arriving late affects you and other patients whose appointments are after yours.  Also, if you miss three or more appointments without notifying the office, you may be dismissed from the clinic at the provider's discretion.      For prescription refill requests, have your pharmacy contact our office and allow 72 hours for refills to be completed.    Today you received the following chemotherapy and/or immunotherapy agents Sacituzumab      To help prevent nausea and vomiting after your treatment, we encourage you to take your nausea medication as directed.  BELOW ARE SYMPTOMS THAT SHOULD BE REPORTED IMMEDIATELY: *FEVER GREATER THAN 100.4 F (38 C) OR HIGHER *CHILLS OR SWEATING *NAUSEA AND VOMITING THAT IS NOT CONTROLLED WITH YOUR NAUSEA MEDICATION *UNUSUAL SHORTNESS OF BREATH *UNUSUAL BRUISING OR BLEEDING *URINARY PROBLEMS (pain or burning when urinating, or frequent urination) *BOWEL PROBLEMS (unusual diarrhea, constipation, pain near the anus) TENDERNESS IN MOUTH AND THROAT WITH OR WITHOUT PRESENCE OF ULCERS (sore throat, sores in mouth, or a toothache) UNUSUAL RASH, SWELLING OR PAIN  UNUSUAL VAGINAL DISCHARGE OR ITCHING   Items with * indicate a potential emergency and should be followed up as soon as possible or go to the Emergency Department if any problems should occur.  Please show the CHEMOTHERAPY ALERT CARD or IMMUNOTHERAPY  ALERT CARD at check-in to the Emergency Department and triage nurse.  Should you have questions after your visit or need to cancel or reschedule your appointment, please contact [facility] - A DEPT OF [facility]  Dept: [phone]  and follow the prompts.  Office hours are 8:00 a.m. to 4:30 p.m. Monday - Friday. Please note that voicemails left after 4:00 p.m. may not be returned until the following business day.  We are closed weekends and major holidays. You have access to a nurse at all times for urgent questions. Please call the main number to the clinic Dept: [phone] and follow the prompts.   For any non-urgent questions, you may also contact your provider using MyChart. We now offer e-Visits for anyone 84 and older to request care online for non-urgent symptoms. For details visit [email or web address].   Also download the MyChart app! Go to the app store, search "MyChart", open the app, select [place], and log in with your MyChart username and password.  Sacituzumab Govitecan Injection What is this medication? SACITUZUMAB GOVETECAN (SAK i TOOZ ue mab GOE vi TEE can) treats breast cancer. It works by blocking a protein that causes cancer cells to grow and multiply. This helps to slow or stop the spread of cancer cells. This medicine may be used for other purposes; ask your health care provider or pharmacist if you have questions. COMMON BRAND NAME(S): TRODELVY What should I tell my care team before I take this medication? They need to know if you have any of these conditions:  Carry the UGT1A1*28 gene Infection Liver disease An unusual or allergic reaction to sacituzumab govitecan, other medications, foods, dyes, or preservatives Pregnant or trying to get pregnant Breast-feeding How should I use this medication? This medication is injected into a vein. It is given by your care team in a hospital or clinic setting. Talk to your care team about  the use of this medication in children. Special care may be needed. Overdosage: If you think you have taken too much of this medicine contact a poison control center or emergency room at once. NOTE: This medicine is only for you. Do not share this medicine with others. What if I miss a dose? Keep appointments for follow-up doses. It is important not to miss your dose. Call your care team if you are unable to keep an appointment. What may interact with this medication? This medication may affect how other medications work, and other medications may affect the way this medication works. Talk with your care team about all of the medications you take. They may suggest changes to your treatment plan to lower the risk of side effects and to make sure your medications work as intended. This list may not describe all possible interactions. Give your health care provider a list of all the medicines, herbs, non-prescription drugs, or dietary supplements you use. Also tell them if you smoke, drink alcohol, or use illegal drugs. Some items may interact with your medicine. What should I watch for while using this medication? This medication may make you feel generally unwell. This is not uncommon as chemotherapy can affect healthy cells as well as cancer cells. Report any side effects. Continue your course of treatment even though you feel ill unless your care team tells you to stop. You may need blood work while you are taking this medication. Certain genetic factors may decrease the safety of this medication. Your care team may use genetic tests to determine treatment. This medication can cause serious allergic reactions. To reduce your risk, your care team may give you other medications to take before receiving this one. Be sure to follow the directions from your care team. Check with your care team if you have severe diarrhea, nausea, and vomiting, or if you sweat a lot. The loss of too much body fluid may make  it dangerous for you to take this medication. Talk to your care team if you wish to become pregnant or think you might be pregnant. This medication can cause serious birth defects if taken during pregnancy or if you get pregnant within 6 months after stopping treatment. A negative pregnancy test is required before starting this medication. A reliable form of contraception is recommended while taking this medication and for 6 months after stopping treatment. Talk to your care team about reliable forms of contraception. Use a condom during sex and for 3 months after stopping treatment. Tell your care team right away if you think your partner might be pregnant. This medication can cause serious birth defects. Do not breast-feed while taking this medication and for 1 month after stopping therapy. This medication may cause infertility. Talk to your care team if you are concerned about your fertility. This medication may increase your risk of getting an infection. Call your care team for advice if you get a fever, chills, sore throat, or other symptoms of a cold or flu. Do not treat yourself. Try to avoid being around people who are sick. Avoid taking medications that contain aspirin, acetaminophen, ibuprofen, naproxen, or ketoprofen  unless instructed by your care team. These medications may hide a fever. This medication may increase blood sugar. The risk may be higher in patients who already have diabetes. Ask your care team what you can do to lower your risk of diabetes while taking this medication. What side effects may I notice from receiving this medication? Side effects that you should report to your care team as soon as possible: Allergic reactions--skin rash, itching, hives, swelling of the face, lips, tongue, or throat Infection--fever, chills, cough, or sore throat Infusion reactions--chest pain, shortness of breath or trouble breathing, feeling faint or lightheaded Low red blood cell level--unusual  weakness or fatigue, dizziness, headache, trouble breathing Severe or prolonged diarrhea Side effects that usually do not require medical attention (report these to your care team if they continue or are bothersome): Constipation Diarrhea Fatigue Hair loss Loss of appetite Nausea Vomiting This list may not describe all possible side effects. Call your doctor for medical advice about side effects. You may report side effects to FDA at 1-800-FDA-1088. Where should I keep my medication? This medication is given in a hospital or clinic. It will not be stored at home. NOTE: This sheet is a summary. It may not cover all possible information. If you have questions about this medicine, talk to your doctor, pharmacist, or health care provider.  2024 Elsevier/Gold Standard (2023-07-30 00:00:00)

## 2023-12-01 ENCOUNTER — Encounter (HOSPITAL_COMMUNITY): Payer: Self-pay | Admitting: Hematology and Oncology

## 2023-12-01 ENCOUNTER — Other Ambulatory Visit: Payer: Self-pay

## 2023-12-02 ENCOUNTER — Other Ambulatory Visit: Payer: Self-pay | Admitting: *Deleted

## 2023-12-03 ENCOUNTER — Encounter: Payer: Self-pay | Admitting: Hematology and Oncology

## 2023-12-03 MED FILL — Fosaprepitant Dimeglumine For IV Infusion 150 MG (Base Eq): INTRAVENOUS | Qty: 5 | Status: AC

## 2023-12-06 ENCOUNTER — Inpatient Hospital Stay: Attending: Hematology and Oncology | Admitting: Hematology and Oncology

## 2023-12-06 ENCOUNTER — Inpatient Hospital Stay

## 2023-12-06 ENCOUNTER — Other Ambulatory Visit: Payer: Self-pay

## 2023-12-06 VITALS — BP 125/63 | HR 117 | Temp 98.0°F | Resp 18 | Ht 65.0 in | Wt 149.8 lb

## 2023-12-06 DIAGNOSIS — C787 Secondary malignant neoplasm of liver and intrahepatic bile duct: Secondary | ICD-10-CM | POA: Insufficient documentation

## 2023-12-06 DIAGNOSIS — C7951 Secondary malignant neoplasm of bone: Secondary | ICD-10-CM | POA: Diagnosis not present

## 2023-12-06 DIAGNOSIS — E860 Dehydration: Secondary | ICD-10-CM | POA: Diagnosis not present

## 2023-12-06 DIAGNOSIS — Z9221 Personal history of antineoplastic chemotherapy: Secondary | ICD-10-CM | POA: Insufficient documentation

## 2023-12-06 DIAGNOSIS — Z87891 Personal history of nicotine dependence: Secondary | ICD-10-CM | POA: Diagnosis not present

## 2023-12-06 DIAGNOSIS — C50311 Malignant neoplasm of lower-inner quadrant of right female breast: Secondary | ICD-10-CM

## 2023-12-06 DIAGNOSIS — C419 Malignant neoplasm of bone and articular cartilage, unspecified: Secondary | ICD-10-CM

## 2023-12-06 DIAGNOSIS — Z1722 Progesterone receptor negative status: Secondary | ICD-10-CM | POA: Diagnosis not present

## 2023-12-06 DIAGNOSIS — D701 Agranulocytosis secondary to cancer chemotherapy: Secondary | ICD-10-CM | POA: Diagnosis not present

## 2023-12-06 DIAGNOSIS — Z923 Personal history of irradiation: Secondary | ICD-10-CM | POA: Insufficient documentation

## 2023-12-06 DIAGNOSIS — T451X5A Adverse effect of antineoplastic and immunosuppressive drugs, initial encounter: Secondary | ICD-10-CM | POA: Diagnosis not present

## 2023-12-06 DIAGNOSIS — Z170 Estrogen receptor positive status [ER+]: Secondary | ICD-10-CM | POA: Insufficient documentation

## 2023-12-06 DIAGNOSIS — R000 Tachycardia, unspecified: Secondary | ICD-10-CM | POA: Insufficient documentation

## 2023-12-06 DIAGNOSIS — M25551 Pain in right hip: Secondary | ICD-10-CM | POA: Diagnosis not present

## 2023-12-06 DIAGNOSIS — Z1732 Human epidermal growth factor receptor 2 negative status: Secondary | ICD-10-CM | POA: Insufficient documentation

## 2023-12-06 DIAGNOSIS — Z90710 Acquired absence of both cervix and uterus: Secondary | ICD-10-CM | POA: Diagnosis not present

## 2023-12-06 DIAGNOSIS — R180 Malignant ascites: Secondary | ICD-10-CM

## 2023-12-06 LAB — CBC WITH DIFFERENTIAL (CANCER CENTER ONLY)
Abs Immature Granulocytes: 0.03 10*3/uL (ref 0.00–0.07)
Basophils Absolute: 0 10*3/uL (ref 0.0–0.1)
Basophils Relative: 1 %
Eosinophils Absolute: 0 10*3/uL (ref 0.0–0.5)
Eosinophils Relative: 3 %
HCT: 37 % (ref 36.0–46.0)
Hemoglobin: 12.8 g/dL (ref 12.0–15.0)
Immature Granulocytes: 2 %
Lymphocytes Relative: 57 %
Lymphs Abs: 0.9 10*3/uL (ref 0.7–4.0)
MCH: 34.2 pg — ABNORMAL HIGH (ref 26.0–34.0)
MCHC: 34.6 g/dL (ref 30.0–36.0)
MCV: 98.9 fL (ref 80.0–100.0)
Monocytes Absolute: 0.2 10*3/uL (ref 0.1–1.0)
Monocytes Relative: 11 %
Neutro Abs: 0.4 10*3/uL — CL (ref 1.7–7.7)
Neutrophils Relative %: 26 %
Platelet Count: 27 10*3/uL — ABNORMAL LOW (ref 150–400)
RBC: 3.74 MIL/uL — ABNORMAL LOW (ref 3.87–5.11)
RDW: 19.3 % — ABNORMAL HIGH (ref 11.5–15.5)
WBC Count: 1.4 10*3/uL — ABNORMAL LOW (ref 4.0–10.5)
nRBC: 0 % (ref 0.0–0.2)

## 2023-12-06 LAB — CMP (CANCER CENTER ONLY)
ALT: 60 U/L — ABNORMAL HIGH (ref 0–44)
AST: 120 U/L — ABNORMAL HIGH (ref 15–41)
Albumin: 3.7 g/dL (ref 3.5–5.0)
Alkaline Phosphatase: 258 U/L — ABNORMAL HIGH (ref 38–126)
Anion gap: 8 (ref 5–15)
BUN: 13 mg/dL (ref 8–23)
CO2: 26 mmol/L (ref 22–32)
Calcium: 9.2 mg/dL (ref 8.9–10.3)
Chloride: 101 mmol/L (ref 98–111)
Creatinine: 0.82 mg/dL (ref 0.44–1.00)
GFR, Estimated: >60 mL/min (ref >60)
Glucose, Bld: 154 mg/dL — ABNORMAL HIGH (ref 70–99)
Potassium: 4.1 mmol/L (ref 3.5–5.1)
Sodium: 135 mmol/L (ref 135–145)
Total Bilirubin: 4 mg/dL (ref 0.0–1.2)
Total Protein: 6.1 g/dL — ABNORMAL LOW (ref 6.5–8.1)

## 2023-12-06 LAB — MAGNESIUM: Magnesium: 2 mg/dL (ref 1.7–2.4)

## 2023-12-06 LAB — PHOSPHORUS: Phosphorus: 3.9 mg/dL (ref 2.5–4.6)

## 2023-12-06 MED ORDER — SODIUM CHLORIDE 0.9% FLUSH
10.0000 mL | Freq: Once | INTRAVENOUS | Status: AC
Start: 2023-12-06 — End: 2023-12-06
  Administered 2023-12-06: 10 mL

## 2023-12-06 MED ORDER — HEPARIN SOD (PORK) LOCK FLUSH 100 UNIT/ML IV SOLN
500.0000 [IU] | Freq: Once | INTRAVENOUS | Status: AC
Start: 2023-12-06 — End: 2023-12-06
  Administered 2023-12-06: 500 [IU]

## 2023-12-06 MED ORDER — FILGRASTIM-SNDZ 480 MCG/0.8ML IJ SOSY
480.0000 ug | PREFILLED_SYRINGE | Freq: Once | INTRAMUSCULAR | Status: AC
  Administered 2023-12-06: 480 ug via SUBCUTANEOUS
  Filled 2023-12-06: qty 0.8

## 2023-12-06 MED ORDER — SODIUM CHLORIDE 0.9% FLUSH
10.0000 mL | Freq: Once | INTRAVENOUS | Status: AC
  Administered 2023-12-06: 10 mL

## 2023-12-06 NOTE — Progress Notes (Signed)
 Referral and all supporting documents faxed to [facility] for clinical trial consult per MD.

## 2023-12-06 NOTE — Progress Notes (Signed)
 Education given to pt on neutropenic precautions and education on injection she will receive today (Zarxio). She was advised to call us if she develops fever which lasts more than 24 hr. Education printed via [email or web address].

## 2023-12-06 NOTE — Assessment & Plan Note (Addendum)
 07/03/2019: Right breast LIQ T1CN0 stage Ia grade 2 IDC ER 95% PR negative HER2 negative Ki-67 5% 10/04/2019: Right lumpectomy: T2N1 stage IIb grade 2 IDC positive lymphovascular invasion 3 lymph nodes removed 1 with macro N1 micrometastatic disease 10/04/2019: MammaPrint: Low risk 01/05/2020: Completed adjuvant radiation 08/04/2019: Genetics: Negative November 2020: Tamoxifen 01/15/2023-11/29/2023: Enhertu ------------------------------------------------------------------- 09/28/2022: Patient presented with platelets 65 INR 1.9 elevated LFTs and hypercalcemia  CT CAP 09/29/2022: Mediastinal and hilar lymphadenopathy suggestive of metastatic disease.  Patchy tree-in-bud nodularity in the lungs, partially necrotic mass right hepatic lobe 12 cm, also 2.5 cm lesion lytic bone metastases in spine pelvis and left scapula   Treatment plan: Liver biopsy: Poorly diff cancer ER 30-40%, PR:0%, Her 2 Neg Verzinio with AI started 10/24/2022 discontinued May 2024 due to progression in the liver 3.  Bone Marrow Biopsy: 10/13/22: Positive for met breast cancer:   4.  Hospitalization 01/11/2023-01/21/2023: Severe jaundice due to progression in the liver 5.  Perative radiation to the spine 6. PET CT scan: 11/19/2023: liver metastases with 2 new hypermetabolic lesions.  New multifocal radiotracer activity in the marrow spaces.   -------------------------------------------------------------------------------------------------------------------- Current treatment: Trodelvy cycle 1 day 8 CA 27-29: 714.8 on 11/22/2023 (was 400 on 10/11/2023) Guardant360 11/24/2023: T p53 mutations, ARID1A, GATA3 mutations present (no therapeutic targets), MSI high not detected PD-L1: CPS < 1  Chemo toxicities:  I will see if patient can be eligible for a clinical trial at [facility] based on the T p53 mutation.

## 2023-12-06 NOTE — Progress Notes (Signed)
 Patient Care Team: Carylon Perches, MD as PCP - General (Internal Medicine) Almond Lint, MD as Consulting Physician (General Surgery) Lonie Peak, MD as Attending Physician (Radiation Oncology) Patton Salles, MD as Consulting Physician (Obstetrics and Gynecology) Venancio Poisson, MD as Consulting Physician (Dermatology) Crist Fat, MD as Consulting Physician (Urology) Charna Elizabeth, MD as Consulting Physician (Gastroenterology) Sheral Apley, MD as Attending Physician (Orthopedic Surgery) Serena Croissant, MD as Consulting Physician (Hematology and Oncology)  DIAGNOSIS:  Encounter Diagnoses  Name Primary?   Malignant neoplasm of lower-inner quadrant of right breast of female, estrogen receptor positive (HCC) Yes   Cancer, metastatic to bone (HCC)     SUMMARY OF ONCOLOGIC HISTORY: Oncology History  Malignant neoplasm of lower-inner quadrant of right breast of female, estrogen receptor positive (HCC)  07/03/2019 Initial Diagnosis   right lower inner quadrant biopsy, for a clinicaly multiple T1c N0, stage IA invasive ductal carcinoma, grade 2, E-cadherin positive, strongly estrogen receptor positive but progesterone receptor negative and HER-2 not amplified, with an MIB-1 of 5%             (a) breast MRI 08/07/2019 showed 2 additional suspicious areas in the right breast and 1 in the left breast              (b) biopsy of all 3 areas 08/21/2019 showed no malignancy   07/26/2019 -  Anti-estrogen oral therapy   tamoxifen started neoadjuvantly 07/26/2019; to be continued for 10 years.  S/p TAH/BSO on 01/16/2020 with benign pathology   08/04/2019 Genetic Testing   Negative genetic testing:  No pathogenic variants detected on the [facility] Breast Cancer STAT panel or the Common Hereditary Cancers panel. The report date is 08/04/2019.  The STAT Breast cancer panel offered by [facility] includes sequencing and rearrangement analysis for the following 9 genes:  ATM, BRCA1, BRCA2,  CDH1, CHEK2, PALB2, PTEN, STK11 and TP53.  The Common Hereditary Cancers Panel offered by [facility] includes sequencing and/or deletion duplication testing of the following 48 genes: APC, ATM, AXIN2, BARD1, BMPR1A, BRCA1, BRCA2, BRIP1, CDH1, CDK4, CDKN2A (p14ARF), CDKN2A (p16INK4a), CHEK2, CTNNA1, DICER1, EPCAM (Deletion/duplication testing only), GREM1 (promoter region deletion/duplication testing only), KIT, MEN1, MLH1, MSH2, MSH3, MSH6, MUTYH, NBN, NF1, NHTL1, PALB2, PDGFRA, PMS2, POLD1, POLE, PTEN, RAD50, RAD51C, RAD51D, RNF43, SDHB, SDHC, SDHD, SMAD4, SMARCA4. STK11, TP53, TSC1, TSC2, and VHL.  The following genes were evaluated for sequence changes only: SDHA and HOXB13 c.251G>A variant only.    10/04/2019 Cancer Staging   Staging form: Breast, AJCC 8th Edition - Pathologic stage from 10/04/2019: Stage IIB (pT2, pN1a(sn), cM0, G2, ER+, PR-, HER2-) - Signed by Loa Socks, NP on 10/18/2019   10/04/2019 Surgery    right lumpectomy and sentinel lymph node sampling 10/04/2019 showed a pT2 pN1, stage IIB invasive ductal carcinoma, grade 2, with positive lymphovascular invasion and a positive superior margin             (a) 3 sentinel lymph nodes removed, one with macro metastatic deposit, 1 with a micrometastatic deposit             (b) additional surgery for margin clearance   10/04/2019 Miscellaneous    MammaPrint on the 10/04/2019 sample shows a low risk luminal A tumor predicting a 5-year metastasis free survival of 96% without chemotherapy, and a chemotherapy benefit of less than 1.5%    11/27/2019 - 01/05/2020 Radiation Therapy    Site Technique Total Dose (Gy) Dose per Fx (Gy) Completed Fx Beam Energies  Breast, Right: Breast_Rt 3D 50/50 2 25/25 6X, 10X  Breast, Right: Breast_Rt_SCV_PAB 3D 50/50 2 25/25 6X, 10X  Breast, Right: Breast_Rt_Bst 3D 10/10 2 5/5 6X, 10X     09/29/2022 Imaging   IMPRESSION: 1. Surgical changes involving the right medial breast. No obvious recurrent breast mass  or chest wall mass. 2. Mediastinal and hilar lymphadenopathy suggesting metastatic disease. 3. Patchy tree-in-bud type nodularity in the lungs suggesting chronic inflammation or atypical infection such as MAC. No definite pulmonary metastatic nodules. 4. Large partially necrotic mass involving most of the right hepatic lobe and a 2.5 cm lesion in segment 3 of the liver consistent with metastatic disease. 5. 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   10/06/2022 Initial Biopsy   Liver biopsy: + for malignancy, metastatic poorly differentiated carcinoma, likely breast origin, biomarkers pending, insufficient tissue for molecular testing. (Resulted in EPIC on 10/13/2022)   10/07/2022 PET scan   PET scan on 10/07/2022 that showed hypermetabolic metastatic breast cancer with hypermetabolic mediastinal and hilar adenopathy, hypermetabolic hepatic metastatic disease and bone disease.  There were no findings for pulmonary metastatic disease.   10/21/2022 -  Anti-estrogen oral therapy   Verzinio and Letrozole   01/15/2023 - 11/01/2023 Chemotherapy   Patient is on Treatment Plan : BREAST METASTATIC Fam-Trastuzumab Deruxtecan-nxki (Enhertu) (5.4) q21d     02/19/2023 Cancer Staging   Staging form: Breast, AJCC 8th Edition - Pathologic: Stage IV (pM1) - Signed by Loa Socks, NP on 02/19/2023   11/29/2023 -  Chemotherapy   Patient is on Treatment Plan : BREAST Sacituzumab govitecan-hziy Drinda Butts) D1,8 q21d     Cancer, metastatic to bone (HCC)  12/21/2022 Initial Diagnosis   Cancer, metastatic to bone (HCC)   01/15/2023 - 11/01/2023 Chemotherapy   Patient is on Treatment Plan : BREAST METASTATIC Fam-Trastuzumab Deruxtecan-nxki (Enhertu) (5.4) q21d     11/29/2023 -  Chemotherapy   Patient is on Treatment Plan : BREAST Sacituzumab govitecan-hziy Drinda Butts) D1,8 q21d       CHIEF COMPLIANT: Cycle 1 day 8 Trodelvy  HISTORY OF PRESENT ILLNESS:   History  of Present Illness The patient, with a history of cancer, presents with bloating, constipation, and fatigue following chemotherapy with Drinda Butts. The patient reports feeling "like I'm this big" and has not had a bowel movement since taking medication for runny stools. The patient's stool has turned green, which she was informed is a side effect of the chemotherapy. The patient also reports extreme fatigue. The patient's husband notes that she has good and bad days, with the previous day being a "bad day." The patient's symptoms have been "awful" on her, and she expresses concern about the cancer growing again.     ALLERGIES:  is allergic to metronidazole, other, and tape.  MEDICATIONS:  Current Outpatient Medications  Medication Sig Dispense Refill   dexamethasone (DECADRON) 4 MG tablet Take 1 tablets daily for 2 days. Start the day after chemotherapy. 30 tablet 1   furosemide (LASIX) 20 MG tablet TAKE 1 TABLET BY MOUTH EVERY DAY WITH BREAKFAST 90 tablet 1   lidocaine-prilocaine (EMLA) cream Apply to affected area once 30 g 3   loperamide (IMODIUM) 2 MG capsule Take 2 tabs by mouth with first loose stool, then 1 tab with each additional loose stool as needed. Do not exceed 8 tabs in a 24-hour period 60 capsule 3   LORazepam (ATIVAN) 0.5 MG tablet Take 1 tablet (0.5 mg total) by mouth every 8 (eight) hours as  needed. for anxiety 30 tablet 3   methocarbamol (ROBAXIN-750) 750 MG tablet Take 1 tablet (750 mg total) by mouth every 8 (eight) hours as needed for muscle spasms. 60 tablet 0   spironolactone (ALDACTONE) 25 MG tablet TAKE 1 TABLET (25 MG TOTAL) BY MOUTH DAILY. 90 tablet 1   valsartan (DIOVAN) 40 MG tablet Take 40 mg by mouth at bedtime.     venlafaxine XR (EFFEXOR-XR) 37.5 MG 24 hr capsule Take 1 capsule (37.5 mg total) by mouth daily. 90 capsule 4   docusate sodium (COLACE) 100 MG capsule Take 1 capsule (100 mg total) by mouth daily. (Patient not taking: Reported on 11/01/2023) 30 capsule 6    omeprazole (PRILOSEC) 20 MG capsule TAKE 1 CAPSULE BY MOUTH 2 (TWO) TIMES DAILY BEFORE A MEAL. (Patient not taking: Reported on 12/06/2023) 180 capsule 3   ondansetron (ZOFRAN) 8 MG tablet Take 1 tablet (8 mg total) by mouth every 8 (eight) hours as needed for nausea or vomiting. (Patient not taking: Reported on 12/06/2023) 30 tablet 2   No current facility-administered medications for this visit.    PHYSICAL EXAMINATION: ECOG PERFORMANCE STATUS: 1 - Symptomatic but completely ambulatory  Vitals:   12/06/23 1216  BP: 125/63  Pulse: (!) 117  Resp: 18  Temp: 98 F (36.7 C)  SpO2: 95%   Filed Weights   12/06/23 1216  Weight: 149 lb 12.8 oz (67.9 kg)    Physical Exam  Abdominal bloating and distention  (exam performed in the presence of a chaperone)  LABORATORY DATA:  I have reviewed the data as listed    Latest Ref Rng & Units 11/29/2023    9:17 AM 11/22/2023    8:27 AM 11/01/2023   11:36 AM  CMP  Glucose 70 - 99 mg/dL 161  096  045   BUN 8 - 23 mg/dL 11  10  9    Creatinine 0.44 - 1.00 mg/dL 4.09  8.11  9.14   Sodium 135 - 145 mmol/L 137  134  136   Potassium 3.5 - 5.1 mmol/L 4.1  3.9  4.3   Chloride 98 - 111 mmol/L 102  103  104   CO2 22 - 32 mmol/L 24  22  26    Calcium 8.9 - 10.3 mg/dL 9.5  9.0  9.3   Total Protein 6.5 - 8.1 g/dL 6.2  6.1  6.2   Total Bilirubin 0.0 - 1.2 mg/dL 3.7  2.5  1.6   Alkaline Phos 38 - 126 U/L 219  191  171   AST 15 - 41 U/L 171  125  81   ALT 0 - 44 U/L 32  33  28     Lab Results  Component Value Date   WBC 1.4 (L) 12/06/2023   HGB 12.8 12/06/2023   HCT 37.0 12/06/2023   MCV 98.9 12/06/2023   PLT 27 (L) 12/06/2023   NEUTROABS 0.4 (LL) 12/06/2023    ASSESSMENT & PLAN:  Malignant neoplasm of lower-inner quadrant of right breast of female, estrogen receptor positive (HCC) 07/03/2019: Right breast LIQ T1CN0 stage Ia grade 2 IDC ER 95% PR negative HER2 negative Ki-67 5% 10/04/2019: Right lumpectomy: T2N1 stage IIb grade 2 IDC positive  lymphovascular invasion 3 lymph nodes removed 1 with macro N1 micrometastatic disease 10/04/2019: MammaPrint: Low risk 01/05/2020: Completed adjuvant radiation 08/04/2019: Genetics: Negative November 2020: Tamoxifen 01/15/2023-11/29/2023: Enhertu ------------------------------------------------------------------- 09/28/2022: Patient presented with platelets 65 INR 1.9 elevated LFTs and hypercalcemia  CT CAP 09/29/2022: Mediastinal and hilar lymphadenopathy  suggestive of metastatic disease.  Patchy tree-in-bud nodularity in the lungs, partially necrotic mass right hepatic lobe 12 cm, also 2.5 cm lesion lytic bone metastases in spine pelvis and left scapula   Treatment plan: Liver biopsy: Poorly diff cancer ER 30-40%, PR:0%, Her 2 Neg Verzinio with AI started 10/24/2022 discontinued May 2024 due to progression in the liver 3.  Bone Marrow Biopsy: 10/13/22: Positive for met breast cancer:   4.  Hospitalization 01/11/2023-01/21/2023: Severe jaundice due to progression in the liver 5.  Perative radiation to the spine 6. PET CT scan: 11/19/2023: liver metastases with 2 new hypermetabolic lesions.  New multifocal radiotracer activity in the marrow spaces.   -------------------------------------------------------------------------------------------------------------------- Current treatment: Trodelvy cycle 1 day 8 CA 27-29: 714.8 on 11/22/2023 (was 400 on 10/11/2023) Guardant360 11/24/2023: T p53 mutations, ARID1A, GATA3 mutations present (no therapeutic targets), MSI high not detected PD-L1: CPS < 1  Chemo toxicities: Severe fatigue Abdominal bloating and distention: Encouraged her to use MiraLAX for constipation Neutropenia: Will prescribe Zarzio today. Elevated bilirubin: Total bilirubin is 4.0.  I educated the patient that if the bilirubin continues to get worse she is likely to be more confused and disoriented.  We will bring her in next week to reassess her and to see if she can be treated. I will request a  second opinion consultation with [facility] to see if she qualifies for any other clinical trials because she has p53 gene mutation. ------------------------------------- Assessment and Plan Assessment & Plan Chemotherapy-induced neutropenia Severe neutropenia with WBC 1.0 and neutrophils 0.4, precluding chemotherapy. Discussed cancer progression risk during delay, prioritizing avoidance of hospitalization. - Administer short-acting G-CSF injection. - Recheck blood counts in one week.  Chemotherapy-induced thrombocytopenia Platelet count decreased from 40 to 27, not primary reason for withholding chemotherapy. Focus remains on low WBC count. - Monitor platelet counts closely.  Chemotherapy-induced gastrointestinal symptoms Reports green stools and severe constipation. Green stool expected; constipation causing discomfort. - Recommend Miralax for constipation relief.  Fatigue secondary to chemotherapy Experiencing extreme fatigue, compounded by low blood counts and gastrointestinal symptoms. Supportive care necessary. - Monitor fatigue levels and provide supportive care.  Cancer treatment evaluation Current chemotherapy regimen's effectiveness uncertain. Immunotherapy ineffective due to low CPS score. Detected mutations lack FDA-approved treatments. Considering clinical trials as alternative. - Coordinate with [facility] for potential clinical trial enrollment. - Schedule appointment at [facility] to explore further treatment options.  Follow-up Concerned about chemotherapy delay and potential cancer progression. Monitoring blood counts to adjust treatment. - Recheck blood counts in one week. - will request appointment at [facility] within the next couple of weeks. - Consider resuming chemotherapy if blood counts improve.      Orders Placed This Encounter  Procedures   CBC with Differential (Cancer Center Only)    Standing Status:   Future    Expected Date:   12/13/2023    Expiration Date:   12/12/2024    CMP (Cancer Center only)    Standing Status:   Future    Expected Date:   12/13/2023    Expiration Date:   12/12/2024   Magnesium    Standing Status:   Future    Expected Date:   12/13/2023    Expiration Date:   12/12/2024   Phosphorus    Standing Status:   Future    Expected Date:   12/13/2023    Expiration Date:   12/12/2024   The patient has a good understanding of the overall plan. she agrees with it. she will call with  any problems that may develop before the next visit here. Total time spent: 30 mins including face to face time and time spent for planning, charting and co-ordination of care   Tamsen Meek, MD 12/06/23

## 2023-12-06 NOTE — Patient Instructions (Signed)
 Filgrastim Injection What is this medication? FILGRASTIM (fil GRA stim) lowers the risk of infection in people who are receiving chemotherapy. It works by Systems analyst make more white blood cells, which protects your body from infection. It may also be used to help people who have been exposed to high doses of radiation. It can be used to help prepare your body before a stem cell transplant. It works by helping your bone marrow make and release stem cells into the blood. This medicine may be used for other purposes; ask your health care provider or pharmacist if you have questions. COMMON BRAND NAME(S): Neupogen, Nivestym, Nypozi, Releuko, Zarxio What should I tell my care team before I take this medication? They need to know if you have any of these conditions: History of blood diseases, such as sickle cell anemia Kidney disease Recent or ongoing radiation An unusual or allergic reaction to filgrastim, pegfilgrastim, latex, rubber, other medications, foods, dyes, or preservatives Pregnant or trying to get pregnant Breast-feeding How should I use this medication? This medication is injected under the skin or into a vein. It is usually given by your care team in a hospital or clinic setting. It may be given at home. If you get this medication at home, you will be taught how to prepare and give it. Use exactly as directed. Take it as directed on the prescription label at the same time every day. Keep taking it unless your care team tells you to stop. It is important that you put your used needles and syringes in a special sharps container. Do not put them in a trash can. If you do not have a sharps container, call your pharmacist or care team to get one. This medication comes with INSTRUCTIONS FOR USE. Ask your pharmacist for directions on how to use this medication. Read the information carefully. Talk to your pharmacist or care team if you have questions. Talk to your care team about the use of  this medication in children. While it may be prescribed for children for selected conditions, precautions do apply. Overdosage: If you think you have taken too much of this medicine contact a poison control center or emergency room at once. NOTE: This medicine is only for you. Do not share this medicine with others. What if I miss a dose? It is important not to miss any doses. Talk to your care team about what to do if you miss a dose. What may interact with this medication? Medications that may cause a release of neutrophils, such as lithium This list may not describe all possible interactions. Give your health care provider a list of all the medicines, herbs, non-prescription drugs, or dietary supplements you use. Also tell them if you smoke, drink alcohol, or use illegal drugs. Some items may interact with your medicine. What should I watch for while using this medication? Your condition will be monitored carefully while you are receiving this medication. You may need bloodwork while taking this medication. Talk to your care team about your risk of cancer. You may be more at risk for certain types of cancer if you take this medication. What side effects may I notice from receiving this medication? Side effects that you should report to your care team as soon as possible: Allergic reactions--skin rash, itching, hives, swelling of the face, lips, tongue, or throat Capillary leak syndrome--stomach or muscle pain, unusual weakness or fatigue, feeling faint or lightheaded, decrease in the amount of urine, swelling of the ankles, hands,  or feet, trouble breathing High white blood cell level--fever, fatigue, trouble breathing, night sweats, change in vision, weight loss Inflammation of the aorta--fever, fatigue, back, chest, or stomach pain, severe headache Kidney injury (glomerulonephritis)--decrease in the amount of urine, red or dark brown urine, foamy or bubbly urine, swelling of the ankles, hands,  or feet Shortness of breath or trouble breathing Spleen injury--pain in upper left stomach or shoulder Unusual bruising or bleeding Side effects that usually do not require medical attention (report to your care team if they continue or are bothersome): Back pain Bone pain Fatigue Fever Headache Nausea This list may not describe all possible side effects. Call your doctor for medical advice about side effects. You may report side effects to FDA at 1-800-FDA-1088. Where should I keep my medication? Keep out of the reach of children and pets. Keep this medication in the original packaging until you are ready to take it. Protect from light. See product for storage information. Each product may have different instructions. Get rid of any unused medication after the expiration date. To get rid of medications that are no longer needed or have expired: Take the medication to a medications take-back program. Check with your pharmacy or law enforcement to find a location. If you cannot return the medication, ask your pharmacist or care team how to get rid of this medication safely. NOTE: This sheet is a summary. It may not cover all possible information. If you have questions about this medicine, talk to your doctor, pharmacist, or health care provider.  2024 Elsevier/Gold Standard (2022-01-08 00:00:00)

## 2023-12-07 LAB — CANCER ANTIGEN 27.29: CA 27.29: 830.8 U/mL — ABNORMAL HIGH (ref 0.0–38.6)

## 2023-12-08 ENCOUNTER — Encounter (HOSPITAL_COMMUNITY): Payer: Self-pay

## 2023-12-08 ENCOUNTER — Encounter: Payer: Self-pay | Admitting: Hematology and Oncology

## 2023-12-09 ENCOUNTER — Other Ambulatory Visit: Payer: Self-pay | Admitting: *Deleted

## 2023-12-09 ENCOUNTER — Encounter: Payer: Self-pay | Admitting: Hematology and Oncology

## 2023-12-09 ENCOUNTER — Inpatient Hospital Stay

## 2023-12-09 ENCOUNTER — Telehealth: Payer: Self-pay | Admitting: *Deleted

## 2023-12-09 ENCOUNTER — Inpatient Hospital Stay (HOSPITAL_BASED_OUTPATIENT_CLINIC_OR_DEPARTMENT_OTHER): Admitting: Adult Health

## 2023-12-09 VITALS — BP 136/85 | HR 143 | Temp 98.5°F | Resp 18 | Ht 65.0 in | Wt 150.5 lb

## 2023-12-09 DIAGNOSIS — Z170 Estrogen receptor positive status [ER+]: Secondary | ICD-10-CM | POA: Diagnosis not present

## 2023-12-09 DIAGNOSIS — C50311 Malignant neoplasm of lower-inner quadrant of right female breast: Secondary | ICD-10-CM | POA: Diagnosis not present

## 2023-12-09 DIAGNOSIS — C7951 Secondary malignant neoplasm of bone: Secondary | ICD-10-CM | POA: Diagnosis not present

## 2023-12-09 LAB — GUARDANT 360

## 2023-12-09 LAB — CBC WITH DIFFERENTIAL (CANCER CENTER ONLY)
Abs Immature Granulocytes: 0.12 10*3/uL — ABNORMAL HIGH (ref 0.00–0.07)
Basophils Absolute: 0 10*3/uL (ref 0.0–0.1)
Basophils Relative: 1 %
Eosinophils Absolute: 0 10*3/uL (ref 0.0–0.5)
Eosinophils Relative: 1 %
HCT: 31.5 % — ABNORMAL LOW (ref 36.0–46.0)
Hemoglobin: 11 g/dL — ABNORMAL LOW (ref 12.0–15.0)
Immature Granulocytes: 3 %
Lymphocytes Relative: 31 %
Lymphs Abs: 1.3 10*3/uL (ref 0.7–4.0)
MCH: 34.8 pg — ABNORMAL HIGH (ref 26.0–34.0)
MCHC: 34.9 g/dL (ref 30.0–36.0)
MCV: 99.7 fL (ref 80.0–100.0)
Monocytes Absolute: 0.8 10*3/uL (ref 0.1–1.0)
Monocytes Relative: 19 %
Neutro Abs: 1.9 10*3/uL (ref 1.7–7.7)
Neutrophils Relative %: 45 %
Platelet Count: 40 10*3/uL — ABNORMAL LOW (ref 150–400)
RBC: 3.16 MIL/uL — ABNORMAL LOW (ref 3.87–5.11)
RDW: 19.5 % — ABNORMAL HIGH (ref 11.5–15.5)
WBC Count: 4.2 10*3/uL (ref 4.0–10.5)
nRBC: 3.9 % — ABNORMAL HIGH (ref 0.0–0.2)

## 2023-12-09 LAB — HEPATIC FUNCTION PANEL
ALT: 48 U/L — ABNORMAL HIGH (ref 0–44)
AST: 112 U/L — ABNORMAL HIGH (ref 15–41)
Albumin: 2.9 g/dL — ABNORMAL LOW (ref 3.5–5.0)
Alkaline Phosphatase: 244 U/L — ABNORMAL HIGH (ref 38–126)
Bilirubin, Direct: 1.6 mg/dL — ABNORMAL HIGH (ref 0.0–0.2)
Indirect Bilirubin: 2.2 mg/dL — ABNORMAL HIGH (ref 0.3–0.9)
Total Bilirubin: 3.8 mg/dL — ABNORMAL HIGH (ref 0.0–1.2)
Total Protein: 5.4 g/dL — ABNORMAL LOW (ref 6.5–8.1)

## 2023-12-09 LAB — CMP (CANCER CENTER ONLY)
ALT: 44 U/L (ref 0–44)
AST: 104 U/L — ABNORMAL HIGH (ref 15–41)
Albumin: 3.5 g/dL (ref 3.5–5.0)
Alkaline Phosphatase: 284 U/L — ABNORMAL HIGH (ref 38–126)
Anion gap: 8 (ref 5–15)
BUN: 26 mg/dL — ABNORMAL HIGH (ref 8–23)
CO2: 25 mmol/L (ref 22–32)
Calcium: 9.1 mg/dL (ref 8.9–10.3)
Chloride: 100 mmol/L (ref 98–111)
Creatinine: 0.71 mg/dL (ref 0.44–1.00)
GFR, Estimated: >60 mL/min (ref >60)
Glucose, Bld: 117 mg/dL — ABNORMAL HIGH (ref 70–99)
Potassium: 4.7 mmol/L (ref 3.5–5.1)
Sodium: 133 mmol/L — ABNORMAL LOW (ref 135–145)
Total Bilirubin: 3.7 mg/dL (ref 0.0–1.2)
Total Protein: 5.6 g/dL — ABNORMAL LOW (ref 6.5–8.1)

## 2023-12-09 MED ORDER — SODIUM CHLORIDE 0.9 % IV SOLN: Freq: Once | INTRAVENOUS | Status: AC

## 2023-12-09 NOTE — Patient Instructions (Signed)

## 2023-12-09 NOTE — Patient Instructions (Signed)

## 2023-12-09 NOTE — Telephone Encounter (Signed)
 CRITICAL VALUE STICKER  CRITICAL VALUE: Total Bili 3.7  RECEIVER (on-site recipient of call):  DATE & TIME NOTIFIED: 4/10 @1432   MESSENGER (representative from lab): Byrd Hesselbach  MD NOTIFIED: Lindsey,NP  TIME OF NOTIFICATION: 1433  RESPONSE: Noted

## 2023-12-10 ENCOUNTER — Encounter: Payer: Self-pay | Admitting: Hematology and Oncology

## 2023-12-10 MED FILL — Fosaprepitant Dimeglumine For IV Infusion 150 MG (Base Eq): INTRAVENOUS | Qty: 5 | Status: AC

## 2023-12-10 NOTE — Progress Notes (Signed)
 [facility] Cancer Follow up:    Theresa Perches, MD [address] [place] [address]   DIAGNOSIS:  Cancer Staging  Malignant neoplasm of lower-inner quadrant of right breast of female, estrogen receptor positive (HCC) Staging form: Breast, AJCC 8th Edition - Clinical stage from 07/26/2019: Stage IA (cT1c, cN0, cM0, G2, ER+, PR-, HER2-) - Unsigned Stage prefix: Initial diagnosis Histologic grading system: 3 grade system Laterality: Right Staged by: Pathologist and managing physician Stage used in treatment planning: Yes National guidelines used in treatment planning: Yes Type of national guideline used in treatment planning: NCCN - Pathologic stage from 10/04/2019: Stage IIB (pT2, pN1a(sn), cM0, G2, ER+, PR-, HER2-) - Signed by Loa Socks, NP on 10/18/2019 Stage prefix: Initial diagnosis Method of lymph node assessment: Sentinel lymph node biopsy Histologic grading system: 3 grade system - Pathologic: Stage IV (pM1) - Signed by Loa Socks, NP on 02/19/2023    SUMMARY OF ONCOLOGIC HISTORY: Oncology History  Malignant neoplasm of lower-inner quadrant of right breast of female, estrogen receptor positive (HCC)  07/03/2019 Initial Diagnosis   right lower inner quadrant biopsy, for a clinicaly multiple T1c N0, stage IA invasive ductal carcinoma, grade 2, E-cadherin positive, strongly estrogen receptor positive but progesterone receptor negative and HER-2 not amplified, with an MIB-1 of 5%             (a) breast MRI 08/07/2019 showed 2 additional suspicious areas in the right breast and 1 in the left breast              (b) biopsy of all 3 areas 08/21/2019 showed no malignancy   07/26/2019 -  Anti-estrogen oral therapy   tamoxifen started neoadjuvantly 07/26/2019; to be continued for 10 years.  S/p TAH/BSO on 01/16/2020 with benign pathology   08/04/2019 Genetic Testing   Negative genetic testing:  No pathogenic variants detected on the  [facility] Breast Cancer STAT panel or the Common Hereditary Cancers panel. The report date is 08/04/2019.  The STAT Breast cancer panel offered by [facility] includes sequencing and rearrangement analysis for the following 9 genes:  ATM, BRCA1, BRCA2, CDH1, CHEK2, PALB2, PTEN, STK11 and TP53.  The Common Hereditary Cancers Panel offered by [facility] includes sequencing and/or deletion duplication testing of the following 48 genes: APC, ATM, AXIN2, BARD1, BMPR1A, BRCA1, BRCA2, BRIP1, CDH1, CDK4, CDKN2A (p14ARF), CDKN2A (p16INK4a), CHEK2, CTNNA1, DICER1, EPCAM (Deletion/duplication testing only), GREM1 (promoter region deletion/duplication testing only), KIT, MEN1, MLH1, MSH2, MSH3, MSH6, MUTYH, NBN, NF1, NHTL1, PALB2, PDGFRA, PMS2, POLD1, POLE, PTEN, RAD50, RAD51C, RAD51D, RNF43, SDHB, SDHC, SDHD, SMAD4, SMARCA4. STK11, TP53, TSC1, TSC2, and VHL.  The following genes were evaluated for sequence changes only: SDHA and HOXB13 c.251G>A variant only.    10/04/2019 Cancer Staging   Staging form: Breast, AJCC 8th Edition - Pathologic stage from 10/04/2019: Stage IIB (pT2, pN1a(sn), cM0, G2, ER+, PR-, HER2-) - Signed by Loa Socks, NP on 10/18/2019   10/04/2019 Surgery    right lumpectomy and sentinel lymph node sampling 10/04/2019 showed a pT2 pN1, stage IIB invasive ductal carcinoma, grade 2, with positive lymphovascular invasion and a positive superior margin             (a) 3 sentinel lymph nodes removed, one with macro metastatic deposit, 1 with a micrometastatic deposit             (b) additional surgery for margin clearance   10/04/2019 Miscellaneous    MammaPrint on the 10/04/2019 sample shows a low risk  luminal A tumor predicting a 5-year metastasis free survival of 96% without chemotherapy, and a chemotherapy benefit of less than 1.5%    11/27/2019 - 01/05/2020 Radiation Therapy    Site Technique Total Dose (Gy) Dose per Fx (Gy) Completed Fx Beam Energies  Breast, Right: Breast_Rt 3D 50/50 2 25/25 6X,  10X  Breast, Right: Breast_Rt_SCV_PAB 3D 50/50 2 25/25 6X, 10X  Breast, Right: Breast_Rt_Bst 3D 10/10 2 5/5 6X, 10X     09/29/2022 Imaging   IMPRESSION: 1. Surgical changes involving the right medial breast. No obvious recurrent breast mass or chest wall mass. 2. Mediastinal and hilar lymphadenopathy suggesting metastatic disease. 3. Patchy tree-in-bud type nodularity in the lungs suggesting chronic inflammation or atypical infection such as MAC. No definite pulmonary metastatic nodules. 4. Large partially necrotic mass involving most of the right hepatic lobe and a 2.5 cm lesion in segment 3 of the liver consistent with metastatic disease. 5. 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   10/06/2022 Initial Biopsy   Liver biopsy: + for malignancy, metastatic poorly differentiated carcinoma, likely breast origin, biomarkers pending, insufficient tissue for molecular testing. (Resulted in EPIC on 10/13/2022)   10/07/2022 PET scan   PET scan on 10/07/2022 that showed hypermetabolic metastatic breast cancer with hypermetabolic mediastinal and hilar adenopathy, hypermetabolic hepatic metastatic disease and bone disease.  There were no findings for pulmonary metastatic disease.   10/21/2022 -  Anti-estrogen oral therapy   Verzinio and Letrozole   01/15/2023 - 11/01/2023 Chemotherapy   Patient is on Treatment Plan : BREAST METASTATIC Fam-Trastuzumab Deruxtecan-nxki (Enhertu) (5.4) q21d     02/19/2023 Cancer Staging   Staging form: Breast, AJCC 8th Edition - Pathologic: Stage IV (pM1) - Signed by Loa Socks, NP on 02/19/2023   11/29/2023 -  Chemotherapy   Patient is on Treatment Plan : BREAST Sacituzumab govitecan-hziy Drinda Butts) D1,8 q21d     Cancer, metastatic to bone (HCC)  12/21/2022 Initial Diagnosis   Cancer, metastatic to bone (HCC)   01/15/2023 - 11/01/2023 Chemotherapy   Patient is on Treatment Plan : BREAST METASTATIC Fam-Trastuzumab  Deruxtecan-nxki (Enhertu) (5.4) q21d     11/29/2023 -  Chemotherapy   Patient is on Treatment Plan : BREAST Sacituzumab govitecan-hziy Drinda Butts) D1,8 q21d       CURRENT THERAPY: Trodelvy cycle 1 day 10 (did not receive day 8 treatment--it was held due to neutropenia)  INTERVAL HISTORY:  Discussed the use of AI scribe software for clinical note transcription with the patient, who gave verbal consent to proceed.  Theresa [name] 69 y.o. female with a history of elevated bilirubin levels, presents with recent episodes of vomiting, bloating, and constipation. The vomiting episode was characterized by the expulsion of a large volume of dark brown, liquid substance, initially suspected to be blood. However, the patient noted that the vomitus had no smell. Following the vomiting episode, the patient reported feeling fine and has been able to keep down fluids, including orange juice and yogurt. The patient also reports experiencing right hip pain, the cause of which is currently unknown. The patient denies any new headaches, vision changes, shortness of breath, but does report a cough which she attributes to sitting outside in the pollen.   Patient Active Problem List   Diagnosis Date Noted   Acute respiratory failure with hypoxia (HCC) 09/02/2023   Essential hypertension 09/02/2023   Pulmonary hypertension (HCC) 02/15/2023   Community acquired pneumonia of right lower lobe of lung 01/12/2023   Hypoxic  respiratory failure (HCC) 01/11/2023   LFT elevation 01/11/2023   Hepatomegaly 01/11/2023   Compression fracture of T9 vertebra (HCC) 01/11/2023   Compression fracture of T10 vertebra (HCC) 01/11/2023   Cancer, metastatic to bone (HCC) 12/21/2022   Primary cancer of bone with metastasis to other site [facility]) 11/30/2022   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  MEDICAL HISTORY: Past Medical History:  Diagnosis Date   Breast cancer, right (HCC)    Contact lens/glasses fitting    wears contacts or glasses   Diarrhea    --post gallbladder surgery   Family history of bone cancer    Family history of brain cancer    Family history of stomach cancer    Family history of uterine cancer    Fibroids    History of radiation therapy    Hypertension    No pertinent past medical history    Personal history of radiation therapy    PONV (postoperative nausea and vomiting)    Right ovarian cyst    Vertigo     SURGICAL HISTORY: Past Surgical History:  Procedure Laterality Date   BLADDER SUSPENSION     BREAST BIOPSY  09/08/2012   Procedure: BREAST BIOPSY;  Surgeon: Adolph Pollack, MD;  Location: [facility];  Service: General;  Laterality: Left;  remove left breast mass   BREAST LUMPECTOMY Right 10/2019   BREAST LUMPECTOMY WITH RADIOACTIVE SEED AND SENTINEL LYMPH NODE BIOPSY Right 10/04/2019   Procedure: RIGHT BREAST LUMPECTOMY WITH BRACKETED RADIOACTIVE SEEDS, RIGHT BREAST RADIOACTIVE SEED GUIDED EXCISION BIOPSY, AND RIGHT SENTINEL LYMPH NODE BIOPSY;  Surgeon: Almond Lint, MD;  Location: [facility];  Service: General;  Laterality: Right;   BREAST SURGERY     lumpectomy x2   BUNIONECTOMY Right 01/2016   CHOLECYSTECTOMY     COLONOSCOPY     CYSTOSCOPY N/A 01/16/2020   Procedure: CYSTOSCOPY;  Surgeon: Patton Salles, MD;  Location: [facility];  Service: Gynecology;  Laterality: N/A;   IR BONE TUMOR(S)RF ABLATION  12/10/2022    IR IMAGING GUIDED PORT INSERTION  01/18/2023   IR KYPHO THORACIC WITH BONE BIOPSY  12/10/2022   IR RADIOLOGIST EVAL & MGMT  12/24/2022   MASTOPEXY Right 10/04/2019   Procedure: RIGHT BREAST MASTOPEXY;  Surgeon: Almond Lint, MD;  Location: [facility];  Service: General;  Laterality: Right;   RE-EXCISION OF BREAST LUMPECTOMY Right 10/31/2019   Procedure: RIGHT RE-EXCISION OF BREAST LUMPECTOMY;  Surgeon: Almond Lint, MD;  Location: [facility];  Service: General;  Laterality: Right;   TOTAL LAPAROSCOPIC HYSTERECTOMY WITH BILATERAL SALPINGO OOPHORECTOMY N/A 01/16/2020   Procedure: TOTAL LAPAROSCOPIC HYSTERECTOMY WITH BILATERAL SALPINGO OOPHORECTOMY/COLLECTION OF PELVIC WASHINGS, LYSIS OF ADHESIONS;  Surgeon: Patton Salles, MD;  Location: [facility];  Service: Gynecology;  Laterality: N/A;  collection of pelvic washings   TUBAL LIGATION      SOCIAL HISTORY: Social History   Socioeconomic  History   Marital status: Married    Spouse name: Daryl    Number of children: Not on file   Years of education: Not on file   Highest education level: Not on file  Occupational History   Not on file  Tobacco Use   Smoking status: Former    Current packs/day: 0.00    Types: Cigarettes    Quit date: 03/15/1996    Years since quitting: 27.7   Smokeless tobacco: Never  Vaping Use   Vaping status: Never Used  Substance and Sexual Activity   Alcohol use: Not Currently    Comment: occ wine   Drug use: No   Sexual activity: Yes    Partners: Male    Birth control/protection: Surgical    Comment: BTL/hyst  Other Topics Concern   Not on file  Social History Narrative   Not on file   Social Drivers of Health   Financial Resource Strain: Low Risk  (12/08/2023)   Received from [facility]   Overall Financial Resource Strain (CARDIA)    Difficulty of Paying Living Expenses: Not very hard  Food Insecurity: No Food Insecurity (12/08/2023)    Received from [facility]   Hunger Vital Sign    Worried About Running Out of Food in the Last Year: Never true    Ran Out of Food in the Last Year: Never true  Transportation Needs: No Transportation Needs (12/08/2023)   Received from [facility]   PRAPARE - Transportation    Lack of Transportation (Medical): No    Lack of Transportation (Non-Medical): No  Physical Activity: Not on file  Stress: Not on file  Social Connections: Socially Integrated (09/03/2023)   Social Connection and Isolation Panel [NHANES]    Frequency of Communication with Friends and Family: More than three times a week    Frequency of Social Gatherings with Friends and Family: Once a week    Attends Religious Services: 1 to 4 times per year    Active Member of [employer] or Organizations: Yes    Attends Banker Meetings: Never    Marital Status: Married  Catering manager Violence: Not At Risk (09/03/2023)   Humiliation, Afraid, Rape, and Kick questionnaire    Fear of Current or Ex-Partner: No    Emotionally Abused: No    Physically Abused: No    Sexually Abused: No    FAMILY HISTORY: Family History  Problem Relation Age of Onset   Hypertension Father    Stomach cancer Father        may have been colon, diagnosed in his 40s   Depression Mother    Dementia Mother    Thyroid disease Sister    Heart disease Sister    Brain cancer Maternal Aunt 33   Cancer Maternal Grandmother        undetermined type, diagnosed in her 28s   Bone cancer Paternal Grandfather 30   Uterine cancer Paternal Aunt 39   Uterine cancer Cousin        paternal 1st cousin, diagnosed in her late 23s   Uterine cancer Cousin 4       paternal 1st cousin    Review of Systems  Constitutional:  Positive for fatigue. Negative for appetite change, chills, fever and unexpected weight change.  HENT:   Negative for hearing loss, lump/mass and trouble swallowing.   Eyes:  Negative for eye problems and icterus.  Respiratory:   Negative for chest tightness, cough and shortness of  breath.   Cardiovascular:  Negative for chest pain, leg swelling and palpitations.  Gastrointestinal:  Positive for nausea and vomiting. Negative for abdominal distention, abdominal pain, blood in stool, constipation, diarrhea and rectal pain.  Endocrine: Negative for hot flashes.  Genitourinary:  Negative for difficulty urinating.   Musculoskeletal:  Negative for arthralgias.  Skin:  Negative for itching and rash.  Neurological:  Negative for dizziness, extremity weakness, headaches and numbness.  Hematological:  Negative for adenopathy. Does not bruise/bleed easily.  Psychiatric/Behavioral:  Negative for depression. The patient is not nervous/anxious.       PHYSICAL EXAMINATION    Vitals:   12/09/23 1352  BP: 136/85  Pulse: (!) 143  Resp: 18  Temp: 98.5 F (36.9 C)  SpO2: 96%    Physical Exam Constitutional:      General: She is not in acute distress.    Appearance: Normal appearance. She is not toxic-appearing.  HENT:     Head: Normocephalic and atraumatic.     Mouth/Throat:     Mouth: Mucous membranes are moist.     Pharynx: Oropharynx is clear. No oropharyngeal exudate or posterior oropharyngeal erythema.  Eyes:     General: No scleral icterus. Cardiovascular:     Rate and Rhythm: Normal rate and regular rhythm.     Pulses: Normal pulses.     Heart sounds: Normal heart sounds.  Pulmonary:     Effort: Pulmonary effort is normal.     Breath sounds: Normal breath sounds.  Abdominal:     General: Abdomen is flat. Bowel sounds are normal. There is no distension.     Palpations: Abdomen is soft. There is hepatomegaly.     Tenderness: There is no abdominal tenderness.  Musculoskeletal:        General: No swelling.     Cervical back: Neck supple.  Lymphadenopathy:     Cervical: No cervical adenopathy.  Skin:    General: Skin is warm and dry.     Findings: No rash.  Neurological:     General: No focal deficit  present.     Mental Status: She is alert.  Psychiatric:        Mood and Affect: Mood normal.        Behavior: Behavior normal.     LABORATORY DATA:  CBC    Component Value Date/Time   WBC 4.2 12/09/2023 1337   WBC 5.8 10/11/2023 1409   RBC 3.16 (L) 12/09/2023 1337   HGB 11.0 (L) 12/09/2023 1337   HGB 10.8 (L) 01/19/2020 0911   HGB 12.8 03/15/2013 1434   HCT 31.5 (L) 12/09/2023 1337   HCT 32.8 (L) 01/19/2020 0911   PLT 40 (L) 12/09/2023 1337   PLT 216 01/19/2020 0911   MCV 99.7 12/09/2023 1337   MCV 94 01/19/2020 0911   MCH 34.8 (H) 12/09/2023 1337   MCHC 34.9 12/09/2023 1337   RDW 19.5 (H) 12/09/2023 1337   RDW 12.5 01/19/2020 0911   LYMPHSABS 1.3 12/09/2023 1337   LYMPHSABS 0.8 01/19/2020 0911   MONOABS 0.8 12/09/2023 1337   EOSABS 0.0 12/09/2023 1337   EOSABS 0.2 01/19/2020 0911   BASOSABS 0.0 12/09/2023 1337   BASOSABS 0.0 01/19/2020 0911    CMP     Component Value Date/Time   NA 133 (L) 12/09/2023 1337   K 4.7 12/09/2023 1337   CL 100 12/09/2023 1337   CO2 25 12/09/2023 1337   GLUCOSE 117 (H) 12/09/2023 1337   BUN 26 (H) 12/09/2023 1337  CREATININE 0.71 12/09/2023 1337   CALCIUM 9.1 12/09/2023 1337   PROT 5.6 (L) 12/09/2023 1337   PROT 5.4 (L) 12/09/2023 1337   ALBUMIN 3.5 12/09/2023 1337   ALBUMIN 2.9 (L) 12/09/2023 1337   AST 104 (H) 12/09/2023 1337   AST 112 (H) 12/09/2023 1337   ALT 44 12/09/2023 1337   ALT 48 (H) 12/09/2023 1337   ALKPHOS 284 (H) 12/09/2023 1337   ALKPHOS 244 (H) 12/09/2023 1337   BILITOT 3.7 (HH) 12/09/2023 1337   BILITOT 3.8 (H) 12/09/2023 1337   GFRNONAA >60 12/09/2023 1337   GFRAA >60 03/08/2020 1001     ASSESSMENT and THERAPY PLAN:   Malignant neoplasm of lower-inner quadrant of right breast of female, estrogen receptor positive (HCC) 07/03/2019: Right breast LIQ T1CN0 stage Ia grade 2 IDC ER 95% PR negative HER2 negative Ki-67 5% 10/04/2019: Right lumpectomy: T2N1 stage IIb grade 2 IDC positive lymphovascular invasion  3 lymph nodes removed 1 with macro N1 micrometastatic disease 10/04/2019: MammaPrint: Low risk 01/05/2020: Completed adjuvant radiation 08/04/2019: Genetics: Negative November 2020: Tamoxifen 01/15/2023-11/29/2023: Enhertu ------------------------------------------------------------------- 09/28/2022: Patient presented with platelets 65 INR 1.9 elevated LFTs and hypercalcemia  CT CAP 09/29/2022: Mediastinal and hilar lymphadenopathy suggestive of metastatic disease.  Patchy tree-in-bud nodularity in the lungs, partially necrotic mass right hepatic lobe 12 cm, also 2.5 cm lesion lytic bone metastases in spine pelvis and left scapula   Treatment plan: Liver biopsy: Poorly diff cancer ER 30-40%, PR:0%, Her 2 Neg Verzinio with AI started 10/24/2022 discontinued May 2024 due to progression in the liver 3.  Bone Marrow Biopsy: 10/13/22: Positive for met breast cancer:   4.  Hospitalization 01/11/2023-01/21/2023: Severe jaundice due to progression in the liver 5.  Perative radiation to the spine 6. PET CT scan: 11/19/2023: liver metastases with 2 new hypermetabolic lesions.  New multifocal radiotracer activity in the marrow spaces.   -------------------------------------------------------------------------------------------------------------------- Current treatment: Trodelvy cycle 1 day 10  Vomiting Experienced significant vomiting episode, resolved without further symptoms. Awaiting lab results for bilirubin levels. - Monitor for recurrence. - Await lab results. - Consider imaging if symptoms persist or worsen. - Continue anti-emetics if needed  Tachycardia Heart rate at 144 bpm, likely secondary to dehydration. Patient is asymptomatic - EKG reviewed with Dr. Pamelia Hoit, Sinus Tachycardia - Administer IV fluids today  - Monitor heart rate and symptoms.  Right hip pain Reports right hip pain, further evaluation needed if persistent. - Evaluate further if symptoms persist.  Follow-up Scheduled for  follow-up to evaluate white blood cell count post-shot. - Ensure follow-up appointment on Monday.      All questions were answered. The patient knows to call the clinic with any problems, questions or concerns. We can certainly see the patient much sooner if necessary.  Total encounter time:20 minutes*in face-to-face visit time, chart review, lab review, care coordination, order entry, and documentation of the encounter time.    Lillard Anes, NP 12/10/23 12:29 PM Medical Oncology and Hematology [facility] [address] [place], [place] [address] Tel. [phone]    Fax. [phone]  *Total Encounter Time as defined by the Centers for Medicare and Medicaid Services includes, in addition to the face-to-face time of a patient visit (documented in the note above) non-face-to-face time: obtaining and reviewing outside history, ordering and reviewing medications, tests or procedures, care coordination (communications with other health care professionals or caregivers) and documentation in the medical record.

## 2023-12-10 NOTE — Assessment & Plan Note (Deleted)
 Theresa [name] is a 68 year old woman with metastatic breast cancer who started treatment on Enhertu.

## 2023-12-10 NOTE — Assessment & Plan Note (Signed)
 07/03/2019: Right breast LIQ T1CN0 stage Ia grade 2 IDC ER 95% PR negative HER2 negative Ki-67 5% 10/04/2019: Right lumpectomy: T2N1 stage IIb grade 2 IDC positive lymphovascular invasion 3 lymph nodes removed 1 with macro N1 micrometastatic disease 10/04/2019: MammaPrint: Low risk 01/05/2020: Completed adjuvant radiation 08/04/2019: Genetics: Negative November 2020: Tamoxifen 01/15/2023-11/29/2023: Enhertu ------------------------------------------------------------------- 09/28/2022: Patient presented with platelets 65 INR 1.9 elevated LFTs and hypercalcemia  CT CAP 09/29/2022: Mediastinal and hilar lymphadenopathy suggestive of metastatic disease.  Patchy tree-in-bud nodularity in the lungs, partially necrotic mass right hepatic lobe 12 cm, also 2.5 cm lesion lytic bone metastases in spine pelvis and left scapula   Treatment plan: Liver biopsy: Poorly diff cancer ER 30-40%, PR:0%, Her 2 Neg Verzinio with AI started 10/24/2022 discontinued May 2024 due to progression in the liver 3.  Bone Marrow Biopsy: 10/13/22: Positive for met breast cancer:   4.  Hospitalization 01/11/2023-01/21/2023: Severe jaundice due to progression in the liver 5.  Perative radiation to the spine 6. PET CT scan: 11/19/2023: liver metastases with 2 new hypermetabolic lesions.  New multifocal radiotracer activity in the marrow spaces.   -------------------------------------------------------------------------------------------------------------------- Current treatment: Trodelvy cycle 1 day 10  Vomiting Experienced significant vomiting episode, resolved without further symptoms. Awaiting lab results for bilirubin levels. - Monitor for recurrence. - Await lab results. - Consider imaging if symptoms persist or worsen. - Continue anti-emetics if needed  Tachycardia Heart rate at 144 bpm, likely secondary to dehydration. Patient is asymptomatic - EKG reviewed with Dr. Pamelia Hoit, Sinus Tachycardia - Administer IV fluids today  -  Monitor heart rate and symptoms.  Right hip pain Reports right hip pain, further evaluation needed if persistent. - Evaluate further if symptoms persist.  Follow-up Scheduled for follow-up to evaluate white blood cell count post-shot. - Ensure follow-up appointment on Monday.

## 2023-12-11 ENCOUNTER — Emergency Department (HOSPITAL_COMMUNITY)

## 2023-12-11 ENCOUNTER — Other Ambulatory Visit: Payer: Self-pay

## 2023-12-11 ENCOUNTER — Emergency Department (HOSPITAL_COMMUNITY)
Admission: EM | Admit: 2023-12-11 | Discharge: 2023-12-30 | Disposition: E | Attending: Emergency Medicine | Admitting: Emergency Medicine

## 2023-12-11 ENCOUNTER — Encounter: Payer: Self-pay | Admitting: Hematology and Oncology

## 2023-12-11 DIAGNOSIS — R17 Unspecified jaundice: Secondary | ICD-10-CM | POA: Insufficient documentation

## 2023-12-11 DIAGNOSIS — C787 Secondary malignant neoplasm of liver and intrahepatic bile duct: Secondary | ICD-10-CM | POA: Insufficient documentation

## 2023-12-11 DIAGNOSIS — C7981 Secondary malignant neoplasm of breast: Secondary | ICD-10-CM | POA: Diagnosis not present

## 2023-12-11 DIAGNOSIS — K922 Gastrointestinal hemorrhage, unspecified: Secondary | ICD-10-CM | POA: Insufficient documentation

## 2023-12-11 DIAGNOSIS — I469 Cardiac arrest, cause unspecified: Secondary | ICD-10-CM | POA: Insufficient documentation

## 2023-12-11 DIAGNOSIS — R000 Tachycardia, unspecified: Secondary | ICD-10-CM | POA: Diagnosis not present

## 2023-12-11 DIAGNOSIS — Z79899 Other long term (current) drug therapy: Secondary | ICD-10-CM | POA: Diagnosis not present

## 2023-12-11 DIAGNOSIS — I959 Hypotension, unspecified: Secondary | ICD-10-CM | POA: Insufficient documentation

## 2023-12-11 DIAGNOSIS — R4182 Altered mental status, unspecified: Secondary | ICD-10-CM | POA: Insufficient documentation

## 2023-12-11 DIAGNOSIS — I10 Essential (primary) hypertension: Secondary | ICD-10-CM | POA: Insufficient documentation

## 2023-12-11 LAB — CBG MONITORING, ED
Glucose-Capillary: 172 mg/dL — ABNORMAL HIGH (ref 70–99)
Glucose-Capillary: 172 mg/dL — ABNORMAL HIGH (ref 70–99)

## 2023-12-11 LAB — COMPREHENSIVE METABOLIC PANEL WITH GFR
ALT: 61 U/L — ABNORMAL HIGH (ref 0–44)
AST: 165 U/L — ABNORMAL HIGH (ref 15–41)
Albumin: 2.1 g/dL — ABNORMAL LOW (ref 3.5–5.0)
Alkaline Phosphatase: 145 U/L — ABNORMAL HIGH (ref 38–126)
Anion gap: 21 — ABNORMAL HIGH (ref 5–15)
BUN: 59 mg/dL — ABNORMAL HIGH (ref 8–23)
CO2: 10 mmol/L — ABNORMAL LOW (ref 22–32)
Calcium: 9 mg/dL (ref 8.9–10.3)
Chloride: 97 mmol/L — ABNORMAL LOW (ref 98–111)
Creatinine, Ser: 1.4 mg/dL — ABNORMAL HIGH (ref 0.44–1.00)
GFR, Estimated: 41 mL/min — ABNORMAL LOW (ref >60)
Glucose, Bld: 196 mg/dL — ABNORMAL HIGH (ref 70–99)
Potassium: 5.6 mmol/L — ABNORMAL HIGH (ref 3.5–5.1)
Sodium: 128 mmol/L — ABNORMAL LOW (ref 135–145)
Total Bilirubin: 2.8 mg/dL — ABNORMAL HIGH (ref 0.0–1.2)
Total Protein: 4 g/dL — ABNORMAL LOW (ref 6.5–8.1)

## 2023-12-11 LAB — CBC WITH DIFFERENTIAL/PLATELET
Abs Immature Granulocytes: 1 10*3/uL — ABNORMAL HIGH (ref 0.00–0.07)
Band Neutrophils: 1 %
Basophils Absolute: 0 10*3/uL (ref 0.0–0.1)
Basophils Relative: 0 %
Eosinophils Absolute: 0 10*3/uL (ref 0.0–0.5)
Eosinophils Relative: 0 %
HCT: 17 % — ABNORMAL LOW (ref 36.0–46.0)
Hemoglobin: 5.3 g/dL — CL (ref 12.0–15.0)
Lymphocytes Relative: 33 %
Lymphs Abs: 2 10*3/uL (ref 0.7–4.0)
MCH: 35.3 pg — ABNORMAL HIGH (ref 26.0–34.0)
MCHC: 31.2 g/dL (ref 30.0–36.0)
MCV: 113.3 fL — ABNORMAL HIGH (ref 80.0–100.0)
Metamyelocytes Relative: 15 %
Monocytes Absolute: 0.3 10*3/uL (ref 0.1–1.0)
Monocytes Relative: 5 %
Myelocytes: 2 %
Neutro Abs: 2.7 10*3/uL (ref 1.7–7.7)
Neutrophils Relative %: 44 %
Platelets: 31 10*3/uL — ABNORMAL LOW (ref 150–400)
RBC: 1.5 MIL/uL — ABNORMAL LOW (ref 3.87–5.11)
RDW: 20.7 % — ABNORMAL HIGH (ref 11.5–15.5)
WBC: 6 10*3/uL (ref 4.0–10.5)
nRBC: 7 % — ABNORMAL HIGH (ref 0.0–0.2)
nRBC: 8 /100{WBCs} — ABNORMAL HIGH

## 2023-12-11 LAB — RESP PANEL BY RT-PCR (RSV, FLU A&B, COVID)  RVPGX2
Influenza A by PCR: NEGATIVE
Influenza B by PCR: NEGATIVE
Resp Syncytial Virus by PCR: NEGATIVE
SARS Coronavirus 2 by RT PCR: NEGATIVE

## 2023-12-11 LAB — PROTIME-INR
INR: 3.7 — ABNORMAL HIGH (ref 0.8–1.2)
Prothrombin Time: 36.8 s — ABNORMAL HIGH (ref 11.4–15.2)

## 2023-12-11 LAB — AMMONIA: Ammonia: 184 umol/L — ABNORMAL HIGH (ref 9–35)

## 2023-12-11 LAB — LACTIC ACID, PLASMA: Lactic Acid, Venous: >9 mmol/L (ref 0.5–1.9)

## 2023-12-11 MED ORDER — LACTATED RINGERS IV BOLUS (SEPSIS): 250.0000 mL | Freq: Once | INTRAVENOUS | Status: DC

## 2023-12-11 MED ORDER — VANCOMYCIN HCL 1500 MG/300ML IV SOLN: 1500.0000 mg | Freq: Once | INTRAVENOUS | Status: DC

## 2023-12-11 MED ORDER — MORPHINE SULFATE (PF) 4 MG/ML IV SOLN: 10.0000 mg | Freq: Once | INTRAVENOUS | Status: AC

## 2023-12-11 MED ORDER — LACTATED RINGERS IV BOLUS (SEPSIS)
1000.0000 mL | Freq: Once | INTRAVENOUS | Status: AC
  Administered 2023-12-11: 1000 mL via INTRAVENOUS

## 2023-12-11 MED ORDER — MORPHINE SULFATE (PF) 4 MG/ML IV SOLN
INTRAVENOUS | Status: AC
Start: 2023-12-11 — End: 2023-12-11
  Administered 2023-12-11: 10 mg via INTRAVENOUS
  Filled 2023-12-11: qty 3

## 2023-12-11 MED ORDER — IOHEXOL 350 MG/ML SOLN: 100.0000 mL | Freq: Once | INTRAVENOUS | Status: DC | PRN

## 2023-12-11 MED ORDER — SODIUM BICARBONATE 8.4 % IV SOLN
INTRAVENOUS | Status: AC | PRN
  Administered 2023-12-11: 50 meq via INTRAVENOUS

## 2023-12-11 MED ORDER — ETOMIDATE 2 MG/ML IV SOLN
INTRAVENOUS | Status: AC | PRN
  Administered 2023-12-11: 30 mg via INTRAVENOUS

## 2023-12-11 MED ORDER — ETOMIDATE 2 MG/ML IV SOLN
INTRAVENOUS | Status: AC
  Filled 2023-12-11: qty 20

## 2023-12-11 MED ORDER — LORAZEPAM 2 MG/ML IJ SOLN
INTRAMUSCULAR | Status: AC
Start: 2023-12-11 — End: 2023-12-11
  Administered 2023-12-11: 1 mg via INTRAVENOUS
  Filled 2023-12-11: qty 1

## 2023-12-11 MED ORDER — LACTATED RINGERS IV SOLN: INTRAVENOUS | Status: DC

## 2023-12-11 MED ORDER — SUCCINYLCHOLINE CHLORIDE 200 MG/10ML IV SOSY
PREFILLED_SYRINGE | INTRAVENOUS | Status: DC
Start: 2023-12-11 — End: 2023-12-12
  Filled 2023-12-11: qty 10

## 2023-12-11 MED ORDER — EPINEPHRINE 1 MG/10ML IJ SOSY
PREFILLED_SYRINGE | INTRAMUSCULAR | Status: AC | PRN
  Administered 2023-12-11: 1 mg via INTRAVENOUS

## 2023-12-11 MED ORDER — SUCCINYLCHOLINE CHLORIDE 20 MG/ML IJ SOLN
INTRAMUSCULAR | Status: AC | PRN
  Administered 2023-12-11: 100 mg via INTRAVENOUS

## 2023-12-11 MED ORDER — NOREPINEPHRINE 4 MG/250ML-% IV SOLN
0.0000 ug/min | INTRAVENOUS | Status: DC
  Administered 2023-12-11: 2 ug/min via INTRAVENOUS
  Filled 2023-12-11: qty 250

## 2023-12-11 MED ORDER — VANCOMYCIN HCL IN DEXTROSE 1-5 GM/200ML-% IV SOLN: 1000.0000 mg | Freq: Once | INTRAVENOUS | Status: DC

## 2023-12-11 MED ORDER — EPINEPHRINE 1 MG/10ML IJ SOSY
PREFILLED_SYRINGE | INTRAMUSCULAR | Status: AC | PRN
Start: 2023-12-11 — End: 2023-12-11
  Administered 2023-12-11 (×3): 1 mg via INTRAVENOUS

## 2023-12-11 MED ORDER — LORAZEPAM 2 MG/ML IJ SOLN: 1.0000 mg | Freq: Once | INTRAMUSCULAR | Status: AC

## 2023-12-11 MED ORDER — LACTATED RINGERS IV BOLUS (SEPSIS): 1000.0000 mL | Freq: Once | INTRAVENOUS | Status: DC

## 2023-12-11 MED ORDER — PIPERACILLIN-TAZOBACTAM 3.375 G IVPB 30 MIN: 3.3750 g | Freq: Once | INTRAVENOUS | Status: DC

## 2023-12-13 ENCOUNTER — Other Ambulatory Visit

## 2023-12-13 ENCOUNTER — Ambulatory Visit: Admitting: Hematology and Oncology

## 2023-12-14 ENCOUNTER — Ambulatory Visit

## 2023-12-14 ENCOUNTER — Other Ambulatory Visit

## 2023-12-14 ENCOUNTER — Other Ambulatory Visit: Payer: Medicare HMO

## 2023-12-14 ENCOUNTER — Ambulatory Visit: Payer: Medicare HMO

## 2023-12-14 ENCOUNTER — Ambulatory Visit: Payer: Medicare HMO | Admitting: Adult Health

## 2023-12-16 LAB — CULTURE, BLOOD (ROUTINE X 2): Special Requests: ADEQUATE

## 2023-12-20 ENCOUNTER — Other Ambulatory Visit

## 2023-12-20 ENCOUNTER — Ambulatory Visit

## 2023-12-20 ENCOUNTER — Ambulatory Visit: Admitting: Adult Health

## 2023-12-28 ENCOUNTER — Encounter: Payer: Self-pay | Admitting: Hematology and Oncology

## 2023-12-30 NOTE — ED Provider Notes (Signed)
 [place] EMERGENCY DEPARTMENT AT [facility] Provider Note   CSN: [id number] Arrival date & time: 12/02/2023  1511     History  No chief complaint on file.   Theresa [name] is a 68 y.o. female.  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  CT scans from July of last year showed that she had bulky liver masses  The patient had a message back-and-forth with the oncologist today, they had tried to communicate with the oncology team that there was tachycardia as well as some increasing jaundice, they did not hear back   Her last infusion was on April 10, 2 days ago.  Evidently there was a call for syncope today, the patient's family members were trying to get her into the car when she passed out, she has been severely weak, there is no reports of fevers but she was actually cold to the touch tachycardic and hypotensive for the paramedics who found her to be 80/60.  They started her on some IV fluids. Home Medications Prior to Admission medications   Medication Sig Start Date End Date Taking? Authorizing Provider  dexamethasone (DECADRON) 4 MG tablet Take 1 tablets daily for 2 days. Start the day after chemotherapy. 11/22/23  Yes Gudena, Vinay, MD  furosemide (LASIX) 20 MG tablet TAKE 1 TABLET BY MOUTH EVERY DAY WITH BREAKFAST Patient taking differently: Take 20 mg by mouth daily. 07/07/23  Yes Gudena, Vinay, MD  lidocaine-prilocaine (EMLA) cream Apply to affected area once Patient taking differently: Apply 1 Application topically once. Apply to affected area once 11/22/23  Yes Gudena, Vinay,  MD  spironolactone (ALDACTONE) 25 MG tablet TAKE 1 TABLET (25 MG TOTAL) BY MOUTH DAILY. 11/04/23  Yes Gudena, Vinay, MD  valsartan (DIOVAN) 40 MG tablet Take 40 mg by mouth at bedtime. 07/13/23  Yes [provider]  venlafaxine XR (EFFEXOR-XR) 37.5 MG 24 hr capsule Take 1 capsule (37.5 mg total) by mouth daily. 04/02/23  Yes Gudena, Vinay, MD  docusate sodium (COLACE) 100 MG capsule Take 1 capsule (100 mg total) by mouth daily. Patient not taking: Reported on 11/01/2023 04/09/23   Gudena, Vinay, MD  loperamide (IMODIUM) 2 MG capsule Take 2 tabs by mouth with first loose stool, then 1 tab with each additional loose stool as needed. Do not exceed 8 tabs in a 24-hour period 11/22/23   Gudena, Vinay, MD  LORazepam (ATIVAN) 0.5 MG tablet Take 1 tablet (0.5 mg total) by mouth every 8 (eight) hours as needed. for anxiety 07/23/23   Gudena, Vinay, MD  methocarbamol (ROBAXIN-750) 750 MG tablet Take 1 tablet (750 mg total) by mouth every 8 (eight) hours as needed for muscle spasms. 11/29/23   Gudena, Vinay, MD  omeprazole (PRILOSEC) 20 MG capsule TAKE 1 CAPSULE BY MOUTH 2 (TWO) TIMES DAILY BEFORE A MEAL. Patient not taking: Reported on 12/06/2023 04/21/23   Antonio Baumgarten, NP  ondansetron (ZOFRAN) 8 MG tablet Take 1 tablet (8 mg total) by mouth every 8 (eight) hours as needed for nausea or vomiting. Patient not taking: Reported on 12/06/2023 11/29/23   Gudena,  Clem Currier, MD      Allergies    Metronidazole, Other, and Tape    Review of Systems   Review of Systems  All other systems reviewed and are negative.   Physical Exam Updated Vital Signs BP (!) 75/45   Pulse (!) 110   Temp (!) 92.9 F (33.8 C)   Resp (!) 34   LMP 09/01/2007 (Exact Date)   SpO2 93%  Physical Exam Vitals and nursing note reviewed.  Constitutional:      General: She is in acute distress.     Appearance: She is well-developed. She is ill-appearing.     Comments: Somnolent, mumbling  HENT:     Head: Normocephalic and  atraumatic.     Mouth/Throat:     Mouth: Mucous membranes are dry.     Pharynx: No oropharyngeal exudate.  Eyes:     General: Scleral icterus present.        Right eye: No discharge.        Left eye: No discharge.     Conjunctiva/sclera: Conjunctivae normal.     Pupils: Pupils are equal, round, and reactive to light.  Neck:     Thyroid: No thyromegaly.     Vascular: No JVD.  Cardiovascular:     Rate and Rhythm: Regular rhythm. Tachycardia present.     Heart sounds: Normal heart sounds. No murmur heard.    No friction rub. No gallop.     Comments: Port present in the right upper chest Pulmonary:     Effort: Pulmonary effort is normal. No respiratory distress.     Breath sounds: Normal breath sounds. No wheezing or rales.  Abdominal:     General: Bowel sounds are normal. There is no distension.     Palpations: Abdomen is soft. There is mass.     Tenderness: There is abdominal tenderness.  Musculoskeletal:        General: No tenderness. Normal range of motion.     Cervical back: Normal range of motion and neck supple.     Right lower leg: No edema.     Left lower leg: No edema.  Lymphadenopathy:     Cervical: No cervical adenopathy.  Skin:    General: Skin is warm and dry.     Coloration: Skin is jaundiced.     Findings: No bruising, erythema or rash.  Neurological:     Coordination: Coordination normal.     Comments: Able to move all 4 extremities but somnolent, arousable to voice, severely weak  Psychiatric:        Behavior: Behavior normal.     ED Results / Procedures / Treatments   Labs (all labs ordered are listed, but only abnormal results are displayed) Labs Reviewed  LACTIC ACID, PLASMA - Abnormal; Notable for the following components:      Result Value   Lactic Acid, Venous >9.0 (*)    All other components within normal limits  COMPREHENSIVE METABOLIC PANEL WITH GFR - Abnormal; Notable for the following components:   Sodium 128 (*)    Potassium 5.6 (*)     Chloride 97 (*)    CO2 10 (*)    Glucose, Bld 196 (*)    BUN 59 (*)    Creatinine, Ser 1.40 (*)    Total Protein 4.0 (*)    Albumin 2.1 (*)    AST 165 (*)    ALT 61 (*)    Alkaline Phosphatase 145 (*)    Total Bilirubin 2.8 (*)  GFR, Estimated 41 (*)    Anion gap 21 (*)    All other components within normal limits  CBC WITH DIFFERENTIAL/PLATELET - Abnormal; Notable for the following components:   RBC 1.50 (*)    Hemoglobin 5.3 (*)    HCT 17.0 (*)    MCV 113.3 (*)    MCH 35.3 (*)    RDW 20.7 (*)    Platelets 31 (*)    nRBC 7.0 (*)    nRBC 8 (*)    Abs Immature Granulocytes 1.00 (*)    All other components within normal limits  PROTIME-INR - Abnormal; Notable for the following components:   Prothrombin Time 36.8 (*)    INR 3.7 (*)    All other components within normal limits  AMMONIA - Abnormal; Notable for the following components:   Ammonia 184 (*)    All other components within normal limits  CBG MONITORING, ED - Abnormal; Notable for the following components:   Glucose-Capillary 172 (*)    All other components within normal limits  CBG MONITORING, ED - Abnormal; Notable for the following components:   Glucose-Capillary 172 (*)    All other components within normal limits  RESP PANEL BY RT-PCR (RSV, FLU A&B, COVID)  RVPGX2  CULTURE, BLOOD (ROUTINE X 2)  CULTURE, BLOOD (ROUTINE X 2)  LACTIC ACID, PLASMA  URINALYSIS, W/ REFLEX TO CULTURE (INFECTION SUSPECTED)    EKG None  Radiology DG Chest Port 1 View Result Date: 12/27/2023 CLINICAL DATA:  Questionable sepsis EXAM: PORTABLE CHEST 1 VIEW COMPARISON:  Chest x-ray 09/02/2023 FINDINGS: Right chest port catheter tip ends in the right atrium. Right axillary surgical clips are present. The heart size and mediastinal contours are within normal limits. Both lungs are clear. The visualized skeletal structures are unremarkable. IMPRESSION: No active disease. Electronically Signed   By: Tyron Gallon M.D.   On: 12/28/2023  17:03    Procedures CPR  Date/Time: 12/23/2023 5:05 PM  Performed by: Early Glisson, MD Authorized by: Early Glisson, MD  CPR Procedure Details:      Amount of time prior to administration of ACLS/BLS (minutes):  2   ACLS/BLS initiated by EMS: No     CPR/ACLS performed in the ED: Yes     Duration of CPR (minutes):  15   Outcome: Pt declared dead    CPR performed via ACLS guidelines under my direct supervision.  See RN documentation for details including defibrillator use, medications, doses and timing. .Critical Care  Performed by: Early Glisson, MD Authorized by: Early Glisson, MD   Critical care provider statement:    Critical care time (minutes):  45   Critical care time was exclusive of:  Separately billable procedures and treating other patients and teaching time   Critical care was necessary to treat or prevent imminent or life-threatening deterioration of the following conditions:  Shock   Critical care was time spent personally by me on the following activities:  Development of treatment plan with patient or surrogate, discussions with consultants, evaluation of patient's response to treatment, examination of patient, obtaining history from patient or surrogate, review of old charts, re-evaluation of patient's condition, pulse oximetry, ordering and review of radiographic studies, ordering and review of laboratory studies and ordering and performing treatments and interventions   I assumed direction of critical care for this patient from another provider in my specialty: no   Comments:       Procedure Name: Intubation Date/Time: 12/19/2023 5:06 PM  Performed by: Early Glisson,  MDPre-anesthesia Checklist: Emergency Drugs available, Patient identified and Suction available Oxygen Delivery Method: Ambu bag Preoxygenation: Pre-oxygenation with 100% oxygen Induction Type: Rapid sequence Laryngoscope Size: Mac and 4 Tube size: 7.0 mm Number of attempts: 1 Airway Equipment and  Method: Stylet Placement Confirmation: ETT inserted through vocal cords under direct vision Secured at: 26 cm Dental Injury: Teeth and Oropharynx as per pre-operative assessment  Difficulty Due To: Difficulty was anticipated Comments:          Medications Ordered in ED Medications  lactated ringers infusion (has no administration in time range)  lactated ringers bolus 1,000 mL (has no administration in time range)    And  lactated ringers bolus 1,000 mL (1,000 mLs Intravenous New Bag/Given 12/19/2023 1625)    And  lactated ringers bolus 250 mL (has no administration in time range)  piperacillin-tazobactam (ZOSYN) IVPB 3.375 g (has no administration in time range)  vancomycin (VANCOREADY) IVPB 1500 mg/300 mL (has no administration in time range)  norepinephrine (LEVOPHED) 4mg  in 250mL (0.016 mg/mL) premix infusion (2 mcg/min Intravenous New Bag/Given 12/01/2023 1624)  iohexol (OMNIPAQUE) 350 MG/ML injection 100 mL (has no administration in time range)  succinylcholine (ANECTINE) 200 MG/10ML syringe (has no administration in time range)  etomidate (AMIDATE) 2 MG/ML injection (has no administration in time range)  morphine (PF) 4 MG/ML injection 10 mg (10 mg Intravenous Given 12/03/2023 1705)  LORazepam (ATIVAN) injection 1 mg (1 mg Intravenous Given 12/22/2023 1706)    ED Course/ Medical Decision Making/ A&P                                 Medical Decision Making Amount and/or Complexity of Data Reviewed Labs: ordered. Radiology: ordered.  Risk Prescription drug management.    This patient presents to the ED for concern of syncope, abnormal vital signs, ill-appearing, this involves an extensive number of treatment options, and is a complaint that carries with it a high risk of complications and morbidity.  The differential diagnosis includes sepsis, hyperammonemia, pulmonary embolism   Co morbidities that complicate the patient evaluation  Metastatic cancer   Additional history  obtained:  Additional history obtained from the record External records from outside source obtained and reviewed including prior oncology notes   Lab Tests:  I Ordered, and personally interpreted labs.  The pertinent results include: Hemoglobin of 5.3, it was 11 2 days ago, her chemistry showed that she had creatinine of 1.4 sodium of 128 and a potassium of 5.6 with a CO2 of 10.  Lactic acid over 9, severely acidotic   Imaging Studies ordered:  I ordered imaging studies including chest x-ray I independently visualized and interpreted imaging which showed no acute findings I agree with the radiologist interpretation   Cardiac Monitoring: / EKG:  The patient was maintained on a cardiac monitor.  I personally viewed and interpreted the cardiac monitored which showed an underlying rhythm of: Sinus tachycardia, hypotensive   Problem List / ED Course / Critical interventions / Medication management  This patient presented severely hypotensive altered and in acute distress with multiple abnormal findings including severe elevation in ammonia, liver failure, bilirubin was elevated, severe anemia which appeared to be an acute GI bleed I ordered medication including Levophed, IV fluid resuscitation for shock Reevaluation of the patient after these medicines showed that the patient the patient continued to decompensate and ultimately had a cardiac arrest in the emergency department I have reviewed the patients  home medicines and have made adjustments as needed  CODE BLUE was activated as the patient had a cardiac arrest just prior to going to CT scan to look for causes of her symptoms.  During this time in the cardiac arrest she vomited voluminous amounts of coffee-ground emesis, she had no pulses, no return of circulation despite multiple rounds of CPR and epinephrine, and intubation, she did have some transient return of circulation however after discussing this with family they have requested  that resuscitative efforts be discontinued.  The patient was given morphine, Ativan, extubated at their request and expired at 4:57 PM.    Social Determinants of Health:  Gastric cancer, multiple cancers   Test / Admission - Considered:  Expired, I will sign the death certificate         Final Clinical Impression(s) / ED Diagnoses Final diagnoses:  Upper GI bleed  Cardiac arrest [facility])    Rx / DC Orders ED Discharge Orders     None         Early Glisson, MD 12/22/2023 1707

## 2023-12-30 NOTE — Code Documentation (Signed)
Patient time of death occurred at 1658 

## 2023-12-30 NOTE — ED Notes (Signed)
 Pt was being prepared for transport to CT. She stated "I need to sit up" and became very anxious. As she was attempting to sit up she began to vomit copious amounts coffee-ground appearing emesis. Suction was quickly set up and we attempted to keep the patient's airway clear. She became unresponsive, pulseless, and apneic just after the vomiting stopped. Code blue was called as the patient was moved into position to being chest compressions.

## 2023-12-30 NOTE — Sepsis Progress Note (Signed)
 Sepsis protocol is being followed by [facility].

## 2023-12-30 NOTE — ED Notes (Signed)
 Date and time results received: 12/18/2023 4:58 PM  (use smartphrase ".now" to insert current time)  Test: hgb  Critical Value: 5.3  Name of Provider Notified: Dr. Annabell Key  Orders Received? Or Actions Taken?:

## 2023-12-30 DEATH — deceased

## 2024-01-03 ENCOUNTER — Other Ambulatory Visit

## 2024-01-03 ENCOUNTER — Ambulatory Visit: Admitting: Hematology and Oncology

## 2024-01-03 ENCOUNTER — Ambulatory Visit

## 2024-01-30 NOTE — Progress Notes (Signed)
 Medically justified for investigation of illness
